# Patient Record
Sex: Male | Born: 1955 | Race: White | Hispanic: No | Smoking: Former smoker
Health system: Southern US, Community
[De-identification: ages and names within clinical notes are randomized; demographics above are authoritative.]

## PROBLEM LIST (undated history)

## (undated) DIAGNOSIS — E785 Hyperlipidemia, unspecified: Secondary | ICD-10-CM

## (undated) DIAGNOSIS — I472 Ventricular tachycardia, unspecified: Secondary | ICD-10-CM

## (undated) DIAGNOSIS — I4729 Other ventricular tachycardia: Secondary | ICD-10-CM

## (undated) DIAGNOSIS — I10 Essential (primary) hypertension: Secondary | ICD-10-CM

## (undated) DIAGNOSIS — Z95810 Presence of automatic (implantable) cardiac defibrillator: Secondary | ICD-10-CM

## (undated) DIAGNOSIS — I255 Ischemic cardiomyopathy: Secondary | ICD-10-CM

## (undated) DIAGNOSIS — I4510 Unspecified right bundle-branch block: Secondary | ICD-10-CM

## (undated) DIAGNOSIS — Z8679 Personal history of other diseases of the circulatory system: Secondary | ICD-10-CM

## (undated) DIAGNOSIS — I493 Ventricular premature depolarization: Secondary | ICD-10-CM

## (undated) DIAGNOSIS — R0600 Dyspnea, unspecified: Secondary | ICD-10-CM

## (undated) DIAGNOSIS — I219 Acute myocardial infarction, unspecified: Secondary | ICD-10-CM

## (undated) DIAGNOSIS — I5022 Chronic systolic (congestive) heart failure: Secondary | ICD-10-CM

## (undated) DIAGNOSIS — F419 Anxiety disorder, unspecified: Secondary | ICD-10-CM

## (undated) DIAGNOSIS — G459 Transient cerebral ischemic attack, unspecified: Secondary | ICD-10-CM

## (undated) DIAGNOSIS — F32A Depression, unspecified: Secondary | ICD-10-CM

## (undated) DIAGNOSIS — F329 Major depressive disorder, single episode, unspecified: Secondary | ICD-10-CM

## (undated) DIAGNOSIS — I2510 Atherosclerotic heart disease of native coronary artery without angina pectoris: Secondary | ICD-10-CM

## (undated) HISTORY — DX: Hyperlipidemia, unspecified: E78.5

## (undated) HISTORY — PX: CARDIAC DEFIBRILLATOR PLACEMENT: SHX171

## (undated) HISTORY — DX: Unspecified right bundle-branch block: I45.10

## (undated) HISTORY — DX: Other ventricular tachycardia: I47.29

## (undated) HISTORY — DX: Essential (primary) hypertension: I10

## (undated) HISTORY — DX: Transient cerebral ischemic attack, unspecified: G45.9

## (undated) HISTORY — DX: Atherosclerotic heart disease of native coronary artery without angina pectoris: I25.10

## (undated) HISTORY — PX: LAPAROSCOPIC CHOLECYSTECTOMY: SUR755

## (undated) HISTORY — DX: Ventricular premature depolarization: I49.3

## (undated) HISTORY — DX: Ventricular tachycardia, unspecified: I47.20

## (undated) HISTORY — DX: Ischemic cardiomyopathy: I25.5

## (undated) HISTORY — PX: HERNIA REPAIR: SHX51

## (undated) HISTORY — DX: Ventricular tachycardia: I47.2

## (undated) HISTORY — DX: Anxiety disorder, unspecified: F41.9

---
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
## 1998-07-11 ENCOUNTER — Encounter: Payer: Self-pay | Admitting: Emergency Medicine

## 1998-07-11 ENCOUNTER — Inpatient Hospital Stay (HOSPITAL_COMMUNITY): Admission: EM | Admit: 1998-07-11 | Discharge: 1998-07-11 | Payer: Self-pay | Admitting: Emergency Medicine

## 2001-02-06 ENCOUNTER — Inpatient Hospital Stay (HOSPITAL_COMMUNITY): Admission: EM | Admit: 2001-02-06 | Discharge: 2001-02-16 | Payer: Self-pay | Admitting: *Deleted

## 2001-02-06 ENCOUNTER — Encounter: Payer: Self-pay | Admitting: *Deleted

## 2001-02-07 ENCOUNTER — Encounter: Payer: Self-pay | Admitting: *Deleted

## 2001-02-08 ENCOUNTER — Encounter: Payer: Self-pay | Admitting: *Deleted

## 2001-02-09 ENCOUNTER — Encounter: Payer: Self-pay | Admitting: *Deleted

## 2001-02-13 ENCOUNTER — Encounter: Payer: Self-pay | Admitting: *Deleted

## 2001-06-17 ENCOUNTER — Observation Stay (HOSPITAL_COMMUNITY): Admission: EM | Admit: 2001-06-17 | Discharge: 2001-06-18 | Payer: Self-pay | Admitting: *Deleted

## 2001-08-16 ENCOUNTER — Ambulatory Visit (HOSPITAL_COMMUNITY): Admission: RE | Admit: 2001-08-16 | Discharge: 2001-08-16 | Payer: Self-pay | Admitting: *Deleted

## 2002-03-04 ENCOUNTER — Inpatient Hospital Stay (HOSPITAL_COMMUNITY): Admission: EM | Admit: 2002-03-04 | Discharge: 2002-03-07 | Payer: Self-pay | Admitting: Emergency Medicine

## 2002-03-04 ENCOUNTER — Encounter: Payer: Self-pay | Admitting: Emergency Medicine

## 2002-03-23 ENCOUNTER — Inpatient Hospital Stay (HOSPITAL_COMMUNITY): Admission: EM | Admit: 2002-03-23 | Discharge: 2002-03-24 | Payer: Self-pay | Admitting: Emergency Medicine

## 2002-04-12 ENCOUNTER — Ambulatory Visit (HOSPITAL_COMMUNITY): Admission: RE | Admit: 2002-04-12 | Discharge: 2002-04-13 | Payer: Self-pay | Admitting: Internal Medicine

## 2002-04-13 ENCOUNTER — Encounter: Payer: Self-pay | Admitting: Internal Medicine

## 2002-04-14 ENCOUNTER — Emergency Department (HOSPITAL_COMMUNITY): Admission: EM | Admit: 2002-04-14 | Discharge: 2002-04-14 | Payer: Self-pay | Admitting: Emergency Medicine

## 2003-11-12 ENCOUNTER — Inpatient Hospital Stay (HOSPITAL_COMMUNITY): Admission: EM | Admit: 2003-11-12 | Discharge: 2003-11-14 | Payer: Self-pay | Admitting: Emergency Medicine

## 2004-01-11 ENCOUNTER — Ambulatory Visit: Payer: Self-pay | Admitting: Cardiology

## 2004-01-31 ENCOUNTER — Ambulatory Visit: Payer: Self-pay | Admitting: Internal Medicine

## 2004-01-31 ENCOUNTER — Ambulatory Visit: Payer: Self-pay

## 2004-03-01 ENCOUNTER — Emergency Department (HOSPITAL_COMMUNITY): Admission: EM | Admit: 2004-03-01 | Discharge: 2004-03-01 | Payer: Self-pay | Admitting: Emergency Medicine

## 2004-04-28 ENCOUNTER — Ambulatory Visit: Payer: Self-pay

## 2004-05-05 ENCOUNTER — Ambulatory Visit: Payer: Self-pay

## 2004-05-13 ENCOUNTER — Ambulatory Visit: Payer: Self-pay | Admitting: Internal Medicine

## 2004-09-16 ENCOUNTER — Inpatient Hospital Stay (HOSPITAL_COMMUNITY): Admission: EM | Admit: 2004-09-16 | Discharge: 2004-09-19 | Payer: Self-pay | Admitting: Emergency Medicine

## 2004-09-16 ENCOUNTER — Ambulatory Visit: Payer: Self-pay | Admitting: Cardiology

## 2004-10-23 ENCOUNTER — Inpatient Hospital Stay (HOSPITAL_COMMUNITY): Admission: AD | Admit: 2004-10-23 | Discharge: 2004-10-24 | Payer: Self-pay | Admitting: Internal Medicine

## 2004-10-23 ENCOUNTER — Ambulatory Visit: Payer: Self-pay | Admitting: Cardiology

## 2004-10-23 ENCOUNTER — Ambulatory Visit: Payer: Self-pay | Admitting: Internal Medicine

## 2004-10-24 ENCOUNTER — Ambulatory Visit: Payer: Self-pay | Admitting: Cardiology

## 2004-11-11 ENCOUNTER — Ambulatory Visit: Payer: Self-pay | Admitting: Cardiology

## 2004-12-31 ENCOUNTER — Ambulatory Visit: Payer: Self-pay | Admitting: Cardiology

## 2005-02-06 ENCOUNTER — Ambulatory Visit: Payer: Self-pay | Admitting: Cardiology

## 2005-04-08 ENCOUNTER — Ambulatory Visit: Payer: Self-pay | Admitting: Internal Medicine

## 2005-07-11 ENCOUNTER — Inpatient Hospital Stay (HOSPITAL_COMMUNITY): Admission: EM | Admit: 2005-07-11 | Discharge: 2005-07-12 | Payer: Self-pay | Admitting: Emergency Medicine

## 2005-07-11 ENCOUNTER — Ambulatory Visit: Payer: Self-pay | Admitting: Cardiology

## 2005-07-17 ENCOUNTER — Ambulatory Visit: Payer: Self-pay | Admitting: Cardiology

## 2005-08-11 ENCOUNTER — Ambulatory Visit: Payer: Self-pay | Admitting: Cardiology

## 2005-08-11 ENCOUNTER — Encounter (HOSPITAL_COMMUNITY): Admission: RE | Admit: 2005-08-11 | Discharge: 2005-11-05 | Payer: Self-pay | Admitting: Cardiology

## 2005-09-29 ENCOUNTER — Ambulatory Visit: Payer: Self-pay | Admitting: Cardiology

## 2005-09-29 ENCOUNTER — Inpatient Hospital Stay (HOSPITAL_COMMUNITY): Admission: EM | Admit: 2005-09-29 | Discharge: 2005-09-29 | Payer: Self-pay | Admitting: Emergency Medicine

## 2005-10-21 ENCOUNTER — Ambulatory Visit: Payer: Self-pay | Admitting: Internal Medicine

## 2005-11-11 ENCOUNTER — Ambulatory Visit: Payer: Self-pay | Admitting: Cardiology

## 2005-12-31 ENCOUNTER — Ambulatory Visit: Payer: Self-pay | Admitting: Cardiology

## 2006-05-07 ENCOUNTER — Ambulatory Visit: Payer: Self-pay | Admitting: Cardiology

## 2006-05-24 ENCOUNTER — Ambulatory Visit: Payer: Self-pay | Admitting: Cardiology

## 2006-05-24 LAB — CONVERTED CEMR LAB
BUN: 12 mg/dL (ref 6–23)
Chloride: 106 meq/L (ref 96–112)
GFR calc non Af Amer: 74 mL/min
Potassium: 3.9 meq/L (ref 3.5–5.1)
Sodium: 139 meq/L (ref 135–145)

## 2006-06-21 ENCOUNTER — Ambulatory Visit: Payer: Self-pay | Admitting: Internal Medicine

## 2006-06-21 LAB — CONVERTED CEMR LAB
ALT: 21 units/L (ref 0–40)
Albumin: 3.8 g/dL (ref 3.5–5.2)
HDL: 34.6 mg/dL — ABNORMAL LOW (ref >39.0)
Triglycerides: 193 mg/dL — ABNORMAL HIGH (ref 0–149)
VLDL: 39 mg/dL (ref 0–40)

## 2006-09-01 ENCOUNTER — Ambulatory Visit: Payer: Self-pay | Admitting: Internal Medicine

## 2006-12-13 ENCOUNTER — Ambulatory Visit: Payer: Self-pay | Admitting: Internal Medicine

## 2007-03-16 ENCOUNTER — Ambulatory Visit: Payer: Self-pay | Admitting: Internal Medicine

## 2007-04-30 ENCOUNTER — Ambulatory Visit: Payer: Self-pay | Admitting: Cardiology

## 2007-04-30 ENCOUNTER — Inpatient Hospital Stay (HOSPITAL_COMMUNITY): Admission: EM | Admit: 2007-04-30 | Discharge: 2007-05-03 | Payer: Self-pay | Admitting: Emergency Medicine

## 2007-05-02 ENCOUNTER — Encounter (INDEPENDENT_AMBULATORY_CARE_PROVIDER_SITE_OTHER): Payer: Self-pay | Admitting: Cardiology

## 2007-05-20 ENCOUNTER — Ambulatory Visit: Payer: Self-pay | Admitting: Cardiology

## 2007-06-08 ENCOUNTER — Ambulatory Visit: Payer: Self-pay | Admitting: Internal Medicine

## 2007-06-08 LAB — CONVERTED CEMR LAB
Calcium: 9.6 mg/dL (ref 8.4–10.5)
Creatinine, Ser: 1.1 mg/dL (ref 0.4–1.5)
GFR calc non Af Amer: 74 mL/min
Glucose, Bld: 92 mg/dL (ref 70–99)
Potassium: 4.3 meq/L (ref 3.5–5.1)
Sodium: 140 meq/L (ref 135–145)

## 2007-06-14 ENCOUNTER — Ambulatory Visit: Payer: Self-pay | Admitting: Cardiology

## 2007-06-14 LAB — CONVERTED CEMR LAB
AST: 21 units/L (ref 0–37)
Albumin: 3.8 g/dL (ref 3.5–5.2)
Bilirubin, Direct: 0.1 mg/dL (ref 0.0–0.3)
Calcium: 9.3 mg/dL (ref 8.4–10.5)
Chloride: 109 meq/L (ref 96–112)
GFR calc non Af Amer: 83 mL/min
Glucose, Bld: 100 mg/dL — ABNORMAL HIGH (ref 70–99)
HDL: 40.8 mg/dL (ref >39.0)
Potassium: 4.2 meq/L (ref 3.5–5.1)
Total Bilirubin: 0.7 mg/dL (ref 0.3–1.2)
Total CHOL/HDL Ratio: 4

## 2008-01-10 ENCOUNTER — Ambulatory Visit: Payer: Self-pay | Admitting: Internal Medicine

## 2008-02-24 ENCOUNTER — Ambulatory Visit: Payer: Self-pay | Admitting: *Deleted

## 2008-02-24 ENCOUNTER — Inpatient Hospital Stay (HOSPITAL_COMMUNITY): Admission: EM | Admit: 2008-02-24 | Discharge: 2008-02-25 | Payer: Self-pay | Admitting: Emergency Medicine

## 2008-02-29 ENCOUNTER — Ambulatory Visit: Payer: Self-pay | Admitting: Cardiology

## 2008-03-27 ENCOUNTER — Encounter: Payer: Self-pay | Admitting: Internal Medicine

## 2008-04-10 ENCOUNTER — Ambulatory Visit: Payer: Self-pay | Admitting: Internal Medicine

## 2008-06-04 DIAGNOSIS — I255 Ischemic cardiomyopathy: Secondary | ICD-10-CM | POA: Insufficient documentation

## 2008-06-04 DIAGNOSIS — F411 Generalized anxiety disorder: Secondary | ICD-10-CM

## 2008-06-04 DIAGNOSIS — I10 Essential (primary) hypertension: Secondary | ICD-10-CM

## 2008-06-04 DIAGNOSIS — E785 Hyperlipidemia, unspecified: Secondary | ICD-10-CM

## 2008-06-04 DIAGNOSIS — R079 Chest pain, unspecified: Secondary | ICD-10-CM | POA: Insufficient documentation

## 2008-07-09 ENCOUNTER — Encounter (INDEPENDENT_AMBULATORY_CARE_PROVIDER_SITE_OTHER): Payer: Self-pay | Admitting: *Deleted

## 2008-07-16 ENCOUNTER — Telehealth: Payer: Self-pay | Admitting: Cardiology

## 2008-08-13 ENCOUNTER — Telehealth: Payer: Self-pay | Admitting: Internal Medicine

## 2008-08-21 ENCOUNTER — Ambulatory Visit: Payer: Self-pay | Admitting: Internal Medicine

## 2008-08-21 DIAGNOSIS — I472 Ventricular tachycardia: Secondary | ICD-10-CM

## 2008-09-03 ENCOUNTER — Telehealth: Payer: Self-pay | Admitting: Cardiology

## 2008-09-07 ENCOUNTER — Encounter: Payer: Self-pay | Admitting: Internal Medicine

## 2008-09-07 ENCOUNTER — Ambulatory Visit (HOSPITAL_COMMUNITY): Admission: RE | Admit: 2008-09-07 | Discharge: 2008-09-07 | Payer: Self-pay | Admitting: Internal Medicine

## 2008-09-10 ENCOUNTER — Encounter: Payer: Self-pay | Admitting: Internal Medicine

## 2008-09-27 ENCOUNTER — Ambulatory Visit: Payer: Self-pay | Admitting: Internal Medicine

## 2008-09-28 LAB — CONVERTED CEMR LAB
BUN: 15 mg/dL (ref 6–23)
CO2: 30 meq/L (ref 19–32)
Creatinine, Ser: 1.1 mg/dL (ref 0.4–1.5)
GFR calc non Af Amer: 73.71 mL/min (ref >60)
HCT: 39.8 % (ref 39.0–52.0)
Potassium: 4.9 meq/L (ref 3.5–5.1)
RDW: 12.9 % (ref 11.5–14.6)
Sodium: 141 meq/L (ref 135–145)
WBC: 5.6 10*3/uL (ref 4.5–10.5)

## 2008-10-03 ENCOUNTER — Ambulatory Visit (HOSPITAL_COMMUNITY): Admission: RE | Admit: 2008-10-03 | Discharge: 2008-10-03 | Payer: Self-pay | Admitting: Internal Medicine

## 2008-10-03 ENCOUNTER — Ambulatory Visit: Payer: Self-pay | Admitting: Internal Medicine

## 2008-10-04 ENCOUNTER — Encounter: Payer: Self-pay | Admitting: Internal Medicine

## 2008-10-22 ENCOUNTER — Ambulatory Visit: Payer: Self-pay

## 2008-10-22 ENCOUNTER — Encounter: Payer: Self-pay | Admitting: Internal Medicine

## 2009-01-14 ENCOUNTER — Telehealth: Payer: Self-pay | Admitting: Cardiology

## 2009-02-04 ENCOUNTER — Encounter: Payer: Self-pay | Admitting: Internal Medicine

## 2009-02-05 ENCOUNTER — Ambulatory Visit: Payer: Self-pay | Admitting: Internal Medicine

## 2009-05-07 ENCOUNTER — Ambulatory Visit: Payer: Self-pay | Admitting: Internal Medicine

## 2009-05-09 ENCOUNTER — Encounter: Payer: Self-pay | Admitting: Internal Medicine

## 2009-05-15 ENCOUNTER — Encounter: Payer: Self-pay | Admitting: Internal Medicine

## 2009-07-01 ENCOUNTER — Telehealth: Payer: Self-pay | Admitting: Cardiology

## 2009-08-09 ENCOUNTER — Encounter: Payer: Self-pay | Admitting: Internal Medicine

## 2009-09-26 ENCOUNTER — Telehealth: Payer: Self-pay | Admitting: Cardiology

## 2009-11-13 ENCOUNTER — Telehealth: Payer: Self-pay | Admitting: Cardiology

## 2009-11-22 ENCOUNTER — Encounter (INDEPENDENT_AMBULATORY_CARE_PROVIDER_SITE_OTHER): Payer: Self-pay | Admitting: *Deleted

## 2010-02-04 ENCOUNTER — Ambulatory Visit: Payer: Self-pay | Admitting: Cardiology

## 2010-02-04 ENCOUNTER — Encounter: Payer: Self-pay | Admitting: Cardiology

## 2010-02-04 DIAGNOSIS — F17200 Nicotine dependence, unspecified, uncomplicated: Secondary | ICD-10-CM

## 2010-02-06 LAB — CONVERTED CEMR LAB
ALT: 23 units/L (ref 0–53)
BUN: 17 mg/dL (ref 6–23)
Bilirubin, Direct: 0 mg/dL (ref 0.0–0.3)
Calcium: 9.3 mg/dL (ref 8.4–10.5)
Chloride: 104 meq/L (ref 96–112)
Cholesterol: 243 mg/dL — ABNORMAL HIGH (ref 0–200)
Creatinine, Ser: 1.2 mg/dL (ref 0.4–1.5)
GFR calc non Af Amer: 66.99 mL/min (ref >60.00)
HDL: 42.3 mg/dL (ref >39.00)
Sodium: 138 meq/L (ref 135–145)
Total Protein: 7.4 g/dL (ref 6.0–8.3)

## 2010-03-16 ENCOUNTER — Encounter: Payer: Self-pay | Admitting: Cardiology

## 2010-03-25 ENCOUNTER — Ambulatory Visit
Admission: RE | Admit: 2010-03-25 | Discharge: 2010-03-25 | Payer: Self-pay | Source: Home / Self Care | Attending: Internal Medicine | Admitting: Internal Medicine

## 2010-03-25 ENCOUNTER — Encounter: Payer: Self-pay | Admitting: Internal Medicine

## 2010-03-25 NOTE — Letter (Signed)
Summary: Device-Delinquent Phone Journalist, newspaper, Main Office  [address]   [place], [place] [address]   Phone: [phone]  Fax: [phone]     August 09, 2009 MRN: [id number]   Ronit Hustead [address] [place], [place]  [address]   Dear Mr. RODRIGUE,  According to our records, you were scheduled for a device phone transmission on 08-06-2009.     We did not receive any results from this check.  If you transmitted on your scheduled day, please call us to help troubleshoot your system.  If you forgot to send your transmission, please send one upon receipt of this letter.  Thank you,   Architectural technologist [facility]

## 2010-03-25 NOTE — Letter (Signed)
Summary: Remote Device Check  [employer], Main Office  [address]   [place], [place] [address]   Phone: [phone]  Fax: [phone]     May 15, 2009 MRN: [id number]   [name] [address] [place], [place]  [address]   Dear Mr. [name],   Your remote transmission was recieved and reviewed by your physician.  All diagnostics were within normal limits for you.  __X___Your next transmission is scheduled for:    August 06, 2009.  Please transmit at any time this day.  If you have a wireless device your transmission will be sent automatically.      Sincerely,  Proofreader

## 2010-03-25 NOTE — Letter (Signed)
Summary: Device-Delinquent Check  [facility], Main Office  [address]   [place], [place] [address]   Phone: [phone]  Fax: [phone]     November 22, 2009 MRN: [id number]   [name] [address] [place], [place]  [address]   Dear Mr. TA,  According to our records, you have not had your implanted device checked in the recommended period of time.  We are unable to determine appropriate device function without checking your device on a regular basis.  Please call our office to schedule an appointment  with Dr Graciela Husbands, as soon as possible.  If you are having your device checked by another physician, please call us so that we may update our records.  Thank you,   Architectural technologist [facility]

## 2010-03-25 NOTE — Progress Notes (Signed)
Summary: Calling regarding Plavis  Phone Note Call from Patient Call back at Home Phone [phone] Call back at [phone]   Caller: Patient Summary of Call: Pt calling regarding medication Plavix Initial call taken by: Judie Grieve,  Jul 01, 2009 4:16 PM  Follow-up for Phone Call        spoke with pt, plavix ordered from [facility] Deliah Goody, RN  Jul 01, 2009 6:22 PM      Appended Document: Calling regarding Plavis plavix received from [facility] pt aware to pick up at the front desk

## 2010-03-25 NOTE — Progress Notes (Signed)
Summary: pt needs refill  Phone Note Refill Request Call back at Home Phone [phone] Message from:  Patient on [facility] in [place] [phone]  Refills Requested: Medication #1:  CARVEDILOL 12.5 MG TABS take two times a day  Medication #2:  LASIX 20 MG TABS Take 1 tablet by mouth once a day  Medication #3:  LISINOPRIL 20 MG TABS 1 tab once daily simvastatin needs to be called into [facility] [address] in [place]  Initial call taken by: Omer Jack,  November 13, 2009 3:43 PM  Follow-up for Phone Call        called meds in to pharmacy gave pt 12 refills on all meds and called pt to let him know these was done for him Follow-up by: Kem Parkinson,  November 13, 2009 4:36 PM

## 2010-03-25 NOTE — Progress Notes (Signed)
Summary: refill meds  Phone Note Refill Request Call back at Home Phone [phone] Message from:  Patient on September 26, 2009 4:59 PM  Refills Requested: Medication #1:  NITROGLYCERIN 0.4 MG SUBL One tablet under tongue every 5 minutes as needed for chest pain---may repeat times three [facility] [place] [phone]   Method Requested: Fax to Local Pharmacy Initial call taken by: Lorne Skeens,  September 26, 2009 5:00 PM    Prescriptions: NITROGLYCERIN 0.4 MG SUBL (NITROGLYCERIN) One tablet under tongue every 5 minutes as needed for chest pain---may repeat times three  #25 x 12   Entered by:   Kem Parkinson   Authorized by:   Ferman Hamming, MD, [facility]   Signed by:   Kem Parkinson on 09/27/2009   Method used:   Electronically to        [facility] [phone]* (retail)       [address]       [place], [place]  [address]       Ph: [phone] or [phone]       Fax: [phone]   RxID:   [id number] NITROGLYCERIN 0.4 MG SUBL (NITROGLYCERIN) One tablet under tongue every 5 minutes as needed for chest pain---may repeat times three  #30 x 12   Entered by:   Kem Parkinson   Authorized by:   Ferman Hamming, MD, [facility]   Signed by:   Kem Parkinson on 09/27/2009   Method used:   Electronically to        [facility] [address] (retail)       [address]       [place], [place]  [address]       Ph: [phone] or [phone]       Fax: [phone]   RxID:   [id number]

## 2010-03-25 NOTE — Cardiovascular Report (Signed)
Summary: Office Visit Remote  Office Visit Remote   Imported By: Roderic Ovens 05/15/2009 16:18:30  _____________________________________________________________________  External Attachment:    Type:   Image     Comment:   External Document

## 2010-03-27 NOTE — Assessment & Plan Note (Signed)
Summary: rov per spouse call/lg   Referring Provider:  brian crenshaw   History of Present Illness: [name] is a pleasant gentleman who has a history of coronary artery disease, ischemic cardiomyopathy, and history of ICD.  His most recent cardiac catheterization was performed in March 2009.  At that time, he was found to have an 80% stenosis in the proximal LAD.  There is a second diagonal with a 70% ostial stenosis.  There was a 50% circumflex and a small first OM branch had an 80-90% stenosis.  There was a 50% distal stenosis in the RCA, but the previous stent was patent.  He had a PCI of his LAD at that time with a bare-metal stent.  Note, the ejection fraction is 30-35%. I last saw him in January of 2010. Since then, the patient denies any dyspnea on exertion, orthopnea, PND, pedal edema, palpitations, syncope or chest pain.    Impression & Recommendations:  Problem # 1:  ICD - MDT MASTER STUDY (ICD-V45.02) Management of ICD per electrophysiology.  Problem # 2:  CARDIOMYOPATHY, ISCHEMIC (ICD-414.8) Continue ACE inhibitor, beta blocker and diuretic. Check potassium and renal function. His updated medication list for this problem includes:    Carvedilol 12.5 Mg Tabs (Carvedilol) .[name]... Take two times a day    Lasix 20 Mg Tabs (Furosemide) .[name]... Take 1 tablet by mouth once a day    Plavix 75 Mg Tabs (Clopidogrel bisulfate) .[name]... Take 1 tablet by mouth once a day    Nitroglycerin 0.4 Mg Subl (Nitroglycerin) ..... One tablet under tongue every 5 minutes as needed for chest pain---may repeat times three    Lisinopril 20 Mg Tabs (Lisinopril) .[name]... 1 tab once daily    Aspirin Ec 325 Mg Tbec (Aspirin) .[name]... Take one tablet by mouth daily  Problem # 3:  HYPERTENSION, UNSPECIFIED (ICD-401.9)  His updated medication list for this problem includes:    Carvedilol 12.5 Mg Tabs (Carvedilol) .[name]... Take two times a day    Lasix 20 Mg Tabs (Furosemide) .[name]... Take 1 tablet by mouth once a day  Lisinopril 20 Mg Tabs (Lisinopril) .[name]... 1 tab once daily    Aspirin Ec 325 Mg Tbec (Aspirin) .[name]... Take one tablet by mouth daily  Orders: TLB-BMP (Basic Metabolic Panel-BMET) (80048-METABOL)  His updated medication list for this problem includes:    Carvedilol 12.5 Mg Tabs (Carvedilol) .[name]... Take two times a day    Lasix 20 Mg Tabs (Furosemide) .[name]... Take 1 tablet by mouth once a day    Lisinopril 20 Mg Tabs (Lisinopril) .[name]... 1 tab once daily    Aspirin Ec 325 Mg Tbec (Aspirin) .[name]... Take one tablet by mouth daily  Problem # 4:  HYPERLIPIDEMIA-MIXED (ICD-272.4) Continue statin. Check CK, lipids and liver. His updated medication list for this problem includes:    Simvastatin 80 Mg Tabs (Simvastatin) .[name]... 1 tab once daily  Orders: TLB-CK Total Only(Creatine Kinase/CPK) (82550-CK) TLB-Lipid Panel (80061-LIPID) TLB-Hepatic/Liver Function Pnl (80076-HEPATIC)  Problem # 5:  CAD (ICD-414.00) Continue aspirin continue Plavix. Continue beta blocker, ACE inhibitor and statin. The following medications were removed from the medication list:    Plavix 75 Mg Tabs (Clopidogrel bisulfate) .[name]... Take 1 tablet by mouth once a day His updated medication list for this problem includes:    Carvedilol 12.5 Mg Tabs (Carvedilol) .[name]... Take two times a day    Nitroglycerin 0.4 Mg Subl (Nitroglycerin) ..... One tablet under tongue every 5 minutes as needed for chest pain---may repeat times three  Lisinopril 20 Mg Tabs (Lisinopril) .[name]... 1 tab once daily    Aspirin Ec 325 Mg Tbec (Aspirin) .[name]... Take one tablet by mouth daily  Problem # 6:  TOBACCO ABUSE (ICD-305.1) Patient counseled on discontinuing.   Current Medications (verified): 1)  Carvedilol 12.5 Mg Tabs (Carvedilol) .... Take Two Times A Day 2)  Lasix 20 Mg Tabs (Furosemide) .... Take 1 Tablet By Mouth Once A Day 3)  Simvastatin 80 Mg Tabs (Simvastatin) .[name].. 1 Tab Once Daily 4)  Plavix 75 Mg Tabs (Clopidogrel Bisulfate) .... Take 1  Tablet By Mouth Once A Day 5)  Nitroglycerin 0.4 Mg Subl (Nitroglycerin) .... One Tablet Under Tongue Every 5 Minutes As Needed For Chest Pain---May Repeat Times Three 6)  Lisinopril 20 Mg Tabs (Lisinopril) .[name].. 1 Tab Once Daily 7)  Aspirin Ec 325 Mg Tbec (Aspirin) .... Take One Tablet By Mouth Daily  Allergies: No Known Drug Allergies  Past History:  Past Medical History: ICD - IN SITU (ICD-V45.02) [employer] HYPERTENSION, UNSPECIFIED (ICD-401.9) HYPERLIPIDEMIA-MIXED (ICD-272.4) CARDIOMYOPATHY, ISCHEMIC (ICD-414.8) CAD, UNSPECIFIED SITE (ICD-414.00) ANXIETY (ICD-300.00)    Past Surgical History: Reviewed history from 02/04/2009 and no changes required. Laparoscopic cholecystectomy. Defibrillator implantation with primary prevention for sudden       cardiac death, (Master-P). -- [name], M.D. - 04/12/2002 Explantation of a previously implanted device.-2010 Implantation of a new device--[employer]  Social History: Reviewed history from 06/04/2008 and no changes required. Full Time Married  Tobacco Use - Yes.  Alcohol Use - yes -- ocassional Drug Use - no  Review of Systems       no fevers or chills, productive cough, hemoptysis, dysphasia, odynophagia, melena, hematochezia, dysuria, hematuria, rash, seizure activity, orthopnea, PND, pedal edema, claudication. Remaining systems are negative.   Vital Signs:  Patient profile:   55 year old male Height:      71 inches Weight:      233 pounds BMI:     32.61 Pulse rate:   94 / minute Resp:     14 per minute BP sitting:   112 / 72  (left arm)  Vitals Entered By: Kem Parkinson (February 04, 2010 4:07 PM)  Physical Exam  General:  Well-developed well-nourished in no acute distress.  Skin is warm and dry.  HEENT is normal.  Neck is supple. No thyromegaly.  Chest is clear to auscultation with normal expansion.  Cardiovascular exam is regular rate and rhythm.  Abdominal exam nontender or  distended. No masses palpated. Extremities show no edema. neuro grossly intact    EKG  Procedure date:  02/04/2010  Findings:      Sinus rhythm, left axis deviation, nonspecific ST changes.   ICD Specifications Following MD:  Sherryl Manges, MD     ICD Vendor:  Medtronic     ICD Model Number:  [id number]     ICD Serial Number:  [id number] ICD DOI:  10/03/2008     ICD Implanting MD:  Sherryl Manges, MD  Lead 1:    Location: RV     DOI: 04/12/2002     Model #: [id number]     Serial #: [id number]     Status: active  Indications::  ICM  Explantation Comments: 10/03/2008  Maximo [id number] S explanted  ICD Follow Up ICD Dependent:  No      Episodes Coumadin:  No  Brady Parameters Mode VVI     Lower Rate Limit:  40      Tachy Zones VF:  200  VT:  167     Impression & Recommendations:  Problem # 1:  ICD - MDT MASTER STUDY (ICD-V45.02) Management of ICD per electrophysiology.  Problem # 2:  CARDIOMYOPATHY, ISCHEMIC (ICD-414.8) Continue ACE inhibitor, beta blocker and diuretic. Check potassium and renal function. His updated medication list for this problem includes:    Carvedilol 12.5 Mg Tabs (Carvedilol) .[name]... Take two times a day    Lasix 20 Mg Tabs (Furosemide) .[name]... Take 1 tablet by mouth once a day    Plavix 75 Mg Tabs (Clopidogrel bisulfate) .[name]... Take 1 tablet by mouth once a day    Nitroglycerin 0.4 Mg Subl (Nitroglycerin) ..... One tablet under tongue every 5 minutes as needed for chest pain---may repeat times three    Lisinopril 20 Mg Tabs (Lisinopril) .[name]... 1 tab once daily    Aspirin Ec 325 Mg Tbec (Aspirin) .[name]... Take one tablet by mouth daily  Problem # 3:  HYPERTENSION, UNSPECIFIED (ICD-401.9)  His updated medication list for this problem includes:    Carvedilol 12.5 Mg Tabs (Carvedilol) .[name]... Take two times a day    Lasix 20 Mg Tabs (Furosemide) .[name]... Take 1 tablet by mouth once a day    Lisinopril 20 Mg Tabs (Lisinopril) .[name]... 1 tab once daily    Aspirin Ec 325  Mg Tbec (Aspirin) .[name]... Take one tablet by mouth daily  Orders: TLB-BMP (Basic Metabolic Panel-BMET) (80048-METABOL)  His updated medication list for this problem includes:    Carvedilol 12.5 Mg Tabs (Carvedilol) .[name]... Take two times a day    Lasix 20 Mg Tabs (Furosemide) .[name]... Take 1 tablet by mouth once a day    Lisinopril 20 Mg Tabs (Lisinopril) .[name]... 1 tab once daily    Aspirin Ec 325 Mg Tbec (Aspirin) .[name]... Take one tablet by mouth daily  Problem # 4:  HYPERLIPIDEMIA-MIXED (ICD-272.4) Continue statin. Check CK, lipids and liver. His updated medication list for this problem includes:    Simvastatin 80 Mg Tabs (Simvastatin) .[name]... 1 tab once daily  Orders: TLB-CK Total Only(Creatine Kinase/CPK) (82550-CK) TLB-Lipid Panel (80061-LIPID) TLB-Hepatic/Liver Function Pnl (80076-HEPATIC)  Problem # 5:  CAD (ICD-414.00) Continue aspirin continue Plavix. Continue beta blocker, ACE inhibitor and statin. The following medications were removed from the medication list:    Plavix 75 Mg Tabs (Clopidogrel bisulfate) .[name]... Take 1 tablet by mouth once a day His updated medication list for this problem includes:    Carvedilol 12.5 Mg Tabs (Carvedilol) .[name]... Take two times a day    Nitroglycerin 0.4 Mg Subl (Nitroglycerin) ..... One tablet under tongue every 5 minutes as needed for chest pain---may repeat times three    Lisinopril 20 Mg Tabs (Lisinopril) .[name]... 1 tab once daily    Aspirin Ec 325 Mg Tbec (Aspirin) .[name]... Take one tablet by mouth daily  Problem # 6:  TOBACCO ABUSE (ICD-305.1) Patient counseled on discontinuing.  Patient Instructions: 1)  Your physician wants you to follow-up in: ONE YEAR  You will receive a reminder letter in the mail two months in advance. If you don't receive a letter, please call our office to schedule the follow-up appointment. 2)  Your physician has recommended you make the following change in your medication: STOP PLAVIX

## 2010-04-02 NOTE — Assessment & Plan Note (Signed)
Summary: pc2   Visit Type:  Pacemaker check Referring Provider:  brian crenshaw  CC:  a liitle sbob.  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    Then a catheterization in March of 09 demonstrated ejection fraction of 35%   he's had no problems and currently with chest pain or shortness of breath. He has had no arrhythmias in which he is aware and no palpitations.  He had some tranisent SOB lst week, but resolved fully,  no assoc cp  still smoking  Current Medications (verified): 1)  Carvedilol 12.5 Mg Tabs (Carvedilol) .... Take Two Times A Day 2)  Lasix 20 Mg Tabs (Furosemide) .... Take 1 Tablet By Mouth Once A Day 3)  Simvastatin 80 Mg Tabs (Simvastatin) .[name].. 1 Tab Once Daily 4)  Nitroglycerin 0.4 Mg Subl (Nitroglycerin) .... One Tablet Under Tongue Every 5 Minutes As Needed For Chest Pain---May Repeat Times Three 5)  Lisinopril 20 Mg Tabs (Lisinopril) .[name].. 1 Tab Once Daily 6)  Aspirin Ec 325 Mg Tbec (Aspirin) .... Take One Tablet By Mouth Daily  Allergies (verified): No Known Drug Allergies  Past History:  Past Medical History: Last updated: 02/04/2010 ICD - IN SITU (ICD-V45.02) [employer] HYPERTENSION, UNSPECIFIED (ICD-401.9) HYPERLIPIDEMIA-MIXED (ICD-272.4) CARDIOMYOPATHY, ISCHEMIC (ICD-414.8) CAD, UNSPECIFIED SITE (ICD-414.00) ANXIETY (ICD-300.00)    Vital Signs:  Patient profile:   55 year old male Height:      71 inches Weight:      232.50 pounds BMI:     32.54 Pulse rate:   76 / minute BP sitting:   118 / 70  (left arm) Cuff size:   large  Vitals Entered By: Micki Riley CNA (March 25, 2010 11:57 AM)  Physical Exam  General:  The patient was alert and oriented in no acute distress. HEENT Normal.    Lungs were clear.  Heart sounds were regular without murmurs or gallops.  Abdomen was soft with active  bowel sounds. There is no clubbing cyanosis or edema. Skin Warm and dry     ICD Specifications Following MD:  Sherryl Manges, MD     ICD Vendor:  Medtronic     ICD Model Number:  [id number]     ICD Serial Number:  [id number] ICD DOI:  10/03/2008     ICD Implanting MD:  Sherryl Manges, MD  Lead 1:    Location: RV     DOI: 04/12/2002     Model #: [id number]     Serial #: [id number]     Status: active  Indications::  ICM  Explantation Comments: 10/03/2008  Maximo [id number] S explanted  ICD Follow Up Remote Check?  No Battery Voltage:  3.18 V     Charge Time:  8.5 seconds     Battery Est. Longevity:  sr ICD Dependent:  No       ICD Device Measurements Right Ventricle:  Amplitude: 7.8 mV, Impedance: 475 ohms, Threshold: 1.0 V at 0.4 msec Shock Impedance: 48/63 ohms   Episodes Coumadin:  No Shock:  0     ATP:  0     Nonsustained:  457     Ventricular Pacing:  <0.1%  Brady Parameters Mode VVI     Lower Rate Limit:  40      Tachy Zones VF:  200     VT:  167     Next Remote Date:  06/26/2010  Next Cardiology Appt Due:  02/24/2011 Tech Comments:  No parameter changes.  Device function normal.  457 NSVT episodes probable ST/SVT.  [facility] transmissions every 3 months. ROV 1 year with Dr.Teriana Danker. Altha Harm, LPN  March 25, 2010 12:05 PM   Impression & Recommendations:  Problem # 1:  TOBACCO ABUSE (ICD-305.1) we spent about 7 minutes discussing stategies;  he is down to 1/2 p/day   using electonic cig.  will consider the patches  Problem # 2:  VENTRICULAR TACHYCARDIA (ICD-427.1) no recurrent  His updated medication list for this problem includes:    Carvedilol 12.5 Mg Tabs (Carvedilol) .[name]... Take two times a day    Nitroglycerin 0.4 Mg Subl (Nitroglycerin) ..... One tablet under tongue every 5 minutes as needed for chest pain---may repeat times three    Lisinopril 20 Mg Tabs (Lisinopril) .[name]... 1 tab once daily    Aspirin Ec 325 Mg Tbec (Aspirin) .[name]... Take one tablet by mouth  daily  Problem # 3:  CARDIOMYOPATHY, ISCHEMIC (ICD-414.8) stable on current eds.  consdier aldactone His updated medication list for this problem includes:    Carvedilol 12.5 Mg Tabs (Carvedilol) .[name]... Take two times a day    Lasix 20 Mg Tabs (Furosemide) .[name]... Take 1 tablet by mouth once a day    Nitroglycerin 0.4 Mg Subl (Nitroglycerin) ..... One tablet under tongue every 5 minutes as needed for chest pain---may repeat times three    Lisinopril 20 Mg Tabs (Lisinopril) .[name]... 1 tab once daily    Aspirin Ec 325 Mg Tbec (Aspirin) .[name]... Take one tablet by mouth daily  Problem # 4:  HYPERLIPIDEMIA-MIXED (ICD-272.4) will decrease his simvastatin to 40 mg day His updated mediwiation list for this problem includes:    Simvastatin 80 Mg Tabs (Simvastatin) .[name]... 1 tab once daily  Problem # 5:  ICD - MDT (ICD-V45.02) Device parameters and data were reviewed and no changes were made   Patient Instructions: 1)  Your physician has recommended you make the following change in your medication: TAKE 1/2 TAB OF SIMVASTATIN 2)  Your physician wants you to follow-up WU:XLKG WITH DR Logan Bores will receive a reminder letter in the mail two months in advance. If you don't receive a letter, please call our office to schedule the follow-up appointment.

## 2010-04-10 NOTE — Cardiovascular Report (Signed)
Summary: Office Visit   Office Visit   Imported By: Roderic Ovens 04/04/2010 11:23:21  _____________________________________________________________________  External Attachment:    Type:   Image     Comment:   External Document

## 2010-06-09 LAB — CBC
HCT: 39.5 % (ref 39.0–52.0)
MCHC: 33.7 g/dL (ref 30.0–36.0)
MCHC: 34.1 g/dL (ref 30.0–36.0)
MCV: 89.8 fL (ref 78.0–100.0)
MCV: 90.1 fL (ref 78.0–100.0)
Platelets: 232 10*3/uL (ref 150–400)
RBC: 4.33 MIL/uL (ref 4.22–5.81)
RBC: 4.38 MIL/uL (ref 4.22–5.81)
RDW: 13.9 % (ref 11.5–15.5)
RDW: 14.1 % (ref 11.5–15.5)
WBC: 6.8 10*3/uL (ref 4.0–10.5)

## 2010-06-09 LAB — COMPREHENSIVE METABOLIC PANEL
Alkaline Phosphatase: 33 U/L — ABNORMAL LOW (ref 39–117)
CO2: 27 mEq/L (ref 19–32)
Calcium: 9.4 mg/dL (ref 8.4–10.5)
Creatinine, Ser: 0.92 mg/dL (ref 0.4–1.5)
GFR calc Af Amer: >60 mL/min (ref >60)
GFR calc non Af Amer: >60 mL/min (ref >60)
Glucose, Bld: 92 mg/dL (ref 70–99)
Potassium: 4.1 mEq/L (ref 3.5–5.1)
Sodium: 136 mEq/L (ref 135–145)

## 2010-06-09 LAB — LIPID PANEL
Cholesterol: 159 mg/dL (ref 0–200)
HDL: 41 mg/dL (ref >39)
LDL Cholesterol: 105 mg/dL — ABNORMAL HIGH (ref 0–99)

## 2010-06-09 LAB — CK TOTAL AND CKMB (NOT AT ARMC)
CK, MB: 2 ng/mL (ref 0.3–4.0)
Relative Index: 1 (ref 0.0–2.5)

## 2010-06-09 LAB — BASIC METABOLIC PANEL
BUN: 13 mg/dL (ref 6–23)
CO2: 27 mEq/L (ref 19–32)
Chloride: 104 mEq/L (ref 96–112)
GFR calc non Af Amer: >60 mL/min (ref >60)
Glucose, Bld: 103 mg/dL — ABNORMAL HIGH (ref 70–99)

## 2010-06-09 LAB — DIFFERENTIAL
Basophils Absolute: 0 10*3/uL (ref 0.0–0.1)
Basophils Relative: 1 % (ref 0–1)
Eosinophils Relative: 4 % (ref 0–5)
Monocytes Absolute: 0.5 10*3/uL (ref 0.1–1.0)
Neutrophils Relative %: 61 % (ref 43–77)

## 2010-06-09 LAB — CARDIAC PANEL(CRET KIN+CKTOT+MB+TROPI)
CK, MB: 1.6 ng/mL (ref 0.3–4.0)
CK, MB: 2 ng/mL (ref 0.3–4.0)
Relative Index: 1.3 (ref 0.0–2.5)
Total CK: 136 U/L (ref 7–232)

## 2010-06-09 LAB — PROTIME-INR: INR: 0.9 (ref 0.00–1.49)

## 2010-06-09 LAB — HEPARIN LEVEL (UNFRACTIONATED): Heparin Unfractionated: 0.53 IU/mL (ref 0.30–0.70)

## 2010-06-26 ENCOUNTER — Encounter: Payer: Self-pay | Admitting: *Deleted

## 2010-06-29 ENCOUNTER — Encounter: Payer: Self-pay | Admitting: *Deleted

## 2010-07-07 ENCOUNTER — Other Ambulatory Visit: Payer: Self-pay

## 2010-07-07 ENCOUNTER — Ambulatory Visit (INDEPENDENT_AMBULATORY_CARE_PROVIDER_SITE_OTHER): Payer: Self-pay | Admitting: *Deleted

## 2010-07-07 DIAGNOSIS — I472 Ventricular tachycardia: Secondary | ICD-10-CM

## 2010-07-07 DIAGNOSIS — I509 Heart failure, unspecified: Secondary | ICD-10-CM

## 2010-07-08 NOTE — Cardiovascular Report (Signed)
NAMELY, BACCHI NO.:  [id number]   MEDICAL RECORD NO.:  [id number]          PATIENT TYPE:  INP   LOCATION:  1823                         FACILITY:  [facility]   PHYSICIAN:  Veverly Fells. Excell Seltzer, MD  DATE OF BIRTH:  12-11-1955   DATE OF PROCEDURE:  04/30/2007  DATE OF DISCHARGE:                            CARDIAC CATHETERIZATION   PROCEDURE:  Left heart catheterization, selective coronary angiography,  left ventricular angiography, IVUS of the LAD, PTCA and stenting of the  LAD, Angio-Seal the right femoral artery.   INDICATIONS:  Mr. Grape is a 55 year old gentleman who presented with  ongoing chest pain and dynamic EKG changes.  His EKG was borderline with  anterior ST elevation.  He has previous bare metal stent in the LAD from  2002 and a drug-eluting stent in the right coronary artery from 2006.  He was brought emergently for cardiac cath.   Risks and indications of procedure were reviewed with the patient.  Emergency consent was obtained.  The right groin was prepped, draped,  anesthetized with 1% lidocaine using modified Seldinger technique.  A 6-  French sheath was placed in the right femoral artery.  Standard 6-French  Judkins catheters were used for coronary angiography.  An angled pigtail  catheter was used for left ventriculography.  There was a filling defect  in the proximal LAD suggestive of thrombus.  This was just proximal to  the stented segment of LAD.  Heparin and Integrilin were used for  anticoagulation.  A 6-French XB LAD 4-cm guide catheter was inserted.  A  cougar guidewire was passed into the distal LAD.  I elected to perform  intravascular ultrasound to better assess the area.  IVUS was performed  using an automated pullback.  This demonstrated what appeared to be  thrombus just proximal to the stented segment of LAD.  I elected to  primarily stent the area with a 3.5 x 16-mm Liberty stent which was  deployed at 14 atmospheres.  The stent  was well expanded.  It was  deployed in overlapping fashion with the other stent.  The area was then  postdilated with a 3.75 x 12-mm Quantum Maverick balloon which was  inflated to 20 atmospheres on two inflations covering the overlapped  area as well as the proximal stent.  At completion of procedure, the  stent was widely expanded with no residual filling defect and TIMI III  flow in the vessel.  The patient tolerated the procedure well and had no  immediate complications.  An Angio-Seal device was used to close the  femoral arteriotomy.   FINDINGS:  Left ventricular pressure 106/19.  Aortic pressure 109/70  with mean of 88.   CORONARY ANGIOGRAPHY:  Left mainstem is angiographically normal and  bifurcates into the LAD and left circumflex.   The LAD is a large-caliber vessel that courses down and wraps around the  LV apex.  The stent in the proximal edge appears to have a filling  defect present.  This produces an 80% proximal stenosis in the vessel.  The LAD then courses down.  There  is a 50% lesion downstream in the  distal vessel.  There is a large second diagonal branch that has a 70%  ostial stenosis.  There is TIMI III flow in the LAD and diagonal branch.   The left circumflex is a very large vessel.  It is codominant with the  RCA.  The proximal circumflex has a very irregular 50% stenosis.  There  is a small first OM branch that has an 80-90% stenosis present.  The  second OM branch is large and has no significant stenosis.  The  remaining AV groove circumflex has no significant disease and supplies a  left PDA and left posterolateral branch.   The right coronary artery is a moderate size.  There is a widely patent  stent in the midportion.  There is 30% proximal stenosis followed by 50%  distal stenosis.  There are no high-grade stenoses present.   Left ventricular function assessed by ventriculography demonstrates  anteroapical akinesis and inferior wall hypokinesis.   The LVEF is  estimated at 30-35%.   ASSESSMENT:  1. Severe LAD stenosis secondary to acute thrombus.  2. Moderate left circumflex stenosis.  3. Patent right coronary artery stent.  4. Moderately severe LV dysfunction.  5. Successful stenting of proximal LAD.   PLAN:  [name] will continue on aspirin, Plavix and Integrilin.  He  will be transferred to the step-down unit.  He is hemodynamically stable  at present.  He will need to undergo aggressive risk factor modification  as well as smoking cessation.      Veverly Fells. Excell Seltzer, MD  Electronically Signed     MDC/MEDQ  D:  04/30/2007  T:  05/02/2007  Job:  [id number]

## 2010-07-08 NOTE — Discharge Summary (Signed)
[name], [name] NO.:  [id number]   MEDICAL RECORD NO.:  [id number]          PATIENT TYPE:  OIB   LOCATION:  2899                         FACILITY:  [facility]   PHYSICIAN:  [name], MD, FACCDATE OF BIRTH:  1956/01/19   DATE OF ADMISSION:  10/03/2008  DATE OF DISCHARGE:  10/03/2008                               DISCHARGE SUMMARY   FINAL DIAGNOSES:  1. Cardioverter-defibrillator at elective replacement indicator.  2. Implant of a Medtronic VIRTUOSO II VR single chamber cardioverter-      defibrillator with defibrillator threshold study 15-25 joules, Dr.      Sherryl Manges.   SECONDARY DIAGNOSES:  1. History of severe long-term coronary artery disease with      involvement over many years with Cardiology.      a.     History of lateral myocardial infarction in 1998 with       percutaneous transluminal coronary angioplasty of the obtuse       marginal.      b.     Anterior wall myocardial infarction in December 2002 with ST-       segment elevation myocardial infarction/shock/intra-aortic balloon       pump/ventricular tachycardia/ventricular fibrillation stent placed       in the 100% proximal left anterior descending.      c.     Implant of a Medtronic Maximo single chamber cardioverter       defibrillator on April 12, 2002.      d.     In September 2005, ejection fraction 25%.  The stent was       patent.      e.     Presentation unstable angina, catheterization in July 2006.       A stent was placed to a mid 90% right coronary artery lesion.      f.     Catheterization in August 2007 ejection fraction 25-30%,       nonobstructive coronary artery disease.      g.     ST-segment elevation myocardial infarction; thrombus       proximal to the stent in the left anterior descending, this area       was stented, ejection fraction 30-35% in March 2009.  2. Ongoing tobacco habituation.  3. Hypertension.  4. Dyslipidemia.  5. Status post  cholecystectomy.   BRIEF HISTORY:  [name] is a 55 year old male.  He had an ICD  implanted in February 2004 as part of the Master trial.  He has a  history of prior myocardial infarction complicated by shock, intra-  aortic balloon pump, ventricular tachycardia, ventricular fibrillation.   His last catheterization was in March 2009.  He presented with thrombus  just proximal to the stent, which was placed in the LAD in 2002, this  was stented.  His ejection fraction 30-35%.   The patient needs to undergo generator replacement.  The risks and  benefits have been discussed and he wishes to proceed.  The device was  interrogated at office visit on August 21, 2008, which showed at  least one  episode of a sustained ventricular tachycardia.  This was slow and was  below the detection rate of 200 beats per minute.  The device was not  reprogrammed at that office visit.   HOSPITAL COURSE:  The patient presents electively on October 03, 2008.  He underwent explantation of his Medtronic Maximo cardioverter-  defibrillator with implant of the Medtronic Virtuoso II cardioverter-  defibrillator, Dr. Sherryl Manges.  The patient had no postprocedural  complications, perhaps a slight swelling at the ICD pocket.  He was  asked to keep his incision dry for the next 7 days and to sponge bathe  until Wednesday, October 10, 2008.  He is to remove the bandage on  Thursday, October 04, 2008, and leave the incision open to air.  He has  followup with the [facility] on Thursday, October 18, 2008, at noon.   He has 2 new medications at discharge.  1. Darvocet N-100 one-two tablets every 4-6 hours as needed for pain.  2. Keflex 500 mg tablets 1 tablet one-half hour before breakfast,      lunch, dinner, bedtime for the next 5 days.   His other medications are to continue:  1. Coreg 6.25 mg b.i.d.  2. Lasix 20 mg daily.  3. Simvastatin 80 mg daily at bedtime.  4. Nitroglycerin 0.4 mg sublingual as needed.  5.  Lisinopril 20 mg daily.  6. Enteric-coated aspirin 325 mg daily.  7. Plavix 75 mg daily.   We did talk about smoking and how hard it would be to quit.  The patient  was also given instructions to restrict activity until Monday, October 08, 2008, at which time he can go back to work mindful of taking a  little easier with the left arm.      [name], [place]      [name], MD, [facility]  Electronically Signed    GM/MEDQ  D:  10/03/2008  T:  10/04/2008  Job:  [id number]   cc:   Madolyn Frieze. Jens Som, MD, [facility]

## 2010-07-08 NOTE — Discharge Summary (Signed)
NAMEKOHEN, [name] NO.:  [id number]   MEDICAL RECORD NO.:  [id number]          PATIENT TYPE:  INP   LOCATION:  2011                         FACILITY:  [facility]   PHYSICIAN:  Madolyn Frieze. Jens Som, MD, FACCDATE OF BIRTH:  October 27, 1955   DATE OF ADMISSION:  04/30/2007  DATE OF DISCHARGE:  05/03/2007                               DISCHARGE SUMMARY   PROCEDURES:  1. Cardiac catheterization.  2. Coronary arteriogram.  3. Left ventriculogram.  4. PTCA and bare-metal stent to 1 vessel.   FINAL DISCHARGE DIAGNOSES:  1. Non-ST segment elevation myocardial infarction with bare-metal      stent to the left anterior descending.  2. Residual coronary artery disease in the obtuse marginal at 90%,      circumflex 50%, diagonal 70%, proximal right coronary artery 30%,      distal right coronary artery 50%, distal left anterior descending      70% treated medically.  3. History of noncompliance with medications secondary to financial      issues.  4. Status post stent to the proximal left anterior descending in 2002.  5. Status post drug-eluting stent to the right coronary artery with a      ZoMaxx study stent in 2006.  6. Ischemic cardiomyopathy with an ejection fraction of 35% by cath      this admission.  7. Status post Medtronic Maximo implantable cardioverter-      defibrillator.  8. Hypertension.  9. Hyperlipidemia.  10.History of rheumatic fever.  11.Chronic back pain.  12.History of laparoscopic cholecystectomy.  13.Ongoing tobacco use.  14.Anxiety.   Time of discharge 41 minutes.   HOSPITAL COURSE:  [name] is a 55 year old male with known coronary  artery disease.  He had 3 hours of chest pain not relieved with  sublingual nitroglycerin.  He also had EKG changes.  He came to the  emergency room and was taken urgently to the lab.   The cardiac catheterization showed 80% LAD proximal to the previously  placed stent and associated with thrombus which was treated  with PTCA  and bare-metal stent reducing the stenosis to zero.  His previously  placed LAD and RCA stents were patent.  Residual disease as described  above.   He tolerated the procedure well and the sheath was removed without  difficulty.  He had a smoking cessation consult and was seen by cardiac  rehab as well.  His peak CK-MB was 380/20.5 with a peak troponin of  1.21.  Total cholesterol was 209, triglycerides 266, HDL 30, LDL 126.  Zocor 80 was added to his medication regimen instead of Lipitor 80 in an  effort to minimize cost.   A 2-D echocardiogram was also performed and it showed an EF of 20-25%  with wall motion abnormalities as well as the left ventricular wall  thickness at the upper limits of normal.  He also had mild diastolic  dysfunction, mild aortic regurgitation and mild mitral valvular  regurgitation.   [name] has trouble affording his medications because he makes only  about $18,000 a year.  The  Plavix assistance form was filled out and he  was given a prescription and a card for a free 2 week supply of the  Plavix.  It was made clear to him that he must take the Plavix every day  for at least 30 days with the final determination of the duration of  Plavix to be made by Dr. Jens Som as an outpatient.   By May 03, 2007, [name] was ambulating without chest pain or  shortness of breath.  He was evaluated by Dr. Jens Som and considered  stable for discharge with close outpatient followup.   DISCHARGE INSTRUCTIONS:  1. His activity level is to be increased gradually with no lifting for      2 weeks and no driving for 3 days.  2. He is to call our office for problems with the cath site.  3. He is to stick to a heart-healthy diet.  4. He has a follow up appointment with Dr. Jens Som on May 20, 2007      at 10:45.  5. He has no primary care physician and is encouraged to follow up      with the [facility] in [place].   DISCHARGE MEDICATIONS:  1. Plavix  75 mg every day for at least 30 days.  2. Aspirin 325 mg daily.  3. Nitroglycerin sublingual p.r.n.  4. Simvastatin 80 mg a day.  5. Lisinopril 10 mg a day.  6. Metoprolol 25 mg b.i.d.  7. Nicotine patch p.r.n.      Theodore Demark, PA-C      Madolyn Frieze. Jens Som, MD, [facility]  Electronically Signed    RB/MEDQ  D:  05/03/2007  T:  05/04/2007  Job:  [id number]   cc:   [facility]

## 2010-07-08 NOTE — H&P (Signed)
NAMEREINHART, [name] NO.:  [id number]   MEDICAL RECORD NO.:  [id number]          PATIENT TYPE:  INP   LOCATION:  1823                         FACILITY:  [facility]   PHYSICIAN:  Madolyn Frieze. Jens Som, MD, FACCDATE OF BIRTH:  1955/06/20   DATE OF ADMISSION:  04/30/2007  DATE OF DISCHARGE:                              HISTORY & PHYSICAL   CHIEF COMPLAINT:  Chest pain.   HISTORY:  [name] is a 55 year old Caucasian male with a history of  ischemic cardiomyopathy and PCIs to several vessels who presents with  substernal chest pain for the last three hours.  The patient states that  he has been noncompliant chronically and discontinued his medications  over 6 months ago.  He does continue to smoke.  Between 5:30 and 6 p.m.  tonight the patient developed 5/10 substernal chest pain radiating to  his left arm.  He also noted shortness of breath and diaphoresis.  He  states that the pain is identical to his myocardial infarction in 1998.  EMS was called and noted ST elevations in V1 and V2 on telemetry.  The  patient received aspirin, morphine and nitroglycerin en route.  He  continues to complain of 5/10 chest pain.   PAST MEDICAL HISTORY:  1. Ischemic cardiomyopathy with an EF of 25-30%.  2. Coronary artery disease.      a.     MI in 1998 with angioplasty done.      b.     December of 2002 anterior myocardial infarction with       cardiogenic shock with VF.  PCI to the LAD was done at that time.      c.     In January of 2004 cath demonstrated a patent stent in the       LAD.      d.     In June of 2005 Adenosine Cardiolite demonstrated an EF of       27% with infarction but no inducible ischemia in the anterolateral       region.      e.     In September of 2005 cath demonstrated a patent stent.      f.     In July of 2006 admitted for chest pain.  EF was noted to be       45% with inferior basilar hypokinesis.  There was a 75-80% mid RCA       lesion and underwent PCI  with a 3.0 x 13 ZoMaxx II study stent       which was drug eluting.      g.     In May of 2007 atypical chest pain and underwent stress test       as an outpatient.  3. ICD placed for VT and ischemic cardiomyopathy.  Device is a      Medtronic Maxim ICD.  He has been in the master trial.  4. Hypertension.  5. Dyslipidemia.  6. Medical noncompliance.  7. Chronic back pain.  8. History of rheumatic fever as a child.  9. Anxiety.  10.Status post laparoscopic cholecystectomy in 2005.   ALLERGIES:  No known drug allergies.   MEDICATIONS:  None.   SOCIAL HISTORY:  The patient smokes between a half a pack and one pack  per day for the last 30 years, calculating between a 20-30 pack year  history.  Denies any excessive alcohol.  He works in [employer].   FAMILY HISTORY:  Notable for a father who died at age 12 from cancer.  One brother and 2 siblings are healthy with no significant coronary  artery disease.   REVIEW OF SYSTEMS:  Notable for chronic back pain.  The rest of the 12  review of systems were reviewed and are negative.   PHYSICAL EXAMINATION:  VITAL SIGNS:  Temperature 98.5, pulse 107,  respiratory rate 18, blood pressure 115/80.  GENERAL:  The patient is awake, alert and oriented x3.  He is in  moderate distress secondary to chest pain.  HEENT:  Normocephalic and atraumatic.  Pupils are equal, round and  reactive to light.  Extraocular movements are intact.  NECK:  No JVD.  No carotid bruits.  CARDIOVASCULAR:  Regular rhythm.  Tachycardic with no murmurs, rubs or  gallops.  LUNGS:  Decreased breath sounds diffusely but no wheezes, rales, or  crackles.  ABDOMEN:  Obese.  Positive bowel sounds.  Soft, nontender, nondistended.  EXTREMITIES:  No clubbing, cyanosis or edema.  There are 2+ femoral  pulses, 1+ distal pulses.  MUSCULOSKELETAL:  No joint effusions or tenderness.  NEURO:  Cranial nerves II-XII are grossly intact.  No focal  musculoskeletal or sensory  deficits.  SKIN:  Demonstrates no rashes or lesions.  LYMPHS:  Demonstrates no lymphadenopathy.  PSYCHIATRIC:  Demonstrates normal affect.   Chest x-ray is pending.  EKG demonstrates a sinus tachycardia with 1 mm  of ST elevation in V1 and V2.  Labs are pending.   ASSESSMENT AND PLAN:  This is a 55 year old Caucasian male with chest  pain and EKGs concerning for ST elevation myocardial infarction  anteriorly.  The patient will be taken to the cath lab emergently.  He  will be loaded with Plavix 600 mg just prior to cardiac cath.  We will  consider a bare metal stent due to his medical noncompliance.  He will  be initiated on his prior medications which he discontinued.      Reginia Forts, MD   Electronically Signed     ______________________________  Madolyn Frieze. Jens Som, MD, [facility]    RA/MEDQ  D:  04/30/2007  T:  04/30/2007  Job:  [id number]

## 2010-07-08 NOTE — Op Note (Signed)
NAMEGOERGE, MOHR NO.:  [id number]   MEDICAL RECORD NO.:  [id number]          PATIENT TYPE:  OIB   LOCATION:  2899                         FACILITY:  [facility]   PHYSICIAN:  [name], MD, FACCDATE OF BIRTH:  09/10/1955   DATE OF PROCEDURE:  10/03/2008  DATE OF DISCHARGE:  10/03/2008                               OPERATIVE REPORT   PREOPERATIVE DIAGNOSES:  Ischemic cardiomyopathy, previously implanted  implantable cardioverter-defibrillator with a history of sustained  ventricular tachycardia.   POSTOPERATIVE DIAGNOSES:  Ischemic cardiomyopathy, previously implanted  implantable cardioverter-defibrillator with a history of sustained  ventricular tachycardia.   PROCEDURE:  1. Explantation of a previously implanted device.  2. Implantation of a new device.  3. Intraoperative defibrillation threshold testing repair of the lead.   Following obtaining informed consent, the patient was brought to the  electrophysiology laboratory and placed on the fluoroscopic table in  supine position.  After routine prep and drape of the left upper chest,  lidocaine was infiltrated along line of the previous incision, a  fluoroscopic image having demonstrated the orientation of the device.  The incision was opened and carried down to layer of the device pocket  using sharp dissection.  The pocket was widened and the device was  explanted and retained, suture was removed.  It was noted that there was  disruption of the outer insulation of the defibrillator lead as well as  discoloration of the lead itself.  The lead was freed up in its entirety  down to the collar and was then a sheath with medical adhesive.  This  was allowed to dry.  The lead was then interrogated with an R-wave of  13.5, a pace impedance of 515, a threshold of 0.9 volts at 0.5  milliseconds.  Currently, threshold was 1.9 mA.  The proximal coil pace  impedances and distal coil pace impedances through the  RV hip were about  400 ohms.   The lead was then attached to a Medtronic VIRTUOSO [id number] H and  VIRTUOSO II ICD, model D274VRC.  Through the device, bipolar R-wave was  9.9 with a pace impedance of 437, threshold 0.2 volts at 0.5  milliseconds.  Proximal coil impedance was 47 and distal coil impedance  was 59.   At this point, defibrillation threshold testing was undertaken.  Ventricular fibrillation was induced via the T-wave shock.  After a  total duration of 5 seconds, a 15-joule shock was delivered through a  measured resistance of 47 ohms failing to terminate ventricular  fibrillation.  After total duration of 11 seconds, a 25-joule shock was  delivered through a measured resistance of 46 ohms terminating  ventricular fibrillation and restoring sinus rhythm.  The device was  implanted.  The pocket was copiously irrigated with antibiotic  containing saline solution.  Hemostasis was assured.  The lead and the  pulse generator were placed in the pocket, secured to the prepectoral  fascia.  The  wound was then closed in 2 layers.  The wound washed, dried, and a  benzoin Steri-Strip dressing was applied.  Needle counts, sponge counts,  and instrument counts were correct at the end of the procedure according  to the staff.  The patient tolerated the procedure without any apparent  complication.      [name], MD, [facility]  Electronically Signed     SCK/MEDQ  D:  10/03/2008  T:  10/04/2008  Job:  [id number]

## 2010-07-08 NOTE — Assessment & Plan Note (Signed)
[facility]                            CARDIOLOGY OFFICE NOTE   AUDEN, TATAR                        MRN:          [id number]  DATE:05/20/2007                            DOB:          1955-04-19    Mr. Neubauer is a pleasant gentleman who I have followed in the past for  coronary disease, ischemic cardiomyopathy, status post ICD.  He was  recently admitted with recurrent chest pain.  Noted he had discontinued  his medications.  He ruled in for myocardial infarction and underwent  cardiac catheterization.  He had a bare metal stent to his LAD which had  an 80% stenosis.  Since discharge, he has done well.  He has mild  dyspnea with more extreme activities.  There is no orthopnea, PND, pedal  edema, palpitations, pre-syncope, syncope or chest pain.  He is trying  to discontinue his tobacco use.   MEDICATIONS:  His medications include:  1. Lopressor 25 mg p.o. b.i.d.  2. Plavix 75 mg daily.  3. Aspirin 325 mg daily.  4. Lisinopril 20 mg daily.  5. Zocor 80 mg p.o. daily.   PHYSICAL EXAM:  VITAL SIGNS:  Today, shows a blood pressure 115/70.  His  pulse of 67.  He weighs 253 pounds.  HEENT:  Normal.  NECK:  Supple with no bruits.  CHEST:  Clear.  CARDIOVASCULAR:  Regular rate.  ABDOMEN:  Exam shows no tenderness.  Right groin shows no hematoma, no  bruit.  EXTREMITIES:  show no edema.   His electrocardiogram shows sinus rhythm at a rate of 67.  There is  lateral T-wave inversion.   DIAGNOSES:  1. Coronary artery disease status post recent subendocardial infarct,      as well as PCI of the LAD - He will continue on his aspirin,      Plavix, ACE inhibitor and Statin.  We will also continue with his      beta blocker.  2. Ischemic cardiomyopathy - We will continue with the above      medications, but after he completes his present prescription of      Lopressor, I will change to Coreg 6.25 mg p.o. b.i.d., and we will      titrate as tolerated  by pulse and blood pressure.  3. Status post ICD - He will follow up with VP concerning this issue.  4. Tobacco abuse - We discussed the importance of discontinuing this.  5. Hyperlipidemia - We recently resumed his Zocor, and we will check      lipids and liver in 4 weeks.  6. History of noncompliance - We discussed importance of being      compliant with his diet, exercise, discontinue his tobacco use and      medication usage.   We will see him back in 6 months.     Madolyn Frieze Jens Som, MD, [facility]  Electronically Signed    BSC/MedQ  DD: 05/20/2007  DT: 05/21/2007  Job #: [id number]

## 2010-07-08 NOTE — H&P (Signed)
NAME:  [name], TSAN NO.:  [id number]   MEDICAL RECORD NO.:  [id number]          PATIENT TYPE:  INP   LOCATION:  1823                         FACILITY:  [facility]   PHYSICIAN:  Gerre Pebbles B. Marisue Humble, MD DATE OF BIRTH:  1955/11/24   DATE OF ADMISSION:  02/24/2008  DATE OF DISCHARGE:                              HISTORY & PHYSICAL   PRIMARY CARDIOLOGIST:  Madolyn Frieze. Jens Som, MD, [facility]   ELECTROPHYSIOLOGIST:  [name], MD, [facility]   CHIEF COMPLAINT:  Chest pain.   HISTORY OF PRESENT ILLNESS:  This is a 55 year old gentleman with a  history of ischemic cardiomyopathy status post ICD placement and status  post multiple PCIs, who began having chest pain around 6:00 p.m.  He was  sitting when the pain came on, it was sharp in nature and substernal.  He had some pain in his right jaw as well.  This is similar to previous  pain that he has had in the past.  However, this episode did not occur  with shortness of breath or left arm pain.  It went away after 30-45  minutes on the way here when he was in the ambulance.  He took  nitroglycerin x3 at home as well as a full-dose aspirin.  When he got in  the ambulance, they gave him one more dose of nitroglycerin which caused  cessation of the pain.   PAST MEDICAL HISTORY:  1. Ischemic cardiomyopathy with an ejection fraction of approximately      25-30%.  He is status post ICD placement.  2. Coronary artery disease with his first MI in 1998 status post      angioplasty.  In December 2002, he had an anterior myocardial      infarction with cardiogenic shock which resulted in V fib, a PCI to      the LAD was done at that time.  In January 2004, he presented with      chest pain and had a patent stent noted in the LAD.  In July 2006,      he had a 75-80% mid RCA lesion and underwent PCI with a drug-      eluting stent.  His last cath was on April 30, 2007, where he had a      bare-metal stent placed to the LAD secondary to acute  thrombus.  He      had presented at that time with a non-ST-elevation MI.  Other      findings include a 90% obtuse marginal, a 50% circumflex, 70%      diagonal, proximal RCA 30%, distal RCA 50%, and distal LAD was 70%.      However, this was medically managed secondary to its distal nature.  3. History of noncompliance secondary to financial concerns.  4. Hypertension.  5. Hyperlipidemia.  6. Rheumatic fever as a child.  7. Anxiety.  8. Laparoscopic cholecystectomy.   ALLERGIES:  No known drug allergies.   MEDICATIONS:  1. Lisinopril 20 mg daily.  2. Plavix 75 mg daily.  3. Aspirin 325 mg daily.  4.  Coreg 6.25 mg b.i.d.  5. Simvastatin 80 mg daily.   SOCIAL HISTORY:  He currently lives in [place] with his wife.  He is a  Corporate investment banker.  He has a 30-pack-year history of smoking, smokes  approximately one-half pack per day currently.  Occasional alcohol.  No  drug use.   FAMILY HISTORY:  Significant for father died at the age of 30 from  cancer.  His siblings are otherwise healthy.  No history of premature  coronary artery disease.   REVIEW OF SYSTEMS:  Negative x10 except for that stated in the HPI.   PHYSICAL EXAMINATION:  VITAL SIGNS:  He is currently afebrile, pulse is  76, respiratory rate is 19, blood pressure is 109/69, and he is sating  98% on room air.  HEENT:  Pupils are equally reactive to light and accommodation.  Extraocular muscles are intact.  Sclerae are clear.  Mucous membranes  are moist.  Oropharynx is without erythema or exudate.  NECK:  Supple without lymphadenopathy or masses.  JVP is normal.  Carotid upstrokes are normal.  CARDIOVASCULAR:  Regular rate and rhythm.  S1 and S2 are normal.  No  murmurs, gallops, or rubs.  PMI is normal.  LUNGS:  Clear to auscultation bilaterally.  ABDOMEN:  Soft, nondistended, and nontender.  Good bowel sounds.  No  hepatosplenomegaly.  EXTREMITIES:  No clubbing, cyanosis, or edema.  Dorsalis pedis and   posterior tibial pulses are normal.  NEUROLOGIC:  Grossly normal.   Chest x-ray; no significant abnormalities.  ECG shows a rate of 76, in  normal sinus rhythm.  There are some nonspecific T-wave inversions  anterolaterally which is unchanged.  There is some poor R-wave  progression.  There is, otherwise, no significant change from his  previous ECG dated May 03, 2007.   LABORATORY DATA:  White count 6.8, H and H are 13.3 and 39.5  respectively, and platelets 243.  Sodium is 136, potassium 4.1, chloride  102, bicarb 27, BUN 14, creatinine 0.92, and glucose 92.  Liver function  tests were within normal limits.  First set of cardiac enzymes is  negative.   ASSESSMENT AND PLAN:  1. Chest pain.  We will cycle cardiac enzymes x3 and start a heparin      drip.  We will continue his home medications.  As it is Friday      night, he will likely not get a stress test tomorrow if he rules      out for myocardial infarction.  It would be completely reasonable      if he rules out to have him follow up with a myocardial perfusion      study as an outpatient on Monday.  However, if he rules in, he may      likely need repeat cardiac catheterization.  I am going to go ahead      and hold him n.p.o. past midnight in case he rules in.  2. Ischemic cardiomyopathy with an ejection fraction of 25-30%.      Currently, he is euvolemic.  No signs of volume overload.  I will      defer on getting a repeat echocardiogram at this time.  3. Hyperlipidemia, currently on statin.  We will check a fasting lipid      panel in the morning.  4. Hypertension, currently controlled on lisinopril and Coreg.  5. Followup.  He will be seen tomorrow by the [facility] and      further decisions  regarding his disposition will be made at that      time.      Jennelle Human Marisue Humble, MD  Electronically Signed    GBS/MEDQ  D:  02/24/2008  T:  02/25/2008  Job:  [id number]

## 2010-07-08 NOTE — Assessment & Plan Note (Signed)
[facility]                            CARDIOLOGY OFFICE NOTE   HADDON, FYFE                        MRN:          [id number]  DATE:02/29/2008                            DOB:          March 29, 1955    Mr. Cayabyab is a pleasant gentleman who has a history of coronary artery  disease, ischemic cardiomyopathy, and history of ICD.  His most recent  cardiac catheterization was performed in March 2009.  At that time, he  was found to have an 80% stenosis in the proximal LAD.  There is a  second diagonal with a 70% ostial stenosis.  There was a 50% circumflex  and a small first OM branch had an 80-90% stenosis.  There was a 50%  distal stenosis in the RCA, but the previous stent was patent.  He had a  PCI of his LAD at that time with a bare-metal stent.  Note, the ejection  fraction is 30-35%.  I last saw the patient on May 20, 2007.  He was  recently admitted to [facility] what was felt to be atypical  chest pain.  He ruled out for myocardial infarction with serial enzymes.  He was asked to schedule an outpatient Myoview for risk stratification,  but he did not do this.  Since discharge on February 25, 2008, he has had  no further chest pain and he denies any dyspnea, pedal edema,  palpitations, or syncope.   MEDICATIONS AT PRESENT:  1. Plavix 75 mg p.o. daily.  2. Aspirin 325 mg p.o. daily.  3. Lisinopril 20 mg p.o. daily.  4. Zocor 80 mg p.o. daily.  5. Lasix 20 mg p.o. daily.  6. Coreg 6.25 mg p.o. b.i.d.   PHYSICAL EXAMINATION:  VITAL SIGNS:  Blood pressure 122/80 and his pulse  is 82.  His weight is 243 pounds.  HEENT:  Normal.  NECK:  Supple with no bruits.  CHEST:  Clear.  CARDIOVASCULAR:  Regular rate and rhythm.  ABDOMEN:  No tenderness.  EXTREMITIES:  No edema.   DIAGNOSES:  1. Recent episode of chest pain - His symptoms are atypical.  I did      recommend a Myoview for risk stratification today.  However, he      declined  stating that I feel all right.  He understands the risks      of undiagnosed coronary artery disease.  2. Coronary artery disease, status post percutaneous coronary      intervention of his left anterior descending in March - He will      continue his aspirin, Plavix, ACE inhibitor, and statin.  He also      continue with his beta-blocker.  3. Ischemic cardiomyopathy - As per above, he will continue on his      present medications.  4. Status post implantable cardioverter-defibrillator - He will      continue to followup EP concerning this issue.  5. Tobacco abuse - We discussed the importance of discontinuing this.  6. Hyperlipidemia - He will continue on his Zocor.  His LDL is not at  goal, but he has significant financial problems with his      medications and therefore, I will not change to Crestor or Lipitor.  7. History of noncompliance - He is presently taking all of his      medications.   I will see him back in 6 months.  We will consider increasing his Coreg  at that time.     Madolyn Frieze Jens Som, MD, [facility]  Electronically Signed    BSC/MedQ  DD: 02/29/2008  DT: 03/01/2008  Job #: [id number]

## 2010-07-08 NOTE — Assessment & Plan Note (Signed)
[facility]                         ELECTROPHYSIOLOGY OFFICE NOTE   ARA, MANO                        MRN:          [id number]  DATE:06/08/2007                            DOB:          1955-09-17    [name] has an ICD implant for primary prevention in the setting of  ischemic heart disease.  He recently occluded his LAD and underwent PCI.  He saw Dr. Jens Som a couple of weeks ago and was doing pretty well.  Three days ago, he fell off of a bulldozer and banged up his legs.  He  has a significant bruise in his left popliteal fossa.   His medications include aspirin and Plavix as well as simvastatin,  lisinopril, Lasix, metoprolol.   On examination, his blood pressure was 133/85 with a pulse of 84.  His lungs were clear.  His heart sounds were regular.  There was mild swelling of his right calf.  There was significant ecchymosis and hematoma of his left popliteal  fossa distal thigh area.  There was no swelling in the left lower leg.   Interrogation of his Medtronic ICD demonstrates an R-wave of 8.2 with  impedance of 440, threshold of 1 volt at 0.4 with battery voltage 2.86.  No intercurrent episodes.   IMPRESSION:  1. Ischemic cardiomyopathy with a recent percutaneous coronary      intervention, depressed left ventricular function, prior myocardial      infarction.  2. Status post ICD for the above.  3. Recent leg trauma with ecchymosis and hematoma in the left upper      leg.   [name] is doing fine from an arrhythmia point of view.  I am most  concerned about the trauma to his left leg and whether he is at risk for  DVT.  I do not see any significant swelling in that leg.  If anything,  it is smaller than the right leg.  I have advised him to call us if any  swelling develops in the extremities.   I will see him again in one year's time.  He will be followed remotely  in the interim.     [name], MD, [facility]  Electronically Signed    SCK/MedQ  DD: 06/08/2007  DT: 06/08/2007  Job #: [id number]

## 2010-07-08 NOTE — Discharge Summary (Signed)
NAMEJONERIC, STREIGHT NO.:  [id number]   MEDICAL RECORD NO.:  [id number]          PATIENT TYPE:  INP   LOCATION:  2004                         FACILITY:  [facility]   PHYSICIAN:  Rollene Rotunda, MD, FACCDATE OF BIRTH:  October 28, 1955   DATE OF ADMISSION:  02/24/2008  DATE OF DISCHARGE:  02/25/2008                               DISCHARGE SUMMARY   PRIMARY CARDIOLOGIST:  He has two, one is Madolyn Frieze. Jens Som, MD and one  is [name], MD for his Electrophysiology.   DISCHARGE DIAGNOSIS:  Noncardiac chest pain.   SECONDARY DIAGNOSES:  1. Coronary artery disease status post first myocardial infarction in      1998, at which time he had an angioplasty and status post      myocardial infarction in 2002 with cardiogenic shock resulted in      ventricular fibrillation and percutaneous coronary intervention to      left anterior descending.  January 2004, presents with chest pain      and had a patent stent to the left anterior descending.  July 2006,      he had a 75-80% mid right coronary artery lesion and underwent      percutaneous coronary intervention with a drug-eluting stent.  His      last cath was April 30, 2007, had a bare-metal stent placed to the      left anterior descending secondary to acute thrombus.  He presented      at that time with a non-ST-elevation myocardial infarction.  Other      findings include a 90% obtuse marginal stenosis, 50% stenosis in      circumflex, 70% stenosis in diagonal, proximal right coronary      artery 30% stenosis, distal right coronary artery 50%, and distal      left anterior descending 70%.  He has been medically managed in      regards to these other lesions.  2. Ischemic cardiomyopathy with an ejection fraction of approximately      25-30%.  He is status post implantable cardioverter-defibrillator      placement.  3. History of noncompliance secondary to financial concern.  4. Hypertension.  5. Hyperlipidemia.  6.  Rheumatic fever as a child.  7. Anxiety.  8. Status post laparoscopic cholecystectomy.   ALLERGIES:  No known drug allergies.   PROCEDURES PERFORMED DURING THIS HOSPITALIZATION:  The patient had a  chest x-ray performed on February 24, 2008, which shows no acute findings.  The patient had an EKG performed on February 24, 2008, which shows sinus  rhythm with a rate of 76, some nonspecific T wave inversions  anterolaterally, which are unchanged from prior EKG.  There is some poor  R wave progression, otherwise no significant change from his previous  EKG used to compare dated May 03, 2007.  The patient had an EKG  performed on February 25, 2008, which shows no significant change from  either the January 1, or the March 10 from the previous year EKG except  for low voltage criteria in V2, most likely due to  lead placement.   BRIEF HISTORY OF PRESENT ILLNESS:  A 55 year old male with ICM, who  started having chest pain around 6 p.m. on January 1, when he was  sitting.  It was sharp and substernal, some radiation in the jaw.  Similar to previous pain with myocardial event, however, no shortness of  breath or pain in left arm.  It went away after 30-45 minutes and his  4th nitroglycerin, which he got in the ambulance.   HOSPITAL COURSE:  The patient was admitted on January 1, and had his  cardiac enzymes cycled.  He ruled out for cardiac source of his chest  pain with his 4th set of negative cardiac enzymes.  The patient was  asymptomatic at the time of discharge on January 2, after ambulating  around the floor, which he was admitted to without any further symptoms  as well.  Vital signs were stable during his hospital course.  At the  time of discharge, his most recent vital signs were temperature 98.1  degrees Fahrenheit, BP 90/58, pulse 69, respiration rate 18, O2  saturation 96% on room air.  The patient was instructed to follow up  with his primary cardiologist to evaluate for either  stress Myoview or  diagnostic cath in the near future.  The patient had no questions or  concerns at the time of discharge and was given these instructions, both  orally and in written form.  The patient was instructed that should his  symptoms recur, he should have a low threshold for returning to the  Emergency Department in case if any new symptoms are being cardiac  related.   DISCHARGE LABORATORY DATA:  WBC 5.6, HGB 13.2, HCT 38.9, PLT count 232.  Sodium 138, potassium 4.6, chloride 104, CO2 of 27, BUN 13, creatinine  0.89, glucose 103, calcium 8.8.  Last set of cardiac markers; CK 136, CK-  MB 1.6, troponin I less than 0.01.  Total cholesterol 159, triglycerides  67, HDL 41, LDL 105, VLDL 13.   FOLLOWUP PLANS AND APPOINTMENTS:  The patient instructed to follow up  with his primary cardiologist, Dr. Jens Som, as soon as possible.  As it  is Saturday, a voicemail was left for [facility] in order to  schedule the patient at the earliest possible convenience of the patient  and Dr. Jens Som.  The patient instructed that if he, for some reason,  did not hear from [facility] as of Monday, February 27, 2008, that  he should contact the office to make sure there was not a communication  problem.  The patient had no questions or concerns regarding the  scheduling of his followup appointment.   DISCHARGE MEDICATIONS:  1. Lisinopril 20 mg p.o. daily.  2. Plavix 75 mg p.o. daily.  3. Aspirin 325 mg p.o. daily.  4. Coreg 6.25 mg p.o. b.i.d.  5. Simvastatin 80 mg p.o. daily.   DURATION OF DISCHARGE ENCOUNTER INCLUDING PHYSICIAN TIME:  45 minutes.      Jarrett Ables, [facility]      Rollene Rotunda, MD, [facility]  Electronically Signed    MS/MEDQ  D:  02/25/2008  T:  02/26/2008  Job:  [id number]

## 2010-07-11 NOTE — H&P (Signed)
[name], [name] NO.:  [id number]   MEDICAL RECORD NO.:  [id number]          PATIENT TYPE:  INP   LOCATION:  3735                         FACILITY:  [facility]   PHYSICIAN:  Trimble Bing, M.D.  DATE OF BIRTH:  [birth year]   DATE OF ADMISSION:  09/16/2004  DATE OF DISCHARGE:                                HISTORY & PHYSICAL   REFERRING:  Dr. Lynelle Doctor.   PRIMARY CARE PHYSICIAN:  [facility].   PRIMARY CARDIOLOGIST:  Dr. Andee Lineman.   HISTORY OF PRESENT ILLNESS:  A 55 year old gentleman with ischemic  cardiomyopathy presents with recurrent chest pain. [name]'s cardiac  history dates to 2002 when he suffered a myocardial infarction that was  massive by his report and was treated with primary intervention. He has had  some congestive heart failure that has been controlled adequately with  medical therapy and has not required diuretics. He received an AICD due to  his age and significant risk with severe left ventricular dysfunction and  coronary artery disease. He was most recently admitted in August of last  year with recurrent chest discomfort. Catheterization demonstrated a patent  stent site in the proximal LAD with no significant stenosis in the main  trunk of any of his coronaries. He did have mid-80% stenosis of a small  first marginal vessel. Ejection fraction was 0.25.   [name] developed sudden onset of severe fairly localized sharp chest pain  in the lower substernal region. There was no radiation. There was no  associated dyspnea, diaphoresis or nausea. He used nitroglycerin without  benefit and summoned EMS who transported him to the emergency department.  His discomfort resolved spontaneously within approximately 30 minutes of  onset. Otherwise, [name] does not recall the last time he had chest  discomfort. He did have some left shoulder discomfort this weekend.   PAST MEDICAL HISTORY:  Generally unremarkable. He underwent a  cholecystectomy  last year. He has been told of borderline hypertension. He  has hyperlipidemia that has been apparently well controlled medically.   MEDICATIONS:  1.  Aspirin 81 milligrams q.d.  2.  Toprol 100 milligrams q.d.  3.  Atorvastatin 20 milligrams q.d.  4.  Ramipril 10 milligrams q.d.   ALLERGIES:  He reports no medical allergies.   SOCIAL HISTORY:  He has a history of tobacco use and medical noncompliance.  He is married and lives with his wife.   FAMILY HISTORY:  Generally negative for atherosclerotic disease.   REVIEW OF SYSTEMS:  Rare headaches; other systems reviewed and are negative.   PHYSICAL EXAMINATION:  GENERAL:  Pleasant gentleman in no acute distress.  VITAL SIGNS:  The temperature is 97.8, heart rate 70 and regular,  respirations 20, blood pressure 110/65. EENT: Anicteric sclerae; normal lids  and conjunctiva.  NECK: No jugular venous distension; normal carotid upstrokes without bruits.  SKIN: No significant lesions.  ENDOCRINE: No thyromegaly.  LUNGS: Clear.  CARDIAC: Normal first and second heart sounds; fourth heart sound present;  PMI not displaced.  ABDOMEN: Soft and nontender; no hepatosplenomegaly; normal bowel sounds.  EXTREMITIES: No edema;  normal distal pulses.   EKG: Normal sinus rhythm; inferolateral and anterolateral T-wave inversion,  somewhat more prominent than on a prior tracing obtained in January of 2006.   Other laboratory notable for a normal chemistry profile, normal CBC and  normal initial cardiac markers. Oxygen saturation 97% on room air. Chest x-  ray: Defibrillator over the left chest; otherwise unremarkable.   IMPRESSION:  [name] has a severe ischemic cardiomyopathy but has been  well compensated of late with medical therapy. He developed resting chest  discomfort that certainly could represent myocardial ischemia. Initial  testing is nondiagnostic. Accordingly, observation status will be  established. Serial cardiac markers and EKGs  will be obtained. Initial  therapy will be with low-molecular-weight heparin and his usual medications.  Nitrates will be withheld due to his developing a headache with transdermal  nitroglycerin. If he experiences recurrent chest discomfort, intravenous  nitroglycerin will be added and coronary angiography undertaken; otherwise,  cardiac catheterization can probably be avoided this admission. Control of  lipids will be assessed.       RR/MEDQ  D:  09/16/2004  T:  09/16/2004  Job:  [id number]

## 2010-07-11 NOTE — Assessment & Plan Note (Signed)
[facility]                            CARDIOLOGY OFFICE NOTE   KIWAN, GADSDEN                        MRN:          [id number]  DATE:05/07/2006                            DOB:          06-19-1955    [name] is a gentleman who has a history of coronary disease, ishemic  cardiomyopathy, and status post ICD. Since I last saw him he denies any  dyspnea, orthopnea, PND, pedal edema, or syncope. He did state that he  has had 2 episodes of chest pain. These are substernal in location and  similar to his previous cardiac pain. They lasted for 10 minutes and  resolved with nitroglycerine. He has not had any in the past 6 weeks.  His medications include;  1. Imdur 30 mg p.o. q.day.  2. Lisinopril 10 mg p.o. q.day.  3. Toprol 25 mg p.o. b.i.d.  4. Lasix 40 mg p.o. q.day.  5. Plavix 75 mg p.o. q.day.  6. Aspirin 325 mg p.o. q.day.  7. Zocor 40 mg p.o. q.h.s.   His physical exam today shows a blood pressure of 132/80 and his pulse  is 86. He weighs 238 pounds.  NECK: Supple.  CHEST: Clear.  CARDIOVASCULAR EXAM: Reveals regular rate and rhythm.  EXTREMITIES: Show no edema.   His electrocardiogram shows a sinus rhythm at a rate of 87. There are  nonspecific ST changes predominantly in the lateral leads.   DIAGNOSES:  1. Coronary artery disease, status post multiple PCIs.  2. Ischemic cardiomyopathy.  3. Status post ICD.  4. Hyperlipidemia.  5. Tobacco abuse.  6. History of noncompliance intermittently.   PLAN:  [name] has had 2 separate episodes of chest pain, the most  recent approximately 6 weeks ago. We did discuss a  Myoview today for  risk stratification but he declined. If he has worsening symptoms, then  he will contact us. Note his Imdur has caused headache and he has now  discontinued that medication and we will continue off of it. I will  increase his lisinopril to 20 mg p.o. q.day for his ischemic  cardiomyopathy and we will check  a BMET in 1 week to follow his tests  and renal function.  We will continue to titrate this and his Toprol in  the future. We will also increase his Zocor to 80 mg p.o. q.h.s. with a  goal LDL less than 70. He will have lipids and liver  in 6 weeks and we will adjust as indicated. Finally we again discussed  the importance of discontinuing his tobacco use as well as diet and  exercise. He will see me back in 6 months.     Madolyn Frieze Jens Som, MD, [facility]  Electronically Signed    BSC/MedQ  DD: 05/07/2006  DT: 05/09/2006  Job #: [id number]

## 2010-07-11 NOTE — Discharge Summary (Signed)
NAME:  [name], COMP NO.:  [id number]   MEDICAL RECORD NO.:  [id number]          PATIENT TYPE:  INP   LOCATION:  4703                         FACILITY:  [facility]   PHYSICIAN:  Arturo Morton. Riley Kill, M.D. [facility] OF BIRTH:  June 01, 1955   DATE OF ADMISSION:  11/12/2003  DATE OF DISCHARGE:  11/14/2003                           DISCHARGE SUMMARY - REFERRING   DISCHARGE DIAGNOSES:  1.  Chest pain.  2.  Nonobstructive coronary artery disease.  3.  Anxiety.  4.  History of tobacco abuse.  5.  History of noncompliance.  6.  History of an anterior myocardial infarction, with subsequent      ventricular tachycardia, ventricular fibrillation and cardiogenic shock.   HISTORY OF PRESENT ILLNESS:  The patient is a 55 year old male patient who  presented to the emergency room complaining of chest pain on November 12, 2003.  He suddenly developed the pain at 4:30 p.m., and it radiated into his  right chest.  He was not having any nausea or diaphoresis but did have some  shortness of breath.  He rated the pain at a 3/10.  He did take sublingual  nitroglycerin x3 over a time period of 20 minutes.  He called EMS and  received baby aspirin, oxygen and nitroglycerin.  By the time he arrived at  the emergency room he was pain-free.   LABORATORY DATA:  Part of his cardiac workup while in the hospital included  a chest x-ray that revealed no acute disease.  His electrocardiogram revealed T-wave inversion in the inferior lateral  leads.  White count 5.2, hemoglobin 13.0, hematocrit 37.6, platelets 216.  Sodium  138, potassium 3.8, BUN 9, creatinine 1.0.  Cardiac isoenzymes were  negative.   HOSPITAL COURSE:  The patient underwent a cardiac catheterization on the  following day and was found to have non-obstructive coronary artery disease  with severe cardiomyopathy with an ejection fraction of 25% with global  hypokinesis.   DISPOSITION:  He was felt to be ready for discharge to home  on November 14, 2003.   CONDITION ON DISCHARGE:  He was discharged home in stable condition.   DISCHARGE MEDICATIONS:  1.  Enteric-coated aspirin 81 mg daily.  2.  Toprol XL 100 mg daily.  3.  Niacin 500 mg b.i.d.  4.  Lipitor 20 mg daily.  5.  Altace 10 mg daily.  6.  Aldactone 25 mg daily.  7.  _________ 10 mg daily.  8.  He may use Tylenol p.r.n.   DISCHARGE INSTRUCTIONS:  1.  He is to weigh daily and to record.  He is to bring these numbers to his      next visit.  If his weight increases more than 2 to 4 pounds over one or      two days, he is to call the office.  2.  No straining or lifting over 10 pounds for one week.  3.  No driving for two days.  4.  A renal, low-fat diet.  5.  Clean his catheterization site with soap and water.  No scrubbing.  6.  Call for questions or concerns at [phone].   FOLLOWUP:  Follow up at [facility] on November 23, 2003, at 12 p.m.       LB/MEDQ  D:  01/01/2004  T:  01/01/2004  Job:  [id number]   cc:   Learta Codding, M.D. [facility]  [place], [place]

## 2010-07-11 NOTE — Cardiovascular Report (Signed)
NAME:  [name], [name] NO.:  [id number]   MEDICAL RECORD NO.:  [id number]          PATIENT TYPE:  INP   LOCATION:  4703                         FACILITY:  [facility]   PHYSICIAN:  Rollene Rotunda, M.D.   DATE OF BIRTH:  October 02, 1955   DATE OF PROCEDURE:  11/13/2003  DATE OF DISCHARGE:  11/14/2003                              CARDIAC CATHETERIZATION   PRIMARY CARE PHYSICIAN:  [facility].   PROCEDURE:  Left heart catheterization, coronary arteriography.   INDICATION:  Evaluate patient with chest pain, previous coronary disease and  severe cardiomyopathy.   PROCEDURE NOTE:  Left heart catheterization was performed via the right  femoral artery.  The artery was cannulated using an anterior wall puncture.  A 6 French arterial sheath was inserted via the modified Seldinger  technique.  Preformed Judkins and a pigtail catheter were utilized.  The  patient tolerated the procedure well and left the lab in stable condition.   RESULTS:   HEMODYNAMICS:  1.  LV 109/22.  2.  Aortic 114/82.   CORONARIES:  The left main was normal.  The LAD had a proximal stent with  diffuse luminal irregularities.  There was mid diagonal which was small and  normal.  The circumflex had proximal 25% stenosis after a second obtuse  marginal.  The first obtuse marginal was small with mid 80% stenosis.  A  second obtuse marginal was large and normal.  Third obtuse marginal was  large and normal.  Posterior lateral was small and normal.  The right  coronary artery was a large dominant vessel with proximal 25% stenosis and  diffuse luminal irregularities.   LEFT VENTRICULOGRAM:  Left ventriculogram was obtained in the RAO  projection. The EF was 25% with global hypokinesis.   CONCLUSION:  1.  Nonobstructive residual coronary disease.  2.  Severe cardiomyopathy.   PLAN:  The patient will continue to have aggressive medical management for  his cardiomyopathy.  At this time, no coronary  intervention is indicated.     JH/MEDQ  D:  11/13/2003  T:  11/14/2003  Job:  [id number]

## 2010-07-11 NOTE — H&P (Signed)
NAMEETHRIDGE, SOLLENBERGER NO.:  [id number]   MEDICAL RECORD NO.:  [id number]          PATIENT TYPE:  INP   LOCATION:  1826                         FACILITY:  [facility]   PHYSICIAN:  Lorain Childes, M.D. [facility]ATE OF BIRTH:  Sep 01, 1955   DATE OF ADMISSION:  09/29/2005  DATE OF DISCHARGE:                                HISTORY & PHYSICAL   PRIMARY CARDIOLOGIST:  Learta Codding, M.D.   PRIMARY CARE PHYSICIAN:  In [place].   CHIEF COMPLAINT:  Chest pain.   HISTORY OF PRESENT ILLNESS:  The patient is a 55 year old gentleman with  known coronary disease status post PCI with LAD in December of 2002, and PCA  with RCA with a drug-eluting stent in July of 2006, ischemic cardiomyopathy,  status post ICD, hypertension, dyslipidemia, who presents here with chest  pain.  The patient reports he had chest pain this evening around 8:30 or 9  o'clock.  He rated it as 6/10.  He took one sublingual nitroglycerin at home  which improved his pain for approximately 5 minutes, but then it recurred.  He took 2 additional sublingual nitroglycerin which relieved his pain  completely, but as he had to take 3 tablets, he called 911.  He has been  pain free since arrival.  He denies any shortness of breath, nausea,  vomiting, palpitations.  No syncope, no diaphoresis.  He reports that this  pain is similar to his prior angina pain; however, it is different from the  pain when he was admitted in May of this year with palpitations.  This  reminds him of his pain on his cath in July of 2006, for which he got a  stent in his RCA.  He describes this discomfort as a burning stabbing  sensation in the subxiphoid region.  He denies any heartburn symptoms.  No  change in his diet.  He did eat some tomatoes recently, but this does not  typically promote heartburn for him.  His last episode of chest pain that  was similar to this type of chest pain was in July of 2006.   PAST MEDICAL HISTORY:  1.  CAD.      1.  He had an MI in 1998 with angioplasty done.      2.  In December of 2002, he had an anterior myocardial infarction with          cardiogenic shock, and also VP and VF.  He had a cath with PCI of          his LAD at that time.      3.  In January of 2004, he was cathed.  He had a patent stent of his          LAD.  His EF was noted to be 30%.      4.  In June of 2005, he had an adenosine Cardiolite with an EF of 27%,          infarction but no ischemia in the anterolateral distribution.      5.  In September of 2005,  he had a cath done which revealed a patent          stent for an evaluation of chest pain.      6.  In July of 2006, he had a cath when he was admitted with chest pain.          His EF was noted to be 45% with inferior basilar hypokinesis.  His          left main was okay.  His LAD had a patent stent.  He had 20%          stenosis distal to the stent and a 30% ostial D2 stenosis.  His OM-1          was small with an 80-90% stenosis.  His mid circumflex had 30%          stenosis.  His RCA had 30% proximal followed by 75-80% mid lesion,          and then he has a distal 20% lesion.  He underwent PCI of this RCA          lesion with a 13 x 3.0 Zomax II study stent, which is drug-eluting.          The 80-90% stenosis was reduced to 0% stenosis.      7.  He had a subsequent admission in May of 2007 for atypical chest pain          and palpitations.  His cardiac enzymes were negative.  He had a          stress test as an outpatient, which is not available currently.  2.  ICD in situ secondary to ventricular tachycardia and ischemic      cardiomyopathy.  He has a Medtronic Maxim ICD, and he is in the Master      trial.  3.  Hypertension.  4.  Dyslipidemia.  5.  Medical non-adherence, partially secondary to monitor restrictions;      however, his wife also says he just forgets to take his medication even      though the pills are available.  6.  Chronic back pain.  7.   History of rheumatic fever as a child.  8.  Anxiety.  9.  Status post laparoscopic cholecystectomy in 2005.   SOCIAL HISTORY:  He lives with his wife.  He works in [employer].  He  continues to be active.  He continues to smoke a half a pack per day with a  20-pack year history.  He denies excessive alcohol use.  No drugs.  No  __________ medication.  He follows a regular diet.  He does not do any  regular exercise.   MEDICATIONS:  He is not taking any.  He has not taken any for the past  several months.   ALLERGIES:  He has no known drug allergies.   FAMILY HISTORY:  His mother is alive and okay.  Father died at the age of 80  from cancer.  He has 1 brother and 2 sisters who are healthy with  significant cardiac history.   REVIEW OF SYSTEMS:  He denies any fevers, chills.  No headache or visual  changes.  No skin rash or lesions.  His chest pain is as in the HPI.  No  shortness of breath, dyspnea on exertion, orthopnea.  No PND.  No lower  extremity edema.  No palpitations.  No syncope.  No coughing or wheezing.  No urinary symptoms.  No  weakness of an arm or a leg.  No difficulty with  his speech.  No bright red blood per rectum.  No melena or hematochezia.  No  nausea or vomiting.  No diarrhea.  He denies GERD symptoms or abdominal  pain.  All other systems are negative.   PHYSICAL EXAMINATION:  VITAL SIGNS:  Temperature 97.4, pulse 74,  respirations 18, blood pressure 108/60, satting 95% on room air.  GENERAL:  He is a pleasant male lying in bed, in no acute distress.  HEENT:  Normocephalic and atraumatic.  NECK:  JVP is __________.  There are 2+ carotid upstrokes.  There are no  bruits.  LUNGS:  Clear to auscultation bilaterally.  CARDIOVASCULAR:  He has normal S1 and S2, regular rate and rhythm.  The PMI  is nondisplaced.  He has no murmurs appreciated.  Pulses are 2+ throughout  and equal.  There are no femoral bruits present. SKIN:  He has an ICD in the left  infraclavicular region.  There is no  thinning, __________ or erosion.  He has no rashes noted.  ABDOMEN:  Soft, positive bowel sounds.  Nontender.  No organomegaly.  EXTREMITIES:  No edema.  He has strong distal pulses.  NEUROLOGIC:  Appeared nonfocal.   CHEST X-RAY:  No acute disease.  EKG:  Rate 74.  Sinus rhythm.  Normal axis.  P-R interval of 131 seconds.  QRS is 96 msec.  QTC is 402 msec.  He has Q  waves in the anterior septal leads.  He has Q's with minimal ST elevation  and T wave inversions in V2 through V3.  This is similar to a prior EKG  dated Jul 11, 2005.  He has T wave inversions in V2 through 5, similar to  prior EKG.   LABORATORY DATA:  White count is 5.7, hematocrit 37.7, platelets 216,  potassium 3.9, creatinine 1.1, glucose 115.  Point of care CK-MB is less  than 1, troponin less than 0.05, myoglobin of 48.3.   ASSESSMENT AND PLAN:  The patient is a 56 year old gentleman with known  coronary disease status post PCI of both his LAD and RCA, who now presents  with his chest pain.  1.  Coronary artery disease.  The patient with angina this evening.  He is      currently pain free.  He has been off all of his medications for the      past 1 month.  I am starting him back on aspirin, Plavix, metoprolol, an      ACE inhibitor and statins.  I will also anticoagulate him with Lovenox      and apply nitroglycerin paste.  Will monitor the EKGs very closely and      follow cardiac enzymes.  He currently wants to leave the hospital;      however, I explained to him in detail his need to stay.  He agrees for      now, but is not very happy about this.  I have counseled him on smoking      cessation.  Will check his lipid panel.  We again reviewed the need for      him to take his medications as prescribed.  If it is financial      assistance that he needs, I told him we could assist him with this      through various programs; however, he needed to talk to [place] further about       this.  2.  Hypertension.  His blood pressure is stable now.  Will continue his      medications and monitor it closely.  I have holding parameters for all      of his hypertensive agents.  3.  Ischemic cardiomyopathy.  He currently is euvolemic on exam.  I will      refill his medications.  I will hold any Lasix for now, as he is doing      quite well.  4.  Prophylaxis.  I will start him on Protonix 40 mg p.o. daily.  He is on      Lovenox.           ______________________________  Lorain Childes, M.D. [facility]     CGF/MEDQ  D:  09/29/2005  T:  09/29/2005  Job:  [id number]

## 2010-07-11 NOTE — Discharge Summary (Signed)
[facility]  Patient:    FORBES, LOLL Visit Number: [id number] MRN: [id number]          Service Type: MED Location: [id number] Attending Physician:  Daisey Must Dictated by:   Joellyn Rued, P.A.-C. Admit Date:  02/06/2001 Discharge Date: 02/16/2001   CC:         Clydene Fake, M.D., [place], [place]   Discharge Summary  DATE OF BIRTH: April 02, 1955  HISTORY OF PRESENT ILLNESS: Mr. Ellithorpe is a 55 year old white male, who presented with intermittent chest discomfort over the preceding several days. On the day of admission it was severe.  He presented to [facility] and EKG showed ST segment elevation in the anterior leads, complicated by ventricular tachycardia and ventricular fibrillation requiring defibrillation, lidocaine, and intubation, as well as IV amiodarone.  He was transferred to [facility] on an emergent basis for evaluation.  His history was notable for an angioplasty to the OM in May 2000.  He also continues to smoke, and he has a history of hyperlipidemia.  LABORATORY DATA: At [facility] H&H was 15.0 and 44.9, normal indices; platelets 285,000; WBC 12.9.  PT 10.9, PTT 20.  At [facility] on February 07, 2001 cholesterol was 205, triglyceride 76, HDL 40, LDL 150.  Initial CK at [facility]. [facility] was [phone] with an MB of 969 and relative index of 4.9.  Subsequent enzymes were declining. Admission sodium to . [facility] was 138, potassium 3.8, BUN 9, creatinine 1.1.  Magnesium 1.7.  Subsequent chemistries were unremarkable.  On February 09, 2001 PT was 15.2, INR 1.3.  On February 12, 2001 PT 16.1, INR 1.4.  On February 13, 2001 PT 18.1, INR 1.7.  On February 14, 2001 PT 16.5, INR 1.5.  On February 15, 2001 PT 18.6, INR 1.8.  On February 16, 2001 PT 19.9, INR 2.0.  H&H on admission to [facility] were 13.4 and 38.5,  normal indices; platelets 252,000; WBC 13.4.  H&H dropped down to a low of 9.9 and 28.1 on February 12, 2001; however, on February 15, 2001 prior to discharge was 10.1 and 28.8. Chest x-ray on admission showed mild pulmonary vascular prominence. Subsequent chest x-ray showed improved aeration.  On February 09, 2001 he did develop some right basilar atelectasis.  EKGs from [facility] showed findings consistent anterior myocardial infarction.  He also had some EKGs with ventricular tachycardia and ventricular fibrillation.  Subsequent EKGs at [facility] showed sinus tachycardia, persistent anterior ST segment elevation, with Q waves anteriorly.  HOSPITAL COURSE: Mr. Wrage was taken emergently to the catheterization laboratory by Dr. Gerri Spore.  According to his progress note, his EF was 35%, with anteroapical and distal inferoapical akinesis, 1+ MR.  He had a 30% left renal artery stenosis.  Coronaries were codominant.  He had a proximal 50% RCA lesion, mid 50% RCA lesion, the circumflex had a 50% proximal lesion, small branch off the circumflex had a 70% lesion, he had a proximal 30% LAD lesion, followed by a 100% proximal LAD lesion.  Angioplasty and stenting was performed, reducing the lesion from 100% to less than 10%.  Balloon pump was also inserted.  He was kept on Reopro post procedure.  Pulmonary consult was obtained to manage the vent.  Supportive care was continued over the next several days and medications were adjusted.  He continued to have some  nonsustained ventricular tachycardia.  Intra-aortic balloon pump was discontinued on February 08, 2001.  He remained intubated until February 11, 2001.  During that time it was discovered he had a left groin pseudoaneurysm post catheterization.  This was compressed without difficulty.  After intubation he was started on Coumadin for his large anteroapical wall myocardial infarction.  Echocardiogram was  obtained on February 11, 2001 showing an EF of 45-50%, akinesis of the apex to the mid distal anterior wall, no pericardial effusion; mild aortic root dilatation.  Cardiac rehab and case management began participating in his care and over the next several days his activity gradually increased.  Lipitor 20 mg was added to his medications for his hyperlipidemia.  By February 16, 2001 Dr. Gerri Spore felt that he could be discharged home.  DISCHARGE DIAGNOSES:  1. Acute anterior myocardial infarction, complicated by cardiogenic shock,     ventricular tachycardia and ventricular fibrillation requiring emergent     catheterization, intubation, defibrillation, intra-aortic balloon pump,     and intravenous antiarrhythmics.  2. Status post angioplasty and stenting of the left anterior descending     lesion.  3. Hypotension.  4. Hyperlipidemia.  5. Tobacco use.  6. History as previously.  DISCHARGE MEDICATIONS:  1. Enteric-coated aspirin 325 mg q.d.  2. Plavix 75 mg q.d. for at least 6-12 months, to be determined by Dr.     Phoebe Perch.  3. Lopressor 50 mg 1/2 tablet b.i.d.  4. Altace 1.25 mg b.i.d.  5. Coumadin 5 mg q.d. at least for three months in regard to his large     anteroapical myocardial infarction.  6. Sublingual nitroglycerin as needed.  7. Lipitor 20 mg q.h.s.  8. Niferex 150 mg b.i.d. for one month.  DISCHARGE ACTIVITY: He was instructed on no lifting, working, driving, sexual activity, or heavy exertion until seen by his physician.  DISCHARGE DIET: Maintain low-salt/low-fat/low-cholesterol diet.  DISCHARGE INSTRUCTIONS: He is advised no smoking or tobacco products.  FOLLOW-UP: He will need to have a PT/INR drawn on Friday with Dr. Phoebe Perch.  He will also need fasting lipids and liver function tests in six weeks to reassess his hyperlipidemia and Lipitor prescription.  He was asked to call Dr. Phoebe Perch in the morning to arrange a 10-14 day appointment. Dictated by:   Joellyn Rued, P.A.-C. Attending Physician:  Daisey Must DD:  02/16/01  TD:  02/17/01 Job: [id number]

## 2010-07-11 NOTE — H&P (Signed)
NAME:  [name], [name]                           ACCOUNT NO.:  [id number]   MEDICAL RECORD NO.:  [id number]                   PATIENT TYPE:  INP   LOCATION:  1823                                 FACILITY:  [facility]   PHYSICIAN:  [name], [name]            DATE OF BIRTH:  [birth year]   DATE OF ADMISSION:  03/23/2002  DATE OF DISCHARGE:                                HISTORY & PHYSICAL   CHIEF COMPLAINT:  Palpations and felt as if I was going to faint.   HISTORY OF PRESENT ILLNESS:  The patient is a 55 year old gentleman who has  a past history of coronary artery disease status post anterior infarct in  1998 and then another infarct in 2002.  He has had a stent in the LAD in  December 2002.  His last catheterization was this past month which showed a  patent LAD stent, 30% circumflex proximally, 30% marginal subtotaled to an  obtuse marginal #1 with bridging collaterals, 30% right, 50% acute marginal,  30% EF.  He was evaluated at that time for possible ICD.  It was felt that  he could enter the MASTER trial to look at T wave alternans in QRS complex  patients.   He was set up for this earlier this week but cancelled this visit.   Today he developed palpations and increased syncope. He did not lose  consciousness.  He denies other symptoms.   ALLERGIES:  No known drug allergies   CURRENT MEDICATIONS:  1. Lopressor 25 mg b.i.d.  2. Aspirin 81 mg a day.  3. Plavix 75 mg a day.  4. Niacin 500 mg q.h.s.  5. Zoloft 50 mg q.h.s.  6. Lipitor 20 mg q.h.s.  7. Ramipril 2.5 mg daily.   SOCIAL HISTORY:  He lives in [place] with his wife.  He is married and has  six children.  He smoked one to two cigarettes a day, no alcohol.  He is a  heavy Arboriculturist.  He does not use drugs.   FAMILY HISTORY:  Really noncontributory.   REVIEW OF SYSTEMS:  Were outlined by [name], nurse practitioner for  electrophysiology, and the H&P form.   PHYSICAL EXAMINATION:  VITAL SIGNS:   Blood pressure 115/68, pulse 86.  He  has frequent PVCs but no evidence of nonsustained VT at this time.  Temperature 98.6, respiratory rate 16, O2 saturation 99% on room air.  GENERAL:  Well developed, well nourished white male.  HEENT:  Exam unremarkable.  NECK:  Remarkable for no JVD.  Carotid upstrokes equal bilaterally without  bruits.  No thyromegaly.  Trachea midline.  LUNGS:  Clear to auscultation and percussion.  HEART:  No obvious gallop.  He has a soft systolic murmur at the apex.  ABDOMEN:  Soft with good bowel sounds.  No midline bruit.  EXTREMITIES:  No cyanosis, clubbing, or edema.  Pulses are brisk.  NEUROLOGIC:  Grossly intact.   LABORATORY DATA:  EKG shows normal sinus rhythm with frequent PVCs,  nonsustained ST segment change particularly in I, aVL, V4 through V6.  He  has a PR interval of 0.158, QRS of 0.92 msec.   Enzymes are pending.   Potassium 4.2, magnesium 1.8.   PROBLEM LIST:  1. Nonsustained ventricular tachycardia seen on last admission, now with     palpations and presyncope.  2. Coronary artery disease with previous myocardial infarction with ischemic     cardiomyopathy.  He has a patent stent to the left anterior descending     artery and nonobstructive disease otherwise except for totalled marginal     with good collaterals.  He had recent catheterization this month 2004.  3. Hypertension.  4. Hyperlipidemia.  5. A 30% left renal artery stenosis.   PLAN:  [employer] to see if he could still be in the MASTERS  trial.  Dr. Ladona Ridgel and Dr. Graciela Husbands will discuss whether or not they will  proceed with EP study and ICD plus/minus antiarrhythmic therapy.  In the  meantime, will increase his beta blocker.                                               [name] C. Daleen Squibb, M.D. [facility]    TCW/MEDQ  D:  03/23/2002  T:  03/23/2002  Job:  [id number]   cc:   [name], M.D. [name]   Hayden Rasmussen, M.D.   Heide Guile, M.D.  [place]

## 2010-07-11 NOTE — Assessment & Plan Note (Signed)
[facility] HEALTHCARE                                   ON-CALL NOTE   DESTIN, VINSANT                        MRN:          [id number]  DATE:09/30/2005                            DOB:          08-12-1955    [name] is a 55 year old male with known coronary artery disease.  He was  in the hospital from August 7 until September 30, 2005 because he had chest pain  and he had not been taking any of his medications.  He had a cardiac  catheterization for which medical management was recommended for noncritical  coronary artery disease.  Today, he took off the dressing over his cardiac  cath site in his groin and noticed, what he described as wetness.  He called  to make sure this was okay.   I discussed the situation with [name].  He stated his wound was not  oozing or bleeding, and he said he was not having any significant pain and  there was no swelling.  He just said that there was a little reddish-colored  wetness on his groin and wanted to make sure that he was okay.   I told [name] that if he had problems with his groin or felt like his  groin was actively bleeding, he should call 911 and come to the emergency  room immediately.  I told him that if he felt like he was just having some  slight oozing and there was no significant problems, then it was okay to  monitor this closely and to call 911 and come to the hospital if it got any  worse.  He stated that he did not feel that he was having any significant  oozing and that he felt like this was just a drop or two of blood on the  bandage itself that had come off.  He did not feel that he needed to come to  the emergency room, and he felt he would be fine to stay home.  I told him  if his condition changed, then he should call 911 and come to the emergency  room, and he stated that he would do so.                                   Theodore Demark, PA-C                                Learta Codding,  MD, [facility]   RB/MedQ  DD:  09/30/2005  DT:  09/30/2005  Job #:  [id number]

## 2010-07-11 NOTE — Cardiovascular Report (Signed)
[place]. [facility]  Patient:    [name], [name] Visit Number: [id number] MRN: [id number]          Service Type: MED Location: CCUA [id number] Attending Physician:  Daisey Must Dictated by:   Daisey Must, M.D. [facility] Proc. Date: 02/06/01 Admit Date:  02/06/2001   CC:         Clydene Fake, M.D.  Cardiac Cath Lab   Cardiac Catheterization  PROCEDURE PERFORMED: 1. Right and left heart catheterization with coronary angiography, left    ventriculography, and abdominal aortography. 2. Intra-aortic balloon pump placement via the left femoral artery. 3. Percutaneous transluminal coronary angioplasty with Angiojet thrombectomy    and stent placement in the mid left anterior descending artery.  INDICATIONS FOR PROCEDURE:  [name] is a 55 year old male with a history of previous lateral wall myocardial infarction.  He underwent an angioplasty of the circumflex marginal by Dr. Juanda Chance in 1988.  He has been having intermittent chest pain over the past three days.  Unfortunately, he has been noncompliant with his medications, not taking any of them, including aspirin. Ultimately, today he had severe chest pain and presented to the [facility] Emergency Room.  EKG was consistent with an acute extensive anterior wall myocardial infarction.  He subsequently had ventricular tachycardia then ventricular fibrillation requiring several defibrillations.  He was intubated and treated with CPR briefly.  He was then transferred emergently to [facility] for cardiac catheterization.  Prior to transfer, he was started on Amiodarone drip and his rhythm was stabilized.  CATHETERIZATION PROCEDURE NOTE:  We initially placed a 7 French sheath in the right femoral artery.  We then placed a 6 French sheath in the right femoral vein which was ultimately upsized to an 8 [place] sheath.  We initially performed a diagnostic left heart catheterization with standard Judkins  6 French catheters.  Contrast was Omnipaque.  Following completion of the percutaneous intervention to be described below, a right heart catheterization was performed, and a Swan-Ganz catheter was left in place for hemodynamic monitoring.  We also placed an 8 French 40 cc intra-aortic balloon pump via the left femoral artery prior to proceeding with percutaneous intervention. There were no complications.  CATHETERIZATION RESULTS: 1. Hemodynamics (right heart pressures were measured after completion of the    percutaneous intervention):  Right atrial pressure mean 20, right    ventricular pressure 47/21, pulmonary artery pressure 44/32, pulmonary    capillary wedge mean pressure 32, left ventricular pressure 116/32, aortic    pressure 100/72.  There is no gradient across the aortic valve. 2. Left ventriculogram:  There is severe akinesis of the anterior apical and    distal inferior apical walls.  Ejection fraction is calculated at 35%.    There is 1+ mild mitral regurgitation. 3. Abdominal aortogram reveals a 30% stenosis in the left renal artery.    Otherwise, the abdominal aorta and ______ are normal. 4. Coronary angiography codominant):  Left vein is normal.  Left anterior    descending artery has a tubular 30% stenosis in the proximal vessel.  The    mid vessel is 100% occluded just after a small first diagonal branch.    There is TIMI 0 flow beyond this occlusion.  After reperfusion was    achieved, the distal left anterior descending artery was demonstrated to be    a very large vessel, curling the apex and supplying the distal portion of    the inferior wall.  There is  a normal sized second diagonal rising from the    mid left anterior descending artery.  Left circumflex is a codominant    vessel.  There is a 50% stenosis in the mid vessel.  There is a small first    obtuse marginal branch which has a 70% stenosis.  There is a large second    obtuse marginal branch which has minor  luminal irregularities.  The distal    circumflex gives rise to a large first posterolateral branch and a small    second posterolateral branch.  The right coronary artery is a codominant    vessels and has an anterior takeoff.  There is a 50% stenosis in the    proximal vessel, and a 50% stenosis in the distal vessel.  IMPRESSIONS: 1. Elevated right and left heart filling pressures with moderate pulmonary    hypertension. 2. Severely decreased left ventricular systolic function secondary to    extensive acute anterior apical wall myocardial infarction. 3. One vessel coronary artery disease as described.  The culprit is 100%    occlusion in the mid left anterior descending artery.  There is moderate    disease in the left circumflex and the right coronary artery which does not    appear to be hemodynamically significant.  PLAN:  Percutaneous intervention to the left anterior descending artery, see below.  PERCUTANEOUS TRANSLUMINAL CORONARY ANGIOPLASTY PROCEDURAL NOTE:  Following completion of the diagnostic catheterization we proceeded with percutaneous coronary intervention.  Heparin and Reapro were administered per protocol.  We used a 7 [place] guiding catheter.  The lesion in the mid left anterior descending artery was successfully crossed with a Choice PT wire which was advanced to the distal left anterior descending artery.  This demonstrated reperfusion into the distal vessel.  There was significant residual thrombus in the proximal to mid vessel.  We then performed Angiojet thrombectomy with a 4 [place] Angiojet catheter for a total of 5 passes.  Following this, there was still residual significant stenosis at the primary occlusion site, as well as mild to moderate residual thrombus.  We then performed percutaneous transluminal coronary angioplasty with a 3.0 x 20 mm Quantum balloon inflated to 12 atmospheres.  Following this, we deployed a 3.5 x 28 mm VX Velocity  heparin  coated stent, extending from the proximal to mid left anterior descending artery.  This stent was deployed at 9 atmospheres.  We post dilated this stent with a 3.5 x 20 mm Quantum balloon inflated to 18 atmospheres at the distal edge of the stent, 12 atmospheres in the proximal edge of the stent.  We then post dilated the very center of the stent with a 4.0 x 15 mm Quantum balloon inflated to 20 atmospheres.  There was an area of significant ectasia in the mid left anterior descending artery within the stented area. We attempted our best to fully oppose the stent to this ectatic area vessel with the 4-0 Quantum balloon, however, there may have still been residual area of ectasia where the stent was not completely opposed to the vessel wall. Intracoronary nitroglycerin and verapamil were administered intermittently to improve the coronary flow.  Final angiographic images were obtained to see the left anterior descending artery with less than 10% residual stenosis and TIMI III flow into the distal vessel.  Following this, the intra-aortic balloon pump was secured in place.  Swan-Ganz catheter was left in place for hemodynamic monitoring.  It should be noted, we also placed a temporary  pacemaker in the right ventricular apex prior to proceeding with Angiojet thrombectomy.  This was removed after we completed the Angiojet thrombectomy portion of the procedure.  COMPLICATIONS:  None.  RESULTS:  Successful Angiojet thrombectomy followed by percutaneous transluminal coronary angioplasty and stent placement in the proximal to mid left anterior descending artery, 100% occlusion with TIMI 0 flow was reduced to less than 10% residual TIMI III flow.  PLAN:  ______ continue for 12 hours.  The patient will be restarted on heparin after sheath removal as long as the intra-aortic balloon pump is left in place.  He will be continued on intra-aortic balloon pump support for the next 24 to 48 hours.   Swan-Ganz catheter will be left in place for hemodynamic monitoring.  He will be continued on ventilatory support as well.  After recovery from above, we will repeat a 2-D echocardiogram to determine whether Coumadin may be indicated for akinesis of the left ventricular anterior apical wall.  In addition, the patient will be treated with aspirin and Plavix for 6 to 12 months. Dictated by:   Daisey Must, M.D. [facility] Attending Physician:  Daisey Must DD:  02/06/01 TD:  02/06/01 Job: [id number]
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
## 2010-07-11 NOTE — Assessment & Plan Note (Signed)
[facility] HEALTHCARE                              CARDIOLOGY OFFICE NOTE   RICHERD, GRIME                        MRN:          [id number]  DATE:11/11/2005                            DOB:          09-07-1955    Mr. Capp is a gentleman who has a history of coronary disease, ischemic  cardiomyopathy, and is status post ICD.  He was recently readmitted to [facility] with chest pain.  Noted at that time, he had quit taking his  medications.  This was due to financial issues.  He did undergo cardiac  catheterization on September 29, 2005.  His first obtuse marginal was small with  a proximal 99% stenosis but unchanged from previous catheterizations.  Otherwise, his stents were patent, and there was nonobstructive disease.  He  has been treated medically.  Note, his ejection fraction was 25-30%.  Since  that time, he denies any dyspnea, chest pain, palpitations, or syncope.  There is no pedal edema.  Note, he does continue to smoke.   MEDICATIONS AT PRESENT:  1. Imdur 30 mg p.o. daily.  2. Lisinopril 10 mg p.o. daily.  3. Metoprolol 25 mg p.o. b.i.d.  4. Lasix 40 mg p.o. daily.  5. Plavix 75 mg p.o. daily.   PHYSICAL EXAMINATION:  VITAL SIGNS:  Blood pressure 132/80.  His pulse is  86.  He weighs 238 pounds.  NECK:  Supple.  There are no bruits.  CHEST:  Clear.  CARDIOVASCULAR:  Regular rate and rhythm.  His right groin shows no hematoma  and no bruit.  EXTREMITIES:  No edema.   His electrocardiogram shows a sinus rhythm at a rate of 86.  There are  nonspecific ST changes.  Note, prior septal infarct cannot be excluded.   DIAGNOSES:  1. Coronary artery disease status post percutaneous coronary intervention.  2. Ischemic cardiomyopathy.  3. Status post ICD.  4. __________ .  5. Tobacco abuse.  6. History of clinical noncompliance secondary to finances.   PLAN:  Mr. Cleavenger is doing well from a symptomatic standpoint.  Note, he did  not start  his Simvastatin previously, and I have asked him to begin that at  40 mg p.o. q.h.s.  We will change his Metoprolol to Metoprolol ER given his  ischemic cardiomyopathy.  In 6 weeks, we will checks lipids and liver as  well as his renal function.  I have elected not to increase his medications  at this point, as there have been problems with compliance due to financial  issues.  We will try to  increase his Lisinopril and Toprol as tolerated by pulse and blood pressure  in the future.  We discussed the importance of discontinuation of tobacco  use.  I will see him back in 6 months.                              Madolyn Frieze Jens Som, MD, [facility]    BSC/MedQ  DD:  11/11/2005  DT:  11/12/2005  Job #:  [id number] 

## 2010-07-11 NOTE — Consult Note (Signed)
   NAME:  [name], [name]                           ACCOUNT NO.:  [id number]   MEDICAL RECORD NO.:  [id number]                   PATIENT TYPE:  EMS   LOCATION:  MINO                                 FACILITY:  [facility]   PHYSICIAN:  Willa Rough, M.D. [facility]              DATE OF BIRTH:  1955/12/20   DATE OF CONSULTATION:  04/14/2002  DATE OF DISCHARGE:                                   CONSULTATION   EMERGENCY ROOM CARDIOLOGY CONSULTATION:   REASON FOR CONSULTATION:  The patient recently had an ICD placed at [facility].  He went home on 04/13/02.  He has been feeling well until this  afternoon when he felt swelling in his ICD site.  He feels that it occurred  rapidly.  He is now here for further evaluation.   I have seen and evaluated the patient in the triage area.  I spoke with the  patient and his wife.   PHYSICAL EXAMINATION:  The ICD site overall looks good.  There is no  redness.  There is no increased temperature.  There is mild pain to  palpation.  There is mild swelling.   ASSESSMENT AND PLAN:  I feel that the patient has mild swelling of the ICD  site.  I cannot completely explain why the patient feels that this became  acutely worse today.  However, he appears to be quite stable.  There is no  indication that he needs further evaluation at this point.  This was  discussed fully with him but I made it clear to him that he could call me  back this evening and I made it clear that I will call him tomorrow on  Saturday to see how he is doing and see if he needs to be seen in followup  in the emergency room.                                               Willa Rough, M.D. [facility]    JK/MEDQ  D:  04/14/2002  T:  04/15/2002  Job:  [id number]   cc:   [name], M.D. [facility]   Doylene Canning. Ladona Ridgel, M.D. [facility]

## 2010-07-11 NOTE — H&P (Signed)
NAME:  [name], [name] NO.:  [id number]   MEDICAL RECORD NO.:  [id number]          PATIENT TYPE:  INP   LOCATION:                               FACILITY:  [facility]   PHYSICIAN:  Pricilla Riffle, M.D.    DATE OF BIRTH:  07-Oct-1955   DATE OF ADMISSION:  10/23/2004  DATE OF DISCHARGE:                                HISTORY & PHYSICAL   IDENTIFICATION:  [name] is a 55 year old gentleman with CAD who presents  for routine blood check and complains of chest pain.  Is being admitted for  further evaluation.   HISTORY OF PRESENT ILLNESS:  The patient has a known history of coronary  artery disease status post myocardial infarction in 2002.  Received stent to  the LAD.  Patient had unstable angina this summer.  Underwent cardiac  catheterization that showed an 80% OM lesion (small vessel).  There was a  75% proximal RCA lesion that was a complex lesion.  Vessel supplied a large  PDA.  This underwent PTCA stent placement (patient enrolled in ZOMAX trial).  The patient has not been seen since discharge.  He said last night he was  under some increased stress because of family matters, began developing  chest pressure.  He described it as a heaviness like a balloon expanding in  his chest, different from his angina, however.  He went to bed and when he  woke up this morning the pressure came back and it has gradually progressed.  He presented for routine blood draw and was then brought back for further  evaluation.   Patient denies any episodes of chest pain like he had prior to the  intervention since discharge.   ALLERGIES:  None.   CURRENT MEDICATIONS:  1.  Toprol XL 100 daily.  2.  Plavix 75 daily.  3.  Altace 10 daily.  4.  Lipitor 20 q.h.s.  5.  TriCor 145 daily.  6.  Aspirin 325 daily.  7.  Nicotine patch.   Note, patient did not take medicines today, may have missed a couple doses  here and there, especially of Plavix.   PAST MEDICAL HISTORY:  1.  Coronary  artery disease.  2.  Dyslipidemia.  3.  Hypertension.  4.  History of noncompliance.  5.  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  Interrogation today shows no      shocks.  Patient has sinus tachycardia, some PVCs.  Not clearly      nonsustained VT).   SOCIAL HISTORY:  The patient has history of tobacco use, medical  noncompliance.  Is married and lives with his wife.   FAMILY HISTORY:  Negative for atherosclerotic disease.   REVIEW OF SYSTEMS:  Denies fevers, chills.  No cough.  No recent chest  trauma.  No history of reflux.  No problems swallowing.   PHYSICAL EXAMINATION:  GENERAL:  The patient comes in with chest pain 5/10  in intensity.  Nitroglycerin touched minimally.  Patient given two  Tums with  some improvement, but then reoccurrence to 3-4/10.  VITAL SIGNS:  Blood pressure initially 172/88, after nitroglycerin 130/70,  pulse 88 and regular, weight 246.  NECK:  JVP is normal.  LUNGS:  Clear.  CARDIAC:  Regular rate and rhythm.  S1, S2.  No S3.  No definite S4.  EXTREMITIES:  No edema.  2+ pulses.   12-lead EKG shows normal sinus rhythm with frequent PVCs, septal infarct.   IMPRESSION:  [name] is a 55 year old gentleman with history of coronary  disease status post recent stent to the right coronary artery.  Now presents  with chest pain that is different from his previous.  I have discussed the  case with Dr. Riley Kill and Dr. Gala Romney.  Patient had mild improvement with  the Tums, but not resolution, still a 4/10.  It is different from his  angina, but still concerning especially given he may have missed Plavix at  times.  He admits to only a couple times but there is a question.  Would  recommend, therefore, that the patient be admitted to telemetry for  observation, rule out.  Try to contain his pain.  If he rules out for  myocardial infarction schedule adenosine Myoview to further evaluate.  If   positive he will have a repeat cardiac catheterization.           ______________________________  Pricilla Riffle, M.D.     PVR/MEDQ  D:  10/23/2004  T:  10/23/2004  Job:  [id number]

## 2010-07-11 NOTE — Cardiovascular Report (Signed)
NAME:  [name], [name]                 ACCOUNT NO.:  [id number]   MEDICAL RECORD NO.:  [id number]           PATIENT TYPE:   LOCATION:                                 FACILITY:   PHYSICIAN:  Rollene Rotunda, M.D.   DATE OF BIRTH:  07/20/1955   DATE OF PROCEDURE:  09/29/2005  DATE OF DISCHARGE:                              CARDIAC CATHETERIZATION   PROCEDURE:  Left heart catheterization/coronary arteriography.   INDICATIONS:  Evaluate patient with chest pain and ST segment changes in the  anterior leads.  He did have previous stenting of his LAD, and his right  coronary artery.   PROCEDURAL NOTE:  Left heart catheterization performed via the right femoral  artery.  The artery was cannulated using an anterior puncture.  A #6 French  arterial sheath was inserted via the modified Seldinger technique.  A  performed Judkins and a pigtail catheter were utilized.  The patient  tolerated the procedure well and left the lab in stable condition.   RESULTS/HEMODYNAMICS:  LV 117/12, AO 117/89.   CORONARIES:  1. The left main was normal.  2. The LAD had ostial 25% stenosis.  There was a proximal stent with      InStent luminal irregularities.  There was a diagonal which was      moderate sized.  It was a mid vessel with ostial of 30% stenosis.  3. The circumflex was enlarged.  In the AV groove there was a proximal 30%      stenosis.  There was a first obtuse marginal which was small with      proximal 99% stenosis unchanged from previous catheterization.  There      was a large midobtuse marginal which had ostial luminal irregularities.      There was a large posterolateral which was normal.  4. The right coronary artery was dominant.  There was a proximal stent      with luminal irregularities.  There was a distal 30% stenosis before      the PDA.  The PDA was large and normal.   LEFT VENTRICULOGRAM:  The left ventriculogram was obtained in the RAO  projection. The EF was approximately 25-30%  with global hypokinesis; and  inferior akinesis.   CONCLUSION:  Nonobstructive residual coronary artery disease in large  vessels.  He does have high-grade obstructive disease in a marginal branch.  However, this needs to be managed medically.   PLAN:  The patient had come off of all of his medications.  This, despite  the fact that he could afford the meds, they are generic and could be very  inexpensive at one of the large retails. We have discussed this.  He needs  to be on a mononitrate.  He needs to be on an aspirin.  We have discussed  the need to stop smoking.  He is going to be restarted on his ACE inhibitor  and his beta blocker.  He will followup with Dr. Jens Som.           ______________________________  Rollene Rotunda, M.D.     JH/MEDQ  D:  09/29/2005  T:  09/29/2005  Job:  [id number]   cc:   Lonie Peak, M.D.  Olga Millers, M.D. [facility]

## 2010-07-11 NOTE — Discharge Summary (Signed)
NAMEGORDAN, [name] NO.:  [id number]   MEDICAL RECORD NO.:  [id number]          PATIENT TYPE:  INP   LOCATION:  2033                         FACILITY:  [facility]   PHYSICIAN:  [place] Bing, M.D. [facility] OF BIRTH:  1955/10/03   DATE OF ADMISSION:  07/11/2005  DATE OF DISCHARGE:  07/12/2005                                 DISCHARGE SUMMARY   DISCHARGING CARDIOLOGIST:  [place] Bing, M.D.   PATIENT'S CARDIOLOGIST:  Learta Codding, M.D.   PRIMARY CARE PHYSICIAN:  [facility].   ALLERGIES:  No known drug allergies.   PROCEDURE:  None.   HISTORY OF PRESENT ILLNESS:  This 55 year old male with a history of  coronary artery disease that was admitted on Jul 11, 2005 after he felt  irregular heart rate and palpitations and fleeting chest pain. The patient  was admitted to [facility] and Cardiac enzymes were cycled which were  negative. The patient also has a history of noncompliance and had not been  taking Plavix for 1-2 months prior to admission. Dr. Dietrich Pates saw the  patient on Jul 12, 2005 and stated the patient was stable to be discharged  home. We will increase his metoprolol to 10 mg b.i.d. All other medications  remain the same. The patient will also have an outpatient Myoview at [facility] and will followup with Dr. Andee Lineman in 1-2 weeks. EKG showed a  normal sinus rhythm with T wave  abnormality in the lateral leads, ventricular rate of 74. The patient's  hemoglobin was 15.3, hematocrit was 45.0. Sodium 136, potassium 4.0,  chloride 106. BUN of 21, creatinine of 1.2 and glucose of 98.   DISCHARGE DIAGNOSIS:  Chest pain with palpitations.     ______________________________  April Humphrey, NP      [place] Bing, M.D. [facility]  Electronically Signed    AH/MEDQ  D:  07/12/2005  T:  07/13/2005  Job:  [id number]   cc:   Learta Codding, M.D. [facility]  [address]  [address]  [place]  [place] [address]   [facility]

## 2010-07-11 NOTE — Consult Note (Signed)
NAME:  [name], [name]                           ACCOUNT NO.:  [id number]   MEDICAL RECORD NO.:  [id number]                   PATIENT TYPE:  INP   LOCATION:  1829                                 FACILITY:  [facility]   PHYSICIAN:  [name], M.D. [facility]           DATE OF BIRTH:  04-Apr-1955   DATE OF CONSULTATION:  03/06/2002  DATE OF DISCHARGE:                                   CONSULTATION   HISTORY:  [name] in consultation for severe depression and LV  systolic function following anterior wall myocardial infarction.   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  He had LAD reperfusion  percutaneously by Dr. Gerri [name] and has actually done beautifully since then  from a functional point of view.  He is able to walk two to three miles in  30 minutes or so, has no nocturnal dyspnea, peripheral edema, has had  occasional times when he has to catch his breath in a sentence but relative  to the other activities it seems unlikely to be cardiac.   He has had no syncope.  He does have a history of recurrent palpitations and  these have been diagnosed as PVCs in the past.  Because of recurrent  palpitations of increasing frequency he presented to the hospital the other  day.   Ejection fraction following his MI was initially 35%.  Cardiolite scanning  demonstrated 45%.  Over the weekend Cardiolite scanning this weekend  demonstrated a significant worsening of his LV function to about 25% without  significant ischemic but because of the change in EF he was submitted for  catheterization today which demonstrated subpleural small OM1, 50% mid RCA  and the LAD lesion was widely patent.  The ejection fraction was  approximately 30%, consistent with 25% seen on Cardiolite scanning.   PAST MEDICAL HISTORY:  The patient's past medical history is  notable for  hypertension and cigarette use.  He has dyslipidemia.   REVIEW OF SYSTEMS:  Noted on the intake sheet both by [name] as well  as by [name] today and is noncontributory.   MEDICATIONS:  Medications at this point include:  1. Altace 2.5 mg.  2. Lopressor 25 mg b.i.d.  3. Aspirin.  4. Plavix 75 mg.  5. Zocor 40 mg.  6. Niacin 500 mg.  7. Zoloft 50 mg h.s.   ALLERGIES:  He has no known drug allergies.   PHYSICAL EXAMINATION:  GENERAL:  On physical examination he is a middle aged  Caucasian male in no acute distress.  VITALS:  His blood pressure is 110/74, pulse 82.  HEENT:  Examination demonstrated no scleral icterus and no xanthomas.  NECK:  Neck veins were flat.  Carotids were brisk and full bilaterally  without bruits.  BACK:  The back was without kyphosis, scoliosis.  LUNGS:  The lungs were clear.  HEART:  Heart sounds were regular, without murmurs or gallops.  ABDOMEN:  The abdomen was soft with active bowel sounds, but there was no  midline pulsations nor hepatomegaly.  EXTREMITIES:  Femoral pulses were 3+, distal pulses were intact.  There was  no clubbing, cyanosis or edema.  NEUROLOGICAL:  The neurological examination was grossly normal.  SKIN:  The skin was warm and dry.   Electrocardiogram dated 03/04/02 demonstrated sinus rhythm at 69 with a  minimal of 0.18/0.10/0.38 with modest ST/T changes in the anterior  precordium.   IMPRESSION:  1. Ischemic cardiomyopathy.     a. Status post anterior MI.     b. EF of 25%.     c. Class I to II symptoms.  2. Narrow QRS.  3. Frequent ventricular ectopy largely asymptomatic.  4. Nonsustained atrial tachycardia.  5. Anxiety.   [name] has severe depression of LV systolic function.  Notwithstanding  his good functional class, he is at significant risk for sudden cardiac  death and indeed disproportionate risk of sudden cardiac death given his  functional capacity.  Based on the MADIT II results,  defibrillator  implantation would be recommended.  We had discussed also possible  enrollment in a master trial for Q wave alternans pre procedural risk  stratification.   His rhythm that was identified as ventricular tachycardia appears to be  atrial tachycardia and no further evaluation for this is necessary.   RECOMMENDATIONS:  1. Agree with catheterization.  2. Careful clarification of his ejection fraction.  3.     Up titration of his ACE inhibitors and his beta blockers as tolerated.  4. ICD has been recommended and discussed with the patient and his family     and we will review that again in the morning.                                               [name], M.D. [facility]    SCK/MEDQ  D:  03/06/2002  T:  03/06/2002  Job:  [id number]   cc:   [name]  [address]  [place]  [place] [address]  Fax: [phone]   [facility]   Electrophysiologic Laboratory   [name], M.D.

## 2010-07-11 NOTE — Discharge Summary (Signed)
[name], [name] NO.:  [id number]   MEDICAL RECORD NO.:  [id number]          PATIENT TYPE:  INP   LOCATION:  6527                         FACILITY:  [facility]   PHYSICIAN:  Learta Codding, M.D. LHCDATE OF BIRTH:  May 15, 1955   DATE OF ADMISSION:  09/16/2004  DATE OF DISCHARGE:  09/19/2004                                 DISCHARGE SUMMARY   PROCEDURES:  1.  Cardiac catheterization.  2.  Coronary arteriogram.  3.  Left ventriculogram.  4.  ZoMax study stent to the RCA.   DISCHARGE DIAGNOSES:  1.  Unstable anginal pain, status post PTCA ZoMax study stent to the right      coronary artery.  2.  History of acute myocardial infarction in 2002 with stent to the left      anterior descending.  3.  Left ventricular dysfunction with an EF of 45% and inferior hypokinesis      at cath this admission  4.  Status post AICD due to left ventricular dysfunction and coronary artery      disease.  5.  Status post cholecystectomy.  6.  Hyperlipidemia.  7.  Hypertension.  8.  History of noncompliance.  9.  History of tobacco use.  10. Chronic back problems.  11. History of VT and V-fib secondary to myocardial infarction.  12. History of rheumatic fever as a child.  13. History of anxiety.   HOSPITAL COURSE:  [name] is a 55 year old male with coronary artery  disease. He had recurrent chest pain that started at rest and was felt  secondary to myocardial ischemia. He was admitted for further evaluation and  treatment.   He had some anterolateral EKG changes and cardiac catheterization was  planned to evaluate his anatomy.   The cardiac catheterization showed no significant in-stent restenosis in the  LAD and nonobstructive disease of 20-30%. There was an 80% stenosis in an OM  that is a small vessel. There was a 75% stenosis in the RCA that was  unchanged with intracoronary nitroglycerin and damped with the catheter. The  films were evaluated by Dr. Riley Kill and Dr.  Diona Browner and it was felt that  percutaneous intervention was indicated.   [name] had a drug-eluting stent placed to the RCA and ZoMax trial. He  tolerated the procedure well and had no problems except for back pain.   The next day he was evaluated by Dr. Andee Lineman. He had a lipid profile  performed and it showed triglycerides of 365 with a LDL of 92. Dr. Andee Lineman  discontinued Niacin and added Tricor to his medication regimen. He is to  continue his other medications with the addition of Plavix. [name] was  evaluated by Dr. Andee Lineman and considered stable for discharge on September 19, 2004  with outpatient follow-up arranged.   DISCHARGE INSTRUCTIONS:  His activity level is to be per the discharge  instruction sheet. He is to stick to a low-fat diet. He is not to drive for  two days and not to lift anything for two weeks. He is to follow up with Dr.  DeGent on September 5 at 1:45 p.m. and get cholesterol and liver testing  just before this visit.   DISCHARGE MEDICATIONS:  1.  Plavix 75 mg daily for a year.  2.  Nitroglycerin sublingual p.r.n.  3.  Lipitor 20 mg q.h.s.  4.  Tricor 145 mg daily.  5.  Niacin is discontinued.  6.  Coated aspirin 325 mg daily.  7.  Toprol XL 100 mg daily.  8.  Aldactone 25 mg daily.  9.  Lexapro 10 mg daily.  10. Altace 10 mg daily.       RB/MEDQ  D:  09/19/2004  T:  09/19/2004  Job:  [id number]   cc:   [facility] [place], [place]

## 2010-07-11 NOTE — H&P (Signed)
NAMEGIANNIS, CORPUZ NO.:  [id number]   MEDICAL RECORD NO.:  [id number]          PATIENT TYPE:  INP   LOCATION:  2033                         FACILITY:  [facility]   PHYSICIAN:  [place] Bing, M.D. [facility] OF BIRTH:  [birth year]   DATE OF ADMISSION:  07/11/2005  DATE OF DISCHARGE:  07/12/2005                                HISTORY & PHYSICAL   REFERRING PHYSICIAN:  Bethann Berkshire, M.D.   PRIMARY CARE PHYSICIAN:  Local physician in [place].   PRIMARY CARDIOLOGIST:  Learta Codding, M.D.   HISTORY OF PRESENT ILLNESS:  A 55 year old gentleman with known coronary  disease presenting with palpitations and chest discomfort.  Mr. Monarrez's  cardiac history dates back to a myocardial infarction in February of 2004,  complicated by ventricular tachycardia and fibrillation.  An ICD was placed.  He did well until mid-2006 when he returned with chest pain and underwent  repeat catheterization.  The stent in his LAD was patent, but he had new  stenosis in the right coronary and in a small first obtuse marginal branch.  A drug eluting stent was placed in the right coronary.  The left ventricular  systolic function was only mildly impaired.  Subsequent stress testing  revealed anteroapical infarction without significant ischemia.  He has  occasional chest discomfort for which he takes nitroglycerin, but none in  recent months.  Yesterday, while sedentary at work, he developed a vague  discomfort in his chest associated with palpitations.  This persisted for a  while and then resolved spontaneously.  Symptoms recurred today, prompting  transport; brought by private car to the emergency department.  At the  present time, he feels a vague chest discomfort, but is not having any  significant symptoms.  There was no radiation.  There was mild associated  dyspnea, but no diaphoresis nor nausea.  He did take two sublingual  nitroglycerin this morning without appreciating any improvement.   Of note,  clopidogrel was discontinued approximately 2 months ago.  The patient has  difficulty in affording expensive medications.   Of note for vascular disease, he has a history of dyslipidemia and  hypertension.  The patient had rheumatic fever as a child.  There is a  history of noncompliance.   PAST MEDICAL HISTORY:  Notable for a laparoscopic cholecystectomy in 2005.   ALLERGIES:  The patient has no known allergies.   MEDICATIONS:  1.  Bisoprolol 10 mg daily.  2.  Furosemide 40 mg b.i.d.  3.  Lisinopril 10 mg daily.  4.  Pravastatin 40 mg daily.  5.  Aspirin 325 mg daily.   SOCIAL HISTORY:  Resides in [place] with his spouse; 6 children.  Works in  Radio producer.  Smokes one-half pack of cigarettes  per day.  No excessive use of alcohol.   FAMILY HISTORY:  Notable for neoplastic disease.  No prominent vascular  disease.   REVIEW OF SYSTEMS:  Entirely negative.   PHYSICAL EXAMINATION:  GENERAL:  A pleasant, trim gentleman in no acute  distress.  VITAL SIGNS:  The temperature is 98.0, heart  rate 65 and regular,  respirations 16, blood pressure 110/65, O2 saturation 97% on room air.  HEENT:  Anicteric sclerae.  Normal lids and conjunctiva.  Normal oral  mucosa.  NECK:  No jugular venous distension.  Normal carotid upstrokes without  bruits.  LUNGS:  Clear.  CARDIAC:  Normal first and second heart sounds; fourth heart sound and  minimal basilar systolic ejection murmur.  ENDOCRINE:  No thyromegaly.  HEMATOPOIETIC:  No adenopathy.  ABDOMEN:  Soft and nontender.  Infraumbilical surgical scar.  No masses, no  organomegaly, no bruits.  EXTREMITIES:  Distal pulses intact; no edema.  NEUROMUSCULAR:  Symmetric strength and tone.  Normal cranial nerves.  MUSCULOSKELETAL:  No joint deformities.   CHEST X-RAY:  No significant abnormalities.   ELECTROCARDIOGRAM:  Sinus rhythm with frequent PVCs, left atrial  abnormality, probable prior septal  myocardial infarction, ST-T wave  abnormalities consistent with apical ischemia.   OTHER LABORATORY:  Normal CBC, normal chemistry profile and normal point of  care markers.   IMPRESSION:  Mr. Ritzel returns with predominantly palpitations and some  chest discomfort.  He will require an observation stay for serial cardiac  markers and EKGs.  We will attempt to increase his dose of beta blocker to  control PVCs.  His dose of ACE inhibitor will be increased.  Clopidogrel  will be given for now, but may not be a long term option.  A lipid profile  will be assessed.  We will encourage discontinuation of tobacco use.  Coronary angiography is not anticipated at the present time.  He may or may  not require stress testing.      [place] Bing, M.D. [facility]  Electronically Signed     RR/MEDQ  D:  07/11/2005  T:  07/13/2005  Job:  [id number]

## 2010-07-11 NOTE — Op Note (Signed)
NAME:  QUENTON, RECENDEZ                           ACCOUNT NO.:  [id number]   MEDICAL RECORD NO.:  [id number]                   PATIENT TYPE:  OIB   LOCATION:  2890                                 FACILITY:  [facility]   PHYSICIAN:  [name], M.D. [facility]           DATE OF BIRTH:  1955-03-20   DATE OF PROCEDURE:  04/12/2002  DATE OF DISCHARGE:                                 OPERATIVE REPORT   PREOPERATIVE DIAGNOSES:  Ischemic heart disease, depressed left ventricular  function, prior myocardial infarction.   POSTOPERATIVE DIAGNOSES:  Ischemic heart disease, depressed left ventricular  function, prior myocardial infarction defibrillator implantation with  intraoperative defibrillation threshold testing.   OPERATION/PROCEDURE:  Defibrillator implantation with primary prevention for  sudden cardiac death, (Master-P).   DESCRIPTION OF PROCEDURE:  After obtaining informed consent, the patient was  brought to the electrophysiology laboratory and placed on the fluoroscopic  table in the supine position.  After routine prep and drape and venogram via  the left antecubital vein identifying the course of the PCS past the left  subclavian vein, lidocaine was infiltrated in the prepectoral, subclavicular  region and the incision was immediately carried down to the layer of the  prepectoral fascia using electrocautery and sharp dissection.  A pocket was  performed similarly.  Hemostasis was obtained.   Thereafter, attention was turned to gaining access to the thoracic left  subclavian which was accomplished without difficulty without the puncture of  the artery or aspiration of air.  A single guide wire was placed in the  cannula.  Silk suture 0 was placed in a figure-of-eight fashion and then a 9  French tear-away reducer sheath was placed which was then passed a  [employer]tron[employer] sprint Quadtro secure at fixation dual coil defibrillator lead,  model No. 6947, serial No. [id number].  Under  fluoroscopic guidance, it was  manipulated into the right ventricular apex.  It should be noted that the  heart was rotated anteriorly significantly and only the RAO could the apical  placement of the lead be identified.  Through the PSA, the Doppler flow R  wave was 12.2 millivolts with a pacing impedence of 835 ohm with a pacing  threshold of 1 volt at 0.5 milliseconds.  Chronic threshold was 1.6 mean.  There was no diaphragmatic pacing at 10 volts.   The lead was then secured to the prepectoral fascia and then secured to a  Medtronic Maximovr 7232 ICD, serial No. [id number].  Through the device of  bipolar, R wave was 6 millivolts and a pacing Impedence of 712 ohms with a  pacing threshold of 0.5 volts at 0.4 milliseconds.  High voltage Impedence  was 48 and the proximal high voltage Impedence was 65 ohms.   With these acceptable parameters were recorded, a defibrillation threshold  testing was undertaken.   The pocket was copiously irrigated with antibiotic-containing saline  solution.  Hemostasis was assured and  the lead and pulse generator was then  placed in the pocket and secured to the prepectoral fascia.  Ventricular  fibrillation was induced via the T wave shock.  After a total duration of  seven seconds, a 16 joule shock was delivered through a measured resistance  of 43 ohm terminating ventricular fibrillation restoring sinus rhythm.   After a wait of five to six minutes, ventricular fibrillation was induced  again via the T wave shock.  After a total duration of six seconds, a 15  joule shock was delivered to a measured resistance of 42 ohm terminating  ventricular fibrillation restoring sinus rhythm.  With these acceptable  findings recorded, the system was implanted.  Hemostasis was again assured  and the wound was then closed in two layers in a normal fashion.  The wound  was washed dry and a Benzoin and Steri-Strips and dressing was then applied.  Needle counts,  sponge counts and instrument counts were correct at the end  of the procedure according to the staff.   The patient tolerated the procedure well without apparent complications.                                                [name], M.D. [facility]    SCK/MEDQ  D:  04/12/2002  T:  04/12/2002  Job:  [id number]   cc:   Electrophysiology Laboratory   [facility]

## 2010-07-11 NOTE — Discharge Summary (Signed)
NAME:  [name], [name]                           ACCOUNT NO.:  [id number]   MEDICAL RECORD NO.:  [id number]                   PATIENT TYPE:  INP   LOCATION:  1829                                 FACILITY:  [facility]   PHYSICIAN:  Jonelle Sidle, M.D. [facility]        DATE OF BIRTH:  [birth year]   DATE OF ADMISSION:  03/04/2002  DATE OF DISCHARGE:  03/07/2002                           DISCHARGE SUMMARY - REFERRING   PROCEDURE:  1. Adenosine Cardiolite on March 05, 2002.  2. Cardiac catheterization on March 06, 2002.   REASON FOR ADMISSION:  The patient is a 55 year old male with a complex  cardiac history, notable for prior acute myocardial infarction in December  2002, complicated by cardiogenic shock and ventricular  tachycardia/fibrillation, who presented to the emergency room with the  complaint of  chest pain in the setting of palpitations.  Please refer to  the dictated admission note for the full details.   LABORATORY DATA:  Normal CBC.  INR 1.7 on admission.  Follow-up INR 1.2.  Potassium 5.6 on admission with a peak of 7.7, and a low of 3.0, and a pre-  discharge level of 4.0.  Normal renal function.  Normal sodium.  Cardiac  enzymes:  Normal MB and troponin I markers x3.  A lipid profile showed a  cholesterol of 147, triglycerides 205, HDL 40, LDL 66, ratio 3.7.  Admission chest x-ray:  No active disease.   HOSPITAL COURSE:  The patient ruled out for a myocardial infarction with  negative serial cardiac enzymes.  He was then referred for a pharmacologic  stress testing.  This revealed no evidence of stress-induced ischemia, with  multiple fixed defects, global hypokinesis with an ejection fraction of  approximately 25%; however, Dr. Veneda Melter recommended proceeding with a  coronary angiography, given the frequent ventricular ectopy and considering  a subsequent electrophysiology study.  A cardiac catheterization was performed on March 06, 2002, by Dr. Charlton Haws.   See the report for full details.  This revealed no critical lesions  and a widely patent stent of the LAD.  An ejection fraction was  approximately 30%.  Dr. Eden Emms concluded that this represented non-ischemic cardiomyopathy and  noting the presenting symptoms of palpitations, in the setting of severe  LVD, recommended proceeding with electrophysiological study and a  consideration of an ICD implantation.  The patient was subsequently evaluated by Dr. [name] and  recommended enrollment in the MASTER trial, with arrangements to be made at  the time of discharge by the [facility].  He also recommended a  TW alternate test in the next one to two weeks.  The patient will also need  a BMET q.72h, for monitoring of hypokalemia.   DISCHARGE MEDICATIONS:  1. Metoprolol 25 mg b.i.d.  2. Aspirin 81 mg q.d.  3. Plavix 75 mg q.d.  4. Niacin 500 mg q.h.s.  5. Zoloft 50 mg  q.h.s.  6. Lipitor 20 mg q.h.s.  7. Ramipril 2.5 mg q.d.   INSTRUCTIONS:  The patient is to refrain from returning to work until  cleared by Dr. Graciela Husbands.  Refrain from lifting anything heavier than 10 pounds  for approximately two weeks.   DIET:  Maintain a low-fat, low-cholesterol diet.   FOLLOW UP:  Arrangements will be made for the patient to have a BMET q.72h.  for monitoring of hypokalemia.  We will also arrange for him to follow up  with the [facility] for enrollment in the MASTER trial.   DISCHARGE DIAGNOSES:  1. Severe ischemic cardiomyopathy.     a. Non-critical coronary artery disease by coronary angiography on        March 06, 2002.     b. Non-ischemic adenosine Cardiolite on March 05, 2002.     c. Severe left ventricular dysfunction with an ejection fraction of        approximately  30%.  d.  Status post anterior myocardial infarction in December 2002, complicated  by cardiogenic shock and ventricular tachycardia/fibrillation, placed on  Coumadin anticoagulation.  1.  Nonsustained ventricular tachycardia.     a. Consideration for MASTER trial enrollment.  2. Hypokalemia.  3. Dyslipidemia.  4. Hypertension.  5. Tobacco.       Gene Serpe, P.A. [facility]                      Jonelle Sidle, M.D. [facility]    GS/MEDQ  D:  03/07/2002  T:  03/07/2002  Job:  [id number]   cc:   Hayden Rasmussen  [address].  [place]  [place] [address]  Fax: [phone]   Heide Guile, M.D.  [place], [place]

## 2010-07-11 NOTE — Op Note (Signed)
[place]  Patient:    [name], [name] Visit Number: [id number] MRN: [id number]          Service Type: DSU Location: [facility] [id number] Attending Physician:  Effie Berkshire Dictated by:   Shela Commons. Kristen Cardinal, D.D.S. Proc. Date: 08/16/01 Admit Date:  08/16/2001 Discharge Date: 08/16/2001                             Operative Report  PREOPERATIVE DIAGNOSIS:  Nonrestorable teeth due to periodontal disease and decay #2, 3, 4, 5, 6, 7, 8, 11, 12, 13, 14, 15, 20, and 21.  POSTOPERATIVE DIAGNOSIS:  Nonrestorable teeth due to periodontal disease and decay #2, 3, 4, 5, 6, 7, 8, 11, 12, 13, 14, 15, 20, and 21.  SURGEON:  Saddie Benders, D.D.S.  PROCEDURE:  Surgical extraction and alveoloplasty of teeth #2, 3, 4, 5, 6, 7, 8, 11, 12, 13, 14, 15, 20, and 21.  DESCRIPTION OF PROCEDURE:  On August 16, 2001, this patient presented himself to the main operating room of [facility] as an outpatient. After obtaining the proper level of anesthesia with the use of nasotracheal intubation, a two inch wet vaginal gauze packing was placed in the oropharynx and local anesthesia was then administered to the right and left sides of the maxilla in the mandibular left quadrant.  Using a #15 blade and a periosteal elevator, a mucoperiosteal flap was raised overlying all remaining maxillary teeth, appropriate elevators and forceps were used to extract these teeth and then the rongeurs and the alveoloplasty bur were used to perform the necessary alveoloplasty in the two maxillary quadrants.  All socket sites were thoroughly curetted, irrigated, tissue trimmed and reapproximated with #4-0 chromic suture in an interrupted fashion.  Attention was then drawn to teeth #20 and 21 where after using a #15 blade and periosteal elevator to raise the mucoperiosteal flap, the bone overlying the facial aspect of these two teeth was removed with a [employer] drill  and rongeurs and they were elevated from their respective socket sites.  The bone was smoothed with rongeurs and a bone file.  The socket site curetted, tissue trimmed, irrigated, and then reapproximated with #4-0 chromic suture in interrupted fashion.  At this point, the entire oral cavity was thoroughly irrigated and suctioned and two inch wet vaginal gauze packing was removed and rolled 4x4 gauze squares were placed in the patients mouth for oral hemostasis.  Time of the procedure was one hour 15 minutes, estimated blood loss was 150 cc.  Anesthesia was general.  Complications were without.  Condition of the patient when last examined was deemed stable. Dictated by:   Shela Commons. Kristen Cardinal, D.D.S. Attending Physician:  Effie Berkshire DD:  08/16/01 TD:  08/16/01 Job: [id number]

## 2010-07-11 NOTE — H&P (Signed)
NAME:  [name], [name]                           ACCOUNT NO.:  [id number]   MEDICAL RECORD NO.:  [id number]                   PATIENT TYPE:  EMS   LOCATION:  MAJO                                 FACILITY:  [facility]   PHYSICIAN:  Charlton Haws, M.D.                  DATE OF BIRTH:  05/08/1955   DATE OF ADMISSION:  11/12/2003  DATE OF DISCHARGE:                                HISTORY & PHYSICAL   PRIMARY CARE PHYSICIAN:  In the near future will be [facility].   PRIMARY CARDIOLOGIST:  Dr. Andee Lineman.   EP PHYSICIAN:  Dr. Graciela Husbands.   SUMMARY OF HISTORY:  [name] is a 55 year old white male who presents to  [facility] emergency room complaining of chest discomfort.  Today at 4:30  p.m. while sitting on the couch he suddenly developed anterior chest sharp  pains radiating into the right chest with mild shortness of breath.  He did  not have any associated nausea, vomiting or diaphoresis.  He took 3 sublingual nitroglycerin over a period of 20 minutes that reduced  his discomfort from a 6 to an approximate 2 or 3 on a scale of 0 to 10.  He  called EMS due to the persistence.  According to the patient he received  baby aspirin, O2, and more sublingual nitroglycerin and his discomfort was  relieved by the time he arrived to the emergency room.  It is noted EMS  report is not available.  He thinks it resembles his MI in 2002 although he  states that he cannot remember very much from that time period and he does  not think this episode is nearly as bad.  The patient does note in the past  several weeks and months he has had occasional episodes of general fatigue  that he does not think is unusual but he has had some various aches and  pains in his shoulders and neck.   PAST MEDICAL HISTORY:   ALLERGIES:  No known drug allergies.   CURRENT MEDICATIONS:  1.  Enteric coated aspirin 81 mg daily.  2.  Toprol XL 100 mg daily.  3.  Lipitor 20 mg q.h.s.  4.  Altace 10 mg daily.  5.  Lexapro 10  daily.  6.  Xanax of unknown dosage or frequency.  7.  Niacin 500 mg b.i.d.  8.  Aldactone 25 mg daily.  The patient states that he recently has not been taking his Niacin and  Aldactone secondary to finances.   PAST MEDICAL HISTORY:  1.  Notable for borderline hypertension.  2.  Hyperlipidemia.  3.  Myocardial infarction in 1998 with angioplasty at [facility],      however specifics are unknown.  4.  He was hospitalized in December of 2002 with a large QT anterior      myocardial infarction associated with cardiogenic shock, ventricular  fibrillation and ventricular tachycardia.  He underwent cardiac      catheterization and an ultimate stent to the LAD.  Specifics in regards      to this admission are pending at the time of this dictation.      Echocardiogram was also performed.  Coumadin was initiated during that      hospitalization.  5.  He had a tooth extraction in 2002.  6.  Adenosine Cardiolite in January, 2004 showed an ejection fraction of      25%, large anterior scar.  7.  Catheterization in January, 2004 showed a 20% left main, 30% proximal      and mid circumflex.  Subtotal small OM1, 30% proximal, 50% mid RCA, EF      30% with anterior apical hypokinesis.  8.  An adenosine Cardiolite was performed in the office on August 22, 2003      showing an EF of 27%, anterior apical and septal akinesis, no ischemia.  9.  He also has a history of a Med[employer] Maximow ICD placement in March,      2004 after entering into the Master Trial.   PHYSICAL EXAMINATION:  GENERAL:  Well-developed, well-nourished, pleasant  white male in no apparent distress.  He is accompanied by his wife.  VITAL SIGNS:  Blood pressure 116/66, pulse 71 and regular, respirations 18.  HEENT:  Normocephalic, atraumatic.  Pupils are equal, round and reactive to  light and accommodation.  EOMs intact, sclerae was clear. He is edentulous  on the top.  NECK:  Supple without thyromegaly, adenopathy,  JVD or carotid bruits.  HEART:  Regular rate and rhythm, normal S1 and S2, no murmurs, rubs, click  or gallops.  All pulses are symmetrical and without bruits.  LUNGS:  Diminished breath sounds throughout, clear to auscultation.  SKIN:  Integument was intact without rashes.  ABDOMEN:  Obese, bowel sounds present without organomegaly, masses or  tenderness.  EXTREMITIES:  Negative cyanosis, clubbing or edema.  MUSCULOSKELETAL:  Unremarkable.  NEURO:  Alert and oriented x 3.  Cranial nerves II-XII are grossly intact.   Chest x-ray showed no active disease; status post AICD.  EKG shows normal  sinus rhythm, normal axis, ventricular rate 76, normal intervals.  Nonspecific ST, T wave changes, essentially no change from January, 2004.   H&H was 14.4 and 41.7, normal indices, platelets 254,000, WBC 5.6 thousand.  Sodium 137, potassium 3.8, BUN 13, creatinine 1.0, glucose 107, normal liver  function tests.  PTT 30, PT 12.0, ER marker negative x1.   IMPRESSION:  1.  Stable angina with initial EKG and ER marker negative for myocardial      infarction.  2.  Anxiety.  3.  Continued tobacco use.  4.  Medication noncompliance secondary to finances.   History is per past medical history.   PLAN:  Dr. Eden Emms reviewed the patient's history, spoke with and examined  the patient.  He feels that we should admit the patient and continue his IV  heparin and nitroglycerin that was started on the emergency room.  We will  rule out myocardial infarction and plan for cardiac catheterization in the  morning.  Since a recent Cardiolite was negative and he continues to have  problems with some chest discomfort, we will also have a tobacco cessation  consult and case manager to look for possible assistance with medications.       EW/MEDQ  D:  11/12/2003  T:  11/13/2003  Job:  [id number]  cc:   [name], M.D.

## 2010-07-11 NOTE — Cardiovascular Report (Signed)
NAME:  [name], [name] NO.:  [id number]   MEDICAL RECORD NO.:  [id number]                   PATIENT TYPE:   LOCATION:                                       FACILITY:   PHYSICIAN:  Charlton Haws, M.D. [facility]              DATE OF BIRTH:  [birth year]   DATE OF PROCEDURE:  03/06/2002  DATE OF DISCHARGE:                              CARDIAC CATHETERIZATION   PROCEDURE:  Cardiac catheterization.   CARDIOLOGIST:  Charlton Haws, M.D.   INDICATIONS FOR PROCEDURE:  Cardiomyopathy.  Catheterization done to rule  out ischemic sub strait prior to possible AICD implantation.   DESCRIPTION OF PROCEDURE:  The cardiac catheterization was done from the  right femoral artery.   RESULTS:  1. Left main coronary artery:  The left main coronary artery had a 20%     discrete lesion.  2. Left anterior descending coronary artery:  There was a stent in the mid-     LAD which was widely patent.  There were scattered 30%-40% multiple     discrete lesions in the distal LAD.  3. Circumflex coronary artery:  The circumflex coronary artery was     codominant.  There were 30% multiple discrete lesions in the proximal and     mid-portion.  The patient had a codominant circumflex, without disease in     the PDA.  There was a small subtotally-occluded first obtuse marginal     branch which was less than 2.0 mm, and appeared to have maybe bridging     collaterals, versus a subtotal occlusion.  4. Right coronary artery:  The right coronary artery was a large and     tortuous vessel.  There was a 30% tubular lesion proximally and there is     a 50% discrete mid-vessel lesion.  The distal vessel was of normal size.   RIGHT VENTRICULOGRAPHY:  A right ventriculography showed an ejection  fraction in the 30% range.  There was severe akinesis of the mid-anterior,  apical, inferior apical, and mid-inferior walls.  There was no mural  thrombus.  There is mild mitral regurgitation.  The blood  pressure is in the  110/60 range.  The LV pressure is in the 110/8 range.   IMPRESSION:  The patient has had a non-ischemic Cardiolite and is not having  chest pain.  His primary symptoms are palpitations, and he has a sub strait  for ventricular tachycardia, along with premature ventricular contractions  on monitoring.    RECOMMENDATIONS:  Given his decreased ejection fraction, I think the patient  warrants an AICD placement.  I do not think intervention or  revascularization is needed before this.  I discussed the case with Dr. [name] and with Dr. Gilford Raid Harsh.  The  patient will most likely proceed with an AICD implantation.  Charlton Haws, M.D. [facility]    PN/MEDQ  D:  03/06/2002  T:  03/06/2002  Job:  [id number]   cc:   Hayden Rasmussen  [address]  [place]  [place] [address]  Fax: [phone]   Heide Guile, M.D.  [place], [place]

## 2010-07-11 NOTE — H&P (Signed)
NAME:  [name], [name] NO.:  [id number]   MEDICAL RECORD NO.:  [id number]                   PATIENT TYPE:   LOCATION:                                       FACILITY:   PHYSICIAN:  Jonelle Sidle, M.D. [facility]        DATE OF BIRTH:  01/20/1956   DATE OF ADMISSION:  03/04/2002  DATE OF DISCHARGE:                                HISTORY & PHYSICAL   PHYSICIANS:  Primary care physician:  Dr. Phineas Real.  Primary cardiologist:  Dr. Salena Saner   CHIEF COMPLAINT:  My heart is skipping.   HISTORY OF PRESENT ILLNESS:  The patient is a 55 year old male with a  history of anterior wall myocardial infarction in 01/2001 complicated by  cardiogenic shock and ventricular tachycardia/ventricular fibrillation.  The  patient underwent stent placement to the left anterior descending at that  time with nonobstructive atherosclerosis in the circumflex and RCA systems  and was ultimately found to have an ejection fraction of 45 to 50% by  followup echocardiogram.  (Initially, the ejection fraction was 35%.)  He  has a history of, by chart review, of palpitations and premature ventricular  contractions.  He presents now with complaints of palpitations described as  forceful heart beats followed by a pause, suggestive of premature  ventricular contractions.  No telemetry strips are available, but the  patient was apparently in ventricular bigeminy when he presented.  He is  currently on a Lidocaine drip.  He also describes some vague intermittent  chest discomfort but not specifically related to exertions and in no  specific progressive pattern.  He has had no syncope.  He continues to work  without any specific limitations at this time.   ALLERGIES:  No known drug allergies.   CURRENT MEDICATIONS:  1. Altace 2.5 mg p.o. daily.  2. Lopressor 25 mg p.o. b.i.d.  3. Coumadin ad directed by Dr. Sylvie Farrier.  4. Lipitor 20 mg p.o. q.h.s.  5. Aspirin 81 mg p.o. daily.  6. Plavix  75 mg p.o. daily.  7. Niaspan 5 mg p.o. daily.  8. Sublingual nitroglycerin as needed.   PAST MEDICAL HISTORY:  1. Coronary artery disease status post anterior wall myocardial infarction     in December 2002 complicated by cardiogenic shock and ventricular     tachycardia/ventricular fibrillation, initial ejection fraction 35%.     Following stent placement to the left anterior descending, ultimately     ejection fraction was noted to be 45 to 50% by 2-D echocardiography.  The     patient has been on Coumadin since his infarction.  He had residual 50%     stenoses in the circumflex and RCA systems.  2. History of palpitations and premature ventricular contractions by     previous Holter monitoring.  3. Dyslipidemia.  4. History of tobacco abuse.  5. Borderline hypertension.   SOCIAL HISTORY:  The patient is married and lives in  [place].  He has six  children.  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  He smokes one to two cigarettes a day by  his account and denies significant alcohol use.   FAMILY HISTORY:  Noncontributory at present.   REVIEW OF SYSTEMS:  As described in History of Present Illness.  He has  gained some weight lately.  He did have upper respiratory tract infection  around Christmas time.   PHYSICAL EXAMINATION:  VITAL SIGNS:  Blood pressure 114/72, heart rate 68  with occasional premature ventricular contractions.  GENERAL:  Well-nourished male lying supine in no acute distress.  HEENT:  Conjunctivae and lids normal.  Oropharynx clear.  NECK:  Supple without elevated jugular venous pressure and no bruits.  No  thyromegaly is noted.  LUNGS:  Clear to auscultation bilaterally without wheezes or rhonchi.  CARDIAC:  Regular rate and rhythm with occasional ectopic beats but no S3  gallop.  ABDOMEN:  Soft and nontender without hepatosplenomegaly or bruits.  EXTREMITIES:  No clubbing, cyanosis, or edema.  Peripheral pulses are 2+.   LABORATORY DATA:  Chest x-ray currently  pending.   A 12-lead electrocardiogram shows normal sinus rhythm at 69 beats per minute  with mildly decreased R wave progression in the septal leads and some ST-T  wave changes anterolaterally which could be reflective of prior infarction.  Cannot entirely exclude ischemia.   Complete blood count is within normal limits.  Troponin I 0.01.  Potassium  5.6, glucose 101, BUN 11, creatinine 0.9.  Total CK 332 with an MB of 2.8  and relative index of 0.8.  INR 1.7.   IMPRESSION:  1. Palpitations with apparent ventricular bigeminy noted on presentation,     now occasional premature ventricular contractions.  The patient has a     known history of this.  2. Vague chest discomfort associated with palpitations with a known history     of coronary artery disease status post previous anterior wall myocardial     infarction in December 2002 status post stent placement in left anterior     descending.  Residual ejection fraction was 45 to 50% by followup     echocardiogram.  The patient has been on Coumadin since his infarction.  3. Dyslipidemia, on statin therapy.  4. Hypertension.  5. Tobacco abuse.   PLAN:  1. Will admit the patient for observation and continue to cycle serial     cardiac enzymes as well as follow telemetry.  2. Discontinue Lidocaine drip.  3. Continue beta blocker therapy, advancing this for optimal heart rate and     blood pressure control.  4. Would most likely discontinue Coumadin as the patient is over a year out     from his anterior wall infarction and showed improvement in overall     ejection fraction by followup echocardiogram.  5. Given history of vague chest discomfort, will most likely proceed with a     Cardiolite assuming cardiac markers     are negative.  His last ischemic assessment was approximately one year     ago.  If cardiac markers are abnormal significantly, then will proceed to    coronary angiography.  The patient probably also needs a  reassessment of     his overall left ventricular performance.  Jonelle Sidle, M.D. [facility]    SGM/MEDQ  D:  03/04/2002  T:  03/04/2002  Job:  [id number]

## 2010-07-11 NOTE — Discharge Summary (Signed)
[name], [name] NO.:  [id number]   MEDICAL RECORD NO.:  [id number]          PATIENT TYPE:  INP   LOCATION:  4703                         FACILITY:  [facility]   PHYSICIAN:  Rollene Rotunda, M.D.   DATE OF BIRTH:  [birth year]   DATE OF ADMISSION:  09/28/2005  DATE OF DISCHARGE:  09/29/2005                                 DISCHARGE SUMMARY   PROCEDURES:  1.  Cardiac catheterization.  2.  Coronary arteriogram.  3.  Left ventriculogram.   PRIMARY DIAGNOSIS:  Chest pain.   SECONDARY DIAGNOSES:  1.  Status post percutaneous transluminal coronary angioplasty to the left      anterior descending in December of 2002.  2.  Percutaneous transluminal coronary angioplasty with a drug-eluting stent      to the right coronary artery in July of 2006.  3.  Ischemic cardiomyopathy with an ejection fraction of 25% at      catheterization this admission.  4.  Status post implantable cardioverter-defibrillator, Medtronic Maxim ICD      in the Master trial.  5.  Hypertension.  6.  Hyperlipidemia.  7.  Medical non adherence including financial considerations and, per his      wife, forgetfulness.  8.  Chronic back pain.  9.  Childhood history of rheumatic fever.  10. Anxiety.  11. Status post laparoscopic cholecystectomy.  12. Ongoing tobacco use.   TIME OF DISCHARGE:  32 minutes.   HOSPITAL COURSE:  [name] is a 55 year old male with a history of coronary  artery disease.  He had chest pain and came to the hospital where he was  admitted for further evaluation and treatment.   His cardiac enzymes were negative for MI and it was felt that he needed a  left heart catheterization which was performed on September 30, 2005.   The cardiac catheterization showed proximal stent with in-stent luminal  irregularities but no significant disease.  There was an ostial 25% stenosis  and a mid 30% stenosis.  The circumflex had a proximal 30% stenosis and the  OM-I was a small vessel  with approximate 99% stenosis that had not changed.  The middle obtuse marginal was a large vessel with ostial luminal  irregularities, and the PL was a large vessel that was normal.  The RCA was  dominant and the proximal stent had luminal irregularity.  There was a 30%  distal stenosis before the PDA which was large and normal.  His EF is 25%.  Dr. Antoine Poche evaluated the films and felt that he had nonobstructive  residual coronary artery disease with no significant progression and severe  left ventricular dysfunction.  Medical management was recommended.  He is to  resume outpatient medications which will be switched to those that are  available from [employer] for $4.00.  They will include an ACE, a beta  blocker, aspirin, and isosorbide.  [name] is encouraged to quit smoking  and states he may try this later.   Post cath, he was without chest pain or shortness of breath.  His groin was  without ecchymosis or  hematoma and he was considered stable for discharge,  with outpatient followup arranged.   DISCHARGE INSTRUCTIONS:  1.  His activity level is to be restricted for the next two days.  2.  He is to call our office for problems with the cath site.  3.  He is to follow up with Dr. Jens Som on August the 22nd at 2:15 and with      Dr. Anna Genre as needed.   DISCHARGE MEDICATIONS:  1.  Coated aspirin 325 mg daily.  2.  Plavix 75 mg daily.  3.  Metoprolol 25 mg p.o. b.i.d.  4.  Pravastatin 40 mg a day.  5.  Lisinopril 10 mg a day.  6.  Lasix 40 mg a day.  7.  Isosorbide 30 mg a day.      Theodore Demark, P.A. [facility]    ______________________________  Rollene Rotunda, M.D.    RB/MEDQ  D:  09/29/2005  T:  09/30/2005  Job:  [id number]   cc:   [facility]

## 2010-07-11 NOTE — H&P (Signed)
[facility]  Patient:    [name], [name] Visit Number: [id number] MRN: [id number]          Service Type: MED Location: 2000 2020 01 Attending Physician:  Nelta Numbers Dictated by:   Gerrit Friends. Dietrich Pates, M.D. [facility] Admit Date:  06/17/2001 Discharge Date: 06/18/2001   CC:         Clydene Fake, M.D.  Dr. Phineas Real   History and Physical  REFERRING PHYSICIAN:  Clydene Fake, M.D.  PRIMARY PHYSICIAN:  Dr. Phineas Real.  PRIMARY [place] CARDIOLOGIST:  Daisey Must, M.D.  HISTORY OF PRESENT ILLNESS:  A 55 year old gentleman who suffered acute anterior myocardial infarction complicated by cardiogenic shock and ventricular arrhythmias in December 2002, treated with stenting of the LAD. HE has done well since hospital discharge, returning to work, and denying all cardiopulmonary symptoms.  He underwent a stress Cardiolite with Dr. Phoebe Perch earlier this year, and that was reportedly fine.  Over the course of the last 24 hours, he was noted with very brief episodes of intermittent right lower chest discomfort with no associated symptoms, no radiation, and no relationship to effort or body positioned.  The pain is relatively mild with episodes lasting a matter of seconds.  He was seen by Dr. Phoebe Perch this morning who recommended a Holter study due to concomitant palpitations.  Symptoms continued, prompting him to present to the emergency department.  The patient has been free of chest discomfort since his myocardial infarction. At the present time, his exercise tolerance is normal.  He did have moderate left ventricular dysfunction during his acute event, but there apparently was some post-MI recovery with his last ejection fraction, thought to be approximately 0.45-0.50.  His attributing conditions to coronary disease include hyperlipidemia, prior tobacco use which was discontinued in December, and borderline hypertension.  CURRENT MEDICATIONS:   Ramipril b.i.d. - dose unknown, _______ 20 mg q.d., metoprolol b.i.d. - dose unknown, Plavix 75 mg q.d., aspirin 81 mg q.d., Warfarin and Niaspan.  PAST MEDICAL HISTORY:  Negative.  SOCIAL HISTORY:  Married and lives in [place] with six children; no excessive use of alcohol.  Works as a Psychologist, forensic.  FAMILY HISTORY:  No significant vascular disease - brother has hypertension.  REVIEW OF SYSTEMS:  The patient reports mild anxiety and depression since cardiac event.  He has had some sensation of swelling to his hands.  He does note mild palpitation.  Otherwise, all other systems negative.  PHYSICAL EXAMINATION:  GENERAL:  A very pleasant, well-appearing gentleman in no acute distress.  VITAL SIGNS:  Heart rate is 66 and regular, blood pressure 115/70, respirations 16.  HEENT:  Anicteric sclerae.  NECK:  No jugular venous distention.  No carotid bruits.  LUNGS:  Clear.  CARDIAC:  Prominent fourth heart sound.  ABDOMEN:  Benign without organomegaly.  EXTREMITIES:  No edema.  Normal distal pulses.  NEUROMUSCULAR:  Symmetric strength and tone.  SKIN:  No significant lesions.  ENDOCRINE:  No thyromegaly.  CHEST X-RAY:  NAD.  EKG:  Normal sinus rhythm.  Prior septal myocardial infarction.  Nonspecific inferolateral ST segment abnormalities.  IMPRESSION:  Atypical symptoms that are worrisome only because they are similar to what he experienced prior to a life-threatening myocardial infarction.  PLAN: The safest course will be an observation stay of 24 hours with empiric treatment to suppress symptoms.  Naprosyn and Prilosec will be started. Serial cardiac markers and EKGs to be obtained.  If he is doing well tomorrow, we  will plan to discharge him back to the care of Dr. Phoebe Perch.  His palpitations appear to be related to signal PVCs - these will be documented. Consideration could be given to discontinuing Warfarin now that the patient is almost six months  following anterior myocardial infarction, especially if both Plavix and aspirin are to be continued indefinitely. Dictated by:   Gerrit Friends. Dietrich Pates, M.D. [facility] Attending Physician:  Nelta Numbers DD:  06/17/01 TD:  06/19/01 Job: [id number]

## 2010-07-11 NOTE — Cardiovascular Report (Signed)
[name], HOCHSTATTER NO.:  [id number]   MEDICAL RECORD NO.:  [id number]          PATIENT TYPE:  INP   LOCATION:  3735                         FACILITY:  [facility]   PHYSICIAN:  Jonelle Sidle, M.D. LHCDATE OF BIRTH:  10-20-1955   DATE OF PROCEDURE:  09/18/2004  DATE OF DISCHARGE:                              CARDIAC CATHETERIZATION   REFERRING PHYSICIAN:  Charlton Haws, M.D.  Primary cardiologist Dr. Andee Lineman.   PRIMARY CARE PHYSICIAN:  [facility].   INDICATIONS:  Mr. Ines is a 55 year old male with known coronary artery  disease, status post previous myocardial infarction and stent placement to  the proximal left anterior descending as well as subsequent defibrillator  placement due to severe cardiomyopathy.  He is admitted now to the hospital  with sudden onset chest discomfort concerning for unstable angina and at  this point has ruled out for myocardial infarction.  He is referred for  diagnostic coronary angiography to clearly define the coronary anatomy.   PROCEDURES PERFORMED:  1.  Left heart catheterization  2.  Selective coronary angiography.  3.  Ventriculography.   ACCESS AND EQUIPMENT:  The area about the right femoral artery was  anesthetized with 1% lidocaine and a 6-French sheath was placed in the right  femoral artery via modified Seldinger technique.  Standard 6-French, JL-4  and JR-4 guide catheters were used for selective coronary angiography.  A  standard JR-4 catheter caused significant damping in the right coronary  artery requiring use of a JR-4 guide with side holes.  An angled pigtail  catheter was used for left heart catheterization and left ventriculography.  All exchanges were made over wire and the patient tolerated procedure well  without any complications.   HEMODYNAMIC RESULTS:  Left ventricle 93/15 mmHg.  Aorta 88/56 mmHg.   ANGIOGRAPHIC FINDINGS:  1.  Left main coronary artery is free of significant flow-limiting  coronary      sclerosis.  2.  The left anterior descending is a medium caliber vessel with three      diagonal branches.  There is a stent in the proximal segment of the      vessel that is patent.  Distal to this is a 20% stenosis as well as a      30% stenosis involving the ostium of the second diagonal branch.  3.  The circumflex coronary artery is a large vessel with three obtuse      marginal branches.  The first obtuse marginal branch is small and has a      fairly focal 80-90% stenosis.  The mid circumflex coronary artery has a      30% stenosis.  Other minor luminal irregularities are noted.  4.  The right coronary artery is a medium caliber vessel that is dominant.      There is approximately 30% stenosis at the proximal portion of the      vessel followed by a focal 75% somewhat hazy stenosis in the mid vessel      segment.  This stenosis did not change in severity with 150 mcg of  intracoronary nitroglycerin.  There is also a distal 20% stenosis noted      in the vessel.   Left ventriculography was performed and the RAO projection revealed an  ejection fraction of approximately 45% with inferobasal hypokinesis and no  significant mitral regurgitation.   DIAGNOSES:  1.  Coronary atherosclerosis as outlined above including a 80% stenosis in a      small first obtuse marginal branch as well as a 75% somewhat hazy      stenosis in the mid right coronary artery.  The stent site in the      proximal left anterior descending is widely patent.  2.  Left ventricular ejection fraction approximately 45% with inferobasal      hypokinesis and left ventricular end-diastolic pressure of 15 mmHg.  No      significant mitral regurgitation.   DISCUSSION:  I reviewed this of the findings with the patient and his wife.  I discussed the results Dr. Riley Kill.  Plan at this point will be to proceed  with attempted at percutaneous coronary intervention to address the 75%  right coronary  stenosis.        SGM/MEDQ  D:  09/18/2004  T:  09/18/2004  Job:  [id number]   cc:   Charlton Haws, M.D.   Learta Codding, M.D. [facility]  [address]  [address]  [place]  [place] [address]   [facility]

## 2010-07-11 NOTE — Assessment & Plan Note (Signed)
[facility]                         ELECTROPHYSIOLOGY OFFICE NOTE   SEGER, JANI                        MRN:          [id number]  DATE:06/21/2006                            DOB:          08-17-1955    Mr. Kensinger is seen.  He is status post ICD implantation for primary  prevention.  He has done well.  He has had no intercurrent therapies.   He denies intercurrent chest pain since having seen Dr. Jens Som on  March 14.   His medications are unchanged except for the up-titration of the  lisinopril from 10 mg to 20 mg.   PHYSICAL EXAMINATION:  VITAL SIGNS:  On examination today, his weight  was 248 pounds.  His blood pressure was 138/80, his pulse is 74.  LUNGS:  Clear.  CARDIAC:  Heart sounds were regular.  EXTREMITIES:  Without edema.   Interrogation of his Medtronic Maximo ICD demonstrates an R wave of 7.6  with an impedance of 432, a threshold of 1 V at 0.3, battery voltage was  3.0.  There was one episode of SVT.   IMPRESSION:  1. Ischemic cardiomyopathy.  2. Status post implantable cardioverter-defibrillator for primary      prevention.   Mr. Cates is stable.  We will plan to see him again in 12 months' time,  and he will be followed remotely in the interim.     [name], MD, [facility]  Electronically Signed    SCK/MedQ  DD: 06/21/2006  DT: 06/22/2006  Job #: [id number]

## 2010-07-11 NOTE — Cardiovascular Report (Signed)
[name], BERGDOLL NO.:  [id number]   MEDICAL RECORD NO.:  [id number]          PATIENT TYPE:  INP   LOCATION:  6527                         FACILITY:  [facility]   PHYSICIAN:  Arturo Morton. Riley Kill, M.D. [facility] OF BIRTH:  1955-07-12   DATE OF PROCEDURE:  09/18/2004  DATE OF DISCHARGE:                              CARDIAC CATHETERIZATION   INDICATIONS:  Mr. Leonhard is a 55 year old gentleman who has previously had an  anterior wall infarction. He was treated with primary intervention. He has  also had placement of a defibrillator for LV dysfunction. He has been  stable, but resumed smoking recently. He now presents with unstable angina  and underwent catheterization by Dr. Diona Browner demonstrating a high-grade  stenosis in the proximal right coronary artery. There was also an  approximately 30-40% narrowing in the distal vessel. He was in the holding  area and risks, benefits, alternatives were discussed. The patient was also  agreeable to randomization in the Zomax II trial. He was therefore brought  to the laboratory for further evaluation.   PROCEDURE:  Percutaneous stenting of the right coronary artery.   DESCRIPTION OF PROCEDURE:  The patient was prepped and draped in the usual  fashion. Using double glove technique, the existing sheath was exchanged for  a 7-French sheath. Bivalirudin was given according to protocol and ACT was  checked and found to be 410 seconds. A Cordis JR-4 guiding catheter with  side holes was utilized to intubate the right coronary. A traverse wire was  used for the markedly tortuous vessel. This was taken out in the distal  artery beyond the area of segmental 30-40% narrowing. The proximal  lesion/mid lesion was predilated using a 2.5 x 12 Maverick balloon. We then  laid a 13 x 3.0 Zomax II study stent across the site and this was taken to  11-12 atmospheres. Post dilatation was then done with 3.25 mm PowerSail with  particularly high  pressure dilatations in the middle of the stent. There was  marked improvement the appearance of the artery. We were careful to dilate  the edges of the stent as well. There were no complications other than the  fact that the patient had a fair amount of back pain throughout the course  of procedure. There was discomfort in the low back which was treated with  Versed, fentanyl, and Dilaudid. He was then taken to the holding area in  satisfactory clinical condition.   ANGIOGRAPHIC DATA:  The right coronary is large-caliber vessel. There is  mild luminal irregularity proximally with about 30% narrowing. Near the  junction of proximal and mid vessel, there was an area of about 90%  narrowing. There is marked tortuosity beyond this point. There is some  luminal irregularity. Just into the distal vessel, there is a segmental area  of plaquing that measures about 30-50% luminal reduction but with a lumen  that  appears to be in excess of 2.3 mm. Following balloon dilatation, the vessel  was stented with reduction in stenosis from 90 to 0%. No complications or  edge tears were noted. Final  angiographic result was excellent.  Intracoronary nitroglycerin was used before initial guiding catheter shots  and late guiding catheter shots.       TDS/MEDQ  D:  09/18/2004  T:  09/18/2004  Job:  [id number]   cc:   Learta Codding, M.D. [facility]  [address]  [address]  [place]  [place] [address]   [facility]   Patient's medical record

## 2010-07-11 NOTE — Discharge Summary (Signed)
NAME:  [name], [name] NO.:  [id number]   MEDICAL RECORD NO.:  [id number]          PATIENT TYPE:  INP   LOCATION:  2015                         FACILITY:  [facility]   PHYSICIAN:  Learta Codding, M.D. LHCDATE OF BIRTH:  12/03/1955   DATE OF ADMISSION:  10/23/2004  DATE OF DISCHARGE:  10/24/2004                                 DISCHARGE SUMMARY   SUMMARY OF HISTORY:  [name] is a 55 year old white male who in the  evening prior to admission developed chest pressure.  He described it as a  heaviness like a balloon expanding in his chest, different from his typical  angina.  After going to bed, he woke up on the morning of admission and his  discomfort had come back and gradually progressed.  He does state that  during the onset, he was under increased stress because of some family  concerns.  He presented for routine blood draw; however, given his symptoms,  he was brought back for further evaluation.   PAST MEDICAL HISTORY:  Notable for coronary. Artery disease with myocardial  infarction in 2002, receiving a stent to the LAD.  Last cardiac  catheterization last summer showed 80% OM in a very small vessel, 75%  proximal RCA complex lesion.  He underwent stent placement to the proximal  RCA and enrolled in the [facility] trial.  Since that discharge, the patient has  not been seen in the office.  There was also a history of some noncompliance  with follow-up and medications.  He has a history of ventricular  fibrillation and ventricular tachycardia with Medtronic ICD placed in 2004.   LABORATORY DATA:  EKGs showed normal sinus rhythm, normal axis, frequent  PVCs, no acute changes.  Admission H&H was 13.4 and 38.5, normal indices,  platelets 256, WBC 5.1.  PTT 31, PT 13.5.  Sodium 139, potassium 4.0, BUN  11, creatinine 1.2, glucose 118.  Alkaline phosphatase was slightly low at  27, and the remainder of his LFTs were within normal limits.  CK total, MB  and troponins were  negative for myocardial infarction x2.  Fasting lipids  showed a total cholesterol of 172, triglycerides 86, HDL 40.1, and LDL 115.  TSH was 1.295.   HOSPITAL COURSE:  [name] was admitted to the unit 2000.  Overnight he did  not have any further chest discomfort.  Enzymes and EKGs were negative for  myocardial infarction.  Telemetry did show nonsustained ventricular  tachycardia.  Tobacco cessation was also performed, given that the patient  was continuing to smoke.  Adenosine Myoview was performed and showed EF of  28%.  Imaging showed extensive scarring in the apex, distal anteroseptal and  distal anterior wall.  There also was noted to be a tiny focus of decreased  perfusion on stress imaging seen in the distal anterolateral wall and apex.  A tiny focus of inducible ischemia could not be excluded.  This was reviewed  with Dr. Andee Lineman, and he felt that after pacer interrogation that the patient  could be discharged home if this did not show any  abnormalities.  Interrogation on August 31 revealed nine ventricular tachycardia episodes;  however, VT is set to monitor, thus no therapies delivered.  It was noted  that on August 23 was his most recent episode of ventricular tachycardia  that lasted longer than 30 seconds.  It was noted that he had had eight of  these episodes.  There were no episodes of ventricular fibrillation.   DISCHARGE DIAGNOSES:  1.  Atypical chest discomfort with adenosine Myoview negative for high-risk      ischemia.  2.  Hyperlipidemia.  3.  Continued tobacco use.  4.  History as previously stated above.   DISPOSITION:  Ms. Pennella is asked to maintain a low salt, fat, cholesterol  diet.  His activities were not restricted.  He was asked to record his daily  weights, bring these weights and all medications to all appointments.  His  Toprol was increased to 150 mg daily and his Altace was decreased to 5 mg  daily.  His other medications include:   1.  Lipitor 20  mg q.h.s.  2.  Tricor 145 mg daily.  3.  Plavix 75 mg daily.  4.  Aspirin 325 mg daily.  5.  Xanax 0.5 mg as needed.  6.  Nitroglycerin 0.4 mg p.r.n.   His October 28, 2004, appointment has been cancelled and he will see Dr.  Andee Lineman in the office on November 11, 2004, at 3:30 p.m.  Consideration  should be given to increasing his Lipitor given his elevated LDL and further  counseling on tobacco cessation.      Joellyn Rued, P.A. [facility]      Learta Codding, M.D. [facility]  Electronically Signed    EW/MEDQ  D:  10/24/2004  T:  10/24/2004  Job:  [id number]   cc:   Dr. Elyn Aquas, [place]

## 2010-07-11 NOTE — Discharge Summary (Signed)
NAME:  YARDEN, HILLIS                           ACCOUNT NO.:  [id number]   MEDICAL RECORD NO.:  [id number]                   PATIENT TYPE:  OIB   LOCATION:  3705                                 FACILITY:  [facility]   PHYSICIAN:  [name], M.D. [facility]           DATE OF BIRTH:  Mar 12, 1955   DATE OF ADMISSION:  04/12/2002  DATE OF DISCHARGE:  04/13/2002                                 DISCHARGE SUMMARY   PRINCIPAL DIAGNOSIS:  Nonsustained ventricular tachycardia.   SECONDARY DIAGNOSES:  1. Status post anterior wall myocardial infarction.  2. Ischemic cardiomyopathy.   HISTORY OF PRESENT ILLNESS:  This is a 55 year old gentleman who had an  anterior wall MI in the fall of 2002.  It was complicated by VT, VF and  cardiogenic shock.  LAD with perfusion percutaneously by Dr. Gerri Spore had  actually done well.  Since then his function improved, he is also able to  walk two to three miles in 30 minutes.  He has no history of syncope.  He  does have a history of recurrent palpitations, diagnosed with PVC's.  EF  following MI was 35%.  Cardiolite demonstrates 45%.  The patient had a  repeat EF in January, 2004 with an EF of 25%.   PAST MEDICAL HISTORY:  Also notable for hypertension and dyslipidemia.   The patient underwent a T wave alternate testing as part of the Master trial  which was positive.  He then was admitted for placement of an internal  cardiac defibrillator.  The patient was admitted on February 18.  He  underwent placement of a Medtronic type single lead ICD.  He tolerated the  procedure well, had no immediate postoperative complications.  ICD values  were within normal limits.  Chest x-ray showed no pneumothorax with the ICD  leads in the RV.   DISPOSITION:  He was discharged to home in stable condition on the following  medications.   1. Coated aspirin 81 mg daily.  2. Toprol XL 100 mg daily.  3. Plavix 75 mg daily.  4. Niacin 500 mg nightly.  5. Zoloft 50 mg  nightly.  6. Lipitor 20 mg nightly.  7. Altace 5 mg daily.  8. Aldactone 25 mg daily.  9. He is to take Tylenol one to two tablets every four to six hours as     needed for pain.   ACTIVITY:  Activity and wound care were per pacemaker discharge sheet.   DIET:  Low fat, low salt, low cholesterol diet.   FOLLOW UP:  He is to follow up in pacemaker clinic at [facility] on March 4 at  9:30 a.m. and he is to see Dr. Graciela Husbands within three months.     [name], C.R.N.P. [facility]                 [name], M.D. [facility]    DS/MEDQ  D:  04/13/2002  T:  04/13/2002  Job:  [id number]   cc:   Gilford Raid Harsh  [address]  [place]  [place] [address]  Fax: [phone]   Arletta Bale, M.D.  [place]  [facility] [employer] [facility]   Pacemaker [employer]

## 2010-07-16 NOTE — Progress Notes (Signed)
icd remote check w/icm  

## 2010-07-31 ENCOUNTER — Encounter: Payer: Self-pay | Admitting: *Deleted

## 2010-09-11 ENCOUNTER — Encounter: Payer: Self-pay | Admitting: Cardiology

## 2010-09-15 ENCOUNTER — Encounter: Payer: Self-pay | Admitting: Cardiology

## 2010-09-15 NOTE — Progress Notes (Signed)
HPI:[name] is a pleasant gentleman who has a history of coronary artery disease, ischemic cardiomyopathy, and history of ICD. His most recent cardiac catheterization was performed in March 2009. At that time, he was found to have an 80% stenosis in the proximal LAD. There is a second diagonal with a 70% ostial stenosis. There was a 50% circumflex and a small first OM branch had an 80-90% stenosis. There was a 50% distal stenosis in the RCA, but the previous stent was patent. He had a PCI of his LAD at that time with a bare-metal stent. Note, the ejection fraction is 30-35%. I last saw him in Dec 2011. Since then, the patient denies any dyspnea on exertion, orthopnea, PND, pedal edema, palpitations, syncope or chest pain.   Current Outpatient Prescriptions  Medication Sig Dispense Refill  . aspirin 325 MG EC tablet Take 325 mg by mouth daily.        . carvedilol (COREG) 12.5 MG tablet Take 12.5 mg by mouth 2 (two) times daily.        . furosemide (LASIX) 20 MG tablet Take 20 mg by mouth daily.        . nitroGLYCERIN (NITROSTAT) 0.4 MG SL tablet Place 0.4 mg under the tongue every 5 (five) minutes as needed.        . simvastatin (ZOCOR) 80 MG tablet Take 80 mg by mouth daily.           Past Medical History  Diagnosis Date  . Automatic implantable cardiac defibrillator in situ     medtronic; [facility]  . HTN (hypertension)   . HLD (hyperlipidemia) mixed  . Cardiomyopathy     ischemic  . CAD (coronary artery disease)   . Anxiety     Past Surgical History  Procedure Date  . Laproscopic cholecystectomy   . Defibrillator implantation 04/12/02    primary prevention for sudden death; Dr. Graciela Husbands     History   Social History  . Marital Status: Married    Spouse Name: N/A    Number of Children: N/A  . Years of Education: N/A   Occupational History  . Not on file.   Social History Main Topics  . Smoking status: Smoker, Current Status Unknown  . Smokeless tobacco: Not on file  .  Alcohol Use: Yes     occas  . Drug Use: No  . Sexually Active: Not on file   Other Topics Concern  . Not on file   Social History Narrative   Married; full time.     ROS: no fevers or chills, productive cough, hemoptysis, dysphasia, odynophagia, melena, hematochezia, dysuria, hematuria, rash, seizure activity, orthopnea, PND, pedal edema, claudication. Remaining systems are negative.  Physical Exam: Well-developed well-nourished in no acute distress.  Skin is warm and dry.  HEENT is normal.  Neck is supple. No thyromegaly.  Chest is clear to auscultation with normal expansion.  Cardiovascular exam is regular rate and rhythm.  Abdominal exam nontender or distended. No masses palpated. Extremities show no edema. neuro grossly intact  ECG     This encounter was created in error - please disregard.

## 2010-09-18 ENCOUNTER — Encounter: Payer: Self-pay | Admitting: Cardiology

## 2010-10-09 ENCOUNTER — Encounter: Payer: Self-pay | Admitting: *Deleted

## 2010-10-13 ENCOUNTER — Encounter: Payer: Self-pay | Admitting: *Deleted

## 2010-10-20 ENCOUNTER — Encounter: Payer: Self-pay | Admitting: Internal Medicine

## 2010-10-20 ENCOUNTER — Ambulatory Visit (INDEPENDENT_AMBULATORY_CARE_PROVIDER_SITE_OTHER): Payer: Self-pay | Admitting: *Deleted

## 2010-10-20 ENCOUNTER — Other Ambulatory Visit: Payer: Self-pay | Admitting: Internal Medicine

## 2010-10-20 DIAGNOSIS — I509 Heart failure, unspecified: Secondary | ICD-10-CM

## 2010-10-20 DIAGNOSIS — I428 Other cardiomyopathies: Secondary | ICD-10-CM

## 2010-10-21 LAB — REMOTE ICD DEVICE
BRDY-0002RV: 40 {beats}/min
CHARGE TIME: 9 s
DEV-0020ICD: NEGATIVE
DEV-0020ICD: NEGATIVE
RV LEAD IMPEDENCE ICD: 475 Ohm
TZAT-0001SLOWVT: 1
TZAT-0001SLOWVT: 1
TZAT-0002FASTVT: NEGATIVE
TZAT-0002SLOWVT: NEGATIVE
TZAT-0012FASTVT: 200 ms
TZAT-0012FASTVT: 200 ms
TZAT-0012SLOWVT: 200 ms
TZAT-0018FASTVT: NEGATIVE
TZAT-0018FASTVT: NEGATIVE
TZAT-0018SLOWVT: NEGATIVE
TZAT-0018SLOWVT: NEGATIVE
TZAT-0019FASTVT: 8 V
TZAT-0019FASTVT: 8 V
TZAT-0020FASTVT: 1.5 ms
TZAT-0020SLOWVT: 1.5 ms
TZON-0003SLOWVT: 360 ms
TZON-0003VSLOWVT: 450 ms
TZON-0004SLOWVT: 16
TZON-0004VSLOWVT: 32
TZON-0005SLOWVT: 12
TZST-0001FASTVT: 2
TZST-0001FASTVT: 3
TZST-0001FASTVT: 5
TZST-0001FASTVT: 6
TZST-0001SLOWVT: 2
TZST-0001SLOWVT: 3
TZST-0001SLOWVT: 4
TZST-0001SLOWVT: 4
TZST-0001SLOWVT: 5
TZST-0001SLOWVT: 5
TZST-0001SLOWVT: 6
TZST-0001SLOWVT: 6
TZST-0002FASTVT: NEGATIVE
TZST-0002FASTVT: NEGATIVE
TZST-0002FASTVT: NEGATIVE
TZST-0002FASTVT: NEGATIVE
TZST-0002FASTVT: NEGATIVE
TZST-0002FASTVT: NEGATIVE
TZST-0002SLOWVT: NEGATIVE
TZST-0002SLOWVT: NEGATIVE
TZST-0002SLOWVT: NEGATIVE
TZST-0002SLOWVT: NEGATIVE

## 2010-10-22 ENCOUNTER — Encounter: Payer: Self-pay | Admitting: *Deleted

## 2010-11-04 NOTE — Progress Notes (Signed)
ICD/ICM was checked by remote

## 2010-11-17 LAB — BASIC METABOLIC PANEL
BUN: 14
CO2: 25
Calcium: 8.6
Calcium: 9
Creatinine, Ser: 0.95
Creatinine, Ser: 1.06
GFR calc Af Amer: >60
GFR calc Af Amer: >60
Glucose, Bld: 114 — ABNORMAL HIGH

## 2010-11-17 LAB — I-STAT 8, (EC8 V) (CONVERTED LAB)
BUN: 19
Glucose, Bld: 91
Hemoglobin: 13.9
Potassium: 4.2
Sodium: 137
TCO2: 21

## 2010-11-17 LAB — LIPID PANEL
Cholesterol: 209 — ABNORMAL HIGH
LDL Cholesterol: 126 — ABNORMAL HIGH

## 2010-11-17 LAB — DIFFERENTIAL
Basophils Relative: 1
Eosinophils Absolute: 0.3
Monocytes Absolute: 0.7
Monocytes Relative: 7

## 2010-11-17 LAB — CBC
Hemoglobin: 13.5
MCHC: 34.3
MCHC: 35.4
MCV: 87.7
Platelets: 218
Platelets: 220
RBC: 3.9 — ABNORMAL LOW
RBC: 4.45
RDW: 13.4

## 2010-11-17 LAB — APTT: aPTT: 29

## 2010-11-17 LAB — COMPREHENSIVE METABOLIC PANEL
ALT: 25
AST: 26
Albumin: 3.9
Alkaline Phosphatase: 36 — ABNORMAL LOW
Potassium: 4.1
Sodium: 138
Total Protein: 6.6

## 2010-11-17 LAB — CARDIAC PANEL(CRET KIN+CKTOT+MB+TROPI)
CK, MB: 11.5 — ABNORMAL HIGH
Relative Index: 3.5 — ABNORMAL HIGH
Total CK: 380 — ABNORMAL HIGH
Troponin I: 1.21

## 2010-11-17 LAB — CK TOTAL AND CKMB (NOT AT ARMC)
CK, MB: 3.6
Relative Index: 1.2

## 2010-11-17 LAB — TSH: TSH: 2.681

## 2011-01-22 ENCOUNTER — Encounter: Payer: Self-pay | Admitting: *Deleted

## 2011-01-27 ENCOUNTER — Encounter: Payer: Self-pay | Admitting: *Deleted

## 2011-02-01 ENCOUNTER — Encounter: Payer: Self-pay | Admitting: Internal Medicine

## 2011-02-01 ENCOUNTER — Other Ambulatory Visit: Payer: Self-pay | Admitting: Internal Medicine

## 2011-02-02 ENCOUNTER — Ambulatory Visit (INDEPENDENT_AMBULATORY_CARE_PROVIDER_SITE_OTHER): Payer: Self-pay | Admitting: *Deleted

## 2011-02-02 DIAGNOSIS — I472 Ventricular tachycardia: Secondary | ICD-10-CM

## 2011-02-02 DIAGNOSIS — I509 Heart failure, unspecified: Secondary | ICD-10-CM

## 2011-02-05 LAB — REMOTE ICD DEVICE
DEV-0020ICD: NEGATIVE
FVT: 0
TOT-0001: 2
TZAT-0001SLOWVT: 1
TZAT-0002FASTVT: NEGATIVE
TZAT-0002SLOWVT: NEGATIVE
TZAT-0012SLOWVT: 200 ms
TZAT-0018FASTVT: NEGATIVE
TZAT-0018SLOWVT: NEGATIVE
TZAT-0019FASTVT: 8 V
TZON-0004SLOWVT: 16
TZST-0001FASTVT: 6
TZST-0001SLOWVT: 2
TZST-0001SLOWVT: 4
TZST-0001SLOWVT: 5
TZST-0001SLOWVT: 6
TZST-0002FASTVT: NEGATIVE
TZST-0002FASTVT: NEGATIVE
TZST-0002FASTVT: NEGATIVE
TZST-0002SLOWVT: NEGATIVE
TZST-0002SLOWVT: NEGATIVE
VENTRICULAR PACING ICD: 0 pct
VF: 0

## 2011-02-11 ENCOUNTER — Telehealth: Payer: Self-pay | Admitting: Cardiology

## 2011-02-11 MED ORDER — LISINOPRIL 20 MG PO TABS: 20.0000 mg | ORAL_TABLET | Freq: Every day | ORAL | Status: DC

## 2011-02-11 MED ORDER — CARVEDILOL 12.5 MG PO TABS: 12.5000 mg | ORAL_TABLET | Freq: Two times a day (BID) | ORAL | Status: DC

## 2011-02-11 MED ORDER — FUROSEMIDE 20 MG PO TABS: 20.0000 mg | ORAL_TABLET | Freq: Every day | ORAL | Status: DC

## 2011-02-11 NOTE — Telephone Encounter (Signed)
New refill Pt wants refill Carvedilol lisinipril Furosemide [facility] in [place] [phone]

## 2011-02-12 NOTE — Progress Notes (Signed)
Remote icd check w/icm  

## 2011-02-27 ENCOUNTER — Encounter: Payer: Self-pay | Admitting: *Deleted

## 2011-05-22 ENCOUNTER — Telehealth: Payer: Self-pay | Admitting: Nurse Practitioner

## 2011-05-22 NOTE — Telephone Encounter (Signed)
Pt called this AM stating that @ approx 2 AM, while sleeping, he was awakened suddenly when his ICD went off.  Upon awakening, he felt a little lightheaded but this resolved quickly.  He had no chest pain or sob and has had no recurrence.  He says that he sent a remote transmission.  I advised that if he has any recurrent shocks that he is to present to the ED.  Otherwise, I will forward this message to our schedulers so that we may schedule him for an appt early next week.  He verbalized understanding.

## 2011-05-29 ENCOUNTER — Other Ambulatory Visit: Payer: Self-pay | Admitting: Internal Medicine

## 2011-05-29 ENCOUNTER — Encounter: Payer: Self-pay | Admitting: Internal Medicine

## 2011-05-29 ENCOUNTER — Encounter: Payer: Self-pay | Admitting: *Deleted

## 2011-05-29 ENCOUNTER — Ambulatory Visit (INDEPENDENT_AMBULATORY_CARE_PROVIDER_SITE_OTHER): Payer: Self-pay | Admitting: Internal Medicine

## 2011-05-29 VITALS — BP 140/78 | HR 82 | Ht 71.0 in | Wt 242.4 lb

## 2011-05-29 DIAGNOSIS — I10 Essential (primary) hypertension: Secondary | ICD-10-CM

## 2011-05-29 DIAGNOSIS — I472 Ventricular tachycardia, unspecified: Secondary | ICD-10-CM

## 2011-05-29 DIAGNOSIS — I4729 Other ventricular tachycardia: Secondary | ICD-10-CM

## 2011-05-29 DIAGNOSIS — I2589 Other forms of chronic ischemic heart disease: Secondary | ICD-10-CM

## 2011-05-29 DIAGNOSIS — Z95810 Presence of automatic (implantable) cardiac defibrillator: Secondary | ICD-10-CM

## 2011-05-29 DIAGNOSIS — I255 Ischemic cardiomyopathy: Secondary | ICD-10-CM

## 2011-05-29 MED ORDER — NITROGLYCERIN 0.4 MG SL SUBL: 0.4000 mg | SUBLINGUAL_TABLET | SUBLINGUAL | Status: DC | PRN

## 2011-05-29 MED ORDER — SIMVASTATIN 80 MG PO TABS: 80.0000 mg | ORAL_TABLET | Freq: Every day | ORAL | Status: DC

## 2011-05-29 MED ORDER — CARVEDILOL 12.5 MG PO TABS: 25.0000 mg | ORAL_TABLET | Freq: Two times a day (BID) | ORAL | Status: DC

## 2011-05-29 NOTE — Patient Instructions (Signed)
Your physician has requested that you have a cardiac catheterization. Cardiac catheterization is used to diagnose and/or treat various heart conditions. Doctors may recommend this procedure for a number of different reasons. The most common reason is to evaluate chest pain. Chest pain can be a symptom of coronary artery disease (CAD), and cardiac catheterization can show whether plaque is narrowing or blocking your heart's arteries. This procedure is also used to evaluate the valves, as well as measure the blood flow and oxygen levels in different parts of your heart. For further information please visit [email or web address] Please follow instruction sheet, as given.   INCREASE CARVEDILOL TO 25 MG TWICE DAILY  Your physician recommends that you HAVE LAB WORK TODAY

## 2011-05-29 NOTE — Assessment & Plan Note (Signed)
The patient has had ventricular flutter. There are number of issues. The first is is there underlying change in substrate for electrical milieu and today we will check his metabolic profile magnesium thyroid status as well as undertake catheterization as noted above

## 2011-05-29 NOTE — Assessment & Plan Note (Signed)
The patient has had a marked increase in ventricular ectopy as well as an episode of sustained ventricular flutter last week which likely could have a life ending. He has had some problems with chest discomfort as well as shoulder and neck discomfort reminiscent of his angina and presenting symptom with his MI. We'll undertake catheterization with Dr. Excell Seltzer who did his last procedure in 2010 at which time his LAD was stented

## 2011-05-29 NOTE — Assessment & Plan Note (Signed)
Blood pressure somewhat elevated. For this as well as his ventricular tachycardia and ischemic cardiomyopathy and recent data from the MADIT-CRT, we'll increase his carvedilol drom 12.5-25 mg a day

## 2011-05-29 NOTE — Assessment & Plan Note (Signed)
The patient's device was interrogated.  The information was reviewed. No changes were made in the programming.    Given his ICD discharges recommended not to drive for 6 months

## 2011-05-29 NOTE — Progress Notes (Signed)
  HPI  [name] is a 56 y.o. male is seen in followup for ICD implanted as per the MASTER trial with prior myocardial infarction complicated by shock and prior stenting.  Underwent catheterization in 2010demonstrated ejection fraction of 35% and. Severe LAD stenosis secondary to acute thrombus.  wqith patent RCA stent  Has complaints  of midsternal chest discomfort occasionally associated with shortness of breath as well as complaints of shoulder and neck discomfort which his wife reminds us was his presenting pain with his MI. There has been impaired exertion but the chest discomfort has not necessarily been associated with it.     Past Medical History  Diagnosis Date  . Automatic implantable cardiac defibrillator in situ     medtronic; [facility]  . HTN (hypertension)   . HLD (hyperlipidemia) mixed  . Cardiomyopathy     ischemic  . CAD (coronary artery disease)   . Anxiety     Past Surgical History  Procedure Date  . Laproscopic cholecystectomy   . Defibrillator implantation 04/12/02    primary prevention for sudden death; Dr. Adrijana Haros     Current Outpatient Prescriptions  Medication Sig Dispense Refill  . aspirin 325 MG EC tablet Take 325 mg by mouth daily.        . carvedilol (COREG) 12.5 MG tablet Take 1 tablet (12.5 mg total) by mouth 2 (two) times daily.  60 tablet  12  . furosemide (LASIX) 20 MG tablet Take 1 tablet (20 mg total) by mouth daily.  30 tablet  12  . lisinopril (PRINIVIL,ZESTRIL) 20 MG tablet Take 1 tablet (20 mg total) by mouth daily.  30 tablet  12  . nitroGLYCERIN (NITROSTAT) 0.4 MG SL tablet Place 0.4 mg under the tongue every 5 (five) minutes as needed.        . simvastatin (ZOCOR) 80 MG tablet Take 80 mg by mouth daily.          No Known Allergies  Review of Systems negative except from HPI and PMH  Physical Exam BP 140/78  Pulse 82  Ht 5' 11" (1.803 m)  Wt 242 lb 6.4 oz (109.952 kg)  BMI 33.81 kg/m2 Well developed and well nourished in no  acute distress HENT normal E scleral and icterus clear Neck Supple JVP7-8 cm carotids brisk and full Clear to ausculation Regular rate and rhythm, no murmurs gallops or rub Soft with active bowel sounds No clubbing cyanosis none Edema Alert and oriented, grossly normal motor and sensory function Skin Warm and Dry   ECG demonstrates sinus rhythm with Q waves in lead V1 and V2 which are old. Assessment and  Plan  

## 2011-05-30 LAB — BASIC METABOLIC PANEL WITH GFR
BUN: 25 mg/dL — ABNORMAL HIGH (ref 6–23)
CO2: 22 mEq/L (ref 19–32)
Chloride: 109 mEq/L (ref 96–112)
Glucose, Bld: 82 mg/dL (ref 70–99)
Potassium: 4.8 mEq/L (ref 3.5–5.3)

## 2011-05-30 LAB — HEPATIC FUNCTION PANEL
AST: 17 U/L (ref 0–37)
Albumin: 4.1 g/dL (ref 3.5–5.2)
Bilirubin, Direct: 0.1 mg/dL (ref 0.0–0.3)
Total Bilirubin: 0.3 mg/dL (ref 0.3–1.2)

## 2011-05-30 LAB — CBC
HCT: 42.2 % (ref 39.0–52.0)
Hemoglobin: 14.2 g/dL (ref 13.0–17.0)
MCH: 30.8 pg (ref 26.0–34.0)
MCHC: 33.6 g/dL (ref 30.0–36.0)
RBC: 4.61 MIL/uL (ref 4.22–5.81)

## 2011-05-30 LAB — MAGNESIUM: Magnesium: 2 mg/dL (ref 1.5–2.5)

## 2011-06-01 ENCOUNTER — Encounter (HOSPITAL_COMMUNITY)
Admission: RE | Disposition: A | Discharge status: Home / Self Care | Payer: Self-pay | Source: Ambulatory Visit | Attending: Cardiology

## 2011-06-01 ENCOUNTER — Encounter (HOSPITAL_BASED_OUTPATIENT_CLINIC_OR_DEPARTMENT_OTHER)
Admission: RE | Disposition: A | Discharge status: Hospice | Payer: Self-pay | Source: Ambulatory Visit | Attending: Cardiology

## 2011-06-01 ENCOUNTER — Other Ambulatory Visit: Payer: Self-pay

## 2011-06-01 ENCOUNTER — Ambulatory Visit (HOSPITAL_COMMUNITY)
Admission: RE | Admit: 2011-06-01 | Discharge: 2011-06-02 | Disposition: A | Discharge status: Home / Self Care | Payer: Self-pay | Source: Ambulatory Visit | Attending: Cardiology | Admitting: Cardiology

## 2011-06-01 ENCOUNTER — Inpatient Hospital Stay (HOSPITAL_BASED_OUTPATIENT_CLINIC_OR_DEPARTMENT_OTHER)
Admission: RE | Admit: 2011-06-01 | Discharge: 2011-06-01 | Disposition: A | Discharge status: Hospice | Payer: Self-pay | Source: Ambulatory Visit | Attending: Cardiology | Admitting: Cardiology

## 2011-06-01 DIAGNOSIS — F411 Generalized anxiety disorder: Secondary | ICD-10-CM | POA: Insufficient documentation

## 2011-06-01 DIAGNOSIS — I255 Ischemic cardiomyopathy: Secondary | ICD-10-CM | POA: Insufficient documentation

## 2011-06-01 DIAGNOSIS — I2589 Other forms of chronic ischemic heart disease: Secondary | ICD-10-CM | POA: Insufficient documentation

## 2011-06-01 DIAGNOSIS — Z9861 Coronary angioplasty status: Secondary | ICD-10-CM | POA: Insufficient documentation

## 2011-06-01 DIAGNOSIS — Z95810 Presence of automatic (implantable) cardiac defibrillator: Secondary | ICD-10-CM | POA: Insufficient documentation

## 2011-06-01 DIAGNOSIS — I10 Essential (primary) hypertension: Secondary | ICD-10-CM | POA: Insufficient documentation

## 2011-06-01 DIAGNOSIS — I2510 Atherosclerotic heart disease of native coronary artery without angina pectoris: Secondary | ICD-10-CM

## 2011-06-01 DIAGNOSIS — F17200 Nicotine dependence, unspecified, uncomplicated: Secondary | ICD-10-CM | POA: Insufficient documentation

## 2011-06-01 DIAGNOSIS — I209 Angina pectoris, unspecified: Secondary | ICD-10-CM | POA: Insufficient documentation

## 2011-06-01 DIAGNOSIS — I252 Old myocardial infarction: Secondary | ICD-10-CM | POA: Insufficient documentation

## 2011-06-01 DIAGNOSIS — E785 Hyperlipidemia, unspecified: Secondary | ICD-10-CM | POA: Insufficient documentation

## 2011-06-01 DIAGNOSIS — I472 Ventricular tachycardia: Secondary | ICD-10-CM

## 2011-06-01 DIAGNOSIS — I200 Unstable angina: Secondary | ICD-10-CM

## 2011-06-01 HISTORY — PX: PERCUTANEOUS CORONARY STENT INTERVENTION (PCI-S): SHX5485

## 2011-06-01 HISTORY — DX: Acute myocardial infarction, unspecified: I21.9

## 2011-06-01 LAB — CBC
HCT: 36.6 % — ABNORMAL LOW (ref 39.0–52.0)
Hemoglobin: 12.4 g/dL — ABNORMAL LOW (ref 13.0–17.0)
MCH: 30.4 pg (ref 26.0–34.0)
MCHC: 33.9 g/dL (ref 30.0–36.0)
MCV: 89.7 fL (ref 78.0–100.0)
RBC: 4.08 MIL/uL — ABNORMAL LOW (ref 4.22–5.81)

## 2011-06-01 SURGERY — PERCUTANEOUS CORONARY STENT INTERVENTION (PCI-S)
Anesthesia: LOCAL

## 2011-06-01 SURGERY — JV LEFT HEART CATHETERIZATION WITH CORONARY ANGIOGRAM
Anesthesia: Moderate Sedation

## 2011-06-01 MED ORDER — LIDOCAINE HCL (PF) 1 % IJ SOLN
INTRAMUSCULAR | Status: AC
  Filled 2011-06-01: qty 30

## 2011-06-01 MED ORDER — NITROGLYCERIN 0.4 MG SL SUBL: 0.4000 mg | SUBLINGUAL_TABLET | SUBLINGUAL | Status: DC | PRN

## 2011-06-01 MED ORDER — CLOPIDOGREL BISULFATE 75 MG PO TABS
75.0000 mg | ORAL_TABLET | Freq: Every day | ORAL | Status: DC
  Administered 2011-06-02: 75 mg via ORAL
  Filled 2011-06-01: qty 1

## 2011-06-01 MED ORDER — ONDANSETRON HCL 4 MG/2ML IJ SOLN: 4.0000 mg | Freq: Four times a day (QID) | INTRAMUSCULAR | Status: DC | PRN

## 2011-06-01 MED ORDER — MIDAZOLAM HCL 2 MG/2ML IJ SOLN
INTRAMUSCULAR | Status: AC
  Filled 2011-06-01: qty 2

## 2011-06-01 MED ORDER — SODIUM CHLORIDE 0.9 % IJ SOLN: 3.0000 mL | Freq: Two times a day (BID) | INTRAMUSCULAR | Status: DC

## 2011-06-01 MED ORDER — CLOPIDOGREL BISULFATE 300 MG PO TABS
ORAL_TABLET | ORAL | Status: AC
  Filled 2011-06-01: qty 2

## 2011-06-01 MED ORDER — ALPRAZOLAM 0.25 MG PO TABS: 0.2500 mg | ORAL_TABLET | Freq: Two times a day (BID) | ORAL | Status: DC | PRN

## 2011-06-01 MED ORDER — ACETAMINOPHEN 325 MG PO TABS
650.0000 mg | ORAL_TABLET | ORAL | Status: DC | PRN
  Filled 2011-06-01: qty 2

## 2011-06-01 MED ORDER — HYDROCODONE-ACETAMINOPHEN 5-325 MG PO TABS
1.0000 | ORAL_TABLET | ORAL | Status: DC | PRN
  Administered 2011-06-01: 2 via ORAL
  Administered 2011-06-02: 1 via ORAL
  Filled 2011-06-01: qty 2
  Filled 2011-06-01: qty 1

## 2011-06-01 MED ORDER — SODIUM CHLORIDE 0.9 % IV SOLN: 250.0000 mL | INTRAVENOUS | Status: DC | PRN

## 2011-06-01 MED ORDER — LISINOPRIL 20 MG PO TABS
20.0000 mg | ORAL_TABLET | Freq: Every day | ORAL | Status: DC
  Administered 2011-06-01 – 2011-06-02 (×2): 20 mg via ORAL
  Filled 2011-06-01 (×2): qty 1

## 2011-06-01 MED ORDER — SODIUM CHLORIDE 0.9 % IV SOLN
INTRAVENOUS | Status: DC
  Administered 2011-06-01: 09:00:00 via INTRAVENOUS

## 2011-06-01 MED ORDER — ASPIRIN 81 MG PO CHEW
81.0000 mg | CHEWABLE_TABLET | Freq: Every day | ORAL | Status: DC
  Administered 2011-06-01 – 2011-06-02 (×2): 81 mg via ORAL
  Filled 2011-06-01 (×2): qty 1

## 2011-06-01 MED ORDER — ASPIRIN 81 MG PO CHEW
324.0000 mg | CHEWABLE_TABLET | ORAL | Status: AC
  Administered 2011-06-01: 324 mg via ORAL

## 2011-06-01 MED ORDER — HYDROMORPHONE HCL PF 2 MG/ML IJ SOLN
INTRAMUSCULAR | Status: AC
  Filled 2011-06-01: qty 1

## 2011-06-01 MED ORDER — BIVALIRUDIN 250 MG IV SOLR
INTRAVENOUS | Status: AC
  Filled 2011-06-01: qty 250

## 2011-06-01 MED ORDER — SODIUM CHLORIDE 0.9 % IJ SOLN: 3.0000 mL | INTRAMUSCULAR | Status: DC | PRN

## 2011-06-01 MED ORDER — HYDROMORPHONE HCL 1 MG/ML PO LIQD: 1.0000 mg | ORAL | Status: DC | PRN

## 2011-06-01 MED ORDER — FUROSEMIDE 20 MG PO TABS
20.0000 mg | ORAL_TABLET | Freq: Every day | ORAL | Status: DC
  Administered 2011-06-01 – 2011-06-02 (×2): 20 mg via ORAL
  Filled 2011-06-01 (×2): qty 1

## 2011-06-01 MED ORDER — HEPARIN (PORCINE) IN NACL 2-0.9 UNIT/ML-% IJ SOLN
INTRAMUSCULAR | Status: AC
  Filled 2011-06-01: qty 2000

## 2011-06-01 MED ORDER — NITROGLYCERIN 0.2 MG/ML ON CALL CATH LAB
INTRAVENOUS | Status: AC
  Filled 2011-06-01: qty 1

## 2011-06-01 MED ORDER — SODIUM CHLORIDE 0.9 % IV SOLN
INTRAVENOUS | Status: AC
  Administered 2011-06-01: 12:00:00 via INTRAVENOUS

## 2011-06-01 MED ORDER — FENTANYL CITRATE 0.05 MG/ML IJ SOLN
INTRAMUSCULAR | Status: AC
  Filled 2011-06-01: qty 2

## 2011-06-01 MED ORDER — ATORVASTATIN CALCIUM 40 MG PO TABS
40.0000 mg | ORAL_TABLET | Freq: Every day | ORAL | Status: DC
  Administered 2011-06-01: 40 mg via ORAL
  Filled 2011-06-01 (×2): qty 1

## 2011-06-01 MED ORDER — FAMOTIDINE IN NACL 20-0.9 MG/50ML-% IV SOLN
INTRAVENOUS | Status: AC
  Filled 2011-06-01: qty 50

## 2011-06-01 MED ORDER — CARVEDILOL 25 MG PO TABS
25.0000 mg | ORAL_TABLET | Freq: Two times a day (BID) | ORAL | Status: DC
  Administered 2011-06-01 – 2011-06-02 (×2): 25 mg via ORAL
  Filled 2011-06-01 (×4): qty 1

## 2011-06-01 NOTE — H&P (View-Only) (Signed)
  HPI  [name] is a 56 y.o. male is seen in followup for ICD implanted as per the MASTER trial with prior myocardial infarction complicated by shock and prior stenting.  Underwent catheterization in 2010demonstrated ejection fraction of 35% and. Severe LAD stenosis secondary to acute thrombus.  wqith patent RCA stent  Has complaints  of midsternal chest discomfort occasionally associated with shortness of breath as well as complaints of shoulder and neck discomfort which his wife reminds us was his presenting pain with his MI. There has been impaired exertion but the chest discomfort has not necessarily been associated with it.     Past Medical History  Diagnosis Date  . Automatic implantable cardiac defibrillator in situ     medtronic; [facility]  . HTN (hypertension)   . HLD (hyperlipidemia) mixed  . Cardiomyopathy     ischemic  . CAD (coronary artery disease)   . Anxiety     Past Surgical History  Procedure Date  . Laproscopic cholecystectomy   . Defibrillator implantation 04/12/02    primary prevention for sudden death; Dr. Osiris Charles     Current Outpatient Prescriptions  Medication Sig Dispense Refill  . aspirin 325 MG EC tablet Take 325 mg by mouth daily.        . carvedilol (COREG) 12.5 MG tablet Take 1 tablet (12.5 mg total) by mouth 2 (two) times daily.  60 tablet  12  . furosemide (LASIX) 20 MG tablet Take 1 tablet (20 mg total) by mouth daily.  30 tablet  12  . lisinopril (PRINIVIL,ZESTRIL) 20 MG tablet Take 1 tablet (20 mg total) by mouth daily.  30 tablet  12  . nitroGLYCERIN (NITROSTAT) 0.4 MG SL tablet Place 0.4 mg under the tongue every 5 (five) minutes as needed.        . simvastatin (ZOCOR) 80 MG tablet Take 80 mg by mouth daily.          No Known Allergies  Review of Systems negative except from HPI and PMH  Physical Exam BP 140/78  Pulse 82  Ht 5' 11" (1.803 m)  Wt 242 lb 6.4 oz (109.952 kg)  BMI 33.81 kg/m2 Well developed and well nourished in no  acute distress HENT normal E scleral and icterus clear Neck Supple JVP7-8 cm carotids brisk and full Clear to ausculation Regular rate and rhythm, no murmurs gallops or rub Soft with active bowel sounds No clubbing cyanosis none Edema Alert and oriented, grossly normal motor and sensory function Skin Warm and Dry   ECG demonstrates sinus rhythm with Q waves in lead V1 and V2 which are old. Assessment and  Plan  

## 2011-06-01 NOTE — CV Procedure (Addendum)
   Cardiac Catheterization Procedure Note  Name: [name] MRN: [id number] DOB: [birth year]  Procedure: Left Heart Cath, Selective Coronary Angiography, LV angiography  Indication: 56 year old white male with history of coronary disease and multiple prior coronary interventions presents with new onset of angina. He is also had recent defibrillator discharges. He has a known ischemic cardiomyopathy.   Procedural details: The right groin was prepped, draped, and anesthetized with 1% lidocaine. Using modified Seldinger technique, a 4 French sheath was introduced into the right femoral artery. Standard Judkins catheters were used for coronary angiography and left ventriculography. Catheter exchanges were performed over a guidewire. There were no immediate procedural complications. The patient was transferred to the post catheterization recovery area for further monitoring.  Procedural Findings: Hemodynamics:  AO 91/49 with a mean of 66 mmHg LV 91/34 mmHg   Coronary angiography: Coronary dominance: right  Left mainstem: Normal.  Left anterior descending (LAD): There is a long stented segment in the proximal to mid LAD is patent. There is diffuse 20% narrowing within the stent. Distal to the stent in the mid LAD there is a 95% focal stenosis at the takeoff of the second diagonal branch.  Left circumflex (LCx): A left circumflex is a large vessel. There is a 50% stenosis in the proximal vessel. He first obtuse marginal vessel is very small in caliber and is occluded proximally. The second and third marginal branches are large vessels without significant disease.  Right coronary artery (RCA): The right coronary has an inferior take off. It is a codominant vessel. There is 40% narrowing at the ostium. A stent is noted in the mid right coronary and is widely patent with less than 10-20% irregularities. There is a 30% lesion in the distal right coronary.  Left ventriculography: Left ventricular  systolic function is abnormal. The left ventricle is large. There is mid inferior wall akinesis and apical dyskinesis. The mid to distal anterior wall is akinetic. Overall ejection fraction is estimated at 20-25%.  Final Conclusions:   1. Two-vessel obstructive atherosclerotic coronary disease. There is a de novo lesion in the mid LAD that is critical. The first obtuse marginal vessel is very small in caliber and it is occluded. Stents in the proximal to mid LAD and in the mid right coronary are still patent and 2. Severe left ventricular dysfunction.  Recommendations: Recommend proceeding with percutaneous intervention of the mid LAD stenosis. The patient will be admitted today for stenting of the mid LAD. Please note post PCI orders for admission orders.  Ovila Lepage [place] 06/01/2011, 10:24 AM

## 2011-06-01 NOTE — Consult Note (Signed)
Pt smokes 1 ppd plus and is eager to quit. He has quit with patches in the past but says smoked 1 cigarette a day with the patch. Recommended 21 mg patch to start with and use the 4 mg nicotine gum or lozenges in combination PRN. Discussed patch and gum/lozenge use instructions. Pt is in action stage. Referred to 1-800 quit now for f/u and support. Discussed oral fixation substitutes, second hand smoke and in home smoking policy. Reviewed and gave pt Written education/contact information.

## 2011-06-01 NOTE — H&P (View-Only) (Signed)
  HPI  [name] is a 56 y.o. male is seen in followup for ICD implanted as per the MASTER trial with prior myocardial infarction complicated by shock and prior stenting.  Underwent catheterization in 2010demonstrated ejection fraction of 35% and. Severe LAD stenosis secondary to acute thrombus.  wqith patent RCA stent  Has complaints  of midsternal chest discomfort occasionally associated with shortness of breath as well as complaints of shoulder and neck discomfort which his wife reminds [place] was his presenting pain with his MI. There has been impaired exertion but the chest discomfort has not necessarily been associated with it.     Past Medical History  Diagnosis Date  . Automatic implantable cardiac defibrillator in situ     medtronic; [facility]  . HTN (hypertension)   . HLD (hyperlipidemia) mixed  . Cardiomyopathy     ischemic  . CAD (coronary artery disease)   . Anxiety     Past Surgical History  Procedure Date  . Laproscopic cholecystectomy   . Defibrillator implantation 04/12/02    primary prevention for sudden death; Dr. Graciela Husbands     Current Outpatient Prescriptions  Medication Sig Dispense Refill  . aspirin 325 MG EC tablet Take 325 mg by mouth daily.        . carvedilol (COREG) 12.5 MG tablet Take 1 tablet (12.5 mg total) by mouth 2 (two) times daily.  60 tablet  12  . furosemide (LASIX) 20 MG tablet Take 1 tablet (20 mg total) by mouth daily.  30 tablet  12  . lisinopril (PRINIVIL,ZESTRIL) 20 MG tablet Take 1 tablet (20 mg total) by mouth daily.  30 tablet  12  . nitroGLYCERIN (NITROSTAT) 0.4 MG SL tablet Place 0.4 mg under the tongue every 5 (five) minutes as needed.        . simvastatin (ZOCOR) 80 MG tablet Take 80 mg by mouth daily.          No Known Allergies  Review of Systems negative except from HPI and PMH  Physical Exam BP 140/78  Pulse 82  Ht 5\' 11"  (1.803 m)  Wt 242 lb 6.4 oz (109.952 kg)  BMI 33.81 kg/m2 Well developed and well nourished in no  acute distress HENT normal E scleral and icterus clear Neck Supple JVP7-8 cm carotids brisk and full Clear to ausculation Regular rate and rhythm, no murmurs gallops or rub Soft with active bowel sounds No clubbing cyanosis none Edema Alert and oriented, grossly normal motor and sensory function Skin Warm and Dry   ECG demonstrates sinus rhythm with Q waves in lead V1 and V2 which are old. Assessment and  Plan

## 2011-06-01 NOTE — Interval H&P Note (Signed)
History and Physical Interval Note:  06/01/2011 10:44 AM  [name]  has presented today for surgery, with the diagnosis of blockage  The various methods of treatment have been discussed with the patient and family. After consideration of risks, benefits and other options for treatment, the patient has consented to  Procedure(s) (LRB): PERCUTANEOUS CORONARY STENT INTERVENTION (PCI-S) (N/A) as a surgical intervention .  The patients' history has been reviewed, patient examined, no change in status, stable for surgery.  I have reviewed the patients' chart and labs.  Questions were answered to the patient's satisfaction.     [name] [facility] 06/01/2011 10:44 AM

## 2011-06-01 NOTE — Progress Notes (Signed)
MD Turner notified at this time about patients BP in the 80's and maps 50-55. Patient upon assessment is asymptomatic, no signs or symptoms of distress. Vital signs stable, patient in NSR 60's 95 02 sat RA. New orders obtained at this time. Will continue to monitor the patient.

## 2011-06-01 NOTE — Interval H&P Note (Signed)
History and Physical Interval Note:  06/01/2011 9:45 AM  [name]  has presented today for surgery, with the diagnosis of VT  The various methods of treatment have been discussed with the patient and family. After consideration of risks, benefits and other options for treatment, the patient has consented to  Procedure(s) (LRB): JV LEFT HEART CATHETERIZATION WITH CORONARY ANGIOGRAM (N/A) as a surgical intervention .  The patients' history has been reviewed, patient examined, no change in status, stable for surgery.  I have reviewed the patients' chart and labs.  Questions were answered to the patient's satisfaction.     [name] [facility] 06/01/2011 9:45 AM

## 2011-06-01 NOTE — Progress Notes (Signed)
Transported to main cath lab for PCI with right femoral sheath intact via stretcher and monitor.  Belongings with patient.

## 2011-06-01 NOTE — CV Procedure (Signed)
   CARDIAC CATH NOTE  Name: [name] MRN: [id number] DOB: 1955-03-10  Procedure: PTCA and stenting of the mid LAD  Indication: 56 year old white male with history of coronary disease and multiple prior coronary interventions presents with new onset of angina and recent defibrillator discharge. Diagnostic study demonstrated a critical mid LAD stenosis at the diagonal bifurcation. This was distal to his prior stents.  Procedural Details: The right groin was prepped, draped, and anesthetized with 1% lidocaine.  A 6 Fr sheath was exchanged for the diagnostic sheath into the right femoral artery. Plavix 600 mg by mouth was given. Weight-based bivalirudin was given for anticoagulation. Once a therapeutic ACT was achieved, a 6 [place] XB LAD 4 guide catheter was inserted.  A pro-water coronary guidewire was used to cross the lesion.  We used a second pro-water wire to cross into the diagonal. The lesion at the origin of the diagonal was predilated with a 2.0 mm balloon.  The mid LAD lesion was then stented with a 3.0 x 16 mm Veri-flex stent.  The stent was postdilated with a 3.25 mm noncompliant balloon to 16 atmospheres.  Following PCI, there was 0% residual stenosis and TIMI-3 flow. The ostial diagonal was still severe up to 90% but this is unchanged from pre-intervention. There was still TIMI grade 3 flow. This is a relatively small diagonal branch and we elected not to rewire it and expanded it again. Final angiography confirmed an excellent result. The patient tolerated the procedure well. There were no immediate procedural complications. Femoral hemostasis was achieved with Angio-Seal device. The patient was transferred to the post catheterization recovery area for further monitoring.  Lesion Data: Vessel: Mid LAD Percent stenosis (pre): 95% TIMI-flow (pre):  3 Stent:  3.0 x 16 mm Veriflex Percent stenosis (post): 0% TIMI-flow (post): 3  Conclusions: Successful intracoronary stenting of the mid  LAD with a bare-metal stent.  Recommendations: Aspirin and Plavix preferably for one year.  [name] 06/01/2011, 11:41 AM

## 2011-06-01 NOTE — Interval H&P Note (Signed)
History and Physical Interval Note:  06/01/2011 10:41 AM  [name]  has presented today for surgery, with the diagnosis of VT. Diagnostic cardiac cath showed a critical stenosis in the mid LAD.   The various methods of treatment have been discussed with the patient and family. After consideration of risks, benefits and other options for treatment, the patient has consented to  Procedure(s) PCI of the LAD. The patients' history has been reviewed, patient examined, no change in status, stable for surgery.  I have reviewed the patients' chart and labs.  Questions were answered to the patient's satisfaction.     [name] [facility] 06/01/2011 10:42 AM

## 2011-06-02 ENCOUNTER — Telehealth: Payer: Self-pay | Admitting: Internal Medicine

## 2011-06-02 ENCOUNTER — Encounter (HOSPITAL_COMMUNITY): Payer: Self-pay | Admitting: Nurse Practitioner

## 2011-06-02 DIAGNOSIS — I2510 Atherosclerotic heart disease of native coronary artery without angina pectoris: Secondary | ICD-10-CM | POA: Insufficient documentation

## 2011-06-02 DIAGNOSIS — I200 Unstable angina: Secondary | ICD-10-CM

## 2011-06-02 LAB — BASIC METABOLIC PANEL
BUN: 17 mg/dL (ref 6–23)
Chloride: 105 mEq/L (ref 96–112)
Creatinine, Ser: 1.11 mg/dL (ref 0.50–1.35)
GFR calc Af Amer: 83 mL/min — ABNORMAL LOW (ref >90)
Glucose, Bld: 99 mg/dL (ref 70–99)
Potassium: 4.5 mEq/L (ref 3.5–5.1)

## 2011-06-02 LAB — CBC
HCT: 38 % — ABNORMAL LOW (ref 39.0–52.0)
Hemoglobin: 12.9 g/dL — ABNORMAL LOW (ref 13.0–17.0)
MCHC: 33.9 g/dL (ref 30.0–36.0)
MCV: 90.7 fL (ref 78.0–100.0)
RDW: 13.6 % (ref 11.5–15.5)
WBC: 5.2 10*3/uL (ref 4.0–10.5)

## 2011-06-02 MED ORDER — CLOPIDOGREL BISULFATE 75 MG PO TABS: 75.0000 mg | ORAL_TABLET | Freq: Every day | ORAL | Status: DC

## 2011-06-02 MED ORDER — ASPIRIN 81 MG PO TBEC: 81.0000 mg | DELAYED_RELEASE_TABLET | Freq: Every day | ORAL | Status: DC

## 2011-06-02 MED ORDER — ASPIRIN 81 MG PO TBEC: 325.0000 mg | DELAYED_RELEASE_TABLET | Freq: Every day | ORAL | Status: DC

## 2011-06-02 MED ORDER — CARVEDILOL 12.5 MG PO TABS: 18.7500 mg | ORAL_TABLET | Freq: Two times a day (BID) | ORAL | Status: DC

## 2011-06-02 MED FILL — Dextrose Inj 5%: INTRAVENOUS | Qty: 50 | Status: AC

## 2011-06-02 NOTE — Progress Notes (Signed)
  Patient Name: [name]      SUBJECTIVE: s/p cath for chest pain and new VT  LAD stented 95% mid lesion >>BMS EF20-25%  w/o chest pain or shortness of breath  Past Medical History  Diagnosis Date  . Automatic implantable cardiac defibrillator in situ     medtronic; [facility]  . HTN (hypertension)   . HLD (hyperlipidemia) mixed  . Ischemic cardiomyopathy     EF 30-35% 2010  . CAD (coronary artery disease)     . Severe LAD stenosis secondary to acute thrombus.   . Anxiety   . Paroxysmal ventricular tachycardia     VFlutter  CL 210 msec  Rx shock 2013    PHYSICAL EXAM Filed Vitals:   06/01/11 2200 06/01/11 2300 06/02/11 0024 06/02/11 0413  BP: 88/48 80/41 85/53  102/52  Pulse:   74 76  Temp:   97.7 F (36.5 C) 97.5 F (36.4 C)  TempSrc:   Oral Oral  Resp:   20 17  Weight:    245 lb 2.4 oz (111.2 kg)  SpO2:   97% 95%   Well developed and nourished in no acute distress HENT normal Neck supple with JVP-flat Clear Regular rate and rhythm, no murmurs or gallops Abd-soft with active BS No Clubbing cyanosis edema Skin-warm and dry A & Oriented  Grossly normal sensory and motor function   TELEMETRY: Reviewed telemetry pt in NSR    Intake/Output Summary (Last 24 hours) at 06/02/11 0842 Last data filed at 06/01/11 1700  Gross per 24 hour  Intake   1080 ml  Output    550 ml  Net    530 ml    LABS: Basic Metabolic Panel:  Lab 06/02/11 1610 05/29/11 1644  NA 137 138  K 4.5 4.8  CL 105 109  CO2 27 22  GLUCOSE 99 82  BUN 17 25*  CREATININE 1.11 1.22  CALCIUM 9.0 9.5  MG -- 2.0  PHOS -- --   Cardiac Enzymes: No results found for this basename: CKTOTAL:3,CKMB:3,CKMBINDEX:3,TROPONINI:3 in the last 72 hours CBC:   ASSESSMENT AND PLAN: S/p LAD PCI  VT ICM  home today on coreg 18.725 bid F/u SK 2-3 weeks No driving [id number]   Signed, Sherryl Manges MD  06/02/2011

## 2011-06-02 NOTE — Discharge Summary (Addendum)
Patient ID: [name],  MRN: [id number], DOB/AGE: [birth year] 56 y.o.  Admit date: 06/01/2011 Discharge date: 06/02/2011  Primary Cardiologist: Odessa Fleming, MD  Discharge Diagnoses Principal Problem:  *Unstable angina Active Problems:  HYPERLIPIDEMIA-MIXED  HYPERTENSION, UNSPECIFIED  CARDIOMYOPATHY, ISCHEMIC  VENTRICULAR TACHYCARDIA  TOBACCO ABUSE  Ischemic cardiomyopathy  CAD (coronary artery disease)  Allergies No Known Allergies  Procedures  Cardiac Catheterization and Percutaneous Coronary Intervention 06/01/2011  Procedural Findings: Hemodynamics:  AO 91/49 with a mean of 66 mmHg LV 91/34 mmHg              Coronary angiography: Coronary dominance: right  Left mainstem: Normal.  Left anterior descending (LAD): There is a long stented segment in the proximal to mid LAD is patent. There is diffuse 20% narrowing within the stent. Distal to the stent in the mid LAD there is a 95% focal stenosis at the takeoff of the second diagonal branch.   **The Diagonal was ballooned with a 2.24mm ballon.  The mid LAD was stented with a 3.0 x 16mm veri-flex BMS**  Left circumflex (LCx): A left circumflex is a large vessel. There is a 50% stenosis in the proximal vessel. He first obtuse marginal vessel is very small in caliber and is occluded proximally. The second and third marginal branches are large vessels without significant disease.  Right coronary artery (RCA): The right coronary has an inferior take off. It is a codominant vessel. There is 40% narrowing at the ostium. A stent is noted in the mid right coronary and is widely patent with less than 10-20% irregularities. There is a 30% lesion in the distal right coronary.  Left ventriculography: Left ventricular systolic function is abnormal. The left ventricle is large. There is mid inferior wall akinesis and apical dyskinesis. The mid to distal anterior wall is akinetic. Overall ejection fraction is estimated at  20-25%. __________  History of Present Illness  56 y/o male with the above complex problem list.  He was in his USOH until 3/29 when he was awakened suddenly by an ICD shock.  He called into the answering service and was having no chest pain or shortness of breath.  F/U was arranged with Dr. Graciela Husbands.  Pt saw Dr. Graciela Husbands on 4/5 and review of ICD interrogation revealed ventricular flutter.  Further, pt reported exertional c/p and dyspnea.  Decision was made to pursue diagnostic catheterization.  Pt was advised not to drive.  Hospital Course  Pt presented to the [facility] cath lab on 4/8 and underwent diagnostic catheterization revealing a 95% stenosis in the mid LAD.  This was subsequently stented with a 3.0 x 16mm Veri-flex BMS.  The second diagonal branch was also balloon angioplastied.  Pt tolerated procedure well and post-procedure has had no further chest pain.  He has been ambulating w/o difficulty and has been counseled on the importance of smoking cessation.  He will be discharged home today in good condition.    Discharge Vitals Blood pressure 102/52, pulse 76, temperature 97.5 F (36.4 C), temperature source Oral, resp. rate 17, weight 245 lb 2.4 oz (111.2 kg), SpO2 95.00%.  Filed Weights   06/02/11 0413  Weight: 245 lb 2.4 oz (111.2 kg)   Labs  CBC  Basename 06/02/11 0400 06/01/11 2305  WBC 5.2 5.2  NEUTROABS -- --  HGB 12.9* 12.4*  HCT 38.0* 36.6*  MCV 90.7 89.7  PLT 184 185   Basic Metabolic Panel  Basename 06/02/11 0400  NA 137  K 4.5  CL 105  CO2 27  GLUCOSE 99  BUN 17  CREATININE 1.11  CALCIUM 9.0  MG --  PHOS --   Disposition  Pt is being discharged home today in good condition.  Follow-up Plans & Appointments  Follow-up Information    Follow up with Sherryl Manges, MD on 06/17/2011. (2:30 PM)    Contact information:   [address] [place] [address] [phone]          Discharge Medications  Medication List  As  of 06/02/2011 12:36 PM   TAKE these medications         aspirin 81 MG EC tablet   Take 1 tablet (81 mg total) by mouth daily.      carvedilol 12.5 MG tablet   Commonly known as: COREG   Take 1.5 tablets (18.75 mg total) by mouth 2 (two) times daily with a meal.      clopidogrel 75 MG tablet   Commonly known as: PLAVIX   Take 1 tablet (75 mg total) by mouth daily with breakfast.      furosemide 20 MG tablet   Commonly known as: LASIX   Take 20 mg by mouth daily.      lisinopril 20 MG tablet   Commonly known as: PRINIVIL,ZESTRIL   Take 20 mg by mouth daily.      nitroGLYCERIN 0.4 MG SL tablet   Commonly known as: NITROSTAT   Place 0.4 mg under the tongue every 5 (five) minutes as needed. For chest pain      simvastatin 80 MG tablet   Commonly known as: ZOCOR   Take 80 mg by mouth at bedtime.           Outstanding Labs/Studies  None  Duration of Discharge Encounter   Greater than 30 minutes including physician time.  Signed, Nicolasa Ducking NP 06/02/2011, 12:36 PM

## 2011-06-02 NOTE — Telephone Encounter (Signed)
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 Per Dr. Graciela Husbands, he would prefer the patient stay out of work until his follow up appointment on 06/17/11. The patient is aware and agreeable.

## 2011-06-02 NOTE — Discharge Instructions (Signed)
***  PLEASE REMEMBER TO BRING ALL OF YOUR MEDICATIONS TO EACH OF YOUR FOLLOW-UP OFFICE VISITS.  Groin Site Care Refer to this sheet in the next few weeks. These instructions provide you with information on caring for yourself after your procedure. Your caregiver may also give you more specific instructions. Your treatment has been planned according to current medical practices, but problems sometimes occur. Call your caregiver if you have any problems or questions after your procedure. HOME CARE INSTRUCTIONS  You may shower 24 hours after the procedure. Remove the bandage (dressing) and gently wash the site with plain soap and water. Gently pat the site dry.   Do not apply powder or lotion to the site.   Do not sit in a bathtub, swimming pool, or whirlpool for 5 to 7 days.   No bending, squatting, or lifting anything over 10 pounds (4.5 kg) as directed by your caregiver.   Inspect the site at least twice daily.   No DRIVING FOR 3-6 MONTHS (related to ventricular arrhythmias and recent ICD shock). What to expect:  Any bruising will usually fade within 1 to 2 weeks.   Blood that collects in the tissue (hematoma) may be painful to the touch. It should usually decrease in size and tenderness within 1 to 2 weeks.  SEEK IMMEDIATE MEDICAL CARE IF:  You have unusual pain at the groin site or down the affected leg.   You have redness, warmth, swelling, or pain at the groin site.   You have drainage (other than a small amount of blood on the dressing).   You have chills.   You have a fever or persistent symptoms for more than 72 hours.   You have a fever and your symptoms suddenly get worse.   Your leg becomes pale, cool, tingly, or numb.  You have heavy bleeding from the site. Hold pressure on the site. [name]

## 2011-06-02 NOTE — Progress Notes (Signed)
CARDIAC REHAB PHASE I   PRE:  Rate/Rhythm: 73 SR  BP:  Supine: 115/71  Sitting:   Standing:    SaO2:   MODE:  Ambulation: 680 ft   POST:  Rate/Rhythem: 84 SR  BP:  Supine:   Sitting: 125/61  Standing:    SaO2:  0800-0900 Tolerated ambulation well without c/o of cp or SOB. VS stable. To recliner after walk with call light in reach. Completed discharge education with pt and wife. He declines Outpt. CRP due to work hours.  Beatrix Fetters

## 2011-06-02 NOTE — Telephone Encounter (Signed)
Will forward to Dr. Klein for review. 

## 2011-06-02 NOTE — Telephone Encounter (Signed)
New msg Pt called and said he just got released from hospital and wanted to know when he can go back to work.

## 2011-06-04 NOTE — Progress Notes (Signed)
Addended by: Reine Just on: 06/04/2011 10:29 AM   Modules accepted: Orders

## 2011-06-05 ENCOUNTER — Telehealth: Payer: Self-pay | Admitting: Internal Medicine

## 2011-06-05 DIAGNOSIS — I2510 Atherosclerotic heart disease of native coronary artery without angina pectoris: Secondary | ICD-10-CM

## 2011-06-05 NOTE — Telephone Encounter (Signed)
I spoke with the patient. He is on carvedilol 12.5 mg 1 & 1/2 twice daily. He reports a BP of 88/50 this morning ( he did not record a heart rate) and this afternoon he was 100/50. His HR now is 69 bpm. Per the patient, today is the first day he has been extremely lightheaded. This has resolved as the day has gone on. I explained to the patient we may need to decrease his carvedilol a little. I have instructed him to check his BP tonight when his next dose of coreg is due. If his SBP is < 100, he will hold his coreg tonight. In the morning, I have instructed him to start 12.5 mg one tablet BID and to record his BP & HR readings. He will call us back on Monday if his readings are still low and he does not feel well. He voices understanding and is agreeable.

## 2011-06-05 NOTE — Telephone Encounter (Signed)
New msg Pt's wife wanted to talk to you about coreg. BP 88/50 Please call

## 2011-06-07 ENCOUNTER — Telehealth: Payer: Self-pay | Admitting: Physician Assistant

## 2011-06-07 ENCOUNTER — Inpatient Hospital Stay (HOSPITAL_COMMUNITY)
Admission: EM | Admit: 2011-06-07 | Discharge: 2011-06-08 | DRG: 287 | Disposition: A | Discharge status: Home / Self Care | Payer: Self-pay | Source: Ambulatory Visit | Attending: Internal Medicine | Admitting: Internal Medicine

## 2011-06-07 ENCOUNTER — Emergency Department (HOSPITAL_COMMUNITY): Payer: Self-pay

## 2011-06-07 ENCOUNTER — Encounter (HOSPITAL_COMMUNITY): Payer: Self-pay | Admitting: *Deleted

## 2011-06-07 DIAGNOSIS — I472 Ventricular tachycardia, unspecified: Secondary | ICD-10-CM | POA: Diagnosis present

## 2011-06-07 DIAGNOSIS — I5022 Chronic systolic (congestive) heart failure: Secondary | ICD-10-CM | POA: Diagnosis present

## 2011-06-07 DIAGNOSIS — I509 Heart failure, unspecified: Secondary | ICD-10-CM | POA: Diagnosis present

## 2011-06-07 DIAGNOSIS — I2510 Atherosclerotic heart disease of native coronary artery without angina pectoris: Secondary | ICD-10-CM | POA: Diagnosis present

## 2011-06-07 DIAGNOSIS — I10 Essential (primary) hypertension: Secondary | ICD-10-CM | POA: Diagnosis present

## 2011-06-07 DIAGNOSIS — I255 Ischemic cardiomyopathy: Secondary | ICD-10-CM | POA: Insufficient documentation

## 2011-06-07 DIAGNOSIS — I4729 Other ventricular tachycardia: Secondary | ICD-10-CM | POA: Diagnosis present

## 2011-06-07 DIAGNOSIS — Z95810 Presence of automatic (implantable) cardiac defibrillator: Secondary | ICD-10-CM

## 2011-06-07 DIAGNOSIS — I2589 Other forms of chronic ischemic heart disease: Secondary | ICD-10-CM | POA: Diagnosis present

## 2011-06-07 DIAGNOSIS — R079 Chest pain, unspecified: Principal | ICD-10-CM | POA: Diagnosis present

## 2011-06-07 DIAGNOSIS — F17200 Nicotine dependence, unspecified, uncomplicated: Secondary | ICD-10-CM | POA: Diagnosis present

## 2011-06-07 DIAGNOSIS — I252 Old myocardial infarction: Secondary | ICD-10-CM

## 2011-06-07 DIAGNOSIS — I200 Unstable angina: Secondary | ICD-10-CM

## 2011-06-07 DIAGNOSIS — R1310 Dysphagia, unspecified: Secondary | ICD-10-CM | POA: Diagnosis present

## 2011-06-07 DIAGNOSIS — E785 Hyperlipidemia, unspecified: Secondary | ICD-10-CM | POA: Diagnosis present

## 2011-06-07 LAB — DIFFERENTIAL
Eosinophils Absolute: 0.3 10*3/uL (ref 0.0–0.7)
Eosinophils Relative: 5 % (ref 0–5)
Lymphocytes Relative: 29 % (ref 12–46)
Lymphs Abs: 1.6 10*3/uL (ref 0.7–4.0)
Monocytes Absolute: 0.5 10*3/uL (ref 0.1–1.0)
Monocytes Relative: 9 % (ref 3–12)

## 2011-06-07 LAB — CBC
HCT: 39.1 % (ref 39.0–52.0)
Hemoglobin: 13.6 g/dL (ref 13.0–17.0)
MCH: 30.9 pg (ref 26.0–34.0)
MCV: 88.9 fL (ref 78.0–100.0)
RBC: 4.4 MIL/uL (ref 4.22–5.81)

## 2011-06-07 LAB — BASIC METABOLIC PANEL
BUN: 16 mg/dL (ref 6–23)
CO2: 24 mEq/L (ref 19–32)
Calcium: 9.2 mg/dL (ref 8.4–10.5)
GFR calc non Af Amer: >90 mL/min (ref >90)
Glucose, Bld: 93 mg/dL (ref 70–99)

## 2011-06-07 LAB — PROTIME-INR: Prothrombin Time: 13.3 seconds (ref 11.6–15.2)

## 2011-06-07 LAB — CARDIAC PANEL(CRET KIN+CKTOT+MB+TROPI)
CK, MB: 2 ng/mL (ref 0.3–4.0)
Total CK: 94 U/L (ref 7–232)

## 2011-06-07 MED ORDER — CLOPIDOGREL BISULFATE 75 MG PO TABS
75.0000 mg | ORAL_TABLET | Freq: Every day | ORAL | Status: DC
  Administered 2011-06-07 – 2011-06-08 (×2): 75 mg via ORAL
  Filled 2011-06-07 (×2): qty 1

## 2011-06-07 MED ORDER — HEPARIN BOLUS VIA INFUSION
4000.0000 [IU] | Freq: Once | INTRAVENOUS | Status: AC
  Administered 2011-06-07: 4000 [IU] via INTRAVENOUS
  Filled 2011-06-07: qty 4000

## 2011-06-07 MED ORDER — CLOPIDOGREL BISULFATE 75 MG PO TABS: 75.0000 mg | ORAL_TABLET | Freq: Every day | ORAL | Status: DC

## 2011-06-07 MED ORDER — SODIUM CHLORIDE 0.9 % IJ SOLN: 3.0000 mL | Freq: Two times a day (BID) | INTRAMUSCULAR | Status: DC

## 2011-06-07 MED ORDER — SODIUM CHLORIDE 0.9 % IV SOLN
1000.0000 mL | INTRAVENOUS | Status: DC
  Administered 2011-06-07: 1000 mL via INTRAVENOUS

## 2011-06-07 MED ORDER — NITROGLYCERIN 0.4 MG SL SUBL
SUBLINGUAL_TABLET | SUBLINGUAL | Status: AC
  Filled 2011-06-07: qty 25

## 2011-06-07 MED ORDER — NITROGLYCERIN 0.4 MG SL SUBL: 0.4000 mg | SUBLINGUAL_TABLET | SUBLINGUAL | Status: DC | PRN

## 2011-06-07 MED ORDER — SODIUM CHLORIDE 0.9 % IV SOLN
1.0000 mL/kg/h | INTRAVENOUS | Status: DC
  Administered 2011-06-08 (×2): 1 mL/kg/h via INTRAVENOUS

## 2011-06-07 MED ORDER — ONDANSETRON HCL 4 MG/2ML IJ SOLN: 4.0000 mg | Freq: Four times a day (QID) | INTRAMUSCULAR | Status: DC | PRN

## 2011-06-07 MED ORDER — ALPRAZOLAM 0.5 MG PO TABS
0.5000 mg | ORAL_TABLET | Freq: Every evening | ORAL | Status: AC | PRN
  Administered 2011-06-07: 0.5 mg via ORAL
  Filled 2011-06-07: qty 1

## 2011-06-07 MED ORDER — ACETAMINOPHEN 325 MG PO TABS
650.0000 mg | ORAL_TABLET | ORAL | Status: DC | PRN
  Administered 2011-06-08 (×2): 650 mg via ORAL
  Filled 2011-06-07 (×2): qty 2

## 2011-06-07 MED ORDER — SODIUM CHLORIDE 0.9 % IV SOLN: 250.0000 mL | INTRAVENOUS | Status: DC | PRN

## 2011-06-07 MED ORDER — LISINOPRIL 20 MG PO TABS
20.0000 mg | ORAL_TABLET | Freq: Every day | ORAL | Status: DC
  Administered 2011-06-07 – 2011-06-08 (×2): 20 mg via ORAL
  Filled 2011-06-07 (×2): qty 1

## 2011-06-07 MED ORDER — ATORVASTATIN CALCIUM 80 MG PO TABS
80.0000 mg | ORAL_TABLET | Freq: Every day | ORAL | Status: DC
  Administered 2011-06-07: 80 mg via ORAL
  Filled 2011-06-07 (×2): qty 1

## 2011-06-07 MED ORDER — SODIUM CHLORIDE 0.9 % IJ SOLN: 3.0000 mL | INTRAMUSCULAR | Status: DC | PRN

## 2011-06-07 MED ORDER — CARVEDILOL 25 MG PO TABS
25.0000 mg | ORAL_TABLET | Freq: Two times a day (BID) | ORAL | Status: DC
  Administered 2011-06-08: 25 mg via ORAL
  Filled 2011-06-07 (×3): qty 1

## 2011-06-07 MED ORDER — HEPARIN (PORCINE) IN NACL 100-0.45 UNIT/ML-% IJ SOLN
1400.0000 [IU]/h | INTRAMUSCULAR | Status: DC
  Administered 2011-06-07 – 2011-06-08 (×2): 1400 [IU]/h via INTRAVENOUS
  Filled 2011-06-07 (×3): qty 250

## 2011-06-07 MED ORDER — ASPIRIN EC 81 MG PO TBEC
81.0000 mg | DELAYED_RELEASE_TABLET | Freq: Every day | ORAL | Status: DC
  Administered 2011-06-08: 81 mg via ORAL
  Filled 2011-06-07: qty 1

## 2011-06-07 MED ORDER — ASPIRIN EC 81 MG PO TBEC: 81.0000 mg | DELAYED_RELEASE_TABLET | Freq: Every day | ORAL | Status: DC

## 2011-06-07 MED ORDER — HYDROMORPHONE HCL PF 1 MG/ML IJ SOLN
1.0000 mg | Freq: Once | INTRAMUSCULAR | Status: AC
  Administered 2011-06-07: 1 mg via INTRAVENOUS
  Filled 2011-06-07: qty 1

## 2011-06-07 MED ORDER — ASPIRIN 81 MG PO CHEW
324.0000 mg | CHEWABLE_TABLET | Freq: Once | ORAL | Status: AC
  Administered 2011-06-07: 324 mg via ORAL
  Filled 2011-06-07: qty 4

## 2011-06-07 NOTE — ED Notes (Signed)
Had 1 stent placed placed Monday. Started having midsternal CP Thursday, reports has been constant, never has gone away completely. Pain non radiating, denies SOB, n/v, diaphoresis. States nothing makes pain worse

## 2011-06-07 NOTE — ED Provider Notes (Signed)
History     CSN: [id number]  Arrival date & time 06/07/11  1215   First MD Initiated Contact with Patient 06/07/11 1232      Chief Complaint  Patient presents with  . Chest Pain    (Consider location/radiation/quality/duration/timing/severity/associated sxs/prior treatment) Patient is a 56 y.o. male presenting with chest pain. The history is provided by the patient and medical records.  Chest Pain The chest pain began 3 - 5 days ago. Duration of episode(s) is 5 days. Chest pain occurs constantly. The chest pain is improving. Associated with: nothing in particular. At its most intense, the pain is at 4/10. The pain is currently at 2/10. The severity of the pain is mild. The quality of the pain is described as dull, pressure-like and similar to previous episodes ("like my heart attack"). The pain does not radiate (Retrosternal). Exacerbated by: Nothing. Pertinent negatives for primary symptoms include no fever, no fatigue, no syncope, no shortness of breath, no cough, no wheezing, no palpitations, no abdominal pain, no nausea, no vomiting, no dizziness and no altered mental status.  Pertinent negatives for associated symptoms include no claudication, no diaphoresis, no lower extremity edema, no near-syncope, no numbness, no orthopnea, no paroxysmal nocturnal dyspnea and no weakness. He tried nitroglycerin and aspirin (Improvement with nitroglycerin) for the symptoms. Risk factors: Hypertension, hyperlipidemia, ischemic cardiomyopathy, known coronary artery disease, stenting of coronary artery 7 days ago, paroxysmal V.tach, myocardial infarction. Past medical history comments: See above  Procedure history is positive for cardiac catheterization and echocardiogram.     Past Medical History  Diagnosis Date  . Automatic implantable cardiac defibrillator in situ     a. s/p MDT [facility]  . HTN (hypertension)   . HLD (hyperlipidemia) mixed  . Ischemic cardiomyopathy     a.  EF 30-35% 2010;  b.   EF 20-25% by LV gram 05/2011  . CAD (coronary artery disease)     a.  Severe LAD stenosis secondary to acute thrombus - BMS 2009;  b. 06/01/11 Cath - LM nl, LAD 20 isr, 28m, LCX 50p, OM1 100p, RCA 40ost, patent mid-stent, 30d, EF 20-25% - The LAD was treated with a 3.0x14mm Veri-flex BMS  . Anxiety   . Paroxysmal ventricular tachycardia     a. VFlutter  CL 210 msec  Rx shock 04/2011  . Systolic CHF, chronic   . Myocardial infarction   . Angina   . ICD (implantable cardiac defibrillator) in place   . CHF (congestive heart failure)   . Shortness of breath     Past Surgical History  Procedure Date  . Laproscopic cholecystectomy   . Defibrillator implantation 04/12/02    primary prevention for sudden death; Dr. Graciela Husbands   . Cardiac stent  lad 06/01/2011  . Cardiac catheterization   . Cardiac defibrillator placement     Family History  Problem Relation Age of Onset  . Cancer      family hx    History  Substance Use Topics  . Smoking status: Current Everyday Smoker -- 1.0 packs/day for 40 years    Types: Cigarettes  . Smokeless tobacco: Former Neurosurgeon  . Alcohol Use: Yes     occas      Review of Systems  Constitutional: Negative for fever, chills, diaphoresis, activity change, appetite change and fatigue.  HENT: Negative for ear pain, congestion, sore throat, rhinorrhea, neck pain, postnasal drip and sinus pressure.   Eyes: Negative for visual disturbance.  Respiratory: Negative for cough, chest tightness, shortness of breath  and wheezing.   Cardiovascular: Positive for chest pain. Negative for palpitations, orthopnea, claudication, leg swelling, syncope and near-syncope.  Gastrointestinal: Negative for nausea, vomiting, abdominal pain and diarrhea.  Genitourinary: Negative.   Musculoskeletal: Negative for myalgias and back pain.  Skin: Negative for color change, pallor and rash.  Neurological: Negative for dizziness, tremors, syncope, weakness, light-headedness, numbness and  headaches.  Hematological: Does not bruise/bleed easily.  Psychiatric/Behavioral: Negative for confusion and altered mental status.    Allergies  Review of patient's allergies indicates no known allergies.  Home Medications   Current Outpatient Rx  Name Route Sig Dispense Refill  . ASPIRIN 325 MG PO TBEC Oral Take 325 mg by mouth once.    . ASPIRIN EC 81 MG PO TBEC Oral Take 81 mg by mouth daily.    [name] CARVEDILOL 12.5 MG PO TABS Oral Take 12.5 mg by mouth 2 (two) times daily with a meal. Take one tablet by mouth twice daily    . CLOPIDOGREL BISULFATE 75 MG PO TABS Oral Take 75 mg by mouth daily with breakfast.    . FUROSEMIDE 20 MG PO TABS Oral Take 20 mg by mouth daily.    [name] LISINOPRIL 20 MG PO TABS Oral Take 20 mg by mouth daily.    [name] NICOTINE 21 MG/24HR TD PT24 Transdermal Place 1 patch onto the skin daily.    [name] NITROGLYCERIN 0.4 MG SL SUBL Sublingual Place 0.4 mg under the tongue every 5 (five) minutes as needed. For chest pain    . SIMVASTATIN 80 MG PO TABS Oral Take 80 mg by mouth at bedtime.      BP 109/55  Pulse 62  Temp(Src) 98.4 F (36.9 C) (Oral)  Resp 10  Ht 5\' 11"  (1.803 m)  Wt 244 lb (110.678 kg)  BMI 34.03 kg/m2  SpO2 100%  Physical Exam  Nursing note and vitals reviewed. Constitutional: He is oriented to person, place, and time. He appears well-developed and well-nourished. No distress.  HENT:  Head: Normocephalic and atraumatic.  Mouth/Throat: Oropharynx is clear and moist.  Eyes: EOM are normal. Pupils are equal, round, and reactive to light.  Neck: Normal range of motion. Neck supple. No JVD present. No tracheal deviation present.  Cardiovascular: Normal rate, regular rhythm, S1 normal, S2 normal, normal heart sounds and intact distal pulses.   No extrasystoles are present. PMI is not displaced.  Exam reveals no gallop and no friction rub.   No murmur heard. Pulmonary/Chest: Effort normal and breath sounds normal. No accessory muscle usage or stridor. Not  tachypneic. No respiratory distress. He has no decreased breath sounds. He has no wheezes. He has no rhonchi. He has no rales. He exhibits no tenderness, no bony tenderness, no crepitus and no retraction.  Abdominal: Soft. Bowel sounds are normal. He exhibits no distension and no mass. There is no tenderness. There is no rebound and no guarding.  Musculoskeletal: Normal range of motion. He exhibits no edema and no tenderness.  Neurological: He is alert and oriented to person, place, and time. No cranial nerve deficit. He exhibits normal muscle tone.  Skin: Skin is warm and dry. No rash noted. He is not diaphoretic. No erythema. No pallor.  Psychiatric: He has a normal mood and affect. His behavior is normal. Judgment and thought content normal.    ED Course  Procedures (including critical care time)   Date: 06/07/2011  Rate: 72  Rhythm: normal sinus rhythm  QRS Axis: left  Intervals: QRS borderline prolonged  ST/T  Wave abnormalities: nonspecific ST/T changes, ST depressions inferiorly, ST depressions anteriorly and ST depressions laterally  Conduction Disutrbances:Incomplete left bundle branch block  Narrative Interpretation:   Old EKG Reviewed: unchanged    Labs Reviewed  BASIC METABOLIC PANEL - Abnormal; Notable for the following:    Sodium 134 (*)    All other components within normal limits  PROTIME-INR  APTT  CBC  DIFFERENTIAL  CARDIAC PANEL(CRET KIN+CKTOT+MB+TROPI)   Dg Chest 2 View  06/07/2011  *RADIOLOGY REPORT*  Clinical Data: Chest pain  CHEST - 2 VIEW  Comparison: Chest radiograph 02/24/2008  Findings: Left-sided pacemaker overlies normal cardiac silhouette. No effusion, infiltrate, or pneumothorax.  Prior cholecystectomy. Degenerative osteophytosis of the thoracic spine.  Nodular apical thickening is likely benign apical thickening.  IMPRESSION: No acute cardiopulmonary process.  Original Report Authenticated By: Genevive Bi, M.D.     1. Coronary atherosclerosis  of native coronary artery   2. CAD (coronary artery disease)   3. Ischemic cardiomyopathy   4. Tobacco use disorder   5. Unstable angina       MDM  The patient's symptoms are concerning for myocardial infarction, unstable angina from coronary artery disease, acute coronary syndrome specifically reocclusion of the recently stented coronary artery. However, the patient appears to have no significant cardiac enzyme abnormality after 5 days of symptoms, constant.  At this time it's apparent that the patient has not had a myocardial infarction but unstable angina and acute coronary syndrome are still considerations. The patient will receive consultation from the [place] cardiology group in the emergency department to determine his disposition and further management of this symptom.        Felisa Bonier, MD 06/07/11 [phone]

## 2011-06-07 NOTE — ED Notes (Addendum)
C/o midsternal CP x 4 days. Had stent placed last Monday. Denies SOB, n/v, diaphoresis. Pt took ASA 325mg , NTG X1 at home. EMS gave NTG x 2 with relief.

## 2011-06-07 NOTE — Telephone Encounter (Signed)
[name] is a 56 y.o. male who has an ischemic cardiomyopathy, s/p ICD who underwent BMS to the LAD last week for unstable angina after suffering a shock from his ICD for VFlutter.  He was doing well after d/c until 4 days ago.  He began to have chest tightness.  It has been constant.  It is the same symptom he had previously with his angina.  He denies syncope, ICD Rx, dyspnea.  He has not taken a NTG.  I have advised him to take a NTG now.  He lives in [place] and will call 911 to be transported to the [facility] ED now. Tereso Newcomer, PA-C  11:14 AM 06/07/2011

## 2011-06-07 NOTE — Progress Notes (Signed)
ANTICOAGULATION CONSULT NOTE - Initial Consult  Pharmacy Consult for heparin Indication: chest pain/ACS  No Known Allergies  Patient Measurements: Height: 5\' 11"  (180.3 cm) Weight: 244 lb (110.678 kg) IBW/kg (Calculated) : 75.3  Heparin Dosing Weight: 98kg  Vital Signs: Temp: 98.1 F (36.7 C) (04/14 1853) Temp src: Oral (04/14 1226) BP: 124/67 mmHg (04/14 1853) Pulse Rate: 72  (04/14 1853)  Labs:  Basename 06/07/11 1323 06/07/11 1322  HGB -- 13.6  HCT -- 39.1  PLT -- 210  APTT -- 31  LABPROT -- 13.3  INR -- 0.99  HEPARINUNFRC -- --  CREATININE -- 0.95  CKTOTAL 107 --  CKMB 2.1 --  TROPONINI <0.30 --   Estimated Creatinine Clearance: 107.3 ml/min (by C-G formula based on Cr of 0.95).  Medical History: Past Medical History  Diagnosis Date  . Automatic implantable cardiac defibrillator in situ     a. s/p MDT [facility]  . HTN (hypertension)   . HLD (hyperlipidemia) mixed  . Ischemic cardiomyopathy     a.  EF 30-35% 2010;  b.  EF 20-25% by LV gram 05/2011  . CAD (coronary artery disease)     a.  Severe LAD stenosis secondary to acute thrombus - BMS 2009;  b. 06/01/11 Cath - LM nl, LAD 20 isr, 55m, LCX 50p, OM1 100p, RCA 40ost, patent mid-stent, 30d, EF 20-25% - The LAD was treated with a 3.0x17mm Veri-flex BMS  . Anxiety   . Paroxysmal ventricular tachycardia     a. VFlutter  CL 210 msec  Rx shock 04/2011  . Systolic CHF, chronic   . Myocardial infarction   . Angina   . ICD (implantable cardiac defibrillator) in place   . CHF (congestive heart failure)   . Shortness of breath     Medications:  Prescriptions prior to admission  Medication Sig Dispense Refill  . aspirin 325 MG EC tablet Take 325 mg by mouth once.      [name] aspirin EC 81 MG tablet Take 81 mg by mouth daily.      . carvedilol (COREG) 12.5 MG tablet Take 12.5 mg by mouth 2 (two) times daily with a meal. Take one tablet by mouth twice daily      . clopidogrel (PLAVIX) 75 MG tablet Take 75 mg by mouth  daily with breakfast.      . furosemide (LASIX) 20 MG tablet Take 20 mg by mouth daily.      [name] lisinopril (PRINIVIL,ZESTRIL) 20 MG tablet Take 20 mg by mouth daily.      . nicotine (NICODERM CQ - DOSED IN MG/24 HOURS) 21 mg/24hr patch Place 1 patch onto the skin daily.      . nitroGLYCERIN (NITROSTAT) 0.4 MG SL tablet Place 0.4 mg under the tongue every 5 (five) minutes as needed. For chest pain      . simvastatin (ZOCOR) 80 MG tablet Take 80 mg by mouth at bedtime.        Assessment: 56 yo male with CP to start heparin. Noted patient likely for cath in am.  Goal of Therapy:  Heparin level 0.3-0.7 units/ml   Plan:  -Heparin 400 unit bolus followed by 1400 units/hr (~14 units/kg/hr). -Heparin level in 6 hrs and daily with CBC daily  Benny Lennert 06/07/2011,7:45 PM

## 2011-06-07 NOTE — H&P (Signed)
CARDIOLOGY CONSULT NOTE  Primary Cardiologist:  Dr Jens Som Primary EP:  Dr Graciela Husbands  Admit Date: 06/07/2011  Reason for consultation:  Chest pain  [name] is a 56 y.o. male with a h/o CAD s/p recent PCI of the LAD 06/01/11 with BMS who now presents with recurrent chest pain.  He was admitted 1 week ago with ventricular flutter requiring ICD shock.  He reports mild substernal chest pain afterwards.  He had cath by Dr [place] 06/01/11 which revealed severe LAD stenosis for which he had a BMS placed.  He did well and was discharged.  He reports compliance with his antiplatelet regimen post discharge.  Around Wednesday, he began having recurrent chest pressure.  He describes this discomfort as "steady" and rates it 4/10.  He reports that this occurred with rest and was not worse with exertion.  He denies associated SOB, arm/jaw pain, N/V, palpitations, ICD shocks, or other.  He did not take slNTG until this am.  Today, he has taken 3 sl NTG and his pain improved to 2/10.  Upon arrival to [facility], he received dilaudid with resolution of his pain.  Presently, he is chest pain free and comfortable.   He reports feeling at times as if "food gets stuck" when swallowing.  This has been a chronic problem.  He does not think that his present chest pain is gerd and states that it is similar to his prior MI. The patient is tolerating medications without difficulties and is otherwise without complaint today.   Past Medical History  Diagnosis Date  . Automatic implantable cardiac defibrillator in situ     a. s/p MDT [facility]  . HTN (hypertension)   . HLD (hyperlipidemia) mixed  . Ischemic cardiomyopathy     a.  EF 30-35% 2010;  b.  EF 20-25% by LV gram 05/2011  . CAD (coronary artery disease)     a.  Severe LAD stenosis secondary to acute thrombus - BMS 2009;  b. 06/01/11 Cath - LM nl, LAD 20 isr, 43m, LCX 50p, OM1 100p, RCA 40ost, patent mid-stent, 30d, EF 20-25% - The LAD was treated with a 3.0x22mm Veri-flex  BMS  . Anxiety   . Paroxysmal ventricular tachycardia     a. VFlutter  CL 210 msec  Rx shock 04/2011  . Systolic CHF, chronic   . Myocardial infarction   . Angina   . ICD (implantable cardiac defibrillator) in place   . CHF (congestive heart failure)   . Shortness of breath    Past Surgical History  Procedure Date  . Laproscopic cholecystectomy   . Defibrillator implantation 04/12/02    primary prevention for sudden death; Dr. Graciela Husbands   . Cardiac stent  lad 06/01/2011  . Cardiac catheterization   . Cardiac defibrillator placement        . aspirin  324 mg Oral Once  .  HYDROmorphone (DILAUDID) injection  1 mg Intravenous Once      . sodium chloride 1,000 mL (06/07/11 1334)    No Known Allergies  History   Social History  . Marital Status: Married    Spouse Name: N/A    Number of Children: N/A  . Years of Education: N/A   Occupational History  . Not on file.   Social History Main Topics  . Smoking status: Current Everyday Smoker -- 1.0 packs/day for 40 years    Types: Cigarettes  . Smokeless tobacco: Former Neurosurgeon  . Alcohol Use: Yes     occas  .  Drug Use: No  . Sexually Active: Yes   Other Topics Concern  . Not on file   Social History Narrative   Married; full time.     Family History  Problem Relation Age of Onset  . Cancer      family hx    ROS- All systems are reviewed and negative except as per the HPI above  Physical Exam: Telemetry: Filed Vitals:   06/07/11 1226 06/07/11 1323 06/07/11 1424 06/07/11 1458  BP: 123/61 110/64 118/69 109/55  Pulse: 70 67 63 62  Temp: 98.4 F (36.9 C)     TempSrc: Oral     Resp: 16 15 16 10   Height:      Weight:      SpO2: 100% 100% 100% 100%    GEN- The patient is well appearing, alert and oriented x 3 today.   Head- normocephalic, atraumatic Eyes-  Sclera clear, conjunctiva pink Ears- hearing intact Oropharynx- clear Neck- supple, no JVP Lymph- no cervical lymphadenopathy Lungs- Clear to ausculation  bilaterally, normal work of breathing Heart- Regular rate and rhythm, no murmurs, rubs or gallops, PMI not laterally displaced GI- soft, NT, ND, + BS Extremities- no clubbing, cyanosis, or edema MS- no significant deformity or atrophy Skin- no rash or lesion Psych- euthymic mood, full affect Neuro- strength and sensation are intact  EKG today reveals sinus rhythm, PR 180, septal infarction with precordial TW inversions similar to 06/02/11.  No significant ekg changes  Labs:   Lab Results  Component Value Date   WBC 5.4 06/07/2011   HGB 13.6 06/07/2011   HCT 39.1 06/07/2011   MCV 88.9 06/07/2011   PLT 210 06/07/2011    Lab 06/07/11 1322  NA 134*  K 4.2  CL 103  CO2 24  BUN 16  CREATININE 0.95  CALCIUM 9.2  PROT --  BILITOT --  ALKPHOS --  ALT --  AST --  GLUCOSE 93   Lab Results  Component Value Date   CKTOTAL 107 06/07/2011   CKMB 2.1 06/07/2011   TROPONINI <0.30 06/07/2011    Lab Results  Component Value Date   CHOL 243* 02/04/2010   CHOL  Value: 159        ATP III CLASSIFICATION:  <200     mg/dL   Desirable  782-956  mg/dL   Borderline High  >=213    mg/dL   High        0/09/6576   CHOL 164 06/14/2007   Lab Results  Component Value Date   HDL 42.30 02/04/2010   HDL 41 02/25/2008   HDL 40.8 06/14/2007   Lab Results  Component Value Date   LDLCALC  Value: 105        Total Cholesterol/HDL:CHD Risk Coronary Heart Disease Risk Table                     Men   Women  1/2 Average Risk   3.4   3.3  Average Risk       5.0   4.4  2 X Average Risk   9.6   7.1  3 X Average Risk  23.4   11.0        Use the calculated Patient Ratio above and the CHD Risk Table to determine the patient's CHD Risk.        ATP III CLASSIFICATION (LDL):  <100     mg/dL   Optimal  469-629  mg/dL   Near or Above  Optimal  130-159  mg/dL   Borderline  161-096  mg/dL   High  >045     mg/dL   Very High* 4/0/9811   LDLCALC 92 06/14/2007   LDLCALC  Value: 126        Total Cholesterol/HDL:CHD Risk  Coronary Heart Disease Risk Table                     Men   Women  1/2 Average Risk   3.4   3.3* 05/01/2007   Lab Results  Component Value Date   TRIG 212.0* 02/04/2010   TRIG 67 02/25/2008   TRIG 158* 06/14/2007   Lab Results  Component Value Date   CHOLHDL 6 02/04/2010   CHOLHDL 3.9 02/25/2008   CHOLHDL 4.0 CALC 06/14/2007   Lab Results  Component Value Date   LDLDIRECT 159.2 02/04/2010   LDLDIRECT 137.1 06/21/2006      ASSESSMENT AND PLAN:   1.  CAD- pt with recent PCI 06/01/11 to LAD now presents with recurrent chest pain similar to prior angina.  This is worrisome for unstable angina/ ongoing ischemia.  Given negative CMs and no profound EKG changes, acute stent thrombosis seems unlikely, however I remain concerned.  Presently, he is chest pain free.  We will admit to [facility] for close observation on telemetry.  I think that given recent PCI, relook cath in am is prudent.  If not significantly different from post PCI, then we will plan medical therapy. Risks, benefits, and alternatives to heart cath with possible PCI were discussed at length with the patient who wishes to proceed. We will continue ASA, plavix, and beta blocker and place on a heparin gtt while cycling CMs.  2. Ischemic CM/ chronic systolic dysfunction- stable without CHF on exam  3. Dysphagia- occasionally feels that food "gets stuck".  Consider outpatient GI eval  4. Tobacco- cessation again encouraged  5. VT- no symptoms of arrhythmia,  Continue current medicine regimen.  6. HTN- stable  Hillis Range, MD 06/07/2011  4:11 PM

## 2011-06-08 ENCOUNTER — Encounter (HOSPITAL_COMMUNITY): Payer: Self-pay | Admitting: Physician Assistant

## 2011-06-08 ENCOUNTER — Encounter (HOSPITAL_COMMUNITY)
Admission: EM | Disposition: A | Discharge status: Home / Self Care | Payer: Self-pay | Source: Ambulatory Visit | Attending: Internal Medicine

## 2011-06-08 DIAGNOSIS — I2510 Atherosclerotic heart disease of native coronary artery without angina pectoris: Secondary | ICD-10-CM

## 2011-06-08 DIAGNOSIS — R079 Chest pain, unspecified: Principal | ICD-10-CM

## 2011-06-08 HISTORY — PX: LEFT HEART CATHETERIZATION WITH CORONARY ANGIOGRAM: SHX5451

## 2011-06-08 LAB — CARDIAC PANEL(CRET KIN+CKTOT+MB+TROPI)
CK, MB: 2.2 ng/mL (ref 0.3–4.0)
Relative Index: INVALID (ref 0.0–2.5)
Relative Index: INVALID (ref 0.0–2.5)
Total CK: 83 U/L (ref 7–232)
Troponin I: <0.3 ng/mL (ref <0.30)

## 2011-06-08 LAB — BASIC METABOLIC PANEL
BUN: 15 mg/dL (ref 6–23)
Creatinine, Ser: 0.98 mg/dL (ref 0.50–1.35)
GFR calc Af Amer: >90 mL/min (ref >90)
GFR calc non Af Amer: 89 mL/min — ABNORMAL LOW (ref >90)
Potassium: 4.2 mEq/L (ref 3.5–5.1)

## 2011-06-08 LAB — CBC
HCT: 36.3 % — ABNORMAL LOW (ref 39.0–52.0)
Platelets: 178 10*3/uL (ref 150–400)
RBC: 4.04 MIL/uL — ABNORMAL LOW (ref 4.22–5.81)
RDW: 13.5 % (ref 11.5–15.5)
WBC: 4.5 10*3/uL (ref 4.0–10.5)

## 2011-06-08 LAB — HEPARIN LEVEL (UNFRACTIONATED): Heparin Unfractionated: 0.55 IU/mL (ref 0.30–0.70)

## 2011-06-08 SURGERY — LEFT HEART CATHETERIZATION WITH CORONARY ANGIOGRAM
Anesthesia: LOCAL

## 2011-06-08 MED ORDER — MIDAZOLAM HCL 2 MG/2ML IJ SOLN
INTRAMUSCULAR | Status: AC
  Filled 2011-06-08: qty 2

## 2011-06-08 MED ORDER — GI COCKTAIL ~~LOC~~
30.0000 mL | Freq: Two times a day (BID) | ORAL | Status: DC | PRN
  Filled 2011-06-08: qty 30

## 2011-06-08 MED ORDER — HEPARIN (PORCINE) IN NACL 2-0.9 UNIT/ML-% IJ SOLN
INTRAMUSCULAR | Status: AC
  Filled 2011-06-08: qty 2000

## 2011-06-08 MED ORDER — NITROGLYCERIN IN D5W 200-5 MCG/ML-% IV SOLN
10.0000 ug/min | INTRAVENOUS | Status: DC
  Administered 2011-06-08: 10 ug/min via INTRAVENOUS

## 2011-06-08 MED ORDER — POTASSIUM CHLORIDE CRYS ER 10 MEQ PO TBCR: 10.0000 meq | EXTENDED_RELEASE_TABLET | Freq: Every day | ORAL | Status: DC

## 2011-06-08 MED ORDER — NITROGLYCERIN 0.2 MG/ML ON CALL CATH LAB
INTRAVENOUS | Status: AC
  Filled 2011-06-08: qty 1

## 2011-06-08 MED ORDER — ONDANSETRON HCL 4 MG/2ML IJ SOLN: 4.0000 mg | Freq: Four times a day (QID) | INTRAMUSCULAR | Status: DC | PRN

## 2011-06-08 MED ORDER — MORPHINE SULFATE 2 MG/ML IJ SOLN
2.0000 mg | INTRAMUSCULAR | Status: DC | PRN
  Administered 2011-06-08: 2 mg via INTRAVENOUS
  Filled 2011-06-08 (×2): qty 1

## 2011-06-08 MED ORDER — NITROGLYCERIN IN D5W 200-5 MCG/ML-% IV SOLN
INTRAVENOUS | Status: AC
  Filled 2011-06-08: qty 250

## 2011-06-08 MED ORDER — NITROGLYCERIN 0.4 MG SL SUBL
0.4000 mg | SUBLINGUAL_TABLET | SUBLINGUAL | Status: DC | PRN
  Administered 2011-06-08: 0.4 mg via SUBLINGUAL
  Filled 2011-06-08: qty 25

## 2011-06-08 MED ORDER — ACETAMINOPHEN 325 MG PO TABS: 650.0000 mg | ORAL_TABLET | ORAL | Status: DC | PRN

## 2011-06-08 MED ORDER — OXYCODONE-ACETAMINOPHEN 5-325 MG PO TABS: 1.0000 | ORAL_TABLET | ORAL | Status: DC | PRN

## 2011-06-08 MED ORDER — POTASSIUM CHLORIDE CRYS ER 10 MEQ PO TBCR
10.0000 meq | EXTENDED_RELEASE_TABLET | Freq: Every day | ORAL | Status: DC
  Administered 2011-06-08: 10 meq via ORAL
  Filled 2011-06-08: qty 1

## 2011-06-08 MED ORDER — OXYCODONE-ACETAMINOPHEN 5-325 MG PO TABS: 1.0000 | ORAL_TABLET | Freq: Once | ORAL | Status: DC | PRN

## 2011-06-08 MED ORDER — FENTANYL CITRATE 0.05 MG/ML IJ SOLN
INTRAMUSCULAR | Status: AC
  Filled 2011-06-08: qty 2

## 2011-06-08 MED ORDER — SODIUM CHLORIDE 0.9 % IV SOLN: INTRAVENOUS | Status: AC

## 2011-06-08 MED ORDER — LIDOCAINE HCL (PF) 1 % IJ SOLN
INTRAMUSCULAR | Status: AC
  Filled 2011-06-08: qty 30

## 2011-06-08 NOTE — H&P (View-Only) (Signed)
Patient: [name] Date of Encounter: 06/08/2011, 10:36 AM Admit date: 06/07/2011     Subjective  Feels much better than on admission, but still having 2/10 low grade chest pressure. No SOB. Dilaudid helped yesterday and he was able to sleep the night without pain. Got 2mg  of morphine this AM at 10:22 and is on NTG gtt @ 71mcg/min with no significant difference in symptoms.   Objective   Telemetry: NSR except 2 episodes of 6-beats NSVT, last at 9:40am EKG this AM shows NSR 71bpm septal infarct age undetermined, STdep/TWI I, II, avL, V4-V6 with biphasic appearing V3. Similar appearing to 06/01/11 but more prominent than post-PCI EKG 4/9 Physical Exam: Filed Vitals:   06/08/11 1016  BP: 104/62  Pulse: 63  Temp: 97.6  Resp: 16  Pox 98% RA General: Well developed, well nourished, in no acute distress. Head: Normocephalic, atraumatic, sclera non-icteric, no xanthomas, nares are without discharge.  Neck: Negative for carotid bruits. JVD not elevated. Lungs: Clear bilaterally to auscultation without wheezes, rales, or rhonchi. Breathing is unlabored. Heart: RRR with distant heart sounds without murmurs, rubs, or gallops.  Abdomen: Soft, non-tender, non-distended with normoactive bowel sounds. No hepatomegaly. No rebound/guarding. No obvious abdominal masses. Msk:  Strength and tone appear normal for age. Extremities: No clubbing or cyanosis. Trace sockline edema.  Distal pedal pulses are 2+ and equal bilaterally. Neuro: Alert and oriented X 3. Moves all extremities spontaneously. Psych:  Responds to questions appropriately with a normal affect.    Intake/Output Summary (Last 24 hours) at 06/08/11 1036 Last data filed at 06/08/11 0900  Gross per 24 hour  Intake      0 ml  Output      0 ml  Net      0 ml    Inpatient Medications:    . aspirin  324 mg Oral Once  . aspirin EC  81 mg Oral Daily  . atorvastatin  80 mg Oral QHS  . carvedilol  25 mg Oral BID WC  . clopidogrel  75 mg  Oral Q breakfast  . heparin  4,000 Units Intravenous Once  .  HYDROmorphone (DILAUDID) injection  1 mg Intravenous Once  . lisinopril  20 mg Oral Daily  . nitroGLYCERIN      . nitroGLYCERIN      . sodium chloride  3 mL Intravenous Q12H  . DISCONTD: aspirin EC  81 mg Oral Daily  . DISCONTD: clopidogrel  75 mg Oral Q breakfast    Labs:  [facility] 06/08/11 0332 06/07/11 1322  NA 135 134*  K 4.2 4.2  CL 105 103  CO2 24 24  GLUCOSE 94 93  BUN 15 16  CREATININE 0.98 0.95  CALCIUM 8.6 9.2  MG -- --  PHOS -- --    Basename 06/08/11 0332 06/07/11 1322  WBC 4.5 5.4  NEUTROABS -- 3.1  HGB 12.5* 13.6  HCT 36.3* 39.1  MCV 89.9 88.9  PLT 178 210    Basename 06/08/11 0916 06/08/11 0332 06/07/11 1954 06/07/11 1323  CKTOTAL PENDING 83 94 107  CKMB 2.2 2.0 2.0 2.1  TROPONINI <0.30 <0.30 <0.30 <0.30   Radiology/Studies:  1. Chest 2 View 06/07/2011  *RADIOLOGY REPORT*  Clinical Data: Chest pain  CHEST - 2 VIEW  Comparison: Chest radiograph 02/24/2008  Findings: Left-sided pacemaker overlies normal cardiac silhouette. No effusion, infiltrate, or pneumothorax.  Prior cholecystectomy. Degenerative osteophytosis of the thoracic spine.  Nodular apical thickening is likely benign apical thickening.  IMPRESSION: No acute  cardiopulmonary process.  Original Report Authenticated By: Genevive Bi, M.D.    Assessment and Plan   1. Chest pain felt worrisome for [place] with negative cardiac enzymes thus far 2. CAD s/p recent BMS to LAD 06/01/11 3. ICM EF 20-25% with h/o ventricular flutter treated with rx shock 04/2011, occasional NSVT  Keep NPO for cath. Will give SL NTG now and uptitrate gtt as blood pressure allows. Cath sooner if unable to get pain free. Continue ASA, BB, statin, Plavix. If stent shows some ISR or progressive disease consider checking PRU for responsiveness.   EDITED TO ADD: Did not see other extender's note filed until I signed this one, hence the 2 rounding notes today.  Nurse  made aware of plan.  Signed, Ronie Spies PA-C As above, patient seen and examined; recurrent chest pain this am now resolved; enzymes neg; plan cath today (risks and benefits discussed and patient agrees to proceed). Continue present meds. Outpatient GI eval for dysphagia. Olga Millers

## 2011-06-08 NOTE — Discharge Summary (Signed)
Discharge Summary   Patient ID: [name],  MRN: [id number], DOB/AGE: 09-10-1955 56 y.o.  Admit date: 06/07/2011 Discharge date: 06/08/2011  Discharge Diagnoses Principal Problem:  *CHEST PAIN-UNSPECIFIED Active Problems:  HYPERTENSION, UNSPECIFIED  CARDIOMYOPATHY, ISCHEMIC  VENTRICULAR TACHYCARDIA  TOBACCO ABUSE  Ischemic cardiomyopathy  Automatic implantable cardiac defibrillator in situ  CAD (coronary artery disease)  Unstable angina   Allergies No Known Allergies  Diagnostic Studies/Procedures  CARDIAC CATHETERIZATION- 06/08/11  Indication: Recurrent CP s/p recent PCI of LAD  Procedures performed:  1) Selective coronary angiography  Description of procedure:   The risks and indication of the procedure were explained. Consent was signed and placed on the chart. An appropriate timeout was taken prior to the procedure. After a normal Allen's test was confirmed, the right wrist was prepped and draped in the routine sterile fashion and anesthetized with 1% local lidocaine.  A 5 FR arterial sheath was then placed in the right radial artery using a modified Seldinger technique. Systemic heparin was administered. 1.25 mg IV nicardipine was given through the sheath. Standard catheters including a JL 3.5, JR4 and straight pigtail were used. All catheter exchanges were made over a wire.  Complications: None apparent  Findings:  Ao Pressure: 87/51 (67)  Left mainstem: Normal.  Left anterior descending (LAD): There is a long stented segment in the proximal to mid LAD is patent. There is diffuse 10-20% narrowing within the stent. The recently placed stent in the mid LAD is widely patent. It jails the ostium of the second diagonal branch with a 90% lesion. Theres is TIMI-3 in the ostium of the D2.  Left circumflex (LCx): A left circumflex is a large vessel. There is a 50-60% stenosis in the proximal vessel. He first obtuse marginal vessel is very small in caliber and is sub-totally  occluded proximally. The second and third marginal branches are large vessels. There is 30% lesion in ostium of OM-2  Right coronary artery (RCA): The right coronary has an inferior take off. It is a codominant vessel. There is 40% narrowing at the ostium. A stent is noted in the mid right coronary and is widely patent with less than 10-20% irregularities. There is a 30-40% lesion in the distal right coronary.  Assessment:  1. Stable CAD as described above. The D2 is jailed by the recently placed LAD stent but there is good flow in the vessel and doubt this is responsible for his CP  Plan/Discussion:  Can d/c home later tonight if access site ok.  History of Present Illness  [name] is a 56yo male with PMHx significant for (s/p BMS-LAD 06/01/11), ischemic cardiomyopathy (EF 20-25%), history of ventricular flutter (s/p rx shock 03/13), HTN, HL and anxiety who was admitted to [facility] on 05/07/11 with chest pain worrisome for unstable angina.   He had been admitted a little over 1 week ago with ventricular flutter requiring ICD shock. On 06/01/11, he underwent cardiac catheterization revealing severe LAD stenosis status post successful BMS placement. Symptoms improved and was discharged. He denied medication noncompliance. Last week, he began having recurrent chest pressure described as constant and rated at a 4/10. He stated this occurred mostly with rest, with no relation to exertion. He denied ICD shocks. On the day of admission, he took nitroglycerin sublingually x3 with improvement in his chest pain. He related this chest pain this previous episodes of angina.  Upon ED arrival, received Dilaudid with complete resolution of his chest pain. EKG revealed sinus rhythm with  prior evidence of infarction and T wave inversions similar to a recent prior tracing. Initial cardiac enzymes were found to be negative. Chest x-ray revealed no acute cardiopulmonary process. CBC and basic metabolic panel  were unremarkable. Given the similarity to his prior anginal episodes and recent cardiac catheterization, there is some concern for in stent restenosis even given the lack of ischemic evidence. The plan was to pursue repeat cardiac catheterization. He was subsequently admitted by the cardiology service.  Hospital Course   Recent medications that the patient had been discharged with a continued on admission. Cardiac enzymes were cycled and found to be negative x4. The following day, his chest pain improved greatly, but persisted. There are no acute events overnight. He was kept n.p.o. for anticipated catheterization later that day.   He was informed, consented and underwent the procedure as described above. The coronary arteries were accessed via the right radial artery. His son has stable coronary artery disease with good flow noted in the previous LAD stent. There is no culprit identified as an etiology for his chest pain. Recommendation was to continue medical therapy and risk factor reduction. The patient tolerated the procedure well without immediate complications. He was subsequently chest or to the cardiac rehabilitation unit. The patient did complain of some back pain, however this is chronic and relieved with narcotics.  He is stable, at baseline and will be discharged home. He will resume all outpatient medications. He will followup as previously scheduled. Potassium chloride was added to his discharge medications given his daily Lasix dosing. This information, including activity restrictions, as clearly outlined in the discharge AVS.  Discharge Vitals:  Blood pressure 96/53, pulse 67, temperature 98.4 F (36.9 C), temperature source Oral, resp. rate 18, height 5\' 11"  (1.803 m), weight 110.678 kg (244 lb), SpO2 99.00%.   Labs: Recent Labs  Basename 06/08/11 0332 06/07/11 1322   WBC 4.5 5.4   HGB 12.5* 13.6   HCT 36.3* 39.1   MCV 89.9 88.9   PLT 178 210   Lab 06/08/11 0332 06/07/11  1322 06/02/11 0400  NA 135 134* 137  K 4.2 4.2 4.5  CL 105 103 105  CO2 24 24 27   BUN 15 16 17   CREATININE 0.98 0.95 1.11  CALCIUM 8.6 9.2 9.0  PROT -- -- --  BILITOT -- -- --  ALKPHOS -- -- --  ALT -- -- --  AST -- -- --  AMYLASE -- -- --  LIPASE -- -- --  GLUCOSE 94 93 99   Recent Labs  Basename 06/08/11 0916 06/08/11 0332 06/07/11 1954   CKTOTAL 91 83 94   CKMB 2.2 2.0 2.0   CKMBINDEX -- -- --   TROPONINI <0.30 <0.30 <0.30   Disposition:  Discharge Orders    Future Appointments: Provider: Department: Dept Phone: Center:   06/17/2011 2:30 PM [name], MD [facility] [phone] [facility]     Discharge Medications:  Medication List  As of 06/08/2011  6:32 PM   CONTINUE taking these medications         aspirin EC 81 MG tablet      carvedilol 12.5 MG tablet   Commonly known as: COREG      clopidogrel 75 MG tablet   Commonly known as: PLAVIX      furosemide 20 MG tablet   Commonly known as: LASIX      lisinopril 20 MG tablet   Commonly known as: PRINIVIL,ZESTRIL      nicotine 21 mg/24hr  patch   Commonly known as: NICODERM CQ - dosed in mg/24 hours      nitroGLYCERIN 0.4 MG SL tablet   Commonly known as: NITROSTAT      simvastatin 80 MG tablet   Commonly known as: ZOCOR           Outstanding Labs/Studies: None  Duration of Discharge Encounter: Greater than 30 minutes including physician time.  Signed, R. Hurman Horn, PA-C 06/08/2011, 6:32 PM

## 2011-06-08 NOTE — Progress Notes (Signed)
ANTICOAGULATION CONSULT NOTE - Follow Up Consult  Pharmacy Consult for heparin Indication: chest pain/ACS  Labs:  Basename 06/08/11 0332 06/07/11 1954 06/07/11 1323 06/07/11 1322  HGB 12.5* -- -- 13.6  HCT 36.3* -- -- 39.1  PLT 178 -- -- 210  APTT -- -- -- 31  LABPROT 13.5 -- -- 13.3  INR 1.01 -- -- 0.99  HEPARINUNFRC 0.55 -- -- --  CREATININE 0.98 -- -- 0.95  CKTOTAL 83 94 107 --  CKMB 2.0 2.0 2.1 --  TROPONINI <0.30 <0.30 <0.30 --    Assessment/Plan: 56yo male therapeutic on heparin with initial dosing for CP.  Will continue gtt at current rate and confirm stable with next CE.   Colleen Can PharmD BCPS 06/08/2011,4:46 AM

## 2011-06-08 NOTE — Progress Notes (Signed)
ANTICOAGULATION CONSULT NOTE - Follow Up Consult  Pharmacy Consult for Heparin Indication: chest pain/ACS  No Known Allergies  Patient Measurements: Height: 5\' 11"  (180.3 cm) Weight: 244 lb (110.678 kg) IBW/kg (Calculated) : 75.3  Heparin Dosing Weight: 98.9 kg  Vital Signs: Temp: 97.6 F (36.4 C) (04/15 0600) BP: 87/52 mmHg (04/15 1230) Pulse Rate: 58  (04/15 1230)  Labs:  Basename 06/08/11 0916 06/08/11 0332 06/07/11 1954 06/07/11 1322  HGB -- 12.5* -- 13.6  HCT -- 36.3* -- 39.1  PLT -- 178 -- 210  APTT -- -- -- 31  LABPROT -- 13.5 -- 13.3  INR -- 1.01 -- 0.99  HEPARINUNFRC 0.49 0.55 -- --  CREATININE -- 0.98 -- 0.95  CKTOTAL 91 83 94 --  CKMB 2.2 2.0 2.0 --  TROPONINI <0.30 <0.30 <0.30 --   Estimated Creatinine Clearance: 104 ml/min (by C-G formula based on Cr of 0.98).   Assessment: 56 y.o. M on heparin for ACS sx while awaiting cardiac cath planned for today, 4/15. Heparin level again this morning was therapeutic (HL 0.49, goal of 0.3-0.7). No s/sx of bleeding noted. Heparin infusing at appropriate rate.   Goal of Therapy:  Heparin level 0.3-0.7 units/ml   Plan:  1. Continue heparin at current rate of 1400 units/hr (14 ml/hr) 2. Will continue to monitor for any signs/symptoms of bleeding and will follow up with heparin level post cath today or in the a.m if cath postponed  Georgina Pillion, PharmD, BCPS Clinical Pharmacist Pager: [phone] 06/08/2011 12:45 PM

## 2011-06-08 NOTE — Progress Notes (Signed)
Patient provided with discharge instructions as well as with instructions for radial site care. Patient and spouse verbalized understanding of all instructions provided. Plans are for patient to follow up with Dr Graciela Husbands at his previously scheduled appt on April 24th. Patient discharged home with his wife.

## 2011-06-08 NOTE — Progress Notes (Signed)
At 1945 patient complained of 4/10 chest pain to center of chest. States pain has been a 2/10 since his procedure on Monday off and on but now increased. BP 117/57 with HR of 69. At 1948 patient given 1 SL NTG and O2 applied at 3 L [place]. A 12 lead EKG done without acute changes noted. At 1955 patients pain down to 2/10 with BP 99/52 and HR 69. J. Hope, PA on call was notified of EKG and pain with steps taken and current pain. No changes per PA. At 2010 Patients pain 1/10 with BP 108/53 HR 66. Patient feeling better and at 2015 patient pain free. Will cont to monitor patient closely.

## 2011-06-08 NOTE — Progress Notes (Signed)
 Patient: [name] Date of Encounter: 06/08/2011, 10:36 AM Admit date: 06/07/2011     Subjective  Feels much better than on admission, but still having 2/10 low grade chest pressure. No SOB. Dilaudid helped yesterday and he was able to sleep the night without pain. Got 2mg of morphine this AM at 10:22 and is on NTG gtt @ 10mcg/min with no significant difference in symptoms.   Objective   Telemetry: NSR except 2 episodes of 6-beats NSVT, last at 9:40am EKG this AM shows NSR 71bpm septal infarct age undetermined, STdep/TWI I, II, avL, V4-V6 with biphasic appearing V3. Similar appearing to 06/01/11 but more prominent than post-PCI EKG 4/9 Physical Exam: Filed Vitals:   06/08/11 1016  BP: 104/62  Pulse: 63  Temp: 97.6  Resp: 16  Pox 98% RA General: Well developed, well nourished, in no acute distress. Head: Normocephalic, atraumatic, sclera non-icteric, no xanthomas, nares are without discharge.  Neck: Negative for carotid bruits. JVD not elevated. Lungs: Clear bilaterally to auscultation without wheezes, rales, or rhonchi. Breathing is unlabored. Heart: RRR with distant heart sounds without murmurs, rubs, or gallops.  Abdomen: Soft, non-tender, non-distended with normoactive bowel sounds. No hepatomegaly. No rebound/guarding. No obvious abdominal masses. Msk:  Strength and tone appear normal for age. Extremities: No clubbing or cyanosis. Trace sockline edema.  Distal pedal pulses are 2+ and equal bilaterally. Neuro: Alert and oriented X 3. Moves all extremities spontaneously. Psych:  Responds to questions appropriately with a normal affect.    Intake/Output Summary (Last 24 hours) at 06/08/11 1036 Last data filed at 06/08/11 0900  Gross per 24 hour  Intake      0 ml  Output      0 ml  Net      0 ml    Inpatient Medications:    . aspirin  324 mg Oral Once  . aspirin EC  81 mg Oral Daily  . atorvastatin  80 mg Oral QHS  . carvedilol  25 mg Oral BID WC  . clopidogrel  75 mg  Oral Q breakfast  . heparin  4,000 Units Intravenous Once  .  HYDROmorphone (DILAUDID) injection  1 mg Intravenous Once  . lisinopril  20 mg Oral Daily  . nitroGLYCERIN      . nitroGLYCERIN      . sodium chloride  3 mL Intravenous Q12H  . DISCONTD: aspirin EC  81 mg Oral Daily  . DISCONTD: clopidogrel  75 mg Oral Q breakfast    Labs:  Basename 06/08/11 0332 06/07/11 1322  NA 135 134*  K 4.2 4.2  CL 105 103  CO2 24 24  GLUCOSE 94 93  BUN 15 16  CREATININE 0.98 0.95  CALCIUM 8.6 9.2  MG -- --  PHOS -- --    Basename 06/08/11 0332 06/07/11 1322  WBC 4.5 5.4  NEUTROABS -- 3.1  HGB 12.5* 13.6  HCT 36.3* 39.1  MCV 89.9 88.9  PLT 178 210    Basename 06/08/11 0916 06/08/11 0332 06/07/11 1954 06/07/11 1323  CKTOTAL PENDING 83 94 107  CKMB 2.2 2.0 2.0 2.1  TROPONINI <0.30 <0.30 <0.30 <0.30   Radiology/Studies:  1. Chest 2 View 06/07/2011  *RADIOLOGY REPORT*  Clinical Data: Chest pain  CHEST - 2 VIEW  Comparison: Chest radiograph 02/24/2008  Findings: Left-sided pacemaker overlies normal cardiac silhouette. No effusion, infiltrate, or pneumothorax.  Prior cholecystectomy. Degenerative osteophytosis of the thoracic spine.  Nodular apical thickening is likely benign apical thickening.  IMPRESSION: No acute   cardiopulmonary process.  Original Report Authenticated By: STEWART EDMUNDS, M.D.    Assessment and Plan   1. Chest pain felt worrisome for [place] with negative cardiac enzymes thus far 2. CAD s/p recent BMS to LAD 06/01/11 3. ICM EF 20-25% with h/o ventricular flutter treated with rx shock 04/2011, occasional NSVT  Keep NPO for cath. Will give SL NTG now and uptitrate gtt as blood pressure allows. Cath sooner if unable to get pain free. Continue ASA, BB, statin, Plavix. If stent shows some ISR or progressive disease consider checking PRU for responsiveness.   EDITED TO ADD: Did not see other extender's note filed until I signed this one, hence the 2 rounding notes today.  Nurse  made aware of plan.  Signed, Dayna Dunn PA-C As above, patient seen and examined; recurrent chest pain this am now resolved; enzymes neg; plan cath today (risks and benefits discussed and patient agrees to proceed). Continue present meds. Outpatient GI eval for dysphagia. [name]  

## 2011-06-08 NOTE — Significant Event (Signed)
Patient had a 9 beat run of non sustained vtach while lying in bed; BP 109/57 P 63; patient without any complaints of any kind; no obvious signs of acute distress noted. Odella Aquas, PA-C notified of the event; no new orders obtained. Plans are for patient discharge later this evening.

## 2011-06-08 NOTE — CV Procedure (Signed)
Cardiac Cath Procedure Note:  Indication: Recurrent CP s/p recent PCI of LAD  Procedures performed:  1) Selective coronary angiography   Description of procedure:   The risks and indication of the procedure were explained. Consent was signed and placed on the chart. An appropriate timeout was taken prior to the procedure. After a normal Allen's test was confirmed, the right wrist was prepped and draped in the routine sterile fashion and anesthetized with 1% local lidocaine.   A 5 FR arterial sheath was then placed in the right radial artery using a modified Seldinger technique. Systemic heparin was administered. 1.25 mg IV nicardipine  was given through the sheath. Standard catheters including a JL 3.5, JR4 and straight pigtail were used. All catheter exchanges were made over a wire.  Complications:  None apparent  Findings:  Ao Pressure: 87/51 (67)  Left mainstem: Normal.   Left anterior descending (LAD): There is a long stented segment in the proximal to mid LAD is patent. There is diffuse 10-20% narrowing within the stent. The recently placed stent in the mid LAD is widely patent. It jails the ostium of the second diagonal branch with a 90% lesion. Theres is TIMI-3 in the ostium of the D2.   Left circumflex (LCx): A left circumflex is a large vessel. There is a 50-60% stenosis in the proximal vessel. He first obtuse marginal vessel is very small in caliber and is sub-totally occluded proximally. The second and third marginal branches are large vessels. There is 30% lesion in ostium of OM-2   Right coronary artery (RCA): The right coronary has an inferior take off. It is a codominant vessel. There is 40% narrowing at the ostium. A stent is noted in the mid right coronary and is widely patent with less than 10-20% irregularities. There is a 30-40% lesion in the distal right coronary.  Assessment: 1. Stable CAD as described above. The D2 is jailed by the recently placed LAD stent but  there is good flow in the vessel and doubt this is responsible for his CP  Plan/Discussion:  Can d/c home later tonight if access site ok.  Arelene Moroni 6:00 PM

## 2011-06-08 NOTE — Interval H&P Note (Signed)
History and Physical Interval Note:  06/08/2011 5:27 PM  [name]  has presented today for surgery, with the diagnosis of cp  The various methods of treatment have been discussed with the patient and family. After consideration of risks, benefits and other options for treatment, the patient has consented to  Procedure(s) (LRB): LEFT HEART CATHETERIZATION WITH CORONARY ANGIOGRAM (N/A) as a surgical intervention .  The patients' history has been reviewed, patient examined, no change in status, stable for surgery.  I have reviewed the patients' chart and labs.  Questions were answered to the patient's satisfaction.     [name]

## 2011-06-08 NOTE — Progress Notes (Signed)
TR BAND REMOVAL  LOCATION:    right radial  DEFLATED PER PROTOCOL:    yes  TIME BAND OFF / DRESSING APPLIED:    2115   SITE UPON ARRIVAL:    Level 0  SITE AFTER BAND REMOVAL:    Level 0  REVERSE ALLEN'S TEST:     positive  CIRCULATION SENSATION AND MOVEMENT:    Within Normal Limits   yes  COMMENTS:   Dry gauze applied to radial site; site covered with transparent dressing. Patient and spouse provided with site care instructions.

## 2011-06-08 NOTE — Progress Notes (Signed)
Patient Name: [name] Date of Encounter: 06/08/2011  Active Problems:  HYPERTENSION, UNSPECIFIED  CARDIOMYOPATHY, ISCHEMIC  VENTRICULAR TACHYCARDIA  CHEST PAIN-UNSPECIFIED  TOBACCO ABUSE  Ischemic cardiomyopathy  Automatic implantable cardiac defibrillator in situ  CAD (coronary artery disease)  Unstable angina    SUBJECTIVE: Pt with 3/10 chest pain. His usual angina, it is responding to IV ntg and morphine. He is not significantly SOB, no N&V or diaphoresis.   OBJECTIVE  Filed Vitals:   06/07/11 2132 06/08/11 0600 06/08/11 0856 06/08/11 1016  BP: 108/56 111/61 128/74 104/62  Pulse:  70 66 63  Temp:  97.6 F (36.4 C)    TempSrc:      Resp:  16    Height:      Weight:      SpO2:  98%      Intake/Output Summary (Last 24 hours) at 06/08/11 1029 Last data filed at 06/08/11 0900  Gross per 24 hour  Intake      0 ml  Output      0 ml  Net      0 ml   Weight change:  Wt Readings from Last 3 Encounters:  06/07/11 244 lb (110.678 kg)  06/07/11 244 lb (110.678 kg)  06/02/11 245 lb 2.4 oz (111.2 kg)     PHYSICAL EXAM  General: Well developed, well nourished, male in mild acute distress. Head: Normocephalic, atraumatic.  Neck: Supple without bruits, JVD not elevated. Lungs:  Resp regular and unlabored, CTA bilaterally. Heart: RRR, S1, S2, no S3, S4, soft systolic murmurs. Abdomen: Soft, non-tender, non-distended, BS + x 4.  Extremities: No clubbing, cyanosis, no edema.  Neuro: Alert and oriented X 3. Moves all extremities spontaneously. Psych: Normal affect.  LABS:  CBC: Basename 06/08/11 0332 06/07/11 1322  WBC 4.5 5.4  NEUTROABS -- 3.1  HGB 12.5* 13.6  HCT 36.3* 39.1  MCV 89.9 88.9  PLT 178 210   INR: Basename 06/08/11 0332  INR 1.01   Basic Metabolic Panel: Basename 06/08/11 0332 06/07/11 1322  NA 135 134*  K 4.2 4.2  CL 105 103  CO2 24 24  GLUCOSE 94 93  BUN 15 16  CREATININE 0.98 0.95  CALCIUM 8.6 9.2  MG -- --  PHOS -- --    Cardiac Enzymes: Basename 06/08/11 0332 06/07/11 1954 06/07/11 1323  CKTOTAL 83 94 107  CKMB 2.0 2.0 2.1  CKMBINDEX -- -- --  TROPONINI <0.30 <0.30 <0.30    TELE:  SR   Radiology/Studies: Dg Chest 2 View 06/07/2011  *RADIOLOGY REPORT*  Clinical Data: Chest pain  CHEST - 2 VIEW  Comparison: Chest radiograph 02/24/2008  Findings: Left-sided pacemaker overlies normal cardiac silhouette. No effusion, infiltrate, or pneumothorax.  Prior cholecystectomy. Degenerative osteophytosis of the thoracic spine.  Nodular apical thickening is likely benign apical thickening.  IMPRESSION: No acute cardiopulmonary process.  Original Report Authenticated By: Genevive Bi, M.D.    Current Medications:     . aspirin  324 mg Oral Once  . aspirin EC  81 mg Oral Daily  . atorvastatin  80 mg Oral QHS  . carvedilol  25 mg Oral BID WC  . clopidogrel  75 mg Oral Q breakfast  . heparin  4,000 Units Intravenous Once  .  HYDROmorphone (DILAUDID) injection  1 mg Intravenous Once  . lisinopril  20 mg Oral Daily  . nitroGLYCERIN      . nitroGLYCERIN      . sodium chloride  3 mL Intravenous Q12H  .  DISCONTD: aspirin EC  81 mg Oral Daily  . DISCONTD: clopidogrel  75 mg Oral Q breakfast    ASSESSMENT AND PLAN: 1. Chest pain - enzymes negative for MI but it is his usual angina. He is for cath today, add IV NTG and morphine for pain control, urgent cath if this does not work. Labs are OK, no other issues.  SignedTheodore Demark , PA-C 10:29 AM 06/08/2011 See next note Olga Millers

## 2011-06-08 NOTE — Discharge Instructions (Signed)
PLEASE REMEMBER TO BRING ALL OF YOUR MEDICATIONS TO EACH OF YOUR FOLLOW-UP OFFICE VISITS.  Activity: Increase activity slowly as tolerated. You may shower, but no soaking baths for 1 week. No driving for 2 days. No lifting over 5 lbs for 1 week. No sexual activity for 1 week.   You May Return to Work: in 1 week (if applicable)  Wound Care: You may wash cath site gently with soap and water. Keep cath site clean and dry. If you notice pain, swelling, bleeding or pus at your cath site, please call/return!

## 2011-06-08 NOTE — Progress Notes (Addendum)
Pt continues to complain of 2/10 dull chest pressure/sharpness and lightheadedness. 1 SL nitro administered per order. Recheck BP 84/48, HR 62, and no change in pain. Order to give 200 cc NS bolus. Recheck BP 83/46. Also made aware that pt has 6 beats of Vtach this am, around time when CP started. Pt is now pain free. Orders to hold nitro drip for now received. Will continue to monitor BP closely. Duwaine Maxin, RN

## 2011-06-08 NOTE — Progress Notes (Signed)
Pt complaining of 3/10 sharp chest pain and light-headedness. BP 119/73, HR 60. EKG obtained. Placed on 4L O2. [name] at bedside. New orders received for morphine and IV nitro.  Pain down to 2/10 after administered. Will continue to monitor closely. Duwaine Maxin, RN

## 2011-06-09 NOTE — Discharge Summary (Signed)
Patient seen and examined with Hurman Horn PA-C. We discussed all aspects of the encounter. I agree with the assessment and plan as stated above. See my cath report from discharge day for full details. CAD is stable. No evidence MI. Continue risk reduction strategies.   Nyzir Dubois,MD 11:24 PM

## 2011-06-12 MED FILL — Nicardipine HCl IV Soln 2.5 MG/ML: INTRAVENOUS | Qty: 1 | Status: AC

## 2011-06-17 ENCOUNTER — Encounter: Payer: Self-pay | Admitting: Internal Medicine

## 2011-06-17 ENCOUNTER — Ambulatory Visit (INDEPENDENT_AMBULATORY_CARE_PROVIDER_SITE_OTHER): Payer: Self-pay | Admitting: Internal Medicine

## 2011-06-17 VITALS — BP 119/79 | HR 70 | Ht 71.0 in | Wt 240.1 lb

## 2011-06-17 DIAGNOSIS — I255 Ischemic cardiomyopathy: Secondary | ICD-10-CM

## 2011-06-17 DIAGNOSIS — F17200 Nicotine dependence, unspecified, uncomplicated: Secondary | ICD-10-CM

## 2011-06-17 DIAGNOSIS — I472 Ventricular tachycardia, unspecified: Secondary | ICD-10-CM

## 2011-06-17 DIAGNOSIS — I2589 Other forms of chronic ischemic heart disease: Secondary | ICD-10-CM

## 2011-06-17 DIAGNOSIS — Z95810 Presence of automatic (implantable) cardiac defibrillator: Secondary | ICD-10-CM

## 2011-06-17 LAB — ICD DEVICE OBSERVATION
BATTERY VOLTAGE: 3.1 V
DEV-0020ICD: NEGATIVE
TZAT-0001SLOWVT: 1
TZAT-0012SLOWVT: 200 ms
TZAT-0018FASTVT: NEGATIVE
TZAT-0018SLOWVT: NEGATIVE
TZAT-0019FASTVT: 8 V
TZAT-0019SLOWVT: 8 V
TZAT-0020FASTVT: 1.5 ms
TZAT-0020SLOWVT: 1.5 ms
TZON-0003SLOWVT: 360 ms
TZON-0003VSLOWVT: 450 ms
TZON-0004SLOWVT: 16
TZST-0001FASTVT: 2
TZST-0001FASTVT: 6
TZST-0001SLOWVT: 2
TZST-0001SLOWVT: 4
TZST-0001SLOWVT: 6
TZST-0002FASTVT: NEGATIVE
TZST-0002FASTVT: NEGATIVE
TZST-0002SLOWVT: NEGATIVE
VENTRICULAR PACING ICD: 0 pct

## 2011-06-17 NOTE — Assessment & Plan Note (Signed)
He has had no recurrent ventricular tachycardia. Given its association with coronary disease, we will release him to drive 3 months following the event i.e. June 29.

## 2011-06-17 NOTE — Assessment & Plan Note (Signed)
Hallelujah. He has stopped smoking. He is on the patch. I suggested concurrent use of lozenges.

## 2011-06-17 NOTE — Assessment & Plan Note (Signed)
Continue him on his current medications.

## 2011-06-17 NOTE — Assessment & Plan Note (Signed)
The patient's device was interrogated.  The information was reviewed. No changes were made in the programming.    

## 2011-06-17 NOTE — Progress Notes (Signed)
HPI  [name] is a 56 y.o. male is seen in followup for ICD implanted as per the MASTER trial with prior myocardial infarction complicated by shock and prior stenting.  Underwent catheterization in 2010demonstrated ejection fraction of 35% and. Severe LAD stenosis secondary to acute thrombus.    Because of ventricular tachycardia and exertional chest pain shortness of breath he underwent catheterization April 2012 demonstrating a new lesion in the mid LAD that was felt to be critical proximal mid LAD stents and mid RCA stents were patent. LVEF was 20-25% 3: He was readmitted a week or so later with recurrent chest pain and underwent repeat catheterization demonstrating no significant changes  He is currently doing pretty well without chest pain or shortness of breath. Social status is stable.       Past Medical History  Diagnosis Date  . Automatic implantable cardiac defibrillator in situ     a. s/p MDT [facility]  . HTN (hypertension)   . HLD (hyperlipidemia) mixed  . Ischemic cardiomyopathy     a.  EF 30-35% 2010;  b.  EF 20-25% by LV gram 05/2011  . CAD (coronary artery disease)     a.  Severe LAD stenosis secondary to acute thrombus - BMS 2009;  b. 06/01/11 Cath - LM nl, LAD 20 isr, 30m, LCX 50p, OM1 100p, RCA 40ost, patent mid-stent, 30d, EF 20-25% - The LAD was treated with a 3.0x20mm Veri-flex BMS  . Anxiety   . Paroxysmal ventricular tachycardia     a. VFlutter  CL 210 msec  Rx shock 04/2011  . Systolic CHF, chronic   . Myocardial infarction     Past Surgical History  Procedure Date  . Laproscopic cholecystectomy   . Defibrillator implantation 04/12/02    primary prevention for sudden death; Dr. Graciela Husbands   . Cardiac stent  lad 06/01/2011  . Cardiac catheterization   . Cardiac defibrillator placement     Current Outpatient Prescriptions  Medication Sig Dispense Refill  . aspirin EC 81 MG tablet Take 81 mg by mouth daily.      . carvedilol (COREG) 12.5 MG tablet Take  12.5 mg by mouth 2 (two) times daily with a meal. Take one tablet by mouth twice daily      . clopidogrel (PLAVIX) 75 MG tablet Take 75 mg by mouth daily with breakfast.      . furosemide (LASIX) 20 MG tablet Take 20 mg by mouth daily.      [name] lisinopril (PRINIVIL,ZESTRIL) 20 MG tablet Take 20 mg by mouth daily.      . nicotine (NICODERM CQ - DOSED IN MG/24 HOURS) 21 mg/24hr patch Place 1 patch onto the skin daily.      . nitroGLYCERIN (NITROSTAT) 0.4 MG SL tablet Place 0.4 mg under the tongue every 5 (five) minutes as needed. For chest pain      . simvastatin (ZOCOR) 80 MG tablet Take 80 mg by mouth at bedtime.        No Known Allergies  Review of Systems negative except from HPI and PMH  Physical Exam BP 119/79  Pulse 70  Ht 5\' 11"  (1.803 m)  Wt 240 lb 1.9 oz (108.918 kg)  BMI 33.49 kg/m2 Well developed and well nourished in no acute distress HENT normal E scleral and icterus clear Neck Supple JVP flat carotids brisk and full Clear to ausculation Regular rate and rhythm, no murmurs gallops or rub Soft with active bowel sounds No clubbing cyanosis none Edema Alert  and oriented, grossly normal motor and sensory function Skin Warm and Dry     Assessment and  Plan

## 2011-06-17 NOTE — Patient Instructions (Addendum)
Remote monitoring is used to monitor your Pacemaker of ICD from home. This monitoring reduces the number of office visits required to check your device to one time per year. It allows [place] to keep an eye on the functioning of your device to ensure it is working properly. You are scheduled for a device check from home on September 24, 2011. You may send your transmission at any time that day. If you have a wireless device, the transmission will be sent automatically. After your physician reviews your transmission, you will receive a postcard with your next transmission date.  Your physician wants you to follow-up in: 6 months with Dr. Graciela Husbands. You will receive a reminder letter in the mail two months in advance. If you don't receive a letter, please call our office to schedule the follow-up appointment.  Your physician recommends that you continue on your current medications as directed. Please refer to the Current Medication list given to you today.

## 2011-09-08 ENCOUNTER — Emergency Department (HOSPITAL_COMMUNITY)
Admission: EM | Admit: 2011-09-08 | Discharge: 2011-09-08 | Disposition: A | Discharge status: Home / Self Care | Payer: Self-pay | Attending: Emergency Medicine | Admitting: Emergency Medicine

## 2011-09-08 ENCOUNTER — Encounter (HOSPITAL_COMMUNITY): Payer: Self-pay | Admitting: Emergency Medicine

## 2011-09-08 DIAGNOSIS — Z79899 Other long term (current) drug therapy: Secondary | ICD-10-CM | POA: Insufficient documentation

## 2011-09-08 DIAGNOSIS — I252 Old myocardial infarction: Secondary | ICD-10-CM | POA: Insufficient documentation

## 2011-09-08 DIAGNOSIS — R1013 Epigastric pain: Secondary | ICD-10-CM | POA: Insufficient documentation

## 2011-09-08 DIAGNOSIS — R109 Unspecified abdominal pain: Secondary | ICD-10-CM

## 2011-09-08 DIAGNOSIS — I10 Essential (primary) hypertension: Secondary | ICD-10-CM | POA: Insufficient documentation

## 2011-09-08 DIAGNOSIS — R1011 Right upper quadrant pain: Secondary | ICD-10-CM | POA: Insufficient documentation

## 2011-09-08 DIAGNOSIS — I2589 Other forms of chronic ischemic heart disease: Secondary | ICD-10-CM | POA: Insufficient documentation

## 2011-09-08 DIAGNOSIS — E860 Dehydration: Secondary | ICD-10-CM

## 2011-09-08 DIAGNOSIS — I509 Heart failure, unspecified: Secondary | ICD-10-CM | POA: Insufficient documentation

## 2011-09-08 DIAGNOSIS — I5022 Chronic systolic (congestive) heart failure: Secondary | ICD-10-CM | POA: Insufficient documentation

## 2011-09-08 DIAGNOSIS — I2510 Atherosclerotic heart disease of native coronary artery without angina pectoris: Secondary | ICD-10-CM | POA: Insufficient documentation

## 2011-09-08 DIAGNOSIS — E785 Hyperlipidemia, unspecified: Secondary | ICD-10-CM | POA: Insufficient documentation

## 2011-09-08 DIAGNOSIS — Z95810 Presence of automatic (implantable) cardiac defibrillator: Secondary | ICD-10-CM | POA: Insufficient documentation

## 2011-09-08 LAB — COMPREHENSIVE METABOLIC PANEL
CO2: 24 mEq/L (ref 19–32)
Calcium: 9.7 mg/dL (ref 8.4–10.5)
Creatinine, Ser: 1.64 mg/dL — ABNORMAL HIGH (ref 0.50–1.35)
GFR calc Af Amer: 52 mL/min — ABNORMAL LOW (ref >90)
GFR calc non Af Amer: 45 mL/min — ABNORMAL LOW (ref >90)
Glucose, Bld: 94 mg/dL (ref 70–99)

## 2011-09-08 LAB — URINALYSIS, ROUTINE W REFLEX MICROSCOPIC
Protein, ur: 100 mg/dL — AB
Urobilinogen, UA: 0.2 mg/dL (ref 0.0–1.0)

## 2011-09-08 LAB — CBC
MCH: 31.1 pg (ref 26.0–34.0)
MCV: 91.6 fL (ref 78.0–100.0)
Platelets: 187 10*3/uL (ref 150–400)
RBC: 4.28 MIL/uL (ref 4.22–5.81)

## 2011-09-08 LAB — LIPASE, BLOOD: Lipase: 39 U/L (ref 11–59)

## 2011-09-08 LAB — URINE MICROSCOPIC-ADD ON

## 2011-09-08 MED ORDER — ONDANSETRON HCL 4 MG/2ML IJ SOLN
4.0000 mg | Freq: Once | INTRAMUSCULAR | Status: AC
  Administered 2011-09-08: 4 mg via INTRAVENOUS
  Filled 2011-09-08: qty 2

## 2011-09-08 MED ORDER — SODIUM CHLORIDE 0.9 % IV BOLUS (SEPSIS)
500.0000 mL | Freq: Once | INTRAVENOUS | Status: AC
  Administered 2011-09-08: 500 mL via INTRAVENOUS

## 2011-09-08 MED ORDER — CEPHALEXIN 250 MG PO CAPS: 1000.0000 mg | ORAL_CAPSULE | Freq: Once | ORAL | Status: DC

## 2011-09-08 MED ORDER — SODIUM CHLORIDE 0.9 % IV BOLUS (SEPSIS)
1000.0000 mL | Freq: Once | INTRAVENOUS | Status: AC
  Administered 2011-09-08: 1000 mL via INTRAVENOUS

## 2011-09-08 MED ORDER — HYDROMORPHONE HCL PF 1 MG/ML IJ SOLN
1.0000 mg | Freq: Once | INTRAMUSCULAR | Status: AC
  Administered 2011-09-08: 1 mg via INTRAVENOUS
  Filled 2011-09-08: qty 1

## 2011-09-08 MED ORDER — CEPHALEXIN 500 MG PO CAPS: 500.0000 mg | ORAL_CAPSULE | Freq: Three times a day (TID) | ORAL | Status: DC

## 2011-09-08 MED ORDER — PANTOPRAZOLE SODIUM 40 MG IV SOLR
40.0000 mg | Freq: Once | INTRAVENOUS | Status: AC
  Administered 2011-09-08: 40 mg via INTRAVENOUS
  Filled 2011-09-08: qty 40

## 2011-09-08 MED ORDER — HYDROMORPHONE HCL PF 1 MG/ML IJ SOLN
0.5000 mg | Freq: Once | INTRAMUSCULAR | Status: AC
  Administered 2011-09-08: 0.5 mg via INTRAVENOUS
  Filled 2011-09-08: qty 1

## 2011-09-08 NOTE — ED Provider Notes (Addendum)
History     CSN: [id number]  Arrival date & time 09/08/11  1508   First MD Initiated Contact with Patient 09/08/11 1513      Chief Complaint  Patient presents with  . Abdominal Pain    (Consider location/radiation/quality/duration/timing/severity/associated sxs/prior treatment) Patient is a 56 y.o. male presenting with abdominal pain. The history is provided by the patient.  Abdominal Pain The primary symptoms of the illness include abdominal pain. The primary symptoms of the illness do not include fever, shortness of breath or dysuria.  Symptoms associated with the illness do not include hematuria or back pain.  pt c/o ruq and epigastric pain. States same pain present 'for years'. Denies specific dx, states was told possibly 'phantom pain' after prior evals from same, recurrent pain post cholecystectomy many years ago. Denies hx pud or pancreatitis. No other abd surgery. Normal appetite. Occasional nausea. No vomiting. No abd distension. Having normal bms including today. No gu c/o. No hematuria. No back or flank pain. No cough or uri c/o. No cp or sob. No pleuritic pain. No leg pain or swelling. No fever or chills. States was at [facility] last week w lower abd pain, was told diverticulitis.     Past Medical History  Diagnosis Date  . Automatic implantable cardiac defibrillator in situ     a. s/p MDT [facility]  . HTN (hypertension)   . HLD (hyperlipidemia) mixed  . Ischemic cardiomyopathy     a.  EF 30-35% 2010;  b.  EF 20-25% by LV gram 05/2011  . CAD (coronary artery disease)     a.  Severe LAD stenosis secondary to acute thrombus - BMS 2009;  b. 06/01/11 Cath - LM nl, LAD 20 isr, 64m, LCX 50p, OM1 100p, RCA 40ost, patent mid-stent, 30d, EF 20-25% - The LAD was treated with a 3.0x68mm Veri-flex BMS  . Anxiety   . Paroxysmal ventricular tachycardia     a. VFlutter  CL 210 msec  Rx shock 04/2011  . Systolic CHF, chronic   . Myocardial infarction     Past Surgical History    Procedure Date  . Laproscopic cholecystectomy   . Defibrillator implantation 04/12/02    primary prevention for sudden death; Dr. Graciela Husbands   . Cardiac stent  lad 06/01/2011  . Cardiac catheterization   . Cardiac defibrillator placement     Family History  Problem Relation Age of Onset  . Cancer      family hx    History  Substance Use Topics  . Smoking status: Former Smoker -- 1.0 packs/day for 40 years    Types: Cigarettes    Quit date: 05/27/2011  . Smokeless tobacco: Former Neurosurgeon  . Alcohol Use: Yes     occas      Review of Systems  Constitutional: Negative for fever.  HENT: Negative for neck pain.   Eyes: Negative for pain.  Respiratory: Negative for cough and shortness of breath.   Cardiovascular: Negative for chest pain and leg swelling.  Gastrointestinal: Positive for abdominal pain.  Genitourinary: Negative for dysuria, hematuria and flank pain.  Musculoskeletal: Negative for back pain.  Skin: Negative for rash.  Neurological: Negative for headaches.  Hematological: Does not bruise/bleed easily.  Psychiatric/Behavioral: Negative for confusion.    Allergies  Review of patient's allergies indicates no known allergies.  Home Medications   Current Outpatient Rx  Name Route Sig Dispense Refill  . ASPIRIN EC 81 MG PO TBEC Oral Take 81 mg by mouth daily.    [name]  CARVEDILOL 12.5 MG PO TABS Oral Take 12.5 mg by mouth 2 (two) times daily with a meal. Take one tablet by mouth twice daily    . CLOPIDOGREL BISULFATE 75 MG PO TABS Oral Take 75 mg by mouth daily with breakfast.    . FUROSEMIDE 20 MG PO TABS Oral Take 20 mg by mouth daily.    [name] LISINOPRIL 20 MG PO TABS Oral Take 20 mg by mouth daily.    [name] NICOTINE 21 MG/24HR TD PT24 Transdermal Place 1 patch onto the skin daily.    [name] NITROGLYCERIN 0.4 MG SL SUBL Sublingual Place 0.4 mg under the tongue every 5 (five) minutes as needed. For chest pain    . SIMVASTATIN 80 MG PO TABS Oral Take 80 mg by mouth at bedtime.       BP 121/68  Pulse 74  Temp 97.9 F (36.6 C) (Oral)  Resp 16  SpO2 99%  Physical Exam  Nursing note and vitals reviewed. Constitutional: He is oriented to person, place, and time. He appears well-developed and well-nourished. No distress.  HENT:  Head: Atraumatic.  Nose: Nose normal.  Mouth/Throat: Oropharynx is clear and moist.  Eyes: Pupils are equal, round, and reactive to light.  Neck: Neck supple. No tracheal deviation present.  Cardiovascular: Normal rate, regular rhythm, normal heart sounds and intact distal pulses.   Pulmonary/Chest: Effort normal and breath sounds normal. No accessory muscle usage. No respiratory distress. He exhibits no tenderness.  Abdominal: Soft. Bowel sounds are normal. He exhibits no distension and no mass. There is no tenderness. There is no rebound and no guarding.       No hernia  Genitourinary:       No cva tenderness  Musculoskeletal: Normal range of motion. He exhibits no edema and no tenderness.  Neurological: He is alert and oriented to person, place, and time.  Skin: Skin is warm and dry.  Psychiatric: He has a normal mood and affect.    ED Course  Procedures (including critical care time)   Results for orders placed during the hospital encounter of 09/08/11  CBC      Component Value Range   WBC 6.7  4.0 - 10.5 K/uL   RBC 4.28  4.22 - 5.81 MIL/uL   Hemoglobin 13.3  13.0 - 17.0 g/dL   HCT 56.2  13.0 - 86.5 %   MCV 91.6  78.0 - 100.0 fL   MCH 31.1  26.0 - 34.0 pg   MCHC 33.9  30.0 - 36.0 g/dL   RDW 78.4  69.6 - 29.5 %   Platelets 187  150 - 400 K/uL  COMPREHENSIVE METABOLIC PANEL      Component Value Range   Sodium 136  135 - 145 mEq/L   Potassium 4.4  3.5 - 5.1 mEq/L   Chloride 101  96 - 112 mEq/L   CO2 24  19 - 32 mEq/L   Glucose, Bld 94  70 - 99 mg/dL   BUN 24 (*) 6 - 23 mg/dL   Creatinine, Ser 2.84 (*) 0.50 - 1.35 mg/dL   Calcium 9.7  8.4 - 13.2 mg/dL   Total Protein 7.0  6.0 - 8.3 g/dL   Albumin 4.0  3.5 - 5.2 g/dL    AST 36  0 - 37 U/L   ALT 38  0 - 53 U/L   Alkaline Phosphatase 32 (*) 39 - 117 U/L   Total Bilirubin 0.4  0.3 - 1.2 mg/dL   GFR calc non Af  Amer 45 (*) >90 mL/min   GFR calc Af Amer 52 (*) >90 mL/min  LIPASE, BLOOD      Component Value Range   Lipase 39  11 - 59 U/L  URINALYSIS, ROUTINE W REFLEX MICROSCOPIC      Component Value Range   Color, Urine AMBER (*) YELLOW   APPearance CLOUDY (*) CLEAR   Specific Gravity, Urine 1.021  1.005 - 1.030   pH 6.0  5.0 - 8.0   Glucose, UA NEGATIVE  NEGATIVE mg/dL   Hgb urine dipstick NEGATIVE  NEGATIVE   Bilirubin Urine SMALL (*) NEGATIVE   Ketones, ur 15 (*) NEGATIVE mg/dL   Protein, ur 865 (*) NEGATIVE mg/dL   Urobilinogen, UA 0.2  0.0 - 1.0 mg/dL   Nitrite POSITIVE (*) NEGATIVE   Leukocytes, UA SMALL (*) NEGATIVE  URINE MICROSCOPIC-ADD ON      Component Value Range   Squamous Epithelial / LPF RARE  RARE   WBC, UA 3-6  <3 WBC/hpf   Bacteria, UA RARE  RARE   Casts HYALINE CASTS (*) NEGATIVE   Urine-Other MUCOUS PRESENT         MDM  Iv ns bolus. zofran iv. Dilaudid 1 mg iv. protonix iv.   Reviewed nursing notes and prior charts for additional history.   Reviewed scan report from [place], ct 7/12 - ?possible mild diverticulitis, pt on cipro and flagyl.   Given small ketones in urine, mild inc cr (from [place] labs cr 1.1) will give iv fluids.   Ns bolus.   Recheck abd soft nt.   Pt tolerating fluids well.  abd soft nt.   Close pcp f/u 1-2 days. Discussed renal fxn/cr w pt, pt noted on ace inh, lasix and asa. Will have temp hold ace inh, close pcp follow up.  Possible uti, urine culture sent, pt recently started on cipro, will f/u pcp in next couple days for recheck including f/u urine culture, recheck renal fxn, reassessment.   Also made pt aware of other incidental findings on recent ct scan and need for close pcp f/u.       Suzi Roots, MD 09/08/11 [id number]  Suzi Roots, MD 09/08/11 2108

## 2011-09-08 NOTE — ED Notes (Signed)
Consent for medical record release signed

## 2011-09-08 NOTE — ED Notes (Addendum)
Pt from home. Transported by [facility]. Pt was seen Friday at [facility] - dx diverticulosis.  Per EMS pt's BP was low when they arrived. BP returned to WNL after NS administration. Pt states he has had RUQ pain for years; gall bladder removed 6-8 years ago without pain relief. Pain became severe today with lightheadedness, dizziness, nausea. Pain=9/10.

## 2011-09-10 LAB — URINE CULTURE: Colony Count: NO GROWTH

## 2011-09-24 ENCOUNTER — Ambulatory Visit (INDEPENDENT_AMBULATORY_CARE_PROVIDER_SITE_OTHER): Payer: Self-pay | Admitting: *Deleted

## 2011-09-24 ENCOUNTER — Encounter: Payer: Self-pay | Admitting: Internal Medicine

## 2011-09-24 DIAGNOSIS — I2589 Other forms of chronic ischemic heart disease: Secondary | ICD-10-CM

## 2011-09-24 DIAGNOSIS — I255 Ischemic cardiomyopathy: Secondary | ICD-10-CM

## 2011-09-29 ENCOUNTER — Encounter: Payer: Self-pay | Admitting: *Deleted

## 2011-09-29 LAB — REMOTE ICD DEVICE
BRDY-0002RV: 40 {beats}/min
CHARGE TIME: 8.688 s
FVT: 0
RV LEAD AMPLITUDE: 8.5 mv
RV LEAD IMPEDENCE ICD: 475 Ohm
TOT-0002: 0
TZAT-0001FASTVT: 1
TZAT-0001SLOWVT: 1
TZAT-0002FASTVT: NEGATIVE
TZAT-0002SLOWVT: NEGATIVE
TZAT-0019FASTVT: 8 V
TZON-0004SLOWVT: 16
TZON-0005SLOWVT: 12
TZST-0001FASTVT: 3
TZST-0001FASTVT: 4
TZST-0001FASTVT: 5
TZST-0001SLOWVT: 5
TZST-0002FASTVT: NEGATIVE
TZST-0002FASTVT: NEGATIVE
TZST-0002FASTVT: NEGATIVE
TZST-0002FASTVT: NEGATIVE
TZST-0002SLOWVT: NEGATIVE
TZST-0002SLOWVT: NEGATIVE
TZST-0002SLOWVT: NEGATIVE
VF: 0

## 2011-10-21 ENCOUNTER — Encounter: Payer: Self-pay | Admitting: *Deleted

## 2011-11-24 ENCOUNTER — Encounter: Payer: Self-pay | Admitting: Internal Medicine

## 2011-12-28 ENCOUNTER — Encounter: Payer: Self-pay | Admitting: *Deleted

## 2012-01-05 ENCOUNTER — Encounter: Payer: Self-pay | Admitting: *Deleted

## 2012-01-12 ENCOUNTER — Ambulatory Visit (INDEPENDENT_AMBULATORY_CARE_PROVIDER_SITE_OTHER): Payer: Self-pay | Admitting: *Deleted

## 2012-01-12 ENCOUNTER — Encounter: Payer: Self-pay | Admitting: Internal Medicine

## 2012-01-12 DIAGNOSIS — I255 Ischemic cardiomyopathy: Secondary | ICD-10-CM

## 2012-01-12 DIAGNOSIS — Z95810 Presence of automatic (implantable) cardiac defibrillator: Secondary | ICD-10-CM

## 2012-01-12 DIAGNOSIS — I2589 Other forms of chronic ischemic heart disease: Secondary | ICD-10-CM

## 2012-01-19 LAB — REMOTE ICD DEVICE
DEV-0020ICD: NEGATIVE
FVT: 0
PACEART VT: 0
RV LEAD IMPEDENCE ICD: 437 Ohm
TOT-0001: 3
TOT-0002: 0
TZAT-0001SLOWVT: 1
TZAT-0002FASTVT: NEGATIVE
TZAT-0002SLOWVT: NEGATIVE
TZAT-0012SLOWVT: 200 ms
TZAT-0018FASTVT: NEGATIVE
TZAT-0018SLOWVT: NEGATIVE
TZAT-0019FASTVT: 8 V
TZAT-0020FASTVT: 1.5 ms
TZST-0001FASTVT: 5
TZST-0001FASTVT: 6
TZST-0001SLOWVT: 4
TZST-0001SLOWVT: 5
TZST-0001SLOWVT: 6
TZST-0002FASTVT: NEGATIVE
TZST-0002FASTVT: NEGATIVE
TZST-0002FASTVT: NEGATIVE
TZST-0002SLOWVT: NEGATIVE
TZST-0002SLOWVT: NEGATIVE
VF: 0

## 2012-02-01 ENCOUNTER — Encounter: Payer: Self-pay | Admitting: *Deleted

## 2012-03-08 ENCOUNTER — Other Ambulatory Visit: Payer: Self-pay

## 2012-03-15 ENCOUNTER — Other Ambulatory Visit: Payer: Self-pay | Admitting: Cardiology

## 2012-04-18 ENCOUNTER — Encounter: Payer: Self-pay | Admitting: *Deleted

## 2012-05-02 ENCOUNTER — Encounter: Payer: Self-pay | Admitting: *Deleted

## 2012-05-04 ENCOUNTER — Other Ambulatory Visit: Payer: Self-pay | Admitting: *Deleted

## 2012-05-04 MED ORDER — FUROSEMIDE 20 MG PO TABS: 20.0000 mg | ORAL_TABLET | Freq: Every morning | ORAL | Status: DC

## 2012-05-06 ENCOUNTER — Ambulatory Visit (INDEPENDENT_AMBULATORY_CARE_PROVIDER_SITE_OTHER): Payer: Self-pay | Admitting: *Deleted

## 2012-05-06 DIAGNOSIS — I255 Ischemic cardiomyopathy: Secondary | ICD-10-CM

## 2012-05-06 DIAGNOSIS — Z95810 Presence of automatic (implantable) cardiac defibrillator: Secondary | ICD-10-CM

## 2012-05-07 ENCOUNTER — Encounter: Payer: Self-pay | Admitting: Internal Medicine

## 2012-05-07 ENCOUNTER — Other Ambulatory Visit: Payer: Self-pay

## 2012-05-08 LAB — CUP PACEART REMOTE DEVICE CHECK
Brady Statistic RV Percent Paced: 0.1 %
Date Time Interrogation Session: 20170124153446
Implantable Lead Implant Date: 20040218
Lead Channel Sensing Intrinsic Amplitude: 7.1 mV
Lead Channel Setting Pacing Pulse Width: 0.4 ms

## 2012-05-15 LAB — REMOTE ICD DEVICE
BATTERY VOLTAGE: 3.0707 V
DEV-0020ICD: NEGATIVE
PACEART VT: 0
TOT-0001: 3
TOT-0006: 20100811000000
TZAT-0012FASTVT: 200 ms
TZAT-0012SLOWVT: 200 ms
TZAT-0018FASTVT: NEGATIVE
TZAT-0018SLOWVT: NEGATIVE
TZAT-0019SLOWVT: 8 V
TZAT-0020FASTVT: 1.5 ms
TZAT-0020SLOWVT: 1.5 ms
TZON-0003SLOWVT: 360 ms
TZST-0001FASTVT: 2
TZST-0001FASTVT: 6
TZST-0001SLOWVT: 5
TZST-0002FASTVT: NEGATIVE
TZST-0002FASTVT: NEGATIVE
TZST-0002SLOWVT: NEGATIVE
TZST-0002SLOWVT: NEGATIVE
VENTRICULAR PACING ICD: 0.1 pct

## 2012-05-26 ENCOUNTER — Encounter: Payer: Self-pay | Admitting: *Deleted

## 2012-06-28 ENCOUNTER — Encounter: Payer: Self-pay | Admitting: Internal Medicine

## 2012-07-22 ENCOUNTER — Ambulatory Visit (INDEPENDENT_AMBULATORY_CARE_PROVIDER_SITE_OTHER): Payer: Self-pay | Admitting: Internal Medicine

## 2012-07-22 ENCOUNTER — Other Ambulatory Visit (HOSPITAL_COMMUNITY): Payer: Self-pay | Admitting: Nurse Practitioner

## 2012-07-22 ENCOUNTER — Encounter: Payer: Self-pay | Admitting: Internal Medicine

## 2012-07-22 VITALS — BP 118/68 | HR 80 | Ht 71.0 in | Wt 246.0 lb

## 2012-07-22 DIAGNOSIS — I200 Unstable angina: Secondary | ICD-10-CM

## 2012-07-22 DIAGNOSIS — Z95810 Presence of automatic (implantable) cardiac defibrillator: Secondary | ICD-10-CM

## 2012-07-22 DIAGNOSIS — I255 Ischemic cardiomyopathy: Secondary | ICD-10-CM

## 2012-07-22 DIAGNOSIS — I472 Ventricular tachycardia: Secondary | ICD-10-CM

## 2012-07-22 DIAGNOSIS — I2589 Other forms of chronic ischemic heart disease: Secondary | ICD-10-CM

## 2012-07-22 DIAGNOSIS — F17200 Nicotine dependence, unspecified, uncomplicated: Secondary | ICD-10-CM

## 2012-07-22 LAB — ICD DEVICE OBSERVATION
DEV-0020ICD: NEGATIVE
PACEART VT: 0
RV LEAD IMPEDENCE ICD: 437 Ohm
RV LEAD THRESHOLD: 1.5 V
RV LEAD THRESHOLD: 1.5 V
TOT-0001: 3
TOT-0002: 0
TOT-0006: 20100811000000
TZAT-0001FASTVT: 1
TZAT-0001SLOWVT: 1
TZAT-0002FASTVT: NEGATIVE
TZAT-0002SLOWVT: NEGATIVE
TZAT-0019FASTVT: 8 V
TZON-0003VSLOWVT: 450 ms
TZON-0004SLOWVT: 16
TZON-0004VSLOWVT: 32
TZON-0005SLOWVT: 12
TZST-0001FASTVT: 3
TZST-0001FASTVT: 4
TZST-0001FASTVT: 5
TZST-0001SLOWVT: 2
TZST-0001SLOWVT: 3
TZST-0001SLOWVT: 4
TZST-0002FASTVT: NEGATIVE
TZST-0002FASTVT: NEGATIVE
TZST-0002FASTVT: NEGATIVE
TZST-0002SLOWVT: NEGATIVE
TZST-0002SLOWVT: NEGATIVE
TZST-0002SLOWVT: NEGATIVE

## 2012-07-22 NOTE — Assessment & Plan Note (Signed)
He is having crescendo chest discomfort. It is distinct from his prior angina we will undertake a Myoview scan initially. I would have a very low threshold for proceeding with catheterization. We'll also empirically begin him on a PPI.

## 2012-07-22 NOTE — Assessment & Plan Note (Signed)
The patient's device was interrogated.  The information was reviewed. No changes were made in the programming.    a meal at

## 2012-07-22 NOTE — Assessment & Plan Note (Signed)
HE stopped smoking

## 2012-07-22 NOTE — Patient Instructions (Addendum)
Your physician wants you to follow-up in: 12 months with Dr Logan Bores will receive a reminder letter in the mail two months in advance. If you don't receive a letter, please call our office to schedule the follow-up appointment.  Remote monitoring is used to monitor your Pacemaker of ICD from home. This monitoring reduces the number of office visits required to check your device to one time per year. It allows [place] to keep an eye on the functioning of your device to ensure it is working properly. You are scheduled for a device check from home on 10/25/12. You may send your transmission at any time that day. If you have a wireless device, the transmission will be sent automatically. After your physician reviews your transmission, you will receive a postcard with your next transmission date.    Your physician has requested that you have a lexiscan myoview. For further information please visit [email or web address] Please follow instruction sheet, as given.    Go get an over the counter Prilosec

## 2012-07-22 NOTE — Progress Notes (Signed)
Patient Care Team: [name], MD as PCP - Cardiology (Cardiology)   HPI  [name] is a 57 y.o. male is seen in followup for ICD implanted as per the MASTER trial with prior myocardial infarction complicated by shock and prior stenting.  Underwent catheterization in 2010 demonstrated ejection fraction of 35% and severe LAD stenosis secondary to acute thrombus.   Because of ventricular tachycardia and exertional chest pain shortness of breath he underwent catheterization April 2012 demonstrating a new lesion in the mid LAD that was felt to be critical;  proximal mid LAD stents and mid RCA stents were patent. LVEF was 20-25%    Left heart cath 4/13 >>There is a long stented segment in the proximal to mid LAD is patent. There is diffuse 10-20% narrowing within the stent. The recently placed stent in the mid LAD is widely patent. It jails the ostium of the second diagonal branch with a 90% lesion. Theres is TIMI-3 in the ostium of the D2.  Left circumflex (LCx): A left circumflex is a large vessel. There is a 50-60% stenosis in the proximal vessel. He first obtuse marginal vessel is very small in caliber and is sub-totally occluded proximally. The second and third marginal branches are large vessels. There is 30% lesion in ostium of OM-2  Right coronary artery (RCA): The right coronary has an inferior take off. It is a codominant vessel. There is 40% narrowing at the ostium. A stent is noted in the mid right coronary and is widely patent with less than 10-20% irregularities. There is a 30-40% lesion in the distal right coronary. Stable CAD as described above. The D2 is jailed by the recently placed LAD stent but there is good flow  And he is having discomfort with stress as well as intermittently with exertion he describes it as a burning lead was epigastrium into his chest shoulder neck area. It is distinct from his prior coronary pain.  Past Medical History  Diagnosis Date  . Automatic  implantable cardiac defibrillator in situ     a. s/p MDT [facility]  . HTN (hypertension)   . HLD (hyperlipidemia) mixed  . Ischemic cardiomyopathy     a.  EF 30-35% 2010;  b.  EF 20-25% by LV gram 05/2011  . CAD (coronary artery disease)     a.  Severe LAD stenosis secondary to acute thrombus - BMS 2009;  b. 06/01/11 Cath - LM nl, LAD 20 isr, 2m, LCX 50p, OM1 100p, RCA 40ost, patent mid-stent, 30d, EF 20-25% - The LAD was treated with a 3.0x17mm Veri-flex BMS  . Anxiety   . Paroxysmal ventricular tachycardia     a. VFlutter  CL 210 msec  Rx shock 04/2011  . Systolic CHF, chronic   . Myocardial infarction     Past Surgical History  Procedure Laterality Date  . Laproscopic cholecystectomy    . Defibrillator implantation  04/12/02    primary prevention for sudden death; Dr. Graciela Husbands   . Cardiac stent  lad  06/01/2011  . Cardiac catheterization    . Cardiac defibrillator placement      Current Outpatient Prescriptions  Medication Sig Dispense Refill  . aspirin EC 81 MG tablet Take 81 mg by mouth daily.      . carvedilol (COREG) 12.5 MG tablet Take 12.5 mg by mouth 2 (two) times daily with a meal. Take one tablet by mouth twice daily      . clopidogrel (PLAVIX) 75 MG tablet Take 75 mg  by mouth at bedtime.       . furosemide (LASIX) 20 MG tablet Take 1 tablet (20 mg total) by mouth every morning.  30 tablet  3  . lisinopril (PRINIVIL,ZESTRIL) 20 MG tablet TAKE ONE TABLET BY MOUTH EVERY DAY  30 tablet  11  . nitroGLYCERIN (NITROSTAT) 0.4 MG SL tablet Place 0.4 mg under the tongue every 5 (five) minutes as needed. For chest pain      . simvastatin (ZOCOR) 80 MG tablet Take 80 mg by mouth at bedtime.       No current facility-administered medications for this visit.    No Known Allergies  Review of Systems negative except from HPI and PMH  Physical Exam BP 118/68  Pulse 80  Ht 5\' 11"  (1.803 m)  Wt 246 lb (111.585 kg)  BMI 34.33 kg/m2 Well developed and nourished in no acute  distress HENT normal Neck supple with JVP-flat Clear Regular rate and rhythm, no murmurs or gallops Abd-soft with active BS No Clubbing cyanosis edema Skin-warm and dry A & Oriented  Grossly normal sensory and motor function     Assessment and  Plan

## 2012-07-22 NOTE — Assessment & Plan Note (Signed)
No intercurrent Ventricular tachycardia  

## 2012-07-22 NOTE — Assessment & Plan Note (Signed)
Continue current medications for now.

## 2012-08-11 ENCOUNTER — Ambulatory Visit (HOSPITAL_COMMUNITY): Payer: Self-pay | Attending: Cardiology | Admitting: Radiology

## 2012-08-11 VITALS — BP 110/66 | Ht 71.0 in | Wt 242.0 lb

## 2012-08-11 DIAGNOSIS — I2589 Other forms of chronic ischemic heart disease: Secondary | ICD-10-CM

## 2012-08-11 DIAGNOSIS — R079 Chest pain, unspecified: Secondary | ICD-10-CM | POA: Insufficient documentation

## 2012-08-11 DIAGNOSIS — R0602 Shortness of breath: Secondary | ICD-10-CM

## 2012-08-11 DIAGNOSIS — R5381 Other malaise: Secondary | ICD-10-CM | POA: Insufficient documentation

## 2012-08-11 DIAGNOSIS — R002 Palpitations: Secondary | ICD-10-CM | POA: Insufficient documentation

## 2012-08-11 DIAGNOSIS — R0989 Other specified symptoms and signs involving the circulatory and respiratory systems: Secondary | ICD-10-CM | POA: Insufficient documentation

## 2012-08-11 DIAGNOSIS — R0609 Other forms of dyspnea: Secondary | ICD-10-CM | POA: Insufficient documentation

## 2012-08-11 DIAGNOSIS — I2510 Atherosclerotic heart disease of native coronary artery without angina pectoris: Secondary | ICD-10-CM

## 2012-08-11 MED ORDER — TECHNETIUM TC 99M SESTAMIBI GENERIC - CARDIOLITE
11.0000 | Freq: Once | INTRAVENOUS | Status: AC | PRN
  Administered 2012-08-11: 11 via INTRAVENOUS

## 2012-08-11 MED ORDER — ADENOSINE (DIAGNOSTIC) 3 MG/ML IV SOLN
0.5600 mg/kg | Freq: Once | INTRAVENOUS | Status: AC
  Administered 2012-08-11: 61.5 mg via INTRAVENOUS

## 2012-08-11 MED ORDER — TECHNETIUM TC 99M SESTAMIBI GENERIC - CARDIOLITE
33.0000 | Freq: Once | INTRAVENOUS | Status: AC | PRN
  Administered 2012-08-11: 33 via INTRAVENOUS

## 2012-08-11 NOTE — Progress Notes (Signed)
[facility] SITE 3 NUCLEAR MED [address] [place], [place] [address] [phone]    Cardiology Nuclear Med Study  [name] is a 57 y.o. male     MRN : [id number]     DOB: March 14, 1955  Procedure Date: 08/11/2012  Nuclear Med Background Indication for Stress Test:  Evaluation for Ischemia History:  2013 Stent- LAD, EF 20-25%, 2009 Echo EF 20-25%, 2007 MPS No ischemia, 2004 ICD Cardiac Risk Factors: History of Smoking, Hypertension and Lipids  Symptoms:  Chest Pain, DOE, Fatigue and Palpitations   Nuclear Pre-Procedure Caffeine/Decaff Intake:  None NPO After: 6 pm   Lungs:  clear O2 Sat: 97% on room air. IV 0.9% NS with Angio Cath:  22g  IV Site: R Hand  IV Started by:  Bonnita Levan, RN  Chest Size (in):  46 Cup Size: n/a  Height: 5\' 11"  (1.803 m)  Weight:  242 lb (109.77 kg)  BMI:  Body mass index is 33.77 kg/(m^2). Tech Comments:  N/A    Nuclear Med Study 1 or 2 day study: 1 day  Stress Test Type:  Lexiscan  Reading MD: Cassell Clement, MD  Order Authorizing Provider:  Berton Mount, MD  Resting Radionuclide: Technetium 40m Sestamibi  Resting Radionuclide Dose: 11.0 mCi   Stress Radionuclide:  Technetium 77m Sestamibi  Stress Radionuclide Dose: 33.0 mCi           Stress Protocol Rest HR: 69 Stress HR: 87  Rest BP: 110/66 Stress BP: 109/61  Exercise Time (min): n/a METS: n/a   Predicted Max HR: 161 bpm % Max HR: 54.04 bpm Rate Pressure Product: 9483   Dose of Adenosine (mg):  60 mg Dose of Lexiscan: n/a mg  Dose of Atropine (mg): n/a Dose of Dobutamine: n/a mcg/kg/min (at max HR)  Stress Test Technologist: Bonnita Levan, RN  Nuclear Technologist:  Doyne Keel, CNMT     Rest Procedure:  Myocardial perfusion imaging was performed at rest 45 minutes following the intravenous administration of Technetium 81m Sestamibi. Rest ECG: NSR with LBBB  Stress Procedure:  The patient received IV adenosine at 140 mcg/kg/min for 4-minutes with concurrent  submaximal exercise (1.31mph, 0% grade).  Technetium 85m Sestamibi was injected at the 2-minute mark and quantitative spect images were obtained. Stress ECG: No significant change from baseline ECG  QPS Raw Data Images:  Normal; no motion artifact; normal heart/lung ratio. Stress Images:  There is decreased uptake in the apex and mid and distal anterior wall. Rest Images:  There is decreased uptake in the apex and mid and distal anterior wall. Subtraction (SDS):  No evidence of ischemia. Transient Ischemic Dilatation (Normal <1.22):  1.04 Lung/Heart Ratio (Normal <0.45):  0.43  Quantitative Gated Spect Images QGS EDV:  350 ml QGS ESV:  258 ml  Impression Exercise Capacity:  Adenosine study with no exercise. BP Response:  Normal blood pressure response. Clinical Symptoms:  Mild chest pain/dyspnea. ECG Impression:  Baseline:  LBBB.  EKG uninterpretable due to LBBB at rest and stress. Comparison with Prior Nuclear Study: No significant change from previous study  Overall Impression:  High risk stress nuclear study. There is a large fixed defect involving apex, apical anterior and mid-anterior segments and apical septal segments. There is a small fixed defect involving the basalinferolateral and midinferolateral segments.  No significant reversibility. There is severe LV dysfunction with marked LV enlargement and apical akinesis. EF is 26%  LV Ejection Fraction: 26%.  LV Wall Motion:  Severe LV systolic dysfunction  with global hypokinesis and apical akinesis.   Cassell Clement

## 2012-08-19 ENCOUNTER — Other Ambulatory Visit: Payer: Self-pay | Admitting: Internal Medicine

## 2012-08-19 DIAGNOSIS — R9439 Abnormal result of other cardiovascular function study: Secondary | ICD-10-CM

## 2012-08-22 ENCOUNTER — Encounter: Payer: Self-pay | Admitting: *Deleted

## 2012-08-22 ENCOUNTER — Telehealth: Payer: Self-pay | Admitting: Internal Medicine

## 2012-08-22 ENCOUNTER — Other Ambulatory Visit: Payer: Self-pay | Admitting: Internal Medicine

## 2012-08-22 ENCOUNTER — Other Ambulatory Visit (INDEPENDENT_AMBULATORY_CARE_PROVIDER_SITE_OTHER): Payer: Self-pay

## 2012-08-22 DIAGNOSIS — R9430 Abnormal result of cardiovascular function study, unspecified: Secondary | ICD-10-CM

## 2012-08-22 NOTE — Addendum Note (Signed)
Addended by: Freddi Starr on: 08/22/2012 04:20 PM   Modules accepted: Orders

## 2012-08-22 NOTE — Telephone Encounter (Signed)
Spoke with pt, he was told by dr Graciela Husbands he would be called today to get a cath tomorrow. Will try to schedule and call the pt back.

## 2012-08-22 NOTE — Telephone Encounter (Signed)
Spoke with pt, he is on his way to have the lab work done.

## 2012-08-22 NOTE — Telephone Encounter (Signed)
New Prob      Pt has some questions regarding a cath he supposed to have done. Please call.

## 2012-08-22 NOTE — Telephone Encounter (Signed)
Left message for pt to call.

## 2012-08-23 ENCOUNTER — Encounter (HOSPITAL_BASED_OUTPATIENT_CLINIC_OR_DEPARTMENT_OTHER)
Admission: RE | Disposition: A | Discharge status: Home / Self Care | Payer: Self-pay | Source: Ambulatory Visit | Attending: Cardiology

## 2012-08-23 ENCOUNTER — Encounter (HOSPITAL_BASED_OUTPATIENT_CLINIC_OR_DEPARTMENT_OTHER): Payer: Self-pay

## 2012-08-23 ENCOUNTER — Ambulatory Visit: Payer: Self-pay

## 2012-08-23 ENCOUNTER — Inpatient Hospital Stay (HOSPITAL_BASED_OUTPATIENT_CLINIC_OR_DEPARTMENT_OTHER)
Admission: RE | Admit: 2012-08-23 | Discharge: 2012-08-23 | Disposition: A | Discharge status: Home / Self Care | Payer: Self-pay | Source: Ambulatory Visit | Attending: Cardiology | Admitting: Cardiology

## 2012-08-23 ENCOUNTER — Other Ambulatory Visit: Payer: Self-pay | Admitting: Cardiology

## 2012-08-23 DIAGNOSIS — I447 Left bundle-branch block, unspecified: Secondary | ICD-10-CM | POA: Insufficient documentation

## 2012-08-23 DIAGNOSIS — Z95810 Presence of automatic (implantable) cardiac defibrillator: Secondary | ICD-10-CM | POA: Insufficient documentation

## 2012-08-23 DIAGNOSIS — R079 Chest pain, unspecified: Secondary | ICD-10-CM | POA: Insufficient documentation

## 2012-08-23 DIAGNOSIS — I2510 Atherosclerotic heart disease of native coronary artery without angina pectoris: Secondary | ICD-10-CM

## 2012-08-23 DIAGNOSIS — R9430 Abnormal result of cardiovascular function study, unspecified: Secondary | ICD-10-CM

## 2012-08-23 DIAGNOSIS — I2589 Other forms of chronic ischemic heart disease: Secondary | ICD-10-CM | POA: Insufficient documentation

## 2012-08-23 DIAGNOSIS — I209 Angina pectoris, unspecified: Secondary | ICD-10-CM

## 2012-08-23 DIAGNOSIS — R0609 Other forms of dyspnea: Secondary | ICD-10-CM | POA: Insufficient documentation

## 2012-08-23 DIAGNOSIS — R9439 Abnormal result of other cardiovascular function study: Secondary | ICD-10-CM | POA: Insufficient documentation

## 2012-08-23 DIAGNOSIS — R0989 Other specified symptoms and signs involving the circulatory and respiratory systems: Secondary | ICD-10-CM | POA: Insufficient documentation

## 2012-08-23 DIAGNOSIS — I10 Essential (primary) hypertension: Secondary | ICD-10-CM | POA: Insufficient documentation

## 2012-08-23 DIAGNOSIS — Z9861 Coronary angioplasty status: Secondary | ICD-10-CM | POA: Insufficient documentation

## 2012-08-23 DIAGNOSIS — I252 Old myocardial infarction: Secondary | ICD-10-CM | POA: Insufficient documentation

## 2012-08-23 LAB — BASIC METABOLIC PANEL
CO2: 24 mEq/L (ref 19–32)
Calcium: 9.3 mg/dL (ref 8.4–10.5)
Creatinine, Ser: 1.2 mg/dL (ref 0.4–1.5)
Sodium: 140 mEq/L (ref 135–145)

## 2012-08-23 LAB — CBC WITH DIFFERENTIAL/PLATELET
Basophils Absolute: 0 10*3/uL (ref 0.0–0.1)
Basophils Relative: 1 % (ref 0–1)
HCT: 40.8 % (ref 39.0–52.0)
Lymphocytes Relative: 35 % (ref 12–46)
MCHC: 35.5 g/dL (ref 30.0–36.0)
Monocytes Absolute: 0.6 10*3/uL (ref 0.1–1.0)
Neutro Abs: 2.8 10*3/uL (ref 1.7–7.7)
Neutrophils Relative %: 48 % (ref 43–77)
Platelets: 217 10*3/uL (ref 150–400)
RDW: 13.9 % (ref 11.5–15.5)
WBC: 5.7 10*3/uL (ref 4.0–10.5)

## 2012-08-23 LAB — PROTIME-INR
INR: 1.1 ratio — ABNORMAL HIGH (ref 0.8–1.0)
Prothrombin Time: 11.6 s (ref 10.2–12.4)

## 2012-08-23 LAB — POCT I-STAT, CHEM 8
Calcium, Ion: 1.28 mmol/L — ABNORMAL HIGH (ref 1.12–1.23)
Glucose, Bld: 85 mg/dL (ref 70–99)
HCT: 38 % — ABNORMAL LOW (ref 39.0–52.0)
Hemoglobin: 12.9 g/dL — ABNORMAL LOW (ref 13.0–17.0)
TCO2: 22 mmol/L (ref 0–100)

## 2012-08-23 SURGERY — JV LEFT HEART CATHETERIZATION WITH CORONARY ANGIOGRAM
Anesthesia: Moderate Sedation

## 2012-08-23 MED ORDER — DIAZEPAM 5 MG PO TABS
5.0000 mg | ORAL_TABLET | ORAL | Status: AC
  Administered 2012-08-23: 5 mg via ORAL

## 2012-08-23 MED ORDER — SODIUM CHLORIDE 0.9 % IV SOLN
INTRAVENOUS | Status: DC
  Administered 2012-08-23: 12:00:00 via INTRAVENOUS

## 2012-08-23 MED ORDER — SODIUM CHLORIDE 0.9 % IV SOLN: 1.0000 mL/kg/h | INTRAVENOUS | Status: DC

## 2012-08-23 MED ORDER — ONDANSETRON HCL 4 MG/2ML IJ SOLN: 4.0000 mg | Freq: Four times a day (QID) | INTRAMUSCULAR | Status: DC | PRN

## 2012-08-23 MED ORDER — ACETAMINOPHEN 325 MG PO TABS: 650.0000 mg | ORAL_TABLET | ORAL | Status: DC | PRN

## 2012-08-23 NOTE — Progress Notes (Signed)
Allen's test performed on right hand with positive results. 

## 2012-08-23 NOTE — H&P (Signed)
HPI   Laken R Sowles is a 57 y.o. male is seen in followup for ICD implanted as per the MASTER trial with prior myocardial infarction complicated by shock and prior stenting.   Underwent catheterization in 2010 demonstrated ejection fraction of 35% and severe LAD stenosis secondary to acute thrombus.    Because of ventricular tachycardia and exertional chest pain shortness of breath he underwent catheterization April 2012 demonstrating a new lesion in the mid LAD that was felt to be critical;  proximal mid LAD stents and mid RCA stents were patent. LVEF was 20-25%     Left heart cath 4/13 >>There is a long stented segment in the proximal to mid LAD is patent. There is diffuse 10-20% narrowing within the stent. The recently placed stent in the mid LAD is widely patent. It jails the ostium of the second diagonal branch with a 90% lesion. Theres is TIMI-3 in the ostium of the D2.   Left circumflex (LCx): A left circumflex is a large vessel. There is a 50-60% stenosis in the proximal vessel. He first obtuse marginal vessel is very small in caliber and is sub-totally occluded proximally. The second and third marginal branches are large vessels. There is 30% lesion in ostium of OM-2   Right coronary artery (RCA): The right coronary has an inferior take off. It is a codominant vessel. There is 40% narrowing at the ostium. A stent is noted in the mid right coronary and is widely patent with less than 10-20% irregularities. There is a 30-40% lesion in the distal right coronary. Stable CAD as described above. The D2 is jailed by the recently placed LAD stent but there is good flow   And he is having discomfort with stress as well as intermittently with exertion he describes it as a burning lead was epigastrium into his chest shoulder neck area. It is distinct from his prior coronary pain.    Past Medical History   Diagnosis  Date   .  Automatic implantable cardiac defibrillator in situ         a. s/p MDT  [facility]   .  HTN (hypertension)     .  HLD (hyperlipidemia)  mixed   .  Ischemic cardiomyopathy         a.  EF 30-35% 2010;  b.  EF 20-25% by LV gram 05/2011   .  CAD (coronary artery disease)         a.  Severe LAD stenosis secondary to acute thrombus - BMS 2009;  b. 06/01/11 Cath - LM nl, LAD 20 isr, 95m, LCX 50p, OM1 100p, RCA 40ost, patent mid-stent, 30d, EF 20-25% - The LAD was treated with a 3.0x16mm Veri-flex BMS   .  Anxiety     .  Paroxysmal ventricular tachycardia         a. VFlutter  CL 210 msec  Rx shock 04/2011   .  Systolic CHF, chronic     .  Myocardial infarction           Past Surgical History   Procedure  Laterality  Date   .  Laproscopic cholecystectomy       .  Defibrillator implantation    04/12/02       primary prevention for sudden death; Dr. Klein    .  Cardiac stent  lad    06/01/2011   .  Cardiac catheterization       .  Cardiac defibrillator placement               Current Outpatient Prescriptions   Medication  Sig  Dispense  Refill   .  aspirin EC 81 MG tablet  Take 81 mg by mouth daily.         .  carvedilol (COREG) 12.5 MG tablet  Take 12.5 mg by mouth 2 (two) times daily with a meal. Take one tablet by mouth twice daily         .  clopidogrel (PLAVIX) 75 MG tablet  Take 75 mg by mouth at bedtime.          .  furosemide (LASIX) 20 MG tablet  Take 1 tablet (20 mg total) by mouth every morning.   30 tablet   3   .  lisinopril (PRINIVIL,ZESTRIL) 20 MG tablet  TAKE ONE TABLET BY MOUTH EVERY DAY   30 tablet   11   .  nitroGLYCERIN (NITROSTAT) 0.4 MG SL tablet  Place 0.4 mg under the tongue every 5 (five) minutes as needed. For chest pain         .  simvastatin (ZOCOR) 80 MG tablet  Take 80 mg by mouth at bedtime.             No current facility-administered medications for this visit.        No Known Allergies   Review of Systems negative except from HPI and PMH   Physical Exam BP 118/68  Pulse 80  Ht 5' 11" (1.803 m)  Wt 246 lb (111.585 kg)   BMI 34.33 kg/m2 Well developed and nourished in no acute distress HENT normal Neck supple with JVP-flat Clear Regular rate and rhythm, no murmurs or gallops Abd-soft with active BS No Clubbing cyanosis edema Skin-warm and dry A & Oriented  Grossly normal sensory and motor function         Assessment and  Plan            Unstable angina - Steven C Klein, MD at 07/22/2012  3:06 PM    Status: Written Related Problem: Unstable angina           He is having crescendo chest discomfort. It is distinct from his prior angina we will undertake a Myoview scan initially. I would have a very low threshold for proceeding with catheterization. We'll also empirically begin him on a PPI.         Ischemic cardiomyopathy - Steven C Klein, MD at 07/22/2012  3:06 PM    Status: Written Related Problem: Ischemic cardiomyopathy           Continue current medications for now.         VENTRICULAR TACHYCARDIA - Steven C Klein, MD at 07/22/2012  3:07 PM    Status: Written Related Problem: VENTRICULAR TACHYCARDIA           No intercurrent Ventricular tachycardia            ICD-Medtronic - Steven C Klein, MD at 07/22/2012  3:07 PM    Status: Written Related Problem: ICD-Medtronic           The patient's device was interrogated.  The information was reviewed. No changes were made in the programming.     a meal at         TOBACCO ABUSE - Steven C Klein, MD at 07/22/2012  3:07 PM    Status: Written Related Problem: TOBACCO ABUSE           HE stopped smoking     [facility] SITE 3 NUCLEAR   MED  [address]  [address], [place] [address]  [phone]    Cardiology Nuclear Med Study  Sol R Finkbiner is a 57 y.o. male MRN : [id number] DOB: 12/15/1955  Procedure Date: 08/11/2012  Nuclear Med Background  Indication for Stress Test: Evaluation for Ischemia  History: 2013 Stent- LAD, EF 20-25%, 2009 Echo EF 20-25%, 2007 MPS No ischemia, 2004 ICD  Cardiac Risk Factors: History of  Smoking, Hypertension and Lipids  Symptoms: Chest Pain, DOE, Fatigue and Palpitations  Nuclear Pre-Procedure  Caffeine/Decaff Intake: None  NPO After: 6 pm   Lungs: clear  O2 Sat: 97% on room air.  IV 0.9% NS with Angio Cath: 22g   IV Site: R Hand  IV Started by: Jackie Smith, RN   Chest Size (in): 46  Cup Size: n/a   Height: 5' 11" (1.803 m)  Weight: 242 lb (109.77 kg)   BMI: Body mass index is 33.77 kg/(m^2).  Tech Comments: N/A   Nuclear Med Study  1 or 2 day study: 1 day  Stress Test Type: Lexiscan   Reading MD: Thomas Brackbill, MD  Order Authorizing Provider: Steve Klein, MD   Resting Radionuclide: Technetium 99m Sestamibi  Resting Radionuclide Dose: 11.0 mCi   Stress Radionuclide: Technetium 99m Sestamibi  Stress Radionuclide Dose: 33.0 mCi   Stress Protocol  Rest HR: 69  Stress HR: 87   Rest BP: 110/66  Stress BP: 109/61   Exercise Time (min): n/a  METS: n/a   Predicted Max HR: 161 bpm  % Max HR: 54.04 bpm  Rate Pressure Product: 9483  Dose of Adenosine (mg): 60 mg  Dose of Lexiscan: n/a mg   Dose of Atropine (mg): n/a  Dose of Dobutamine: n/a mcg/kg/min (at max HR)   Stress Test Technologist: Jackie Smith, RN  Nuclear Technologist: Tonya Yount, CNMT   Rest Procedure: Myocardial perfusion imaging was performed at rest 45 minutes following the intravenous administration of Technetium 99m Sestamibi.  Rest ECG: NSR with LBBB  Stress Procedure: The patient received IV adenosine at 140 mcg/kg/min for 4-minutes with concurrent submaximal exercise (1.0mph, 0% grade). Technetium 99m Sestamibi was injected at the 2-minute mark and quantitative spect images were obtained.  Stress ECG: No significant change from baseline ECG  QPS  Raw Data Images: Normal; no motion artifact; normal heart/lung ratio.  Stress Images: There is decreased uptake in the apex and mid and distal anterior wall.  Rest Images: There is decreased uptake in the apex and mid and distal anterior wall.  Subtraction  (SDS): No evidence of ischemia.  Transient Ischemic Dilatation (Normal <1.22): 1.04  Lung/Heart Ratio (Normal <0.45): 0.43  Quantitative Gated Spect Images  QGS EDV: 350 ml  QGS ESV: 258 ml  Impression  Exercise Capacity: Adenosine study with no exercise.  BP Response: Normal blood pressure response.  Clinical Symptoms: Mild chest pain/dyspnea.  ECG Impression: Baseline: LBBB. EKG uninterpretable due to LBBB at rest and stress.  Comparison with Prior Nuclear Study: No significant change from previous study  Overall Impression: High risk stress nuclear study. There is a large fixed defect involving apex, apical anterior and mid-anterior segments and apical septal segments. There is a small fixed defect involving the basalinferolateral and midinferolateral segments. No significant reversibility. There is severe LV dysfunction with marked LV enlargement and apical akinesis. EF is 26%  LV Ejection Fraction: 26%. LV Wall Motion: Severe LV systolic dysfunction with global hypokinesis and apical akinesis.  Thomas Brackbill    Patient with recent chest pain. Abnormal   myoview study. Will proceed with cardiac cath today.  Clell Trahan MD, FACC  08/23/2012 12:05 PM   

## 2012-08-23 NOTE — CV Procedure (Signed)
   Cardiac Catheterization Procedure Note  Name: [name] MRN: [id number] DOB: 24-Jul-1955  Procedure: Left Heart Cath, Selective Coronary Angiography, LV angiography  Indication: 57 yo WM with history of prior anterior MI and extensive stenting of the LAD and RCA presents with recurrent angina. Steffanie Dunn is a high risk study.   Procedural Details: The right wrist was prepped, draped, and anesthetized with 1% lidocaine. Using the modified Seldinger technique, a 5 French sheath was introduced into the right radial artery. 3 mg of verapamil was administered through the sheath, weight-based unfractionated heparin was administered intravenously. Standard Judkins catheters were used for selective coronary angiography and left ventriculography. Catheter exchanges were performed over an exchange length guidewire. There were no immediate procedural complications. A TR band was used for radial hemostasis at the completion of the procedure.  The patient was transferred to the post catheterization recovery area for further monitoring.  Procedural Findings: Hemodynamics: AO 108/66 mean 85 mm Hg LV 106/30 mm Hg.  Coronary angiography: Coronary dominance: right  Left mainstem: Normal  Left anterior descending (LAD): The LAD has extensive stents from the proximal to mid vessel. These stents are widely patent. The second diagonal is jailed with 80-90% ostial stenosis.  Left circumflex (LCx): The circumflex is a very large vessel. There is severe disease in the proximal and mid vessel with tandem 90% stenoses. The first OM is small and occluded. The second OM is a large branch and has 80-90% stenosis at the takeoff and proximal vessel that is involved with the main circumflex lesion.   Right coronary artery (RCA): The RCA arises inferiorly. The stent in the mid vessel is widely patent. There is 40% disease at the crux.  Left ventriculography: Left ventricular systolic function is abnormal. There  is extensive akinesis involving the mid to distal anterior wall, distal inferior wall, and apex. Overall LV function is severely reduced with EF of 25%.  Final Conclusions:   1. Severe single vessel obstructive CAD with complex bifurcation LCX/OM2 disease. The stents in the LAD and RCA are patent. Chronic jailing of the second diagonal. 2. Severe LV dysfunction.  Recommendations: Will consider the patient for high risk PCI of the LCX/ OM disease.  [name] [facility] 08/23/2012, 12:44 PM

## 2012-08-23 NOTE — Interval H&P Note (Signed)
History and Physical Interval Note:  08/23/2012 12:07 PM  [name]  has presented today for surgery, with the diagnosis of chest pain  The various methods of treatment have been discussed with the patient and family. After consideration of risks, benefits and other options for treatment, the patient has consented to  Procedure(s): JV LEFT HEART CATHETERIZATION WITH CORONARY ANGIOGRAM (N/A) as a surgical intervention .  The patient's history has been reviewed, patient examined, no change in status, stable for surgery.  I have reviewed the patient's chart and labs.  Questions were answered to the patient's satisfaction.   Cath Lab Visit (complete for each Cath Lab visit)  Clinical Evaluation Leading to the Procedure:   ACS: no  Non-ACS:    Anginal Classification: CCS III  Anti-ischemic medical therapy: Minimal Therapy (1 class of medications)  Non-Invasive Test Results: High-risk stress test findings: cardiac mortality >3%/year  Prior CABG: No previous CABG        [name] [facility] 08/23/2012 12:07 PM

## 2012-08-25 ENCOUNTER — Other Ambulatory Visit: Payer: Self-pay | Admitting: *Deleted

## 2012-08-25 MED ORDER — NITROGLYCERIN 0.4 MG SL SUBL: 0.4000 mg | SUBLINGUAL_TABLET | SUBLINGUAL | Status: DC | PRN

## 2012-08-29 ENCOUNTER — Encounter (HOSPITAL_COMMUNITY)
Admission: RE | Disposition: A | Discharge status: Home / Self Care | Payer: Self-pay | Source: Ambulatory Visit | Attending: Cardiology

## 2012-08-29 ENCOUNTER — Encounter (HOSPITAL_COMMUNITY): Payer: Self-pay | Admitting: General Practice

## 2012-08-29 ENCOUNTER — Ambulatory Visit (HOSPITAL_COMMUNITY)
Admission: RE | Admit: 2012-08-29 | Discharge: 2012-08-30 | Disposition: A | Discharge status: Home / Self Care | Payer: MEDICAID | Source: Ambulatory Visit | Attending: Cardiology | Admitting: Cardiology

## 2012-08-29 ENCOUNTER — Other Ambulatory Visit: Payer: Self-pay

## 2012-08-29 DIAGNOSIS — I2510 Atherosclerotic heart disease of native coronary artery without angina pectoris: Secondary | ICD-10-CM | POA: Insufficient documentation

## 2012-08-29 DIAGNOSIS — I200 Unstable angina: Secondary | ICD-10-CM | POA: Insufficient documentation

## 2012-08-29 DIAGNOSIS — I5022 Chronic systolic (congestive) heart failure: Secondary | ICD-10-CM | POA: Insufficient documentation

## 2012-08-29 DIAGNOSIS — Z95810 Presence of automatic (implantable) cardiac defibrillator: Secondary | ICD-10-CM | POA: Insufficient documentation

## 2012-08-29 DIAGNOSIS — Z9861 Coronary angioplasty status: Secondary | ICD-10-CM | POA: Insufficient documentation

## 2012-08-29 DIAGNOSIS — I209 Angina pectoris, unspecified: Secondary | ICD-10-CM

## 2012-08-29 DIAGNOSIS — E782 Mixed hyperlipidemia: Secondary | ICD-10-CM | POA: Insufficient documentation

## 2012-08-29 DIAGNOSIS — I2589 Other forms of chronic ischemic heart disease: Secondary | ICD-10-CM | POA: Insufficient documentation

## 2012-08-29 DIAGNOSIS — I255 Ischemic cardiomyopathy: Secondary | ICD-10-CM | POA: Diagnosis present

## 2012-08-29 DIAGNOSIS — Z87891 Personal history of nicotine dependence: Secondary | ICD-10-CM | POA: Insufficient documentation

## 2012-08-29 DIAGNOSIS — I10 Essential (primary) hypertension: Secondary | ICD-10-CM | POA: Insufficient documentation

## 2012-08-29 DIAGNOSIS — Z79899 Other long term (current) drug therapy: Secondary | ICD-10-CM | POA: Insufficient documentation

## 2012-08-29 DIAGNOSIS — I472 Ventricular tachycardia: Secondary | ICD-10-CM

## 2012-08-29 DIAGNOSIS — E785 Hyperlipidemia, unspecified: Secondary | ICD-10-CM | POA: Diagnosis present

## 2012-08-29 DIAGNOSIS — I509 Heart failure, unspecified: Secondary | ICD-10-CM | POA: Insufficient documentation

## 2012-08-29 DIAGNOSIS — Z7902 Long term (current) use of antithrombotics/antiplatelets: Secondary | ICD-10-CM | POA: Insufficient documentation

## 2012-08-29 DIAGNOSIS — I252 Old myocardial infarction: Secondary | ICD-10-CM | POA: Insufficient documentation

## 2012-08-29 HISTORY — DX: Personal history of other diseases of the circulatory system: Z86.79

## 2012-08-29 HISTORY — PX: PERCUTANEOUS CORONARY STENT INTERVENTION (PCI-S): SHX5485

## 2012-08-29 HISTORY — DX: Presence of automatic (implantable) cardiac defibrillator: Z95.810

## 2012-08-29 LAB — CBC
HCT: 42.5 % (ref 39.0–52.0)
MCHC: 33.9 g/dL (ref 30.0–36.0)
MCV: 90.2 fL (ref 78.0–100.0)
RDW: 13.5 % (ref 11.5–15.5)

## 2012-08-29 LAB — BASIC METABOLIC PANEL
BUN: 14 mg/dL (ref 6–23)
CO2: 23 mEq/L (ref 19–32)
Chloride: 103 mEq/L (ref 96–112)
Creatinine, Ser: 0.97 mg/dL (ref 0.50–1.35)

## 2012-08-29 SURGERY — PERCUTANEOUS CORONARY STENT INTERVENTION (PCI-S)
Anesthesia: LOCAL

## 2012-08-29 MED ORDER — HEPARIN (PORCINE) IN NACL 2-0.9 UNIT/ML-% IJ SOLN
INTRAMUSCULAR | Status: AC
  Filled 2012-08-29: qty 1000

## 2012-08-29 MED ORDER — SODIUM CHLORIDE 0.9 % IV SOLN
0.2500 mg/kg/h | INTRAVENOUS | Status: DC
  Filled 2012-08-29: qty 250

## 2012-08-29 MED ORDER — ATORVASTATIN CALCIUM 40 MG PO TABS
40.0000 mg | ORAL_TABLET | Freq: Every day | ORAL | Status: DC
  Administered 2012-08-29: 40 mg via ORAL
  Filled 2012-08-29 (×2): qty 1

## 2012-08-29 MED ORDER — LIDOCAINE HCL (PF) 1 % IJ SOLN
INTRAMUSCULAR | Status: AC
  Filled 2012-08-29: qty 30

## 2012-08-29 MED ORDER — CLOPIDOGREL BISULFATE 75 MG PO TABS
75.0000 mg | ORAL_TABLET | Freq: Every day | ORAL | Status: DC
  Administered 2012-08-30: 75 mg via ORAL
  Filled 2012-08-29: qty 1

## 2012-08-29 MED ORDER — ONDANSETRON HCL 4 MG/2ML IJ SOLN: 4.0000 mg | Freq: Four times a day (QID) | INTRAMUSCULAR | Status: DC | PRN

## 2012-08-29 MED ORDER — SODIUM CHLORIDE 0.9 % IV SOLN
INTRAVENOUS | Status: DC
  Administered 2012-08-29: 07:00:00 via INTRAVENOUS

## 2012-08-29 MED ORDER — NITROGLYCERIN 0.4 MG SL SUBL: 0.4000 mg | SUBLINGUAL_TABLET | SUBLINGUAL | Status: DC | PRN

## 2012-08-29 MED ORDER — CARVEDILOL 12.5 MG PO TABS
12.5000 mg | ORAL_TABLET | Freq: Two times a day (BID) | ORAL | Status: DC
  Administered 2012-08-29 – 2012-08-30 (×2): 12.5 mg via ORAL
  Filled 2012-08-29 (×4): qty 1

## 2012-08-29 MED ORDER — SODIUM CHLORIDE 0.9 % IV SOLN: 250.0000 mL | INTRAVENOUS | Status: DC | PRN

## 2012-08-29 MED ORDER — BIVALIRUDIN 250 MG IV SOLR
INTRAVENOUS | Status: AC
  Filled 2012-08-29: qty 250

## 2012-08-29 MED ORDER — CLOPIDOGREL BISULFATE 75 MG PO TABS
75.0000 mg | ORAL_TABLET | ORAL | Status: AC
  Administered 2012-08-29: 75 mg via ORAL
  Filled 2012-08-29: qty 1

## 2012-08-29 MED ORDER — FENTANYL CITRATE 0.05 MG/ML IJ SOLN
INTRAMUSCULAR | Status: AC
  Filled 2012-08-29: qty 2

## 2012-08-29 MED ORDER — SODIUM CHLORIDE 0.9 % IJ SOLN: 3.0000 mL | INTRAMUSCULAR | Status: DC | PRN

## 2012-08-29 MED ORDER — DIAZEPAM 5 MG PO TABS
ORAL_TABLET | ORAL | Status: AC
  Administered 2012-08-29: 5 mg via ORAL
  Filled 2012-08-29: qty 1

## 2012-08-29 MED ORDER — SODIUM CHLORIDE 0.9 % IV SOLN
1.0000 mL/kg/h | INTRAVENOUS | Status: AC
  Administered 2012-08-29: 1 mL/kg/h via INTRAVENOUS

## 2012-08-29 MED ORDER — SODIUM CHLORIDE 0.9 % IJ SOLN: 3.0000 mL | Freq: Two times a day (BID) | INTRAMUSCULAR | Status: DC

## 2012-08-29 MED ORDER — MIDAZOLAM HCL 2 MG/2ML IJ SOLN
INTRAMUSCULAR | Status: AC
  Filled 2012-08-29: qty 2

## 2012-08-29 MED ORDER — MORPHINE SULFATE 2 MG/ML IJ SOLN
2.0000 mg | Freq: Once | INTRAMUSCULAR | Status: AC
  Administered 2012-08-29: 12:00:00 4 mg via INTRAVENOUS
  Filled 2012-08-29: qty 2

## 2012-08-29 MED ORDER — ACETAMINOPHEN 325 MG PO TABS: 650.0000 mg | ORAL_TABLET | ORAL | Status: DC | PRN

## 2012-08-29 MED ORDER — DIAZEPAM 5 MG PO TABS: 5.0000 mg | ORAL_TABLET | ORAL | Status: AC

## 2012-08-29 MED ORDER — ASPIRIN EC 81 MG PO TBEC
81.0000 mg | DELAYED_RELEASE_TABLET | Freq: Every day | ORAL | Status: DC
  Filled 2012-08-29 (×2): qty 1

## 2012-08-29 MED ORDER — FUROSEMIDE 20 MG PO TABS
20.0000 mg | ORAL_TABLET | Freq: Every morning | ORAL | Status: DC
  Filled 2012-08-29 (×3): qty 1

## 2012-08-29 MED ORDER — LISINOPRIL 20 MG PO TABS
20.0000 mg | ORAL_TABLET | Freq: Every day | ORAL | Status: DC
  Filled 2012-08-29 (×3): qty 1

## 2012-08-29 MED ORDER — ASPIRIN 81 MG PO CHEW
CHEWABLE_TABLET | ORAL | Status: AC
  Administered 2012-08-29: 324 mg via ORAL
  Filled 2012-08-29: qty 4

## 2012-08-29 MED ORDER — NITROGLYCERIN 0.2 MG/ML ON CALL CATH LAB
INTRAVENOUS | Status: AC
  Filled 2012-08-29: qty 1

## 2012-08-29 MED ORDER — ASPIRIN 81 MG PO CHEW: 324.0000 mg | CHEWABLE_TABLET | ORAL | Status: AC

## 2012-08-29 NOTE — Interval H&P Note (Signed)
History and Physical Interval Note:  08/29/2012 8:55 AM  [name]  has presented today for surgery, with the diagnosis of CAD  The various methods of treatment have been discussed with the patient and family. After consideration of risks, benefits and other options for treatment, the patient has consented to  Procedure(s): PERCUTANEOUS CORONARY STENT INTERVENTION (PCI-S) (N/A) as a surgical intervention .  The patient's history has been reviewed, patient examined, no change in status, stable for surgery.  I have reviewed the patient's chart and labs.  Questions were answered to the patient's satisfaction.    Cath Lab Visit (complete for each Cath Lab visit)  Clinical Evaluation Leading to the Procedure:   ACS: no  Non-ACS:    Anginal Classification: CCS III  Anti-ischemic medical therapy: Minimal Therapy (1 class of medications)  Non-Invasive Test Results: High-risk stress test findings: cardiac mortality >3%/year  Prior CABG: No previous CABG       [name] [facility] 08/29/2012 8:56 AM

## 2012-08-29 NOTE — H&P (View-Only) (Signed)
HPI   [name] is a 57 y.o. male is seen in followup for ICD implanted as per the MASTER trial with prior myocardial infarction complicated by shock and prior stenting.   Underwent catheterization in 2010 demonstrated ejection fraction of 35% and severe LAD stenosis secondary to acute thrombus.    Because of ventricular tachycardia and exertional chest pain shortness of breath he underwent catheterization April 2012 demonstrating a new lesion in the mid LAD that was felt to be critical;  proximal mid LAD stents and mid RCA stents were patent. LVEF was 20-25%     Left heart cath 4/13 >>There is a long stented segment in the proximal to mid LAD is patent. There is diffuse 10-20% narrowing within the stent. The recently placed stent in the mid LAD is widely patent. It jails the ostium of the second diagonal branch with a 90% lesion. Theres is TIMI-3 in the ostium of the D2.   Left circumflex (LCx): A left circumflex is a large vessel. There is a 50-60% stenosis in the proximal vessel. He first obtuse marginal vessel is very small in caliber and is sub-totally occluded proximally. The second and third marginal branches are large vessels. There is 30% lesion in ostium of OM-2   Right coronary artery (RCA): The right coronary has an inferior take off. It is a codominant vessel. There is 40% narrowing at the ostium. A stent is noted in the mid right coronary and is widely patent with less than 10-20% irregularities. There is a 30-40% lesion in the distal right coronary. Stable CAD as described above. The D2 is jailed by the recently placed LAD stent but there is good flow   And he is having discomfort with stress as well as intermittently with exertion he describes it as a burning lead was epigastrium into his chest shoulder neck area. It is distinct from his prior coronary pain.    Past Medical History   Diagnosis  Date   .  Automatic implantable cardiac defibrillator in situ         a. s/p MDT  [facility]   .  HTN (hypertension)     .  HLD (hyperlipidemia)  mixed   .  Ischemic cardiomyopathy         a.  EF 30-35% 2010;  b.  EF 20-25% by LV gram 05/2011   .  CAD (coronary artery disease)         a.  Severe LAD stenosis secondary to acute thrombus - BMS 2009;  b. 06/01/11 Cath - LM nl, LAD 20 isr, 68m, LCX 50p, OM1 100p, RCA 40ost, patent mid-stent, 30d, EF 20-25% - The LAD was treated with a 3.0x45mm Veri-flex BMS   .  Anxiety     .  Paroxysmal ventricular tachycardia         a. VFlutter  CL 210 msec  Rx shock 04/2011   .  Systolic CHF, chronic     .  Myocardial infarction           Past Surgical History   Procedure  Laterality  Date   .  Laproscopic cholecystectomy       .  Defibrillator implantation    04/12/02       primary prevention for sudden death; Dr. Graciela Husbands    .  Cardiac stent  lad    06/01/2011   .  Cardiac catheterization       .  Cardiac defibrillator placement  Current Outpatient Prescriptions   Medication  Sig  Dispense  Refill   .  aspirin EC 81 MG tablet  Take 81 mg by mouth daily.         .  carvedilol (COREG) 12.5 MG tablet  Take 12.5 mg by mouth 2 (two) times daily with a meal. Take one tablet by mouth twice daily         .  clopidogrel (PLAVIX) 75 MG tablet  Take 75 mg by mouth at bedtime.          .  furosemide (LASIX) 20 MG tablet  Take 1 tablet (20 mg total) by mouth every morning.   30 tablet   3   .  lisinopril (PRINIVIL,ZESTRIL) 20 MG tablet  TAKE ONE TABLET BY MOUTH EVERY DAY   30 tablet   11   .  nitroGLYCERIN (NITROSTAT) 0.4 MG SL tablet  Place 0.4 mg under the tongue every 5 (five) minutes as needed. For chest pain         .  simvastatin (ZOCOR) 80 MG tablet  Take 80 mg by mouth at bedtime.             No current facility-administered medications for this visit.        No Known Allergies   Review of Systems negative except from HPI and PMH   Physical Exam BP 118/68  Pulse 80  Ht 5\' 11"  (1.803 m)  Wt 246 lb (111.585 kg)   BMI 34.33 kg/m2 Well developed and nourished in no acute distress HENT normal Neck supple with JVP-flat Clear Regular rate and rhythm, no murmurs or gallops Abd-soft with active BS No Clubbing cyanosis edema Skin-warm and dry A & Oriented  Grossly normal sensory and motor function         Assessment and  Plan            Unstable angina - [name], MD at 07/22/2012  3:06 PM    Status: Written Related Problem: Unstable angina           He is having crescendo chest discomfort. It is distinct from his prior angina we will undertake a Myoview scan initially. I would have a very low threshold for proceeding with catheterization. We'll also empirically begin him on a PPI.         Ischemic cardiomyopathy - [name], MD at 07/22/2012  3:06 PM    Status: Written Related Problem: Ischemic cardiomyopathy           Continue current medications for now.         VENTRICULAR TACHYCARDIA - [name], MD at 07/22/2012  3:07 PM    Status: Written Related Problem: VENTRICULAR TACHYCARDIA           No intercurrent Ventricular tachycardia            ICD-Medtronic - [name], MD at 07/22/2012  3:07 PM    Status: Written Related Problem: ICD-Medtronic           The patient's device was interrogated.  The information was reviewed. No changes were made in the programming.     a meal at         TOBACCO ABUSE - [name], MD at 07/22/2012  3:07 PM    Status: Written Related Problem: TOBACCO ABUSE           HE stopped smoking     [name] SITE 3 NUCLEAR  MED  [address] [place], [place] [address]  [phone]    Cardiology Nuclear Med Study  [name] is a 57 y.o. male MRN : [id number] DOB: November 23, 1955  Procedure Date: 08/11/2012  Nuclear Med Background  Indication for Stress Test: Evaluation for Ischemia  History: 2013 Stent- LAD, EF 20-25%, 2009 Echo EF 20-25%, 2007 MPS No ischemia, 2004 ICD  Cardiac Risk Factors: History of  Smoking, Hypertension and Lipids  Symptoms: Chest Pain, DOE, Fatigue and Palpitations  Nuclear Pre-Procedure  Caffeine/Decaff Intake: None  NPO After: 6 pm   Lungs: clear  O2 Sat: 97% on room air.  IV 0.9% NS with Angio Cath: 22g   IV Site: R Hand  IV Started by: Bonnita Levan, RN   Chest Size (in): 46  Cup Size: n/a   Height: 5\' 11"  (1.803 m)  Weight: 242 lb (109.77 kg)   BMI: Body mass index is 33.77 kg/(m^2).  Tech Comments: N/A   Nuclear Med Study  1 or 2 day study: 1 day  Stress Test Type: Lexiscan   Reading MD: Cassell Clement, MD  Order Authorizing Provider: Berton Mount, MD   Resting Radionuclide: Technetium 25m Sestamibi  Resting Radionuclide Dose: 11.0 mCi   Stress Radionuclide: Technetium 43m Sestamibi  Stress Radionuclide Dose: 33.0 mCi   Stress Protocol  Rest HR: 69  Stress HR: 87   Rest BP: 110/66  Stress BP: 109/61   Exercise Time (min): n/a  METS: n/a   Predicted Max HR: 161 bpm  % Max HR: 54.04 bpm  Rate Pressure Product: 9483  Dose of Adenosine (mg): 60 mg  Dose of Lexiscan: n/a mg   Dose of Atropine (mg): n/a  Dose of Dobutamine: n/a mcg/kg/min (at max HR)   Stress Test Technologist: Bonnita Levan, RN  Nuclear Technologist: Doyne Keel, CNMT   Rest Procedure: Myocardial perfusion imaging was performed at rest 45 minutes following the intravenous administration of Technetium 51m Sestamibi.  Rest ECG: NSR with LBBB  Stress Procedure: The patient received IV adenosine at 140 mcg/kg/min for 4-minutes with concurrent submaximal exercise (1.77mph, 0% grade). Technetium 36m Sestamibi was injected at the 2-minute mark and quantitative spect images were obtained.  Stress ECG: No significant change from baseline ECG  QPS  Raw Data Images: Normal; no motion artifact; normal heart/lung ratio.  Stress Images: There is decreased uptake in the apex and mid and distal anterior wall.  Rest Images: There is decreased uptake in the apex and mid and distal anterior wall.  Subtraction  (SDS): No evidence of ischemia.  Transient Ischemic Dilatation (Normal <1.22): 1.04  Lung/Heart Ratio (Normal <0.45): 0.43  Quantitative Gated Spect Images  QGS EDV: 350 ml  QGS ESV: 258 ml  Impression  Exercise Capacity: Adenosine study with no exercise.  BP Response: Normal blood pressure response.  Clinical Symptoms: Mild chest pain/dyspnea.  ECG Impression: Baseline: LBBB. EKG uninterpretable due to LBBB at rest and stress.  Comparison with Prior Nuclear Study: No significant change from previous study  Overall Impression: High risk stress nuclear study. There is a large fixed defect involving apex, apical anterior and mid-anterior segments and apical septal segments. There is a small fixed defect involving the basalinferolateral and midinferolateral segments. No significant reversibility. There is severe LV dysfunction with marked LV enlargement and apical akinesis. EF is 26%  LV Ejection Fraction: 26%. LV Wall Motion: Severe LV systolic dysfunction with global hypokinesis and apical akinesis.  Cassell Clement    Patient with recent chest pain. Abnormal  myoview study. Will proceed with cardiac cath today.  Peter Swaziland MD, [facility]  08/23/2012 12:05 PM

## 2012-08-29 NOTE — Progress Notes (Signed)
Utilization Review Completed.   Harrietta Incorvaia, RN, BSN Nurse Case Manager  [phone]  

## 2012-08-29 NOTE — CV Procedure (Addendum)
   CARDIAC CATH NOTE  Name: [name] MRN: [id number] DOB: [birth year]  Procedure: PTCA and stenting of the left circumflex/OM2 bifurcation stenosis.  Indication: 57 year old white male with history of coronary disease status post remote anterior myocardial infarction with stenting of the LAD and right coronary at that time. Recently he presented with increasing anginal symptoms. Stress Myoview study was a high-risk study with extensive anterior and apical scar and inferior lateral ischemia. Ejection fraction was 25%. Subsequent diagnostic cardiac catheterization demonstrated patent stents in the LAD and right coronary. There are new high-grade tandem lesions in the proximal and mid left circumflex up to 90% involving the origin of a large second obtuse marginal vessel the true bifurcation. The first marginal branch was occluded. Patient returns for complex stenting of the left circumflex/OM 2 bifurcation.  Procedural Details: The right groin was prepped, draped, and anesthetized with 1% lidocaine. Using the modified Seldinger technique, a 7 Fr sheath was introduced into the right femoral artery.  Weight-based bivalirudin was given for anticoagulation. Once a therapeutic ACT was achieved, a 7 [place] left Voda 4 guide catheter was inserted.  A pro-water coronary guidewire was used to cross the lesion in the second obtuse marginal branch first.  We then placed a second pro-water coronary guidewire in the main left circumflex. A mini crush technique was used to stent the bifurcation. The lesion in the OM 2 was predilated with a 2.5 mm balloon.  The lesion was then stented with a 3.0 x 20 mm Promus premier stent. The main left circumflex was then dilated with a 4.0 noncompliant balloon to nominal pressures. We then stented the main left circumflex across the OM 2 with a 4.0 x 24 mm Promus premier stent. We then withdrew the coronary guidewire from the OM 2 and redirected it through the stent in the left  circumflex into the OM 2 again. The stent in the OM 2 was postdilated with a 2.25 mm noncompliant balloon. We exchanged this for a 3.0 mm noncompliant balloon and dilated the stent in the OM 2 to 12 atmospheres. We then dilated the left circumflex stent with a 4.0 mm noncompliant balloon to 14 atmospheres. We then performed a kissing balloon inflation with these 2 balloons each to 6 atmospheres. At this point there was an excellent angiographic result at the bifurcation. There was still focal underinflation of the proximal left circumflex stent. We dilated this region with a 4.5 mm noncompliant balloon to 14 atmospheres. Following PCI, there was 0% residual stenosis and TIMI-3 flow in both branches. Final angiography confirmed an excellent result. The patient tolerated the procedure well. There were no immediate procedural complications. Femoral hemostasis was achieved with manual compression. The patient was transferred to the post catheterization recovery area for further monitoring.  Lesion Data: Vessel: Proximal to mid left circumflex/OM 2 bifurcation stenosis. Percent stenosis (pre): 90% in each branch TIMI-flow (pre):  3 Stent:  3.0 x 20 mm Promus premier for the OM 2 and 4.0 x 24 mm Promus premier for the left circumflex. Percent stenosis (post): 0% TIMI-flow (post): 3  Conclusions: Successful bifurcation stenting of the native left circumflex and second obtuse marginal branch using a mini crush technique with drug-eluting stents.  Recommendations: Dual antiplatelet therapy with aspirin and Plavix indefinitely.  Ashely Goosby [facility] 08/29/2012, 10:08 AM

## 2012-08-30 ENCOUNTER — Encounter (HOSPITAL_COMMUNITY): Payer: Self-pay | Admitting: Nurse Practitioner

## 2012-08-30 DIAGNOSIS — I209 Angina pectoris, unspecified: Secondary | ICD-10-CM

## 2012-08-30 DIAGNOSIS — I2589 Other forms of chronic ischemic heart disease: Secondary | ICD-10-CM

## 2012-08-30 DIAGNOSIS — I10 Essential (primary) hypertension: Secondary | ICD-10-CM

## 2012-08-30 DIAGNOSIS — I2510 Atherosclerotic heart disease of native coronary artery without angina pectoris: Secondary | ICD-10-CM

## 2012-08-30 DIAGNOSIS — I472 Ventricular tachycardia: Secondary | ICD-10-CM

## 2012-08-30 LAB — CBC
HCT: 37.7 % — ABNORMAL LOW (ref 39.0–52.0)
Hemoglobin: 12.8 g/dL — ABNORMAL LOW (ref 13.0–17.0)
MCV: 91.1 fL (ref 78.0–100.0)
RBC: 4.14 MIL/uL — ABNORMAL LOW (ref 4.22–5.81)
RDW: 13.8 % (ref 11.5–15.5)
WBC: 6.5 10*3/uL (ref 4.0–10.5)

## 2012-08-30 LAB — BASIC METABOLIC PANEL
BUN: 17 mg/dL (ref 6–23)
CO2: 26 mEq/L (ref 19–32)
Chloride: 103 mEq/L (ref 96–112)
GFR calc Af Amer: 90 mL/min — ABNORMAL LOW (ref >90)
Glucose, Bld: 104 mg/dL — ABNORMAL HIGH (ref 70–99)
Potassium: 4.3 mEq/L (ref 3.5–5.1)

## 2012-08-30 MED FILL — Sodium Chloride IV Soln 0.9%: INTRAVENOUS | Qty: 50 | Status: AC

## 2012-08-30 NOTE — Progress Notes (Signed)
CARDIAC REHAB PHASE I   PRE:  Rate/Rhythm: 69 SR    BP: sitting 107/58    SaO2:   MODE:  Ambulation: 600 ft   POST:  Rate/Rhythm: 83 SR    BP: sitting 130/76     SaO2:   Slow moving, he sts due to bed. Sts groin feels fine. No angina. Ed reviewed. Pt wanting to quit smoking and discussed methods. Has 1800quitnow resource. Unable to do CRPII due to logistics/work. [id number]   Elissa Lovett [place] CES, ACSM 08/30/2012 8:34 AM

## 2012-08-30 NOTE — Progress Notes (Signed)
   Patient Name: [name] Date of Encounter: 08/30/2012   Principal Problem:   Unstable angina Active Problems:   Ischemic cardiomyopathy   CAD (coronary artery disease)   HYPERTENSION, UNSPECIFIED   HYPERLIPIDEMIA-MIXED   ICD-Medtronic   SUBJECTIVE  No chest pain or sob.  CURRENT MEDS . aspirin EC  81 mg Oral Daily  . atorvastatin  40 mg Oral q1800  . carvedilol  12.5 mg Oral BID WC  . clopidogrel  75 mg Oral Q breakfast  . furosemide  20 mg Oral q morning - 10a  . lisinopril  20 mg Oral Daily   OBJECTIVE  Filed Vitals:   08/29/12 1600 08/29/12 2007 08/30/12 0041 08/30/12 0538  BP: 96/65 94/48 94/58  95/57  Pulse: 63 66 77 72  Temp: 97.8 F (36.6 C) 97.4 F (36.3 C) 98 F (36.7 C) 98.2 F (36.8 C)  TempSrc: Oral Oral Oral Oral  Resp: 16 18 18 18   Height:      Weight:   243 lb 13.3 oz (110.6 kg)   SpO2: 97% 96% 93% 93%    Intake/Output Summary (Last 24 hours) at 08/30/12 0726 Last data filed at 08/29/12 2009  Gross per 24 hour  Intake 2378.79 ml  Output      0 ml  Net 2378.79 ml   Filed Weights   08/29/12 0705 08/30/12 0041  Weight: 242 lb (109.77 kg) 243 lb 13.3 oz (110.6 kg)   PHYSICAL EXAM  General: Pleasant, NAD. Neuro: Alert and oriented X 3. Moves all extremities spontaneously. Psych: Normal affect. HEENT:  Normal  Neck: Supple without bruits or JVD. Lungs:  Resp regular and unlabored, CTA. Heart: RRR no s3, s4, or murmurs. Abdomen: Soft, non-tender, non-distended, BS + x 4.  Extremities: No clubbing, cyanosis or edema. DP/PT/Radials 2+ and equal bilaterally.  R groin cath site w/o bleeding/bruit/hematoma.  Accessory Clinical Findings  CBC  Recent Labs  08/29/12 0718 08/30/12 0505  WBC 5.2 6.5  HGB 14.4 12.8*  HCT 42.5 37.7*  MCV 90.2 91.1  PLT 202 169   Basic Metabolic Panel  Recent Labs  08/29/12 0718 08/30/12 0505  NA 135 136  K 4.3 4.3  CL 103 103  CO2 23 26  GLUCOSE 104* 104*  BUN 14 17  CREATININE 0.97 1.03    CALCIUM 8.9 8.8   TELE  Sinus, pvc's, couplets.  ECG  Rsr, 69, ivcd, poor r prog, inflat twi (twi in III/aVF new).  Radiology/Studies  No results found.  ASSESSMENT AND PLAN  1.  USA/CAD:  S/p PCI of the LCX and OM2 yesterday.  No chest pain overnight.  Ambulate this AM with plan to d/c.  Cont asa, statin, bb, acei, plavix.  2.  ICM/Chronic systolic CHF:  euvolemic on exam.  Cont bb/acei/lasix.  3. HTN:  Stable.  4.  HL: cont statin.  Signed, Nicolasa Ducking NP Patient seen and examined and history reviewed. Agree with above findings and plan. Groin site without hematoma. No chest pain. Stable for discharge today. Continue ASA and Plavix.  [name] JordanMD 08/30/2012 9:07 AM

## 2012-08-30 NOTE — Discharge Summary (Signed)
Patient ID: KEY CEN,  MRN: [id number], DOB/AGE: 59-Sep-1957 59 y.o.  Admit date: 08/29/2012 Discharge date: 08/30/2012  Primary Cardiologist: Odessa Fleming, MD   Discharge Diagnoses Principal Problem:   Unstable angina  **s/p PCI/DES to the OM2 and LCX this admission.  Active Problems:   Ischemic cardiomyopathy   CAD (coronary artery disease)   HYPERTENSION, UNSPECIFIED   HYPERLIPIDEMIA-MIXED   ICD-Medtronic  Allergies No Known Allergies  Procedures  Percutaneous Coronary Intervention 7.7.2014  **PCI and Drug eluting stent placement to the OM2 with a 3.0 x 20 mm Promus Premier DES along with placement of a 4.0 x 24 mm Promus Premier DES within the Left Circumflex.  History of Present Illness  57 year old male with prior history of coronary artery disease and ischemic cardiomyopathy who was seen in clinic in May with reports of recurrent substernal chest discomfort. He subsequently underwent stress testing in June which showed a large fixed defect involving the apex, apical anterior, mid-anterior segments, and apical septal segments as well as a small fixed defect involving the basal inferolateral and mid inferolateral segments. No significant reversibility was noted. EF was 26%.decision was made to pursue diagnostic catheterization this was performed on July 1 revealing new stenoses within the left circumflex and second obtuse marginal. It was felt that these areas were amenable to PCI and this was subsequently scheduled for July 7.  Hospital Course  Patient presented to the [facility] cardiac catheterization laboratory and underwent successful PCI and stenting of the left circumflex with placement of a 4.0 x 24 mm Promus Premier drug-eluting stent. The second obtuse marginal stent with a 3.0 x 20 mm Promus Premier drug-eluting stent. Patient tolerated procedure well and post procedure has been doing without recurrent symptoms or limitations. We plan to discharge him today in good  condition.  Discharge Vitals Blood pressure 107/58, pulse 71, temperature 98 F (36.7 C), temperature source Oral, resp. rate 18, height 5\' 11"  (1.803 m), weight 243 lb 13.3 oz (110.6 kg), SpO2 95.00%.  Filed Weights   08/29/12 0705 08/30/12 0041  Weight: 242 lb (109.77 kg) 243 lb 13.3 oz (110.6 kg)   Labs  CBC  Recent Labs  08/29/12 0718 08/30/12 0505  WBC 5.2 6.5  HGB 14.4 12.8*  HCT 42.5 37.7*  MCV 90.2 91.1  PLT 202 169   Basic Metabolic Panel  Recent Labs  08/29/12 0718 08/30/12 0505  NA 135 136  K 4.3 4.3  CL 103 103  CO2 23 26  GLUCOSE 104* 104*  BUN 14 17  CREATININE 0.97 1.03  CALCIUM 8.9 8.8   Disposition  Pt is being discharged home today in good condition.  Follow-up Plans & Appointments      Follow-up Information   Follow up with Rick Duff, PA-C On 09/13/2012. (8:30 AM - Dr. Odessa Fleming PA)    Contact information:   [address] [place] [address] [phone]      Discharge Medications    Medication List         aspirin EC 81 MG tablet  Take 81 mg by mouth daily.     carvedilol 12.5 MG tablet  Commonly known as:  COREG  Take 12.5 mg by mouth 2 (two) times daily with a meal. Take  1 1/2 tablets two times daily with meals     clopidogrel 75 MG tablet  Commonly known as:  PLAVIX  Take 75 mg by mouth daily. Take one table by mouth everyday with breakfast.  furosemide 20 MG tablet  Commonly known as:  LASIX  Take 1 tablet (20 mg total) by mouth every morning.     lisinopril 20 MG tablet  Commonly known as:  PRINIVIL,ZESTRIL  Take 20 mg by mouth daily.     nitroGLYCERIN 0.4 MG SL tablet  Commonly known as:  NITROSTAT  Place 1 tablet (0.4 mg total) under the tongue every 5 (five) minutes as needed. For chest pain     simvastatin 80 MG tablet  Commonly known as:  ZOCOR  Take 80 mg by mouth at bedtime.       Outstanding Labs/Studies  None  Duration of Discharge Encounter   Greater than 30 minutes  including physician time.  Signed, Nicolasa Ducking NP 08/30/2012, 8:30 AM   Patient seen and examined and history reviewed. Agree with above findings and plan. See earlier rounding note.  [name] [facility] 08/30/2012 9:10 AM

## 2012-09-12 NOTE — Progress Notes (Addendum)
CARDIOLOGY OFFICE NOTE  Patient ID: [name] MRN: [id number], DOB/AGE: Jul 15, 1955   Date of Visit: 09/13/2012  Primary Physician: Pcp Not In System Primary Cardiologist / EP: Olga Millers, MD / Berton Mount, MD Reason for Visit: Hospital follow-up  History of Present Illness  [name] is a 57 y.o. male with CAD, ischemic CM s/p ICD implant, HTN and dyslipidemia who presents today for hospital followup. He was admitted to [facility] on 08/29/2012 with [place]. He underwent cardiac cath which revealed 2-vessel obstructive CAD. He is now s/p PCI / DES to OM-2 and LCx.    Since discharge,  he reports he is doing well and has no complaints. He denies chest pain or shortness of breath. He denies palpitations, dizziness, near syncope or syncope. He denies ICD shocks. He denies LE swelling, orthopnea, PND or recent weight gain. He is compliant and tolerating medications without difficulty.  Past Medical History Past Medical History  Diagnosis Date  . HTN (hypertension)   . HLD (hyperlipidemia) mixed  . Ischemic cardiomyopathy     a.  EF 30-35% 2010;  b.  EF 20-25% by LV gram 08/2012.  [name] CAD (coronary artery disease)     a.  Severe LAD stenosis 2/2 acute thrombus - BMS 2009;  b. 06/01/11 Cath - LAD 20 isr, 72m (3.0x16 Veri-flex BMS), OM1 100p, EF 20-25%;  c. 07/2012 Abnl Cardiolite;  d. 08/2012 Cath/PCI: LM nl, LAD patent stents, D2 80-90 (jailed), LCX 90 p/m (4.0x24 Promus Premier DES), OM1 100, OM2 80-90p (3.0x20 Promus Premier DES), RCA patent mid stent, 40d, EF 25%.  . Anxiety   . Paroxysmal ventricular tachycardia     a. VFlutter  CL 210 msec  Rx shock 04/2011  . Systolic CHF, chronic   . Automatic implantable cardioverter-defibrillator in situ   . Heart murmur     "as a child" (08/29/2012)  . History of rheumatic fever 1962  . Anginal pain     "only w/my MI" (08/29/2012)  . Myocardial infarction 1998; 2002; ~ 2010    Past Surgical History Past Surgical History  Procedure Laterality Date  .  Defibrillator implantation  04/12/02    primary prevention for sudden death; Dr. Graciela Husbands   . Cardiac defibrillator placement      primary prevention for sudden death; Dr. Graciela Husbands   . Cardiac catheterization      "lots of those" (08/29/2012)  . Coronary angioplasty with stent placement  2002-08/29/2012    "think I had 4 total then 2 today" to LAD 06/01/2011/notes (08/29/2012)  . Laparoscopic cholecystectomy    . Hernia repair      "w/gallbladder OR" (08/29/2012)    Allergies/Intolerances No Known Allergies  Current Home Medications Current Outpatient Prescriptions  Medication Sig Dispense Refill  . aspirin EC 81 MG tablet Take 81 mg by mouth daily.      . carvedilol (COREG) 12.5 MG tablet Take 12.5 mg by mouth daily. Take  1 tablets  daily with meals      . clopidogrel (PLAVIX) 75 MG tablet Take 75 mg by mouth daily. Take one table by mouth everyday with breakfast.      . furosemide (LASIX) 20 MG tablet Take 1 tablet (20 mg total) by mouth every morning.  30 tablet  3  . lisinopril (PRINIVIL,ZESTRIL) 20 MG tablet Take 20 mg by mouth daily.      . nitroGLYCERIN (NITROSTAT) 0.4 MG SL tablet Place 1 tablet (0.4 mg total) under the tongue every 5 (five) minutes  as needed. For chest pain  25 tablet  3  . simvastatin (ZOCOR) 80 MG tablet Take 80 mg by mouth at bedtime.       No current facility-administered medications for this visit.   Social History History   Social History  . Marital Status: Married    Spouse Name: N/A    Number of Children: N/A  . Years of Education: N/A   Occupational History  . Not on file.   Social History Main Topics  . Smoking status: Former Smoker -- 1.00 packs/day for 44 years    Types: Cigarettes    Quit date: 05/27/2011  . Smokeless tobacco: Never Used  . Alcohol Use: 4.8 oz/week    8 Cans of beer per week     Comment: 08/29/2012 "6-8 beers once/wk"  . Drug Use: No  . Sexually Active: Not Currently   Other Topics Concern  . Not on file   Social History  Narrative   Married; full time.     Review of Systems General: No chills, fever, night sweats or weight changes Cardiovascular: No chest pain, dyspnea on exertion, edema, orthopnea, palpitations, paroxysmal nocturnal dyspnea Dermatological: No rash, lesions or masses Respiratory: No cough, dyspnea Urologic: No hematuria, dysuria Abdominal: No nausea, vomiting, diarrhea, bright red blood per rectum, melena, or hematemesis Neurologic: No visual changes, weakness, changes in mental status All other systems reviewed and are otherwise negative except as noted above.  Physical Exam Vitals: Blood pressure 128/78, pulse 71, height 5\' 11"  (1.803 m), weight 241 lb (109.317 kg), SpO2 95.00%.  General: Well developed, well appearing 57 y.o. male in no acute distress. HEENT: Normocephalic, atraumatic. EOMs intact. Sclera nonicteric. Oropharynx clear.  Neck: Supple without bruits. No JVD. Lungs: Respirations regular and unlabored, CTA bilaterally. No wheezes, rales or rhonchi. Heart: RRR. S1, S2 present. No murmurs, rub, S3 or S4. Abdomen: Soft, non-tender, non-distended. BS present x 4 quadrants. No hepatosplenomegaly.  Extremities: No clubbing, cyanosis or edema. DP/PT/Radials 2+ and equal bilaterally. Psych: Normal affect. Neuro: Alert and oriented X 3. Moves all extremities spontaneously.   Diagnostics Cardiac catheterization Procedural Findings:  Hemodynamics:  AO 108/66 mean 85 mm Hg  LV 106/30 mm Hg.  Coronary angiography:  Coronary dominance: right  Left mainstem: Normal  Left anterior descending (LAD): The LAD has extensive stents from the proximal to mid vessel. These stents are widely patent. The second diagonal is jailed with 80-90% ostial stenosis.  Left circumflex (LCx): The circumflex is a very large vessel. There is severe disease in the proximal and mid vessel with tandem 90% stenoses. The first OM is small and occluded. The second OM is a large branch and has 80-90% stenosis at  the takeoff and proximal vessel that is involved with the main circumflex lesion.  Right coronary artery (RCA): The RCA arises inferiorly. The stent in the mid vessel is widely patent. There is 40% disease at the crux.  Left ventriculography: Left ventricular systolic function is abnormal. There is extensive akinesis involving the mid to distal anterior wall, distal inferior wall, and apex. Overall LV function is severely reduced with EF of 25%.  Final Conclusions:  1. Severe single vessel obstructive CAD with complex bifurcation LCX/OM2 disease. The stents in the LAD and RCA are patent. Chronic jailing of the second diagonal.  2. Severe LV dysfunction.  Recommendations: Will consider the patient for high risk PCI of the LCX/ OM disease.   PCI note: Lesion Data:  Vessel: Proximal to mid left circumflex/OM 2  bifurcation stenosis.  Percent stenosis (pre): 90% in each branch  TIMI-flow (pre): 3  Stent: 3.0 x 20 mm Promus premier for the OM 2 and 4.0 x 24 mm Promus premier for the left circumflex.  Percent stenosis (post): 0%  TIMI-flow (post): 3  Conclusions: Successful bifurcation stenting of the native left circumflex and second obtuse marginal branch using a mini crush technique with drug-eluting stents.    Assessment and Plan 1. CAD s/p recent PCI / DES to OM-2 and LCx Stable without anginal symptoms Continue medical therapy  Follow-up with primary cardiologist, Dr. Jens Som, in 6 weeks 2. Ischemic CM with chronic systolic HF, ICD in place Stable without HF symptoms; euvolemic by exam today Continue medical therapy Continue routine remote ICD follow-up every 3 months (next remote 10/25/2012) Follow-up as scheduled with Dr. Graciela Husbands June 2015 3. HTN Stable; normotensive today Continue current regimen  Signed, Darryon Bastin, PA-C 09/13/2012, 9:02 AM

## 2012-09-13 ENCOUNTER — Ambulatory Visit (INDEPENDENT_AMBULATORY_CARE_PROVIDER_SITE_OTHER): Payer: Self-pay | Admitting: Cardiology

## 2012-09-13 ENCOUNTER — Encounter: Payer: Self-pay | Admitting: Cardiology

## 2012-09-13 VITALS — BP 128/78 | HR 71 | Ht 71.0 in | Wt 241.0 lb

## 2012-09-13 DIAGNOSIS — Z9861 Coronary angioplasty status: Secondary | ICD-10-CM

## 2012-09-13 DIAGNOSIS — I10 Essential (primary) hypertension: Secondary | ICD-10-CM

## 2012-09-13 DIAGNOSIS — Z955 Presence of coronary angioplasty implant and graft: Secondary | ICD-10-CM

## 2012-09-13 DIAGNOSIS — I255 Ischemic cardiomyopathy: Secondary | ICD-10-CM

## 2012-09-13 DIAGNOSIS — I5022 Chronic systolic (congestive) heart failure: Secondary | ICD-10-CM

## 2012-09-13 DIAGNOSIS — I2510 Atherosclerotic heart disease of native coronary artery without angina pectoris: Secondary | ICD-10-CM

## 2012-09-13 DIAGNOSIS — I2589 Other forms of chronic ischemic heart disease: Secondary | ICD-10-CM

## 2012-09-13 NOTE — Patient Instructions (Signed)
NO CHANGES HAVE BEEN MADE  You have been scheduled for a follow up appt with Dr.Crenshaw in 6 weeks

## 2012-10-25 ENCOUNTER — Encounter: Payer: Self-pay | Admitting: Cardiology

## 2012-10-25 ENCOUNTER — Encounter: Payer: Self-pay | Admitting: Internal Medicine

## 2012-10-25 ENCOUNTER — Ambulatory Visit (INDEPENDENT_AMBULATORY_CARE_PROVIDER_SITE_OTHER): Payer: Self-pay | Admitting: *Deleted

## 2012-10-25 DIAGNOSIS — Z95810 Presence of automatic (implantable) cardiac defibrillator: Secondary | ICD-10-CM

## 2012-10-25 DIAGNOSIS — I255 Ischemic cardiomyopathy: Secondary | ICD-10-CM

## 2012-10-25 DIAGNOSIS — I472 Ventricular tachycardia: Secondary | ICD-10-CM

## 2012-10-25 DIAGNOSIS — I2589 Other forms of chronic ischemic heart disease: Secondary | ICD-10-CM

## 2012-11-04 LAB — REMOTE ICD DEVICE
BATTERY VOLTAGE: 3.0435 V
DEV-0020ICD: NEGATIVE
PACEART VT: 0
TOT-0001: 3
TOT-0006: 20100811000000
TZAT-0001SLOWVT: 1
TZAT-0002SLOWVT: NEGATIVE
TZAT-0012FASTVT: 200 ms
TZAT-0018FASTVT: NEGATIVE
TZAT-0018SLOWVT: NEGATIVE
TZAT-0020FASTVT: 1.5 ms
TZON-0003VSLOWVT: 450 ms
TZON-0004SLOWVT: 16
TZON-0004VSLOWVT: 32
TZON-0005SLOWVT: 12
TZST-0001FASTVT: 2
TZST-0001SLOWVT: 2
TZST-0001SLOWVT: 3
TZST-0001SLOWVT: 4
TZST-0001SLOWVT: 6
TZST-0002SLOWVT: NEGATIVE
TZST-0002SLOWVT: NEGATIVE
VENTRICULAR PACING ICD: 0.11 pct

## 2012-11-28 ENCOUNTER — Encounter: Payer: Self-pay | Admitting: *Deleted

## 2012-11-30 ENCOUNTER — Encounter: Payer: Self-pay | Admitting: Internal Medicine

## 2012-12-05 ENCOUNTER — Encounter: Payer: Self-pay | Admitting: Cardiology

## 2012-12-05 NOTE — Progress Notes (Signed)
HPI: [name] is a pleasant gentleman who has a history of coronary artery disease, ischemic cardiomyopathy, and history of ICD. His most recent cardiac catheterization was performed in July of 2014. Left main was normal. LAD stents were patent. The second diagonal was jailed with an 80-90% ostial stenosis. There was severe disease in the proximal circumflex. There was occlusion of the first marginal. There was an 80-90% second marginal. Stents in the right coronary were patent. Ejection fraction was 25%. Patient had PCI of the mid circumflex/OM 2 bifurcation stenosis with 2 drug-eluting stents. Dual antiplatelet therapy recommended indefinitely. He has had previous ICD as well. Since he was last seen,    Current Outpatient Prescriptions  Medication Sig Dispense Refill  . aspirin EC 81 MG tablet Take 81 mg by mouth daily.      . carvedilol (COREG) 12.5 MG tablet Take 12.5 mg by mouth daily. Take  1 tablets  daily with meals      . clopidogrel (PLAVIX) 75 MG tablet Take 75 mg by mouth daily. Take one table by mouth everyday with breakfast.      . furosemide (LASIX) 20 MG tablet Take 1 tablet (20 mg total) by mouth every morning.  30 tablet  3  . lisinopril (PRINIVIL,ZESTRIL) 20 MG tablet Take 20 mg by mouth daily.      . nitroGLYCERIN (NITROSTAT) 0.4 MG SL tablet Place 1 tablet (0.4 mg total) under the tongue every 5 (five) minutes as needed. For chest pain  25 tablet  3  . simvastatin (ZOCOR) 80 MG tablet Take 80 mg by mouth at bedtime.       No current facility-administered medications for this visit.     Past Medical History  Diagnosis Date  . HTN (hypertension)   . HLD (hyperlipidemia) mixed  . Ischemic cardiomyopathy     a.  EF 30-35% 2010;  b.  EF 20-25% by LV gram 08/2012.  [name] CAD (coronary artery disease)     a.  Severe LAD stenosis 2/2 acute thrombus - BMS 2009;  b. 06/01/11 Cath - LAD 20 isr, 3m (3.0x16 Veri-flex BMS), OM1 100p, EF 20-25%;  c. 07/2012 Abnl Cardiolite;  d.  08/2012 Cath/PCI: LM nl, LAD patent stents, D2 80-90 (jailed), LCX 90 p/m (4.0x24 Promus Premier DES), OM1 100, OM2 80-90p (3.0x20 Promus Premier DES), RCA patent mid stent, 40d, EF 25%.  . Anxiety   . Paroxysmal ventricular tachycardia     a. VFlutter  CL 210 msec  Rx shock 04/2011  . Systolic CHF, chronic   . Automatic implantable cardioverter-defibrillator in situ   . Heart murmur     "as a child" (08/29/2012)  . History of rheumatic fever 1962  . Anginal pain     "only w/my MI" (08/29/2012)  . Myocardial infarction 1998; 2002; ~ 2010    Past Surgical History  Procedure Laterality Date  . Defibrillator implantation  04/12/02    primary prevention for sudden death; Dr. Graciela Husbands   . Cardiac defibrillator placement      primary prevention for sudden death; Dr. Graciela Husbands   . Cardiac catheterization      "lots of those" (08/29/2012)  . Coronary angioplasty with stent placement  2002-08/29/2012    "think I had 4 total then 2 today" to LAD 06/01/2011/notes (08/29/2012)  . Laparoscopic cholecystectomy    . Hernia repair      "w/gallbladder OR" (08/29/2012)    History   Social History  . Marital Status: Married  Spouse Name: N/A    Number of Children: N/A  . Years of Education: N/A   Occupational History  . Not on file.   Social History Main Topics  . Smoking status: Former Smoker -- 1.00 packs/day for 44 years    Types: Cigarettes    Quit date: 05/27/2011  . Smokeless tobacco: Never Used  . Alcohol Use: 4.8 oz/week    8 Cans of beer per week     Comment: 08/29/2012 "6-8 beers once/wk"  . Drug Use: No  . Sexual Activity: Not Currently   Other Topics Concern  . Not on file   Social History Narrative   Married; full time.     ROS: no fevers or chills, productive cough, hemoptysis, dysphasia, odynophagia, melena, hematochezia, dysuria, hematuria, rash, seizure activity, orthopnea, PND, pedal edema, claudication. Remaining systems are negative.  Physical Exam: Well-developed well-nourished  in no acute distress.  Skin is warm and dry.  HEENT is normal.  Neck is supple.  Chest is clear to auscultation with normal expansion.  Cardiovascular exam is regular rate and rhythm.  Abdominal exam nontender or distended. No masses palpated. Extremities show no edema. neuro grossly intact  ECG     This encounter was created in error - please disregard.

## 2012-12-14 ENCOUNTER — Encounter: Payer: Self-pay | Admitting: Cardiology

## 2013-02-10 ENCOUNTER — Encounter: Payer: Self-pay | Admitting: *Deleted

## 2013-05-30 ENCOUNTER — Encounter: Payer: Self-pay | Admitting: *Deleted

## 2013-08-07 ENCOUNTER — Encounter: Payer: Self-pay | Admitting: *Deleted

## 2013-08-30 ENCOUNTER — Telehealth: Payer: Self-pay | Admitting: Cardiology

## 2013-08-30 NOTE — Telephone Encounter (Signed)
LMOVM for pt to send manual transmission due to monitor upgrades.

## 2013-09-01 ENCOUNTER — Telehealth: Payer: Self-pay | Admitting: Internal Medicine

## 2013-09-01 NOTE — Telephone Encounter (Signed)
Spoke with [name] GandyMelanie [name] and scheduled [name] for an August appointment with Dr. Graciela HusbandsKlein since they are unable to transmit remotely.

## 2013-09-01 NOTE — Telephone Encounter (Signed)
New Message:  Pt's wife is requesting she get a call back regarding her husbands transmission.. States she needs help sending it in.[name]..[name]

## 2013-09-08 ENCOUNTER — Encounter: Payer: Self-pay | Admitting: Cardiology

## 2013-09-14 ENCOUNTER — Telehealth: Payer: Self-pay | Admitting: Internal Medicine

## 2013-09-14 NOTE — Telephone Encounter (Signed)
Spoke with pt wife and confirmed 10-19-13 appt.

## 2013-09-14 NOTE — Telephone Encounter (Signed)
New message ° ° ° ° ° ° ° ° ° ° °Pt has questions about his transmission °

## 2013-09-18 ENCOUNTER — Telehealth: Payer: Self-pay | Admitting: *Deleted

## 2013-09-18 NOTE — Telephone Encounter (Signed)
Pt only sends remotes when scheduled, he only connects his monitor when scheduled. Pt will disconnect his internet when transmitting next time b/c his internet also uses the phone line. If that attempt does not work, we will give him a DSL filter when he sees Dr. Graciela HusbandsKlein in August.

## 2013-09-18 NOTE — Telephone Encounter (Signed)
Message copied by Glenda ChromanZIMMER, Latoy Labriola K on Mon Sep 18, 2013  4:52 PM ------      Message from: Ella JubileeGRIFFIN, KRISTEN E      Created: Mon Sep 18, 2013  3:00 PM      Regarding: patient question about transmission       Contact: [phone]       Patient is saying that the unit he has now will not dial out because he recently got wi-fi. What does he need to do please advise.  ------

## 2013-09-26 ENCOUNTER — Other Ambulatory Visit: Payer: Self-pay

## 2013-09-26 MED ORDER — FUROSEMIDE 20 MG PO TABS: 20.0000 mg | ORAL_TABLET | Freq: Every morning | ORAL | Status: DC

## 2013-09-27 ENCOUNTER — Other Ambulatory Visit: Payer: Self-pay

## 2013-09-27 MED ORDER — CLOPIDOGREL BISULFATE 75 MG PO TABS: 75.0000 mg | ORAL_TABLET | Freq: Every day | ORAL | Status: DC

## 2013-10-05 ENCOUNTER — Other Ambulatory Visit: Payer: Self-pay

## 2013-10-05 MED ORDER — CARVEDILOL 12.5 MG PO TABS: 12.5000 mg | ORAL_TABLET | Freq: Every day | ORAL | Status: DC

## 2013-10-19 ENCOUNTER — Encounter: Payer: Self-pay | Admitting: Internal Medicine

## 2013-12-14 ENCOUNTER — Encounter: Payer: Self-pay | Admitting: Internal Medicine

## 2013-12-14 ENCOUNTER — Ambulatory Visit (INDEPENDENT_AMBULATORY_CARE_PROVIDER_SITE_OTHER): Payer: Self-pay | Admitting: Internal Medicine

## 2013-12-14 VITALS — BP 109/66 | HR 85 | Ht 71.0 in | Wt 220.4 lb

## 2013-12-14 DIAGNOSIS — I472 Ventricular tachycardia: Secondary | ICD-10-CM

## 2013-12-14 DIAGNOSIS — I255 Ischemic cardiomyopathy: Secondary | ICD-10-CM

## 2013-12-14 DIAGNOSIS — Z4502 Encounter for adjustment and management of automatic implantable cardiac defibrillator: Secondary | ICD-10-CM

## 2013-12-14 DIAGNOSIS — I4729 Other ventricular tachycardia: Secondary | ICD-10-CM

## 2013-12-14 LAB — MDC_IDC_ENUM_SESS_TYPE_INCLINIC
Date Time Interrogation Session: 20151022163647
HIGH POWER IMPEDANCE MEASURED VALUE: 57 Ohm
HighPow Impedance: 44 Ohm
Lead Channel Impedance Value: 418 Ohm
Lead Channel Pacing Threshold Amplitude: 1.5 V
Lead Channel Pacing Threshold Pulse Width: 0.4 ms
Lead Channel Sensing Intrinsic Amplitude: 2.875 mV
Lead Channel Setting Pacing Amplitude: 3 V
MDC IDC MSMT BATTERY VOLTAGE: 2.96 V
MDC IDC MSMT LEADCHNL RV SENSING INTR AMPL: 1.875 mV
MDC IDC SET LEADCHNL RV PACING PULSEWIDTH: 0.4 ms
MDC IDC SET LEADCHNL RV SENSING SENSITIVITY: 0.3 mV
MDC IDC SET ZONE DETECTION INTERVAL: 300 ms
MDC IDC SET ZONE DETECTION INTERVAL: 360 ms
MDC IDC SET ZONE DETECTION INTERVAL: 450 ms
MDC IDC STAT BRADY RV PERCENT PACED: 0.04 %

## 2013-12-14 NOTE — Patient Instructions (Signed)
Your physician recommends that you have lab work today: BMP  Remote monitoring is used to monitor your Pacemaker of ICD from home. This monitoring reduces the number of office visits required to check your device to one time per year. It allows us to keep an eye on the functioning of your device to ensure it is working properly. You are scheduled for a device check from home on 03/19/13. You may send your transmission at any time that day. If you have a wireless device, the transmission will be sent automatically. After your physician reviews your transmission, you will receive a postcard with your next transmission date.  Your physician wants you to follow-up in: 1 year with Dr .Graciela HusbandsKlein. You will receive a reminder letter in the mail two months in advance. If you don't receive a letter, please call our office to schedule the follow-up appointment.

## 2013-12-14 NOTE — Progress Notes (Signed)
Patient Care Team: Pcp Not In System as PCP - General Lewayne BuntingBrian S Crenshaw, MD as PCP - Cardiology (Cardiology) No Pcp Per Patient (General Practice)   HPI  [name] LuriaLewis R [name] is a 58 y.o. male is seen in followup for ICD implanted as per the MASTER trial with prior myocardial infarction complicated by shock and prior stenting.  Underwent catheterization in 2010 demonstrated ejection fraction of 35% and severe LAD stenosis secondary to acute thrombus.   Because of ventricular tachycardia and exertional chest pain shortness of breath he underwent catheterization April 2012 demonstrating a new lesion in the mid LAD that was felt to be critical;  proximal mid LAD stents and mid RCA stents were patent. LVEF was 20-25%    Left heart cath 4/13 >>There is a long stented segment in the proximal to mid LAD is patent. There is diffuse 10-20% narrowing within the stent. The recently placed stent in the mid LAD is widely patent. It jails the ostium of the second diagonal branch with a 90% lesion. Theres is TIMI-3 in the ostium of the D2.  Left circumflex (LCx): A left circumflex is a large vessel. There is a 50-60% stenosis in the proximal vessel. He first obtuse marginal vessel is very small in caliber and is sub-totally occluded proximally. The second and third marginal branches are large vessels. There is 30% lesion in ostium of OM-2  Right coronary artery (RCA): The right coronary has an inferior take off. It is a codominant vessel. There is 40% narrowing at the ostium. A stent is noted in the mid right coronary and is widely patent with less than 10-20% irregularities. There is a 30-40% lesion in the distal right coronary. Stable CAD as described above. The D2 is jailed by the recently placed LAD stent but there is good flow     Past Medical History  Diagnosis Date  . HTN (hypertension)   . HLD (hyperlipidemia) mixed  . Ischemic cardiomyopathy     a.  EF 30-35% 2010;  b.  EF 20-25% by LV gram 08/2012.  [name].  CAD (coronary artery disease)     a.  Severe LAD stenosis 2/2 acute thrombus - BMS 2009;  b. 06/01/11 Cath - LAD 20 isr, 7261m (3.0x16 Veri-flex BMS), OM1 100p, EF 20-25%;  c. 07/2012 Abnl Cardiolite;  d. 08/2012 Cath/PCI: LM nl, LAD patent stents, D2 80-90 (jailed), LCX 90 p/m (4.0x24 Promus Premier DES), OM1 100, OM2 80-90p (3.0x20 Promus Premier DES), RCA patent mid stent, 40d, EF 25%.  . Anxiety   . Paroxysmal ventricular tachycardia     a. VFlutter  CL 210 msec  Rx shock 04/2011  . Systolic CHF, chronic   . Automatic implantable cardioverter-defibrillator in situ   . Heart murmur     "as a child" (08/29/2012)  . History of rheumatic fever 1962  . Anginal pain     "only w/my MI" (08/29/2012)  . Myocardial infarction 1998; 2002; ~ 2010    Past Surgical History  Procedure Laterality Date  . Defibrillator implantation  04/12/02    primary prevention for sudden death; Dr. Graciela HusbandsKlein   . Cardiac defibrillator placement      primary prevention for sudden death; Dr. Graciela HusbandsKlein   . Cardiac catheterization      "lots of those" (08/29/2012)  . Coronary angioplasty with stent placement  2002-08/29/2012    "think I had 4 total then 2 today" to LAD 06/01/2011/notes (08/29/2012)  . Laparoscopic cholecystectomy    . Hernia repair      "  w/gallbladder OR" (08/29/2012)    Current Outpatient Prescriptions  Medication Sig Dispense Refill  . aspirin EC 81 MG tablet Take 81 mg by mouth daily.      . carvedilol (COREG) 12.5 MG tablet Take 1 tablet (12.5 mg total) by mouth daily. Take  1 tablets  daily with meals  30 tablet  1  . clopidogrel (PLAVIX) 75 MG tablet Take 1 tablet (75 mg total) by mouth daily. Take one table by mouth everyday with breakfast.  30 tablet  1  . furosemide (LASIX) 20 MG tablet Take 1 tablet (20 mg total) by mouth every morning.  30 tablet  3  . lisinopril (PRINIVIL,ZESTRIL) 20 MG tablet Take 20 mg by mouth daily.      . nitroGLYCERIN (NITROSTAT) 0.4 MG SL tablet Place 1 tablet (0.4 mg total) under the  tongue every 5 (five) minutes as needed. For chest pain  25 tablet  3  . simvastatin (ZOCOR) 80 MG tablet Take 80 mg by mouth at bedtime.       No current facility-administered medications for this visit.    No Known Allergies  Review of Systems negative except from HPI and PMH  Physical Exam BP 109/66  Pulse 85  Ht 5\' 11"  (1.803 m)  Wt 220 lb 6.4 oz (99.973 kg)  BMI 30.75 kg/m2 Well developed and nourished in no acute distress HENT normal Neck supple with JVP-flat Clear Regular rate and rhythm, no murmurs or gallops Abd-soft with active BS No Clubbing cyanosis edema Skin-warm and dry A & Oriented  Grossly normal sensory and motor function  ECG demonstrates sinus rhythm at 85 Intervals 19/16/41 Axis is leftward at -88  Right bundle branch block left axis deviation Septal MI   Assessment and  Plan  Ischemic cardiomyopathy Without symptoms of ischemia   Ventricular tachycardia No intercurrent Ventricular tachycardia  Implantable defibrillator -Medtronic  The patient's device was interrogated.  The information was reviewed. No changes were made in the programming.

## 2013-12-15 LAB — BASIC METABOLIC PANEL
BUN: 13 mg/dL (ref 6–23)
CALCIUM: 9.4 mg/dL (ref 8.4–10.5)
CO2: 28 meq/L (ref 19–32)
Chloride: 103 mEq/L (ref 96–112)
Creatinine, Ser: 1.2 mg/dL (ref 0.4–1.5)
GFR: 66.75 mL/min (ref >60.00)
GLUCOSE: 84 mg/dL (ref 70–99)
Potassium: 4.4 mEq/L (ref 3.5–5.1)
SODIUM: 136 meq/L (ref 135–145)

## 2013-12-22 ENCOUNTER — Telehealth: Payer: Self-pay | Admitting: Internal Medicine

## 2013-12-22 NOTE — Telephone Encounter (Signed)
Pt is aware of lab results.

## 2013-12-22 NOTE — Telephone Encounter (Signed)
New problem ° ° °Pt returning call from nurse. °

## 2014-02-01 ENCOUNTER — Encounter (HOSPITAL_COMMUNITY): Payer: Self-pay | Admitting: Cardiology

## 2014-03-19 ENCOUNTER — Encounter: Payer: Self-pay | Admitting: *Deleted

## 2014-03-19 ENCOUNTER — Telehealth: Payer: Self-pay | Admitting: Cardiology

## 2014-03-19 NOTE — Telephone Encounter (Signed)
LMOVM reminding pt to send remote transmission.   

## 2014-03-20 ENCOUNTER — Encounter: Payer: Self-pay | Admitting: Cardiology

## 2014-05-04 ENCOUNTER — Encounter (HOSPITAL_COMMUNITY): Payer: Self-pay

## 2014-05-04 ENCOUNTER — Observation Stay (HOSPITAL_COMMUNITY)
Admission: EM | Admit: 2014-05-04 | Discharge: 2014-05-05 | Disposition: A | Discharge status: Home / Self Care | Payer: Self-pay | Attending: Cardiology | Admitting: Cardiology

## 2014-05-04 ENCOUNTER — Emergency Department (HOSPITAL_COMMUNITY): Payer: Self-pay

## 2014-05-04 DIAGNOSIS — Z720 Tobacco use: Secondary | ICD-10-CM | POA: Insufficient documentation

## 2014-05-04 DIAGNOSIS — I252 Old myocardial infarction: Secondary | ICD-10-CM | POA: Insufficient documentation

## 2014-05-04 DIAGNOSIS — I472 Ventricular tachycardia: Secondary | ICD-10-CM | POA: Insufficient documentation

## 2014-05-04 DIAGNOSIS — R0789 Other chest pain: Secondary | ICD-10-CM | POA: Insufficient documentation

## 2014-05-04 DIAGNOSIS — R072 Precordial pain: Secondary | ICD-10-CM

## 2014-05-04 DIAGNOSIS — Z7902 Long term (current) use of antithrombotics/antiplatelets: Secondary | ICD-10-CM | POA: Insufficient documentation

## 2014-05-04 DIAGNOSIS — Z79899 Other long term (current) drug therapy: Secondary | ICD-10-CM | POA: Insufficient documentation

## 2014-05-04 DIAGNOSIS — R61 Generalized hyperhidrosis: Secondary | ICD-10-CM | POA: Insufficient documentation

## 2014-05-04 DIAGNOSIS — I25119 Atherosclerotic heart disease of native coronary artery with unspecified angina pectoris: Principal | ICD-10-CM | POA: Insufficient documentation

## 2014-05-04 DIAGNOSIS — I255 Ischemic cardiomyopathy: Secondary | ICD-10-CM | POA: Insufficient documentation

## 2014-05-04 DIAGNOSIS — I10 Essential (primary) hypertension: Secondary | ICD-10-CM | POA: Insufficient documentation

## 2014-05-04 DIAGNOSIS — Z7982 Long term (current) use of aspirin: Secondary | ICD-10-CM | POA: Insufficient documentation

## 2014-05-04 DIAGNOSIS — I5022 Chronic systolic (congestive) heart failure: Secondary | ICD-10-CM | POA: Insufficient documentation

## 2014-05-04 DIAGNOSIS — R0602 Shortness of breath: Secondary | ICD-10-CM | POA: Insufficient documentation

## 2014-05-04 DIAGNOSIS — I209 Angina pectoris, unspecified: Secondary | ICD-10-CM

## 2014-05-04 DIAGNOSIS — M549 Dorsalgia, unspecified: Secondary | ICD-10-CM | POA: Insufficient documentation

## 2014-05-04 DIAGNOSIS — R011 Cardiac murmur, unspecified: Secondary | ICD-10-CM | POA: Insufficient documentation

## 2014-05-04 DIAGNOSIS — Z9889 Other specified postprocedural states: Secondary | ICD-10-CM | POA: Insufficient documentation

## 2014-05-04 DIAGNOSIS — R4701 Aphasia: Secondary | ICD-10-CM | POA: Diagnosis present

## 2014-05-04 DIAGNOSIS — F419 Anxiety disorder, unspecified: Secondary | ICD-10-CM | POA: Insufficient documentation

## 2014-05-04 DIAGNOSIS — E785 Hyperlipidemia, unspecified: Secondary | ICD-10-CM | POA: Insufficient documentation

## 2014-05-04 LAB — CBC WITH DIFFERENTIAL/PLATELET
BASOS PCT: 1 % (ref 0–1)
Basophils Absolute: 0 10*3/uL (ref 0.0–0.1)
EOS ABS: 0.1 10*3/uL (ref 0.0–0.7)
EOS PCT: 2 % (ref 0–5)
HEMATOCRIT: 40.1 % (ref 39.0–52.0)
Hemoglobin: 13.6 g/dL (ref 13.0–17.0)
LYMPHS ABS: 1.7 10*3/uL (ref 0.7–4.0)
Lymphocytes Relative: 26 % (ref 12–46)
MCH: 30.6 pg (ref 26.0–34.0)
MCHC: 33.9 g/dL (ref 30.0–36.0)
MCV: 90.1 fL (ref 78.0–100.0)
MONO ABS: 0.5 10*3/uL (ref 0.1–1.0)
Monocytes Relative: 7 % (ref 3–12)
Neutro Abs: 4.2 10*3/uL (ref 1.7–7.7)
Neutrophils Relative %: 64 % (ref 43–77)
PLATELETS: 231 10*3/uL (ref 150–400)
RBC: 4.45 MIL/uL (ref 4.22–5.81)
RDW: 13.7 % (ref 11.5–15.5)
WBC: 6.5 10*3/uL (ref 4.0–10.5)

## 2014-05-04 LAB — BASIC METABOLIC PANEL
ANION GAP: 8 (ref 5–15)
BUN: 9 mg/dL (ref 6–23)
CALCIUM: 8.9 mg/dL (ref 8.4–10.5)
CO2: 26 mmol/L (ref 19–32)
Chloride: 105 mmol/L (ref 96–112)
Creatinine, Ser: 1.16 mg/dL (ref 0.50–1.35)
GFR calc Af Amer: 77 mL/min — ABNORMAL LOW (ref >90)
GFR calc non Af Amer: 66 mL/min — ABNORMAL LOW (ref >90)
Glucose, Bld: 87 mg/dL (ref 70–99)
Potassium: 4 mmol/L (ref 3.5–5.1)
SODIUM: 139 mmol/L (ref 135–145)

## 2014-05-04 LAB — TROPONIN I
TROPONIN I: 0.03 ng/mL (ref <0.031)
Troponin I: <0.03 ng/mL (ref <0.031)

## 2014-05-04 LAB — I-STAT TROPONIN, ED: TROPONIN I, POC: 0.01 ng/mL (ref 0.00–0.08)

## 2014-05-04 LAB — HEPARIN LEVEL (UNFRACTIONATED): Heparin Unfractionated: 0.25 IU/mL — ABNORMAL LOW (ref 0.30–0.70)

## 2014-05-04 MED ORDER — CLOPIDOGREL BISULFATE 75 MG PO TABS
75.0000 mg | ORAL_TABLET | Freq: Every day | ORAL | Status: DC
  Administered 2014-05-04 – 2014-05-05 (×2): 75 mg via ORAL
  Filled 2014-05-04 (×2): qty 1

## 2014-05-04 MED ORDER — FUROSEMIDE 20 MG PO TABS
20.0000 mg | ORAL_TABLET | Freq: Every morning | ORAL | Status: DC
  Administered 2014-05-05: 20 mg via ORAL
  Filled 2014-05-04: qty 1

## 2014-05-04 MED ORDER — CARVEDILOL 12.5 MG PO TABS
12.5000 mg | ORAL_TABLET | Freq: Every day | ORAL | Status: DC
Start: 2014-05-04 — End: 2014-05-05
  Administered 2014-05-04 – 2014-05-05 (×2): 12.5 mg via ORAL
  Filled 2014-05-04 (×2): qty 1

## 2014-05-04 MED ORDER — NITROPRUSSIDE SODIUM 25 MG/ML IV SOLN: 0.0000 ug/kg/min | Freq: Once | INTRAVENOUS | Status: DC

## 2014-05-04 MED ORDER — HEPARIN (PORCINE) IN NACL 100-0.45 UNIT/ML-% IJ SOLN
1400.0000 [IU]/h | INTRAMUSCULAR | Status: DC
  Administered 2014-05-04: 1200 [IU]/h via INTRAVENOUS
  Administered 2014-05-05: 1400 [IU]/h via INTRAVENOUS
  Filled 2014-05-04 (×2): qty 250

## 2014-05-04 MED ORDER — ACETAMINOPHEN 325 MG PO TABS: 650.0000 mg | ORAL_TABLET | ORAL | Status: DC | PRN

## 2014-05-04 MED ORDER — NITROGLYCERIN IN D5W 200-5 MCG/ML-% IV SOLN
0.0000 ug/min | Freq: Once | INTRAVENOUS | Status: AC
  Administered 2014-05-04: 5 ug/min via INTRAVENOUS
  Filled 2014-05-04: qty 250

## 2014-05-04 MED ORDER — NITROGLYCERIN 0.4 MG SL SUBL
0.4000 mg | SUBLINGUAL_TABLET | SUBLINGUAL | Status: DC | PRN
  Administered 2014-05-04: 0.4 mg via SUBLINGUAL
  Filled 2014-05-04: qty 1

## 2014-05-04 MED ORDER — ALPRAZOLAM 0.25 MG PO TABS
0.2500 mg | ORAL_TABLET | Freq: Four times a day (QID) | ORAL | Status: DC | PRN
  Administered 2014-05-04: 0.25 mg via ORAL
  Filled 2014-05-04: qty 1

## 2014-05-04 MED ORDER — ATORVASTATIN CALCIUM 40 MG PO TABS
40.0000 mg | ORAL_TABLET | Freq: Every day | ORAL | Status: DC
  Administered 2014-05-04: 40 mg via ORAL
  Filled 2014-05-04: qty 1

## 2014-05-04 MED ORDER — HEPARIN BOLUS VIA INFUSION
4000.0000 [IU] | Freq: Once | INTRAVENOUS | Status: AC
  Administered 2014-05-04: 4000 [IU] via INTRAVENOUS
  Filled 2014-05-04: qty 4000

## 2014-05-04 MED ORDER — ONDANSETRON HCL 4 MG/2ML IJ SOLN: 4.0000 mg | Freq: Four times a day (QID) | INTRAMUSCULAR | Status: DC | PRN

## 2014-05-04 MED ORDER — LISINOPRIL 20 MG PO TABS
20.0000 mg | ORAL_TABLET | Freq: Every day | ORAL | Status: DC
  Administered 2014-05-05: 20 mg via ORAL
  Filled 2014-05-04: qty 1

## 2014-05-04 MED ORDER — ASPIRIN EC 81 MG PO TBEC
81.0000 mg | DELAYED_RELEASE_TABLET | Freq: Every day | ORAL | Status: DC
  Administered 2014-05-05: 81 mg via ORAL
  Filled 2014-05-04: qty 1

## 2014-05-04 NOTE — ED Notes (Signed)
Pt. Is from home. Complaint of new onset CP, mid sternal sharp shooting 3/10 pain radiation to L arm. Accompanied by N/diaphoresis/back pain/weakness. Hx of MI, has pacer/ICD. Given 324 ASA, 1 Nitro. Pain 2/10 now.

## 2014-05-04 NOTE — H&P (Signed)
Patient ID: [name] MRN: [id number], DOB/AGE: 61-Jan-1957   Admit date: 05/04/2014   Primary Physician: Pcp Not In System Primary Cardiologist: Dr. Jens Som Primary Electrophysiologist: Dr. Graciela Husbands  Pt. Profile:  59 year old male with past medical history of hypertension, hyperlipidemia, history of rheumatic fever, CAD status post multiple stents, ischemic cardiomyopathy and chronic systolic heart failure with EF 25% based on last cath in 2014 presented with CP at rest  Problem List  Past Medical History  Diagnosis Date  . HTN (hypertension)   . HLD (hyperlipidemia) mixed  . Ischemic cardiomyopathy     a.  EF 30-35% 2010;  b.  EF 20-25% by LV gram 08/2012.  [name] CAD (coronary artery disease)     a.  Severe LAD stenosis 2/2 acute thrombus - BMS 2009;  b. 06/01/11 Cath - LAD 20 isr, 66m (3.0x16 Veri-flex BMS), OM1 100p, EF 20-25%;  c. 07/2012 Abnl Cardiolite;  d. 08/2012 Cath/PCI: LM nl, LAD patent stents, D2 80-90 (jailed), LCX 90 p/m (4.0x24 Promus Premier DES), OM1 100, OM2 80-90p (3.0x20 Promus Premier DES), RCA patent mid stent, 40d, EF 25%.  . Anxiety   . Paroxysmal ventricular tachycardia     a. VFlutter  CL 210 msec  Rx shock 04/2011  . Systolic CHF, chronic   . Automatic implantable cardioverter-defibrillator in situ   . Heart murmur     "as a child" (08/29/2012)  . History of rheumatic fever 1962  . Anginal pain     "only w/my MI" (08/29/2012)  . Myocardial infarction 1998; 2002; ~ 2010    Past Surgical History  Procedure Laterality Date  . Defibrillator implantation  04/12/02    primary prevention for sudden death; Dr. Graciela Husbands   . Cardiac defibrillator placement      primary prevention for sudden death; Dr. Graciela Husbands   . Cardiac catheterization      "lots of those" (08/29/2012)  . Coronary angioplasty with stent placement  2002-08/29/2012    "think I had 4 total then 2 today" to LAD 06/01/2011/notes (08/29/2012)  . Laparoscopic cholecystectomy    . Hernia repair      "w/gallbladder  OR" (08/29/2012)  . Percutaneous coronary stent intervention (pci-s) N/A 06/01/2011    Procedure: PERCUTANEOUS CORONARY STENT INTERVENTION (PCI-S);  Surgeon: Peter M Swaziland, MD;  Location: [facility] CATH LAB;  Service: Cardiovascular;  Laterality: N/A;  . Left heart catheterization with coronary angiogram N/A 06/08/2011    Procedure: LEFT HEART CATHETERIZATION WITH CORONARY ANGIOGRAM;  Surgeon: Dolores Patty, MD;  Location[facility] CATH LAB;  Service: Cardiovascular;  Laterality: N/A;  . Percutaneous coronary stent intervention (pci-s) N/A 08/29/2012    Procedure: PERCUTANEOUS CORONARY STENT INTERVENTION (PCI-S);  Surgeon: Peter M Swaziland, MD;  Location[facility] CATH LAB;  Service: Cardiovascular;  Laterality: N/A;     Allergies  No Known Allergies  HPI  The patient is a 59 year old male with past medical history of hypertension, hyperlipidemia, history of rheumatic fever, CAD status post multiple stents, ischemic cardiomyopathy and chronic systolic heart failure with EF 25% based on last cath in 2014. It appears patient had a high risk nuclear stress test in 2014 and underwent cardiac catheterization in July 2014 which showed patent stents in LAD, 80-90% stenosis jailed D2, DES to 90% proximal and mid left circumflex, 100% OM1, 80-90% proximal OM 2 treated with DES, patent mid RCA stent, EF 25%. His last follow-up with Dr. Ludwig Clarks PA was on 09/13/2012, at which time he was doing okay. He was subsequently lost  to follow-up. It appears patient's last shock was in 2013, he has not been shocked by his Medtronic ICD since. He has been followed by Dr. Graciela Husbands. The last EP follow-up was 12/14/2013 during which visit, patient's device was interrogated, this showed multiple episode of nonsustained VT, no change was made. Patient has been doing well since that time. Unfortunately, according to the patient, he has not taken his medication at home for at least 6 months. He denies any recent heart failure symptoms, lower extremity  edema, orthopnea or paroxysmal nocturnal dyspnea.  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 He decided to seek medical attention at [facility]. He denies any exacerbating factors such as deep inspiration or palpation, he was given aspirin and sublingual nitroglycerin with some degree of improvement. 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 Chest x-ray is negative. Troponin negative 1. Cardiology has been consulted for chest pain.  Home Medications  Prior to Admission medications   Medication Sig Start Date End Date Taking? Authorizing Provider  aspirin EC 81 MG tablet Take 81 mg by mouth daily.   Yes Historical Provider, MD  carvedilol (COREG) 12.5 MG tablet Take 1 tablet (12.5 mg total) by mouth daily. Take  1 tablets  daily with meals 10/05/13  Yes [name], MD  clopidogrel (PLAVIX) 75 MG tablet Take 1 tablet (75 mg total) by mouth daily. Take one table by mouth everyday with breakfast. 09/27/13  Yes [name], MD  furosemide (LASIX) 20 MG tablet Take 1 tablet (20 mg total) by mouth every morning. 09/26/13  Yes [name], MD  lisinopril (PRINIVIL,ZESTRIL) 20 MG tablet Take 20 mg by mouth daily.   Yes Historical Provider, MD  nitroGLYCERIN (NITROSTAT) 0.4 MG SL tablet Place 1 tablet (0.4 mg total) under the tongue every 5 (five) minutes as needed. For chest pain 08/29/12  Yes [name], MD  simvastatin (ZOCOR) 80 MG tablet Take 80 mg by mouth at bedtime.   Yes Historical Provider, MD    Family History  Family History  Problem Relation Age of Onset  . Cancer      family hx  . Cancer Father   . Cancer Sister     twin sister and only one has cancer, unknown what kind  . Hypertension Brother     Social History  History   Social History  . Marital Status: Married    Spouse Name: N/A  . Number of Children:  N/A  . Years of Education: N/A   Occupational History  . Not on file.   Social History Main Topics  . Smoking status: Current Every Day Smoker -- 0.50 packs/day for 44 years    Types: Cigarettes    Last Attempt to Quit: 05/27/2011  . Smokeless tobacco: Never Used  . Alcohol Use: No     Comment: 08/29/2012 "6-8 beers once/wk". Has not had alcohol in the last 6-8 months  . Drug Use: No  . Sexual Activity: Not Currently   Other Topics Concern  . Not on file   Social History Narrative   Married; full time.      Review of Systems General:  No chills, fever, night sweats or weight changes.  Cardiovascular:  No dyspnea on exertion, edema, orthopnea, palpitations, paroxysmal nocturnal dyspnea. +chest pain Dermatological: No rash, lesions/masses Respiratory: No cough, dyspnea Urologic: No hematuria, dysuria  Abdominal:   No nausea, vomiting, diarrhea, bright red blood per rectum, melena, or hematemesis Neurologic:  No visual changes, wkns, changes in mental status. All other systems reviewed and are otherwise negative except as noted above.  Physical Exam  Blood pressure 115/58, pulse 86, temperature 98.4 F (36.9 C), temperature source Oral, resp. rate 20, height 5\' 11"  (1.803 m), weight 220 lb (99.791 kg), SpO2 99 %.  General: Pleasant, NAD Psych: Normal affect. Neuro: Alert and oriented X 3. Moves all extremities spontaneously. HEENT: Normal  Neck: Supple without bruits or JVD. Lungs:  Resp regular and unlabored, CTA. Heart: RRR no s3, s4, or murmurs. Abdomen: Soft, non-tender, non-distended, BS + x 4.  Extremities: No clubbing, cyanosis or edema. DP/PT/Radials 2+ and equal bilaterally.  Labs  Troponin (Point of Care Test)  Recent Labs  05/04/14 1058  TROPIPOC 0.01   No results for input(s): CKTOTAL, CKMB, TROPONINI in the last 72 hours. Lab Results  Component Value Date   WBC 6.5 05/04/2014   HGB 13.6 05/04/2014   HCT 40.1 05/04/2014   MCV 90.1 05/04/2014   PLT  231 05/04/2014     Recent Labs Lab 05/04/14 1050  NA 139  K 4.0  CL 105  CO2 26  BUN 9  CREATININE 1.16  CALCIUM 8.9  GLUCOSE 87   Lab Results  Component Value Date   CHOL 243* 02/04/2010   HDL 42.30 02/04/2010   LDLCALC * 02/25/2008    105        Total Cholesterol/HDL:CHD Risk Coronary Heart Disease Risk Table                     Men   Women  1/2 Average Risk   3.4   3.3  Average Risk       5.0   4.4  2 X Average Risk   9.6   7.1  3 X Average Risk  23.4   11.0        Use the calculated Patient Ratio above and the CHD Risk Table to determine the patient's CHD Risk.        ATP III CLASSIFICATION (LDL):  <100     mg/dL   Optimal  [id number]  mg/dL   Near or Above                    Optimal  130-159  mg/dL   Borderline  [id number]  mg/dL   High  >811>190     mg/dL   Very High   TRIG [id number]* 02/04/2010   No results found for: DDIMER   Radiology/Studies  Dg Chest Portable 1 View  05/04/2014   CLINICAL DATA:  Chest pain and shortness of breath  EXAM: PORTABLE CHEST - 1 VIEW  COMPARISON:  06/07/2011  FINDINGS: A defibrillator is again seen and stable. The cardiac shadow is stable as well. The lungs are clear. No bony abnormality is seen.  IMPRESSION: No active disease.   Electronically Signed   By: Alcide CleverMark  Lukens M.D.   On: 05/04/2014 11:15    ECG  Normal sinus rhythm with right bundle branch block and left anterior fascicular block with PVCs  Echocardiogram 05/02/2007  LEFT VENTRICLE: - The left ventricle was mildly dilated. - Overall left ventricular systolic function was severely reduced. - Left ventricular ejection fraction was estimated , range being 20    % to 25 %. - There was akinesis of the anteroseptal wall. - There was akinesis of the  periapical wall. - Left ventricular wall thickness was at the upper limits of    normal.     ASSESSMENT AND PLAN  1. Chest pain at rest  - per patient reminiscent of previous angina, however he has stopped  all of his medication including lasix for at least 6 month  - discussed with Dr. Swaziland, no urgent need for cardiac catheterization, will obtain serial trop. If negative, would continue his home medication. He need to demonstrate compliance with his medication first  - d/c IV nitro as patient is now CP free and having headache   2. CAD status post multiple stents 3. ischemic cardiomyopathy s/p medtronic ICD 4. chronic systolic heart failure with EF 25% based on last cath in 2014  - suprisingly euvolemic given he has not taken lasix in a long time  - potentially check echo either during this admission vs once he's back on his home med 5. Hypertension 6. Hyperlipidemia 7. history of rheumatic fever   Signed, Azalee Course, PA-C 05/04/2014, 1:48 PM  Patient seen and examined and history reviewed. Agree with above findings and plan. 59 yo WM with extensive cardiac history as noted presents to ED for evaluation of chest pain. Pain is midsteral and started at work today. Similar to prior cardiac pain. No SOB or increased edema. Exam reveals unkempt male in NAD. Lungs clear. No gallop or murmur. No edema. CXR is clear. Ecg shows a chronic RBBB. Troponin is normal. Of note the patient has not taken any of his medication for the last 6 months. He continues to smoke. He was seen in EP clinic in October but hasn't had followup with his primary cardiologist in 4 years. Given his prior history I would recommend overnight observation to rule out MI. Since he has been noncompliant with medical therapy I would recommend resuming prior cardiac meds including ASA, beta blocker, statin, and ACEi. If he rules out I would not work up further and DC in am. I would be reluctant to pursue more aggressive work up with cardiac cath or even stress testing at this point given his noncompliance. If he continues to have signficant angina despite good medical therapy then we could reconsider.   Peter Swaziland, MDFACC 05/04/2014 2:33  PM

## 2014-05-04 NOTE — Progress Notes (Signed)
ANTICOAGULATION CONSULT NOTE - Follow Up Consult  Pharmacy Consult for heparin Indication: chest pain/ACS  No Known Allergies  Patient Measurements: Height: 5\' 11"  (180.3 cm) Weight: 220 lb (99.791 kg) IBW/kg (Calculated) : 75.3 Heparin Dosing Weight: ~ 85kg  Vital Signs: Temp: 98.4 F (36.9 C) (03/11 1716) Temp Source: Oral (03/11 1716) BP: 124/68 mmHg (03/11 1716) Pulse Rate: 91 (03/11 1716)  Labs:  Recent Labs  05/04/14 1050 05/04/14 1700 05/04/14 1813  HGB 13.6  --   --   HCT 40.1  --   --   PLT 231  --   --   HEPARINUNFRC  --   --  0.25*  CREATININE 1.16  --   --   TROPONINI  --  0.03  --     Estimated Creatinine Clearance: 80.5 mL/min (by C-G formula based on Cr of 1.16).   Medications:  Scheduled:  . aspirin EC  81 mg Oral Daily  . atorvastatin  40 mg Oral q1800  . carvedilol  12.5 mg Oral Daily  . clopidogrel  75 mg Oral Daily  . [START ON 05/05/2014] furosemide  20 mg Oral q morning - 10a  . [START ON 05/05/2014] lisinopril  20 mg Oral Daily   Infusions:  . heparin 1,200 Units/hr (05/04/14 1237)    Assessment: 59 yo M in ED with new onset chest pain with N/diaphoreis/back pain/weakness. Pharmacy consulted to dose heparin for ACS.  The initial heparin level is 0.25 and below goal.  Goal of Therapy:  Heparin level 0.3-0.7 units/ml Monitor platelets by anticoagulation protocol: Yes   Plan:   -Increase heparin to 1400 units/hr -Will recheck a heparin level with CBC in am  Harland GermanAndrew Arel Tippen, Pharm D 05/04/2014 7:16 PM

## 2014-05-04 NOTE — ED Provider Notes (Signed)
CSN: [id number]     Arrival date & time 05/04/14  1034 History   First MD Initiated Contact with Patient 05/04/14 1037     Chief Complaint  Patient presents with  . Chest Pain     (Consider location/radiation/quality/duration/timing/severity/associated sxs/prior Treatment) HPI  This is a 59 year old male with a history of hypertension, hyperlipidemia, ischemic cardiomyopathy, coronary artery disease with multiple stents, CHF who presents with chest pain. Patient reports onset of midsternal sharp chest pain at approximately 8 AM this morning. He was operating heavy equipment. Currently his chest pain is 2 out of 10.  It is improved with nitroglycerin at home. He states that the pain radiates into his left arm. He has associated diaphoresis and shortness of breath. States that he feels this is similar to prior heart attacks.  He took a full dose aspirin prior to arrival.  Past Medical History  Diagnosis Date  . HTN (hypertension)   . HLD (hyperlipidemia) mixed  . Ischemic cardiomyopathy     a.  EF 30-35% 2010;  b.  EF 20-25% by LV gram 08/2012.  [name]. CAD (coronary artery disease)     a.  Severe LAD stenosis 2/2 acute thrombus - BMS 2009;  b. 06/01/11 Cath - LAD 20 isr, 5330m (3.0x16 Veri-flex BMS), OM1 100p, EF 20-25%;  c. 07/2012 Abnl Cardiolite;  d. 08/2012 Cath/PCI: LM nl, LAD patent stents, D2 80-90 (jailed), LCX 90 p/m (4.0x24 Promus Premier DES), OM1 100, OM2 80-90p (3.0x20 Promus Premier DES), RCA patent mid stent, 40d, EF 25%.  . Anxiety   . Paroxysmal ventricular tachycardia     a. VFlutter  CL 210 msec  Rx shock 04/2011  . Systolic CHF, chronic   . Automatic implantable cardioverter-defibrillator in situ   . Heart murmur     "as a child" (08/29/2012)  . History of rheumatic fever 1962  . Anginal pain     "only w/my MI" (08/29/2012)  . Myocardial infarction 1998; 2002; ~ 2010   Past Surgical History  Procedure Laterality Date  . Defibrillator implantation  04/12/02    primary prevention  for sudden death; Dr. Graciela HusbandsKlein   . Cardiac defibrillator placement      primary prevention for sudden death; Dr. Graciela HusbandsKlein   . Cardiac catheterization      "lots of those" (08/29/2012)  . Coronary angioplasty with stent placement  2002-08/29/2012    "think I had 4 total then 2 today" to LAD 06/01/2011/notes (08/29/2012)  . Laparoscopic cholecystectomy    . Hernia repair      "w/gallbladder OR" (08/29/2012)  . Percutaneous coronary stent intervention (pci-s) N/A 06/01/2011    Procedure: PERCUTANEOUS CORONARY STENT INTERVENTION (PCI-S);  Surgeon: Peter M SwazilandJordan, MD;  [facility];  Service: Cardiovascular;  Laterality: N/A;  . Left heart catheterization with coronary angiogram N/A 06/08/2011    Procedure: LEFT HEART CATHETERIZATION WITH CORONARY ANGIOGRAM;  Surgeon: Dolores Pattyaniel R Bensimhon, MD;  [facility];  Service: Cardiovascular;  Laterality: N/A;  . Percutaneous coronary stent intervention (pci-s) N/A 08/29/2012    Procedure: PERCUTANEOUS CORONARY STENT INTERVENTION (PCI-S);  Surgeon: Peter M SwazilandJordan, MD;  [facility];  Service: Cardiovascular;  Laterality: N/A;   Family History  Problem Relation Age of Onset  . Cancer      family hx  . Cancer Father   . Cancer Sister     twin sister and only one has cancer, unknown what kind  . Hypertension Brother    History  Substance Use  Topics  . Smoking status: Current Every Day Smoker -- 0.50 packs/day for 44 years    Types: Cigarettes    Last Attempt to Quit: 05/27/2011  . Smokeless tobacco: Never Used  . Alcohol Use: No     Comment: 08/29/2012 "6-8 beers once/wk". Has not had alcohol in the last 6-8 months    Review of Systems  Constitutional: Positive for diaphoresis. Negative for fever.  Respiratory: Positive for chest tightness and shortness of breath.   Cardiovascular: Negative.  Negative for chest pain and leg swelling.  Gastrointestinal: Negative.  Negative for abdominal pain.  Genitourinary: Negative.  Negative for dysuria.   Musculoskeletal: Positive for back pain.  Neurological: Negative for headaches.  All other systems reviewed and are negative.     Allergies  Review of patient's allergies indicates no known allergies.  Home Medications   Prior to Admission medications   Medication Sig Start Date End Date Taking? Authorizing Provider  aspirin EC 81 MG tablet Take 81 mg by mouth daily.   Yes Historical Provider, MD  carvedilol (COREG) 12.5 MG tablet Take 1 tablet (12.5 mg total) by mouth daily. Take  1 tablets  daily with meals 10/05/13  Yes [name], MD  clopidogrel (PLAVIX) 75 MG tablet Take 1 tablet (75 mg total) by mouth daily. Take one table by mouth everyday with breakfast. 09/27/13  Yes [name], MD  furosemide (LASIX) 20 MG tablet Take 1 tablet (20 mg total) by mouth every morning. 09/26/13  Yes [name], MD  lisinopril (PRINIVIL,ZESTRIL) 20 MG tablet Take 20 mg by mouth daily.   Yes Historical Provider, MD  nitroGLYCERIN (NITROSTAT) 0.4 MG SL tablet Place 1 tablet (0.4 mg total) under the tongue every 5 (five) minutes as needed. For chest pain 08/29/12  Yes [name], MD  simvastatin (ZOCOR) 80 MG tablet Take 80 mg by mouth at bedtime.   Yes Historical Provider, MD   BP 115/58 mmHg  Pulse 86  Temp(Src) 98.4 F (36.9 C) (Oral)  Resp 20  Ht  (1.803 m)  Wt 220 lb (99.791 kg)  BMI 30.70 kg/m2  SpO2 99% Physical Exam  Constitutional: He is oriented to person, place, and time.  Diaphoretic  HENT:  Head: Normocephalic and atraumatic.  Eyes: Pupils are equal, round, and reactive to light.  Cardiovascular: Normal rate, regular rhythm and normal heart sounds.   No murmur heard. Pulmonary/Chest: Effort normal and breath sounds normal. No respiratory distress. He has no wheezes.  Abdominal: Soft. Bowel sounds are normal. There is no tenderness. There is no rebound.  Musculoskeletal: He exhibits edema.  trace  Neurological: He is alert and oriented to person, place, and  time.  Skin: Skin is warm and dry.  Psychiatric: He has a normal mood and affect.  Nursing note and vitals reviewed.   ED Course  Procedures (including critical care time)  CRITICAL CARE Performed by: [name]   Total critical care time: 35 min  Critical care time was exclusive of separately billable procedures and treating other patients.  Critical care was necessary to treat or prevent imminent or life-threatening deterioration.  Critical care was time spent personally by me on the following activities: development of treatment plan with patient and/or surrogate as well as nursing, discussions with consultants, evaluation of patient's response to treatment, examination of patient, obtaining history from patient or surrogate, ordering and performing treatments and interventions, ordering and review of laboratory studies, ordering and review of radiographic studies,  pulse oximetry and re-evaluation of patient's condition.  Labs Review Labs Reviewed  CBC WITH DIFFERENTIAL/PLATELET  BASIC METABOLIC PANEL  HEPARIN LEVEL (UNFRACTIONATED)  Rosezena Sensor, ED    Imaging Review Dg Chest Portable 1 View  05/04/2014   CLINICAL DATA:  Chest pain and shortness of breath  EXAM: PORTABLE CHEST - 1 VIEW  COMPARISON:  06/07/2011  FINDINGS: A defibrillator is again seen and stable. The cardiac shadow is stable as well. The lungs are clear. No bony abnormality is seen.  IMPRESSION: No active disease.   Electronically Signed   By: Alcide Clever M.D.   On: 05/04/2014 11:15     EKG Interpretation   Date/Time:  Friday May 04 2014 10:34:15 EST Ventricular Rate:  89 PR Interval:  193 QRS Duration: 156 QT Interval:  426 QTC Calculation: 518 R Axis:   -80 Text Interpretation:  Sinus rhythm Paired ventricular premature complexes  Consider right atrial enlargement RBBB and LAFB Probable left ventricular  hypertrophy Anterior Q waves, possibly due to LVH seen on prior Confirmed  by  Silas Muff  MD, Osie Merkin ([id number]) on 05/04/2014 10:37:36 AM      MDM   Final diagnoses:  Acute angina    Patient presents for chest pain. Extensive cardiac history. Full dose aspirin prior to arrival. Appears diaphoretic but otherwise is nontoxic and vital signs are reassuring. Pain improved with nitroglycerin. High suspicion at this time for angina. Patient given more nitroglycerin with improvement of pain to 1 out of 10. EKG shows no acute ST elevation but does show frequent PVCs.  Chest x-ray reassuring. Patient was started on heparin and nitroglycerin. Cardiology consulted for admission.   [name], MD 05/04/14 1158

## 2014-05-04 NOTE — Progress Notes (Signed)
ANTICOAGULATION CONSULT NOTE - Initial Consult  Pharmacy Consult for heparin Indication: chest pain/ACS  No Known Allergies  Patient Measurements: Height: 5\' 11"  (180.3 cm) Weight: 220 lb (99.791 kg) IBW/kg (Calculated) : 75.3 Heparin Dosing Weight: ~ 85 kg  Vital Signs: Temp: 98.4 F (36.9 C) (03/11 1040) Temp Source: Oral (03/11 1040) BP: 108/70 mmHg (03/11 1056) Pulse Rate: 93 (03/11 1056)  Labs: No results for input(s): HGB, HCT, PLT, APTT, LABPROT, INR, HEPARINUNFRC, CREATININE, CKTOTAL, CKMB, TROPONINI in the last 72 hours.  CrCl cannot be calculated (Patient has no serum creatinine result on file.).   Medical History: Past Medical History  Diagnosis Date  . HTN (hypertension)   . HLD (hyperlipidemia) mixed  . Ischemic cardiomyopathy     a.  EF 30-35% 2010;  b.  EF 20-25% by LV gram 08/2012.  [name]. CAD (coronary artery disease)     a.  Severe LAD stenosis 2/2 acute thrombus - BMS 2009;  b. 06/01/11 Cath - LAD 20 isr, 2853m (3.0x16 Veri-flex BMS), OM1 100p, EF 20-25%;  c. 07/2012 Abnl Cardiolite;  d. 08/2012 Cath/PCI: LM nl, LAD patent stents, D2 80-90 (jailed), LCX 90 p/m (4.0x24 Promus Premier DES), OM1 100, OM2 80-90p (3.0x20 Promus Premier DES), RCA patent mid stent, 40d, EF 25%.  . Anxiety   . Paroxysmal ventricular tachycardia     a. VFlutter  CL 210 msec  Rx shock 04/2011  . Systolic CHF, chronic   . Automatic implantable cardioverter-defibrillator in situ   . Heart murmur     "as a child" (08/29/2012)  . History of rheumatic fever 1962  . Anginal pain     "only w/my MI" (08/29/2012)  . Myocardial infarction 1998; 2002; ~ 2010    Medications:  No anticoagulants  Assessment: 59 yo M in ED with new onset chest pain with N/diaphoreis/back pain/weakness.  Pharmacy consulted to dose heparin for ACS.  Wt 98.8 kg. First troponin negative. CBC and BMET in progress.  Goal of Therapy:  Heparin level 0.3-0.7 units/ml Monitor platelets by anticoagulation protocol: Yes    Plan:  Give 4000 units bolus x 1 Start heparin infusion at 1200 units/hr Check anti-Xa level in 6 hours and daily while on heparin Continue to monitor H&H and platelets  Herby AbrahamMichelle T. Zachery Niswander, Pharm.D. [phone] 05/04/2014 11:32 AM

## 2014-05-04 NOTE — ED Notes (Signed)
Portable Xray at bedside.

## 2014-05-05 DIAGNOSIS — R079 Chest pain, unspecified: Secondary | ICD-10-CM

## 2014-05-05 LAB — TROPONIN I

## 2014-05-05 LAB — HEPARIN LEVEL (UNFRACTIONATED): Heparin Unfractionated: 0.34 IU/mL (ref 0.30–0.70)

## 2014-05-05 LAB — LIPID PANEL
Cholesterol: 146 mg/dL (ref 0–200)
HDL: 46 mg/dL (ref >39)
LDL Cholesterol: 85 mg/dL (ref 0–99)
TRIGLYCERIDES: 75 mg/dL (ref <150)
Total CHOL/HDL Ratio: 3.2 RATIO
VLDL: 15 mg/dL (ref 0–40)

## 2014-05-05 LAB — BASIC METABOLIC PANEL
ANION GAP: 7 (ref 5–15)
BUN: 11 mg/dL (ref 6–23)
CHLORIDE: 109 mmol/L (ref 96–112)
CO2: 24 mmol/L (ref 19–32)
Calcium: 8.7 mg/dL (ref 8.4–10.5)
Creatinine, Ser: 1.03 mg/dL (ref 0.50–1.35)
GFR calc Af Amer: 89 mL/min — ABNORMAL LOW (ref >90)
GFR calc non Af Amer: 76 mL/min — ABNORMAL LOW (ref >90)
GLUCOSE: 104 mg/dL — AB (ref 70–99)
POTASSIUM: 4.2 mmol/L (ref 3.5–5.1)
SODIUM: 140 mmol/L (ref 135–145)

## 2014-05-05 LAB — TSH: TSH: 1.158 u[IU]/mL (ref 0.350–4.500)

## 2014-05-05 MED ORDER — CARVEDILOL 12.5 MG PO TABS: 12.5000 mg | ORAL_TABLET | Freq: Every day | ORAL | Status: DC

## 2014-05-05 MED ORDER — FUROSEMIDE 20 MG PO TABS: 20.0000 mg | ORAL_TABLET | Freq: Every morning | ORAL | Status: DC

## 2014-05-05 MED ORDER — ATORVASTATIN CALCIUM 40 MG PO TABS: 40.0000 mg | ORAL_TABLET | Freq: Every day | ORAL | Status: DC

## 2014-05-05 MED ORDER — LISINOPRIL 20 MG PO TABS: 20.0000 mg | ORAL_TABLET | Freq: Every day | ORAL | Status: DC

## 2014-05-05 MED ORDER — CLOPIDOGREL BISULFATE 75 MG PO TABS: 75.0000 mg | ORAL_TABLET | Freq: Every day | ORAL | Status: DC

## 2014-05-05 MED ORDER — NITROGLYCERIN 0.4 MG SL SUBL: 0.4000 mg | SUBLINGUAL_TABLET | SUBLINGUAL | Status: DC | PRN

## 2014-05-05 NOTE — Progress Notes (Signed)
UR completed 

## 2014-05-05 NOTE — Progress Notes (Signed)
PHARMACY NOTE  Pharmacy Consult :  59 y.o. male is currently on Heparin infusion for R/O ACS.   Heparin Dosing Wt :  85 kg  Hematology :  Recent Labs  05/04/14 1050 05/04/14 1813 05/05/14 0440  HGB 13.6  --   --   HCT 40.1  --   --   PLT 231  --   --   HEPARINUNFRC  --  0.25* 0.34  CREATININE 1.16  --  1.03    Current Medication[s] Include: Medication PTA: Prescriptions prior to admission  Medication Sig Dispense Refill Last Dose  . aspirin EC 81 MG tablet Take 81 mg by mouth daily.   05/04/2014 at Unknown time  . carvedilol (COREG) 12.5 MG tablet Take 1 tablet (12.5 mg total) by mouth daily. Take  1 tablets  daily with meals 30 tablet 1 unknown at Unknown time  . clopidogrel (PLAVIX) 75 MG tablet Take 1 tablet (75 mg total) by mouth daily. Take one table by mouth everyday with breakfast. 30 tablet 1 unknown at Unknown time  . furosemide (LASIX) 20 MG tablet Take 1 tablet (20 mg total) by mouth every morning. 30 tablet 3 unknown at Unknown time  . lisinopril (PRINIVIL,ZESTRIL) 20 MG tablet Take 20 mg by mouth daily.   unknown at Unknown time  . nitroGLYCERIN (NITROSTAT) 0.4 MG SL tablet Place 1 tablet (0.4 mg total) under the tongue every 5 (five) minutes as needed. For chest pain 25 tablet 3 05/04/2014 at Unknown time  . simvastatin (ZOCOR) 80 MG tablet Take 80 mg by mouth at bedtime.   unknown at Unknown time   Scheduled:  Scheduled:  . aspirin EC  81 mg Oral Daily  . atorvastatin  40 mg Oral q1800  . carvedilol  12.5 mg Oral Daily  . clopidogrel  75 mg Oral Daily  . furosemide  20 mg Oral q morning - 10a  . lisinopril  20 mg Oral Daily   Infusion[s]: Infusions:  . heparin 1,400 Units/hr (05/05/14 0500)   Assessment :  Heparin infusing at 1400 units/hr.    Heparin level 0.32 units/ml.  No evidence of bleeding complications observed.  Goal :  Heparin Level  0.3 - 0.7 units/ml  Plan : 1. Heparin will be continued for now at the  same rate.   The next Heparin Level with AM labs if discharge home delayed/postponed. 2. Daily Heparin level, CBC while on Heparin.  Monitor for bleeding complications. Follow Platelet counts.  Velda ShellEarle Alayna Mabe, Pharm.D. 05/05/2014  8:29 AM

## 2014-05-05 NOTE — Discharge Summary (Signed)
Physician Discharge Summary     Cardiologist: Crenshaw/Klein  Patient ID: [name] MRN: [id number] DOB/AGE: 09/11/1955 59 y.o.  Admit date: 05/04/2014 Discharge date: 05/05/2014  Admission Diagnoses:  Chest pain  Discharge Diagnoses:  Active Problems:   Chest pain   Coronary artery disease   Congestive heart failure   AICD   Hyperlipidemia   Hypertension   Tobacco abuse   Discharged Condition: stable  Hospital Course:   The patient is a 59 year old male with past medical history of hypertension, hyperlipidemia, history of rheumatic fever, CAD status post multiple stents, ischemic cardiomyopathy and chronic systolic heart failure with EF 25% based on last cath in 2014. It appears patient had a high risk nuclear stress test in 2014 and underwent cardiac catheterization in July 2014 which showed patent stents in LAD, 80-90% stenosis jailed D2, DES to 90% proximal and mid left circumflex, 100% OM1, 80-90% proximal OM 2 treated with DES, patent mid RCA stent, EF 25%. His last follow-up with Dr. Ludwig Clarks PA was on 09/13/2012, at which time he was doing okay. He was subsequently lost to follow-up. It appears patient's last shock was in 2013, he has not been shocked by his [employer] ICD since. He has been followed by Dr. Graciela Husbands. The last EP follow-up was 12/14/2013 during which visit, patient's device was interrogated, this showed multiple episode of nonsustained VT, no change was made. Patient has been doing well since that time. Unfortunately, according to the patient, he has not taken his medication at home for at least 6 months. He denies any recent heart failure symptoms, lower extremity edema, orthopnea or paroxysmal nocturnal dyspnea.  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 He decided to seek medical attention at [facility]. He denies any exacerbating factors such  as deep inspiration or palpation, he was given aspirin and sublingual nitroglycerin with some degree of improvement. 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 Chest x-ray is negative. Troponin negative 1. Cardiology has been consulted for chest pain.  Patient was admitted for observation.  Patient ruled out for MI.  The following medications were resumed:ASA, beta blocker, statin, and ACEi.  Lipid panel looks surprisingly good.  He'll be continued on 20 mg of Lasix daily.  Tobacco cessation counseling was given.  The patient was seen by Dr. Eden Emms who felt he was stable for DC home.   Consults: None  Significant Diagnostic Studies:   PORTABLE CHEST - 1 VIEW  COMPARISON: 06/07/2011  FINDINGS: A defibrillator is again seen and stable. The cardiac shadow is stable as well. The lungs are clear. No bony abnormality is seen.  IMPRESSION: No active disease.  BMET    Component Value Date/Time   NA 140 05/05/2014 0440   K 4.2 05/05/2014 0440   CL 109 05/05/2014 0440   CO2 24 05/05/2014 0440   GLUCOSE 104* 05/05/2014 0440   BUN 11 05/05/2014 0440   CREATININE 1.03 05/05/2014 0440   CREATININE 1.22 05/29/2011 1644   CALCIUM 8.7 05/05/2014 0440   GFRNONAA 76* 05/05/2014 0440   GFRNONAA 65 05/29/2011 1644   GFRAA 89* 05/05/2014 0440   GFRAA 75 05/29/2011 1644    CBC    Component Value Date/Time   WBC 6.5 05/04/2014 1050   RBC 4.45 05/04/2014 1050   HGB 13.6 05/04/2014 1050   HCT 40.1 05/04/2014 1050   PLT 231 05/04/2014 1050  MCV 90.1 05/04/2014 1050   MCH 30.6 05/04/2014 1050   MCHC 33.9 05/04/2014 1050   RDW 13.7 05/04/2014 1050   LYMPHSABS 1.7 05/04/2014 1050   MONOABS 0.5 05/04/2014 1050   EOSABS 0.1 05/04/2014 1050   BASOSABS 0.0 05/04/2014 1050    Lipid Panel     Component Value Date/Time   CHOL 146 05/05/2014 0440   TRIG 75 05/05/2014 0440   HDL 46 05/05/2014 0440   CHOLHDL 3.2 05/05/2014 0440   VLDL 15 05/05/2014  0440   LDLCALC 85 05/05/2014 0440   LDLDIRECT 159.2 02/04/2010 1622      Treatments: See above  Discharge Exam: Blood pressure 102/75, pulse 75, temperature 97.6 F (36.4 C), temperature source Oral, resp. rate 20, height 5\' 11"  (1.803 m), weight 220 lb (99.791 kg), SpO2 95 %.   Disposition: 01-Home or Self Care      Discharge Instructions    Diet - low sodium heart healthy    Complete by:  As directed      Increase activity slowly    Complete by:  As directed             Medication List    STOP taking these medications        simvastatin 80 MG tablet  Commonly known as:  ZOCOR  Replaced by:  atorvastatin 40 MG tablet      TAKE these medications        aspirin EC 81 MG tablet  Take 81 mg by mouth daily.     atorvastatin 40 MG tablet  Commonly known as:  LIPITOR  Take 1 tablet (40 mg total) by mouth daily at 6 PM.     carvedilol 12.5 MG tablet  Commonly known as:  COREG  Take 1 tablet (12.5 mg total) by mouth daily. Take  1 tablets  daily with meals     clopidogrel 75 MG tablet  Commonly known as:  PLAVIX  Take 1 tablet (75 mg total) by mouth daily. Take one table by mouth everyday with breakfast.     furosemide 20 MG tablet  Commonly known as:  LASIX  Take 1 tablet (20 mg total) by mouth every morning.     lisinopril 20 MG tablet  Commonly known as:  PRINIVIL,ZESTRIL  Take 1 tablet (20 mg total) by mouth daily.     nitroGLYCERIN 0.4 MG SL tablet  Commonly known as:  NITROSTAT  Place 1 tablet (0.4 mg total) under the tongue every 5 (five) minutes as needed. For chest pain       Follow-up Information    Follow up with Olga MillersBrian Crenshaw, MD.   Specialty:  Cardiology   Why:  office will call you with the follow-up appointment date and time   Contact information:   [address] [place] [address] [phone]      Greater than 30 minutes was spent completing the patient's discharge.    SignedWilburt Finlay: Mattia Osterman, PAC 05/05/2014, 9:22  AM

## 2014-05-05 NOTE — Progress Notes (Signed)
Patient ID: [name], male   DOB: 02/13/1956, 59 y.o.   MRN: [id number]    Subjective:  Denies SSCP, palpitations or Dyspnea Wants to go home Willing to be compliant with meds   Objective:  Filed Vitals:   05/04/14 2000 05/04/14 2028 05/04/14 2037 05/05/14 0508  BP: 110/56 84/42 110/56 102/75  Pulse: 86   75  Temp: 98 F (36.7 C)   97.6 F (36.4 C)  TempSrc: Oral   Oral  Resp:      Height:    5\' 11"  (1.803 m)  Weight:    99.791 kg (220 lb)  SpO2: 97%   95%    Intake/Output from previous day: No intake or output data in the 24 hours ending 05/05/14 0801  Physical Exam: Affect appropriate Healthy:  appears stated age HEENT: normal Neck supple with no adenopathy JVP normal no bruits no thyromegaly Lungs clear with no wheezing and good diaphragmatic motion Heart:  S1/S2 no murmur, no rub, gallop or click PMI  Enlarged AICD under left clavicle  Abdomen: benighn, BS positve, no tenderness, no AAA no bruit.  No HSM or HJR Distal pulses intact with no bruits No edema Neuro non-focal Skin warm and dry No muscular weakness   Lab Results: Basic Metabolic Panel:  Recent Labs  40/98/1102/01/08 1050 05/05/14 0440  NA 139 140  K 4.0 4.2  CL 105 109  CO2 26 24  GLUCOSE 87 104*  BUN 9 11  CREATININE 1.16 1.03  CALCIUM 8.9 8.7   CBC:  Recent Labs  05/04/14 1050  WBC 6.5  NEUTROABS 4.2  HGB 13.6  HCT 40.1  MCV 90.1  PLT 231   Cardiac Enzymes:  Recent Labs  05/04/14 1700 05/04/14 2250 05/05/14 0440  TROPONINI 0.03 <0.03 <0.03   Fasting Lipid Panel:  Recent Labs  05/05/14 0440  CHOL 146  HDL 46  LDLCALC 85  TRIG 75  CHOLHDL 3.2   Thyroid Function Tests:  Recent Labs  05/05/14 0440  TSH 1.158    Imaging: Dg Chest Portable 1 View  05/04/2014   CLINICAL DATA:  Chest pain and shortness of breath  EXAM: PORTABLE CHEST - 1 VIEW  COMPARISON:  06/07/2011  FINDINGS: A defibrillator is again seen and stable. The cardiac shadow is stable as well. The  lungs are clear. No bony abnormality is seen.  IMPRESSION: No active disease.   Electronically Signed   By: Alcide CleverMark  Lukens M.D.   On: 05/04/2014 11:15    Cardiac Studies:  ECG:   SR no acute ST changes   Telemetry:  NSR no arrhythmia 05/05/2014   Echo:   Medications:   . aspirin EC  81 mg Oral Daily  . atorvastatin  40 mg Oral q1800  . carvedilol  12.5 mg Oral Daily  . clopidogrel  75 mg Oral Daily  . furosemide  20 mg Oral q morning - 10a  . lisinopril  20 mg Oral Daily     . heparin 1,400 Units/hr (05/05/14 0500)    Assessment/Plan:  CAD:  Previous stents to RCA and LAD with large anterior MI and ischemic DCM  Non compliant with meds See note from Dr SwazilandJordan D/C today with medical compliance Outpatient f/u Dr Jens Somrenshaw CHF:  euvolemic continue lasix 20 mg no AICD d/c AICD:  Normal functoin   Dillon Bjorketer Ovidio Steele Mckenley Birenbaum 05/05/2014, 8:01 AM

## 2014-05-05 NOTE — Progress Notes (Signed)
Patient ambulated about 600 ft in hallway. No c/o pain, SOB or fatigue.  Will continue to monitor.

## 2014-05-07 ENCOUNTER — Telehealth: Payer: Self-pay | Admitting: Cardiology

## 2014-05-07 LAB — HEMOGLOBIN A1C
HEMOGLOBIN A1C: 5.4 % (ref 4.8–5.6)
Mean Plasma Glucose: 108 mg/dL

## 2014-05-07 NOTE — Telephone Encounter (Signed)
Closed encounter °

## 2014-06-05 ENCOUNTER — Ambulatory Visit: Payer: Self-pay | Admitting: Cardiology

## 2014-07-09 ENCOUNTER — Encounter: Payer: Self-pay | Admitting: Physician Assistant

## 2014-08-05 NOTE — Progress Notes (Signed)
Cardiology Office Note   Date:  08/05/2014   ID:  [name], DOB [birth year], MRN [id number]  PCP:  Pcp Not In System  Cardiologist:  Dr. Olga Millers   Electrophysiologist:  Dr. Sherryl Manges   No chief complaint on file.    History of Present Illness: [name] is a 59 y.o. male with a hx of    Studies/Reports Reviewed Today:  LHC 08/23/12 Left mainstem: Normal  Left anterior descending (LAD): The LAD has extensive stents from the proximal to mid vessel. These stents are widely patent. The second diagonal is jailed with 80-90% ostial stenosis.  Left circumflex (LCx): The circumflex is a very large vessel. There is severe disease in the proximal and mid vessel with tandem 90% stenoses. The first OM is small and occluded. The second OM is a large branch and has 80-90% stenosis at the takeoff and proximal vessel that is involved with the main circumflex lesion.   Right coronary artery (RCA): The RCA arises inferiorly. The stent in the mid vessel is widely patent. There is 40% disease at the crux.  Left ventriculography: Left ventricular systolic function is abnormal. There is extensive akinesis involving the mid to distal anterior wall, distal inferior wall, and apex. Overall LV function is severely reduced with EF of 25%.  Final Conclusions:  1. Severe single vessel obstructive CAD with complex bifurcation LCX/OM2 disease. The stents in the LAD and RCA are patent. Chronic jailing of the second diagonal. 2. Severe LV dysfunction.  Recommendations: Will consider the patient for high risk PCI of the LCX/ OM disease.  PCI 08/29/12 Lesion Data: Vessel: Proximal to mid left circumflex/OM 2 bifurcation stenosis. Percent stenosis (pre): 90% in each branch TIMI-flow (pre): 3 Stent: 3.0 x 20 mm Promus premier for the OM 2 and 4.0 x 24 mm Promus premier for the left circumflex. Percent stenosis (post): 0% TIMI-flow (post): 3  Conclusions: Successful bifurcation stenting of  the native left circumflex and second obtuse marginal branch using a mini crush technique with drug-eluting stents.  Recommendations: Dual antiplatelet therapy with aspirin and Plavix indefinitely  Myoview 08/12/12 High risk stress nuclear study. There is a large fixed defect involving apex, apical anterior and mid-anterior segments and apical septal segments. There is a small fixed defect involving the basalinferolateral and midinferolateral segments. No significant reversibility. There is severe LV dysfunction with marked LV enlargement and apical akinesis. EF is 26%  LV Ejection Fraction: 26%. LV Wall Motion: Severe LV systolic dysfunction with global hypokinesis and apical akinesis.  Echo 05/02/07 - The left ventricle was mildly dilated. Overall left ventricular    systolic function was severely reduced. Left ventricular    ejection fraction was estimated , range being 20 % to 25 %.    There was akinesis of the anteroseptal wall. There was    akinesis of the periapical wall. Left ventricular wall    thickness was at the upper limits of normal. Features were    consistent with mild diastolic dysfunction. - There was mild aortic valvular regurgitation. The aortic    regurgitation jet was central. - The aortic root was at the upper limits of normal in size. - There was mild mitral valvular regurgitation. - The left atrium was mildly dilated.  Past Medical History  Diagnosis Date  . HTN (hypertension)   . HLD (hyperlipidemia) mixed  . Ischemic cardiomyopathy     a.  EF 30-35% 2010;  b.  EF 20-25% by LV gram 08/2012.  [name]  CAD (coronary artery disease)     a.  Severe LAD stenosis 2/2 acute thrombus - BMS 2009;  b. 06/01/11 Cath - LAD 20 isr, 66m (3.0x16 Veri-flex BMS), OM1 100p, EF 20-25%;  c. 07/2012 Abnl Cardiolite;  d. 08/2012 Cath/PCI: LM nl, LAD patent stents, D2 80-90 (jailed), LCX 90 p/m (4.0x24 Promus Premier DES), OM1 100, OM2 80-90p (3.0x20 Promus  Premier DES), RCA patent mid stent, 40d, EF 25%.  . Anxiety   . Paroxysmal ventricular tachycardia     a. VFlutter  CL 210 msec  Rx shock 04/2011  . Systolic CHF, chronic   . Automatic implantable cardioverter-defibrillator in situ   . Heart murmur     "as a child" (08/29/2012)  . History of rheumatic fever 1962  . Anginal pain     "only w/my MI" (08/29/2012)  . Myocardial infarction 1998; 2002; ~ 2010    Past Surgical History  Procedure Laterality Date  . Defibrillator implantation  04/12/02    primary prevention for sudden death; Dr. Graciela Husbands   . Cardiac defibrillator placement      primary prevention for sudden death; Dr. Graciela Husbands   . Cardiac catheterization      "lots of those" (08/29/2012)  . Coronary angioplasty with stent placement  2002-08/29/2012    "think I had 4 total then 2 today" to LAD 06/01/2011/notes (08/29/2012)  . Laparoscopic cholecystectomy    . Hernia repair      "w/gallbladder OR" (08/29/2012)  . Percutaneous coronary stent intervention (pci-s) N/A 06/01/2011    Procedure: PERCUTANEOUS CORONARY STENT INTERVENTION (PCI-S);  Surgeon: Peter M Swaziland, MD;  Location: [facility] CATH LAB;  Service: Cardiovascular;  Laterality: N/A;  . Left heart catheterization with coronary angiogram N/A 06/08/2011    Procedure: LEFT HEART CATHETERIZATION WITH CORONARY ANGIOGRAM;  Surgeon: Dolores Patty, MD;  Location: [facility] CATH LAB;  Service: Cardiovascular;  Laterality: N/A;  . Percutaneous coronary stent intervention (pci-s) N/A 08/29/2012    Procedure: PERCUTANEOUS CORONARY STENT INTERVENTION (PCI-S);  Surgeon: Peter M Swaziland, MD;  Location: [facility] CATH LAB;  Service: Cardiovascular;  Laterality: N/A;     Current Outpatient Prescriptions  Medication Sig Dispense Refill  . aspirin EC 81 MG tablet Take 81 mg by mouth daily.    [name] atorvastatin (LIPITOR) 40 MG tablet Take 1 tablet (40 mg total) by mouth daily at 6 PM. 30 tablet 11  . carvedilol (COREG) 12.5 MG tablet Take 1 tablet (12.5 mg total) by mouth daily.  Take  1 tablets  daily with meals 30 tablet 11  . clopidogrel (PLAVIX) 75 MG tablet Take 1 tablet (75 mg total) by mouth daily. Take one table by mouth everyday with breakfast. 30 tablet 11  . furosemide (LASIX) 20 MG tablet Take 1 tablet (20 mg total) by mouth every morning. 30 tablet 11  . lisinopril (PRINIVIL,ZESTRIL) 20 MG tablet Take 1 tablet (20 mg total) by mouth daily. 30 tablet 11  . nitroGLYCERIN (NITROSTAT) 0.4 MG SL tablet Place 1 tablet (0.4 mg total) under the tongue every 5 (five) minutes as needed. For chest pain 25 tablet 11   No current facility-administered medications for this visit.    Allergies:   Review of patient's allergies indicates no known allergies.    Social History:  The patient  reports that he has been smoking Cigarettes.  He has a 22 pack-year smoking history. He has never used smokeless tobacco. He reports that he does not drink alcohol or use illicit drugs.   Family History:  The patient's family history includes Cancer in his father, sister, and another family member; Hypertension in his brother.    ROS:   Please see the history of present illness.   ROS    PHYSICAL EXAM: VS:  There were no vitals taken for this visit.    Wt Readings from Last 3 Encounters:  05/05/14 220 lb (99.791 kg)  12/14/13 220 lb 6.4 oz (99.973 kg)  09/13/12 241 lb (109.317 kg)     GEN: Well nourished, well developed, in no acute distress HEENT: normal Neck: no JVD, no carotid bruits, no masses Cardiac:  Normal S1/S2, RRR; no murmur ,  no rubs or gallops, no edema  Respiratory:  clear to auscultation bilaterally, no wheezing, rhonchi or rales. GI: soft, nontender, nondistended, + BS MS: no deformity or atrophy Skin: warm and dry  Neuro:  CNs II-XII intact, Strength and sensation are intact Psych: Normal affect   EKG:  EKG is ordered today.  It demonstrates:      Recent Labs: 05/04/2014: Hemoglobin 13.6; Platelets 231 05/05/2014: BUN 11; Creatinine, Ser 1.03;  Potassium 4.2; Sodium 140; TSH 1.158    Lipid Panel    Component Value Date/Time   CHOL 146 05/05/2014 0440   TRIG 75 05/05/2014 0440   HDL 46 05/05/2014 0440   CHOLHDL 3.2 05/05/2014 0440   VLDL 15 05/05/2014 0440   LDLCALC 85 05/05/2014 0440   LDLDIRECT 159.2 02/04/2010 1622      ASSESSMENT AND PLAN:  No diagnosis found.     Current medicines are reviewed at length with the patient today.  Concerns regarding medicines are as outlined above.  The following changes have been made:    As above  Labs/ tests ordered today include:   No orders of the defined types were placed in this encounter.     Disposition:   FU with    Signed, Brynda Rim, MHS 08/05/2014 11:07 PM    [facility] [address] [place], [place], [place]  [address] Phone: [phone]; Fax: [phone]    This encounter was created in error - please disregard.

## 2014-08-06 ENCOUNTER — Encounter: Payer: Self-pay | Admitting: Physician Assistant

## 2014-08-28 NOTE — Progress Notes (Signed)
Cardiology Office Note   Date:  08/28/2014   ID:  [name], DOB [birth year], MRN [id number]  PCP:  Pcp Not In System  Cardiologist:  Dr. Sherryl Manges     No chief complaint on file.    History of Present Illness: INDALECIO MALMSTROM is a 59 y.o. male with a hx of   Admitted 3/11-3/12 Hospital Course:  The patient is a 59 year old male with past medical history of hypertension, hyperlipidemia, history of rheumatic fever, CAD status post multiple stents, ischemic cardiomyopathy and chronic systolic heart failure with EF 25% based on last cath in 2014. It appears patient had a high risk nuclear stress test in 2014 and underwent cardiac catheterization in July 2014 which showed patent stents in LAD, 80-90% stenosis jailed D2, DES to 90% proximal and mid left circumflex, 100% OM1, 80-90% proximal OM 2 treated with DES, patent mid RCA stent, EF 25%. His last follow-up with Dr. Ludwig Clarks PA was on 09/13/2012, at which time he was doing okay. He was subsequently lost to follow-up. It appears patient's last shock was in 2013, he has not been shocked by his Medtronic ICD since. He has been followed by Dr. Graciela Husbands. The last EP follow-up was 12/14/2013 during which visit, patient's device was interrogated, this showed multiple episode of nonsustained VT, no change was made. Patient has been doing well since that time. Unfortunately, according to the patient, he has not taken his medication at home for at least 6 months. He denies any recent heart failure symptoms, lower extremity edema, orthopnea or paroxysmal nocturnal dyspnea. 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 He decided to seek medical attention at [facility]. He denies any exacerbating factors such as deep inspiration or palpation, he was given aspirin and sublingual nitroglycerin with some degree of  improvement. 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 Chest x-ray is negative. Troponin negative 1. Cardiology has been consulted for chest pain. Patient was admitted for observation. Patient ruled out for MI. The following medications were resumed:ASA, beta blocker, statin, and ACEi. Lipid panel looks surprisingly good. He'll be continued on 20 mg of Lasix daily. Tobacco cessation counseling was given. The patient was seen by Dr. Eden Emms who felt he was stable for DC home.     Studies/Reports Reviewed Today:  Myoview 08/12/12 High risk stress nuclear study. There is a large fixed defect involving apex, apical anterior and mid-anterior segments and apical septal segments. There is a small fixed defect involving the basalinferolateral and midinferolateral segments. No significant reversibility. There is severe LV dysfunction with marked LV enlargement and apical akinesis. EF is 26%  LV Ejection Fraction: 26%. LV Wall Motion: Severe LV systolic dysfunction with global hypokinesis and apical akinesis.  LHC 08/23/12 Left mainstem: Normal Left anterior descending (LAD): The LAD has extensive stents from the proximal to mid vessel. These stents are widely patent. The second diagonal is jailed with 80-90% ostial stenosis. Left circumflex (LCx): The circumflex is a very large vessel. There is severe disease in the proximal and mid vessel with tandem 90% stenoses. The first OM is small and occluded. The second OM is a large branch and has 80-90% stenosis at the takeoff and proximal vessel that is involved with the main circumflex lesion.  Right coronary artery (RCA): The RCA arises inferiorly. The stent in the mid vessel is widely patent. There is  40% disease at the crux. Left ventriculography: Left ventricular systolic function is abnormal. There is extensive akinesis involving the mid to distal anterior wall, distal inferior wall, and apex. Overall  LV function is severely reduced with EF of 25%. Final Conclusions:  1. Severe single vessel obstructive CAD with complex bifurcation LCX/OM2 disease. The stents in the LAD and RCA are patent. Chronic jailing of the second diagonal. 2. Severe LV dysfunction.  PCI 08/29/12 Lesion Data: Vessel: Proximal to mid left circumflex/OM 2 bifurcation stenosis. Percent stenosis (pre): 90% in each branch TIMI-flow (pre): 3 Stent: 3.0 x 20 mm Promus premier for the OM 2 and 4.0 x 24 mm Promus premier for the left circumflex. Percent stenosis (post): 0% TIMI-flow (post): 3  Conclusions: Successful bifurcation stenting of the native left circumflex and second obtuse marginal branch using a mini crush technique with drug-eluting stents.  Past Medical History  Diagnosis Date  . HTN (hypertension)   . HLD (hyperlipidemia) mixed  . Ischemic cardiomyopathy     a.  EF 30-35% 2010;  b.  EF 20-25% by LV gram 08/2012.  [name] CAD (coronary artery disease)     a.  Severe LAD stenosis 2/2 acute thrombus - BMS 2009;  b. 06/01/11 Cath - LAD 20 isr, 24m (3.0x16 Veri-flex BMS), OM1 100p, EF 20-25%;  c. 07/2012 Abnl Cardiolite;  d. 08/2012 Cath/PCI: LM nl, LAD patent stents, D2 80-90 (jailed), LCX 90 p/m (4.0x24 Promus Premier DES), OM1 100, OM2 80-90p (3.0x20 Promus Premier DES), RCA patent mid stent, 40d, EF 25%.  . Anxiety   . Paroxysmal ventricular tachycardia     a. VFlutter  CL 210 msec  Rx shock 04/2011  . Systolic CHF, chronic   . Automatic implantable cardioverter-defibrillator in situ   . Heart murmur     "as a child" (08/29/2012)  . History of rheumatic fever 1962  . Anginal pain     "only w/my MI" (08/29/2012)  . Myocardial infarction 1998; 2002; ~ 2010    Past Surgical History  Procedure Laterality Date  . Defibrillator implantation  04/12/02    primary prevention for sudden death; Dr. Graciela Husbands   . Cardiac defibrillator placement      primary prevention for sudden death; Dr. Graciela Husbands   . Cardiac catheterization       "lots of those" (08/29/2012)  . Coronary angioplasty with stent placement  2002-08/29/2012    "think I had 4 total then 2 today" to LAD 06/01/2011/notes (08/29/2012)  . Laparoscopic cholecystectomy    . Hernia repair      "w/gallbladder OR" (08/29/2012)  . Percutaneous coronary stent intervention (pci-s) N/A 06/01/2011    Procedure: PERCUTANEOUS CORONARY STENT INTERVENTION (PCI-S);  Surgeon: Peter M Swaziland, MD;  Location: [facility] CATH LAB;  Service: Cardiovascular;  Laterality: N/A;  . Left heart catheterization with coronary angiogram N/A 06/08/2011    Procedure: LEFT HEART CATHETERIZATION WITH CORONARY ANGIOGRAM;  Surgeon: Dolores Patty, MD;  Location: [facility] CATH LAB;  Service: Cardiovascular;  Laterality: N/A;  . Percutaneous coronary stent intervention (pci-s) N/A 08/29/2012    Procedure: PERCUTANEOUS CORONARY STENT INTERVENTION (PCI-S);  Surgeon: Peter M Swaziland, MD;  Location: [facility] CATH LAB;  Service: Cardiovascular;  Laterality: N/A;     Current Outpatient Prescriptions  Medication Sig Dispense Refill  . aspirin EC 81 MG tablet Take 81 mg by mouth daily.    [name] atorvastatin (LIPITOR) 40 MG tablet Take 1 tablet (40 mg total) by mouth daily at 6 PM. 30 tablet 11  . carvedilol (  COREG) 12.5 MG tablet Take 1 tablet (12.5 mg total) by mouth daily. Take  1 tablets  daily with meals 30 tablet 11  . clopidogrel (PLAVIX) 75 MG tablet Take 1 tablet (75 mg total) by mouth daily. Take one table by mouth everyday with breakfast. 30 tablet 11  . furosemide (LASIX) 20 MG tablet Take 1 tablet (20 mg total) by mouth every morning. 30 tablet 11  . lisinopril (PRINIVIL,ZESTRIL) 20 MG tablet Take 1 tablet (20 mg total) by mouth daily. 30 tablet 11  . nitroGLYCERIN (NITROSTAT) 0.4 MG SL tablet Place 1 tablet (0.4 mg total) under the tongue every 5 (five) minutes as needed. For chest pain 25 tablet 11   No current facility-administered medications for this visit.    Allergies:   Review of patient's allergies indicates no  known allergies.    Social History:  The patient  reports that he has been smoking Cigarettes.  He has a 22 pack-year smoking history. He has never used smokeless tobacco. He reports that he does not drink alcohol or use illicit drugs.   Family History:  The patient's family history includes Cancer in his father, sister, and another family member; Hypertension in his brother.    ROS:   Please see the history of present illness.   ROS    PHYSICAL EXAM: VS:  There were no vitals taken for this visit.    Wt Readings from Last 3 Encounters:  05/05/14 220 lb (99.791 kg)  12/14/13 220 lb 6.4 oz (99.973 kg)  09/13/12 241 lb (109.317 kg)     GEN: Well nourished, well developed, in no acute distress HEENT: normal Neck: no JVD, no carotid bruits, no masses Cardiac:  Normal S1/S2, RRR; no murmur ,  no rubs or gallops, no edema  Respiratory:  clear to auscultation bilaterally, no wheezing, rhonchi or rales. GI: soft, nontender, nondistended, + BS MS: no deformity or atrophy Skin: warm and dry  Neuro:  CNs II-XII intact, Strength and sensation are intact Psych: Normal affect   EKG:  EKG is ordered today.  It demonstrates:      Recent Labs: 05/04/2014: Hemoglobin 13.6; Platelets 231 05/05/2014: BUN 11; Creatinine, Ser 1.03; Potassium 4.2; Sodium 140; TSH 1.158    Lipid Panel    Component Value Date/Time   CHOL 146 05/05/2014 0440   TRIG 75 05/05/2014 0440   HDL 46 05/05/2014 0440   CHOLHDL 3.2 05/05/2014 0440   VLDL 15 05/05/2014 0440   LDLCALC 85 05/05/2014 0440   LDLDIRECT 159.2 02/04/2010 1622      ASSESSMENT AND PLAN:  No diagnosis found.     Medication Changes: Current medicines are reviewed at length with the patient today.  Concerns regarding medicines are as outlined above.  The following changes have been made:   Discontinued Medications   No medications on file   Modified Medications   No medications on file   New Prescriptions   No medications on file       Labs/ tests ordered today include:   No orders of the defined types were placed in this encounter.     Disposition:   FU with    Signed, Tereso NewcomerScott Jaxsin Bottomley, PA-C, MHS 08/28/2014 10:17 PM    [facility] [address], [place], [place]  [address] Phone: [phone]; Fax: [phone]    This encounter was created in error - please disregard.

## 2014-08-29 ENCOUNTER — Encounter: Payer: Self-pay | Admitting: Physician Assistant

## 2014-09-28 ENCOUNTER — Encounter: Payer: Self-pay | Admitting: Internal Medicine

## 2014-10-03 ENCOUNTER — Ambulatory Visit: Payer: Self-pay | Admitting: Physician Assistant

## 2014-11-06 ENCOUNTER — Emergency Department (HOSPITAL_COMMUNITY)
Admission: EM | Admit: 2014-11-06 | Discharge: 2014-11-06 | Disposition: A | Discharge status: Home / Self Care | Payer: MEDICAID

## 2014-11-06 NOTE — ED Notes (Signed)
Pt left pre-triage 

## 2014-11-06 NOTE — ED Notes (Signed)
Pt called for triage room no response

## 2014-12-06 ENCOUNTER — Ambulatory Visit (INDEPENDENT_AMBULATORY_CARE_PROVIDER_SITE_OTHER): Payer: Self-pay | Admitting: Internal Medicine

## 2014-12-06 ENCOUNTER — Encounter: Payer: Self-pay | Admitting: Internal Medicine

## 2014-12-06 VITALS — BP 104/62 | HR 93 | Ht 71.0 in | Wt 205.6 lb

## 2014-12-06 DIAGNOSIS — I255 Ischemic cardiomyopathy: Secondary | ICD-10-CM

## 2014-12-06 NOTE — Progress Notes (Signed)
Patient Care Team: Pcp Not In System as PCP - General [name], MD as PCP - Cardiology (Cardiology) No Pcp Per Patient (General Practice)   HPI  [name] is a 59 y.o. male is seen in followup for ICD implanted as per the MASTER trial with prior myocardial infarction complicated by shock and prior stenting.  Underwent catheterization in 2010 demonstrated ejection fraction of 35% and severe LAD stenosis secondary to acute thrombus.   Because of ventricular tachycardia and exertional chest pain shortness of breath he underwent catheterization April 2012 demonstrating a new lesion in the mid LAD that was felt to be critical;  proximal mid LAD stents and mid RCA stents were patent. LVEF was 20-25%    Left heart cath 4/13 >>There is a long stented segment in the proximal to mid LAD is patent. There is diffuse 10-20% narrowing within the stent. The recently placed stent in the mid LAD is widely patent. It jails the ostium of the second diagonal branch with a 90% lesion. Theres is TIMI-3 in the ostium of the D2.  Left circumflex (LCx): A left circumflex is a large vessel. There is a 50-60% stenosis in the proximal vessel. He first obtuse marginal vessel is very small in caliber and is sub-totally occluded proximally. The second and third marginal branches are large vessels. There is 30% lesion in ostium of OM-2  Right coronary artery (RCA): The right coronary has an inferior take off. It is a codominant vessel. There is 40% narrowing at the ostium. A stent is noted in the mid right coronary and is widely patent with less than 10-20% irregularities. There is a 30-40% lesion in the distal right coronary. Stable CAD as described above. 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 He has been seen by mental health The patient denies chest pain, shortness of breath, nocturnal dyspnea, orthopnea or peripheral edema.  There have been no palpitations, lightheadedness or syncope.  xn  Myoview 6/14 had an ejection fraction of 25%    Past Medical History  Diagnosis Date  . HTN (hypertension)   . HLD (hyperlipidemia) mixed  . Ischemic cardiomyopathy     a.  EF 30-35% 2010;  b.  EF 20-25% by LV gram 08/2012.  [name] CAD (coronary artery disease)     a.  Severe LAD stenosis 2/2 acute thrombus - BMS 2009;  b. 06/01/11 Cath - LAD 20 isr, 46m (3.0x16 Veri-flex BMS), OM1 100p, EF 20-25%;  c. 07/2012 Abnl Cardiolite;  d. 08/2012 Cath/PCI: LM nl, LAD patent stents, D2 80-90 (jailed), LCX 90 p/m (4.0x24 Promus Premier DES), OM1 100, OM2 80-90p (3.0x20 Promus Premier DES), RCA patent mid stent, 40d, EF 25%.  . Anxiety   . Paroxysmal ventricular tachycardia (HCC)     a. VFlutter  CL 210 msec  Rx shock 04/2011  . Systolic CHF, chronic (HCC)   . Automatic implantable cardioverter-defibrillator in situ   . Heart murmur     "as a child" (08/29/2012)  . History of rheumatic fever 1962  . Anginal pain (HCC)     "only w/my MI" (08/29/2012)  . Myocardial infarction [facility]) 1998; 2002; ~ 2010    Past Surgical History  Procedure Laterality Date  .  Defibrillator implantation  04/12/02    primary prevention for sudden death; Dr. Graciela HusbandsKlein   . Cardiac defibrillator placement      primary prevention for sudden death; Dr. Graciela HusbandsKlein   . Cardiac catheterization      "lots of those" (08/29/2012)  . Coronary angioplasty with stent placement  2002-08/29/2012    "think I had 4 total then 2 today" to LAD 06/01/2011/notes (08/29/2012)  . Laparoscopic cholecystectomy    . Hernia repair      "w/gallbladder OR" (08/29/2012)  . Percutaneous coronary stent intervention (pci-s) N/A 06/01/2011    Procedure: PERCUTANEOUS CORONARY STENT INTERVENTION (PCI-S);  Surgeon: Peter M SwazilandJordan, MD;  [facility];  Service:  Cardiovascular;  Laterality: N/A;  . Left heart catheterization with coronary angiogram N/A 06/08/2011    Procedure: LEFT HEART CATHETERIZATION WITH CORONARY ANGIOGRAM;  Surgeon: Dolores Pattyaniel R Bensimhon, MD;  [facility];  Service: Cardiovascular;  Laterality: N/A;  . Percutaneous coronary stent intervention (pci-s) N/A 08/29/2012    Procedure: PERCUTANEOUS CORONARY STENT INTERVENTION (PCI-S);  Surgeon: Peter M SwazilandJordan, MD;  [facility];  Service: Cardiovascular;  Laterality: N/A;    Current Outpatient Prescriptions  Medication Sig Dispense Refill  . aspirin EC 81 MG tablet Take 81 mg by mouth daily.    [name]. atorvastatin (LIPITOR) 40 MG tablet Take 1 tablet (40 mg total) by mouth daily at 6 PM. 30 tablet 11  . carvedilol (COREG) 12.5 MG tablet Take 1 tablet (12.5 mg total) by mouth daily. Take  1 tablets  daily with meals 30 tablet 11  . clopidogrel (PLAVIX) 75 MG tablet Take 1 tablet (75 mg total) by mouth daily. Take one table by mouth everyday with breakfast. 30 tablet 11  . furosemide (LASIX) 20 MG tablet Take 1 tablet (20 mg total) by mouth every morning. 30 tablet 11  . lisinopril (PRINIVIL,ZESTRIL) 20 MG tablet Take 1 tablet (20 mg total) by mouth daily. 30 tablet 11  . nitroGLYCERIN (NITROSTAT) 0.4 MG SL tablet Place 1 tablet (0.4 mg total) under the tongue every 5 (five) minutes as needed. For chest pain 25 tablet 11   No current facility-administered medications for this visit.    No Known Allergies  Review of Systems negative except from HPI and PMH  Physical Exam BP 104/62 mmHg  Pulse 93  Ht 5\' 11"  (1.803 m)  Wt 205 lb 9.6 oz (93.26 kg)  BMI 28.69 kg/m2 Well developed and nourished in no acute distress HENT normal Neck supple with JVP-flat Clear Regular rate and rhythm, no murmurs or gallops Abd-soft with active BS No Clubbing cyanosis edema Skin-warm and dry A & Oriented  Grossly normal sensory and motor function tearful  ECG demonstrates sinus rhythm at  93 Intervals 19/16/41 Axis is leftward at -88 Right bundle branch block left axis deviation Septal MI   Assessment and  Plan  Ischemic cardiomyopathy Without symptoms of ischemia  Depression   Ventricular tachycardia No intercurrent Ventricular tachycardia  Implantable defibrillator -Medtronic  The patient's device was interrogated.  The information was reviewed. No changes were made in the programming.     Cardiac review things are stable.  We will try to arrange follow-up with Dr. Jens Somrenshaw.  We spent some time discussing his depression and suicide attempt. He assures me that he is better and has no plans.

## 2014-12-06 NOTE — Patient Instructions (Signed)
Medication Instructions: - no changes  Labwork: - none  Procedures/Testing: - none  Follow-Up: - Remote monitoring is used to monitor your Pacemaker of ICD from home. This monitoring reduces the number of office visits required to check your device to one time per year. It allows us to keep an eye on the functioning of your device to ensure it is working properly. You are scheduled for a device check from home on 03/07/15. You may send your transmission at any time that day. If you have a wireless device, the transmission will be sent automatically. After your physician reviews your transmission, you will receive a postcard with your next transmission date.  - Your physician wants you to follow-up in: 6 months with Dr. Jens Somrenshaw & 1 year with Dr. Graciela HusbandsKlein. You will receive a reminder letter in the mail two months in advance. If you don't receive a letter, please call our office to schedule the follow-up appointment.  Any Additional Special Instructions Will Be Listed Below (If Applicable).

## 2014-12-07 LAB — CUP PACEART INCLINIC DEVICE CHECK
Brady Statistic RV Percent Paced: 0 %
HIGH POWER IMPEDANCE MEASURED VALUE: 60 Ohm
HighPow Impedance: 47 Ohm
Lead Channel Impedance Value: 437 Ohm
Lead Channel Pacing Threshold Amplitude: 1.5 V
Lead Channel Pacing Threshold Pulse Width: 0.4 ms
Lead Channel Sensing Intrinsic Amplitude: 1.875 mV
Lead Channel Setting Pacing Amplitude: 3 V
Lead Channel Setting Pacing Pulse Width: 0.4 ms
Lead Channel Setting Sensing Sensitivity: 0.3 mV
MDC IDC LEAD IMPLANT DT: 20040218
MDC IDC LEAD LOCATION: 753860
MDC IDC MSMT BATTERY VOLTAGE: 2.82 V
MDC IDC SESS DTM: 20161013200857
MDC IDC SET ZONE DETECTION INTERVAL: 450 ms
Zone Setting Detection Interval: 300 ms

## 2014-12-08 ENCOUNTER — Encounter (HOSPITAL_COMMUNITY): Payer: Self-pay | Admitting: Emergency Medicine

## 2014-12-08 ENCOUNTER — Emergency Department (HOSPITAL_COMMUNITY): Payer: Self-pay

## 2014-12-08 ENCOUNTER — Observation Stay (HOSPITAL_COMMUNITY)
Admission: EM | Admit: 2014-12-08 | Discharge: 2014-12-12 | Disposition: A | Discharge status: Home / Self Care | Payer: Self-pay | Attending: Internal Medicine | Admitting: Internal Medicine

## 2014-12-08 DIAGNOSIS — Z720 Tobacco use: Secondary | ICD-10-CM | POA: Insufficient documentation

## 2014-12-08 DIAGNOSIS — R011 Cardiac murmur, unspecified: Secondary | ICD-10-CM | POA: Insufficient documentation

## 2014-12-08 DIAGNOSIS — Z79899 Other long term (current) drug therapy: Secondary | ICD-10-CM | POA: Insufficient documentation

## 2014-12-08 DIAGNOSIS — I209 Angina pectoris, unspecified: Secondary | ICD-10-CM | POA: Insufficient documentation

## 2014-12-08 DIAGNOSIS — F411 Generalized anxiety disorder: Secondary | ICD-10-CM

## 2014-12-08 DIAGNOSIS — G459 Transient cerebral ischemic attack, unspecified: Secondary | ICD-10-CM

## 2014-12-08 DIAGNOSIS — R079 Chest pain, unspecified: Principal | ICD-10-CM | POA: Insufficient documentation

## 2014-12-08 DIAGNOSIS — F329 Major depressive disorder, single episode, unspecified: Secondary | ICD-10-CM | POA: Insufficient documentation

## 2014-12-08 DIAGNOSIS — I252 Old myocardial infarction: Secondary | ICD-10-CM | POA: Insufficient documentation

## 2014-12-08 DIAGNOSIS — F17200 Nicotine dependence, unspecified, uncomplicated: Secondary | ICD-10-CM | POA: Diagnosis present

## 2014-12-08 DIAGNOSIS — I5022 Chronic systolic (congestive) heart failure: Secondary | ICD-10-CM | POA: Insufficient documentation

## 2014-12-08 DIAGNOSIS — I10 Essential (primary) hypertension: Secondary | ICD-10-CM | POA: Insufficient documentation

## 2014-12-08 DIAGNOSIS — E785 Hyperlipidemia, unspecified: Secondary | ICD-10-CM | POA: Insufficient documentation

## 2014-12-08 DIAGNOSIS — I255 Ischemic cardiomyopathy: Secondary | ICD-10-CM | POA: Insufficient documentation

## 2014-12-08 DIAGNOSIS — R4701 Aphasia: Secondary | ICD-10-CM | POA: Diagnosis present

## 2014-12-08 DIAGNOSIS — Z95810 Presence of automatic (implantable) cardiac defibrillator: Secondary | ICD-10-CM | POA: Insufficient documentation

## 2014-12-08 DIAGNOSIS — F419 Anxiety disorder, unspecified: Secondary | ICD-10-CM | POA: Insufficient documentation

## 2014-12-08 DIAGNOSIS — F339 Major depressive disorder, recurrent, unspecified: Secondary | ICD-10-CM

## 2014-12-08 DIAGNOSIS — I2510 Atherosclerotic heart disease of native coronary artery without angina pectoris: Secondary | ICD-10-CM | POA: Diagnosis present

## 2014-12-08 DIAGNOSIS — I4729 Other ventricular tachycardia: Secondary | ICD-10-CM

## 2014-12-08 DIAGNOSIS — Z7982 Long term (current) use of aspirin: Secondary | ICD-10-CM | POA: Insufficient documentation

## 2014-12-08 DIAGNOSIS — I472 Ventricular tachycardia: Secondary | ICD-10-CM | POA: Insufficient documentation

## 2014-12-08 HISTORY — DX: Depression, unspecified: F32.A

## 2014-12-08 HISTORY — DX: Major depressive disorder, single episode, unspecified: F32.9

## 2014-12-08 LAB — CBC WITH DIFFERENTIAL/PLATELET
Basophils Absolute: 0 10*3/uL (ref 0.0–0.1)
Basophils Relative: 0 %
EOS ABS: 0.1 10*3/uL (ref 0.0–0.7)
Eosinophils Relative: 1 %
HEMATOCRIT: 41.9 % (ref 39.0–52.0)
HEMOGLOBIN: 14.3 g/dL (ref 13.0–17.0)
LYMPHS ABS: 0.8 10*3/uL (ref 0.7–4.0)
Lymphocytes Relative: 11 %
MCH: 31 pg (ref 26.0–34.0)
MCHC: 34.1 g/dL (ref 30.0–36.0)
MCV: 90.7 fL (ref 78.0–100.0)
MONO ABS: 0.4 10*3/uL (ref 0.1–1.0)
MONOS PCT: 6 %
NEUTROS PCT: 82 %
Neutro Abs: 5.7 10*3/uL (ref 1.7–7.7)
Platelets: 189 10*3/uL (ref 150–400)
RBC: 4.62 MIL/uL (ref 4.22–5.81)
RDW: 13.4 % (ref 11.5–15.5)
WBC: 7 10*3/uL (ref 4.0–10.5)

## 2014-12-08 LAB — I-STAT TROPONIN, ED: Troponin i, poc: 0.02 ng/mL (ref 0.00–0.08)

## 2014-12-08 LAB — BASIC METABOLIC PANEL
Anion gap: 9 (ref 5–15)
BUN: 12 mg/dL (ref 6–20)
CO2: 25 mmol/L (ref 22–32)
CREATININE: 1.16 mg/dL (ref 0.61–1.24)
Calcium: 8.8 mg/dL — ABNORMAL LOW (ref 8.9–10.3)
Chloride: 100 mmol/L — ABNORMAL LOW (ref 101–111)
GFR calc Af Amer: >60 mL/min (ref >60)
GFR calc non Af Amer: >60 mL/min (ref >60)
Glucose, Bld: 144 mg/dL — ABNORMAL HIGH (ref 65–99)
Potassium: 3.7 mmol/L (ref 3.5–5.1)
Sodium: 134 mmol/L — ABNORMAL LOW (ref 135–145)

## 2014-12-08 NOTE — ED Provider Notes (Signed)
CSN: [id number]     Arrival date & time 12/08/14  2124 History   First MD Initiated Contact with Patient 12/08/14 2128     Chief Complaint  Patient presents with  . Chest Pain     (Consider location/radiation/quality/duration/timing/severity/associated sxs/prior Treatment) HPI Comments: Patient is a 59 year old male with a past medical history of ischemic cardiomyopathy, hyperlipidemia, hypertension, tobacco abuse, and unstable angina who presents with chest pain that started prior to arrival. Patient reports he was playing computer games when he had sudden onset of chest pain in the central chest without radiation. He describes the pain as a severe pressure. He reports associated difficulty word finding. He reports his pain has subsided after about 2 hours. No aggravating/alleviating factors.    Past Medical History  Diagnosis Date  . HTN (hypertension)   . HLD (hyperlipidemia) mixed  . Ischemic cardiomyopathy     a.  EF 30-35% 2010;  b.  EF 20-25% by LV gram 08/2012.  [name] CAD (coronary artery disease)     a.  Severe LAD stenosis 2/2 acute thrombus - BMS 2009;  b. 06/01/11 Cath - LAD 20 isr, 57m (3.0x16 Veri-flex BMS), OM1 100p, EF 20-25%;  c. 07/2012 Abnl Cardiolite;  d. 08/2012 Cath/PCI: LM nl, LAD patent stents, D2 80-90 (jailed), LCX 90 p/m (4.0x24 Promus Premier DES), OM1 100, OM2 80-90p (3.0x20 Promus Premier DES), RCA patent mid stent, 40d, EF 25%.  . Anxiety   . Paroxysmal ventricular tachycardia (HCC)     a. VFlutter  CL 210 msec  Rx shock 04/2011  . Systolic CHF, chronic (HCC)   . Automatic implantable cardioverter-defibrillator in situ   . Heart murmur     "as a child" (08/29/2012)  . History of rheumatic fever 1962  . Anginal pain (HCC)     "only w/my MI" (08/29/2012)  . Myocardial infarction [facility]) 1998; 2002; ~ 2010   Past Surgical History  Procedure Laterality Date  . Defibrillator implantation  04/12/02    primary prevention for sudden death; Dr. Graciela Husbands   . Cardiac  defibrillator placement      primary prevention for sudden death; Dr. Graciela Husbands   . Cardiac catheterization      "lots of those" (08/29/2012)  . Coronary angioplasty with stent placement  2002-08/29/2012    "think I had 4 total then 2 today" to LAD 06/01/2011/notes (08/29/2012)  . Laparoscopic cholecystectomy    . Hernia repair      "w/gallbladder OR" (08/29/2012)  . Percutaneous coronary stent intervention (pci-s) N/A 06/01/2011    Procedure: PERCUTANEOUS CORONARY STENT INTERVENTION (PCI-S);  Surgeon: Peter M Swaziland, MD;  Location: [facility] CATH LAB;  Service: Cardiovascular;  Laterality: N/A;  . Left heart catheterization with coronary angiogram N/A 06/08/2011    Procedure: LEFT HEART CATHETERIZATION WITH CORONARY ANGIOGRAM;  Surgeon: Dolores Patty, MD;  Location: [facility] CATH LAB;  Service: Cardiovascular;  Laterality: N/A;  . Percutaneous coronary stent intervention (pci-s) N/A 08/29/2012    Procedure: PERCUTANEOUS CORONARY STENT INTERVENTION (PCI-S);  Surgeon: Peter M Swaziland, MD;  Location: [facility] CATH LAB;  Service: Cardiovascular;  Laterality: N/A;   Family History  Problem Relation Age of Onset  . Cancer      family hx  . Cancer Father   . Cancer Sister     twin sister and only one has cancer, unknown what kind  . Hypertension Brother    Social History  Substance Use Topics  . Smoking status: Current Every Day Smoker -- 0.50 packs/day for  44 years    Types: Cigarettes    Last Attempt to Quit: 05/27/2011  . Smokeless tobacco: Never Used  . Alcohol Use: No     Comment: 08/29/2012 "6-8 beers once/wk". Has not had alcohol in the last 6-8 months    Review of Systems  Cardiovascular: Positive for chest pain.  Psychiatric/Behavioral: Positive for confusion.  All other systems reviewed and are negative.     Allergies  Review of patient's allergies indicates no known allergies.  Home Medications   Prior to Admission medications   Medication Sig Start Date End Date Taking? Authorizing Provider   aspirin EC 81 MG tablet Take 81 mg by mouth daily.    Historical Provider, MD  atorvastatin (LIPITOR) 40 MG tablet Take 1 tablet (40 mg total) by mouth daily at 6 PM. 05/05/14   Dwana Melena, PA-C  carvedilol (COREG) 12.5 MG tablet Take 1 tablet (12.5 mg total) by mouth daily. Take  1 tablets  daily with meals 05/05/14   Dwana Melena, PA-C  clopidogrel (PLAVIX) 75 MG tablet Take 1 tablet (75 mg total) by mouth daily. Take one table by mouth everyday with breakfast. 05/05/14   Dwana Melena, PA-C  furosemide (LASIX) 20 MG tablet Take 1 tablet (20 mg total) by mouth every morning. 05/05/14   Dwana Melena, PA-C  lisinopril (PRINIVIL,ZESTRIL) 20 MG tablet Take 1 tablet (20 mg total) by mouth daily. 05/05/14   Dwana Melena, PA-C  nitroGLYCERIN (NITROSTAT) 0.4 MG SL tablet Place 1 tablet (0.4 mg total) under the tongue every 5 (five) minutes as needed. For chest pain 05/05/14   Dwana Melena, PA-C   There were no vitals taken for this visit. Physical Exam  Constitutional: He is oriented to person, place, and time. He appears well-developed and well-nourished. No distress.  HENT:  Head: Normocephalic and atraumatic.  Eyes: Conjunctivae and EOM are normal.  Neck: Normal range of motion.  Cardiovascular: Normal rate and regular rhythm.  Exam reveals no gallop and no friction rub.   No murmur heard. Pulmonary/Chest: Effort normal and breath sounds normal. He has no wheezes. He has no rales. He exhibits no tenderness.  Abdominal: Soft. He exhibits no distension. There is no tenderness. There is no rebound.  Musculoskeletal: Normal range of motion.  Neurological: He is alert and oriented to person, place, and time. No cranial nerve deficit. Coordination normal.  Speech is goal-oriented. Moves limbs without ataxia.   Skin: Skin is warm and dry.  Psychiatric: He has a normal mood and affect. His behavior is normal.  Nursing note and vitals reviewed.   ED Course  Procedures (including critical care  time) Labs Review Labs Reviewed  BASIC METABOLIC PANEL - Abnormal; Notable for the following:    Sodium 134 (*)    Chloride 100 (*)    Glucose, Bld 144 (*)    Calcium 8.8 (*)    All other components within normal limits  CBC WITH DIFFERENTIAL/PLATELET  Rosezena Sensor, ED    Imaging Review Dg Chest 2 View  12/08/2014  CLINICAL DATA:  Acute onset chest pain today. Coronary artery disease. Previous myocardial infarction and congestive heart failure. EXAM: CHEST  2 VIEW COMPARISON:  06/13/2014 FINDINGS: Heart size is within normal limits. Both lungs are clear. No evidence of pleural effusion or pneumothorax. Single lead AICD remains in appropriate position. IMPRESSION: No active cardiopulmonary disease. Electronically Signed   By: Myles Rosenthal M.D.   On: 12/08/2014 22:56   Ct Head  Wo Contrast  12/08/2014  CLINICAL DATA:  Confusion, speech difficulty EXAM: CT HEAD WITHOUT CONTRAST TECHNIQUE: Contiguous axial images were obtained from the base of the skull through the vertex without intravenous contrast. COMPARISON:  09/01/2006 FINDINGS: There is no evidence of mass effect, midline shift or extra-axial fluid collections. There is no evidence of a space-occupying lesion or intracranial hemorrhage. There is no evidence of a cortical-based area of acute infarction. The ventricles and sulci are appropriate for the patient's age. The basal cisterns are patent. Visualized portions of the orbits are unremarkable. The visualized portions of the paranasal sinuses and mastoid air cells are unremarkable. The osseous structures are unremarkable. IMPRESSION: No acute intracranial pathology. Electronically Signed   By: Elige KoHetal  Patel   On: 12/08/2014 23:04   I have personally reviewed and evaluated these images and lab results as part of my medical decision-making.   EKG Interpretation   Date/Time:  Saturday December 08 2014 21:33:00 EDT Ventricular Rate:  101 PR Interval:  190 QRS Duration: 159 QT  Interval:  405 QTC Calculation: 525 R Axis:   -85 Text Interpretation:  Sinus tachycardia Ventricular premature complex  Nonspecific IVCD with LAD consistent with old, dynamic lateral t wave.  Confirmed by Donnald GarrePfeiffer, MD, Lebron ConnersMarcy [phone]([id number]) on 12/08/2014 9:40:53 PM      MDM   Final diagnoses:  Chest pain, unspecified chest pain type    9:34 PM Labs, chest xray and CT head pending. No neuro deficits at this time.   11:46 PM Labs, chest xray and head CT unremarkable for acute changes at this time. Patient has multiple risks for acute ischemic cardiac event. Patient will be admitted for chest pain rule out.     Emilia BeckKaitlyn Melina Mosteller, PA-C 12/09/14 0008  Arby BarretteMarcy Pfeiffer, MD 12/09/14 2011

## 2014-12-08 NOTE — ED Notes (Signed)
Pt placed in a gown and hooked up to the monitor with a 5 lead, BP cuff and pulse ox 

## 2014-12-08 NOTE — ED Notes (Addendum)
[facility] with reports of center chest pain which he described as pressure.  Pt st's he has been feeling confused today.  St's chest pain started while sitting at computer.  EMS gave pt ASA and NTG 2 S:

## 2014-12-09 ENCOUNTER — Encounter (HOSPITAL_COMMUNITY): Payer: Self-pay | Admitting: Family Medicine

## 2014-12-09 ENCOUNTER — Observation Stay (HOSPITAL_BASED_OUTPATIENT_CLINIC_OR_DEPARTMENT_OTHER): Payer: Self-pay

## 2014-12-09 ENCOUNTER — Observation Stay (HOSPITAL_COMMUNITY): Payer: Self-pay

## 2014-12-09 DIAGNOSIS — F339 Major depressive disorder, recurrent, unspecified: Secondary | ICD-10-CM

## 2014-12-09 DIAGNOSIS — F411 Generalized anxiety disorder: Secondary | ICD-10-CM

## 2014-12-09 DIAGNOSIS — G459 Transient cerebral ischemic attack, unspecified: Secondary | ICD-10-CM

## 2014-12-09 DIAGNOSIS — R4701 Aphasia: Secondary | ICD-10-CM

## 2014-12-09 DIAGNOSIS — E785 Hyperlipidemia, unspecified: Secondary | ICD-10-CM

## 2014-12-09 DIAGNOSIS — G451 Carotid artery syndrome (hemispheric): Secondary | ICD-10-CM

## 2014-12-09 DIAGNOSIS — F332 Major depressive disorder, recurrent severe without psychotic features: Secondary | ICD-10-CM

## 2014-12-09 LAB — TROPONIN I: Troponin I: <0.03 ng/mL (ref <0.031)

## 2014-12-09 LAB — LIPID PANEL
CHOL/HDL RATIO: 4.5 ratio
CHOLESTEROL: 183 mg/dL (ref 0–200)
HDL: 41 mg/dL (ref >40)
LDL Cholesterol: 130 mg/dL — ABNORMAL HIGH (ref 0–99)
Triglycerides: 61 mg/dL (ref <150)
VLDL: 12 mg/dL (ref 0–40)

## 2014-12-09 LAB — RAPID URINE DRUG SCREEN, HOSP PERFORMED
Amphetamines: POSITIVE — AB
BARBITURATES: NOT DETECTED
Benzodiazepines: NOT DETECTED
Cocaine: NOT DETECTED
Opiates: NOT DETECTED
TETRAHYDROCANNABINOL: NOT DETECTED

## 2014-12-09 MED ORDER — GI COCKTAIL ~~LOC~~: 30.0000 mL | Freq: Four times a day (QID) | ORAL | Status: DC | PRN

## 2014-12-09 MED ORDER — IOHEXOL 350 MG/ML SOLN
50.0000 mL | Freq: Once | INTRAVENOUS | Status: AC | PRN
  Administered 2014-12-09: 50 mL via INTRAVENOUS

## 2014-12-09 MED ORDER — ONDANSETRON HCL 4 MG/2ML IJ SOLN: 4.0000 mg | Freq: Four times a day (QID) | INTRAMUSCULAR | Status: DC | PRN

## 2014-12-09 MED ORDER — STROKE: EARLY STAGES OF RECOVERY BOOK
Freq: Once | Status: DC
  Filled 2014-12-09: qty 1

## 2014-12-09 MED ORDER — SERTRALINE HCL 50 MG PO TABS
50.0000 mg | ORAL_TABLET | Freq: Every day | ORAL | Status: DC
  Administered 2014-12-09 – 2014-12-12 (×4): 50 mg via ORAL
  Filled 2014-12-09 (×4): qty 1

## 2014-12-09 MED ORDER — ATORVASTATIN CALCIUM 80 MG PO TABS
80.0000 mg | ORAL_TABLET | Freq: Every day | ORAL | Status: DC
  Administered 2014-12-09 – 2014-12-11 (×3): 80 mg via ORAL
  Filled 2014-12-09 (×3): qty 1

## 2014-12-09 MED ORDER — CLOPIDOGREL BISULFATE 75 MG PO TABS
75.0000 mg | ORAL_TABLET | Freq: Every day | ORAL | Status: DC
  Administered 2014-12-09 – 2014-12-12 (×4): 75 mg via ORAL
  Filled 2014-12-09 (×4): qty 1

## 2014-12-09 MED ORDER — ACETAMINOPHEN 325 MG PO TABS
650.0000 mg | ORAL_TABLET | ORAL | Status: DC | PRN
  Filled 2014-12-09: qty 2

## 2014-12-09 MED ORDER — ENOXAPARIN SODIUM 40 MG/0.4ML ~~LOC~~ SOLN
40.0000 mg | SUBCUTANEOUS | Status: DC
  Administered 2014-12-09 – 2014-12-12 (×4): 40 mg via SUBCUTANEOUS
  Filled 2014-12-09 (×4): qty 0.4

## 2014-12-09 MED ORDER — TRAZODONE HCL 50 MG PO TABS
50.0000 mg | ORAL_TABLET | Freq: Every evening | ORAL | Status: DC | PRN
  Administered 2014-12-10 – 2014-12-11 (×2): 50 mg via ORAL
  Filled 2014-12-09 (×2): qty 1

## 2014-12-09 MED ORDER — ASPIRIN EC 81 MG PO TBEC
81.0000 mg | DELAYED_RELEASE_TABLET | Freq: Every day | ORAL | Status: DC
  Administered 2014-12-09 – 2014-12-12 (×4): 81 mg via ORAL
  Filled 2014-12-09 (×4): qty 1

## 2014-12-09 MED ORDER — ATORVASTATIN CALCIUM 40 MG PO TABS: 40.0000 mg | ORAL_TABLET | Freq: Every day | ORAL | Status: DC

## 2014-12-09 NOTE — Progress Notes (Signed)
Notified Dr. Mahala MenghiniSamtani of results of echo. Cardiology to see pt in am. Will closely monitor

## 2014-12-09 NOTE — Progress Notes (Signed)
59 y/o ? admti 3/11-3/12/16 ROMI CAD-stent 1998, AMI 2002 with LAD stent, UA 08/2004, AMI 04/2007, PCI 06/01/11 Resulting Isch CM-LAst Myoview 08/07/14 EF 25% Afib diag 2002 after MI-Placement of Medtronic single chamber CV-Defib childhood rh Fever Htn HLD LBP WIfe left him recently and has claimed SI  Admitted with CP but primarily word finding and reading difficulty and some emotional lability.  MIR/CT/ suggrestive TIa Neuro consulted ECHO showed drop in EF to 15%   Labile in room and crying and remorseful for non-compliance with meds.  Daughters express grave concerns about his ability to take care of himself from a psychological perspective and have tried  To ge tpsychaitric care  P-as per HPI Finish CVA work-up.  Call Cardiology in am [facility] Likely d/c home once work-up complete  Pleas KochJai Eura Radabaugh, MD [facility] [phone](P) [phone]

## 2014-12-09 NOTE — Progress Notes (Addendum)
STROKE TEAM PROGRESS NOTE   HISTORY [name] is an 59 y.o. male with a history of cardiomyopathy, ongoing tobacco use, and noncompliance with medications, who reports that this evening while at home he had an episode when he could not get his words out. He was unable to remember his codes for his video games and could not read or write. He was able to contact his daughters who called EMS. Symptoms lasted about an hour and resolved spontaneously. It appears that the patient had some chest pain as well. He is now being admitted for further work up.   Date last known well: Date: 12/08/2014 Time last known well: Time: 19:00 tPA Given: No: Resolution of symptoms    SUBJECTIVE (INTERVAL HISTORY) No family members present. The patient feels back to baseline. He denies recent drug use. He had not been taking his medications as prescribed.    OBJECTIVE Temp:  [97.7 F (36.5 C)-98.1 F (36.7 C)] 97.7 F (36.5 C) (10/16 0820) Pulse Rate:  [70-100] 70 (10/16 0450) Cardiac Rhythm:  [-] Normal sinus rhythm (10/16 0805) Resp:  [13-23] 14 (10/16 0450) BP: (99-130)/(48-75) 99/48 mmHg (10/16 0450) SpO2:  [90 %-99 %] 99 % (10/16 0450) Weight:  [90.6 kg (199 lb 11.8 oz)] 90.6 kg (199 lb 11.8 oz) (10/16 0152)  CBC:  Recent Labs Lab 12/08/14 2203  WBC 7.0  NEUTROABS 5.7  HGB 14.3  HCT 41.9  MCV 90.7  PLT 189    Basic Metabolic Panel:  Recent Labs Lab 12/08/14 2203  NA 134*  K 3.7  CL 100*  CO2 25  GLUCOSE 144*  BUN 12  CREATININE 1.16  CALCIUM 8.8*    Lipid Panel:    Component Value Date/Time   CHOL 183 12/09/2014 0310   TRIG 61 12/09/2014 0310   HDL 41 12/09/2014 0310   CHOLHDL 4.5 12/09/2014 0310   VLDL 12 12/09/2014 0310   LDLCALC 130* 12/09/2014 0310   HgbA1c:  Lab Results  Component Value Date   HGBA1C 5.4 05/05/2014   Urine Drug Screen:    Component Value Date/Time   LABOPIA NONE DETECTED 12/09/2014 0315   COCAINSCRNUR NONE DETECTED 12/09/2014 0315   LABBENZ NONE DETECTED 12/09/2014 0315   AMPHETMU POSITIVE* 12/09/2014 0315   THCU NONE DETECTED 12/09/2014 0315   LABBARB NONE DETECTED 12/09/2014 0315      IMAGING  Ct Angio Head and Neck W/cm &/or Wo Cm 12/09/2014   Mild atherosclerotic disease in the carotid bifurcation bilaterally. No significant carotid or vertebral artery stenosis in the neck. Mild atherosclerotic disease in the cavernous carotid bilaterally without significant stenosis. No significant intracranial stenosis or aneurysm. No acute intracranial abnormality.   Dg Chest 2 View 12/08/2014   No active cardiopulmonary disease.   Ct Head Wo Contrast 12/08/2014   No acute intracranial pathology.   2D echo -  - Left ventricle: Systolic function is severely reduced, estimated EF 15%. The cavity size was moderately dilated. Features are consistent with a pseudonormal left ventricular filling pattern, with concomitant abnormal relaxation and increased filling pressure (grade 2 diastolic dysfunction). Doppler parameters are consistent with high ventricular filling pressure. - Regional wall motion abnormality: Akinesis of the mid-apical anterior, apical inferior, mid anterolateral, apical lateral, and apical myocardium; severe hypokinesis of the mid anteroseptal, mid inferior, and apical septal myocardium; moderate hypokinesis of the basal anteroseptal, basal inferior, and basal-mid inferolateral myocardium. - Aortic valve: Mildly calcified annulus. Trileaflet. There was mild to moderate regurgitation. - Mitral valve: There was moderate  regurgitation. - Left atrium: The atrium was severely dilated. Volume/bsa, ES, (1-plane Simpson&'s, A2C): 48.1 ml/m^2. - Right ventricle: Pacer wire or catheter noted in right ventricle. Systolic function was mildly reduced. - Right atrium: The atrium was mildly dilated.   PHYSICAL EXAM  Temp:  [97.7 F (36.5 C)-98.1 F (36.7 C)] 97.8 F (36.6 C) (10/16  1955) Pulse Rate:  [63-100] 74 (10/16 1955) Resp:  [13-23] 16 (10/16 1955) BP: (86-130)/(44-75) 86/44 mmHg (10/16 1955) SpO2:  [90 %-99 %] 96 % (10/16 1955) Weight:  [199 lb 11.8 oz (90.6 kg)] 199 lb 11.8 oz (90.6 kg) (10/16 0152)  General - Well nourished, well developed, in no apparent distress.  Ophthalmologic - Sharp disc margins OU.   Cardiovascular - Regular rate and rhythm.  Mental Status -  Level of arousal and orientation to time, place, and person were intact. Language including expression, naming, repetition, comprehension was assessed and found intact. Fund of Knowledge was assessed and was intact.  Cranial Nerves II - XII - II - Visual field intact OU. III, IV, VI - Extraocular movements intact. V - Facial sensation intact bilaterally. VII - Facial movement intact bilaterally. VIII - Hearing & vestibular intact bilaterally. X - Palate elevates symmetrically. XI - Chin turning & shoulder shrug intact bilaterally. XII - Tongue protrusion intact.  Motor Strength - The patient's strength was normal in all extremities and pronator drift was absent.  Bulk was normal and fasciculations were absent.   Motor Tone - Muscle tone was assessed at the neck and appendages and was normal.  Reflexes - The patient's reflexes were 1+ in all extremities and he had no pathological reflexes.  Sensory - Light touch, temperature/pinprick were assessed and were symmetrical.    Coordination - The patient had normal movements in the hands and feet with no ataxia or dysmetria.  Tremor was absent  Gait and Station - The patient's transfers, posture, gait, station, and turns were observed as normal.   ASSESSMENT/PLAN [name] is a 59 y.o. male with history of hypertension, noncompliant with medications, ongoing tobacco use, history of substance abuse, hyperlipidemia, ischemic cardiomyopathy, coronary artery disease, paroxysmal ventricular tachycardia, congestive heart failure s/p  AICD, and previous MI presenting with speech difficulties. He did not receive IV t-PA due to resolution of deficits.  Suspected left brain TIA vs. Stroke - likely due to severe cardiomyopathy.  Resultant  resolution of deficits  MRI / MRA not performed due to AICD  CTA head and neck - mild asthero  2D Echo  EF 15%  EEG - pending  Recommend AICD interrogation to rule out afib.  LDL 130  HgbA1c pending  VTE prophylaxis - Lovenox  Diet Heart Room service appropriate?: Yes; Fluid consistency:: Thin  aspirin 81 mg orally every day and clopidogrel 75 mg orally every day prior to admission but noncompliance, now on aspirin 81 mg orally every day and clopidogrel 75 mg orally every day. Due to severe cardiomyopathy likely the cause of stroke, we recommend anticoagulation with coumadin.  Patient counseled to be compliant with his antithrombotic medications  Ongoing aggressive stroke risk factor management  Therapy recommendations: Pending  Disposition: Pending  Severe cardiomyopathy  EF 20-25% in the past  Did not follow up with cardiology regularly  Recommend cardiology consult  Recommend AICD interrogation to rule out afib.  Due to severe cardiomyopathy likely the cause of stroke, we recommend anticoagulation with coumadin.  Repeat 2D echo in 2-3 months after treatment, if EF >= 30%, pt  can be off coumadin.  Hypertension  Blood pressure tends to run low - ischemic cardiomyopathy - currently off all antihypertensive medications.  Permissive hypertension (OK if < 220/120) but gradually normalize in 5-7 days  Hyperlipidemia  Home meds:  Atorvastatin 40 mg daily resumed in the hospital  LDL 130, goal < 70  Increase Lipitor to 80 mg daily  Continue statin at discharge  Tobacco abuse  Current smoker  Smoking cessation counseling provided  Pt is willing to quit  Other Stroke Risk Factors  Advanced age  Coronary artery disease s/p stent  S/p AICD  Other  Active Problems  UDS - positive for amphetamines  Hospital day # 1  Marvel Plan, MD PhD Stroke Neurology 12/09/2014 9:34 PM     To contact Stroke Continuity provider, please refer to [email or web address]. After hours, contact General Neurology

## 2014-12-09 NOTE — Consult Note (Signed)
[facility] Consult   Reason for Consult:  Depression, anxiety Referring Physician:  Dr. Renie Ora Patient Identification: [name] MRN:  [id number] Principal Diagnosis: Aphasia Diagnosis:   Patient Active Problem List   Diagnosis Date Noted  . Generalized anxiety disorder [F41.1] 12/09/2014    Priority: High  . Major depressive disorder, recurrent episode ([place]) [F33.9] 12/09/2014    Priority: High  . Aphasia [R47.01] 05/04/2014    Priority: Medium  . Unstable angina ([place]) [I20.0] 06/02/2011  . CAD (coronary artery disease) [I25.10]   . Ischemic cardiomyopathy [I25.5]   . Automatic implantable cardioverter-defibrillator in situ [Z95.810]   . TOBACCO ABUSE [F17.200] 02/04/2010  . VENTRICULAR TACHYCARDIA [I47.2] 08/21/2008  . HYPERLIPIDEMIA-MIXED [E78.5] 06/04/2008  . ANXIETY [F41.1] 06/04/2008  . Essential hypertension [I10] 06/04/2008  . CARDIOMYOPATHY, ISCHEMIC [I25.89] 06/04/2008  . CHEST PAIN-UNSPECIFIED [R07.9] 06/04/2008    Total Time spent with patient: 1 hour  Subjective:   [name] is a 59 y.o. male patient admitted with confusion, difficulty word finding and chest pain.  HPI: Patient is a 59 year old male with a past medical history of ischemic cardiomyopathy, hyperlipidemia, hypertension, tobacco abuse and unstable angina. He reports prior history of depression in 2002 after he suffered massive MI. He was placed on antidepressant for few years. Patient reports that he has been getting increasingly depressed since his wife separated from him in March, 2016. He reports feeling lonely, recurrent crying episodes, hopelessness, anxiety, excessive worries, apprehension, irritability, poor appetite, difficulty sleeping and poor concentration. He is currently living with his sister who is supportive. However, even though he is employed, he no longer enjoys his Job. Patient denies suicidal/homicidal thoughts, delusional thinking or psychosis.   Past  Psychiatric History: MDD in 2002   Risk to Self: Is patient at risk for suicide?: No Risk to Others:   Prior Inpatient Therapy:   Prior Outpatient Therapy:    Past Medical History:  Past Medical History  Diagnosis Date  . HTN (hypertension)   . HLD (hyperlipidemia) mixed  . Ischemic cardiomyopathy     a.  EF 30-35% 2010;  b.  EF 20-25% by LV gram 08/2012.  [name] CAD (coronary artery disease)     a.  Severe LAD stenosis 2/2 acute thrombus - BMS 2009;  b. 06/01/11 Cath - LAD 20 isr, 37m(3.0x16 Veri-flex BMS), OM1 100p, EF 20-25%;  c. 07/2012 Abnl Cardiolite;  d. 08/2012 Cath/PCI: LM nl, LAD patent stents, D2 80-90 (jailed), LCX 90 p/m (4.0x24 Promus Premier DES), OM1 100, OM2 80-90p (3.0x20 Promus Premier DES), RCA patent mid stent, 40d, EF 25%.  . Anxiety   . Paroxysmal ventricular tachycardia ([facility])     a. VFlutter  CL 210 msec  Rx shock 04/2011  . Systolic CHF, chronic ([facility]   . Automatic implantable cardioverter-defibrillator in situ   . Heart murmur     "as a child" (08/29/2012)  . History of rheumatic fever 1962  . Anginal pain ([place]     "only w/my MI" (08/29/2012)  . Myocardial infarction ([facility] 1998; 2002; ~ 2010  . Depression     Past Surgical History  Procedure Laterality Date  . Defibrillator implantation  04/12/02    primary prevention for sudden death; Dr. KCaryl Comes  . Cardiac defibrillator placement      primary prevention for sudden death; Dr. KCaryl Comes  . Cardiac catheterization      "lots of those" (08/29/2012)  . Coronary angioplasty with stent placement  2002-08/29/2012    "  think I had 4 total then 2 today" to LAD 06/01/2011/notes (08/29/2012)  . Laparoscopic cholecystectomy    . Hernia repair      "w/gallbladder OR" (08/29/2012)  . Percutaneous coronary stent intervention (pci-s) N/A 06/01/2011    Procedure: PERCUTANEOUS CORONARY STENT INTERVENTION (PCI-S);  Surgeon: Peter M Martinique, MD;  Location: [facility] CATH LAB;  Service: Cardiovascular;  Laterality: N/A;  . Left heart catheterization with  coronary angiogram N/A 06/08/2011    Procedure: LEFT HEART CATHETERIZATION WITH CORONARY ANGIOGRAM;  Surgeon: Jolaine Artist, MD;  Location: [facility] CATH LAB;  Service: Cardiovascular;  Laterality: N/A;  . Percutaneous coronary stent intervention (pci-s) N/A 08/29/2012    Procedure: PERCUTANEOUS CORONARY STENT INTERVENTION (PCI-S);  Surgeon: Peter M Martinique, MD;  Location: [facility] CATH LAB;  Service: Cardiovascular;  Laterality: N/A;   Family History:  Family History  Problem Relation Age of Onset  . Cancer      family hx  . Cancer Father   . Cancer Sister     twin sister and only one has cancer, unknown what kind  . Hypertension Brother    Family Psychiatric  History: Denies family history of mental illness Social History:  History  Alcohol Use No    Comment: 08/29/2012 "6-8 beers once/wk". Has not had alcohol in the last 6-8 months     History  Drug Use No    Social History   Social History  . Marital Status: Married    Spouse Name: N/A  . Number of Children: N/A  . Years of Education: N/A   Social History Main Topics  . Smoking status: Current Every Day Smoker -- 0.50 packs/day for 44 years    Types: Cigarettes    Last Attempt to Quit: 05/27/2011  . Smokeless tobacco: Never Used  . Alcohol Use: No     Comment: 08/29/2012 "6-8 beers once/wk". Has not had alcohol in the last 6-8 months  . Drug Use: No  . Sexual Activity: Not Currently   Other Topics Concern  . None   Social History Narrative   Married; full time.    Additional Social History:                          Allergies:  No Known Allergies  Labs:  Results for orders placed or performed during the hospital encounter of 12/08/14 (from the past 48 hour(s))  CBC with Differential/Platelet     Status: None   Collection Time: 12/08/14 10:03 PM  Result Value Ref Range   WBC 7.0 4.0 - 10.5 K/uL   RBC 4.62 4.22 - 5.81 MIL/uL   Hemoglobin 14.3 13.0 - 17.0 g/dL   HCT 41.9 39.0 - 52.0 %   MCV 90.7 78.0 - 100.0  fL   MCH 31.0 26.0 - 34.0 pg   MCHC 34.1 30.0 - 36.0 g/dL   RDW 13.4 11.5 - 15.5 %   Platelets 189 150 - 400 K/uL   Neutrophils Relative % 82 %   Neutro Abs 5.7 1.7 - 7.7 K/uL   Lymphocytes Relative 11 %   Lymphs Abs 0.8 0.7 - 4.0 K/uL   Monocytes Relative 6 %   Monocytes Absolute 0.4 0.1 - 1.0 K/uL   Eosinophils Relative 1 %   Eosinophils Absolute 0.1 0.0 - 0.7 K/uL   Basophils Relative 0 %   Basophils Absolute 0.0 0.0 - 0.1 K/uL  Basic metabolic panel     Status: Abnormal   Collection Time: 12/08/14  10:03 PM  Result Value Ref Range   Sodium 134 (L) 135 - 145 mmol/L   Potassium 3.7 3.5 - 5.1 mmol/L   Chloride 100 (L) 101 - 111 mmol/L   CO2 25 22 - 32 mmol/L   Glucose, Bld 144 (H) 65 - 99 mg/dL   BUN 12 6 - 20 mg/dL   Creatinine, Ser 1.16 0.61 - 1.24 mg/dL   Calcium 8.8 (L) 8.9 - 10.3 mg/dL   GFR calc non Af Amer >60 >60 mL/min   GFR calc Af Amer >60 >60 mL/min    Comment: (NOTE) The eGFR has been calculated using the CKD EPI equation. This calculation has not been validated in all clinical situations. eGFR's persistently <60 mL/min signify possible Chronic Kidney Disease.    Anion gap 9 5 - 15  I-stat troponin, ED     Status: None   Collection Time: 12/08/14 10:34 PM  Result Value Ref Range   Troponin i, poc 0.02 0.00 - 0.08 ng/mL   Comment 3            Comment: Due to the release kinetics of cTnI, a negative result within the first hours of the onset of symptoms does not rule out myocardial infarction with certainty. If myocardial infarction is still suspected, repeat the test at appropriate intervals.   Troponin I-serum (0, 3, 6 hours)     Status: None   Collection Time: 12/09/14  3:10 AM  Result Value Ref Range   Troponin I <0.03 <0.031 ng/mL    Comment:        NO INDICATION OF MYOCARDIAL INJURY.   Lipid panel     Status: Abnormal   Collection Time: 12/09/14  3:10 AM  Result Value Ref Range   Cholesterol 183 0 - 200 mg/dL   Triglycerides 61 <150 mg/dL    HDL 41 >40 mg/dL   Total CHOL/HDL Ratio 4.5 RATIO   VLDL 12 0 - 40 mg/dL   LDL Cholesterol 130 (H) 0 - 99 mg/dL    Comment:        Total Cholesterol/HDL:CHD Risk Coronary Heart Disease Risk Table                     Men   Women  1/2 Average Risk   3.4   3.3  Average Risk       5.0   4.4  2 X Average Risk   9.6   7.1  3 X Average Risk  23.4   11.0        Use the calculated Patient Ratio above and the CHD Risk Table to determine the patient's CHD Risk.        ATP III CLASSIFICATION (LDL):  <100     mg/dL   Optimal  100-129  mg/dL   Near or Above                    Optimal  130-159  mg/dL   Borderline  160-189  mg/dL   High  >190     mg/dL   Very High   Urine rapid drug screen (hosp performed)     Status: Abnormal   Collection Time: 12/09/14  3:15 AM  Result Value Ref Range   Opiates NONE DETECTED NONE DETECTED   Cocaine NONE DETECTED NONE DETECTED   Benzodiazepines NONE DETECTED NONE DETECTED   Amphetamines POSITIVE (A) NONE DETECTED   Tetrahydrocannabinol NONE DETECTED NONE DETECTED   Barbiturates NONE DETECTED NONE DETECTED  Comment:        DRUG SCREEN FOR MEDICAL PURPOSES ONLY.  IF CONFIRMATION IS NEEDED FOR ANY PURPOSE, NOTIFY LAB WITHIN 5 DAYS.        LOWEST DETECTABLE LIMITS FOR URINE DRUG SCREEN Drug Class       Cutoff (ng/mL) Amphetamine      1000 Barbiturate      200 Benzodiazepine   616 Tricyclics       073 Opiates          300 Cocaine          300 THC              50   Troponin I-serum (0, 3, 6 hours)     Status: None   Collection Time: 12/09/14  6:20 AM  Result Value Ref Range   Troponin I <0.03 <0.031 ng/mL    Comment:        NO INDICATION OF MYOCARDIAL INJURY.   Troponin I-serum (0, 3, 6 hours)     Status: None   Collection Time: 12/09/14  9:40 AM  Result Value Ref Range   Troponin I <0.03 <0.031 ng/mL    Comment:        NO INDICATION OF MYOCARDIAL INJURY.     Current Facility-Administered Medications  Medication Dose Route Frequency  Provider Last Rate Last Dose  .  stroke: mapping our early stages of recovery book   Does not apply Once Edwin Dada, MD      . acetaminophen (TYLENOL) tablet 650 mg  650 mg Oral Q4H PRN Edwin Dada, MD      . aspirin EC tablet 81 mg  81 mg Oral Daily Edwin Dada, MD   81 mg at 12/09/14 0813  . atorvastatin (LIPITOR) tablet 80 mg  80 mg Oral q1800 David L Rinehuls, PA-C      . clopidogrel (PLAVIX) tablet 75 mg  75 mg Oral Daily Edwin Dada, MD   75 mg at 12/09/14 0814  . enoxaparin (LOVENOX) injection 40 mg  40 mg Subcutaneous Q24H David L Rinehuls, PA-C   40 mg at 12/09/14 1207  . gi cocktail (Maalox,Lidocaine,Donnatal)  30 mL Oral QID PRN Edwin Dada, MD      . ondansetron (ZOFRAN) injection 4 mg  4 mg Intravenous Q6H PRN Edwin Dada, MD        Musculoskeletal: Strength & Muscle Tone: within normal limits Gait & Station: normal Patient leans: N/A  Psychiatric Specialty Exam: Review of Systems  Constitutional: Positive for malaise/fatigue.  HENT: Negative.   Eyes: Negative.   Respiratory: Negative.   Cardiovascular: Negative.   Gastrointestinal: Negative.   Genitourinary: Negative.   Musculoskeletal: Negative.   Skin: Negative.   Neurological: Positive for speech change.  Endo/Heme/Allergies: Negative.   Psychiatric/Behavioral: Positive for depression. The patient is nervous/anxious.     Blood pressure 128/73, pulse 70, temperature 97.7 F (36.5 C), temperature source Oral, resp. rate 15, height 5' 11"  (1.803 m), weight 90.6 kg (199 lb 11.8 oz), SpO2 99 %.Body mass index is 27.87 kg/(m^2).  General Appearance: Casual  Eye Contact::  Good  Speech:  Clear and Coherent  Volume:  Normal  Mood:  Anxious, Depressed and Hopeless  Affect:  Constricted  Thought Process:  Goal Directed  Orientation:  Full (Time, Place, and Person)  Thought Content:  Negative  Suicidal Thoughts:  No  Homicidal Thoughts:  No  Memory:   Immediate;   Fair Recent;   Fair Remote;  Good  Judgement:  Fair  Insight:  Fair  Psychomotor Activity:  Decreased  Concentration:  Good  Recall:  Good  Fund of Knowledge:Good  Language: Good  Akathisia:  No  Handed:  Right  AIMS (if indicated):     Assets:  Communication Skills Desire for Improvement Social Support  ADL's:  Intact  Cognition: WNL  Sleep:   fair   Treatment Plan Summary: Daily contact with patient to assess and evaluate symptoms and progress in treatment and Medication management  No evidence of imminent risk to self or others at present.   Patient does not meet criteria for psychiatric inpatient admission. Zoloft 8m daily for depression/anxiety.  Disposition: Pls refer patient to outpatient treatment facility for medication management/counseling upon discharge.  ACorena Pilgrim MD 12/09/2014 2:05 PM

## 2014-12-09 NOTE — Evaluation (Signed)
Physical Therapy Evaluation Patient Details Name: [name] LuriaLewis R Campion MRN: [id number] DOB: 07/25/1955 Today's Date: 12/09/2014   History of Present Illness  59 yo male with symptoms of depression and suicidal thoughts was admitted for workup of  aphasic changes, no acute neurological or cardiac event but EF 15% now.  Continuing to monitor him for neuro changes.  Clinical Impression  Pt was able to walk without assistance, pulses were controlled from 70-86 during gait with no tachycardia and telemetry in place.  Pt is sitting up in chair when PT left him, able to walk the hall without assistance.  Spoke with nursing and pt to have them contact PT if changes in his functional level occur and more intervention is needed.    Follow Up Recommendations No PT follow up    Equipment Recommendations  None recommended by PT    Recommendations for Other Services       Precautions / Restrictions Precautions Precautions: Fall (telemetry) Restrictions Weight Bearing Restrictions: No      Mobility  Bed Mobility Overal bed mobility: Modified Independent                Transfers Overall transfer level: Modified independent                  Ambulation/Gait Ambulation/Gait assistance: Supervision Ambulation Distance (Feet): 200 Feet Assistive device: None Gait Pattern/deviations: Step-through pattern;Decreased stride length;Narrow base of support;Trunk flexed Gait velocity: normal Gait velocity interpretation: at or above normal speed for age/gender General Gait Details: has developed a somewhat changed posture but can walk without contact on him  Stairs            Wheelchair Mobility    Modified Rankin (Stroke Patients Only)       Balance Overall balance assessment: Modified Independent                                           Pertinent Vitals/Pain Pain Assessment: No/denies pain    Home Living Family/patient expects to be discharged to::  Private residence Living Arrangements: Other relatives Available Help at Discharge: Family;Available PRN/intermittently Type of Home: House Home Access: Stairs to enter Entrance Stairs-Rails: Left Entrance Stairs-Number of Steps: 3 Home Layout: One level Home Equipment: Cane - single point      Prior Function Level of Independence: Independent (worked as Psychologist, forensicheavy equipment operator)         Comments: sister at home with him, takes care of her     Hand Dominance        Extremity/Trunk Assessment   Upper Extremity Assessment: Overall WFL for tasks assessed           Lower Extremity Assessment: Overall WFL for tasks assessed      Cervical / Trunk Assessment: Normal  Communication   Communication: No difficulties  Cognition Arousal/Alertness: Awake/alert Behavior During Therapy: WFL for tasks assessed/performed Overall Cognitive Status: Within Functional Limits for tasks assessed                      General Comments General comments (skin integrity, edema, etc.): Pt is walking wiht no AD, needs no assistance to control walk on the hallway    Exercises        Assessment/Plan    PT Assessment Patent does not need any further PT services  PT Diagnosis Other (comment) (assessment of gait post aphasic  episode)   PT Problem List    PT Treatment Interventions     PT Goals (Current goals can be found in the Care Plan section) Acute Rehab PT Goals Patient Stated Goal: none stated PT Goal Formulation: All assessment and education complete, DC therapy    Frequency     Barriers to discharge        Co-evaluation               End of Session   Activity Tolerance: Patient tolerated treatment well Patient left: in chair;with call bell/phone within reach Nurse Communication: Mobility status;Other (comment) (Plan to discharge due to independence)    Functional Assessment Tool Used: clinical judgment Functional Limitation: Mobility: Walking and  moving around Mobility: Walking and Moving Around Current Status [phone]): At least 1 percent but less than 20 percent impaired, limited or restricted Mobility: Walking and Moving Around Goal Status [phone]): At least 1 percent but less than 20 percent impaired, limited or restricted    Time: 1502-1527 PT Time Calculation (min) (ACUTE ONLY): 25 min   Charges:   PT Evaluation $Initial PT Evaluation Tier I: 1 Procedure PT Treatments $Gait Training: 8-22 mins   PT G Codes:   PT G-Codes **NOT FOR INPATIENT CLASS** Functional Assessment Tool Used: clinical judgment Functional Limitation: Mobility: Walking and moving around Mobility: Walking and Moving Around Current Status ([id number]): At least 1 percent but less than 20 percent impaired, limited or restricted Mobility: Walking and Moving Around Goal Status [phone]): At least 1 percent but less than 20 percent impaired, limited or restricted    Ivar Drape 12/09/2014, 4:23 PM   Samul Dada, PT MS Acute Rehab Dept. Number: [id number] and [facility] [phone]

## 2014-12-09 NOTE — Consult Note (Signed)
Referring Physician: Danford    Chief Complaint: Episode of aphasia  HPI: [name] is an 59 y.o. male with a history of cardiomyopathy, noncompliant with medications, who reports that this evening while at home he had an episode when he could not get his words out.  He was unable to remember his codes for his video games and could not read or write.  He was able to contact his daughters who called EMS.  Symptoms lasted about an hour and resolved spontaneously.  It appears that the patient had some chest pain as well.  He is now being admitted for further work up.    Date last known well: Date: 12/08/2014 Time last known well: Time: 19:00 tPA Given: No: Resolution of symptoms  Past Medical History  Diagnosis Date  . HTN (hypertension)   . HLD (hyperlipidemia) mixed  . Ischemic cardiomyopathy     a.  EF 30-35% 2010;  b.  EF 20-25% by LV gram 08/2012.  [name] CAD (coronary artery disease)     a.  Severe LAD stenosis 2/2 acute thrombus - BMS 2009;  b. 06/01/11 Cath - LAD 20 isr, 34m (3.0x16 Veri-flex BMS), OM1 100p, EF 20-25%;  c. 07/2012 Abnl Cardiolite;  d. 08/2012 Cath/PCI: LM nl, LAD patent stents, D2 80-90 (jailed), LCX 90 p/m (4.0x24 Promus Premier DES), OM1 100, OM2 80-90p (3.0x20 Promus Premier DES), RCA patent mid stent, 40d, EF 25%.  . Anxiety   . Paroxysmal ventricular tachycardia (HCC)     a. VFlutter  CL 210 msec  Rx shock 04/2011  . Systolic CHF, chronic (HCC)   . Automatic implantable cardioverter-defibrillator in situ   . Heart murmur     "as a child" (08/29/2012)  . History of rheumatic fever 1962  . Anginal pain (HCC)     "only w/my MI" (08/29/2012)  . Myocardial infarction [facility]) 1998; 2002; ~ 2010    Past Surgical History  Procedure Laterality Date  . Defibrillator implantation  04/12/02    primary prevention for sudden death; Dr. Graciela Husbands   . Cardiac defibrillator placement      primary prevention for sudden death; Dr. Graciela Husbands   . Cardiac catheterization      "lots of those"  (08/29/2012)  . Coronary angioplasty with stent placement  2002-08/29/2012    "think I had 4 total then 2 today" to LAD 06/01/2011/notes (08/29/2012)  . Laparoscopic cholecystectomy    . Hernia repair      "w/gallbladder OR" (08/29/2012)  . Percutaneous coronary stent intervention (pci-s) N/A 06/01/2011    Procedure: PERCUTANEOUS CORONARY STENT INTERVENTION (PCI-S);  Surgeon: Peter M Swaziland, MD;  Location: [facility] CATH LAB;  Service: Cardiovascular;  Laterality: N/A;  . Left heart catheterization with coronary angiogram N/A 06/08/2011    Procedure: LEFT HEART CATHETERIZATION WITH CORONARY ANGIOGRAM;  Surgeon: Dolores Patty, MD;  Location: [facility] CATH LAB;  Service: Cardiovascular;  Laterality: N/A;  . Percutaneous coronary stent intervention (pci-s) N/A 08/29/2012    Procedure: PERCUTANEOUS CORONARY STENT INTERVENTION (PCI-S);  Surgeon: Peter M Swaziland, MD;  Location: [facility] CATH LAB;  Service: Cardiovascular;  Laterality: N/A;    Family History  Problem Relation Age of Onset  . Cancer      family hx  . Cancer Father   . Cancer Sister     twin sister and only one has cancer, unknown what kind  . Hypertension Brother    Social History:  reports that he has been smoking Cigarettes.  He has a 22 pack-year  smoking history. He has never used smokeless tobacco. He reports that he does not drink alcohol or use illicit drugs.  Allergies: No Known Allergies  Medications: I have reviewed the patient's current medications. Prior to Admission:  Prior to Admission medications   Medication Sig Start Date End Date Taking? Authorizing Provider  nitroGLYCERIN (NITROSTAT) 0.4 MG SL tablet Place 1 tablet (0.4 mg total) under the tongue every 5 (five) minutes as needed. For chest pain 05/05/14  Yes Dwana Melena, PA-C  aspirin EC 81 MG tablet Take 81 mg by mouth daily.    Historical Provider, MD  atorvastatin (LIPITOR) 40 MG tablet Take 1 tablet (40 mg total) by mouth daily at 6 PM. Patient not taking: Reported on 12/08/2014  05/05/14   Dwana Melena, PA-C  carvedilol (COREG) 12.5 MG tablet Take 1 tablet (12.5 mg total) by mouth daily. Take  1 tablets  daily with meals Patient not taking: Reported on 12/08/2014 05/05/14   Dwana Melena, PA-C  clopidogrel (PLAVIX) 75 MG tablet Take 1 tablet (75 mg total) by mouth daily. Take one table by mouth everyday with breakfast. Patient not taking: Reported on 12/08/2014 05/05/14   Dwana Melena, PA-C  lisinopril (PRINIVIL,ZESTRIL) 20 MG tablet Take 1 tablet (20 mg total) by mouth daily. Patient not taking: Reported on 12/08/2014 05/05/14   Dwana Melena, PA-C    ROS: History obtained from the patient  General ROS: negative for - chills, fatigue, fever, night sweats, weight gain or weight loss Psychological ROS: negative for - behavioral disorder, hallucinations, memory difficulties, mood swings or suicidal ideation Ophthalmic ROS: negative for - blurry vision, double vision, eye pain or loss of vision ENT ROS: negative for - epistaxis, nasal discharge, oral lesions, sore throat, tinnitus or vertigo Allergy and Immunology ROS: negative for - hives or itchy/watery eyes Hematological and Lymphatic ROS: negative for - bleeding problems, bruising or swollen lymph nodes Endocrine ROS: negative for - galactorrhea, hair pattern changes, polydipsia/polyuria or temperature intolerance Respiratory ROS: negative for - cough, hemoptysis, shortness of breath or wheezing Cardiovascular ROS: as noted in HPI Gastrointestinal ROS: negative for - abdominal pain, diarrhea, hematemesis, nausea/vomiting or stool incontinence Genito-Urinary ROS: negative for - dysuria, hematuria, incontinence or urinary frequency/urgency Musculoskeletal ROS: negative for - joint swelling or muscular weakness Neurological ROS: as noted in HPI Dermatological ROS: negative for rash and skin lesion changes  Physical Examination: Blood pressure 108/57, pulse 75, temperature 97.9 F (36.6 C), temperature source Oral,  resp. rate 18, SpO2 90 %.  Gen-NAD HEENT-  Normocephalic, no lesions, without obvious abnormality.  Normal external eye and conjunctiva.  Normal TM's bilaterally.  Normal auditory canals and external ears. Normal external nose, mucus membranes and septum.  Normal pharynx. Cardiovascular- S1, S2 normal, pulses palpable throughout   Lungs- chest clear, no wheezing, rales, normal symmetric air entry Abdomen- soft, non-tender; bowel sounds normal; no masses,  no organomegaly Extremities- no edema Lymph-no adenopathy palpable Musculoskeletal-no joint tenderness, deformity or swelling Skin-warm and dry, no hyperpigmentation, vitiligo, or suspicious lesions  Neurological Examination Mental Status: Alert, oriented, thought content appropriate.  Speech fluent without evidence of aphasia.  Able to follow 3 step commands without difficulty. Cranial Nerves: II: Discs flat bilaterally; Visual fields grossly normal, pupils equal, round, reactive to light and accommodation III,IV, VI: ptosis not present, extra-ocular motions intact bilaterally V,VII: smile symmetric, facial light touch sensation normal bilaterally VIII: hearing normal bilaterally IX,X: gag reflex present XI: bilateral shoulder shrug XII: midline tongue extension Motor:  Right : Upper extremity   5/5    Left:     Upper extremity   5/5  Lower extremity   5/5     Lower extremity   5/5 Tone and bulk:normal tone throughout; no atrophy noted Sensory: Pinprick and light touch intact throughout, bilaterally Deep Tendon Reflexes: 2+ and symmetric throughout Plantars: Right: mute   Left: mute Cerebellar: normal finger-to-nose and normal heel-to-shin testing bilaterally.  Some jerkiness noted in the upper extremities   Laboratory Studies:  Basic Metabolic Panel:  Recent Labs Lab 12/08/14 2203  NA 134*  K 3.7  CL 100*  CO2 25  GLUCOSE 144*  BUN 12  CREATININE 1.16  CALCIUM 8.8*    Liver Function Tests: No results for  input(s): AST, ALT, ALKPHOS, BILITOT, PROT, ALBUMIN in the last 168 hours. No results for input(s): LIPASE, AMYLASE in the last 168 hours. No results for input(s): AMMONIA in the last 168 hours.  CBC:  Recent Labs Lab 12/08/14 2203  WBC 7.0  NEUTROABS 5.7  HGB 14.3  HCT 41.9  MCV 90.7  PLT 189    Cardiac Enzymes: No results for input(s): CKTOTAL, CKMB, CKMBINDEX, TROPONINI in the last 168 hours.  BNP: Invalid input(s): POCBNP  CBG: No results for input(s): GLUCAP in the last 168 hours.  Microbiology: Results for orders placed or performed during the hospital encounter of 09/08/11  Urine culture     Status: None   Collection Time: 09/08/11  4:08 PM  Result Value Ref Range Status   Specimen Description URINE, CLEAN CATCH  Final   Special Requests NONE ADD ON 2146 7.16.13  Final   Culture  Setup Time 09/09/2011 03:39  Final   Colony Count NO GROWTH  Final   Culture NO GROWTH  Final   Report Status 09/10/2011 FINAL  Final    Coagulation Studies: No results for input(s): LABPROT, INR in the last 72 hours.  Urinalysis: No results for input(s): COLORURINE, LABSPEC, PHURINE, GLUCOSEU, HGBUR, BILIRUBINUR, KETONESUR, PROTEINUR, UROBILINOGEN, NITRITE, LEUKOCYTESUR in the last 168 hours.  Invalid input(s): APPERANCEUR  Lipid Panel:    Component Value Date/Time   CHOL 146 05/05/2014 0440   TRIG 75 05/05/2014 0440   HDL 46 05/05/2014 0440   CHOLHDL 3.2 05/05/2014 0440   VLDL 15 05/05/2014 0440   LDLCALC 85 05/05/2014 0440    HgbA1C:  Lab Results  Component Value Date   HGBA1C 5.4 05/05/2014    Urine Drug Screen:  No results found for: LABOPIA, COCAINSCRNUR, LABBENZ, AMPHETMU, THCU, LABBARB  Alcohol Level: No results for input(s): ETH in the last 168 hours.  Other results: EKG: sinus tachycardia at 101 bpm  Imaging: Dg Chest 2 View  12/08/2014  CLINICAL DATA:  Acute onset chest pain today. Coronary artery disease. Previous myocardial infarction and congestive  heart failure. EXAM: CHEST  2 VIEW COMPARISON:  06/13/2014 FINDINGS: Heart size is within normal limits. Both lungs are clear. No evidence of pleural effusion or pneumothorax. Single lead AICD remains in appropriate position. IMPRESSION: No active cardiopulmonary disease. Electronically Signed   By: Myles Rosenthal M.D.   On: 12/08/2014 22:56   Ct Head Wo Contrast  12/08/2014  CLINICAL DATA:  Confusion, speech difficulty EXAM: CT HEAD WITHOUT CONTRAST TECHNIQUE: Contiguous axial images were obtained from the base of the skull through the vertex without intravenous contrast. COMPARISON:  09/01/2006 FINDINGS: There is no evidence of mass effect, midline shift or extra-axial fluid collections. There is no evidence of a space-occupying lesion or intracranial  hemorrhage. There is no evidence of a cortical-based area of acute infarction. The ventricles and sulci are appropriate for the patient's age. The basal cisterns are patent. Visualized portions of the orbits are unremarkable. The visualized portions of the paranasal sinuses and mastoid air cells are unremarkable. The osseous structures are unremarkable. IMPRESSION: No acute intracranial pathology. Electronically Signed   By: Elige KoHetal  Patel   On: 12/08/2014 23:04    Assessment: 59 y.o. male presenting with an episode of what is described as aphasia.  Patient is now back to baseline.  Concern is for TIA since patient with a cardiomyopathy and not compliant with antiplatelet therapy.  Head CT personally reviewed and shows no evidence of of acute changes.  Patient unable to have MRI so small acute infarct can not be ruled out.  Seizure is in the differential as well.  Further work up recommended.    Stroke Risk Factors - hyperlipidemia, hypertension and smoking  Plan: 1. HgbA1c, fasting lipid panel 2. EEG 3. PT consult, OT consult, Speech consult 4. Echocardiogram 5. Carotid dopplers 6. Prophylactic therapy-Resume ASA and Plavix 7. NPO until RN stroke  swallow screen 8. Telemetry monitoring 9. Frequent neuro checks  Case discussed with Dr. Cecil Crankeranford  Jullian Previti, MD [facility] [phone] 12/09/2014, 1:32 AM

## 2014-12-09 NOTE — H&P (Signed)
History and Physical  Patient Name: [name]     WGN:[id number]    DOB: 08-04-1955    DOA: 12/08/2014 Referring physician: Emilia Beck, PA-C PCP: Pcp Not In System      Chief Complaint: Word finding difficulty transient  HPI: [name] is a 59 y.o. male with a past medical history significant for ischemic CM with ICD and last EF 33% in 2014, depression with passive suicidality, active smoking, HTN who presents with transient aphasia.  History is collected from the patient and his daughters both. The patient was in his normal state of health until this evening after supper when he was about to play video games. He was alone. He reports that he became confused, could "see but couldn't see", and couldn't spell the letters in the password on his computer.  He called his daughters kept repeating "I can't remember my password" no matter what they asked. By the time the daughters arrived, EMS were already present in the patient was complaining of chest discomfort. He was given nitroglycerin and transported to the ED  In the ED, the patient was tachycardic, had some chest discomfort, but his screening laboratory studies and initial troponin were all negative.   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 They also report that he has made threats to his estranged wife of committing suicide with nitroglycerin recently.     Review of Systems:  Patient seen 12:55 AM on 12/09/2014. Pt complains of difficulty finding words, difficulty spelling, confusion, chest discomfort. Pt denies any focal weakness, falling, ataxia, headache, slurred speech.  All other systems negative except as just noted or noted in the history of present illness.  No Known Allergies  Prior to Admission medications   Medication Sig Start  Date End Date Taking? Authorizing Provider  nitroGLYCERIN (NITROSTAT) 0.4 MG SL tablet Place 1 tablet (0.4 mg total) under the tongue every 5 (five) minutes as needed. For chest pain 05/05/14  Yes Dwana Melena, PA-C  aspirin EC 81 MG tablet Take 81 mg by mouth daily.    Historical Provider, MD  atorvastatin (LIPITOR) 40 MG tablet Take 1 tablet (40 mg total) by mouth daily at 6 PM. Patient not taking: Reported on 12/08/2014 05/05/14   Dwana Melena, PA-C  carvedilol (COREG) 12.5 MG tablet Take 1 tablet (12.5 mg total) by mouth daily. Take  1 tablets  daily with meals Patient not taking: Reported on 12/08/2014 05/05/14   Dwana Melena, PA-C  clopidogrel (PLAVIX) 75 MG tablet Take 1 tablet (75 mg total) by mouth daily. Take one table by mouth everyday with breakfast. Patient not taking: Reported on 12/08/2014 05/05/14   Dwana Melena, PA-C  lisinopril (PRINIVIL,ZESTRIL) 20 MG tablet Take 1 tablet (20 mg total) by mouth daily. Patient not taking: Reported on 12/08/2014 05/05/14   Corrie Dandy    Past Medical History  Diagnosis Date  . HTN (hypertension)   . HLD (hyperlipidemia) mixed  . Ischemic cardiomyopathy     a.  EF 30-35% 2010;  b.  EF 20-25% by LV gram 08/2012.  [name] CAD (coronary artery disease)     a.  Severe LAD stenosis 2/2 acute thrombus - BMS 2009;  b. 06/01/11 Cath - LAD 20 isr, 71m (3.0x16 Veri-flex BMS), OM1 100p, EF 20-25%;  c. 07/2012 Abnl Cardiolite;  d. 08/2012 Cath/PCI: LM nl, LAD patent stents, D2 80-90 (jailed), LCX 90 p/m (4.0x24 Promus Premier DES), OM1 100, OM2 80-90p (3.0x20 Promus Premier DES), RCA patent mid stent, 40d, EF 25%.  . Anxiety   . Paroxysmal ventricular tachycardia (HCC)     a. VFlutter  CL 210 msec  Rx shock 04/2011  . Systolic CHF, chronic (HCC)   . Automatic implantable cardioverter-defibrillator in situ   . Heart murmur     "as a child" (08/29/2012)  . History of rheumatic fever 1962  . Anginal pain (HCC)     "only w/my MI" (08/29/2012)  . Myocardial  infarction [facility](HCC) 1998; 2002; ~ 2010    Past Surgical History  Procedure Laterality Date  . Defibrillator implantation  04/12/02    primary prevention for sudden death; Dr. Graciela HusbandsKlein   . Cardiac defibrillator placement      primary prevention for sudden death; Dr. Graciela HusbandsKlein   . Cardiac catheterization      "lots of those" (08/29/2012)  . Coronary angioplasty with stent placement  2002-08/29/2012    "think I had 4 total then 2 today" to LAD 06/01/2011/notes (08/29/2012)  . Laparoscopic cholecystectomy    . Hernia repair      "w/gallbladder OR" (08/29/2012)  . Percutaneous coronary stent intervention (pci-s) N/A 06/01/2011    Procedure: PERCUTANEOUS CORONARY STENT INTERVENTION (PCI-S);  Surgeon: Peter M SwazilandJordan, MD;  [facility];  Service: Cardiovascular;  Laterality: N/A;  . Left heart catheterization with coronary angiogram N/A 06/08/2011    Procedure: LEFT HEART CATHETERIZATION WITH CORONARY ANGIOGRAM;  Surgeon: Dolores Pattyaniel R Bensimhon, MD;  [facility];  Service: Cardiovascular;  Laterality: N/A;  . Percutaneous coronary stent intervention (pci-s) N/A 08/29/2012    Procedure: PERCUTANEOUS CORONARY STENT INTERVENTION (PCI-S);  Surgeon: Peter M SwazilandJordan, MD;  [facility];  Service: Cardiovascular;  Laterality: N/A;    Family history: family history includes Cancer in his father, sister, and another family member; Hypertension in his brother.  No family history of stroke.  Social History: Patient lives alone.  He has a remote history of methamphetamine use. He works full-time for [employer]. He is going through separation from his wife. He is an active smoker.       Physical Exam: BP 105/60 mmHg  Pulse 82  Temp(Src) 97.9 F (36.6 C) (Oral)  Resp 17  SpO2 97% General appearance: Thin adult male, alert and in no acute distress.   Eyes: Anicteric, conjunctiva pink, lids and lashes normal.     ENT: No nasal deformity, discharge, or epistaxis.  OP moist without lesions.   Edentulous. Skin: Warm and dry.  No jaundice.  No suspicious rashes or lesions. Cardiac: RRR, nl S1-S2, no murmurs appreciated.  Capillary refill is brisk.  No LE edema.  Radial pulses 2+ and symmetric. Respiratory: Normal respiratory rate and rhythm.  CTAB without rales or wheezes. Abdomen: Abdomen soft without rigidity.  No TTP. No ascites, distension.   MSK: No deformities or effusions. Neuro: Pupils are 4 mm and reactive to 3 mm. Extraocular movements are intact, without nystagmus. Cranial nerve 5 is within normal limits. Cranial nerve 7 is symmetrical. Cranial nerve 8 is within normal limits. Cranial nerves 9 and 10 reveal equal palate elevation. Cranial nerve 11 reveals sternocleidomastoid strong. Cranial nerve 12 is midline. I do not note a deficit in motor strength testing in the upper and lower extremities bilaterally with normal motor, tone and bulk. No pronator drift.  Patellar reflexes 2+ and symmetric. Finger-to-nose testing has possible subtle dysmetria on the right. The patient is oriented to time, place and person. Speech is fluent. Naming is grossly intact. Recall, recent and remote, as well as general fund of knowledge seem within normal limits. Attention span and concentration are within normal limits.   Psych: Behavior appropriate at this time.  Affect very flat.  No evidence of aural or visual hallucinations or delusions.       Labs on Admission:  The metabolic panel shows normal sodium, potassium, serum creatinine. The initial troponin is negative. The complete blood count shows no evidence of leukocytosis or thrombocytopenia.   Radiological Exams on Admission: CT head without contrast 12/09/2014  No acute intracranial process.  Personally reviewed: Dg Chest 2 View 12/08/2014  C Clear   EKG: Independently reviewed. Sinus tachycardia with old IVCD.    Assessment/Plan   1. Possible TIA:  This is new.  This clinically does sound like a transient  ischemic event. Possibly related drug use or thrombosis given his nonadherence.  The patient cannot get an MRI because of his ICD. Will workup for etiology, and restart home medicines.  Discussed with Dr. Thad Ranger of Neurology. -Admit to telemetry -Neuro checks, NIHSS per protocol -Resume daily aspirin and clopidogrel -Lipids, hemoglobin A1c, urine drug screen -Carotid dopplers ordered -Echocardiogram ordered -EEG -Consult to Neurology, appreciate recommendations  2. Medication nonadherence: The patient manages his own medicines and his daughters feel he is not taking any of his medicines and months. His last cardiology appointment was 3 days ago and his cardiologist thought that he was taking:  Baby aspirin, statin, clopidogrel, carvedilol, furosemide 20 mg daily, and lisinopril.  3. Ischemic CM:  Clinically stable. -Restart beta blocker and ACE when able  4. HTN:  Blood pressure somewhat soft on admission. -Hold home furosemide, carvedilol, lisinopril and restart when able  5. Depression and alleged suicidality:  Consult to psychiatry for safety evaluation and drug abuse counseling      DVT PPx: Lovenox Diet: Heart healthy after swallow screen Consultants: Neurology Code Status: Full Family Communication: The diagnosis and expected plan of care were dsicussed with the family at the bedside.  All questions were answered.  Code status was discussed.   Disposition Plan:  Will admit for possible TIA.  Stroke work up as above and consult to ancillary services.  Expect discharge within 1-2 days.  At the point of initial evaluation, it is my clinical opinion that admission for OBSERVATION is reasonable and necessary because the patient's presenting complaints in the context of their chronic conditions represent sufficient risk of deterioration or significant morbidity to constitute reasonable grounds for close observation in the hospital setting, but that the patient may be medically  stable for discharge from the hospital within 24 to 48 hours.       Alberteen Sam [facility] Pager [phone]

## 2014-12-10 ENCOUNTER — Observation Stay (HOSPITAL_COMMUNITY): Payer: MEDICAID

## 2014-12-10 LAB — HEMOGLOBIN A1C
HEMOGLOBIN A1C: 5.5 % (ref 4.8–5.6)
MEAN PLASMA GLUCOSE: 111 mg/dL

## 2014-12-10 MED ORDER — BUSPIRONE HCL 5 MG PO TABS
5.0000 mg | ORAL_TABLET | Freq: Two times a day (BID) | ORAL | Status: DC
  Administered 2014-12-10 – 2014-12-11 (×3): 5 mg via ORAL
  Filled 2014-12-10 (×3): qty 1

## 2014-12-10 NOTE — Progress Notes (Signed)
OT Cancellation Note  Patient Details Name: Crista LuriaLewis R Trice MRN: [id number] DOB: 07/28/1955   Cancelled Treatment:    Reason Eval/Treat Not Completed: Patient at procedure or test/ unavailable  Earlie RavelingStraub, Aleeta Schmaltz L OTR/L [phone] 12/10/2014, 9:35 AM

## 2014-12-10 NOTE — Clinical Social Work Note (Signed)
Outpatient mental health resources provided for patient as well as instruction on how to get treatment. Patient plans to return home with his sister. He denies any suicidal or homicidal ideation. CSW signing off at this time.    Roddie McBryant Khloei Spiker MSW, [place]CSW, [place], [phone]

## 2014-12-10 NOTE — Progress Notes (Signed)
[name] ZOX:[id number] DOB: 12/25/1955 DOA: 12/08/2014 PCP: Pcp Not In System  Brief narrative: 59 y/o ? admti 3/11-3/12/16 ROMI CAD-stent 1998, AMI 2002 with LAD stent, UA 08/2004, AMI 04/2007, PCI 06/01/11 Resulting Isch CM-LAst Myoview 08/07/14 EF 25% Afib diag 2002 after MI-Placement of Medtronic single chamber CV-Defib childhood rh Fever Htn HLD LBP Wife left him recently and has claimed SI  Has been noncompliant on medications since past couple of months  Admitted to hospital with episodic shortness of breath and chest pain. Noted that he had some confusion could see but could not read clearly and could not spell out in his password Had some echolalia  Psychiatry saw the patient neurology saw the patient   Past medical history-As per Problem list Chart reviewed as below-   Consultants:  Renal  Cardiology  Neurology  Procedures:  EEG pending  Antibiotics:  none   Subjective   Alert oriented no new issues No chest pain No nausea No vomiting No shortness of breath   Objective    Interim History:   Telemetry:    Objective: Filed Vitals:   12/09/14 1955 12/09/14 2205 12/09/14 2335 12/10/14 0540  BP: 86/44 105/62 93/76 116/66  Pulse: 74 91 101 95  Temp: 97.8 F (36.6 C)  97.9 F (36.6 C) 97.8 F (36.6 C)  TempSrc: Oral  Oral   Resp: 16 16 16 16   Height:      Weight:    90.855 kg (200 lb 4.8 oz)  SpO2: 96% 97% 96% 96%    Intake/Output Summary (Last 24 hours) at 12/10/14 1342 Last data filed at 12/10/14 0800  Gross per 24 hour  Intake    227 ml  Output      0 ml  Net    227 ml    Exam:  General: eomi, ncat Cardiovascular: S1-S2 no murmur rub or gallop Respiratory: Clinically clear no added sound Abdomen: Soft nontender nondistended no rebound Skin no lower extremity edema Neuro intact moving all no focal deficit no unilateral weakness no numbness power 5/5, reflexes 2/3  Data Reviewed: Basic Metabolic Panel:  Recent  Labs Lab 12/08/14 2203  NA 134*  K 3.7  CL 100*  CO2 25  GLUCOSE 144*  BUN 12  CREATININE 1.16  CALCIUM 8.8*   Liver Function Tests: No results for input(s): AST, ALT, ALKPHOS, BILITOT, PROT, ALBUMIN in the last 168 hours. No results for input(s): LIPASE, AMYLASE in the last 168 hours. No results for input(s): AMMONIA in the last 168 hours. CBC:  Recent Labs Lab 12/08/14 2203  WBC 7.0  NEUTROABS 5.7  HGB 14.3  HCT 41.9  MCV 90.7  PLT 189   Cardiac Enzymes:  Recent Labs Lab 12/09/14 0310 12/09/14 0620 12/09/14 0940  TROPONINI <0.03 <0.03 <0.03   BNP: Invalid input(s): POCBNP CBG: No results for input(s): GLUCAP in the last 168 hours.  No results found for this or any previous visit (from the past 240 hour(s)).   Studies:              All Imaging reviewed and is as per above notation   Scheduled Meds: .  stroke: mapping our early stages of recovery book   Does not apply Once  . aspirin EC  81 mg Oral Daily  . atorvastatin  80 mg Oral q1800  . clopidogrel  75 mg Oral Daily  . enoxaparin (LOVENOX) injection  40 mg Subcutaneous Q24H  . sertraline  50 mg Oral Daily  Continuous Infusions:    Assessment/Plan:  1. CVA-CT angiogram head [cannot get MRI secondary to pacemaker] showed no stroke. Patient noncompliant on aspirin 81, Plavix 75. Echocardiogram showed decreased EF 15% and hence cardiology consulted for interrogation, feasibility of Coumadin.   2. Ischemic cardiomyopathy-EF 25% 07/2014-->15% 11/2058. Continue aspirin and Plavix. Currently on no meds from home such as lisinopril 20, metoprolol XL 50 as blood pressure would not support these. Defer to cardiology for further management 3. Paroxysmal atrial fibrillation status post placement Medtronic PPM-continue monitoring on telemetry for now 4. Hypertension-see above discussion 5. Situational depression-psychiatry consulted as patient very depressed. Prescribe sertraline 50 daily-outpatient facility  resources per social work 6. ? Methamphetamine abuse-unclear if using now. Need to inquire 7. Hyperlipidemia-continue atorvastatin 80 daily   Code Status: Full code Family Communication: family not present today at the bedside Disposition Plan: home health PT as needed DVT prophylaxis: SCD Consultants: Cardiology, neurology, psychiatry  Pleas Koch, MD  [facility] Pager [phone] 12/10/2014, 1:42 PM

## 2014-12-10 NOTE — Evaluation (Signed)
Speech Language Pathology Evaluation Patient Details Name: Crista LuriaLewis R Ebersole MRN: [id number] DOB: 10/05/1955 Today's Date: 12/10/2014 Time: [id number] SLP Time Calculation (min) (ACUTE ONLY): 23 min  Problem List:  Patient Active Problem List   Diagnosis Date Noted  . Generalized anxiety disorder 12/09/2014  . Major depressive disorder, recurrent episode (HCC) 12/09/2014  . TIA (transient ischemic attack)   . HLD (hyperlipidemia)   . Aphasia 05/04/2014  . Unstable angina (HCC) 06/02/2011  . CAD (coronary artery disease)   . Ischemic cardiomyopathy   . Automatic implantable cardioverter-defibrillator in situ   . TOBACCO ABUSE 02/04/2010  . VENTRICULAR TACHYCARDIA 08/21/2008  . HYPERLIPIDEMIA-MIXED 06/04/2008  . ANXIETY 06/04/2008  . Essential hypertension 06/04/2008  . CARDIOMYOPATHY, ISCHEMIC 06/04/2008  . CHEST PAIN-UNSPECIFIED 06/04/2008   Past Medical History:  Past Medical History  Diagnosis Date  . HTN (hypertension)   . HLD (hyperlipidemia) mixed  . Ischemic cardiomyopathy     a.  EF 30-35% 2010;  b.  EF 20-25% by LV gram 08/2012.  [name]. CAD (coronary artery disease)     a.  Severe LAD stenosis 2/2 acute thrombus - BMS 2009;  b. 06/01/11 Cath - LAD 20 isr, 6941m (3.0x16 Veri-flex BMS), OM1 100p, EF 20-25%;  c. 07/2012 Abnl Cardiolite;  d. 08/2012 Cath/PCI: LM nl, LAD patent stents, D2 80-90 (jailed), LCX 90 p/m (4.0x24 Promus Premier DES), OM1 100, OM2 80-90p (3.0x20 Promus Premier DES), RCA patent mid stent, 40d, EF 25%.  . Anxiety   . Paroxysmal ventricular tachycardia (HCC)     a. VFlutter  CL 210 msec  Rx shock 04/2011  . Systolic CHF, chronic (HCC)   . Automatic implantable cardioverter-defibrillator in situ   . Heart murmur     "as a child" (08/29/2012)  . History of rheumatic fever 1962  . Anginal pain (HCC)     "only w/my MI" (08/29/2012)  . Myocardial infarction [facility](HCC) 1998; 2002; ~ 2010  . Depression    Past Surgical History:  Past Surgical History  Procedure Laterality  Date  . Defibrillator implantation  04/12/02    primary prevention for sudden death; Dr. Graciela HusbandsKlein   . Cardiac defibrillator placement      primary prevention for sudden death; Dr. Graciela HusbandsKlein   . Cardiac catheterization      "lots of those" (08/29/2012)  . Coronary angioplasty with stent placement  2002-08/29/2012    "think I had 4 total then 2 today" to LAD 06/01/2011/notes (08/29/2012)  . Laparoscopic cholecystectomy    . Hernia repair      "w/gallbladder OR" (08/29/2012)  . Percutaneous coronary stent intervention (pci-s) N/A 06/01/2011    Procedure: PERCUTANEOUS CORONARY STENT INTERVENTION (PCI-S);  Surgeon: Peter M SwazilandJordan, MD;  [facility];  Service: Cardiovascular;  Laterality: N/A;  . Left heart catheterization with coronary angiogram N/A 06/08/2011    Procedure: LEFT HEART CATHETERIZATION WITH CORONARY ANGIOGRAM;  Surgeon: Dolores Pattyaniel R Bensimhon, MD;  [facility];  Service: Cardiovascular;  Laterality: N/A;  . Percutaneous coronary stent intervention (pci-s) N/A 08/29/2012    Procedure: PERCUTANEOUS CORONARY STENT INTERVENTION (PCI-S);  Surgeon: Peter M SwazilandJordan, MD;  [facility];  Service: Cardiovascular;  Laterality: N/A;   HPI:  Crista LuriaLewis R Habibi is a 59 y.o. male with a past medical history significant for ischemic CM with ICD, depression with passive suicidality, active smoking, HTN who presents with transient aphasia. CT no acute intracranial pathology. Suspect possible TIA.   Assessment / Plan / Recommendation Clinical Impression  Pt assessed  in the areas of speech-language-cognition. No difficulty with naming, computations, visuospatial information. Able to recall his passwords for various devices. Reading comprehension WFL's. Four word recall 3/5 independently. Pt reports improvements in language. No further ST recommended at this time.       SLP Assessment  Patient does not need any further Speech Lanaguage Pathology Services    Follow Up Recommendations  None     Frequency and Duration        Pertinent Vitals/Pain Pain Assessment: No/denies pain   SLP Goals     SLP Evaluation Prior Functioning  Cognitive/Linguistic Baseline: Within functional limits Type of Home: House   Cognition  Overall Cognitive Status: Within Functional Limits for tasks assessed Orientation Level: Oriented X4 Safety/Judgment: Appears intact    Comprehension  Auditory Comprehension Overall Auditory Comprehension: Appears within functional limits for tasks assessed Visual Recognition/Discrimination Discrimination: Not tested Reading Comprehension Reading Status: Within funtional limits    Expression Expression Primary Mode of Expression: Verbal Verbal Expression Overall Verbal Expression: Appears within functional limits for tasks assessed Written Expression Dominant Hand: Right Written Expression: Not tested   Oral / Motor Oral Motor/Sensory Function Overall Oral Motor/Sensory Function: Appears within functional limits for tasks assessed Motor Speech Overall Motor Speech: Appears within functional limits for tasks assessed Intelligibility: Intelligible   GO Functional Assessment Tool Used:  (skilled clinical judgement) Functional Limitations: Spoken language expressive Spoken Language Expression Current Status (U1324): 0 percent impaired, limited or restricted Spoken Language Expression Goal Status (M0102): 0 percent impaired, limited or restricted Spoken Language Expression Discharge Status [phone]): 0 percent impaired, limited or restricted   Royce Macadamia 12/10/2014, 1:50 PM  Breck Coons Kiley Torrence M.Ed [employer] [phone]

## 2014-12-10 NOTE — Progress Notes (Signed)
EEG completed, results pending. 

## 2014-12-10 NOTE — Progress Notes (Signed)
STROKE TEAM PROGRESS NOTE   HISTORY [name] is an 59 y.o. male with a history of cardiomyopathy, ongoing tobacco use, and noncompliance with medications, who reports that this evening while at home he had an episode when he could not get his words out. He was unable to remember his codes for his video games and could not read or write. He was able to contact his daughters who called EMS. Symptoms lasted about an hour and resolved spontaneously. It appears that the patient had some chest pain as well. He is now being admitted for further work up.   Date last known well: Date: 12/08/2014 Time last known well: Time: 19:00 tPA Given: No: Resolution of symptoms    SUBJECTIVE (INTERVAL HISTORY) No family members present. The patient feels back to baseline. He denies recent drug use. He had not been taking his medications as prescribed. He clearly describes episode of expressive aphasia and difficulty with reading understanding written language. He has not been compliant with his medications.    OBJECTIVE Temp:  [97.8 F (36.6 C)-98.4 F (36.9 C)] 98.4 F (36.9 C) (10/17 1350) Pulse Rate:  [74-101] 83 (10/17 1350) Cardiac Rhythm:  [-] Normal sinus rhythm (10/17 0800) Resp:  [16-18] 18 (10/17 1350) BP: (86-116)/(44-76) 105/55 mmHg (10/17 1350) SpO2:  [96 %-98 %] 98 % (10/17 1350) Weight:  [200 lb 4.8 oz (90.855 kg)] 200 lb 4.8 oz (90.855 kg) (10/17 0540)  CBC:   Recent Labs Lab 12/08/14 2203  WBC 7.0  NEUTROABS 5.7  HGB 14.3  HCT 41.9  MCV 90.7  PLT 189    Basic Metabolic Panel:   Recent Labs Lab 12/08/14 2203  NA 134*  K 3.7  CL 100*  CO2 25  GLUCOSE 144*  BUN 12  CREATININE 1.16  CALCIUM 8.8*    Lipid Panel:     Component Value Date/Time   CHOL 183 12/09/2014 0310   TRIG 61 12/09/2014 0310   HDL 41 12/09/2014 0310   CHOLHDL 4.5 12/09/2014 0310   VLDL 12 12/09/2014 0310   LDLCALC 130* 12/09/2014 0310   HgbA1c:  Lab Results  Component Value Date    HGBA1C 5.5 12/09/2014   Urine Drug Screen:     Component Value Date/Time   LABOPIA NONE DETECTED 12/09/2014 0315   COCAINSCRNUR NONE DETECTED 12/09/2014 0315   LABBENZ NONE DETECTED 12/09/2014 0315   AMPHETMU POSITIVE* 12/09/2014 0315   THCU NONE DETECTED 12/09/2014 0315   LABBARB NONE DETECTED 12/09/2014 0315      IMAGING  Ct Angio Head and Neck W/cm &/or Wo Cm 12/09/2014   Mild atherosclerotic disease in the carotid bifurcation bilaterally. No significant carotid or vertebral artery stenosis in the neck. Mild atherosclerotic disease in the cavernous carotid bilaterally without significant stenosis. No significant intracranial stenosis or aneurysm. No acute intracranial abnormality.   Dg Chest 2 View 12/08/2014   No active cardiopulmonary disease.   Ct Head Wo Contrast 12/08/2014   No acute intracranial pathology.   2D echo -  - Left ventricle: Systolic function is severely reduced, estimated EF 15%. The cavity size was moderately dilated. Features are consistent with a pseudonormal left ventricular filling pattern, with concomitant abnormal relaxation and increased filling pressure (grade 2 diastolic dysfunction). Doppler parameters are consistent with high ventricular filling pressure. - Regional wall motion abnormality: Akinesis of the mid-apical anterior, apical inferior, mid anterolateral, apical lateral, and apical myocardium; severe hypokinesis of the mid anteroseptal, mid inferior, and apical septal myocardium; moderate hypokinesis of the  basal anteroseptal, basal inferior, and basal-mid inferolateral myocardium. - Aortic valve: Mildly calcified annulus. Trileaflet. There was mild to moderate regurgitation. - Mitral valve: There was moderate regurgitation. - Left atrium: The atrium was severely dilated. Volume/bsa, ES, (1-plane Simpson&'s, A2C): 48.1 ml/m^2. - Right ventricle: Pacer wire or catheter noted in right ventricle. Systolic  function was mildly reduced. - Right atrium: The atrium was mildly dilated.   PHYSICAL EXAM  Temp:  [97.8 F (36.6 C)-98.4 F (36.9 C)] 98.4 F (36.9 C) (10/17 1350) Pulse Rate:  [74-101] 83 (10/17 1350) Resp:  [16-18] 18 (10/17 1350) BP: (86-116)/(44-76) 105/55 mmHg (10/17 1350) SpO2:  [96 %-98 %] 98 % (10/17 1350) Weight:  [200 lb 4.8 oz (90.855 kg)] 200 lb 4.8 oz (90.855 kg) (10/17 0540)  General - Well nourished, well developed, in no apparent distress.  Ophthalmologic - Sharp disc margins OU.   Cardiovascular - Regular rate and rhythm.  Mental Status -  Level of arousal and orientation to time, place, and person were intact. Language including expression, naming, repetition, comprehension was assessed and found intact. Fund of Knowledge was assessed and was intact.  Cranial Nerves II - XII - II - Visual field intact OU. III, IV, VI - Extraocular movements intact. V - Facial sensation intact bilaterally. VII - Facial movement intact bilaterally. VIII - Hearing & vestibular intact bilaterally. X - Palate elevates symmetrically. XI - Chin turning & shoulder shrug intact bilaterally. XII - Tongue protrusion intact.  Motor Strength - The patient's strength was normal in all extremities and pronator drift was absent.  Bulk was normal and fasciculations were absent.   Motor Tone - Muscle tone was assessed at the neck and appendages and was normal.  Reflexes - The patient's reflexes were 1+ in all extremities and he had no pathological reflexes.  Sensory - Light touch, temperature/pinprick were assessed and were symmetrical.    Coordination - The patient had normal movements in the hands and feet with no ataxia or dysmetria.  Tremor was absent  Gait and Station - The patient's transfers, posture, gait, station, and turns were observed as normal.   ASSESSMENT/PLAN [name] is a 59 y.o. male with history of hypertension, noncompliant with medications, ongoing  tobacco use, history of substance abuse, hyperlipidemia, ischemic cardiomyopathy, coronary artery disease, paroxysmal ventricular tachycardia, congestive heart failure s/p AICD, and previous MI presenting with speech difficulties. He did not receive IV t-PA due to resolution of deficits.  Suspected left brain TIA vs. Stroke - likely embolic due to severe cardiomyopathy.  Resultant  resolution of deficits  MRI / MRA not performed due to AICD  CTA head and neck - mild asthero  2D Echo  EF 15%  EEG - pending  Recommend AICD interrogation to rule out afib.  LDL 130  HgbA1c  5.5  VTE prophylaxis - Lovenox Diet Heart Room service appropriate?: Yes; Fluid consistency:: Thin  aspirin 81 mg orally every day and clopidogrel 75 mg orally every day prior to admission but noncompliance, now on aspirin 81 mg orally every day and clopidogrel 75 mg orally every day. Due to severe cardiomyopathy likely the cause of stroke, we recommend anticoagulation with coumadin.  Patient counseled to be compliant with his antithrombotic medications  Ongoing aggressive stroke risk factor management  Therapy recommendations: Pending  Disposition: Pending  Severe cardiomyopathy  EF 20-25% in the past  Did not follow up with cardiology regularly  Recommend cardiology consult  Recommend AICD interrogation to rule out afib.  Due to severe cardiomyopathy likely the cause of stroke, we recommend anticoagulation with coumadin.  Repeat 2D echo in 2-3 months after treatment, if EF >= 30%, pt can be off coumadin.  Hypertension  Blood pressure tends to run low - ischemic cardiomyopathy - currently off all antihypertensive medications.  Permissive hypertension (OK if < 220/120) but gradually normalize in 5-7 days  Hyperlipidemia  Home meds:  Atorvastatin 40 mg daily resumed in the hospital  LDL 130, goal < 70  Increase Lipitor to 80 mg daily  Continue statin at discharge  Tobacco abuse  Current  smoker  Smoking cessation counseling provided  Pt is willing to quit  Other Stroke Risk Factors  Advanced age  Coronary artery disease s/p stent  S/p AICD  Other Active Problems  UDS - positive for amphetamines  Hospital day # 1 The patient is not reliable long-term anticoagulation candidate given history of noncompliance and unless the echocardiogram shows a definite clot or cardiologist strongly feels anticoagulation is necessary  We  recommend aspirin. Delia HeadyPramod Sethi, MD Stroke Neurology 12/10/2014 4:50 PM     To contact Stroke Continuity provider, please refer to [email or web address]. After hours, contact General Neurology

## 2014-12-11 LAB — COMPREHENSIVE METABOLIC PANEL
ALT: 16 U/L — AB (ref 17–63)
ANION GAP: 7 (ref 5–15)
AST: 20 U/L (ref 15–41)
Albumin: 3.4 g/dL — ABNORMAL LOW (ref 3.5–5.0)
Alkaline Phosphatase: 34 U/L — ABNORMAL LOW (ref 38–126)
BUN: 11 mg/dL (ref 6–20)
CHLORIDE: 100 mmol/L — AB (ref 101–111)
CO2: 28 mmol/L (ref 22–32)
Calcium: 8.9 mg/dL (ref 8.9–10.3)
Creatinine, Ser: 1.07 mg/dL (ref 0.61–1.24)
Glucose, Bld: 99 mg/dL (ref 65–99)
Potassium: 4.1 mmol/L (ref 3.5–5.1)
SODIUM: 135 mmol/L (ref 135–145)
TOTAL PROTEIN: 6.3 g/dL — AB (ref 6.5–8.1)
Total Bilirubin: 0.5 mg/dL (ref 0.3–1.2)

## 2014-12-11 LAB — CBC
HCT: 42 % (ref 39.0–52.0)
Hemoglobin: 14.2 g/dL (ref 13.0–17.0)
MCH: 30.9 pg (ref 26.0–34.0)
MCHC: 33.8 g/dL (ref 30.0–36.0)
MCV: 91.5 fL (ref 78.0–100.0)
PLATELETS: 198 10*3/uL (ref 150–400)
RBC: 4.59 MIL/uL (ref 4.22–5.81)
RDW: 13.5 % (ref 11.5–15.5)
WBC: 4.1 10*3/uL (ref 4.0–10.5)

## 2014-12-11 LAB — MAGNESIUM: Magnesium: 2 mg/dL (ref 1.7–2.4)

## 2014-12-11 LAB — TSH: TSH: 1.739 u[IU]/mL (ref 0.350–4.500)

## 2014-12-11 MED ORDER — METOPROLOL TARTRATE 1 MG/ML IV SOLN
INTRAVENOUS | Status: AC
  Filled 2014-12-11: qty 5

## 2014-12-11 MED ORDER — METOPROLOL TARTRATE 1 MG/ML IV SOLN
2.5000 mg | Freq: Once | INTRAVENOUS | Status: AC
  Administered 2014-12-11: 2.5 mg via INTRAVENOUS

## 2014-12-11 MED ORDER — CARVEDILOL 3.125 MG PO TABS
3.1250 mg | ORAL_TABLET | Freq: Two times a day (BID) | ORAL | Status: DC
  Administered 2014-12-11 – 2014-12-12 (×2): 3.125 mg via ORAL
  Filled 2014-12-11 (×2): qty 1

## 2014-12-11 NOTE — Progress Notes (Signed)
Occupational Therapy Evaluation Patient Details Name: JADAN HINOJOS MRN: [id number] DOB: 1956/02/11 Today's Date: 12/11/2014    History of Present Illness 59 yo male with symptoms of depression and suicidal thoughts was admitted for workup of  aphasic changes, no acute neurological or cardiac event but EF 15% now.  Continuing to monitor him for neuro changes.   Clinical Impression   Very pleasant. Pt currently at baseline with ADL and mobility. No OT needs. OT signing off.     Follow Up Recommendations  No OT follow up;Supervision - Intermittent    Equipment Recommendations  None recommended by OT    Recommendations for Other Services       Precautions / Restrictions Precautions Precautions: None (telemetry) Restrictions Weight Bearing Restrictions: No      Mobility Bed Mobility Overal bed mobility: Independent                Transfers Overall transfer level: Independent                    Balance Overall balance assessment: No apparent balance deficits (not formally assessed)                                          ADL Overall ADL's : At baseline                                                             Pertinent Vitals/Pain Pain Assessment: No/denies pain     Hand Dominance Right   Extremity/Trunk Assessment Upper Extremity Assessment Upper Extremity Assessment: Overall WFL for tasks assessed   Lower Extremity Assessment Lower Extremity Assessment: Overall WFL for tasks assessed   Cervical / Trunk Assessment Cervical / Trunk Assessment: Normal   Communication Communication Communication: No difficulties   Cognition Arousal/Alertness: Awake/alert Behavior During Therapy: Flat affect Overall Cognitive Status: Within Functional Limits for tasks assessed                     General Comments       Exercises       Shoulder Instructions      Home Living Family/patient  expects to be discharged to:: Private residence Living Arrangements: Other relatives Available Help at Discharge: Family;Available PRN/intermittently Type of Home: House Home Access: Stairs to enter [employer] of Steps: 3 Entrance Stairs-Rails: Left Home Layout: One level     Bathroom Shower/Tub: Chief Strategy Officer: Standard Bathroom Accessibility: No   Home Equipment: Cane - single point          Prior Functioning/Environment Level of Independence: Independent (worked as Psychologist, forensic)        Comments: sister at home with him, takes care of her    OT Diagnosis: Generalized weakness   OT Problem List:     OT Treatment/Interventions:      OT Goals(Current goals can be found in the care plan section) Acute Rehab OT Goals Patient Stated Goal: to go home OT Goal Formulation: All assessment and education complete, DC therapy  OT Frequency:     Barriers to D/C:            Co-evaluation  End of Session Nurse Communication: Mobility status  Activity Tolerance: Patient tolerated treatment well Patient left: in chair;with call bell/phone within reach   Time: 1500-1515 OT Time Calculation (min): 15 min Charges:  OT General Charges $OT Visit: 1 Procedure OT Evaluation $Initial OT Evaluation Tier I: 1 Procedure G-Codes: OT G-codes **NOT FOR INPATIENT CLASS** Functional Assessment Tool Used: clinical judgement Functional Limitation: Self care Self Care Current Status (B2841(G8987): 0 percent impaired, limited or restricted Self Care Goal Status (L2440(G8988): 0 percent impaired, limited or restricted Self Care Discharge Status (N0272(G8989): 0 percent impaired, limited or restricted  Altheia Shafran,HILLARY 12/11/2014, 3:22 PM   [facility] Korianna Washer, OTR/L  [phone] 12/11/2014

## 2014-12-11 NOTE — Progress Notes (Signed)
[name] BersLewis R [name] WJX:[id number] DOB: 11/18/1955 DOA: 12/08/2014 PCP: Pcp Not In System  Brief narrative:  59 y/o ? admit 3/11-3/12/16 ROMI CAD-stent 1998, AMI 2002 with LAD stent, UA 08/2004, AMI 04/2007, PCI 06/01/11 Resulting Isch CM-LAst Myoview 08/07/14 EF 25% Afib diag 2002 after MI-Placement of Medtronic single chamber CV-Defib childhood rh Fever Htn HLD LBP Wife left him recently and has claimed SI  Has been noncompliant on medications since past couple of months  Admitted to hospital with episodic shortness of breath and chest pain. Noted that he had some confusion could see but could not read clearly and could not spell out in his password Had some echolalia  Psychiatry saw the patient neurology saw the patient  Had episodic VT 17 bts and cardiology consulted for eval of LOwEF [facility]    Past medical history-As per Problem list Chart reviewed as below-   Consultants:  Renal  Cardiology  Neurology  Procedures:  EEG pending  Antibiotics:  none   Subjective   Well No CP slightly sleepy No n/v/cp Had 17 vts VT and also 9 bts-given metoprolol    Objective    Interim History:   Telemetry:    Objective: Filed Vitals:   12/11/14 1350 12/11/14 1355 12/11/14 1410 12/11/14 1425  BP: 102/60 96/56 97/55  101/50  Pulse:      Temp:      TempSrc:      Resp:      Height:      Weight:      SpO2:        Intake/Output Summary (Last 24 hours) at 12/11/14 1549 Last data filed at 12/11/14 0755  Gross per 24 hour  Intake    600 ml  Output      0 ml  Net    600 ml    Exam:  General: eomi, ncat Cardiovascular: S1-S2 no murmur rub or gallop Respiratory: Clinically clear no added sound Abdomen: Soft nontender nondistended no rebound Skin no lower extremity edema Neuro intact moving all no focal deficit no unilateral weakness no numbness power 5/5, reflexes 2/3  Data Reviewed: Basic Metabolic Panel:  Recent Labs Lab 12/08/14 2203  12/11/14 0315  NA 134* 135  K 3.7 4.1  CL 100* 100*  CO2 25 28  GLUCOSE 144* 99  BUN 12 11  CREATININE 1.16 1.07  CALCIUM 8.8* 8.9  MG  --  2.0   Liver Function Tests:  Recent Labs Lab 12/11/14 0315  AST 20  ALT 16*  ALKPHOS 34*  BILITOT 0.5  PROT 6.3*  ALBUMIN 3.4*   No results for input(s): LIPASE, AMYLASE in the last 168 hours. No results for input(s): AMMONIA in the last 168 hours. CBC:  Recent Labs Lab 12/08/14 2203 12/11/14 0315  WBC 7.0 4.1  NEUTROABS 5.7  --   HGB 14.3 14.2  HCT 41.9 42.0  MCV 90.7 91.5  PLT 189 198   Cardiac Enzymes:  Recent Labs Lab 12/09/14 0310 12/09/14 0620 12/09/14 0940  TROPONINI <0.03 <0.03 <0.03   BNP: Invalid input(s): POCBNP CBG: No results for input(s): GLUCAP in the last 168 hours.  No results found for this or any previous visit (from the past 240 hour(s)).   Studies:              All Imaging reviewed and is as per above notation   Scheduled Meds: .  stroke: mapping our early stages of recovery book   Does not apply Once  .  aspirin EC  81 mg Oral Daily  . atorvastatin  80 mg Oral q1800  . carvedilol  3.125 mg Oral BID WC  . clopidogrel  75 mg Oral Daily  . enoxaparin (LOVENOX) injection  40 mg Subcutaneous Q24H  . metoprolol      . sertraline  50 mg Oral Daily   Continuous Infusions:    Assessment/Plan:  1. CVA-CT angiogram head [cannot get MRI secondary to pacemaker] showed no stroke. Patient noncompliant on aspirin 81, Plavix 75. Echocardiogram showed decreased EF 15% and hence cardiology consulted for interrogation, feasibility of Coumadin.  2. Non-Sust VT-17 bts vtach-cardiology to comment to comment on options-TSH/MG ordered and pending.  3. Ischemic cardiomyopathy-EF 25% 07/2014-->15% 11/2014. Continue aspirin and Plavix until Cardiology determines best course action. Currently on no meds from home such as lisinopril 20, metoprolol XL 50 as blood pressure would not support these. Cardiology started  Coreg 3.125 10/18  4. Paroxysmal atrial fibrillation status post placement Medtronic PPM-continue monitoring on telemetry for now 5. Hypertension-see above discussion 6. Situational depression-psychiatry consulted as patient very depressed. Prescribe sertraline 50 daily, trazodone 50 -Because of Vtac-, I have d/c Buspar transiently started 10/18-Rpt EKG in am for QTc 7. ? Methamphetamine abuse-unclear if using now.  8. Hyperlipidemia-continue atorvastatin 80 daily   Code Status: Full code Family Communication: family updated on telephone and understand Disposition Plan: home health PT as needed DVT prophylaxis: SCD Consultants: Cardiology, neurology, psychiatry  Pleas Koch, MD  [facility] Pager [phone] 12/11/2014, 3:49 PM

## 2014-12-11 NOTE — Procedures (Signed)
EEG report.  Brief clinical history:  59 y.o. male with a history of cardiomyopathy, ongoing tobacco use, and noncompliance with medications, who reports that this evening while at home he had an episode when he could not get his words out. He was unable to remember his codes for his video games and could not read or write. He was able to contact his daughters who called EMS. Symptoms lasted about an hour and resolved spontaneously  Technique: this is a 17 channel routine scalp EEG performed at the bedside with bipolar and monopolar montages arranged in accordance to the international 10/20 system of electrode placement. One channel was dedicated to EKG recording.  The study was performed during wakefulness, drowsiness, and stage 2 sleep. Intermittent photic stimulation was utilized as activating procedure.  Description:In the wakeful state, the best background consisted of a medium amplitude, posterior dominant, well sustained, symmetric and reactive 10 Hz rhythm. Sleep was not achieved. Intermittent photic stimulation did induce a normal driving response.  No focal or generalized epileptiform discharges noted.  No pathologic areas of slowing seen.  EKG showed sinus rhythm.  Impression: this is a normal awake EEG. Please, be aware that a normal EEG does not exclude the possibility of epilepsy.  Clinical correlation is advised.   Wyatt Portelasvaldo Camilo, MD [facility]

## 2014-12-11 NOTE — Progress Notes (Signed)
Pt had 17 beat run of V. Tach. Pt asymptomatic and VSS. EKG performed. Dr. Mahala MenghiniSamtani notified. Will continue to monitor.   Reginold AgentWhitney Devon Pretty, RN

## 2014-12-11 NOTE — Progress Notes (Signed)
Pt had 7 beat run of V. Tach. Pt asymptomatic and VSS. Dr. Mahala MenghiniSamtani notified. New orders received. Will continue to monitor.   Reginold AgentWhitney Mourad Cwikla, RN

## 2014-12-11 NOTE — Consult Note (Signed)
CARDIOLOGY CONSULT NOTE   Patient ID: [name] MRN: [id number] DOB/AGE: [birth year] 61 y.o.  Admit date: 12/08/2014  Primary Physician   Pcp Not In System Primary Cardiologist: Dr. Jens Som Primary Electrophysiologist: Dr. Graciela Husbands Reason for Consultation   Medication management for Low EF  HPI: [name] is a 59 y.o. male with a history of hypertension, hyperlipidemia, history of rheumatic fever, CAD status post multiple stents, ventricular tachycardia, ischemic cardiomyopathy and chronic systolic heart failure s/p Medtronic AICD and current tobacco abuse  who was admitted 12/09/14 for speech difficulties.   Patient had extensive hx of CAD s/p multiple cath.  Last cath 08/29/12 showed patent stents in LAD, 80-90% stenosis jailed D2, DES to 90% proximal and mid left circumflex, 100% OM1, 80-90% proximal OM 2 treated with DES, patent mid RCA stent, EF 25%.  Last admitted 04/2014 for chest pain r/o MI. He has been depressed since his wife left him few months ago. Seen by mental health.  Last Myoview 08/07/14 had an ejection fraction of 25%. Last seen by Dr. Graciela Husbands 12/06/14. At that time, his device was functioning normal.  The patient was in his normal state of health until 12/09/14 when he unable to find word and speaking difficulties. Workup in hospital suggestive of TIA/Stroke. He did not received tPA due to resolution of symptoms.   Cardiology is asked for evaluation of low EF 15% on echo  12/09/14.The patient states that he has been not taking any medications for the past several months. The patient denies nausea, vomiting, fever, chest pain, palpitations, shortness of breath, orthopnea, PND, dizziness, syncope, cough, congestion, abdominal pain, hematochezia, melena, lower extremity edema. Tele showed multiple short beats of VT.    Past Medical History  Diagnosis Date  . HTN (hypertension)   . HLD (hyperlipidemia) mixed  . Ischemic cardiomyopathy     a.  EF 30-35% 2010;  b.  EF  20-25% by LV gram 08/2012.  [name] CAD (coronary artery disease)     a.  Severe LAD stenosis 2/2 acute thrombus - BMS 2009;  b. 06/01/11 Cath - LAD 20 isr, 14m (3.0x16 Veri-flex BMS), OM1 100p, EF 20-25%;  c. 07/2012 Abnl Cardiolite;  d. 08/2012 Cath/PCI: LM nl, LAD patent stents, D2 80-90 (jailed), LCX 90 p/m (4.0x24 Promus Premier DES), OM1 100, OM2 80-90p (3.0x20 Promus Premier DES), RCA patent mid stent, 40d, EF 25%.  . Anxiety   . Paroxysmal ventricular tachycardia (HCC)     a. VFlutter  CL 210 msec  Rx shock 04/2011  . Systolic CHF, chronic (HCC)   . Automatic implantable cardioverter-defibrillator in situ   . Heart murmur     "as a child" (08/29/2012)  . History of rheumatic fever 1962  . Anginal pain (HCC)     "only w/my MI" (08/29/2012)  . Myocardial infarction [facility]) 1998; 2002; ~ 2010  . Depression      Past Surgical History  Procedure Laterality Date  . Defibrillator implantation  04/12/02    primary prevention for sudden death; Dr. Graciela Husbands   . Cardiac defibrillator placement      primary prevention for sudden death; Dr. Graciela Husbands   . Cardiac catheterization      "lots of those" (08/29/2012)  . Coronary angioplasty with stent placement  2002-08/29/2012    "think I had 4 total then 2 today" to LAD 06/01/2011/notes (08/29/2012)  . Laparoscopic cholecystectomy    . Hernia repair      "w/gallbladder OR" (08/29/2012)  . Percutaneous coronary stent  intervention (pci-s) N/A 06/01/2011    Procedure: PERCUTANEOUS CORONARY STENT INTERVENTION (PCI-S);  Surgeon: Peter M Swaziland, MD;  Location: [facility] CATH LAB;  Service: Cardiovascular;  Laterality: N/A;  . Left heart catheterization with coronary angiogram N/A 06/08/2011    Procedure: LEFT HEART CATHETERIZATION WITH CORONARY ANGIOGRAM;  Surgeon: Dolores Patty, MD;  Location: [facility] CATH LAB;  Service: Cardiovascular;  Laterality: N/A;  . Percutaneous coronary stent intervention (pci-s) N/A 08/29/2012    Procedure: PERCUTANEOUS CORONARY STENT INTERVENTION (PCI-S);  Surgeon:  Peter M Swaziland, MD;  Location: [facility] CATH LAB;  Service: Cardiovascular;  Laterality: N/A;    No Known Allergies  I have reviewed the patient's current medications .  stroke: mapping our early stages of recovery book   Does not apply Once  . aspirin EC  81 mg Oral Daily  . atorvastatin  80 mg Oral q1800  . clopidogrel  75 mg Oral Daily  . enoxaparin (LOVENOX) injection  40 mg Subcutaneous Q24H  . metoprolol      . sertraline  50 mg Oral Daily     acetaminophen, gi cocktail, ondansetron (ZOFRAN) IV, traZODone  Prior to Admission medications   Medication Sig Start Date End Date Taking? Authorizing Provider  nitroGLYCERIN (NITROSTAT) 0.4 MG SL tablet Place 1 tablet (0.4 mg total) under the tongue every 5 (five) minutes as needed. For chest pain 05/05/14  Yes Dwana Melena, PA-C  aspirin EC 81 MG tablet Take 81 mg by mouth daily.    Historical Provider, MD  atorvastatin (LIPITOR) 40 MG tablet Take 1 tablet (40 mg total) by mouth daily at 6 PM. Patient not taking: Reported on 12/08/2014 05/05/14   Dwana Melena, PA-C  carvedilol (COREG) 12.5 MG tablet Take 1 tablet (12.5 mg total) by mouth daily. Take  1 tablets  daily with meals Patient not taking: Reported on 12/08/2014 05/05/14   Dwana Melena, PA-C  clopidogrel (PLAVIX) 75 MG tablet Take 1 tablet (75 mg total) by mouth daily. Take one table by mouth everyday with breakfast. Patient not taking: Reported on 12/08/2014 05/05/14   Dwana Melena, PA-C  lisinopril (PRINIVIL,ZESTRIL) 20 MG tablet Take 1 tablet (20 mg total) by mouth daily. Patient not taking: Reported on 12/08/2014 05/05/14   Dwana Melena, PA-C     Social History   Social History  . Marital Status: Married    Spouse Name: N/A  . Number of Children: N/A  . Years of Education: N/A   Occupational History  . Not on file.   Social History Main Topics  . Smoking status: Current Every Day Smoker -- 0.50 packs/day for 44 years    Types: Cigarettes    Last Attempt to Quit:  05/27/2011  . Smokeless tobacco: Never Used  . Alcohol Use: No     Comment: 08/29/2012 "6-8 beers once/wk". Has not had alcohol in the last 6-8 months  . Drug Use: No  . Sexual Activity: Not Currently   Other Topics Concern  . Not on file   Social History Narrative   Married; full time.     Family Status  Relation Status Death Age  . Mother Alive   . Father Deceased   . Sister Alive   . Brother Alive    Family History  Problem Relation Age of Onset  . Cancer      family hx  . Cancer Father   . Cancer Sister     twin sister and only one has cancer, unknown what  kind  . Hypertension Brother      ROS:  Full 14 point review of systems complete and found to be negative unless listed above.  Physical Exam: Blood pressure 105/57, pulse 81, temperature 98 F (36.7 C), temperature source Oral, resp. rate 19, height 5\' 11"  (1.803 m), weight 200 lb 1.6 oz (90.765 kg), SpO2 96 %.  General: Well developed, well nourished, male in no acute distress Head: Eyes PERRLA, No xanthomas. Normocephalic and atraumatic, oropharynx without edema or exudate.  Lungs: Resp regular and unlabored. Diminished breath sounds on R sided lungs.  Heart: RRR no s3, s4, or murmurs..   Neck: No carotid bruits. No lymphadenopathy.  NO JVD. Abdomen: Bowel sounds present, abdomen soft and non-tender without masses or hernias noted. Msk:  No spine or cva tenderness. No weakness, no joint deformities or effusions. Extremities: No clubbing, cyanosis or edema. DP/PT/Radials 2+ and equal bilaterally. Neuro: Alert and oriented X 3. No focal deficits noted. Psych:  Good affect, responds appropriately Skin: No rashes or lesions noted.  Labs:   Lab Results  Component Value Date   WBC 4.1 12/11/2014   HGB 14.2 12/11/2014   HCT 42.0 12/11/2014   MCV 91.5 12/11/2014   PLT 198 12/11/2014   No results for input(s): INR in the last 72 hours.  Recent Labs Lab 12/11/14 0315  NA 135  K 4.1  CL 100*  CO2 28  BUN  11  CREATININE 1.07  CALCIUM 8.9  PROT 6.3*  BILITOT 0.5  ALKPHOS 34*  ALT 16*  AST 20  GLUCOSE 99  ALBUMIN 3.4*   MAGNESIUM  Date Value Ref Range Status  12/11/2014 2.0 1.7 - 2.4 mg/dL Final    Recent Labs  53/66/4410/16/16 0310 12/09/14 0620 12/09/14 0940  TROPONINI <0.03 <0.03 <0.03    Recent Labs  12/08/14 2234  TROPIPOC 0.02   No results found for: PROBNP Lab Results  Component Value Date   CHOL 183 12/09/2014   HDL 41 12/09/2014   LDLCALC 130* 12/09/2014   TRIG 61 12/09/2014   No results found for: DDIMER LIPASE  Date/Time Value Ref Range Status  09/08/2011 03:28 PM 39 11 - 59 U/L Final   TSH  Date/Time Value Ref Range Status  05/05/2014 04:40 AM 1.158 0.350 - 4.500 uIU/mL Final   No results found for: VITAMINB12, FOLATE, FERRITIN, TIBC, IRON, RETICCTPCT  Echo: 12/09/14  Indications:   TIA 435.9.  ------------------------------------------------------------------- History:  PMH: Ischemic cardiomyopathy. Anxiety. Defibrillator. Coronary artery disease. Congestive heart failure. PMH: Myocardial infarction. Risk factors: Hypertension. Dyslipidemia.  ------------------------------------------------------------------- Study Conclusions  - Left ventricle: Systolic function is severely reduced, estimated EF 15%. The cavity size was moderately dilated. Features are consistent with a pseudonormal left ventricular filling pattern, with concomitant abnormal relaxation and increased filling pressure (grade 2 diastolic dysfunction). Doppler parameters are consistent with high ventricular filling pressure. - Regional wall motion abnormality: Akinesis of the mid-apical anterior, apical inferior, mid anterolateral, apical lateral, and apical myocardium; severe hypokinesis of the mid anteroseptal, mid inferior, and apical septal myocardium; moderate hypokinesis of the basal anteroseptal, basal inferior, and basal-mid inferolateral  myocardium. - Aortic valve: Mildly calcified annulus. Trileaflet. There was mild to moderate regurgitation. - Mitral valve: There was moderate regurgitation. - Left atrium: The atrium was severely dilated. Volume/bsa, ES, (1-plane Simpson&'s, A2C): 48.1 ml/m^2. - Right ventricle: Pacer wire or catheter noted in right ventricle. Systolic function was mildly reduced. - Right atrium: The atrium was mildly dilated.  ECG:   Vent. rate 78  BPM PR interval 210 ms QRS duration 164 ms QT/QTc 436/497 ms P-R-T axes 69 -81 91  Radiology:  No results found.  ASSESSMENT AND PLAN:     Principal Problem:   Aphasia Active Problems:   Essential hypertension   TOBACCO ABUSE   Ischemic cardiomyopathy   Automatic implantable cardioverter-defibrillator in situ   CAD (coronary artery disease)   Generalized anxiety disorder   Major depressive disorder, recurrent episode (HCC)   TIA (transient ischemic attack)   HLD (hyperlipidemia)  Plan:  [name] is a 59 y.o. male with a history of hypertension, hyperlipidemia, history of rheumatic fever, CAD status post multiple stents, ventricular tachycardia, ischemic cardiomyopathy and chronic systolic heart failure s/p Medtronic AICD and current tobacco abuse who was admitted 12/09/14 for TIA vs stroke.    Last cath 08/29/12 showed patent stents in LAD, 80-90% stenosis jailed D2, DES to 90% proximal and mid left circumflex, 100% OM1, 80-90% proximal OM 2 treated with DES, patent mid RCA stent, EF 25%. His LV EF was 25 % on  Last Myoview 08/07/14 --> which reduced to 15% (grade 2DD, Akiness and hypokineses) on most recent echo 12/09/14. Likely due to medication on compliance. He denies any exertional CP or SOB. Recently had normal device function when seen by Dr. Graciela Husbands 12/06/14. Today he has been having multiple small runs of NSVT. Device function normal today.   Continue ASA, Plavix and statin. His BP is soft low. Will add low dose coreg 3.125mg  BID  (hold for SBP less than 90). Follow BP closely. If BP stable, add Lisinopril 2.5mg  tomorrow. Up titrate BB and ACE as outpatient with close follow up. His NSVT likely from low EF. Encouraged smoking cessation and medication compliance. Education given.   SignedManson Passey, PA 12/11/2014, 3:05 PM   Co-Sign MD History reviewed, telemetry reviewed, patient examined. I agree with the assessment and plan as noted above.  He has a history of ischemic cardiomyopathy with previous ejection fraction 25%, now down to 15% by echocardiogram on 12/09/14.  He has a history of multiple stents.  The patient states that he stopped taking all of his heart medicines 2-3 months ago.  He has been very depressed since his wife walked out on him back in March 2016.  He denies any chest pain.  He denies any shortness of breath.  He was in his usual state of health until he had his problem with transient ischemic attack which resolved on its own.  His ICD was checked recently at his routine office visit with Dr. Graciela Husbands on 12/06/14 and there was no evidence of atrial fibrillation.  The device was rechecked today and showed no atrial fibrillation.  Telemetry shows recurrent episodes of short runs of ventricular tachycardia.  These are asymptomatic.  His electrolytes and magnesium are satisfactory.  His blood pressure is soft. I think that his current arrhythmia is secondary to the fact that he has not been taking his carvedilol or other cardiac meds.  We will restart his carvedilol in a low dose because of his soft blood pressure.  Likewise for his ACE inhibitor, we will need to start at a low dose because of his low blood pressure.  This can then be titrated up as an outpatient.  We will continue him on his dual antiplatelet therapy with aspirin and clopidogrel for his ischemic heart disease with multiple stents.  He would not be a good candidate for warfarin because of his noncompliant history.  Despite his very  low  ejection fraction, he is not in congestive heart failure and he appears to be euvolemic and does not require diuretics at this point. I emphasized to him the importance of compliance with his medication going forward.  Also the importance of quitting smoking.

## 2014-12-11 NOTE — Progress Notes (Signed)
Error  [name] 

## 2014-12-11 NOTE — Progress Notes (Signed)
STROKE TEAM PROGRESS NOTE   HISTORY [name] is an 59 y.o. male with a history of cardiomyopathy, ongoing tobacco use, and noncompliance with medications, who reports that this evening while at home he had an episode when he could not get his words out. He was unable to remember his codes for his video games and could not read or write. He was able to contact his daughters who called EMS. Symptoms lasted about an hour and resolved spontaneously. It appears that the patient had some chest pain as well. He is now being admitted for further work up.   Date last known well: Date: 12/08/2014 Time last known well: Time: 19:00 tPA Given: No: Resolution of symptoms    SUBJECTIVE (INTERVAL HISTORY) No family members present. The patient feels back to baseline. He denies recent drug use. He had not been taking his medications as prescribed. He clearly describes episode of expressive aphasia and difficulty with reading understanding written language. He has not been compliant with his medications. Await cardiology opinion   OBJECTIVE Temp:  [97.4 F (36.3 C)-98 F (36.7 C)] 98 F (36.7 C) (10/18 1227) Pulse Rate:  [74-87] 81 (10/18 1227) Cardiac Rhythm:  [-] Normal sinus rhythm (10/18 0745) Resp:  [18-19] 19 (10/18 1227) BP: (96-115)/(50-70) 101/50 mmHg (10/18 1425) SpO2:  [96 %-97 %] 96 % (10/18 1227) Weight:  [200 lb 1.6 oz (90.765 kg)] 200 lb 1.6 oz (90.765 kg) (10/18 0500)  CBC:   Recent Labs Lab 12/08/14 2203 12/11/14 0315  WBC 7.0 4.1  NEUTROABS 5.7  --   HGB 14.3 14.2  HCT 41.9 42.0  MCV 90.7 91.5  PLT 189 198    Basic Metabolic Panel:   Recent Labs Lab 12/08/14 2203 12/11/14 0315  NA 134* 135  K 3.7 4.1  CL 100* 100*  CO2 25 28  GLUCOSE 144* 99  BUN 12 11  CREATININE 1.16 1.07  CALCIUM 8.8* 8.9  MG  --  2.0    Lipid Panel:     Component Value Date/Time   CHOL 183 12/09/2014 0310   TRIG 61 12/09/2014 0310   HDL 41 12/09/2014 0310   CHOLHDL 4.5  12/09/2014 0310   VLDL 12 12/09/2014 0310   LDLCALC 130* 12/09/2014 0310   HgbA1c:  Lab Results  Component Value Date   HGBA1C 5.5 12/09/2014   Urine Drug Screen:     Component Value Date/Time   LABOPIA NONE DETECTED 12/09/2014 0315   COCAINSCRNUR NONE DETECTED 12/09/2014 0315   LABBENZ NONE DETECTED 12/09/2014 0315   AMPHETMU POSITIVE* 12/09/2014 0315   THCU NONE DETECTED 12/09/2014 0315   LABBARB NONE DETECTED 12/09/2014 0315      IMAGING  Ct Angio Head and Neck W/cm &/or Wo Cm 12/09/2014   Mild atherosclerotic disease in the carotid bifurcation bilaterally. No significant carotid or vertebral artery stenosis in the neck. Mild atherosclerotic disease in the cavernous carotid bilaterally without significant stenosis. No significant intracranial stenosis or aneurysm. No acute intracranial abnormality.   Dg Chest 2 View 12/08/2014   No active cardiopulmonary disease.   Ct Head Wo Contrast 12/08/2014   No acute intracranial pathology.   2D echo -  - Left ventricle: Systolic function is severely reduced, estimated EF 15%. The cavity size was moderately dilated. Features are consistent with a pseudonormal left ventricular filling pattern, with concomitant abnormal relaxation and increased filling pressure (grade 2 diastolic dysfunction). Doppler parameters are consistent with high ventricular filling pressure. - Regional wall motion abnormality: Akinesis of  the mid-apical anterior, apical inferior, mid anterolateral, apical lateral, and apical myocardium; severe hypokinesis of the mid anteroseptal, mid inferior, and apical septal myocardium; moderate hypokinesis of the basal anteroseptal, basal inferior, and basal-mid inferolateral myocardium. - Aortic valve: Mildly calcified annulus. Trileaflet. There was mild to moderate regurgitation. - Mitral valve: There was moderate regurgitation. - Left atrium: The atrium was severely dilated. Volume/bsa,  ES, (1-plane Simpson&'s, A2C): 48.1 ml/m^2. - Right ventricle: Pacer wire or catheter noted in right ventricle. Systolic function was mildly reduced. - Right atrium: The atrium was mildly dilated.   PHYSICAL EXAM  Temp:  [97.4 F (36.3 C)-98 F (36.7 C)] 98 F (36.7 C) (10/18 1227) Pulse Rate:  [74-87] 81 (10/18 1227) Resp:  [18-19] 19 (10/18 1227) BP: (96-115)/(50-70) 101/50 mmHg (10/18 1425) SpO2:  [96 %-97 %] 96 % (10/18 1227) Weight:  [200 lb 1.6 oz (90.765 kg)] 200 lb 1.6 oz (90.765 kg) (10/18 0500)  General - Well nourished, well developed, in no apparent distress.  Ophthalmologic - Sharp disc margins OU.   Cardiovascular - Regular rate and rhythm.  Mental Status -  Level of arousal and orientation to time, place, and person were intact. Language including expression, naming, repetition, comprehension was assessed and found intact. Fund of Knowledge was assessed and was intact.  Cranial Nerves II - XII - II - Visual field intact OU. III, IV, VI - Extraocular movements intact. V - Facial sensation intact bilaterally. VII - Facial movement intact bilaterally. VIII - Hearing & vestibular intact bilaterally. X - Palate elevates symmetrically. XI - Chin turning & shoulder shrug intact bilaterally. XII - Tongue protrusion intact.  Motor Strength - The patient's strength was normal in all extremities and pronator drift was absent.  Bulk was normal and fasciculations were absent.   Motor Tone - Muscle tone was assessed at the neck and appendages and was normal.  Reflexes - The patient's reflexes were 1+ in all extremities and he had no pathological reflexes.  Sensory - Light touch, temperature/pinprick were assessed and were symmetrical.    Coordination - The patient had normal movements in the hands and feet with no ataxia or dysmetria.  Tremor was absent  Gait and Station - The patient's transfers, posture, gait, station, and turns were observed as  normal.   ASSESSMENT/PLAN [name] is a 59 y.o. male with history of hypertension, noncompliant with medications, ongoing tobacco use, history of substance abuse, hyperlipidemia, ischemic cardiomyopathy, coronary artery disease, paroxysmal ventricular tachycardia, congestive heart failure s/p AICD, and previous MI presenting with speech difficulties. He did not receive IV t-PA due to resolution of deficits.  Suspected left brain TIA vs. Stroke - likely embolic due to severe cardiomyopathy.  Resultant  resolution of deficits  MRI / MRA not performed due to AICD  CTA head and neck - mild asthero  2D Echo  EF 15%  EEG -  normal awake EEG.  Recommend AICD interrogation to rule out afib.  LDL 130  HgbA1c  5.5  VTE prophylaxis - Lovenox Diet Heart Room service appropriate?: Yes; Fluid consistency:: Thin  aspirin 81 mg orally every day and clopidogrel 75 mg orally every day prior to admission but noncompliance, now on aspirin 81 mg orally every day and clopidogrel 75 mg orally every day. Due to severe cardiomyopathy likely the cause of stroke, we recommend anticoagulation with coumadin.  Patient counseled to be compliant with his antithrombotic medications  Ongoing aggressive stroke risk factor management  Therapy recommendations: Pending  Disposition:  Pending  Severe cardiomyopathy  EF 20-25% in the past  Did not follow up with cardiology regularly  Recommend cardiology consult  Recommend AICD interrogation to rule out afib.  Due to severe cardiomyopathy likely the cause of stroke, we recommend anticoagulation with coumadin.  Repeat 2D echo in 2-3 months after treatment, if EF >= 30%, pt can be off coumadin.  Hypertension  Blood pressure tends to run low - ischemic cardiomyopathy - currently off all antihypertensive medications.  Permissive hypertension (OK if < 220/120) but gradually normalize in 5-7 days  Hyperlipidemia  Home meds:  Atorvastatin 40 mg  daily resumed in the hospital  LDL 130, goal < 70  Increase Lipitor to 80 mg daily  Continue statin at discharge  Tobacco abuse  Current smoker  Smoking cessation counseling provided  Pt is willing to quit  Other Stroke Risk Factors  Advanced age  Coronary artery disease s/p stent  S/p AICD  Other Active Problems  UDS - positive for amphetamines  Hospital day # 1 The patient is not reliable long-term anticoagulation candidate given history of noncompliance and unless the echocardiogram shows a definite clot or cardiologist strongly feels anticoagulation is necessary  We  recommend aspirin. Stroke team will sign off. Currently call for questions. Delia Heady, MD Stroke Neurology 12/11/2014 5:07 PM     To contact Stroke Continuity provider, please refer to [email or web address]. After hours, contact General Neurology

## 2014-12-12 DIAGNOSIS — Z95810 Presence of automatic (implantable) cardiac defibrillator: Secondary | ICD-10-CM

## 2014-12-12 DIAGNOSIS — I10 Essential (primary) hypertension: Secondary | ICD-10-CM

## 2014-12-12 DIAGNOSIS — I5022 Chronic systolic (congestive) heart failure: Secondary | ICD-10-CM | POA: Diagnosis present

## 2014-12-12 DIAGNOSIS — G459 Transient cerebral ischemic attack, unspecified: Secondary | ICD-10-CM

## 2014-12-12 DIAGNOSIS — I255 Ischemic cardiomyopathy: Secondary | ICD-10-CM

## 2014-12-12 DIAGNOSIS — F17200 Nicotine dependence, unspecified, uncomplicated: Secondary | ICD-10-CM

## 2014-12-12 DIAGNOSIS — I472 Ventricular tachycardia: Secondary | ICD-10-CM

## 2014-12-12 HISTORY — DX: Chronic systolic (congestive) heart failure: I50.22

## 2014-12-12 MED ORDER — TRAZODONE HCL 50 MG PO TABS: 50.0000 mg | ORAL_TABLET | Freq: Every evening | ORAL | Status: DC | PRN

## 2014-12-12 MED ORDER — CARVEDILOL 3.125 MG PO TABS: 3.1250 mg | ORAL_TABLET | Freq: Two times a day (BID) | ORAL | Status: DC

## 2014-12-12 MED ORDER — ATORVASTATIN CALCIUM 40 MG PO TABS: 40.0000 mg | ORAL_TABLET | Freq: Every day | ORAL | Status: DC

## 2014-12-12 MED ORDER — SERTRALINE HCL 50 MG PO TABS: 50.0000 mg | ORAL_TABLET | Freq: Every day | ORAL | Status: DC

## 2014-12-12 MED ORDER — CLOPIDOGREL BISULFATE 75 MG PO TABS: 75.0000 mg | ORAL_TABLET | Freq: Every day | ORAL | Status: DC

## 2014-12-12 NOTE — Discharge Summary (Signed)
Physician Discharge Summary  [name] LuriaLewis R Natter WUJ:[id number] DOB: 01/10/1956 DOA: 12/08/2014  PCP: Pcp Not In System  Admit date: 12/08/2014 Discharge date: 12/12/2014  Time spent: > 30 minutes  Recommendations for Outpatient Follow-up:  1. Follow up with Ronie Spiesayna Dunn PA-C with Cardiology in 2 weeks as scheduled  Discharge Diagnoses:  Principal Problem:   Aphasia Active Problems:   Essential hypertension   VENTRICULAR TACHYCARDIA   TOBACCO ABUSE   Ischemic cardiomyopathy   Automatic implantable cardioverter-defibrillator in situ   CAD (coronary artery disease)   Generalized anxiety disorder   Major depressive disorder, recurrent episode (HCC)   TIA (transient ischemic attack)   HLD (hyperlipidemia)   Chronic systolic congestive heart failure [facility](HCC)  Discharge Condition: stable  Diet recommendation: heart healthy  Filed Weights   12/10/14 0540 12/11/14 0500 12/12/14 0504  Weight: 90.855 kg (200 lb 4.8 oz) 90.765 kg (200 lb 1.6 oz) 91.672 kg (202 lb 1.6 oz)   History of present illness:  [name] is a 59 y.o. male with a past medical history significant for ischemic CM with ICD and last EF 33% in 2014, depression with passive suicidality, active smoking, HTN who presents with transient aphasia. History is collected from the patient and his daughters both. The patient was in his normal state of health until this evening after supper when he was about to play video games. He was alone. He reports that he became confused, could "see but couldn't see", and couldn't spell the letters in the password on his computer. He called his daughters kept repeating "I can't remember my password" no matter what they asked. By the time the daughters arrived, EMS were already present in the patient was complaining of chest discomfort. He was given nitroglycerin and transported to the ED In the ED, the patient was tachycardic, had some chest discomfort, but his screening laboratory studies and  initial troponin were all negative. 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 They also report that he has made threats to his estranged wife of committing suicide with nitroglycerin recently.  Hospital Course:  TIA - neurology consulted and have followed patient while hospitalized. Patient could not undergo an MRi due to pacemaker. He has been non compliant with all of his home medications. He is to be continue on Aspirin and plavix. Neurology did recommend anticoagulation with Coumadin, however given poor compliance, after discussing with cardiology he is not a good candidate for Coumadin. His symptoms have completely resolved and patient was back to baseline. Tobacco abuse - counseled for cessation Chronic systolic heart failure with NSVT - patient underwent a 2D echo which showed depressed EF 15%. Cardiology was consulted and have followed patient while hospitalized. He was restarted on Coreg which he tolerated well. Given borderline low BP he was not restarted on ACEI which will need to be addressed as an outpatient.  Paroxsymal A fib - s/p Medtronic PPM. Cardiology consult as above. HTN - Coreg, hold ACEI as above HLD - continue statin Situational depression - psychiatry consulted as patient very depressed. Prescribe sertraline 50 daily, trazodone 50  Procedures:  2D echo Study Conclusions - Left ventricle: Systolic function is severely reduced, estimatedEF 15%. The cavity size was moderately dilated. Features areconsistent with a pseudonormal left ventricular filling pattern,with concomitant abnormal relaxation and increased fillingpressure (grade 2  diastolic dysfunction). Doppler parameters areconsistent with high ventricular filling pressure. - Regional wall motion abnormality: Akinesis of the  mid-apicalanterior, apical inferior, mid anterolateral, apical lateral, and apical myocardium; severe hypokinesis of the mid anteroseptal,mid inferior, and apical septal myocardium; moderate hypokinesisof the basal anteroseptal, basal inferior, and basal-midinferolateral myocardium. - Aortic valve: Mildly calcified annulus. Trileaflet. There wasmild to moderate regurgitation. - Mitral valve: There was moderate regurgitation. - Left atrium: The atrium was severely dilated. Volume/bsa, ES,(1-plane Simpson&'s, A2C): 48.1 ml/m^2. - Right ventricle: Pacer wire or catheter noted in right ventricle.Systolic function was mildly reduced. - Right atrium: The atrium was mildly dilated.   Consultations:  Cardiology   Neurology   Discharge Exam: Filed Vitals:   12/11/14 1711 12/11/14 2138 12/12/14 0504 12/12/14 0846  BP: 107/66 102/53 105/63 105/63  Pulse: 75  84 77  Temp:  98.1 F (36.7 C) 97.7 F (36.5 C)   TempSrc:  Oral Oral   Resp:  18 20   Height:      Weight:   91.672 kg (202 lb 1.6 oz)   SpO2:  97% 98%    General: NAD Cardiovascular: RRR Respiratory: CTA biL  Discharge Instructions  Discharge Instructions    Ambulatory referral to Neurology    Complete by:  As directed   Please schedule post stroke follow up in 2 months.            Medication List    STOP taking these medications        lisinopril 20 MG tablet  Commonly known as:  PRINIVIL,ZESTRIL      TAKE these medications        aspirin EC 81 MG tablet  Take 81 mg by mouth daily.     atorvastatin 40 MG tablet  Commonly known as:  LIPITOR  Take 1 tablet (40 mg total) by mouth daily at 6 PM.     carvedilol 3.125 MG tablet  Commonly known as:  COREG  Take 1 tablet (3.125 mg total) by mouth 2 (two) times daily with a meal.     clopidogrel 75 MG tablet  Commonly known as:  PLAVIX  Take 1 tablet (75 mg total) by mouth daily. Take one table by mouth everyday with breakfast.     nitroGLYCERIN 0.4 MG  SL tablet  Commonly known as:  NITROSTAT  Place 1 tablet (0.4 mg total) under the tongue every 5 (five) minutes as needed. For chest pain     sertraline 50 MG tablet  Commonly known as:  ZOLOFT  Take 1 tablet (50 mg total) by mouth daily.     traZODone 50 MG tablet  Commonly known as:  DESYREL  Take 1 tablet (50 mg total) by mouth at bedtime as needed for sleep.           Follow-up Information    Follow up with SETHI,PRAMOD, MD In 2 months.   Specialties:  Neurology, Radiology   Why:  Stroke Clinic, Office will call you with appointment date & time   Contact information:   [address] [place] [address] [phone]       Follow up with Ronie Spies, PA-C On 12/26/2014.   Specialties:  Cardiology, Radiology   Why:  Cardiology Hospital Follow-up on 12/26/2014 at 11:00 AM.   Contact information:   [address] [place] [address] [phone]       The results of significant diagnostics from this hospitalization (including imaging, microbiology, ancillary and laboratory) are listed  below for reference.    Significant Diagnostic Studies: Ct Angio Head W/cm &/or Wo Cm  12/09/2014  CLINICAL DATA:  TIA.  Dizziness and confusion EXAM: CT ANGIOGRAPHY HEAD AND NECK TECHNIQUE: Multidetector CT imaging of the head and neck was performed using the standard protocol during bolus administration of intravenous contrast. Multiplanar CT image reconstructions and MIPs were obtained to evaluate the vascular anatomy. Carotid stenosis measurements (when applicable) are obtained utilizing NASCET criteria, using the distal internal carotid diameter as the denominator. CONTRAST:  50mL OMNIPAQUE IOHEXOL 350 MG/ML SOLN COMPARISON:  CT head 12/08/2014 FINDINGS: CT HEAD Brain: Unenhanced head CT not repeated. See results from yesterday. No acute intracranial abnormality. Calvarium and skull base: Negative Paranasal sinuses: Mild mucosal thickening in the ethmoid  sinuses. Orbits: Negative CTA NECK Aortic arch: The top of the aortic arch is imaged with mild atherosclerotic calcification. No dissection or aneurysm. Proximal great vessels patent. Left sided pacemaker wires noted in the subclavian vein. Right carotid system: Right common carotid artery normal. Mild atherosclerotic calcification at the carotid bifurcation. No significant stenosis or dissection. Left carotid system: Left common carotid artery widely patent and normal. Mild atherosclerotic calcification of the carotid bifurcation without significant stenosis or dissection Vertebral arteries:Both vertebral arteries equal in size. Mild calcification at the origin of the right vertebral artery. No other significant vertebral artery stenosis. Skeleton: Mild to moderate disc degeneration and spondylosis C6-7. No acute skeletal abnormality. Other neck: Negative for mass or adenopathy. CTA HEAD Anterior circulation: Mild atherosclerotic calcification of the cavernous carotid bilaterally without significant stenosis. Anterior and middle cerebral arteries widely patent without stenosis occlusion or aneurysm. Posterior circulation: Both vertebral arteries are patent to the basilar without stenosis. Left PICA patent. Left PICA not visualized. Left AICA patent. Basilar widely patent. Superior cerebellar and posterior cerebral arteries patent bilaterally. Negative for aneurysm in the posterior circulation. Venous sinuses: Patent Anatomic variants: Negative Delayed phase: Normal enhancement on delayed imaging no mass lesion. IMPRESSION: Mild atherosclerotic disease in the carotid bifurcation bilaterally. No significant carotid or vertebral artery stenosis in the neck. Mild atherosclerotic disease in the cavernous carotid bilaterally without significant stenosis. No significant intracranial stenosis or aneurysm. No acute intracranial abnormality. Electronically Signed   By: Marlan Palau M.D.   On: 12/09/2014 08:33   Dg Chest  2 View  12/08/2014  CLINICAL DATA:  Acute onset chest pain today. Coronary artery disease. Previous myocardial infarction and congestive heart failure. EXAM: CHEST  2 VIEW COMPARISON:  06/13/2014 FINDINGS: Heart size is within normal limits. Both lungs are clear. No evidence of pleural effusion or pneumothorax. Single lead AICD remains in appropriate position. IMPRESSION: No active cardiopulmonary disease. Electronically Signed   By: Myles Rosenthal M.D.   On: 12/08/2014 22:56   Ct Head Wo Contrast  12/08/2014  CLINICAL DATA:  Confusion, speech difficulty EXAM: CT HEAD WITHOUT CONTRAST TECHNIQUE: Contiguous axial images were obtained from the base of the skull through the vertex without intravenous contrast. COMPARISON:  09/01/2006 FINDINGS: There is no evidence of mass effect, midline shift or extra-axial fluid collections. There is no evidence of a space-occupying lesion or intracranial hemorrhage. There is no evidence of a cortical-based area of acute infarction. The ventricles and sulci are appropriate for the patient's age. The basal cisterns are patent. Visualized portions of the orbits are unremarkable. The visualized portions of the paranasal sinuses and mastoid air cells are unremarkable. The osseous structures are unremarkable. IMPRESSION: No acute intracranial pathology. Electronically Signed   By: Elige Ko  On: 12/08/2014 23:04   Ct Angio Neck W/cm &/or Wo/cm  12/09/2014  CLINICAL DATA:  TIA.  Dizziness and confusion EXAM: CT ANGIOGRAPHY HEAD AND NECK TECHNIQUE: Multidetector CT imaging of the head and neck was performed using the standard protocol during bolus administration of intravenous contrast. Multiplanar CT image reconstructions and MIPs were obtained to evaluate the vascular anatomy. Carotid stenosis measurements (when applicable) are obtained utilizing NASCET criteria, using the distal internal carotid diameter as the denominator. CONTRAST:  50mL OMNIPAQUE IOHEXOL 350 MG/ML SOLN  COMPARISON:  CT head 12/08/2014 FINDINGS: CT HEAD Brain: Unenhanced head CT not repeated. See results from yesterday. No acute intracranial abnormality. Calvarium and skull base: Negative Paranasal sinuses: Mild mucosal thickening in the ethmoid sinuses. Orbits: Negative CTA NECK Aortic arch: The top of the aortic arch is imaged with mild atherosclerotic calcification. No dissection or aneurysm. Proximal great vessels patent. Left sided pacemaker wires noted in the subclavian vein. Right carotid system: Right common carotid artery normal. Mild atherosclerotic calcification at the carotid bifurcation. No significant stenosis or dissection. Left carotid system: Left common carotid artery widely patent and normal. Mild atherosclerotic calcification of the carotid bifurcation without significant stenosis or dissection Vertebral arteries:Both vertebral arteries equal in size. Mild calcification at the origin of the right vertebral artery. No other significant vertebral artery stenosis. Skeleton: Mild to moderate disc degeneration and spondylosis C6-7. No acute skeletal abnormality. Other neck: Negative for mass or adenopathy. CTA HEAD Anterior circulation: Mild atherosclerotic calcification of the cavernous carotid bilaterally without significant stenosis. Anterior and middle cerebral arteries widely patent without stenosis occlusion or aneurysm. Posterior circulation: Both vertebral arteries are patent to the basilar without stenosis. Left PICA patent. Left PICA not visualized. Left AICA patent. Basilar widely patent. Superior cerebellar and posterior cerebral arteries patent bilaterally. Negative for aneurysm in the posterior circulation. Venous sinuses: Patent Anatomic variants: Negative Delayed phase: Normal enhancement on delayed imaging no mass lesion. IMPRESSION: Mild atherosclerotic disease in the carotid bifurcation bilaterally. No significant carotid or vertebral artery stenosis in the neck. Mild  atherosclerotic disease in the cavernous carotid bilaterally without significant stenosis. No significant intracranial stenosis or aneurysm. No acute intracranial abnormality. Electronically Signed   By: Marlan Palau M.D.   On: 12/09/2014 08:33    Microbiology: No results found for this or any previous visit (from the past 240 hour(s)).   Labs: Basic Metabolic Panel:  Recent Labs Lab 12/08/14 2203 12/11/14 0315  NA 134* 135  K 3.7 4.1  CL 100* 100*  CO2 25 28  GLUCOSE 144* 99  BUN 12 11  CREATININE 1.16 1.07  CALCIUM 8.8* 8.9  MG  --  2.0   Liver Function Tests:  Recent Labs Lab 12/11/14 0315  AST 20  ALT 16*  ALKPHOS 34*  BILITOT 0.5  PROT 6.3*  ALBUMIN 3.4*   No results for input(s): LIPASE, AMYLASE in the last 168 hours. No results for input(s): AMMONIA in the last 168 hours. CBC:  Recent Labs Lab 12/08/14 2203 12/11/14 0315  WBC 7.0 4.1  NEUTROABS 5.7  --   HGB 14.3 14.2  HCT 41.9 42.0  MCV 90.7 91.5  PLT 189 198   Cardiac Enzymes:  Recent Labs Lab 12/09/14 0310 12/09/14 0620 12/09/14 0940  TROPONINI <0.03 <0.03 <0.03   BNP: BNP (last 3 results) No results for input(s): BNP in the last 8760 hours.  ProBNP (last 3 results) No results for input(s): PROBNP in the last 8760 hours.  CBG: No results for input(s): GLUCAP  in the last 168 hours.     SignedPamella Pert  [facility] 12/12/2014, 2:42 PM

## 2014-12-12 NOTE — Discharge Instructions (Signed)
Follow with Pcp Not In System in 5-7 days ° °Please get a complete blood count and chemistry panel checked by your Primary MD at your next visit, and again as instructed by your Primary MD. Please get your medications reviewed and adjusted by your Primary MD. ° °Please request your Primary MD to go over all Hospital Tests and Procedure/Radiological results at the follow up, please get all Hospital records sent to your Prim MD by signing hospital release before you go home. ° °If you had Pneumonia of Lung problems at the Hospital: °Please get a 2 view Chest X ray done in 6-8 weeks after hospital discharge or sooner if instructed by your Primary MD. ° °If you have Congestive Heart Failure: °Please call your Cardiologist or Primary MD anytime you have any of the following symptoms:  °1) 3 pound weight gain in 24 hours or 5 pounds in 1 week  °2) shortness of breath, with or without a dry hacking cough  °3) swelling in the hands, feet or stomach  °4) if you have to sleep on extra pillows at night in order to breathe ° °Follow cardiac low salt diet and 1.5 lit/day fluid restriction. ° °If you have diabetes °Accuchecks 4 times/day, Once in AM empty stomach and then before each meal. °Log in all results and show them to your primary doctor at your next visit. °If any glucose reading is under 80 or above 300 call your primary MD immediately. ° °If you have Seizure/Convulsions/Epilepsy: °Please do not drive, operate heavy machinery, participate in activities at heights or participate in high speed sports until you have seen by Primary MD or a Neurologist and advised to do so again. ° °If you had Gastrointestinal Bleeding: °Please ask your Primary MD to check a complete blood count within one week of discharge or at your next visit. Your endoscopic/colonoscopic biopsies that are pending at the time of discharge, will also need to followed by your Primary MD. ° °Get Medicines reviewed and adjusted. °Please take all your  medications with you for your next visit with your Primary MD ° °Please request your Primary MD to go over all hospital tests and procedure/radiological results at the follow up, please ask your Primary MD to get all Hospital records sent to his/her office. ° °If you experience worsening of your admission symptoms, develop shortness of breath, life threatening emergency, suicidal or homicidal thoughts you must seek medical attention immediately by calling 911 or calling your MD immediately  if symptoms less severe. ° °You must read complete instructions/literature along with all the possible adverse reactions/side effects for all the Medicines you take and that have been prescribed to you. Take any new Medicines after you have completely understood and accpet all the possible adverse reactions/side effects.  ° °Do not drive or operate heavy machinery when taking Pain medications.  ° °Do not take more than prescribed Pain, Sleep and Anxiety Medications ° °Special Instructions: If you have smoked or chewed Tobacco  in the last 2 yrs please stop smoking, stop any regular Alcohol  and or any Recreational drug use. ° °Wear Seat belts while driving. ° °Please note °You were cared for by a hospitalist during your hospital stay. If you have any questions about your discharge medications or the care you received while you were in the hospital after you are discharged, you can call the unit and asked to speak with the hospitalist on call if the hospitalist that took care of you is not available.   Once you are discharged, your primary care physician will handle any further medical issues. Please note that NO REFILLS for any discharge medications will be authorized once you are discharged, as it is imperative that you return to your primary care physician (or establish a relationship with a primary care physician if you do not have one) for your aftercare needs so that they can reassess your need for medications and monitor your  lab values. ° °You can reach the hospitalist office at phone [phone] or fax [phone] °  °If you do not have a primary care physician, you can call [phone] for a physician referral. ° °Activity: As tolerated with Full fall precautions use walker/cane & assistance as needed ° °Diet: heart healthy ° °Disposition Home ° ° °

## 2014-12-12 NOTE — Progress Notes (Signed)
Patient Name: [name] Date of Encounter: 12/12/2014  Principal Problem:   Aphasia Active Problems:   Essential hypertension   VENTRICULAR TACHYCARDIA   TOBACCO ABUSE   Ischemic cardiomyopathy   Automatic implantable cardioverter-defibrillator in situ   CAD (coronary artery disease)   Generalized anxiety disorder   Major depressive disorder, recurrent episode (HCC)   TIA (transient ischemic attack)   HLD (hyperlipidemia)   Primary Cardiologist: Dr. Jens Som Patient Profile: 59 y.o. male with PMH of HTN, HLD, history of rheumatic fever, CAD (s/p multiple stents), ventricular tachycardia, ischemic cardiomyopathy (s/p AICD) and chronic systolic heart failure admitted 12/09/14 for speech difficulties. Reported not taking medications for the past few months prior to admission.  SUBJECTIVE: Denies any chest pain, palpitations, or shortness of breath. States he is ready to go home.  OBJECTIVE Filed Vitals:   12/11/14 1425 12/11/14 1711 12/11/14 2138 12/12/14 0504  BP: 101/50 107/66 102/53 105/63  Pulse:  75  84  Temp:   98.1 F (36.7 C) 97.7 F (36.5 C)  TempSrc:   Oral Oral  Resp:   18 20  Height:      Weight:    202 lb 1.6 oz (91.672 kg)  SpO2:   97% 98%    Intake/Output Summary (Last 24 hours) at 12/12/14 0741 Last data filed at 12/11/14 0755  Gross per 24 hour  Intake    120 ml  Output      0 ml  Net    120 ml   Filed Weights   12/10/14 0540 12/11/14 0500 12/12/14 0504  Weight: 200 lb 4.8 oz (90.855 kg) 200 lb 1.6 oz (90.765 kg) 202 lb 1.6 oz (91.672 kg)    PHYSICAL EXAM General: Well developed, well nourished, male in no acute distress. Head: Normocephalic, atraumatic.  Neck: Supple without bruits, JVD not elevated. Lungs:  Resp regular and unlabored, CTA without wheezing or rales. Heart: RRR, S1, S2, no S3, S4, or murmur; no rub. Abdomen: Soft, non-tender, non-distended with normoactive bowel sounds. No hepatomegaly. No rebound/guarding. No obvious  abdominal masses. Extremities: No clubbing, cyanosis, or edema. Distal pedal pulses are 2+ bilaterally. Neuro: Alert and oriented X 3. Moves all extremities spontaneously. Psych: Normal affect.  LABS: CBC: Recent Labs  12/11/14 0315  WBC 4.1  HGB 14.2  HCT 42.0  MCV 91.5  PLT 198   Basic Metabolic Panel: Recent Labs  12/11/14 0315  NA 135  K 4.1  CL 100*  CO2 28  GLUCOSE 99  BUN 11  CREATININE 1.07  CALCIUM 8.9  MG 2.0   Liver Function Tests: Recent Labs  12/11/14 0315  AST 20  ALT 16*  ALKPHOS 34*  BILITOT 0.5  PROT 6.3*  ALBUMIN 3.4*   Cardiac Enzymes: Recent Labs  12/09/14 0940  TROPONINI <0.03   Thyroid Function Tests: Recent Labs  12/11/14 1438  TSH 1.739   TELE:  Ventricular rhythm, rate in 60's -80's.    ECHO: 12/09/2014 Study Conclusions - Left ventricle: Systolic function is severely reduced, estimated EF 15%. The cavity size was moderately dilated. Features are consistent with a pseudonormal left ventricular filling pattern, with concomitant abnormal relaxation and increased filling pressure (grade 2 diastolic dysfunction). Doppler parameters are consistent with high ventricular filling pressure. - Regional wall motion abnormality: Akinesis of the mid-apical anterior, apical inferior, mid anterolateral, apical lateral, and apical myocardium; severe hypokinesis of the mid anteroseptal, mid inferior, and apical septal myocardium; moderate hypokinesis of the basal anteroseptal, basal inferior, and  basal-mid inferolateral myocardium. - Aortic valve: Mildly calcified annulus. Trileaflet. There was mild to moderate regurgitation. - Mitral valve: There was moderate regurgitation. - Left atrium: The atrium was severely dilated. Volume/bsa, ES, (1-plane Simpson&'s, A2C): 48.1 ml/m^2. - Right ventricle: Pacer wire or catheter noted in right ventricle. Systolic function was mildly reduced. - Right atrium: The atrium  was mildly dilated.  Current Medications:  .  stroke: mapping our early stages of recovery book   Does not apply Once  . aspirin EC  81 mg Oral Daily  . atorvastatin  80 mg Oral q1800  . carvedilol  3.125 mg Oral BID WC  . clopidogrel  75 mg Oral Daily  . enoxaparin (LOVENOX) injection  40 mg Subcutaneous Q24H  . sertraline  50 mg Oral Daily      ASSESSMENT AND PLAN:  1. Ischemic Cardiomyopathy - Last cath 08/29/12 showed patent stents in LAD, 80-90% stenosis jailed D2, DES to 90% proximal and mid left circumflex, 100% OM1, 80-90% proximal OM 2 treated with DES, patent mid RCA stent, EF 25% at that time. - echo on 12/09/2014 showed EF of 15%, grade 2 diastolic dysfunction, Akinesis of the mid-apical anterior, apical inferior, mid anterolateral, apical lateral, and apical myocardium; severe hypokinesis of the mid anteroseptal, mid inferior, and apical septal myocardium; moderate hypokinesis of the basal anteroseptal, basal inferior, and basal-mid inferolateral myocardium. - recent device check by Dr. Graciela HusbandsKlein in office on 12/06/2014 showing no evidence of atrial fibrillation. Rechecked and still without evidence of atrial fibrillation. - continue ASA, statin, Plavix, and BB. Low-dose Coreg 3.125mg  BID recently added back on 12/11/2014. - with BP's still being on the low-side would hold off on restarting low-dose ACE-I today. Can be restarted as outpatient if BP allows at that time.  2. HTN - BP has been 96/50 - 109/66 in the past 24 hours - Coreg 3.125mg  BID recently added back.  3. HLD - continue statin therapy  Hospital Follow-up arranged on 12/26/2014 at 11:00 with Ronie Spiesayna Dunn, PA-C.   Lorri FrederickSigned, Brittany M Strader , PA-C 7:41 AM 12/12/2014 Pager: [phone] Agree with assessment and plan above.  Telemetry shows improvement in frequency of his PVCs since restarting carvedilol.  Medications can be up titrated further as outpatient.  On exam today his lungs are clear and the heart  reveals no gallop and there is no peripheral edema.  Patient is anxious for discharge.  The importance of compliance with his medicines going forward was stressed.  His daughter was in the room and will help to encourage him to take his medicines at home.

## 2014-12-12 NOTE — Progress Notes (Signed)
Patient being discharged home per MD order. All discharge instruction given to both patient and patients daughter, Verbalized understanding..[name]

## 2014-12-17 ENCOUNTER — Encounter: Payer: Self-pay | Admitting: Internal Medicine

## 2014-12-25 ENCOUNTER — Encounter: Payer: Self-pay | Admitting: Physician Assistant

## 2014-12-25 DIAGNOSIS — I493 Ventricular premature depolarization: Secondary | ICD-10-CM | POA: Insufficient documentation

## 2014-12-25 NOTE — Progress Notes (Signed)
Cardiology Office Note Date:  12/26/2014  Patient ID:  [name], DOB [birth year], MRN [id number] PCP:  Pcp Not In System  Primary Cardiologist: Dr. Jens Som Primary Electrophysiologist: Dr. Graciela Husbands   Chief Complaint: f/u TIA, low EF  History of Present Illness: [name] is a 59 y.o. male with history of hypertension, hyperlipidemia, history of rheumatic fever, CAD s/p multiple stents as below, paroxysmal ventricular tachycardia, ischemic cardiomyopathy, chronic systolic heart failure s/p Medtronic AICD, depression, recent TIA who presents for post-hospital follow-up. He was admitted 11/2014 with aphasia and confusion and felt to have TIA. He could not undergo MRI due to pacemaker. He had been noncompliant with all of his home medications prior to admission. He was continued on ASA and Plavix. Neurology recommended anticoagulation with Coumadin but internal medicine discussed with the cardiology team who felt he was not a good candidate given his poor compliance. Device did not show any evidence of atrial fibrillation. 2D Echo 12/09/14: EF 15%, grade 2 DD, high ventricular filling pressures, multiple WMA, mild-mod AI, mod MR, mildly dilated RA, severely dilated LA. He did have PVCs on telemetry. Beta blocker was re-added. ACEI was not added due to borderline BP. Labs otherwise showed normal TSH, Cr 1.07, troponin neg, LDL 130.  He comes in for follow-up today overall doing well. He has not had any recurrent neurologic problems. He has not had any chest pain, palpitations or syncope. He has chronic DOE which he feels is unchanged. He continues to smoke - at one point he had really cut back but after he separated from his wife he developed significant depression (including requiring psychiatric consult) he started back up.   Past Medical History  Diagnosis Date  . HTN (hypertension)   . HLD (hyperlipidemia) mixed  . Ischemic cardiomyopathy     a.  EF 30-35% 2010;  b.  EF 20-25% by LV gram  08/2012. c. EF 15% by echo 11/2014.  [name] CAD (coronary artery disease)     a.  Severe LAD stenosis 2/2 acute thrombus - BMS 2009;  b. 06/01/11 Cath - LAD 20 isr, 65m (3.0x16 Veri-flex BMS), OM1 100p, EF 20-25%;  c. 07/2012 Abnl Cardiolite;  d. 08/2012 Cath/PCI: LM nl, LAD patent stents, D2 80-90 (jailed), LCX 90 p/m (4.0x24 Promus Premier DES), OM1 100, OM2 80-90p (3.0x20 Promus Premier DES), RCA patent mid stent, 40d, EF 25%.  . Anxiety   . Paroxysmal ventricular tachycardia (HCC)     a. VFlutter  CL 210 msec  Rx shock 04/2011  . Chronic systolic CHF (congestive heart failure) (HCC)   . Automatic implantable cardioverter-defibrillator in situ     a. Medtronic ICD.  [name] History of rheumatic fever 1962  . Myocardial infarction [facility]) 1998; 2002; ~ 2010  . Depression   . TIA (transient ischemic attack)     a. Dx 11/2014, continued on ASA/Plavix.  [name] PVC's (premature ventricular contractions)   . RBBB     Past Surgical History  Procedure Laterality Date  . Defibrillator implantation  04/12/02    primary prevention for sudden death; Dr. Graciela Husbands   . Cardiac defibrillator placement      primary prevention for sudden death; Dr. Graciela Husbands   . Cardiac catheterization      "lots of those" (08/29/2012)  . Coronary angioplasty with stent placement  2002-08/29/2012    "think I had 4 total then 2 today" to LAD 06/01/2011/notes (08/29/2012)  . Laparoscopic cholecystectomy    . Hernia repair      "  w/gallbladder OR" (08/29/2012)  . Percutaneous coronary stent intervention (pci-s) N/A 06/01/2011    Procedure: PERCUTANEOUS CORONARY STENT INTERVENTION (PCI-S);  Surgeon: Peter M Swaziland, MD;  Location: [facility] CATH LAB;  Service: Cardiovascular;  Laterality: N/A;  . Left heart catheterization with coronary angiogram N/A 06/08/2011    Procedure: LEFT HEART CATHETERIZATION WITH CORONARY ANGIOGRAM;  Surgeon: Dolores Patty, MD;  Location: [facility] CATH LAB;  Service: Cardiovascular;  Laterality: N/A;  . Percutaneous coronary stent intervention  (pci-s) N/A 08/29/2012    Procedure: PERCUTANEOUS CORONARY STENT INTERVENTION (PCI-S);  Surgeon: Peter M Swaziland, MD;  Location: [facility] CATH LAB;  Service: Cardiovascular;  Laterality: N/A;    Current Outpatient Prescriptions  Medication Sig Dispense Refill  . aspirin EC 81 MG tablet Take 81 mg by mouth daily.    [name] atorvastatin (LIPITOR) 40 MG tablet Take 1 tablet (40 mg total) by mouth daily at 6 PM. 30 tablet 1  . carvedilol (COREG) 3.125 MG tablet Take 1 tablet (3.125 mg total) by mouth 2 (two) times daily with a meal. 60 tablet 1  . clopidogrel (PLAVIX) 75 MG tablet Take 1 tablet (75 mg total) by mouth daily. Take one table by mouth everyday with breakfast. 30 tablet 2  . nitroGLYCERIN (NITROSTAT) 0.4 MG SL tablet Place 1 tablet (0.4 mg total) under the tongue every 5 (five) minutes as needed. For chest pain 25 tablet 11  . sertraline (ZOLOFT) 50 MG tablet Take 1 tablet (50 mg total) by mouth daily. 30 tablet 1  . traZODone (DESYREL) 50 MG tablet Take 1 tablet (50 mg total) by mouth at bedtime as needed for sleep. 30 tablet 1   No current facility-administered medications for this visit.    Allergies:   Review of patient's allergies indicates no known allergies.   Social History:  The patient  reports that he has been smoking Cigarettes.  He has a 22 pack-year smoking history. He has never used smokeless tobacco. He reports that he does not drink alcohol or use illicit drugs.   Family History:  The patient's family history includes Cancer in his father, sister, and another family member; Hypertension in his brother. There is no history of Heart attack or Stroke.  ROS:  Please see the history of present illness.  All other systems are reviewed and otherwise negative.   PHYSICAL EXAM:  VS:  BP 98/60 mmHg  Pulse 75  Ht  (1.803 m)  Wt 210 lb (95.255 kg)  BMI 29.30 kg/m2 BMI: Body mass index is 29.3 kg/(m^2). Well nourished, well developed WM, in no acute distress HEENT: normocephalic,  atraumatic Neck: no JVD, carotid bruits or masses Cardiac:  normal S1, S2; RRR; no murmurs, rubs, or gallops Lungs:  clear to auscultation bilaterally, no wheezing, rhonchi or rales Abd: soft, nontender, no hepatomegaly, + BS MS: no deformity or atrophy Ext: no edema Skin: warm and dry, no rash Neuro:  moves all extremities spontaneously, no focal abnormalities noted, follows commands Psych: euthymic mood, full affect  EKG:  Done today shows NSR, left axis deviation, RBBB, anteroseptal infarct, age undetermined, no sig change from prior.  Recent Labs: 12/11/2014: ALT 16*; BUN 11; Creatinine, Ser 1.07; Hemoglobin 14.2; Magnesium 2.0; Platelets 198; Potassium 4.1; Sodium 135; TSH 1.739  12/09/2014: Cholesterol 183; HDL 41; LDL Cholesterol 130*; Total CHOL/HDL Ratio 4.5; Triglycerides 61; VLDL 12   Estimated Creatinine Clearance: 85.4 mL/min (by C-G formula based on Cr of 1.07).   Wt Readings from Last 3 Encounters:  12/26/14 210  lb (95.255 kg)  12/12/14 202 lb 1.6 oz (91.672 kg)  12/06/14 205 lb 9.6 oz (93.26 kg)     Other studies reviewed: Additional studies/records reviewed today include: summarized above  ASSESSMENT AND PLAN:  1. TIA - continue aspirin and Plavix. 2. Ischemic cardiomyopathy/chronic systolic CHF - weight is up in clinic today but he is wearing heavy boots and overalls. He reports stable weight at home and no symptoms of CHF. Reviewed importance of daily weights, sodium restriction, & observation/notification to our office for CHF symptoms. Unfortunately his BP prohibits further med titration at this time. Will continue BB. 3. Essential HTN - BP tending to run on the lower side now. Will not make any changes at this time. 4. Hyperlipidemia - continue statin. 5. CAD - no recent angina noted. 6. PVCs/history of paroxysmal VT - PVCs improved in the hospital after BB re-initiation. He has not had any suspicious palpitations. Continue to follow with EP for his  VT/ICD. 7. Tobacco abuse - we discussed importance of cessation. I empathized with him about his social situation but also told him I think his chronic DOE has the chance to worsen if he continues to smoke.  Disposition: F/u with Dr. Jens Somrenshaw in 3 months.  Current medicines are reviewed at length with the patient today.  The patient did not have any concerns regarding medicines.  Thomasene MohairSigned, Dayna Dunn PA-C 12/26/2014 11:39 AM     [facility] [address] [place] [address] [phone] (office)  [phone] (fax)

## 2014-12-26 ENCOUNTER — Encounter: Payer: Self-pay | Admitting: Physician Assistant

## 2014-12-26 ENCOUNTER — Ambulatory Visit (INDEPENDENT_AMBULATORY_CARE_PROVIDER_SITE_OTHER): Payer: Self-pay | Admitting: Physician Assistant

## 2014-12-26 VITALS — BP 98/60 | HR 75 | Ht 71.0 in | Wt 210.0 lb

## 2014-12-26 DIAGNOSIS — I10 Essential (primary) hypertension: Secondary | ICD-10-CM

## 2014-12-26 DIAGNOSIS — I4510 Unspecified right bundle-branch block: Secondary | ICD-10-CM

## 2014-12-26 DIAGNOSIS — G459 Transient cerebral ischemic attack, unspecified: Secondary | ICD-10-CM

## 2014-12-26 DIAGNOSIS — Z720 Tobacco use: Secondary | ICD-10-CM

## 2014-12-26 DIAGNOSIS — I255 Ischemic cardiomyopathy: Secondary | ICD-10-CM

## 2014-12-26 DIAGNOSIS — Z9861 Coronary angioplasty status: Secondary | ICD-10-CM

## 2014-12-26 DIAGNOSIS — I493 Ventricular premature depolarization: Secondary | ICD-10-CM

## 2014-12-26 DIAGNOSIS — I2510 Atherosclerotic heart disease of native coronary artery without angina pectoris: Secondary | ICD-10-CM

## 2014-12-26 DIAGNOSIS — I5022 Chronic systolic (congestive) heart failure: Secondary | ICD-10-CM

## 2014-12-26 DIAGNOSIS — E785 Hyperlipidemia, unspecified: Secondary | ICD-10-CM

## 2014-12-26 NOTE — Patient Instructions (Signed)
Medication Instructions:  Your physician recommends that you continue on your current medications as directed. Please refer to the Current Medication list given to you today.  Labwork: NONE  Testing/Procedures: NONE  Follow-Up: Your physician recommends that you schedule a follow-up appointment in: 3 months with Dr Jens Somrenshaw.   If you need a refill on your cardiac medications before your next appointment, please call your pharmacy.

## 2015-01-07 ENCOUNTER — Telehealth: Payer: Self-pay | Admitting: Internal Medicine

## 2015-01-07 NOTE — Telephone Encounter (Signed)
Agree with holding zoloft; call if diarrhea does not improve; he will need primary care physician. Olga MillersBrian Gray Maugeri

## 2015-01-07 NOTE — Telephone Encounter (Signed)
New Message  Pt calling to speak w/ Rn concerning his med list from recent hospital stay. Please call back and discus.

## 2015-01-07 NOTE — Telephone Encounter (Signed)
I spoke with the patient. He was calling today with concerns over frequent episodes of diarrhea that he has been having. He is unsure of when this started, but feels this may be related to one of his medications. He was discharged on 12/12/14 by [facility] and states all of her medications are technically new. He is currently on zoloft. He was on this in the past and recalled having symptoms of diarrhea previously. He has held zoloft over the last 2-3 days and feels that his diarrhea has improved slightly, but not totally resolved. The patient does not have a PCP. I advised him that when his zoloft refill runs out, that cardiology typically does not fill that and I am uncertain if this is the cause of his diarrhea. I recommended he hold this another day or 2 to see if he has continued improvement in symptoms, and if not, he could try holding his lipitor for about 5-7 days to see if diarrhea totally resolves. He is due to follow up with Dr. Jens Somrenshaw in February. I advised the patient I would forward to Dr. Jens Somrenshaw as an Lorain ChildesFYI and see if he has any further recommendations. The patient verbalizes understanding.

## 2015-02-27 ENCOUNTER — Ambulatory Visit: Payer: MEDICAID | Admitting: Neurology

## 2015-02-28 ENCOUNTER — Encounter: Payer: Self-pay | Admitting: Neurology

## 2015-03-07 ENCOUNTER — Ambulatory Visit (INDEPENDENT_AMBULATORY_CARE_PROVIDER_SITE_OTHER): Payer: Self-pay | Admitting: *Deleted

## 2015-03-07 ENCOUNTER — Telehealth: Payer: Self-pay | Admitting: Cardiology

## 2015-03-07 DIAGNOSIS — I255 Ischemic cardiomyopathy: Secondary | ICD-10-CM

## 2015-03-07 NOTE — Telephone Encounter (Signed)
LMOVM reminding pt to send remote transmission.   

## 2015-03-08 NOTE — Telephone Encounter (Signed)
Patient called back asking about vm message.   I explained to patient that his remote was not received. Patient voiced understanding and plans to try to resend.

## 2015-03-11 NOTE — Progress Notes (Signed)
Remote ICD transmission.   

## 2015-03-18 NOTE — Progress Notes (Signed)
HPI: FU hypertension, hyperlipidemia, history of rheumatic fever, CAD s/p multiple stents as below, paroxysmal ventricular tachycardia, ischemic cardiomyopathy, chronic systolic heart failure s/p Medtronic AICD. Cardiac catheterization July 2014 showed a normal left main, patent stents in the LAD with an 80-90% second diagonal (jailed), 90% proximal circumflex, occluded first obtuse marginal, 80-90% second obtuse marginal and ejection fraction 25%. Patient had PCI of his circumflex and second obtuse marginal at that time. He was admitted 11/2014 with aphasia and confusion and felt to have TIA. He could not undergo MRI due to pacemaker. He had been noncompliant with all of his home medications prior to admission. He was continued on ASA and Plavix. Neurology recommended anticoagulation with Coumadin but internal medicine discussed with the cardiology team who felt he was not a good candidate given his poor compliance. Device did not show any evidence of atrial fibrillation. 2D Echo 12/09/14: EF 15%, grade 2 DD, high ventricular filling pressures, multiple WMA, mild-mod AI, mod MR, mildly dilated RA, severely dilated LA. Since last seen, Patient notes some dyspnea on exertion and orthopnea but no PND or pedal edema. No chest pain, palpitations or syncope.  Current Outpatient Prescriptions  Medication Sig Dispense Refill  . aspirin EC 81 MG tablet Take 81 mg by mouth daily.    [name] atorvastatin (LIPITOR) 40 MG tablet Take 1 tablet (40 mg total) by mouth daily at 6 PM. 30 tablet 1  . carvedilol (COREG) 3.125 MG tablet Take 1 tablet (3.125 mg total) by mouth 2 (two) times daily with a meal. 60 tablet 1  . clopidogrel (PLAVIX) 75 MG tablet Take 1 tablet (75 mg total) by mouth daily. Take one table by mouth everyday with breakfast. 30 tablet 2  . nitroGLYCERIN (NITROSTAT) 0.4 MG SL tablet Place 1 tablet (0.4 mg total) under the tongue every 5 (five) minutes as needed. For chest pain 25 tablet 11  .  sertraline (ZOLOFT) 50 MG tablet Take 1 tablet (50 mg total) by mouth daily. 30 tablet 1  . traZODone (DESYREL) 50 MG tablet Take 1 tablet (50 mg total) by mouth at bedtime as needed for sleep. 30 tablet 1   No current facility-administered medications for this visit.     Past Medical History  Diagnosis Date  . HTN (hypertension)   . HLD (hyperlipidemia) mixed  . Ischemic cardiomyopathy     a.  EF 30-35% 2010;  b.  EF 20-25% by LV gram 08/2012. c. EF 15% by echo 11/2014.  [name] CAD (coronary artery disease)     a.  Severe LAD stenosis 2/2 acute thrombus - BMS 2009;  b. 06/01/11 Cath - LAD 20 isr, 39m (3.0x16 Veri-flex BMS), OM1 100p, EF 20-25%;  c. 07/2012 Abnl Cardiolite;  d. 08/2012 Cath/PCI: LM nl, LAD patent stents, D2 80-90 (jailed), LCX 90 p/m (4.0x24 Promus Premier DES), OM1 100, OM2 80-90p (3.0x20 Promus Premier DES), RCA patent mid stent, 40d, EF 25%.  . Anxiety   . Paroxysmal ventricular tachycardia (HCC)     a. VFlutter  CL 210 msec  Rx shock 04/2011  . Chronic systolic CHF (congestive heart failure) (HCC)   . Automatic implantable cardioverter-defibrillator in situ     a. Medtronic ICD.  [name] History of rheumatic fever 1962  . Myocardial infarction [facility]) 1998; 2002; ~ 2010  . Depression   . TIA (transient ischemic attack)     a. Dx 11/2014, continued on ASA/Plavix.  [name] PVC's (premature ventricular contractions)   . RBBB  Past Surgical History  Procedure Laterality Date  . Defibrillator implantation  04/12/02    primary prevention for sudden death; Dr. Graciela Husbands   . Cardiac defibrillator placement      primary prevention for sudden death; Dr. Graciela Husbands   . Cardiac catheterization      "lots of those" (08/29/2012)  . Coronary angioplasty with stent placement  2002-08/29/2012    "think I had 4 total then 2 today" to LAD 06/01/2011/notes (08/29/2012)  . Laparoscopic cholecystectomy    . Hernia repair      "w/gallbladder OR" (08/29/2012)  . Percutaneous coronary stent intervention (pci-s) N/A  06/01/2011    Procedure: PERCUTANEOUS CORONARY STENT INTERVENTION (PCI-S);  Surgeon: Peter M Swaziland, MD;  Location: [facility] CATH LAB;  Service: Cardiovascular;  Laterality: N/A;  . Left heart catheterization with coronary angiogram N/A 06/08/2011    Procedure: LEFT HEART CATHETERIZATION WITH CORONARY ANGIOGRAM;  Surgeon: Dolores Patty, MD;  Location: [facility] CATH LAB;  Service: Cardiovascular;  Laterality: N/A;  . Percutaneous coronary stent intervention (pci-s) N/A 08/29/2012    Procedure: PERCUTANEOUS CORONARY STENT INTERVENTION (PCI-S);  Surgeon: Peter M Swaziland, MD;  Location: [facility] CATH LAB;  Service: Cardiovascular;  Laterality: N/A;    Social History   Social History  . Marital Status: Married    Spouse Name: N/A  . Number of Children: N/A  . Years of Education: N/A   Occupational History  . Not on file.   Social History Main Topics  . Smoking status: Current Every Day Smoker -- 0.50 packs/day for 44 years    Types: Cigarettes    Last Attempt to Quit: 05/27/2011  . Smokeless tobacco: Never Used  . Alcohol Use: No     Comment: 08/29/2012 "6-8 beers once/wk". Has not had alcohol in the last 6-8 months  . Drug Use: No  . Sexual Activity: Not Currently   Other Topics Concern  . Not on file   Social History Narrative   Married; full time.     Family History  Problem Relation Age of Onset  . Cancer      family hx  . Cancer Father   . Cancer Sister     twin sister and only one has cancer, unknown what kind  . Hypertension Brother   . Heart attack Neg Hx   . Stroke Neg Hx     ROS: Patient describes some depression from his wife leaving but no fevers or chills, productive cough, hemoptysis, dysphasia, odynophagia, melena, hematochezia, dysuria, hematuria, rash, seizure activity, orthopnea, PND, pedal edema, claudication. Remaining systems are negative.  Physical Exam: Well-developed well-nourished in no acute distress.  Skin is warm and dry.  HEENT is normal.  Neck is supple.    Chest is clear to auscultation with normal expansion.  Cardiovascular exam is regular rate and rhythm.  Abdominal exam nontender or distended. No masses palpated. Extremities show no edema. neuro grossly intact

## 2015-03-29 ENCOUNTER — Ambulatory Visit (INDEPENDENT_AMBULATORY_CARE_PROVIDER_SITE_OTHER): Payer: Self-pay | Admitting: Cardiology

## 2015-03-29 ENCOUNTER — Encounter: Payer: Self-pay | Admitting: Cardiology

## 2015-03-29 VITALS — BP 110/68 | HR 100 | Ht 71.0 in | Wt 209.0 lb

## 2015-03-29 DIAGNOSIS — F17200 Nicotine dependence, unspecified, uncomplicated: Secondary | ICD-10-CM

## 2015-03-29 DIAGNOSIS — R0602 Shortness of breath: Secondary | ICD-10-CM

## 2015-03-29 MED ORDER — CLOPIDOGREL BISULFATE 75 MG PO TABS: 75.0000 mg | ORAL_TABLET | Freq: Every day | ORAL | Status: DC

## 2015-03-29 MED ORDER — CARVEDILOL 3.125 MG PO TABS: 3.1250 mg | ORAL_TABLET | Freq: Two times a day (BID) | ORAL | Status: DC

## 2015-03-29 MED ORDER — ATORVASTATIN CALCIUM 40 MG PO TABS: 40.0000 mg | ORAL_TABLET | Freq: Every day | ORAL | Status: DC

## 2015-03-29 MED ORDER — FUROSEMIDE 20 MG PO TABS: 20.0000 mg | ORAL_TABLET | Freq: Every day | ORAL | Status: DC

## 2015-03-29 MED ORDER — NITROGLYCERIN 0.4 MG SL SUBL: 0.4000 mg | SUBLINGUAL_TABLET | SUBLINGUAL | Status: DC | PRN

## 2015-03-29 MED ORDER — LISINOPRIL 2.5 MG PO TABS: 2.5000 mg | ORAL_TABLET | Freq: Every day | ORAL | Status: DC

## 2015-03-29 NOTE — Patient Instructions (Signed)
Medication Instructions:   START FUROSEMIDE 20 MG ONCE DAILY  START LISINOPRIL 2.5 MG ONCE DAILY  Labwork:  Your physician recommends that you return for lab work in: ONE WEEK  Follow-Up:  Your physician recommends that you schedule a follow-up appointment in: EXTENDER IN 2 WEEKS  Your physician recommends that you schedule a follow-up appointment in: 3 MONTHS WITH DR Jens Som    If you need a refill on your cardiac medications before your next appointment, please call your pharmacy.

## 2015-03-29 NOTE — Assessment & Plan Note (Signed)
Previous TIA. Continue aspirin and Plavix. Felt not to be a Coumadin candidate because of compliance issues.

## 2015-03-29 NOTE — Assessment & Plan Note (Signed)
Continue beta blocker. Add lisinopril 2.5 mg daily.Check potassium, renal function and BNP in 1 week.

## 2015-03-29 NOTE — Assessment & Plan Note (Signed)
Patient complaining of increased dyspnea on exertion and orthopnea. He is not markedly volume overloaded on examination.Add Lasix 20 mg daily. In 1 week check potassium, renal function and BNP.

## 2015-03-29 NOTE — Assessment & Plan Note (Signed)
Continue aspirin and statin. 

## 2015-03-29 NOTE — Assessment & Plan Note (Signed)
Patient counseled on discontinuing. 

## 2015-03-29 NOTE — Assessment & Plan Note (Signed)
Blood pressure controlled. Continue present medications. 

## 2015-03-29 NOTE — Assessment & Plan Note (Signed)
Management per electrophysiology. 

## 2015-03-29 NOTE — Assessment & Plan Note (Signed)
Continue statin. 

## 2015-04-03 ENCOUNTER — Encounter: Payer: Self-pay | Admitting: Cardiology

## 2015-04-05 LAB — BASIC METABOLIC PANEL
BUN: 19 mg/dL (ref 7–25)
CALCIUM: 9.6 mg/dL (ref 8.6–10.3)
CO2: 26 mmol/L (ref 20–31)
Chloride: 104 mmol/L (ref 98–110)
Creat: 1.13 mg/dL (ref 0.70–1.25)
Glucose, Bld: 96 mg/dL (ref 65–99)
Potassium: 5 mmol/L (ref 3.5–5.3)
SODIUM: 138 mmol/L (ref 135–146)

## 2015-04-05 LAB — BRAIN NATRIURETIC PEPTIDE: BRAIN NATRIURETIC PEPTIDE: 387.7 pg/mL — AB (ref <100)

## 2015-04-09 ENCOUNTER — Encounter: Payer: Self-pay | Admitting: Physician Assistant

## 2015-04-09 ENCOUNTER — Ambulatory Visit (INDEPENDENT_AMBULATORY_CARE_PROVIDER_SITE_OTHER): Payer: Self-pay | Admitting: Physician Assistant

## 2015-04-09 VITALS — BP 100/60 | HR 84 | Ht 71.0 in | Wt 203.0 lb

## 2015-04-09 DIAGNOSIS — N529 Male erectile dysfunction, unspecified: Secondary | ICD-10-CM | POA: Insufficient documentation

## 2015-04-09 DIAGNOSIS — I5023 Acute on chronic systolic (congestive) heart failure: Secondary | ICD-10-CM | POA: Insufficient documentation

## 2015-04-09 DIAGNOSIS — N528 Other male erectile dysfunction: Secondary | ICD-10-CM

## 2015-04-09 DIAGNOSIS — Z79899 Other long term (current) drug therapy: Secondary | ICD-10-CM

## 2015-04-09 DIAGNOSIS — I5022 Chronic systolic (congestive) heart failure: Secondary | ICD-10-CM

## 2015-04-09 DIAGNOSIS — F329 Major depressive disorder, single episode, unspecified: Secondary | ICD-10-CM

## 2015-04-09 DIAGNOSIS — F32A Depression, unspecified: Secondary | ICD-10-CM

## 2015-04-09 MED ORDER — SILDENAFIL CITRATE 50 MG PO TABS: 50.0000 mg | ORAL_TABLET | Freq: Every day | ORAL | Status: DC | PRN

## 2015-04-09 MED ORDER — SERTRALINE HCL 50 MG PO TABS: 75.0000 mg | ORAL_TABLET | Freq: Every day | ORAL | Status: DC

## 2015-04-09 NOTE — Progress Notes (Signed)
Cardiology Office Note   Date:  04/09/2015   ID:  [name], DOB [birth year], MRN [id number]  PCP:  Pcp Not In System  Cardiologist:  Dr Jens Som, Dr Lorenso Quarry, PA-C   Chief Complaint  Patient presents with  . Follow-up    no chest pain, no swelling, shortness of breath is better, no cramping, no dizziness or lightheadedness    History of Present Illness: [name] is a 60 y.o. male with a history of ICM, EF 15% 11/2014 echo, HTN, HLD, VT s/p MDT ICD, S-CHF, Cath 2014 w/ multivessel dz, s/p PCI CFX & OM2, o/w med rx, ongoing tob use.  Seen by [facility] 02/03, lasix added, early f/u w/ BMET rec.  [name] presents for post hospital follow-up.  Since discharge from the hospital, [name] has done very well. He has had no change in his weight. He has had no lower extremity edema. He denies orthopnea or PND. He still has dyspnea on exertion, but can walk at least a block on flat ground without getting short of breath. He admits that he get short of breath when it comes to walking up hills but if he slows down, he can make it. He has not had chest pain.  He feels the Zoloft is not controlling his depression at this time. He wonders if we can increase the dose. He also requests a prescription for Viagra.  The systolic pressure is right at 409. He is asymptomatic with this and understands that with his blood pressure like that, it puts less stress on his heart.  He has not had any palpitations and his defibrillator has not fired.   Past Medical History  Diagnosis Date  . HTN (hypertension)   . HLD (hyperlipidemia) mixed  . Ischemic cardiomyopathy     a.  EF 30-35% 2010;  b.  EF 20-25% by LV gram 08/2012. c. EF 15% by echo 11/2014.  [name] CAD (coronary artery disease)     a.  Severe LAD stenosis 2/2 acute thrombus - BMS 2009;  b. 06/01/11 Cath - LAD 20 isr, 52m (3.0x16 Veri-flex BMS), OM1 100p, EF 20-25%;  c. 07/2012 Abnl Cardiolite;  d. 08/2012 Cath/PCI: LM nl, LAD patent  stents, D2 80-90 (jailed), LCX 90 p/m (4.0x24 Promus Premier DES), OM1 100, OM2 80-90p (3.0x20 Promus Premier DES), RCA patent mid stent, 40d, EF 25%.  . Anxiety   . Paroxysmal ventricular tachycardia (HCC)     a. VFlutter  CL 210 msec  Rx shock 04/2011  . Chronic systolic CHF (congestive heart failure) (HCC)   . Automatic implantable cardioverter-defibrillator in situ     a. Medtronic ICD.  [name] History of rheumatic fever 1962  . Myocardial infarction [facility]) 1998; 2002; ~ 2010  . Depression   . TIA (transient ischemic attack)     a. Dx 11/2014, continued on ASA/Plavix.  [name] PVC's (premature ventricular contractions)   . RBBB     Past Surgical History  Procedure Laterality Date  . Defibrillator implantation  04/12/02    primary prevention for sudden death; Dr. Graciela Husbands   . Cardiac defibrillator placement      primary prevention for sudden death; Dr. Graciela Husbands   . Cardiac catheterization      "lots of those" (08/29/2012)  . Coronary angioplasty with stent placement  2002-08/29/2012    "think I had 4 total then 2 today" to LAD 06/01/2011/notes (08/29/2012)  . Laparoscopic cholecystectomy    . Hernia repair      "  w/gallbladder OR" (08/29/2012)  . Percutaneous coronary stent intervention (pci-s) N/A 06/01/2011    Procedure: PERCUTANEOUS CORONARY STENT INTERVENTION (PCI-S);  Surgeon: Peter M Swaziland, MD;  Location: [facility] CATH LAB;  Service: Cardiovascular;  Laterality: N/A;  . Left heart catheterization with coronary angiogram N/A 06/08/2011    Procedure: LEFT HEART CATHETERIZATION WITH CORONARY ANGIOGRAM;  Surgeon: Dolores Patty, MD;  Location: [facility] CATH LAB;  Service: Cardiovascular;  Laterality: N/A;  . Percutaneous coronary stent intervention (pci-s) N/A 08/29/2012    Procedure: PERCUTANEOUS CORONARY STENT INTERVENTION (PCI-S);  Surgeon: Peter M Swaziland, MD;  Location: [facility] CATH LAB;  Service: Cardiovascular;  Laterality: N/A;    Current Outpatient Prescriptions  Medication Sig Dispense Refill  . aspirin EC 81  MG tablet Take 81 mg by mouth daily.    [name] atorvastatin (LIPITOR) 40 MG tablet Take 1 tablet (40 mg total) by mouth daily at 6 PM. 30 tablet 11  . carvedilol (COREG) 3.125 MG tablet Take 1 tablet (3.125 mg total) by mouth 2 (two) times daily with a meal. 60 tablet 11  . clopidogrel (PLAVIX) 75 MG tablet Take 1 tablet (75 mg total) by mouth daily. Take one table by mouth everyday with breakfast. 30 tablet 11  . furosemide (LASIX) 20 MG tablet Take 1 tablet (20 mg total) by mouth daily. 30 tablet 11  . lisinopril (PRINIVIL,ZESTRIL) 2.5 MG tablet Take 1 tablet (2.5 mg total) by mouth daily. 30 tablet 11  . nitroGLYCERIN (NITROSTAT) 0.4 MG SL tablet Place 1 tablet (0.4 mg total) under the tongue every 5 (five) minutes as needed. For chest pain 25 tablet 11  . sertraline (ZOLOFT) 50 MG tablet Take 1 tablet (50 mg total) by mouth daily. 30 tablet 1  . traZODone (DESYREL) 50 MG tablet Take 1 tablet (50 mg total) by mouth at bedtime as needed for sleep. 30 tablet 1   No current facility-administered medications for this visit.    Allergies:   Review of patient's allergies indicates no known allergies.    Social History:  The patient  reports that he has been smoking Cigarettes.  He has a 22 pack-year smoking history. He has never used smokeless tobacco. He reports that he does not drink alcohol or use illicit drugs.   Family History:  The patient's family history includes Cancer in his father and sister; Hypertension in his brother. There is no history of Heart attack or Stroke.    ROS:  Please see the history of present illness. All other systems are reviewed and negative.    PHYSICAL EXAM: VS:  BP 100/60 mmHg  Pulse 84  Ht 5\' 11"  (1.803 m)  Wt 203 lb (92.08 kg)  BMI 28.33 kg/m2 , BMI Body mass index is 28.33 kg/(m^2). GEN: Well nourished, well developed, male in no acute distress HEENT: normal for age  Neck: no JVD, no carotid bruit, no masses Cardiac: RRR;soft murmur, no rubs, or  gallops Respiratory:  clear to auscultation bilaterally, normal work of breathing GI: soft, nontender, nondistended, + BS MS: no deformity or atrophy; no edema; distal pulses are 2+ in all 4 extremities  Skin: warm and dry, no rash Neuro:  Strength and sensation are intact Psych: euthymic mood, full affect   EKG:  EKG is not ordered today.  Recent Labs: 12/11/2014: ALT 16*; Hemoglobin 14.2; Magnesium 2.0; Platelets 198; TSH 1.739 04/04/2015: BUN 19; Creat 1.13; Potassium 5.0; Sodium 138    Lipid Panel    Component Value Date/Time   CHOL 183 12/09/2014  0310   TRIG 61 12/09/2014 0310   HDL 41 12/09/2014 0310   CHOLHDL 4.5 12/09/2014 0310   VLDL 12 12/09/2014 0310   LDLCALC 130* 12/09/2014 0310   LDLDIRECT 159.2 02/04/2010 1622     Wt Readings from Last 3 Encounters:  04/09/15 203 lb (92.08 kg)  03/29/15 209 lb (94.802 kg)  12/26/14 210 lb (95.255 kg)     Other studies Reviewed: Additional studies/ records that were reviewed today include: Hospital records, office notes and testing.  ASSESSMENT AND PLAN:  1.  Chronic systolic CHF/ischemic cardiomyopathy: He had not been weighing himself every day on his home scales but promises he will do that and recorded. He is encouraged to continue his current regimen as his weight is stable and he feels better. He is encouraged to increase his activity gradually. Check a BMET today  2. Erectile dysfunction: A prescription for Viagra was given with the understanding that he must not use nitroglycerin within 24 hours of taking. The patient understands and agrees.  3. Depression: We will increase his Zoloft.   Current medicines are reviewed at length with the patient today.  The patient does not have concerns regarding medicines.  The following changes have been made:  increase Zoloft, add Viagra  Labs/ tests ordered today include:   Orders Placed This Encounter  Procedures  . Basic metabolic panel     Disposition:   Dr.  Jens Som and Dr. Graciela Husbands  Signed, Barrett, Hampton, Cordelia Poche  04/09/2015 5:51 PM    [facility] [address] [place], [place], [place]  [address] Phone: [phone]; Fax: [phone]

## 2015-04-09 NOTE — Patient Instructions (Signed)
Medication Instructions: Theodore Demark, PA-C, has recommended making the following medication changes: INCREASE Sertraline (Zoloft) to 75 mg - take 1.5 tablets (75 mg total) by mouth daily START Viagra 50 mg - take 1 tablet by mouth as needed  >>Samples have been provided  >>New prescriptions have been sent to the pharmacy electronically  Labwork: Your physician recommends that you return for lab work TODAY.  Testing/Procedures: NONE  Follow-up: Bjorn Loser recommends that you schedule a follow-up appointment in 3 months with Dr Jens Som.  If you need a refill on your cardiac medications before your next appointment, please call your pharmacy.

## 2015-04-10 LAB — BASIC METABOLIC PANEL
BUN: 16 mg/dL (ref 7–25)
CHLORIDE: 102 mmol/L (ref 98–110)
CO2: 28 mmol/L (ref 20–31)
CREATININE: 1.07 mg/dL (ref 0.70–1.25)
Calcium: 9.3 mg/dL (ref 8.6–10.3)
GLUCOSE: 92 mg/dL (ref 65–99)
Potassium: 4.2 mmol/L (ref 3.5–5.3)
Sodium: 139 mmol/L (ref 135–146)

## 2015-06-06 ENCOUNTER — Encounter: Payer: Self-pay | Admitting: *Deleted

## 2015-06-10 ENCOUNTER — Encounter: Payer: Self-pay | Admitting: Cardiology

## 2015-06-19 ENCOUNTER — Emergency Department (HOSPITAL_COMMUNITY): Payer: Self-pay

## 2015-06-19 ENCOUNTER — Other Ambulatory Visit: Payer: Self-pay

## 2015-06-19 ENCOUNTER — Inpatient Hospital Stay (HOSPITAL_COMMUNITY)
Admission: EM | Admit: 2015-06-19 | Discharge: 2015-06-21 | DRG: 293 | Disposition: A | Discharge status: Home / Self Care | Payer: Self-pay | Attending: Cardiology | Admitting: Cardiology

## 2015-06-19 ENCOUNTER — Encounter (HOSPITAL_COMMUNITY): Payer: Self-pay | Admitting: Emergency Medicine

## 2015-06-19 DIAGNOSIS — Z9119 Patient's noncompliance with other medical treatment and regimen: Secondary | ICD-10-CM

## 2015-06-19 DIAGNOSIS — I5023 Acute on chronic systolic (congestive) heart failure: Secondary | ICD-10-CM | POA: Diagnosis present

## 2015-06-19 DIAGNOSIS — Z8673 Personal history of transient ischemic attack (TIA), and cerebral infarction without residual deficits: Secondary | ICD-10-CM

## 2015-06-19 DIAGNOSIS — Z6829 Body mass index (BMI) 29.0-29.9, adult: Secondary | ICD-10-CM

## 2015-06-19 DIAGNOSIS — R0609 Other forms of dyspnea: Secondary | ICD-10-CM

## 2015-06-19 DIAGNOSIS — R079 Chest pain, unspecified: Secondary | ICD-10-CM

## 2015-06-19 DIAGNOSIS — F17210 Nicotine dependence, cigarettes, uncomplicated: Secondary | ICD-10-CM | POA: Diagnosis present

## 2015-06-19 DIAGNOSIS — Z955 Presence of coronary angioplasty implant and graft: Secondary | ICD-10-CM

## 2015-06-19 DIAGNOSIS — Z7982 Long term (current) use of aspirin: Secondary | ICD-10-CM

## 2015-06-19 DIAGNOSIS — I110 Hypertensive heart disease with heart failure: Principal | ICD-10-CM | POA: Diagnosis present

## 2015-06-19 DIAGNOSIS — F419 Anxiety disorder, unspecified: Secondary | ICD-10-CM | POA: Diagnosis present

## 2015-06-19 DIAGNOSIS — I5021 Acute systolic (congestive) heart failure: Secondary | ICD-10-CM

## 2015-06-19 DIAGNOSIS — I2510 Atherosclerotic heart disease of native coronary artery without angina pectoris: Secondary | ICD-10-CM | POA: Diagnosis present

## 2015-06-19 DIAGNOSIS — Z79899 Other long term (current) drug therapy: Secondary | ICD-10-CM

## 2015-06-19 DIAGNOSIS — E876 Hypokalemia: Secondary | ICD-10-CM | POA: Diagnosis present

## 2015-06-19 DIAGNOSIS — F329 Major depressive disorder, single episode, unspecified: Secondary | ICD-10-CM | POA: Diagnosis present

## 2015-06-19 DIAGNOSIS — E785 Hyperlipidemia, unspecified: Secondary | ICD-10-CM | POA: Diagnosis present

## 2015-06-19 DIAGNOSIS — Z95810 Presence of automatic (implantable) cardiac defibrillator: Secondary | ICD-10-CM

## 2015-06-19 DIAGNOSIS — Z9114 Patient's other noncompliance with medication regimen: Secondary | ICD-10-CM

## 2015-06-19 DIAGNOSIS — Z7901 Long term (current) use of anticoagulants: Secondary | ICD-10-CM

## 2015-06-19 DIAGNOSIS — I255 Ischemic cardiomyopathy: Secondary | ICD-10-CM

## 2015-06-19 DIAGNOSIS — I252 Old myocardial infarction: Secondary | ICD-10-CM

## 2015-06-19 DIAGNOSIS — I4510 Unspecified right bundle-branch block: Secondary | ICD-10-CM | POA: Diagnosis present

## 2015-06-19 DIAGNOSIS — I509 Heart failure, unspecified: Secondary | ICD-10-CM

## 2015-06-19 DIAGNOSIS — Z7902 Long term (current) use of antithrombotics/antiplatelets: Secondary | ICD-10-CM

## 2015-06-19 DIAGNOSIS — N289 Disorder of kidney and ureter, unspecified: Secondary | ICD-10-CM

## 2015-06-19 DIAGNOSIS — R9389 Abnormal findings on diagnostic imaging of other specified body structures: Secondary | ICD-10-CM

## 2015-06-19 LAB — CBC WITH DIFFERENTIAL/PLATELET
BASOS PCT: 0 %
Basophils Absolute: 0 10*3/uL (ref 0.0–0.1)
Eosinophils Absolute: 0.1 10*3/uL (ref 0.0–0.7)
Eosinophils Relative: 1 %
HEMATOCRIT: 34.6 % — AB (ref 39.0–52.0)
HEMOGLOBIN: 11.2 g/dL — AB (ref 13.0–17.0)
LYMPHS PCT: 27 %
Lymphs Abs: 1.5 10*3/uL (ref 0.7–4.0)
MCH: 29.7 pg (ref 26.0–34.0)
MCHC: 32.4 g/dL (ref 30.0–36.0)
MCV: 91.8 fL (ref 78.0–100.0)
MONOS PCT: 10 %
Monocytes Absolute: 0.6 10*3/uL (ref 0.1–1.0)
NEUTROS ABS: 3.5 10*3/uL (ref 1.7–7.7)
NEUTROS PCT: 62 %
Platelets: 201 10*3/uL (ref 150–400)
RBC: 3.77 MIL/uL — ABNORMAL LOW (ref 4.22–5.81)
RDW: 14.8 % (ref 11.5–15.5)
WBC: 5.6 10*3/uL (ref 4.0–10.5)

## 2015-06-19 LAB — BASIC METABOLIC PANEL
ANION GAP: 11 (ref 5–15)
BUN: 27 mg/dL — ABNORMAL HIGH (ref 6–20)
CALCIUM: 8.7 mg/dL — AB (ref 8.9–10.3)
CHLORIDE: 103 mmol/L (ref 101–111)
CO2: 20 mmol/L — AB (ref 22–32)
Creatinine, Ser: 1.45 mg/dL — ABNORMAL HIGH (ref 0.61–1.24)
GFR calc non Af Amer: 50 mL/min — ABNORMAL LOW (ref >60)
GFR, EST AFRICAN AMERICAN: 58 mL/min — AB (ref >60)
GLUCOSE: 92 mg/dL (ref 65–99)
Potassium: 5 mmol/L (ref 3.5–5.1)
Sodium: 134 mmol/L — ABNORMAL LOW (ref 135–145)

## 2015-06-19 LAB — I-STAT TROPONIN, ED: Troponin i, poc: 0.02 ng/mL (ref 0.00–0.08)

## 2015-06-19 LAB — BRAIN NATRIURETIC PEPTIDE: B NATRIURETIC PEPTIDE 5: 1149.4 pg/mL — AB (ref 0.0–100.0)

## 2015-06-19 MED ORDER — DEXTROSE 5 % IV SOLN
1.0000 g | Freq: Once | INTRAVENOUS | Status: AC
  Administered 2015-06-19: 1 g via INTRAVENOUS
  Filled 2015-06-19: qty 10

## 2015-06-19 MED ORDER — ASPIRIN 81 MG PO CHEW
324.0000 mg | CHEWABLE_TABLET | Freq: Once | ORAL | Status: AC
  Administered 2015-06-19: 324 mg via ORAL
  Filled 2015-06-19: qty 4

## 2015-06-19 MED ORDER — AZITHROMYCIN 250 MG PO TABS
500.0000 mg | ORAL_TABLET | Freq: Once | ORAL | Status: AC
  Administered 2015-06-19: 500 mg via ORAL
  Filled 2015-06-19: qty 2

## 2015-06-19 NOTE — Progress Notes (Signed)
Patient received from ED, tele monitoring called in, oriented to room. No needs at this time, call light within reach.

## 2015-06-19 NOTE — ED Notes (Signed)
Nutrition made aware pt has bed upstairs, will deliver dinner to 2W

## 2015-06-19 NOTE — ED Notes (Signed)
Placed order for pt. Tray   
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
## 2015-06-19 NOTE — H&P (Signed)
Patient ID: [name] MRN: [id number], DOB/AGE: 06/24/1955   Admit date: 06/19/2015   Primary Physician: Pcp Not In System Primary Cardiologist: Dr. Jens Som Primary Electrophysiologist: Dr. Graciela Husbands  Referred by Dr. Madilyn Hook ED Physician  Pt. Profile:  [name] is a 60 year old male with PMH of CAD s/p multiple PCI, ICM with baseline EF 15% percent on last echo 11/2014, HTN, HLD, chronic systolic heart failure, and h/o VT s/p Medtronic ICD presented with progressive DOE and 1 episode chest pressure   Problem List  Past Medical History  Diagnosis Date  . HTN (hypertension)   . HLD (hyperlipidemia) mixed  . Ischemic cardiomyopathy     a.  EF 30-35% 2010;  b.  EF 20-25% by LV gram 08/2012. c. EF 15% by echo 11/2014.  [name] CAD (coronary artery disease)     a.  Severe LAD stenosis 2/2 acute thrombus - BMS 2009;  b. 06/01/11 Cath - LAD 20 isr, 44m (3.0x16 Veri-flex BMS), OM1 100p, EF 20-25%;  c. 07/2012 Abnl Cardiolite;  d. 08/2012 Cath/PCI: LM nl, LAD patent stents, D2 80-90 (jailed), LCX 90 p/m (4.0x24 Promus Premier DES), OM1 100, OM2 80-90p (3.0x20 Promus Premier DES), RCA patent mid stent, 40d, EF 25%.  . Anxiety   . Paroxysmal ventricular tachycardia (HCC)     a. VFlutter  CL 210 msec  Rx shock 04/2011  . Chronic systolic CHF (congestive heart failure) (HCC)   . Automatic implantable cardioverter-defibrillator in situ     a. Medtronic ICD.  [name] History of rheumatic fever 1962  . Myocardial infarction [facility]) 1998; 2002; ~ 2010  . Depression   . TIA (transient ischemic attack)     a. Dx 11/2014, continued on ASA/Plavix.  [name] PVC's (premature ventricular contractions)   . RBBB     Past Surgical History  Procedure Laterality Date  . Defibrillator implantation  04/12/02    primary prevention for sudden death; Dr. Graciela Husbands   . Cardiac defibrillator placement      primary prevention for sudden death; Dr. Graciela Husbands   . Cardiac catheterization      "lots of those" (08/29/2012)  . Coronary  angioplasty with stent placement  2002-08/29/2012    "think I had 4 total then 2 today" to LAD 06/01/2011/notes (08/29/2012)  . Laparoscopic cholecystectomy    . Hernia repair      "w/gallbladder OR" (08/29/2012)  . Percutaneous coronary stent intervention (pci-s) N/A 06/01/2011    Procedure: PERCUTANEOUS CORONARY STENT INTERVENTION (PCI-S);  Surgeon: Peter M Swaziland, MD;  Location: [facility] CATH LAB;  Service: Cardiovascular;  Laterality: N/A;  . Left heart catheterization with coronary angiogram N/A 06/08/2011    Procedure: LEFT HEART CATHETERIZATION WITH CORONARY ANGIOGRAM;  Surgeon: Dolores Patty, MD;  Location: [facility] CATH LAB;  Service: Cardiovascular;  Laterality: N/A;  . Percutaneous coronary stent intervention (pci-s) N/A 08/29/2012    Procedure: PERCUTANEOUS CORONARY STENT INTERVENTION (PCI-S);  Surgeon: Peter M Swaziland, MD;  Location: [facility] CATH LAB;  Service: Cardiovascular;  Laterality: N/A;     Allergies  No Known Allergies  HPI  [name] is a 60 year old male with PMH of CAD s/p multiple PCI, ICM with baseline EF 15% percent on last echo 11/2014, HTN, HLD, chronic systolic heart failure, and h/o VT s/p Medtronic ICD. He originally had MI in 2009 and underwent BMS to LAD.  He had another BMS placed in LAD on 06/01/2011. In 2014, he had recurrent chest pain, stress test was abnormal, he underwent cardiac catheterization on 08/23/2012  which showed EF 25%, patent stent in LAD and RCA, ostial 80-90% stenosis in D2 which is jailed from the previous LAD stent, 80-90% second OM stenosis, 90% proximal to mid left circumflex stenosis. He was brought back to the Cath Lab 4 days later and underwent high risk PCI of left circumflex and OM 2 lesion and received 2 more drug-eluting stents (3.0 x 20 mm Promus premier for OM 2, 4.0 x 24 mm Promus premier in left circumflex). Since then he has been doing relatively well. Last echocardiogram obtained on 12/09/2014 showed EF 15%, grade 2 diastolic dysfunction, multiple regional  wall motion abnormalities include akinesis of mid apical anterior, apical inferior, mid anterolateral, apical and lateral, and apical myocardium, severe hypokinesis of mid anteroseptal, mid inferior, and apical septal myocardium, mild-to-moderate AR, moderate MR. Despite having EF of only 15%, he has been doing relatively well on only 20 mg daily of Lasix. He does have problem with compliance with home medication.   He was last admitted in October 2016 with aphasia and confusion and felt to have TIA. He could not undergo MRI due to pacemaker. Neurology recommended anticoagulation with Coumadin, however after discussing with cardiology, it was felt the patient was not a good candidate due to his poor compliance. He continued to take his medication only sporadically. He says he is medically compliant with Lasix, however he has not taken his other medication since last weekend. For the week past week and half, he has been noticing increasing dyspnea on exertion however no chest pain. The last time he had chest pain was over a month ago. He also had some dry cough and subjective fever. He denies LE edema or PND, he has been sleeping upright to help with SOB.  Today, after locking his motorcycle in the garage, he was walking toward his house when he noticed he couldn't barely walk less than 70 feet before getting short of breath. This is very unusual even given his chronic dyspnea on exertion. Therefore he called 911. While waiting for EMS to arrive, he did have an episode of chest pain, it only lasted 1 to 2 minute before going away. He has not felt any chest pain since. He described it as a localized substernal chest pressure without further radiation. On arrival to [facility] ED, significant laboratory finding include sodium 134, creatinine of 1.45 whereas his baseline creatinine is 1.07, BNP 1149, troponin 0.02, hemoglobin 11.2. EKG showed normal sinus rhythm, right bundle branch block, nonspecific ST-T wave  changes. Chest x-ray showed patchy infiltrate in the left lung base posteriorly. He was prophylactically treated with antibiotic in case of pneumonia. Cardiology has been consulted for chest pain and dyspnea on exertion.   Home Medications  Prior to Admission medications   Medication Sig Start Date End Date Taking? Authorizing Provider  atorvastatin (LIPITOR) 40 MG tablet Take 1 tablet (40 mg total) by mouth daily at 6 PM. 03/29/15  Yes [name], MD  carvedilol (COREG) 3.125 MG tablet Take 1 tablet (3.125 mg total) by mouth 2 (two) times daily with a meal. 03/29/15  Yes [name], MD  citalopram (CELEXA) 10 MG tablet Take 10 mg by mouth daily.   Yes Historical Provider, MD  clopidogrel (PLAVIX) 75 MG tablet Take 1 tablet (75 mg total) by mouth daily. Take one table by mouth everyday with breakfast. 03/29/15  Yes [name], MD  furosemide (LASIX) 20 MG tablet Take 1 tablet (20 mg total) by mouth daily. 03/29/15  Yes [name], MD  lisinopril (PRINIVIL,ZESTRIL) 20 MG tablet Take 20 mg by mouth daily.   Yes Historical Provider, MD  nitroGLYCERIN (NITROSTAT) 0.4 MG SL tablet Place 1 tablet (0.4 mg total) under the tongue every 5 (five) minutes as needed. For chest pain 03/29/15  Yes [name], MD  sildenafil (VIAGRA) 50 MG tablet Take 1 tablet (50 mg total) by mouth daily as needed for erectile dysfunction. 04/09/15  Yes [name] G Barrett, PA-C  traZODone (DESYREL) 50 MG tablet Take 1 tablet (50 mg total) by mouth at bedtime as needed for sleep. 12/12/14  Yes Costin Otelia Sergeant, MD  aspirin EC 81 MG tablet Take 81 mg by mouth daily. Reported on 06/19/2015    Historical Provider, MD  lisinopril (PRINIVIL,ZESTRIL) 2.5 MG tablet Take 1 tablet (2.5 mg total) by mouth daily. 03/29/15   [name], MD  sertraline (ZOLOFT) 50 MG tablet Take 1.5 tablets (75 mg total) by mouth daily. 04/09/15   Joline Salt Barrett, PA-C    Family History  Family History  Problem Relation Age of Onset    . Cancer      family hx  . Cancer Father   . Cancer Sister     twin sister and only one has cancer, unknown what kind  . Hypertension Brother   . Heart attack Neg Hx   . Stroke Neg Hx     Social History  Social History   Social History  . Marital Status: Married    Spouse Name: N/A  . Number of Children: N/A  . Years of Education: N/A   Occupational History  . Not on file.   Social History Main Topics  . Smoking status: Current Every Day Smoker -- 0.50 packs/day for 44 years    Types: Cigarettes    Last Attempt to Quit: 05/27/2011  . Smokeless tobacco: Never Used  . Alcohol Use: No     Comment: 08/29/2012 "6-8 beers once/wk". Has not had alcohol in the last 6-8 months  . Drug Use: No  . Sexual Activity: Not Currently   Other Topics Concern  . Not on file   Social History Narrative   Married; full time.      Review of Systems General:  No chills, fever, night sweats or weight changes.  Cardiovascular:  No edema, palpitations, paroxysmal nocturnal dyspnea. +chest pain, dyspnea on exertion Dermatological: No rash, lesions/masses Respiratory: No cough, dyspnea Urologic: No hematuria, dysuria Abdominal:   No nausea, vomiting, diarrhea, bright red blood per rectum, melena, or hematemesis Neurologic:  No visual changes, wkns, changes in mental status. All other systems reviewed and are otherwise negative except as noted above.  Physical Exam  Blood pressure 125/58, pulse 99, temperature 98.9 F (37.2 C), temperature source Oral, resp. rate 18, SpO2 99 %.  General: Pleasant, NAD Psych: Normal affect. Neuro: Alert and oriented X 3. Moves all extremities spontaneously. HEENT: Normal  Neck: Supple without bruits  + R JVD. Lungs:  Resp regular and unlabored, CTA. Heart: RRR no s3, s4, or murmurs. Abdomen: Soft, non-tender, non-distended, BS + x 4.  Extremities: No clubbing, cyanosis or edema. DP/PT/Radials 2+ and equal bilaterally.  Labs  Troponin [facility]  Test)  Recent Labs  06/19/15 1534  TROPIPOC 0.02   No results for input(s): CKTOTAL, CKMB, TROPONINI in the last 72 hours. Lab Results  Component Value Date   WBC 5.6 06/19/2015   HGB 11.2* 06/19/2015   HCT 34.6* 06/19/2015   MCV  91.8 06/19/2015   PLT 201 06/19/2015    Recent Labs Lab 06/19/15 1526  NA 134*  K 5.0  CL 103  CO2 20*  BUN 27*  CREATININE 1.45*  CALCIUM 8.7*  GLUCOSE 92   Lab Results  Component Value Date   CHOL 183 12/09/2014   HDL 41 12/09/2014   LDLCALC 130* 12/09/2014   TRIG 61 12/09/2014   No results found for: DDIMER   Radiology/Studies  Dg Chest 2 View  06/19/2015  CLINICAL DATA:  Chest pain and pressure. Shortness of breath. Coronary artery disease with multiple coronary stents. EXAM: CHEST  2 VIEW COMPARISON:  12/08/2014 FINDINGS: There is new slight cardiomegaly. AICD in place. Pulmonary vascularity is normal. There is a new patchy area of infiltrate at the a left lung base posterior medially. The lungs are otherwise clear. No bone abnormality. IMPRESSION: Patchy new infiltrate at the left lung base posteriorly. Electronically Signed   By: Francene BoyersJames  Maxwell M.D.   On: 06/19/2015 15:44    ECG  Normal sinus rhythm, right bundle branch block, nonspecific ST-T wave changes.  Echocardiogram  - Left ventricle: Systolic function is severely reduced, estimated  EF 15%. The cavity size was moderately dilated. Features are  consistent with a pseudonormal left ventricular filling pattern,  with concomitant abnormal relaxation and increased filling  pressure (grade 2 diastolic dysfunction). Doppler parameters are  consistent with high ventricular filling pressure. - Regional wall motion abnormality: Akinesis of the mid-apical  anterior, apical inferior, mid anterolateral, apical lateral, and  apical myocardium; severe hypokinesis of the mid anteroseptal,  mid inferior, and apical septal myocardium; moderate hypokinesis  of the basal  anteroseptal, basal inferior, and basal-mid  inferolateral myocardium. - Aortic valve: Mildly calcified annulus. Trileaflet. There was  mild to moderate regurgitation. - Mitral valve: There was moderate regurgitation. - Left atrium: The atrium was severely dilated. Volume/bsa, ES,  (1-plane Simpson&'s, A2C): 48.1 ml/m^2. - Right ventricle: Pacer wire or catheter noted in right ventricle.  Systolic function was mildly reduced. - Right atrium: The atrium was mildly dilated.    ASSESSMENT AND PLAN  1. Progressive DOE and chest pain  - I am less concerned about the chest pain, but more concerned with progressive dyspnea on exertion. He only had 1 episode of chest pressure which he says feels different from the episode in 2014 before his last cath. This episode only lasted 1 to 2 minutes before goes away. Prior to that, his last episode of chest pain was more than a month ago.  - As for his progressive dyspnea on exertion, chest x-ray did reveal possible pneumonia, WBC normal, he also have a BNP of 1149. I did not appreciate significant rales or lower extremity edema on physical exam, however he did have +R JVD on exam.   - Will discuss with M.D., his noncompliance issue make him a very poor candidate for another cardiac catheterization. And I do not want to do another Myoview in this case as he has high risk of having false positive. I will trend enzyme overnight and reassess in a.m. Not sure if he is dry or wet, it is very hard to tell from physical exam, even though chest x-ray showed possible pneumonia, I did not hear any rhonchi on physical exam either.  2. CAD s/p multiple PCI  - BMS to LAD 2009, BMS to LAD to 2013  - last cardiac catheterization on 08/23/2012 which showed EF 25%, patent stent in LAD and RCA, ostial 80-90% stenosis in  D2 which is jailed from the previous LAD stent, 80-90% second OM stenosis, 90% proximal to mid left circumflex stenosis. He was brought back to the Cath Lab 4  days later and underwent high risk PCI of left circumflex and OM 2 lesion and received 2 more drug-eluting stents (3.0 x 20 mm Promus premier for OM 2, 4.0 x 24 mm Promus premier in left circumflex)  3. ischemic cardiomyopathy with baseline EF 15% percent on last echo 11/2014  4. Hypertension 5. Hyperlipidemia 6. chronic systolic heart failure 7. history of VT s/p Medtronic ICD  Signed, Azalee Course, PA-C 06/19/2015, 5:18 PM

## 2015-06-20 ENCOUNTER — Observation Stay (HOSPITAL_COMMUNITY): Payer: Self-pay

## 2015-06-20 DIAGNOSIS — R918 Other nonspecific abnormal finding of lung field: Secondary | ICD-10-CM

## 2015-06-20 DIAGNOSIS — R0609 Other forms of dyspnea: Secondary | ICD-10-CM

## 2015-06-20 LAB — BASIC METABOLIC PANEL
Anion gap: 10 (ref 5–15)
BUN: 25 mg/dL — ABNORMAL HIGH (ref 6–20)
CO2: 23 mmol/L (ref 22–32)
CREATININE: 1.28 mg/dL — AB (ref 0.61–1.24)
Calcium: 8.8 mg/dL — ABNORMAL LOW (ref 8.9–10.3)
Chloride: 105 mmol/L (ref 101–111)
GFR calc Af Amer: >60 mL/min (ref >60)
GFR, EST NON AFRICAN AMERICAN: 58 mL/min — AB (ref >60)
GLUCOSE: 105 mg/dL — AB (ref 65–99)
POTASSIUM: 3.9 mmol/L (ref 3.5–5.1)
SODIUM: 138 mmol/L (ref 135–145)

## 2015-06-20 MED ORDER — SODIUM CHLORIDE 0.9% FLUSH
3.0000 mL | Freq: Two times a day (BID) | INTRAVENOUS | Status: DC
  Administered 2015-06-20 – 2015-06-21 (×4): 3 mL via INTRAVENOUS

## 2015-06-20 MED ORDER — SODIUM CHLORIDE 0.9 % IV SOLN: 250.0000 mL | INTRAVENOUS | Status: DC | PRN

## 2015-06-20 MED ORDER — ONDANSETRON HCL 4 MG/2ML IJ SOLN: 4.0000 mg | Freq: Four times a day (QID) | INTRAMUSCULAR | Status: DC | PRN

## 2015-06-20 MED ORDER — FUROSEMIDE 10 MG/ML IJ SOLN
40.0000 mg | Freq: Two times a day (BID) | INTRAMUSCULAR | Status: DC
  Administered 2015-06-20 (×2): 40 mg via INTRAVENOUS
  Filled 2015-06-20 (×2): qty 4

## 2015-06-20 MED ORDER — ACETAMINOPHEN 325 MG PO TABS: 650.0000 mg | ORAL_TABLET | ORAL | Status: DC | PRN

## 2015-06-20 MED ORDER — ATORVASTATIN CALCIUM 40 MG PO TABS
40.0000 mg | ORAL_TABLET | Freq: Every day | ORAL | Status: DC
  Administered 2015-06-20: 40 mg via ORAL
  Filled 2015-06-20 (×2): qty 1

## 2015-06-20 MED ORDER — TRAZODONE HCL 50 MG PO TABS: 50.0000 mg | ORAL_TABLET | Freq: Every evening | ORAL | Status: DC | PRN

## 2015-06-20 MED ORDER — CLOPIDOGREL BISULFATE 75 MG PO TABS
75.0000 mg | ORAL_TABLET | Freq: Every day | ORAL | Status: DC
  Administered 2015-06-20 – 2015-06-21 (×2): 75 mg via ORAL
  Filled 2015-06-20 (×2): qty 1

## 2015-06-20 MED ORDER — HEPARIN SODIUM (PORCINE) 5000 UNIT/ML IJ SOLN
5000.0000 [IU] | Freq: Three times a day (TID) | INTRAMUSCULAR | Status: DC
  Administered 2015-06-20 – 2015-06-21 (×3): 5000 [IU] via SUBCUTANEOUS
  Filled 2015-06-20 (×4): qty 1

## 2015-06-20 MED ORDER — CITALOPRAM HYDROBROMIDE 20 MG PO TABS
10.0000 mg | ORAL_TABLET | Freq: Every day | ORAL | Status: DC
  Administered 2015-06-20 – 2015-06-21 (×2): 10 mg via ORAL
  Filled 2015-06-20 (×2): qty 1

## 2015-06-20 MED ORDER — SODIUM CHLORIDE 0.9% FLUSH: 3.0000 mL | INTRAVENOUS | Status: DC | PRN

## 2015-06-20 MED ORDER — ASPIRIN EC 81 MG PO TBEC
81.0000 mg | DELAYED_RELEASE_TABLET | Freq: Every day | ORAL | Status: DC
  Administered 2015-06-20 – 2015-06-21 (×2): 81 mg via ORAL
  Filled 2015-06-20 (×2): qty 1

## 2015-06-20 MED ORDER — LISINOPRIL 5 MG PO TABS
5.0000 mg | ORAL_TABLET | Freq: Every day | ORAL | Status: DC
  Administered 2015-06-20 – 2015-06-21 (×2): 5 mg via ORAL
  Filled 2015-06-20 (×2): qty 1

## 2015-06-20 MED ORDER — CARVEDILOL 3.125 MG PO TABS
3.1250 mg | ORAL_TABLET | Freq: Two times a day (BID) | ORAL | Status: DC
  Administered 2015-06-20 – 2015-06-21 (×3): 3.125 mg via ORAL
  Filled 2015-06-20 (×3): qty 1

## 2015-06-20 MED ORDER — SILDENAFIL CITRATE 50 MG PO TABS: 50.0000 mg | ORAL_TABLET | Freq: Every day | ORAL | Status: DC | PRN

## 2015-06-20 NOTE — Progress Notes (Signed)
DAILY PROGRESS NOTE  Subjective:  No events overnight - no feverse. Breathing is about the same today. Only 300 ml urine recorded out, however, weight decreased 3 lbs overnight from 208 lbs. Last office weight in 04/09/2015 was 203 (now down to 205). Creatinine improved to 1.28 from 1.45. Seems that diuresis is working.  Objective:  Temp:  [98 F (36.7 C)-98.9 F (37.2 C)] 98 F (36.7 C) (04/27 0344) Pulse Rate:  [92-99] 92 (04/27 0344) Resp:  [16-27] 18 (04/27 0344) BP: (102-132)/(58-87) 113/67 mmHg (04/27 0344) SpO2:  [95 %-100 %] 98 % (04/27 0344) Weight:  [205 lb 8 oz (93.214 kg)-208 lb 11.2 oz (94.666 kg)] 205 lb 8 oz (93.214 kg) (04/27 0236) Weight change:   Intake/Output from previous day: 04/26 0701 - 04/27 0700 In: -  Out: 300 [Urine:300]  Intake/Output from this shift:    Medications: Current Facility-Administered Medications  Medication Dose Route Frequency Provider Last Rate Last Dose  . 0.9 %  sodium chloride infusion  250 mL Intravenous PRN Almyra Deforest, PA      . acetaminophen (TYLENOL) tablet 650 mg  650 mg Oral Q4H PRN Almyra Deforest, PA      . aspirin EC tablet 81 mg  81 mg Oral Daily [place], [place]   81 mg at 06/20/15 0926  . atorvastatin (LIPITOR) tablet 40 mg  40 mg Oral q1800 Almyra Deforest, [place]      . carvedilol (COREG) tablet 3.125 mg  3.125 mg Oral BID WC Almyra Deforest, PA   3.125 mg at 06/20/15 0925  . citalopram (CELEXA) tablet 10 mg  10 mg Oral Daily Almyra Deforest, [place]   10 mg at 06/20/15 0924  . clopidogrel (PLAVIX) tablet 75 mg  75 mg Oral Daily Almyra Deforest, [place]   75 mg at 06/20/15 0924  . furosemide (LASIX) injection 40 mg  40 mg Intravenous BID Almyra Deforest, PA   40 mg at 06/20/15 0923  . heparin injection 5,000 Units  5,000 Units Subcutaneous Q8H Almyra Deforest, PA   5,000 Units at 06/20/15 0550  . lisinopril (PRINIVIL,ZESTRIL) tablet 5 mg  5 mg Oral Daily Almyra Deforest, [place]   5 mg at 06/20/15 1700  . ondansetron (ZOFRAN) injection 4 mg  4 mg Intravenous Q6H PRN Almyra Deforest, PA      . sodium  chloride flush (NS) 0.9 % injection 3 mL  3 mL Intravenous Q12H Almyra Deforest, PA   3 mL at 06/20/15 0923  . sodium chloride flush (NS) 0.9 % injection 3 mL  3 mL Intravenous PRN Almyra Deforest, PA      . traZODone (DESYREL) tablet 50 mg  50 mg Oral QHS PRN Almyra Deforest, PA        Physical Exam: General appearance: alert and no distress Neck: no carotid bruit and no JVD Lungs: rales bibasilar Heart: regular rate and rhythm, S1, S2 normal, no murmur, click, rub or gallop Abdomen: soft, non-tender; bowel sounds normal; no masses,  no organomegaly and scaphoid Extremities: edema trace pedal Pulses: 2+ and symmetric Skin: Skin color, texture, turgor normal. No rashes or lesions Neurologic: Grossly normal  Lab Results: Results for orders placed or performed during the hospital encounter of 06/19/15 (from the past 48 hour(s))  Basic metabolic panel     Status: Abnormal   Collection Time: 06/19/15  3:26 PM  Result Value Ref Range   Sodium 134 (L) 135 - 145 mmol/L   Potassium 5.0 3.5 - 5.1 mmol/L  Comment: SPECIMEN HEMOLYZED. HEMOLYSIS MAY AFFECT INTEGRITY OF RESULTS.   Chloride 103 101 - 111 mmol/L   CO2 20 (L) 22 - 32 mmol/L   Glucose, Bld 92 65 - 99 mg/dL   BUN 27 (H) 6 - 20 mg/dL   Creatinine, Ser 1.45 (H) 0.61 - 1.24 mg/dL   Calcium 8.7 (L) 8.9 - 10.3 mg/dL   GFR calc non Af Amer 50 (L) >60 mL/min   GFR calc Af Amer 58 (L) >60 mL/min    Comment: (NOTE) The eGFR has been calculated using the CKD EPI equation. This calculation has not been validated in all clinical situations. eGFR's persistently <60 mL/min signify possible Chronic Kidney Disease.    Anion gap 11 5 - 15  CBC with Differential     Status: Abnormal   Collection Time: 06/19/15  3:26 PM  Result Value Ref Range   WBC 5.6 4.0 - 10.5 K/uL   RBC 3.77 (L) 4.22 - 5.81 MIL/uL   Hemoglobin 11.2 (L) 13.0 - 17.0 g/dL   HCT 34.6 (L) 39.0 - 52.0 %   MCV 91.8 78.0 - 100.0 fL   MCH 29.7 26.0 - 34.0 pg   MCHC 32.4 30.0 - 36.0 g/dL   RDW  14.8 11.5 - 15.5 %   Platelets 201 150 - 400 K/uL   Neutrophils Relative % 62 %   Neutro Abs 3.5 1.7 - 7.7 K/uL   Lymphocytes Relative 27 %   Lymphs Abs 1.5 0.7 - 4.0 K/uL   Monocytes Relative 10 %   Monocytes Absolute 0.6 0.1 - 1.0 K/uL   Eosinophils Relative 1 %   Eosinophils Absolute 0.1 0.0 - 0.7 K/uL   Basophils Relative 0 %   Basophils Absolute 0.0 0.0 - 0.1 K/uL  Brain natriuretic peptide     Status: Abnormal   Collection Time: 06/19/15  3:26 PM  Result Value Ref Range   B Natriuretic Peptide 1149.4 (H) 0.0 - 100.0 pg/mL  I-stat troponin, ED     Status: None   Collection Time: 06/19/15  3:34 PM  Result Value Ref Range   Troponin i, poc 0.02 0.00 - 0.08 ng/mL   Comment 3            Comment: Due to the release kinetics of cTnI, a negative result within the first hours of the onset of symptoms does not rule out myocardial infarction with certainty. If myocardial infarction is still suspected, repeat the test at appropriate intervals.   Basic metabolic panel     Status: Abnormal   Collection Time: 06/20/15  2:22 AM  Result Value Ref Range   Sodium 138 135 - 145 mmol/L   Potassium 3.9 3.5 - 5.1 mmol/L    Comment: DELTA CHECK NOTED   Chloride 105 101 - 111 mmol/L   CO2 23 22 - 32 mmol/L   Glucose, Bld 105 (H) 65 - 99 mg/dL   BUN 25 (H) 6 - 20 mg/dL   Creatinine, Ser 1.28 (H) 0.61 - 1.24 mg/dL   Calcium 8.8 (L) 8.9 - 10.3 mg/dL   GFR calc non Af Amer 58 (L) >60 mL/min   GFR calc Af Amer >60 >60 mL/min    Comment: (NOTE) The eGFR has been calculated using the CKD EPI equation. This calculation has not been validated in all clinical situations. eGFR's persistently <60 mL/min signify possible Chronic Kidney Disease.    Anion gap 10 5 - 15    Imaging: Dg Chest 2 View  06/19/2015  CLINICAL  DATA:  Chest pain and pressure. Shortness of breath. Coronary artery disease with multiple coronary stents. EXAM: CHEST  2 VIEW COMPARISON:  12/08/2014 FINDINGS: There is new slight  cardiomegaly. AICD in place. Pulmonary vascularity is normal. There is a new patchy area of infiltrate at the a left lung base posterior medially. The lungs are otherwise clear. No bone abnormality. IMPRESSION: Patchy new infiltrate at the left lung base posteriorly. Electronically Signed   By: Lorriane Shire M.D.   On: 06/19/2015 15:44    Assessment:  Principal Problem:   Acute systolic congestive heart failure, NYHA class 3 (HCC) Active Problems:   Cardiomyopathy, ischemic   Automatic implantable cardioverter-defibrillator in situ   Acute kidney insufficiency   DOE (dyspnea on exertion)   Plan:  1. He reports only minimal improvement in dyspnea with little urination, however, weight is down 3 lbs and creatinine has improved with diuresis. Continue IV diuresis today. Re-check 2 view CXR to evaluate for possible developing atypical pneumonia - he does have mild non-productive cough, but no fevers. Possible d/c tomorrow if there is improvement today. Encouraged ambulation.  Time Spent Directly with Patient:  15 minutes  Length of Stay:    Pixie Casino, MD, [facility] Attending Cardiologist [place] 06/20/2015, 9:31 AM

## 2015-06-21 ENCOUNTER — Telehealth: Payer: Self-pay | Admitting: Cardiology

## 2015-06-21 LAB — BASIC METABOLIC PANEL
Anion gap: 10 (ref 5–15)
BUN: 23 mg/dL — AB (ref 6–20)
CHLORIDE: 102 mmol/L (ref 101–111)
CO2: 27 mmol/L (ref 22–32)
CREATININE: 1.36 mg/dL — AB (ref 0.61–1.24)
Calcium: 8.7 mg/dL — ABNORMAL LOW (ref 8.9–10.3)
GFR calc Af Amer: >60 mL/min (ref >60)
GFR, EST NON AFRICAN AMERICAN: 54 mL/min — AB (ref >60)
GLUCOSE: 120 mg/dL — AB (ref 65–99)
POTASSIUM: 3.4 mmol/L — AB (ref 3.5–5.1)
SODIUM: 139 mmol/L (ref 135–145)

## 2015-06-21 MED ORDER — LISINOPRIL 5 MG PO TABS: 5.0000 mg | ORAL_TABLET | Freq: Every day | ORAL | Status: DC

## 2015-06-21 MED ORDER — POTASSIUM CHLORIDE CRYS ER 20 MEQ PO TBCR
40.0000 meq | EXTENDED_RELEASE_TABLET | Freq: Once | ORAL | Status: AC
  Administered 2015-06-21: 40 meq via ORAL
  Filled 2015-06-21: qty 2

## 2015-06-21 MED ORDER — FUROSEMIDE 40 MG PO TABS
40.0000 mg | ORAL_TABLET | Freq: Every day | ORAL | Status: DC
  Administered 2015-06-21: 40 mg via ORAL
  Filled 2015-06-21: qty 1

## 2015-06-21 MED ORDER — FUROSEMIDE 40 MG PO TABS: 40.0000 mg | ORAL_TABLET | Freq: Every day | ORAL | Status: DC

## 2015-06-21 NOTE — Progress Notes (Signed)
Discharged to home with family office visits in place teaching done  

## 2015-06-21 NOTE — Telephone Encounter (Signed)
TCM w/ [name]- 5/6 @ 220 pm   Per [name]

## 2015-06-21 NOTE — Progress Notes (Signed)
DAILY PROGRESS NOTE  Subjective:  Feels much better today. Creatinine stable - mild hypokalemia today. He is about 5L negative since admission. Weight now down to 200 lb. This may be his new dry weight. Repeat CXR shows little change in LLL opacity - possible atelactasis - doubt pneumonia. No fever or leukocytosis.   Objective:  Temp:  [97.6 F (36.4 C)-98.2 F (36.8 C)] 97.7 F (36.5 C) (04/28 0516) Pulse Rate:  [84-87] 84 (04/28 0516) Resp:  [18] 18 (04/28 0516) BP: (96-111)/(62-65) 111/65 mmHg (04/28 0516) SpO2:  [98 %-100 %] 99 % (04/28 0516) Weight:  [200 lb 14.4 oz (91.128 kg)] 200 lb 14.4 oz (91.128 kg) (04/28 0516) Weight change: -7 lb 12.8 oz (-3.538 kg)  Intake/Output from previous day: 04/27 0701 - 04/28 0700 In: 240 [P.O.:240] Out: 4850 [Urine:4850]  Intake/Output from this shift:    Medications: Current Facility-Administered Medications  Medication Dose Route Frequency Provider Last Rate Last Dose  . 0.9 %  sodium chloride infusion  250 mL Intravenous PRN Almyra Deforest, PA      . acetaminophen (TYLENOL) tablet 650 mg  650 mg Oral Q4H PRN Almyra Deforest, PA      . aspirin EC tablet 81 mg  81 mg Oral Daily [place], [place]   81 mg at 06/20/15 0926  . atorvastatin (LIPITOR) tablet 40 mg  40 mg Oral q1800 Almyra Deforest, PA   40 mg at 06/20/15 1724  . carvedilol (COREG) tablet 3.125 mg  3.125 mg Oral BID WC Almyra Deforest, PA   3.125 mg at 06/21/15 1448  . citalopram (CELEXA) tablet 10 mg  10 mg Oral Daily Almyra Deforest, [place]   10 mg at 06/20/15 0924  . clopidogrel (PLAVIX) tablet 75 mg  75 mg Oral Daily Almyra Deforest, [place]   75 mg at 06/20/15 0924  . furosemide (LASIX) injection 40 mg  40 mg Intravenous BID Almyra Deforest, PA   40 mg at 06/20/15 1725  . heparin injection 5,000 Units  5,000 Units Subcutaneous Q8H Almyra Deforest, [place]   5,000 Units at 06/21/15 1856  . lisinopril (PRINIVIL,ZESTRIL) tablet 5 mg  5 mg Oral Daily Almyra Deforest, [place]   5 mg at 06/20/15 3149  . ondansetron (ZOFRAN) injection 4 mg  4 mg Intravenous  Q6H PRN Almyra Deforest, PA      . sodium chloride flush (NS) 0.9 % injection 3 mL  3 mL Intravenous Q12H Almyra Deforest, PA   3 mL at 06/20/15 2147  . sodium chloride flush (NS) 0.9 % injection 3 mL  3 mL Intravenous PRN Almyra Deforest, PA      . traZODone (DESYREL) tablet 50 mg  50 mg Oral QHS PRN Almyra Deforest, PA        Physical Exam: General appearance: alert and no distress Neck: no carotid bruit and no JVD Lungs: clear to auscultation bilaterally Heart: regular rate and rhythm, S1, S2 normal, no murmur, click, rub or gallop Abdomen: soft, non-tender; bowel sounds normal; no masses,  no organomegaly Extremities: extremities normal, atraumatic, no cyanosis or edema Pulses: 2+ and symmetric Skin: Skin color, texture, turgor normal. No rashes or lesions Neurologic: Grossly normal  Lab Results: Results for orders placed or performed during the hospital encounter of 06/19/15 (from the past 48 hour(s))  Basic metabolic panel     Status: Abnormal   Collection Time: 06/19/15  3:26 PM  Result Value Ref Range   Sodium 134 (L) 135 - 145 mmol/L   Potassium 5.0  3.5 - 5.1 mmol/L    Comment: SPECIMEN HEMOLYZED. HEMOLYSIS MAY AFFECT INTEGRITY OF RESULTS.   Chloride 103 101 - 111 mmol/L   CO2 20 (L) 22 - 32 mmol/L   Glucose, Bld 92 65 - 99 mg/dL   BUN 27 (H) 6 - 20 mg/dL   Creatinine, Ser 1.45 (H) 0.61 - 1.24 mg/dL   Calcium 8.7 (L) 8.9 - 10.3 mg/dL   GFR calc non Af Amer 50 (L) >60 mL/min   GFR calc Af Amer 58 (L) >60 mL/min    Comment: (NOTE) The eGFR has been calculated using the CKD EPI equation. This calculation has not been validated in all clinical situations. eGFR's persistently <60 mL/min signify possible Chronic Kidney Disease.    Anion gap 11 5 - 15  CBC with Differential     Status: Abnormal   Collection Time: 06/19/15  3:26 PM  Result Value Ref Range   WBC 5.6 4.0 - 10.5 K/uL   RBC 3.77 (L) 4.22 - 5.81 MIL/uL   Hemoglobin 11.2 (L) 13.0 - 17.0 g/dL   HCT 34.6 (L) 39.0 - 52.0 %   MCV 91.8 78.0  - 100.0 fL   MCH 29.7 26.0 - 34.0 pg   MCHC 32.4 30.0 - 36.0 g/dL   RDW 14.8 11.5 - 15.5 %   Platelets 201 150 - 400 K/uL   Neutrophils Relative % 62 %   Neutro Abs 3.5 1.7 - 7.7 K/uL   Lymphocytes Relative 27 %   Lymphs Abs 1.5 0.7 - 4.0 K/uL   Monocytes Relative 10 %   Monocytes Absolute 0.6 0.1 - 1.0 K/uL   Eosinophils Relative 1 %   Eosinophils Absolute 0.1 0.0 - 0.7 K/uL   Basophils Relative 0 %   Basophils Absolute 0.0 0.0 - 0.1 K/uL  Brain natriuretic peptide     Status: Abnormal   Collection Time: 06/19/15  3:26 PM  Result Value Ref Range   B Natriuretic Peptide 1149.4 (H) 0.0 - 100.0 pg/mL  I-stat troponin, ED     Status: None   Collection Time: 06/19/15  3:34 PM  Result Value Ref Range   Troponin i, poc 0.02 0.00 - 0.08 ng/mL   Comment 3            Comment: Due to the release kinetics of cTnI, a negative result within the first hours of the onset of symptoms does not rule out myocardial infarction with certainty. If myocardial infarction is still suspected, repeat the test at appropriate intervals.   Basic metabolic panel     Status: Abnormal   Collection Time: 06/20/15  2:22 AM  Result Value Ref Range   Sodium 138 135 - 145 mmol/L   Potassium 3.9 3.5 - 5.1 mmol/L    Comment: DELTA CHECK NOTED   Chloride 105 101 - 111 mmol/L   CO2 23 22 - 32 mmol/L   Glucose, Bld 105 (H) 65 - 99 mg/dL   BUN 25 (H) 6 - 20 mg/dL   Creatinine, Ser 1.28 (H) 0.61 - 1.24 mg/dL   Calcium 8.8 (L) 8.9 - 10.3 mg/dL   GFR calc non Af Amer 58 (L) >60 mL/min   GFR calc Af Amer >60 >60 mL/min    Comment: (NOTE) The eGFR has been calculated using the CKD EPI equation. This calculation has not been validated in all clinical situations. eGFR's persistently <60 mL/min signify possible Chronic Kidney Disease.    Anion gap 10 5 - 15  Basic metabolic panel  Status: Abnormal   Collection Time: 06/21/15  3:34 AM  Result Value Ref Range   Sodium 139 135 - 145 mmol/L   Potassium 3.4 (L)  3.5 - 5.1 mmol/L   Chloride 102 101 - 111 mmol/L   CO2 27 22 - 32 mmol/L   Glucose, Bld 120 (H) 65 - 99 mg/dL   BUN 23 (H) 6 - 20 mg/dL   Creatinine, Ser 1.36 (H) 0.61 - 1.24 mg/dL   Calcium 8.7 (L) 8.9 - 10.3 mg/dL   GFR calc non Af Amer 54 (L) >60 mL/min   GFR calc Af Amer >60 >60 mL/min    Comment: (NOTE) The eGFR has been calculated using the CKD EPI equation. This calculation has not been validated in all clinical situations. eGFR's persistently <60 mL/min signify possible Chronic Kidney Disease.    Anion gap 10 5 - 15    Imaging: Dg Chest 2 View  06/20/2015  CLINICAL DATA:  Shortness of breath, weakness, cough EXAM: CHEST  2 VIEW COMPARISON:  06/19/2015 FINDINGS: Mild patchy left lower lobe opacity, atelectasis versus pneumonia. No pleural effusion or pneumothorax. Cardiomegaly.  Left subclavian ICD. Visualized osseous structures are within normal limits. IMPRESSION: Mild patchy left lower lobe opacity, atelectasis versus pneumonia. Electronically Signed   By: Julian Hy M.D.   On: 06/20/2015 10:49   Dg Chest 2 View  06/19/2015  CLINICAL DATA:  Chest pain and pressure. Shortness of breath. Coronary artery disease with multiple coronary stents. EXAM: CHEST  2 VIEW COMPARISON:  12/08/2014 FINDINGS: There is new slight cardiomegaly. AICD in place. Pulmonary vascularity is normal. There is a new patchy area of infiltrate at the a left lung base posterior medially. The lungs are otherwise clear. No bone abnormality. IMPRESSION: Patchy new infiltrate at the left lung base posteriorly. Electronically Signed   By: Lorriane Shire M.D.   On: 06/19/2015 15:44    Assessment:  Principal Problem:   Acute systolic congestive heart failure, NYHA class 3 (HCC) Active Problems:   Cardiomyopathy, ischemic   Automatic implantable cardioverter-defibrillator in situ   Acute kidney insufficiency   DOE (dyspnea on exertion)   Plan:  1. Breathing is back to baseline. New dry weight is 200  lbs (he is down 8 lbs -5L since admission). Replete potassium today in the setting of aggressive diuresis. D/c IV lasix. Restart home lasix, but increase dose to 40 mg po daily. [place] for d/c home today. Follow-up with Dr. Stanford Breed of [facility] in 5-7 days (TOC).  Time Spent Directly with Patient:  15 minutes  Length of Stay:  LOS: 1 day   Pixie Casino, MD, [facility] Attending Cardiologist [place] 06/21/2015, 9:55 AM

## 2015-06-21 NOTE — Telephone Encounter (Signed)
Correction-  TCM w/ [name] 5/4 @ 330 Per [name]

## 2015-06-21 NOTE — Discharge Summary (Signed)
Discharge Summary    Patient ID: [name],  MRN: [id number], DOB/AGE: 1955-04-12 60 y.o.  Admit date: 06/19/2015 Discharge date: 06/21/2015  Primary Care Provider: Pcp Not In System Primary Cardiologist: Dr. Jens Som  Discharge Diagnoses    Principal Problem:   Acute systolic congestive heart failure, NYHA class 3 (HCC) Active Problems:   Cardiomyopathy, ischemic   Automatic implantable cardioverter-defibrillator in situ   Acute kidney insufficiency   DOE (dyspnea on exertion)   Allergies No Known Allergies  Diagnostic Studies/Procedures    None   History of Present Illness     [name] is a 60 year old male with PMH of CAD s/p multiple PCI, ICM with baseline EF 15% percent on last echo 11/2014, HTN, HLD, chronic systolic heart failure, and h/o VT s/p Medtronic ICD presented with progressive DOE and 1 episode chest pressure  Hospital Course     [name] is a 60 year old male with PMH of CAD s/p multiple PCI, ICM with baseline EF 15% percent on last echo 11/2014, HTN, HLD, chronic systolic heart failure, and h/o VT s/p Medtronic ICD.   He presented to [facility] ED on 06/19/2015 after walking to his house from the garage and noticing that he walked about 70 feet becoming very short of breath. He is chronically SOB but this was an abnormal occurrence for him, so he called 911. While waiting for EMS he did have a brief episode of chest pain. On arrival to [facility] ED, significant laboratory finding include sodium 134, creatinine of 1.45 whereas his baseline creatinine is 1.07, BNP 1149, troponin 0.02, hemoglobin 11.2. EKG showed normal sinus rhythm, right bundle branch block, nonspecific ST-T wave changes. Chest x-ray showed patchy infiltrate in the left lung base posteriorly. He was prophylactically treated with antibiotic in case of pneumonia. Cardiology was called for admission for acute systolic congestive heart failure and IV diuresis. Of note he was noncomplaint with  home medications prior to ED admission and was started back on home medications, with orders to trend cardiac troponins.   Admission weight trended from 208>>200 with net output -4910 over the course of 3 day admission. Cr improved from 1.45>>1.36, cardiac troponins were negative. Repeat chest x-ray showed little change in LLL opacity- most likely atelectasis. Remained without fever or leukocytosis. Patients breathing improved and feeling much better on day of discharge.   Pt is stable for discharge after being seen by Dr. Rennis Golden _____________  Discharge Vitals Blood pressure 111/65, pulse 84, temperature 97.7 F (36.5 C), temperature source Oral, resp. rate 18, height  (1.803 m), weight 200 lb 14.4 oz (91.128 kg), SpO2 99 %.  Filed Weights   06/19/15 2025 06/20/15 0236 06/21/15 0516  Weight: 208 lb 11.2 oz (94.666 kg) 205 lb 8 oz (93.214 kg) 200 lb 14.4 oz (91.128 kg)    Labs & Radiologic Studies    CBC  Recent Labs  06/19/15 1526  WBC 5.6  NEUTROABS 3.5  HGB 11.2*  HCT 34.6*  MCV 91.8  PLT 201   Basic Metabolic Panel  Recent Labs  06/20/15 0222 06/21/15 0334  NA 138 139  K 3.9 3.4*  CL 105 102  CO2 23 27  GLUCOSE 105* 120*  BUN 25* 23*  CREATININE 1.28* 1.36*  CALCIUM 8.8* 8.7*   _____________  Dg Chest 2 View  06/20/2015  CLINICAL DATA:  Shortness of breath, weakness, cough EXAM: CHEST  2 VIEW COMPARISON:  06/19/2015 FINDINGS: Mild patchy left lower lobe opacity, atelectasis  versus pneumonia. No pleural effusion or pneumothorax. Cardiomegaly.  Left subclavian ICD. Visualized osseous structures are within normal limits. IMPRESSION: Mild patchy left lower lobe opacity, atelectasis versus pneumonia. Electronically Signed   By: Charline BillsSriyesh  Krishnan M.D.   On: 06/20/2015 10:49   Dg Chest 2 View  06/19/2015  CLINICAL DATA:  Chest pain and pressure. Shortness of breath. Coronary artery disease with multiple coronary stents. EXAM: CHEST  2 VIEW COMPARISON:  12/08/2014  FINDINGS: There is new slight cardiomegaly. AICD in place. Pulmonary vascularity is normal. There is a new patchy area of infiltrate at the a left lung base posterior medially. The lungs are otherwise clear. No bone abnormality. IMPRESSION: Patchy new infiltrate at the left lung base posteriorly. Electronically Signed   By: Francene BoyersJames  Maxwell M.D.   On: 06/19/2015 15:44   Disposition   Pt is being discharged home today in good condition.  Follow-up Plans & Appointments    Follow-up Information    Follow up with Theodore DemarkBarrett, [name], PA-C On 06/27/2015.   Specialties:  Cardiology, Radiology   Why:  3:30pm Dr. Ludwig Clarksrenshaw's PA   Contact information:   [address] [place] [address] [phone]        Discharge Medications   Current Discharge Medication List    CONTINUE these medications which have CHANGED   Details  furosemide (LASIX) 40 MG tablet Take 1 tablet (40 mg total) by mouth daily. Qty: 30 tablet, Refills: 6    lisinopril (PRINIVIL,ZESTRIL) 5 MG tablet Take 1 tablet (5 mg total) by mouth daily. Qty: 30 tablet, Refills: 6      CONTINUE these medications which have NOT CHANGED   Details  atorvastatin (LIPITOR) 40 MG tablet Take 1 tablet (40 mg total) by mouth daily at 6 PM. Qty: 30 tablet, Refills: 11   Associated Diagnoses: SOB (shortness of breath)    carvedilol (COREG) 3.125 MG tablet Take 1 tablet (3.125 mg total) by mouth 2 (two) times daily with a meal. Qty: 60 tablet, Refills: 11   Associated Diagnoses: SOB (shortness of breath)    citalopram (CELEXA) 10 MG tablet Take 10 mg by mouth daily.    clopidogrel (PLAVIX) 75 MG tablet Take 1 tablet (75 mg total) by mouth daily. Take one table by mouth everyday with breakfast. Qty: 30 tablet, Refills: 11   Associated Diagnoses: SOB (shortness of breath)    nitroGLYCERIN (NITROSTAT) 0.4 MG SL tablet Place 1 tablet (0.4 mg total) under the tongue every 5 (five) minutes as needed. For chest pain Qty: 25 tablet,  Refills: 11   Associated Diagnoses: SOB (shortness of breath)    sildenafil (VIAGRA) 50 MG tablet Take 1 tablet (50 mg total) by mouth daily as needed for erectile dysfunction. Qty: 15 tablet, Refills: 3    traZODone (DESYREL) 50 MG tablet Take 1 tablet (50 mg total) by mouth at bedtime as needed for sleep. Qty: 30 tablet, Refills: 1    aspirin EC 81 MG tablet Take 81 mg by mouth daily. Reported on 06/19/2015    sertraline (ZOLOFT) 50 MG tablet Take 1.5 tablets (75 mg total) by mouth daily. Qty: 45 tablet, Refills: 11           Outstanding Labs/Studies   Consider outpatient BMET  Duration of Discharge Encounter   Greater than 30 minutes including physician time.  Signed, Laverda PageLindsay Roberts NP-C 06/21/2015, 11:08 AM

## 2015-06-24 NOTE — Telephone Encounter (Signed)
[facility]  Patient understands to follow up with provider Theodore Demarkhonda Barrett on 06/26/13 @ 3:00 [facility] Patient understands discharge instructions? ye Patient understands medications and regiment? Yes. Patient states that he will discuss his medications with Bjorn Loserhonda at his visit. Patient understands to bring all medications to this visit? yes

## 2015-06-27 ENCOUNTER — Ambulatory Visit (INDEPENDENT_AMBULATORY_CARE_PROVIDER_SITE_OTHER): Payer: Self-pay | Admitting: Physician Assistant

## 2015-06-27 ENCOUNTER — Encounter: Payer: Self-pay | Admitting: Physician Assistant

## 2015-06-27 VITALS — BP 120/73 | HR 106 | Ht 71.0 in | Wt 201.6 lb

## 2015-06-27 DIAGNOSIS — Z79899 Other long term (current) drug therapy: Secondary | ICD-10-CM

## 2015-06-27 DIAGNOSIS — R0602 Shortness of breath: Secondary | ICD-10-CM

## 2015-06-27 DIAGNOSIS — I5022 Chronic systolic (congestive) heart failure: Secondary | ICD-10-CM

## 2015-06-27 MED ORDER — CLOTRIMAZOLE-BETAMETHASONE 1-0.05 % EX CREA: 1.0000 "application " | TOPICAL_CREAM | Freq: Two times a day (BID) | CUTANEOUS | Status: DC

## 2015-06-27 NOTE — Patient Instructions (Signed)
Weigh daily Call [phone] if weight climbs more than 3 pounds in a day or 5 pounds in a week. No salt to very little salt in your diet.  No more than 2000 mg in a day. Call if increased shortness of breath or increased swelling.   Your physician recommends that you return for lab work TODAY.  Your physician recommends that you schedule a follow-up appointment in: 3 months with Dr Jens Somrenshaw.  A prescription for Lotrisone cream has been sent to your pharmacy.

## 2015-06-27 NOTE — Progress Notes (Signed)
Cardiology Office Note   Date:  06/27/2015   ID:  [name] LuriaLewis R [name], DOB 09/13/1955, MRN [id number]  PCP:  Pcp Not In System  Cardiologist:  Dr Jens Somrenshaw, Dr Lorenso QuarryKlein  Barrett, [name], PA-C   Chief Complaint  Patient presents with  . Follow-up    yes shortness of breath, yes light headedness and dizziness, no edema, no pain or cramping in legs, no chest pain, weakness and soreness in neck and jaws,     History of Present Illness: [name] LuriaLewis R [name] is a 60 y.o. male with a history of CAD s/p multiple PCI, ICM with baseline EF 15% percent on last echo 11/2014, HTN, HLD, chronic systolic heart failure, and h/o VT s/p Medtronic ICD.  D/c 04/28 after admission for progressive DOE and 1 episode chest pressure. Wt at dc 200 lbs  [name] presents for Post hospital follow-up  Since discharge from the hospital, he has done very well. His weight on his home scales is fluctuated between 191 and 193 pounds. He is trying to be compliant with a low-sodium diet. He drinks a half gallon of water daily at work and then drinks tea or soda when he gets home. He is compliant with his medications.  He does not want to go back to the hospital. He wants to know what he can do to prevent going back.  He has not had chest pain. He had one episode where he had bilateral jaw pain and pain on both sides of the back of his neck. He was slightly short of breath with this. It was nonexertional. It resolved in a few minutes. He has never had this before and has not had it since. He recognizes how difficult it would be to figure out what this was.  He bought a [employer] and has been riding. This gives him true joy. He has wanted alcohol but has not had any.  He has an eczema lesion on his ankle that is bothering him right now and requests Lotrisone cream for it since he does not have a primary care physician.   Past Medical History  Diagnosis Date  . HTN (hypertension)   . HLD (hyperlipidemia) mixed  . Ischemic  cardiomyopathy     a.  EF 30-35% 2010;  b.  EF 20-25% by LV gram 08/2012. c. EF 15% by echo 11/2014.  [name]. CAD (coronary artery disease)     a.  Severe LAD stenosis 2/2 acute thrombus - BMS 2009;  b. 06/01/11 Cath - LAD 20 isr, 7621m (3.0x16 Veri-flex BMS), OM1 100p, EF 20-25%;  c. 07/2012 Abnl Cardiolite;  d. 08/2012 Cath/PCI: LM nl, LAD patent stents, D2 80-90 (jailed), LCX 90 p/m (4.0x24 Promus Premier DES), OM1 100, OM2 80-90p (3.0x20 Promus Premier DES), RCA patent mid stent, 40d, EF 25%.  . Anxiety   . Paroxysmal ventricular tachycardia (HCC)     a. VFlutter  CL 210 msec  Rx shock 04/2011  . Chronic systolic CHF (congestive heart failure) (HCC)   . Automatic implantable cardioverter-defibrillator in situ     a. Medtronic ICD.  [name]. History of rheumatic fever 1962  . Myocardial infarction [facility](HCC) 1998; 2002; ~ 2010  . Depression   . TIA (transient ischemic attack)     a. Dx 11/2014, continued on ASA/Plavix.  [name]. PVC's (premature ventricular contractions)   . RBBB     Past Surgical History  Procedure Laterality Date  . Defibrillator implantation  04/12/02    primary prevention for sudden  death; Dr. Graciela Husbands   . Cardiac defibrillator placement      primary prevention for sudden death; Dr. Graciela Husbands   . Cardiac catheterization      "lots of those" (08/29/2012)  . Coronary angioplasty with stent placement  2002-08/29/2012    "think I had 4 total then 2 today" to LAD 06/01/2011/notes (08/29/2012)  . Laparoscopic cholecystectomy    . Hernia repair      "w/gallbladder OR" (08/29/2012)  . Percutaneous coronary stent intervention (pci-s) N/A 06/01/2011    Procedure: PERCUTANEOUS CORONARY STENT INTERVENTION (PCI-S);  Surgeon: Peter M Swaziland, MD;  Location: [facility] CATH LAB;  Service: Cardiovascular;  Laterality: N/A;  . Left heart catheterization with coronary angiogram N/A 06/08/2011    Procedure: LEFT HEART CATHETERIZATION WITH CORONARY ANGIOGRAM;  Surgeon: Dolores Patty, MD;  Location: [facility] CATH LAB;  Service:  Cardiovascular;  Laterality: N/A;  . Percutaneous coronary stent intervention (pci-s) N/A 08/29/2012    Procedure: PERCUTANEOUS CORONARY STENT INTERVENTION (PCI-S);  Surgeon: Peter M Swaziland, MD;  Location: [facility] CATH LAB;  Service: Cardiovascular;  Laterality: N/A;    Current Outpatient Prescriptions  Medication Sig Dispense Refill  . aspirin EC 81 MG tablet Take 81 mg by mouth daily. Reported on 06/19/2015    . atorvastatin (LIPITOR) 40 MG tablet Take 1 tablet (40 mg total) by mouth daily at 6 PM. 30 tablet 11  . carvedilol (COREG) 3.125 MG tablet Take 1 tablet (3.125 mg total) by mouth 2 (two) times daily with a meal. 60 tablet 11  . citalopram (CELEXA) 10 MG tablet Take 10 mg by mouth daily.    . clopidogrel (PLAVIX) 75 MG tablet Take 1 tablet (75 mg total) by mouth daily. Take one table by mouth everyday with breakfast. 30 tablet 11  . furosemide (LASIX) 40 MG tablet Take 1 tablet (40 mg total) by mouth daily. 30 tablet 6  . lisinopril (PRINIVIL,ZESTRIL) 5 MG tablet Take 1 tablet (5 mg total) by mouth daily. 30 tablet 6  . nitroGLYCERIN (NITROSTAT) 0.4 MG SL tablet Place 1 tablet (0.4 mg total) under the tongue every 5 (five) minutes as needed. For chest pain 25 tablet 11  . sildenafil (VIAGRA) 50 MG tablet Take 1 tablet (50 mg total) by mouth daily as needed for erectile dysfunction. 15 tablet 3  . traZODone (DESYREL) 50 MG tablet Take 1 tablet (50 mg total) by mouth at bedtime as needed for sleep. 30 tablet 1  . clotrimazole-betamethasone (LOTRISONE) cream Apply 1 application topically 2 (two) times daily. 15 g 0  . sertraline (ZOLOFT) 50 MG tablet Take 1.5 tablets (75 mg total) by mouth daily. (Patient not taking: Reported on 06/27/2015) 45 tablet 11   No current facility-administered medications for this visit.    Allergies:   Review of patient's allergies indicates no known allergies.    Social History:  The patient  reports that he has been smoking Cigarettes.  He has a 22 pack-year  smoking history. He has never used smokeless tobacco. He reports that he does not drink alcohol or use illicit drugs.   Family History:  The patient's family history includes Cancer in his father and sister; Hypertension in his brother. There is no history of Heart attack or Stroke.    ROS:  Please see the history of present illness. All other systems are reviewed and negative.    PHYSICAL EXAM: VS:  BP 120/73 mmHg  Pulse 106  Ht 5\' 11"  (1.803 m)  Wt 201 lb 9.6 oz (91.445 kg)  BMI 28.13 kg/m2 , BMI Body mass index is 28.13 kg/(m^2). GEN: Well nourished, well developed, male in no acute distress HEENT: normal for age  Neck: no JVD, no carotid bruit, no masses Cardiac: RRR; soft murmur, no rubs, or gallops Respiratory:  Few rales bases bilaterally, normal work of breathing GI: soft, nontender, nondistended, + BS MS: no deformity or atrophy; no edema; distal pulses are 2+ in all 4 extremities; approximately 3 cm lesion on the inner aspect of his right ankle was visualized. It is not draining and does not appear infected. Skin: warm and dry, no rash Neuro:  Strength and sensation are intact Psych: euthymic mood, full affect   EKG:  EKG is not ordered today.   Recent Labs: 12/11/2014: ALT 16*; Magnesium 2.0; TSH 1.739 06/19/2015: B Natriuretic Peptide 1149.4*; Hemoglobin 11.2*; Platelets 201 06/21/2015: BUN 23*; Creatinine, Ser 1.36*; Potassium 3.4*; Sodium 139    Lipid Panel    Component Value Date/Time   CHOL 183 12/09/2014 0310   TRIG 61 12/09/2014 0310   HDL 41 12/09/2014 0310   CHOLHDL 4.5 12/09/2014 0310   VLDL 12 12/09/2014 0310   LDLCALC 130* 12/09/2014 0310   LDLDIRECT 159.2 02/04/2010 1622     Wt Readings from Last 3 Encounters:  06/27/15 201 lb 9.6 oz (91.445 kg)  06/21/15 200 lb 14.4 oz (91.128 kg)  04/09/15 203 lb (92.08 kg)     Other studies Reviewed: Additional studies/ records that were reviewed today include: Previous office notes, hospital records and  testing  ASSESSMENT AND PLAN:  1.  Chronic systolic CHF: The importance of compliance with diet and medications was emphasized. He is to continue daily weights. He is positively reinforced by the fact that he feels better now. His potassium was slightly low at discharge, we will check a BMET today.  His blood pressure is well-controlled. His heart rate is elevated, but he is not lightheaded and does not think he is dry. Continue current therapy for now.  He was requested to limit his fluids to 1.5 L daily when at work. He can drink other liquids when he comes home. He feels he will be able to do this. He understands that during hot weather, he may need to increase this. The importance of being sensitive to changes in his daily weights and symptoms, to be aware if his volume status is elevated or not, was emphasized.  2. CAD: He is having no ischemic symptoms. He is on aspirin, Lipitor, carvedilol, Plavix and lisinopril. Continue current therapy.   Current medicines are reviewed at length with the patient today.  The patient does not have concerns regarding medicines.  The following changes have been made:  no change  Labs/ tests ordered today include: \ Orders Placed This Encounter  Procedures  . Basic metabolic panel     Disposition:   FU with Dr. Jens Som and Dr. Graciela Husbands as scheduled  Signed, Leanna Battles  06/27/2015 5:03 PM    [facility] Phone: [phone]; Fax: [phone]  This note was written with the assistance of speech recognition software. Please excuse any transcriptional errors.

## 2015-06-28 LAB — BASIC METABOLIC PANEL
BUN: 20 mg/dL (ref 7–25)
CALCIUM: 9 mg/dL (ref 8.6–10.3)
CO2: 27 mmol/L (ref 20–31)
Chloride: 102 mmol/L (ref 98–110)
Creat: 0.9 mg/dL (ref 0.70–1.25)
Glucose, Bld: 89 mg/dL (ref 65–99)
Potassium: 4.9 mmol/L (ref 3.5–5.3)
SODIUM: 137 mmol/L (ref 135–146)

## 2015-07-01 ENCOUNTER — Ambulatory Visit: Payer: Self-pay | Admitting: Cardiology

## 2015-07-04 ENCOUNTER — Other Ambulatory Visit: Payer: Self-pay

## 2015-07-05 ENCOUNTER — Telehealth: Payer: Self-pay | Admitting: Cardiology

## 2015-07-05 DIAGNOSIS — R0602 Shortness of breath: Secondary | ICD-10-CM

## 2015-07-05 MED ORDER — FUROSEMIDE 40 MG PO TABS: 40.0000 mg | ORAL_TABLET | Freq: Every day | ORAL | Status: DC

## 2015-07-05 NOTE — Telephone Encounter (Signed)
Pt called c/o SOB with and without exertion. He has no c/o of chest pain/pressure, swelling, lightheadness, or weight gain. Pt stated that he is able to lay in bed at night and complete his ADL's. Pt stated he felt better when he first got out of the hospital but over the past few days he dose not feel like it is getting much better. Pt has been compliant with his medications. Pt states the episodes of SOB last a few minutes when they come and are happening more frequently.  Pt currently takes 40 mg of Lasix QD.  Review pt concerns with DOD, Hilty, pt labs within normal limits from past readings, pt to increase Lasix to 80 MG daily for next 3 days and then have BMET/BNP early  Next week and OV is needed pending results.  Pt aware and agrees with results, no additional questions at this times. Pt appt setup at [address] office for 5/16 and new Rx of Lasix sent to pt perferred pharmacy as last RX was sent to wrong pharmacy so pt only has 20 MG tablets on hand.

## 2015-07-05 NOTE — Telephone Encounter (Signed)
[name] is calling because he is still having trouble breathing and is asking what else can he do about this ? Please call  Thanks

## 2015-07-09 ENCOUNTER — Other Ambulatory Visit: Payer: Self-pay

## 2015-07-10 LAB — BRAIN NATRIURETIC PEPTIDE: Brain Natriuretic Peptide: 324.1 pg/mL — ABNORMAL HIGH (ref <100)

## 2015-07-11 ENCOUNTER — Telehealth: Payer: Self-pay | Admitting: Cardiology

## 2015-07-11 NOTE — Telephone Encounter (Signed)
Pt had labs drawn 5/16 and was to have BMET and BNP.  BNP drawn but BMET wasn't. Called pt to have him come in for BMET.  Left message to call back. BNP result states pt needs PA OV. Will try pt again tomorrow.

## 2015-07-12 NOTE — Telephone Encounter (Signed)
Left message to call back  

## 2015-07-12 NOTE — Telephone Encounter (Signed)
Spoke with pt and reviewed BNP results with him. Scheduled pt to see Bary CastillaKaty Thompson, PA-C on 5/25 at 11:30AM and he will have BMET that was missed, drawn at that time. Pt verbalized understanding and was in agreement with this plan.

## 2015-07-16 ENCOUNTER — Ambulatory Visit: Payer: Self-pay | Admitting: Cardiology

## 2015-07-17 ENCOUNTER — Telehealth: Payer: Self-pay

## 2015-07-17 ENCOUNTER — Ambulatory Visit (INDEPENDENT_AMBULATORY_CARE_PROVIDER_SITE_OTHER): Payer: Self-pay | Admitting: *Deleted

## 2015-07-17 DIAGNOSIS — I255 Ischemic cardiomyopathy: Secondary | ICD-10-CM

## 2015-07-17 NOTE — Telephone Encounter (Signed)
Call to patient.  Explained [name] CrockKathryn Thompson, PA asked if he would send a remote transmission to check fluid levels on his device.   Explained she will review the transmission at his appointment scheduled for tomorrow.  He stated he knows how to send a transmission and will send one this evening.  Advised if not received prior to his appointment will call him.

## 2015-07-17 NOTE — Progress Notes (Signed)
Cardiology Office Note    Date:  07/18/2015   ID:  [name], DOB [birth year], MRN [id number]  PCP:  Pcp Not In System  Cardiologist:  Dr. Jens Som / Dr Graciela Husbands (EP)  Elevated BNP  History of Present Illness:  [name] is a 60 y.o. male with a history of CAD s/p multiple PCIs, ICM (EF 15%)/chronic systolic CHF, HTN, HLD, TIA, tobacco abuse and h/o VT s/p Medtronic ICD who presents to clinic for evaluation of SOB and elevated BNP.  Cardiac catheterization July 2014 showed a normal left main, patent stents in the LAD with an 80-90% second diagonal (jailed), 90% proximal circumflex, occluded first obtuse marginal, 80-90% second obtuse marginal and ejection fraction 25%. Patient had PCI of his circumflex and second obtuse marginal at that time. He was admitted 11/2014 with aphasia and confusion and felt to have TIA. He could not undergo MRI due to pacemaker. He had been noncompliant with all of his home medications prior to admission. He was continued on ASA and Plavix. Neurology recommended anticoagulation with Coumadin but internal medicine discussed with the cardiology team who felt he was not a good candidate given his poor compliance. Device did not show any evidence of atrial fibrillation. 2D Echo 12/09/14: EF 15%, grade 2 DD, high ventricular filling pressures, multiple WMA, mild-mod AI, mod MR, mildly dilated RA, severely dilated LA.   He was recently admitted to [facility] from 4/26-4/28/17 for acute on chronic systolic CHF. Discharge weight 200 lbs. He saw Theodore Demark PA-C in the office on 06/27/15 for post hospital follow up. She emphasized compliance with salt and fluid restrictions. He was continued on Lasix 40mg  daily.   Phone note from 07/05/15: patient was more SOB so lasix increased to 80mg  daily x 3 days. BNP and BMET was also ordered. BNP was mildly elevated at 324. BMET was never obtained.   Today the patient presents to clinic for evaluation. He has been taking 80 mg Lasix since  that time. Still having sporadic SOB that comes and goes but overall it is much better. No LE edema, orthopnea or PND. NO dizziness or syncope. No Chest pain. He is very tearful today because he cant quit smoking. 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 He says quitting cigarettes is harder. Also upset bc he and his wife are getting separated.      Past Medical History  Diagnosis Date  . HTN (hypertension)   . HLD (hyperlipidemia) mixed  . Ischemic cardiomyopathy     a.  EF 30-35% 2010;  b.  EF 20-25% by LV gram 08/2012. c. EF 15% by echo 11/2014.  [name] CAD (coronary artery disease)     a.  Severe LAD stenosis 2/2 acute thrombus - BMS 2009;  b. 06/01/11 Cath - LAD 20 isr, 33m (3.0x16 Veri-flex BMS), OM1 100p, EF 20-25%;  c. 07/2012 Abnl Cardiolite;  d. 08/2012 Cath/PCI: LM nl, LAD patent stents, D2 80-90 (jailed), LCX 90 p/m (4.0x24 Promus Premier DES), OM1 100, OM2 80-90p (3.0x20 Promus Premier DES), RCA patent mid stent, 40d, EF 25%.  . Anxiety   . Paroxysmal ventricular tachycardia (HCC)     a. VFlutter  CL 210 msec  Rx shock 04/2011  . Chronic systolic CHF (congestive heart failure) (HCC)   . Automatic implantable cardioverter-defibrillator in situ     a. Medtronic ICD.  [name] History of rheumatic fever 1962  . Myocardial infarction [facility]) 1998; 2002; ~ 2010  . Depression   .  TIA (transient ischemic attack)     a. Dx 11/2014, continued on ASA/Plavix.  [name]. PVC's (premature ventricular contractions)   . RBBB     Past Surgical History  Procedure Laterality Date  . Defibrillator implantation  04/12/02    primary prevention for sudden death; Dr. Graciela HusbandsKlein   . Cardiac defibrillator placement      primary prevention for sudden death; Dr. Graciela HusbandsKlein   . Cardiac catheterization      "lots of those" (08/29/2012)  . Coronary angioplasty with stent placement  2002-08/29/2012    "think I had 4 total then 2 today" to LAD 06/01/2011/notes (08/29/2012)  . Laparoscopic cholecystectomy    . Hernia repair       "w/gallbladder OR" (08/29/2012)  . Percutaneous coronary stent intervention (pci-s) N/A 06/01/2011    Procedure: PERCUTANEOUS CORONARY STENT INTERVENTION (PCI-S);  Surgeon: Peter M SwazilandJordan, MD;  [facility];  Service: Cardiovascular;  Laterality: N/A;  . Left heart catheterization with coronary angiogram N/A 06/08/2011    Procedure: LEFT HEART CATHETERIZATION WITH CORONARY ANGIOGRAM;  Surgeon: Dolores Pattyaniel R Bensimhon, MD;  [facility];  Service: Cardiovascular;  Laterality: N/A;  . Percutaneous coronary stent intervention (pci-s) N/A 08/29/2012    Procedure: PERCUTANEOUS CORONARY STENT INTERVENTION (PCI-S);  Surgeon: Peter M SwazilandJordan, MD;  [facility];  Service: Cardiovascular;  Laterality: N/A;    Current Medications: Outpatient Prescriptions Prior to Visit  Medication Sig Dispense Refill  . aspirin EC 81 MG tablet Take 81 mg by mouth daily. Reported on 06/19/2015    . atorvastatin (LIPITOR) 40 MG tablet Take 1 tablet (40 mg total) by mouth daily at 6 PM. 30 tablet 11  . carvedilol (COREG) 3.125 MG tablet Take 1 tablet (3.125 mg total) by mouth 2 (two) times daily with a meal. 60 tablet 11  . citalopram (CELEXA) 10 MG tablet Take 10 mg by mouth daily as needed.     . clopidogrel (PLAVIX) 75 MG tablet Take 1 tablet (75 mg total) by mouth daily. Take one table by mouth everyday with breakfast. 30 tablet 11  . clotrimazole-betamethasone (LOTRISONE) cream Apply 1 application topically 2 (two) times daily. 15 g 0  . lisinopril (PRINIVIL,ZESTRIL) 5 MG tablet Take 1 tablet (5 mg total) by mouth daily. 30 tablet 6  . nitroGLYCERIN (NITROSTAT) 0.4 MG SL tablet Place 1 tablet (0.4 mg total) under the tongue every 5 (five) minutes as needed. For chest pain 25 tablet 11  . sildenafil (VIAGRA) 50 MG tablet Take 1 tablet (50 mg total) by mouth daily as needed for erectile dysfunction. 15 tablet 3  . furosemide (LASIX) 40 MG tablet Take 1 tablet (40 mg total) by mouth daily. (Patient not  taking: Reported on 07/18/2015) 30 tablet 6  . sertraline (ZOLOFT) 50 MG tablet Take 1.5 tablets (75 mg total) by mouth daily. (Patient not taking: Reported on 07/18/2015) 45 tablet 11  . traZODone (DESYREL) 50 MG tablet Take 1 tablet (50 mg total) by mouth at bedtime as needed for sleep. (Patient not taking: Reported on 07/18/2015) 30 tablet 1   No facility-administered medications prior to visit.     Allergies:   Review of patient's allergies indicates no known allergies.   Social History   Social History  . Marital Status: Married    Spouse Name: N/A  . Number of Children: N/A  . Years of Education: N/A   Social History Main Topics  . Smoking status: Current Every Day Smoker -- 0.50 packs/day for 44  years    Types: Cigarettes    Last Attempt to Quit: 05/27/2011  . Smokeless tobacco: Never Used  . Alcohol Use: No     Comment: 08/29/2012 "6-8 beers once/wk". Has not had alcohol in the last 6-8 months  . Drug Use: No  . Sexual Activity: Not Currently   Other Topics Concern  . None   Social History Narrative   Married; full time.      Family History:  The patient's family history includes Cancer in his father and sister; Hypertension in his brother. There is no history of Heart attack or Stroke.   ROS:   Please see the history of present illness.    ROS All other systems reviewed and are negative.   PHYSICAL EXAM:   VS:  BP 90/72 mmHg  Pulse 86  Ht  (1.803 m)  Wt 201 lb (91.173 kg)  BMI 28.05 kg/m2   GEN: Well nourished, well developed, in no acute distress HEENT: normal Neck: no JVD, carotid bruits, or masses Cardiac: RRR; no murmurs, rubs, or gallops,no edema  Respiratory:  clear to auscultation bilaterally, normal work of breathing GI: soft, nontender, nondistended, + BS MS: no deformity or atrophy Skin: warm and dry, no rash Neuro:  Alert and Oriented x 3, Strength and sensation are intact Psych: euthymic mood, full affect  Wt Readings from Last 3  Encounters:  07/18/15 201 lb (91.173 kg)  06/27/15 201 lb 9.6 oz (91.445 kg)  06/21/15 200 lb 14.4 oz (91.128 kg)      Studies/Labs Reviewed:   EKG:  EKG is NOT ordered today.   Recent Labs: 12/11/2014: ALT 16*; Magnesium 2.0; TSH 1.739 06/19/2015: Hemoglobin 11.2*; Platelets 201 06/27/2015: BUN 20; Creat 0.90; Potassium 4.9; Sodium 137 07/09/2015: Brain Natriuretic Peptide 324.1*   Lipid Panel    Component Value Date/Time   CHOL 183 12/09/2014 0310   TRIG 61 12/09/2014 0310   HDL 41 12/09/2014 0310   CHOLHDL 4.5 12/09/2014 0310   VLDL 12 12/09/2014 0310   LDLCALC 130* 12/09/2014 0310   LDLDIRECT 159.2 02/04/2010 1622    Additional studies/ records that were reviewed today include:   2D ECHO: 12/09/2014 Study Conclusions - Left ventricle: Systolic function is severely reduced, estimated  EF 15%. The cavity size was moderately dilated. Features are  consistent with a pseudonormal left ventricular filling pattern,  with concomitant abnormal relaxation and increased filling  pressure (grade 2 diastolic dysfunction). Doppler parameters are  consistent with high ventricular filling pressure. - Regional wall motion abnormality: Akinesis of the mid-apical  anterior, apical inferior, mid anterolateral, apical lateral, and  apical myocardium; severe hypokinesis of the mid anteroseptal,  mid inferior, and apical septal myocardium; moderate hypokinesis  of the basal anteroseptal, basal inferior, and basal-mid  inferolateral myocardium. - Aortic valve: Mildly calcified annulus. Trileaflet. There was  mild to moderate regurgitation. - Mitral valve: There was moderate regurgitation. - Left atrium: The atrium was severely dilated. Volume/bsa, ES,  (1-plane Simpson&'s, A2C): 48.1 ml/m^2. - Right ventricle: Pacer wire or catheter noted in right ventricle.  Systolic function was mildly reduced. - Right atrium: The atrium was mildly dilated.     ASSESSMENT:    1.  Acute on chronic systolic (congestive) heart failure (HCC)   2. Essential hypertension   3. HLD (hyperlipidemia)   4. Coronary artery disease involving native coronary artery of native heart without angina pectoris   5. Ventricular tachycardia (HCC)   6. Tobacco abuse   7. Dyspnea  PLAN:  In order of problems listed above:  Acute on chronic systolic CHF/ Ischemic cardiomyopathy: recent admission for A/C S CHF. Discharge weight 200lbs. Called our office 5/12- dad worseing SOB and lasix increased from 40mg  daily to 80mg  daily. BNP ~300. BMET never obtained- will order today. He has been on lasix 80 mg daily since 07/05/15. Optival interrogated yesterday shows that he is now close to baseline. Will keep lasix dosing the same and adjust according to labwork results. Currently on no K supplementation. He discussed continued monitoring of his optival by Randon Goldsmith RN. He is thinking about this.  -- Continue low dose ACE and BB.   HTN: BP soft today. He is asymptomatic so will keep meds the same and continue to monitor.   HLD:  continue statin.  CAD:  no recent angina noted. Continue ASA, statin and BB.  PVCs/history of paroxysmal VT: Continue BB and to follow with EP for his VT/ICD.   Tobacco abuse: we discussed importance of cessation. I empathized with him about his social situation but also told him I think his chronic DOE has the chance to worsen if he continues to smoke. He is tearful about being able to quit. He desperately wants to. We will trial welbutrin XL 150mg  BID (XL cheaper than SR)  Ongoing dyspnea: with long history of smoking and continued intermittent SOB despite euvolemia, will get PFTs.    Medication Adjustments/Labs and Tests Ordered: Current medicines are reviewed at length with the patient today.  Concerns regarding medicines are outlined above.  Medication changes, Labs and Tests ordered today are listed in the Patient Instructions below. Patient Instructions    Schedule Pulmonary Function Test at [facility]   Lab work today ( Bmet )   Start Wellbutrin XR 150 mg daily for 3 days then 150 mg twice a day    Your physician recommends that you schedule a follow-up appointment in: 3 months with Dr.Crenshaw      Signed, Cline Crock, PA-C  07/18/2015 12:41 PM    [facility] [address] [place], [place], [place]  [address] Phone: [phone]; Fax: [phone]

## 2015-07-18 ENCOUNTER — Other Ambulatory Visit: Payer: Self-pay

## 2015-07-18 ENCOUNTER — Ambulatory Visit (INDEPENDENT_AMBULATORY_CARE_PROVIDER_SITE_OTHER): Payer: Self-pay | Admitting: Physician Assistant

## 2015-07-18 ENCOUNTER — Other Ambulatory Visit (HOSPITAL_COMMUNITY): Payer: Self-pay | Admitting: Respiratory Therapy

## 2015-07-18 ENCOUNTER — Encounter: Payer: Self-pay | Admitting: Physician Assistant

## 2015-07-18 DIAGNOSIS — Z720 Tobacco use: Secondary | ICD-10-CM

## 2015-07-18 DIAGNOSIS — I472 Ventricular tachycardia, unspecified: Secondary | ICD-10-CM

## 2015-07-18 DIAGNOSIS — I5021 Acute systolic (congestive) heart failure: Secondary | ICD-10-CM

## 2015-07-18 DIAGNOSIS — I10 Essential (primary) hypertension: Secondary | ICD-10-CM

## 2015-07-18 DIAGNOSIS — R0602 Shortness of breath: Secondary | ICD-10-CM

## 2015-07-18 DIAGNOSIS — I5022 Chronic systolic (congestive) heart failure: Secondary | ICD-10-CM

## 2015-07-18 DIAGNOSIS — R0600 Dyspnea, unspecified: Secondary | ICD-10-CM

## 2015-07-18 DIAGNOSIS — E785 Hyperlipidemia, unspecified: Secondary | ICD-10-CM

## 2015-07-18 DIAGNOSIS — I2510 Atherosclerotic heart disease of native coronary artery without angina pectoris: Secondary | ICD-10-CM

## 2015-07-18 DIAGNOSIS — I5023 Acute on chronic systolic (congestive) heart failure: Secondary | ICD-10-CM

## 2015-07-18 LAB — BASIC METABOLIC PANEL
BUN: 18 mg/dL (ref 7–25)
CALCIUM: 8.9 mg/dL (ref 8.6–10.3)
CO2: 23 mmol/L (ref 20–31)
Chloride: 104 mmol/L (ref 98–110)
Creat: 1.09 mg/dL (ref 0.70–1.25)
GLUCOSE: 88 mg/dL (ref 65–99)
Potassium: 4.7 mmol/L (ref 3.5–5.3)
SODIUM: 136 mmol/L (ref 135–146)

## 2015-07-18 MED ORDER — BUPROPION HCL ER (XL) 150 MG PO TB24: ORAL_TABLET | ORAL | Status: DC

## 2015-07-18 NOTE — Telephone Encounter (Signed)
Received remote transmission and given to Cline CrockKathryn Thompson, PA for review.

## 2015-07-18 NOTE — Patient Instructions (Signed)
Schedule Pulmonary Function Test at [facility]   Lab work today ( Bmet )   Start Wellbutrin XR 150 mg daily for 3 days then 150 mg twice a day    Your physician recommends that you schedule a follow-up appointment in: 3 months with Dr.Crenshaw

## 2015-07-19 ENCOUNTER — Other Ambulatory Visit: Payer: Self-pay | Admitting: Physician Assistant

## 2015-07-19 NOTE — Progress Notes (Signed)
Remote ICD transmission.   

## 2015-07-25 ENCOUNTER — Encounter: Payer: Self-pay | Admitting: Cardiology

## 2015-07-25 ENCOUNTER — Telehealth: Payer: Self-pay | Admitting: Cardiology

## 2015-07-25 NOTE — Telephone Encounter (Signed)
Left message of results for pt  

## 2015-07-25 NOTE — Telephone Encounter (Signed)
Follow-up    The pt is returning [name] call from yesterday afternoon about lab work.

## 2015-07-31 ENCOUNTER — Ambulatory Visit (HOSPITAL_COMMUNITY)
Admission: RE | Admit: 2015-07-31 | Discharge: 2015-07-31 | Disposition: A | Discharge status: Home / Self Care | Payer: MEDICAID | Source: Ambulatory Visit | Attending: Physician Assistant | Admitting: Physician Assistant

## 2015-07-31 DIAGNOSIS — I5022 Chronic systolic (congestive) heart failure: Secondary | ICD-10-CM | POA: Insufficient documentation

## 2015-07-31 DIAGNOSIS — R0602 Shortness of breath: Secondary | ICD-10-CM | POA: Insufficient documentation

## 2015-07-31 LAB — PULMONARY FUNCTION TEST
DL/VA % PRED: 91 %
DL/VA: 4.28 ml/min/mmHg/L
DLCO UNC % PRED: 71 %
DLCO UNC: 24.09 ml/min/mmHg
FEF 25-75 POST: 3.59 L/s
FEF 25-75 PRE: 2.55 L/s
FEF2575-%Change-Post: 41 %
FEF2575-%PRED-POST: 121 %
FEF2575-%PRED-PRE: 86 %
FEV1-%CHANGE-POST: 5 %
FEV1-%PRED-PRE: 83 %
FEV1-%Pred-Post: 88 %
FEV1-POST: 3.23 L
FEV1-PRE: 3.07 L
FEV1FVC-%Change-Post: 4 %
FEV1FVC-%PRED-PRE: 106 %
FEV6-%CHANGE-POST: 3 %
FEV6-%PRED-POST: 83 %
FEV6-%Pred-Pre: 80 %
FEV6-Post: 3.86 L
FEV6-Pre: 3.74 L
FEV6FVC-%CHANGE-POST: 3 %
FEV6FVC-%PRED-POST: 105 %
FEV6FVC-%Pred-Pre: 102 %
FVC-%CHANGE-POST: 0 %
FVC-%PRED-POST: 79 %
FVC-%Pred-Pre: 79 %
FVC-Post: 3.87 L
FVC-Pre: 3.85 L
POST FEV1/FVC RATIO: 83 %
PRE FEV1/FVC RATIO: 80 %
Post FEV6/FVC ratio: 100 %
Pre FEV6/FVC Ratio: 97 %
RV % pred: 173 %
RV: 4.08 L
TLC % PRED: 110 %
TLC: 7.95 L

## 2015-07-31 MED ORDER — ALBUTEROL SULFATE (2.5 MG/3ML) 0.083% IN NEBU
2.5000 mg | INHALATION_SOLUTION | Freq: Once | RESPIRATORY_TRACT | Status: AC
  Administered 2015-07-31: 2.5 mg via RESPIRATORY_TRACT

## 2015-08-04 LAB — CUP PACEART REMOTE DEVICE CHECK
Brady Statistic RV Percent Paced: 0 %
Date Time Interrogation Session: 20170524220615
HIGH POWER IMPEDANCE MEASURED VALUE: 42 Ohm
HIGH POWER IMPEDANCE MEASURED VALUE: 56 Ohm
Lead Channel Sensing Intrinsic Amplitude: 1.75 mV
Lead Channel Sensing Intrinsic Amplitude: 1.75 mV
Lead Channel Setting Pacing Amplitude: 3 V
Lead Channel Setting Pacing Pulse Width: 0.4 ms
Lead Channel Setting Sensing Sensitivity: 0.3 mV
MDC IDC LEAD IMPLANT DT: 20040218
MDC IDC LEAD LOCATION: 753860
MDC IDC MSMT BATTERY VOLTAGE: 2.7 V
MDC IDC MSMT LEADCHNL RV IMPEDANCE VALUE: 418 Ohm

## 2015-08-05 ENCOUNTER — Telehealth: Payer: Self-pay | Admitting: Cardiology

## 2015-08-05 NOTE — Telephone Encounter (Signed)
Pt just called back to make sure he gave you the right phone number,[phone].

## 2015-08-05 NOTE — Telephone Encounter (Signed)
Returned call to patient. Gave him result of PFT and stressed the need to stop smoking to prevent further damage.  He verbalized understanding and said he was trying to do that now. He then asked if he should be on an inhaler. He said he notices he is wheezy in the evenings when he sits and it gets quiet. I asked if he has a PCP, he said that he was in the process of getting one. I encouraged him to do so and that in the future they or another doctor would manage his lungs.  I will forward to Cline CrockKathryn Thompson, PA for further advice at this time.

## 2015-08-05 NOTE — Telephone Encounter (Signed)
Follow-up    The pt is calling back to get his results of the test done last week.

## 2015-08-05 NOTE — Telephone Encounter (Signed)
Agree with getting PCP

## 2015-08-13 ENCOUNTER — Encounter: Payer: Self-pay | Admitting: Cardiology

## 2015-08-26 NOTE — Telephone Encounter (Signed)
This encounter was created in error - please disregard.

## 2015-09-16 ENCOUNTER — Encounter: Payer: Self-pay | Admitting: Cardiology

## 2015-09-26 NOTE — Progress Notes (Signed)
HPI: FU hypertension, hyperlipidemia, history of rheumatic fever, CAD s/p multiple stents as below, paroxysmal ventricular tachycardia, ischemic cardiomyopathy, chronic systolic heart failure s/p Medtronic AICD. Cardiac catheterization July 2014 showed a normal left main, patent stents in the LAD with an 80-90% second diagonal (jailed), 90% proximal circumflex, occluded first obtuse marginal, 80-90% second obtuse marginal and ejection fraction 25%. Patient had PCI of his circumflex and second obtuse marginal at that time. He was admitted 11/2014 with aphasia and confusion and felt to have TIA. He could not undergo MRI due to pacemaker. He had been noncompliant with all of his home medications prior to admission. He was continued on ASA and Plavix. Neurology recommended anticoagulation with Coumadin but internal medicine discussed with the cardiology team who felt he was not a good candidate given his poor compliance. Device did not show any evidence of atrial fibrillation. 2D Echo 12/09/14: EF 15%, grade 2 DD, high ventricular filling pressures, multiple WMA, mild-mod AI, mod MR, mildly dilated RA, severely dilated LA. Since last seen,   Current Outpatient Prescriptions  Medication Sig Dispense Refill  . aspirin EC 81 MG tablet Take 81 mg by mouth daily. Reported on 06/19/2015    . atorvastatin (LIPITOR) 40 MG tablet Take 1 tablet (40 mg total) by mouth daily at 6 PM. 30 tablet 11  . buPROPion (WELLBUTRIN XL) 150 MG 24 hr tablet Take 150 mg daily for 3 days then 150 mg twice a day 60 tablet 3  . carvedilol (COREG) 3.125 MG tablet Take 1 tablet (3.125 mg total) by mouth 2 (two) times daily with a meal. 60 tablet 11  . citalopram (CELEXA) 10 MG tablet Take 10 mg by mouth daily as needed.     . clopidogrel (PLAVIX) 75 MG tablet Take 1 tablet (75 mg total) by mouth daily. Take one table by mouth everyday with breakfast. 30 tablet 11  . clotrimazole-betamethasone (LOTRISONE) cream Apply 1 application  topically 2 (two) times daily. 15 g 0  . furosemide (LASIX) 40 MG tablet Take 80 mg by mouth.    [name] lisinopril (PRINIVIL,ZESTRIL) 5 MG tablet Take 1 tablet (5 mg total) by mouth daily. 30 tablet 6  . nitroGLYCERIN (NITROSTAT) 0.4 MG SL tablet Place 1 tablet (0.4 mg total) under the tongue every 5 (five) minutes as needed. For chest pain 25 tablet 11  . sildenafil (VIAGRA) 50 MG tablet Take 1 tablet (50 mg total) by mouth daily as needed for erectile dysfunction. 15 tablet 3   No current facility-administered medications for this visit.      Past Medical History:  Diagnosis Date  . Anxiety   . Automatic implantable cardioverter-defibrillator in situ    a. Medtronic ICD.  [name] CAD (coronary artery disease)    a.  Severe LAD stenosis 2/2 acute thrombus - BMS 2009;  b. 06/01/11 Cath - LAD 20 isr, 38m (3.0x16 Veri-flex BMS), OM1 100p, EF 20-25%;  c. 07/2012 Abnl Cardiolite;  d. 08/2012 Cath/PCI: LM nl, LAD patent stents, D2 80-90 (jailed), LCX 90 p/m (4.0x24 Promus Premier DES), OM1 100, OM2 80-90p (3.0x20 Promus Premier DES), RCA patent mid stent, 40d, EF 25%.  . Chronic systolic CHF (congestive heart failure) (HCC)   . Depression   . History of rheumatic fever 1962  . HLD (hyperlipidemia) mixed  . HTN (hypertension)   . Ischemic cardiomyopathy    a.  EF 30-35% 2010;  b.  EF 20-25% by LV gram 08/2012. c. EF 15% by echo 11/2014.  [name]  Myocardial infarction [facility]) 1998; 2002; ~ 2010  . Paroxysmal ventricular tachycardia (HCC)    a. VFlutter  CL 210 msec  Rx shock 04/2011  . PVC's (premature ventricular contractions)   . RBBB   . TIA (transient ischemic attack)    a. Dx 11/2014, continued on ASA/Plavix.    Past Surgical History:  Procedure Laterality Date  . CARDIAC CATHETERIZATION     "lots of those" (08/29/2012)  . CARDIAC DEFIBRILLATOR PLACEMENT     primary prevention for sudden death; Dr. Graciela Husbands   . CORONARY ANGIOPLASTY WITH STENT PLACEMENT  2002-08/29/2012   "think I had 4 total then 2 today" to  LAD 06/01/2011/notes (08/29/2012)  . defibrillator implantation  04/12/02   primary prevention for sudden death; Dr. Graciela Husbands   . HERNIA REPAIR     "w/gallbladder OR" (08/29/2012)  . LAPAROSCOPIC CHOLECYSTECTOMY    . LEFT HEART CATHETERIZATION WITH CORONARY ANGIOGRAM N/A 06/08/2011   Procedure: LEFT HEART CATHETERIZATION WITH CORONARY ANGIOGRAM;  Surgeon: Dolores Patty, MD;  [facility] C[facility] LAB;  Service: Cardiovascular;  Laterality: N/A;  . PERCUTANEOUS CORONARY STENT INTERVENTION (PCI-S) N/A 06/01/2011   Procedure: PERCUTANEOUS CORONARY STENT INTERVENTION (PCI-S);  Surgeon: Peter M Swaziland, MD;  Location: [facility] CATH LAB;  Service: Cardiovascular;  Laterality: N/A;  . PERCUTANEOUS CORONARY STENT INTERVENTION (PCI-S) N/A 08/29/2012   Procedure: PERCUTANEOUS CORONARY STENT INTERVENTION (PCI-S);  Surgeon: Peter M Swaziland, MD;  Location: [facility] CATH LAB;  Service: Cardiovascular;  Laterality: N/A;    Social History   Social History  . Marital status: Married    Spouse name: N/A  . Number of children: N/A  . Years of education: N/A   Occupational History  . Not on file.   Social History Main Topics  . Smoking status: Current Every Day Smoker    Packs/day: 0.50    Years: 44.00    Types: Cigarettes    Last attempt to quit: 05/27/2011  . Smokeless tobacco: Never Used  . Alcohol use No     Comment: 08/29/2012 "6-8 beers once/wk". Has not had alcohol in the last 6-8 months  . Drug use: No  . Sexual activity: Not Currently   Other Topics Concern  . Not on file   Social History Narrative   Married; full time.     Family History  Problem Relation Age of Onset  . Cancer Father   . Cancer Sister     twin sister and only one has cancer, unknown what kind  . Hypertension Brother   . Cancer      family hx  . Heart attack Neg Hx   . Stroke Neg Hx     ROS: no fevers or chills, productive cough, hemoptysis, dysphasia, odynophagia, melena, hematochezia, dysuria, hematuria, rash, seizure activity,  orthopnea, PND, pedal edema, claudication. Remaining systems are negative.  Physical Exam: Well-developed well-nourished in no acute distress.  Skin is warm and dry.  HEENT is normal.  Neck is supple.  Chest is clear to auscultation with normal expansion.  Cardiovascular exam is regular rate and rhythm.  Abdominal exam nontender or distended. No masses palpated. Extremities show no edema. neuro grossly intact  ECG    This encounter was created in error - please disregard.

## 2015-10-03 ENCOUNTER — Encounter: Payer: Self-pay | Admitting: Cardiology

## 2015-10-18 ENCOUNTER — Encounter (HOSPITAL_COMMUNITY): Payer: Self-pay

## 2015-10-18 ENCOUNTER — Emergency Department (HOSPITAL_COMMUNITY)
Admission: EM | Admit: 2015-10-18 | Discharge: 2015-10-18 | Disposition: A | Discharge status: Home / Self Care | Payer: Self-pay | Attending: Emergency Medicine | Admitting: Emergency Medicine

## 2015-10-18 ENCOUNTER — Emergency Department (HOSPITAL_COMMUNITY): Payer: Self-pay

## 2015-10-18 DIAGNOSIS — Z79899 Other long term (current) drug therapy: Secondary | ICD-10-CM | POA: Insufficient documentation

## 2015-10-18 DIAGNOSIS — I5023 Acute on chronic systolic (congestive) heart failure: Secondary | ICD-10-CM | POA: Insufficient documentation

## 2015-10-18 DIAGNOSIS — R0789 Other chest pain: Secondary | ICD-10-CM | POA: Insufficient documentation

## 2015-10-18 DIAGNOSIS — Z955 Presence of coronary angioplasty implant and graft: Secondary | ICD-10-CM | POA: Insufficient documentation

## 2015-10-18 DIAGNOSIS — F17210 Nicotine dependence, cigarettes, uncomplicated: Secondary | ICD-10-CM | POA: Insufficient documentation

## 2015-10-18 DIAGNOSIS — R072 Precordial pain: Secondary | ICD-10-CM

## 2015-10-18 DIAGNOSIS — I252 Old myocardial infarction: Secondary | ICD-10-CM | POA: Insufficient documentation

## 2015-10-18 DIAGNOSIS — Z95810 Presence of automatic (implantable) cardiac defibrillator: Secondary | ICD-10-CM | POA: Insufficient documentation

## 2015-10-18 DIAGNOSIS — F419 Anxiety disorder, unspecified: Secondary | ICD-10-CM | POA: Insufficient documentation

## 2015-10-18 DIAGNOSIS — I2510 Atherosclerotic heart disease of native coronary artery without angina pectoris: Secondary | ICD-10-CM | POA: Insufficient documentation

## 2015-10-18 DIAGNOSIS — I110 Hypertensive heart disease with heart failure: Secondary | ICD-10-CM | POA: Insufficient documentation

## 2015-10-18 DIAGNOSIS — Z8673 Personal history of transient ischemic attack (TIA), and cerebral infarction without residual deficits: Secondary | ICD-10-CM | POA: Insufficient documentation

## 2015-10-18 LAB — BASIC METABOLIC PANEL
ANION GAP: 9 (ref 5–15)
BUN: 16 mg/dL (ref 6–20)
CALCIUM: 8.7 mg/dL — AB (ref 8.9–10.3)
CO2: 23 mmol/L (ref 22–32)
Chloride: 103 mmol/L (ref 101–111)
Creatinine, Ser: 1.21 mg/dL (ref 0.61–1.24)
GFR calc non Af Amer: >60 mL/min (ref >60)
Glucose, Bld: 104 mg/dL — ABNORMAL HIGH (ref 65–99)
POTASSIUM: 3.7 mmol/L (ref 3.5–5.1)
Sodium: 135 mmol/L (ref 135–145)

## 2015-10-18 LAB — HEPATIC FUNCTION PANEL
ALBUMIN: 3.4 g/dL — AB (ref 3.5–5.0)
ALT: 28 U/L (ref 17–63)
AST: 33 U/L (ref 15–41)
Alkaline Phosphatase: 51 U/L (ref 38–126)
BILIRUBIN TOTAL: 0.8 mg/dL (ref 0.3–1.2)
Bilirubin, Direct: 0.2 mg/dL (ref 0.1–0.5)
Indirect Bilirubin: 0.6 mg/dL (ref 0.3–0.9)
Total Protein: 7 g/dL (ref 6.5–8.1)

## 2015-10-18 LAB — CBC
HCT: 41.4 % (ref 39.0–52.0)
HEMOGLOBIN: 13.5 g/dL (ref 13.0–17.0)
MCH: 31 pg (ref 26.0–34.0)
MCHC: 32.6 g/dL (ref 30.0–36.0)
MCV: 95 fL (ref 78.0–100.0)
Platelets: 245 10*3/uL (ref 150–400)
RBC: 4.36 MIL/uL (ref 4.22–5.81)
RDW: 14.8 % (ref 11.5–15.5)
WBC: 10.2 10*3/uL (ref 4.0–10.5)

## 2015-10-18 LAB — I-STAT TROPONIN, ED
TROPONIN I, POC: 0.01 ng/mL (ref 0.00–0.08)
TROPONIN I, POC: 0.01 ng/mL (ref 0.00–0.08)
Troponin i, poc: 0 ng/mL (ref 0.00–0.08)

## 2015-10-18 LAB — D-DIMER, QUANTITATIVE: D-Dimer, Quant: 0.92 ug/mL-FEU — ABNORMAL HIGH (ref 0.00–0.50)

## 2015-10-18 MED ORDER — SODIUM CHLORIDE 0.9 % IV BOLUS (SEPSIS)
1000.0000 mL | Freq: Once | INTRAVENOUS | Status: AC
  Administered 2015-10-18: 1000 mL via INTRAVENOUS

## 2015-10-18 MED ORDER — IOPAMIDOL (ISOVUE-370) INJECTION 76%
INTRAVENOUS | Status: AC
  Administered 2015-10-18: 78 mL via INTRAVENOUS
  Filled 2015-10-18: qty 100

## 2015-10-18 MED ORDER — IBUPROFEN 600 MG PO TABS: 600.0000 mg | ORAL_TABLET | Freq: Four times a day (QID) | ORAL | 0 refills | Status: DC | PRN

## 2015-10-18 MED ORDER — HYDROMORPHONE HCL 1 MG/ML IJ SOLN
1.0000 mg | Freq: Once | INTRAMUSCULAR | Status: AC
  Administered 2015-10-18: 1 mg via INTRAVENOUS
  Filled 2015-10-18: qty 1

## 2015-10-18 MED ORDER — METHOCARBAMOL 500 MG PO TABS
1000.0000 mg | ORAL_TABLET | Freq: Once | ORAL | Status: AC
  Administered 2015-10-18: 1000 mg via ORAL
  Filled 2015-10-18: qty 2

## 2015-10-18 MED ORDER — METHOCARBAMOL 500 MG PO TABS: 1000.0000 mg | ORAL_TABLET | Freq: Three times a day (TID) | ORAL | 0 refills | Status: DC | PRN

## 2015-10-18 MED ORDER — ALPRAZOLAM 0.25 MG PO TABS: 0.2500 mg | ORAL_TABLET | Freq: Three times a day (TID) | ORAL | 0 refills | Status: DC | PRN

## 2015-10-18 MED ORDER — MORPHINE SULFATE (PF) 4 MG/ML IV SOLN
4.0000 mg | Freq: Once | INTRAVENOUS | Status: AC
Start: 2015-10-18 — End: 2015-10-18
  Administered 2015-10-18: 4 mg via INTRAVENOUS
  Filled 2015-10-18: qty 1

## 2015-10-18 MED ORDER — LORAZEPAM 2 MG/ML IJ SOLN
0.5000 mg | Freq: Once | INTRAMUSCULAR | Status: AC
  Administered 2015-10-18: 0.5 mg via INTRAVENOUS
  Filled 2015-10-18: qty 1

## 2015-10-18 MED ORDER — KETOROLAC TROMETHAMINE 30 MG/ML IJ SOLN
30.0000 mg | Freq: Once | INTRAMUSCULAR | Status: AC
Start: 2015-10-18 — End: 2015-10-18
  Administered 2015-10-18: 30 mg via INTRAVENOUS
  Filled 2015-10-18: qty 1

## 2015-10-18 NOTE — ED Notes (Signed)
Pt to CT

## 2015-10-18 NOTE — ED Notes (Signed)
MD aware of patient request for pain medication.  

## 2015-10-18 NOTE — ED Notes (Signed)
Spoke to main lab for add on hepatic function panel

## 2015-10-18 NOTE — ED Provider Notes (Signed)
Patient presents with left-sided chest pain worse with movement and deep breathing that started yesterday. EKG is unchanged change from previous. Troponin 3 is normal. Evaluate by cardiology who don't believe this is cardiac related pain. Reevaluation of the patient. He is calm upon entering the room but then increases clearly becomes anxious. Left lateral chest pain is reproduced with palpation of the chest wall. There is no crepitance or deformity. Patient has had a CT angiogram of the chest without any demonstrated PE or trauma. We'll treat with muscle relaxants and anti-inflammatory and antianxiety medication. Patient states he normally takes for his anxiety. He ran out several days ago  Improved pain. OTC with NSAIDs, muscle relaxant and short supply of Xanax. Advised follow-up with his primary physician and cardiologist. Return precautions given.   Loren Raceravid Baron Parmelee, MD 10/18/15 1115

## 2015-10-18 NOTE — ED Notes (Signed)
Patient off the unit.

## 2015-10-18 NOTE — ED Provider Notes (Signed)
[facility]-EMERGENCY DEPT Provider Note   CSN: [id number] Arrival date & time: 10/18/15  [id number]   By signing my name below, I, Nelwyn SalisburyJoshua Fowler, attest that this documentation has been prepared under the direction and in the presence of Azalia BilisKevin Kent Riendeau, MD . Electronically Signed: Nelwyn SalisburyJoshua Fowler, Scribe. 10/18/2015. 2:54 AM.   History   Chief Complaint Chief Complaint  Patient presents with  . Chest Pain   The history is provided by the patient. No language interpreter was used.    HPI Comments:  [name] is a 60 y.o. Male with PMHx of CAD, CHF, HLD and HTN who presents to the Emergency Department complaining of waxing/waning constant sharp chest pain onset yesterday. Pt reports his symptoms came and went all day yesterday, but have been more constant today. He reports he took a nitroglycerin with mild brief relief, and continued taking his nitroglycerin every 5 minutes before calling EMS. He reports associated shortness of breath, back pain, cough and difficulty breathing. He denies any hx of blood clot. He notes he is compliant with all his normal medications. Pt reports he was given aspirin en route in the ambulance.  He reports that a pressure in one week ago he did have some isolated swelling to his left leg without pain.  This resolved.  Past Medical History:  Diagnosis Date  . Anxiety   . Automatic implantable cardioverter-defibrillator in situ    a. Medtronic ICD.  [name]. CAD (coronary artery disease)    a.  Severe LAD stenosis 2/2 acute thrombus - BMS 2009;  b. 06/01/11 Cath - LAD 20 isr, 6050m (3.0x16 Veri-flex BMS), OM1 100p, EF 20-25%;  c. 07/2012 Abnl Cardiolite;  d. 08/2012 Cath/PCI: LM nl, LAD patent stents, D2 80-90 (jailed), LCX 90 p/m (4.0x24 Promus Premier DES), OM1 100, OM2 80-90p (3.0x20 Promus Premier DES), RCA patent mid stent, 40d, EF 25%.  . Chronic systolic CHF (congestive heart failure) (HCC)   . Depression   . History of rheumatic fever 1962  . HLD (hyperlipidemia) mixed  . HTN  (hypertension)   . Ischemic cardiomyopathy    a.  EF 30-35% 2010;  b.  EF 20-25% by LV gram 08/2012. c. EF 15% by echo 11/2014.  [name]. Myocardial infarction [facility](HCC) 1998; 2002; ~ 2010  . Paroxysmal ventricular tachycardia (HCC)    a. VFlutter  CL 210 msec  Rx shock 04/2011  . PVC's (premature ventricular contractions)   . RBBB   . TIA (transient ischemic attack)    a. Dx 11/2014, continued on ASA/Plavix.    Patient Active Problem List   Diagnosis Date Noted  . DOE (dyspnea on exertion) 06/19/2015  . Acute systolic congestive heart failure, NYHA class 3 (HCC)   . Status post implantation of automatic cardioverter/defibrillator (AICD)   . H/O medication noncompliance   . Acute kidney insufficiency   . Chest pain at rest   . Depression 04/09/2015  . Chronic systolic CHF (congestive heart failure), NYHA class 2 (HCC) 04/09/2015  . Erectile dysfunction 04/09/2015  . RBBB   . PVC's (premature ventricular contractions)   . Chronic systolic congestive heart failure (HCC) 12/12/2014  . Generalized anxiety disorder 12/09/2014  . Major depressive disorder, recurrent episode (HCC) 12/09/2014  . TIA (transient ischemic attack)   . HLD (hyperlipidemia)   . Aphasia 05/04/2014  . Unstable angina (HCC) 06/02/2011  . CAD (coronary artery disease)   . Ischemic cardiomyopathy   . Automatic implantable cardioverter-defibrillator in situ   . TOBACCO ABUSE 02/04/2010  .  VENTRICULAR TACHYCARDIA 08/21/2008  . HYPERLIPIDEMIA-MIXED 06/04/2008  . ANXIETY 06/04/2008  . Essential hypertension 06/04/2008  . Cardiomyopathy, ischemic 06/04/2008  . CHEST PAIN-UNSPECIFIED 06/04/2008    Past Surgical History:  Procedure Laterality Date  . CARDIAC CATHETERIZATION     "lots of those" (08/29/2012)  . CARDIAC DEFIBRILLATOR PLACEMENT     primary prevention for sudden death; Dr. Graciela Husbands   . CORONARY ANGIOPLASTY WITH STENT PLACEMENT  2002-08/29/2012   "think I had 4 total then 2 today" to LAD 06/01/2011/notes (08/29/2012)  .  defibrillator implantation  04/12/02   primary prevention for sudden death; Dr. Graciela Husbands   . HERNIA REPAIR     "w/gallbladder OR" (08/29/2012)  . LAPAROSCOPIC CHOLECYSTECTOMY    . LEFT HEART CATHETERIZATION WITH CORONARY ANGIOGRAM N/A 06/08/2011   Procedure: LEFT HEART CATHETERIZATION WITH CORONARY ANGIOGRAM;  Surgeon: Dolores Patty, MD;  Location: [facility] CATH LAB;  Service: Cardiovascular;  Laterality: N/A;  . PERCUTANEOUS CORONARY STENT INTERVENTION (PCI-S) N/A 06/01/2011   Procedure: PERCUTANEOUS CORONARY STENT INTERVENTION (PCI-S);  Surgeon: Peter M Swaziland, MD;  Location: [facility] CATH LAB;  Service: Cardiovascular;  Laterality: N/A;  . PERCUTANEOUS CORONARY STENT INTERVENTION (PCI-S) N/A 08/29/2012   Procedure: PERCUTANEOUS CORONARY STENT INTERVENTION (PCI-S);  Surgeon: Peter M Swaziland, MD;  Location: [facility] CATH LAB;  Service: Cardiovascular;  Laterality: N/A;    Home Medications    Prior to Admission medications   Medication Sig Start Date End Date Taking? Authorizing Provider  atorvastatin (LIPITOR) 40 MG tablet Take 1 tablet (40 mg total) by mouth daily at 6 PM. 03/29/15  Yes [name], MD  buPROPion (WELLBUTRIN XL) 150 MG 24 hr tablet Take 150 mg daily for 3 days then 150 mg twice a day 07/18/15  Yes Janetta Hora, PA-C  carvedilol (COREG) 3.125 MG tablet Take 1 tablet (3.125 mg total) by mouth 2 (two) times daily with a meal. 03/29/15  Yes [name], MD  clopidogrel (PLAVIX) 75 MG tablet Take 1 tablet (75 mg total) by mouth daily. Take one table by mouth everyday with breakfast. Patient taking differently: Take 75 mg by mouth daily.  03/29/15  Yes [name], MD  furosemide (LASIX) 40 MG tablet Take 40 mg by mouth daily.    Yes Historical Provider, MD  lisinopril (PRINIVIL,ZESTRIL) 2.5 MG tablet Take 2.5 mg by mouth daily.   Yes Historical Provider, MD  nitroGLYCERIN (NITROSTAT) 0.4 MG SL tablet Place 1 tablet (0.4 mg total) under the tongue every 5 (five) minutes as needed. For chest  pain 03/29/15  Yes [name], MD  clotrimazole-betamethasone (LOTRISONE) cream Apply 1 application topically 2 (two) times daily. Patient not taking: Reported on 10/18/2015 06/27/15   Joline Salt Barrett, PA-C  lisinopril (PRINIVIL,ZESTRIL) 5 MG tablet Take 1 tablet (5 mg total) by mouth daily. Patient not taking: Reported on 10/18/2015 06/21/15   Arty Baumgartner, NP  sildenafil (VIAGRA) 50 MG tablet Take 1 tablet (50 mg total) by mouth daily as needed for erectile dysfunction. Patient not taking: Reported on 10/18/2015 04/09/15   Joline Salt Barrett, PA-C    Family History Family History  Problem Relation Age of Onset  . Cancer Father   . Cancer Sister     twin sister and only one has cancer, unknown what kind  . Hypertension Brother   . Cancer      family hx  . Heart attack Neg Hx   . Stroke Neg Hx     Social History Social History  Substance  Use Topics  . Smoking status: Current Every Day Smoker    Packs/day: 0.50    Years: 44.00    Types: Cigarettes    Last attempt to quit: 05/27/2011  . Smokeless tobacco: Never Used  . Alcohol use No     Comment: 08/29/2012 "6-8 beers once/wk". Has not had alcohol in the last 6-8 months     Allergies   Review of patient's allergies indicates no known allergies.   Review of Systems Review of Systems  Cardiovascular: Positive for chest pain.     Physical Exam Updated Vital Signs BP 106/75   Pulse 97   Temp 98.3 F (36.8 C) (Oral)   Resp 18   SpO2 99%   Physical Exam  Constitutional: He is oriented to person, place, and time. He appears well-developed and well-nourished.  HENT:  Head: Normocephalic and atraumatic.  Eyes: EOM are normal.  Neck: Normal range of motion.  Cardiovascular: Normal rate, regular rhythm, normal heart sounds and intact distal pulses.   Pulmonary/Chest: Effort normal and breath sounds normal. No respiratory distress.  Abdominal: Soft. He exhibits no distension. There is no tenderness.  Musculoskeletal:  Normal range of motion.  Neurological: He is alert and oriented to person, place, and time.  Skin: Skin is warm and dry.  Psychiatric: He has a normal mood and affect. Judgment normal.  Nursing note and vitals reviewed.    ED Treatments / Results  DIAGNOSTIC STUDIES:  Oxygen Saturation is 100% on RA, normal by my interpretation.    COORDINATION OF CARE:  2:55 AM Discussed treatment plan with pt at bedside which included imaging and blood work and pt agreed to plan.  Labs (all labs ordered are listed, but only abnormal results are displayed) Labs Reviewed  BASIC METABOLIC PANEL - Abnormal; Notable for the following:       Result Value   Glucose, Bld 104 (*)    Calcium 8.7 (*)    All other components within normal limits  HEPATIC FUNCTION PANEL - Abnormal; Notable for the following:    Albumin 3.4 (*)    All other components within normal limits  D-DIMER, QUANTITATIVE (NOT AT [facility]) - Abnormal; Notable for the following:    D-Dimer, Quant 0.92 (*)    All other components within normal limits  CBC  I-STAT TROPOININ, ED  Rosezena Sensor, ED    EKG  EKG Interpretation  Date/Time:  Friday October 18 2015 02:23:49 EDT Ventricular Rate:  101 PR Interval:    QRS Duration: 170 QT Interval:  398 QTC Calculation: 516 R Axis:   -86 Text Interpretation:  Sinus tachycardia Atrial premature complexes Probable left atrial enlargement Right bundle branch block Inferolateral infarct, acute No significant change was found Confirmed by Jayleena Stille  MD, Caryn Bee ([id number]) on 10/18/2015 2:52:30 AM       Radiology Ct Angio Chest Pe W And/or Wo Contrast  Result Date: 10/18/2015 CLINICAL DATA:  Shortness of breath with chest pain EXAM: CT ANGIOGRAPHY CHEST WITH CONTRAST TECHNIQUE: Multidetector CT imaging of the chest was performed using the standard protocol during bolus administration of intravenous contrast. Multiplanar CT image reconstructions and MIPs were obtained to evaluate the vascular  anatomy. CONTRAST:  78 mL Isovue 370 nonionic COMPARISON:  Chest radiograph October 18, 2015 FINDINGS: Cardiovascular: There is no demonstrable pulmonary embolus. There is prominence of the ascending thoracic aorta with a maximum transverse diameter of 4.1 x 4.0 cm. Note that the contrast bolus timing is not such as to assess for potential thoracic  aortic dissection. There are foci of atherosclerotic calcification in the aortic arch region. There is moderate calcification at the origin of the left subclavian artery. Visualized great vessels otherwise appear unremarkable. There is extensive coronary artery calcification. Pericardium is not appreciably thickened. There is moderate cardiomegaly. Pacemaker lead is attached to the right ventricle. The main pulmonary outflow tract measures 3.9 cm in diameter, a finding raising question of a degree of pulmonary arterial hypertension. Mediastinum/Nodes: Thyroid appears unremarkable. There are multiple small mediastinal lymph nodes. There is a right pretracheal lymph node near the level of the carina measuring 1.8 x 1.5 cm. There is a sub- carinal lymph node measuring 1.8 x 1.7 cm. Lungs/Pleura: There is patchy atelectatic change in the lung bases. There is no appreciable edema or consolidation. No pleural effusions are evident. Upper Abdomen: There is reflux of contrast into the inferior venous cava and hepatic veins. There is an apparent cyst along the anterior dome of the liver measuring 1.5 x 1.2 cm. The visualized upper abdominal structures otherwise appear unremarkable. Musculoskeletal: There is degenerative change in the thoracic spine. There are no blastic or lytic bone lesions. Review of the MIP images confirms the above findings. IMPRESSION: No demonstrable pulmonary embolus. Prominence of the ascending thoracic aorta with a measured transverse diameter of 4.1 x 4.0 cm. Recommend annual imaging followup by CTA or MRA. This recommendation follows 2010  ACCF/AHA/AATS/ACR/ASA/SCA/SCAI/SIR/STS/SVM Guidelines for the Diagnosis and Management of Patients with Thoracic Aortic Disease. Circulation. 2010; 121: Z610-R604. Note that the contrast bolus is not timed to adequately assess for potential thoracic aortic dissection. If dissection of the thoracic aorta is of clinical concern, a repeat study with contrast timed to optimize aortic visualization would be warranted. The patient has a pacemaker which would obviate MR as a consideration for assessment of the aorta. There is cardiomegaly. There is reflux of contrast into the inferior vena cava and hepatic veins suggesting a degree of increased right heart pressure. There are foci of coronary artery calcification as well as areas of calcification in the aorta and proximal left subclavian artery. Mildly enlarged pretracheal and sub- carinal lymph nodes of uncertain etiology. Mild bibasilar lung atelectasis. No frank edema or consolidation evident. Electronically Signed   By: Bretta Bang III M.D.   On: 10/18/2015 07:57   Dg Chest Portable 1 View  Result Date: 10/18/2015 CLINICAL DATA:  Central chest pain today. EXAM: PORTABLE CHEST 1 VIEW COMPARISON:  06/20/2015 FINDINGS: Single lead pacemaker remains in place. There is stable cardiomegaly. Previous left lung base status resolved. No new focal airspace disease. No pulmonary edema, pleural effusion or pneumothorax. Biapical pleural parenchymal scarring. IMPRESSION: Stable cardiomegaly.  No acute abnormality. Electronically Signed   By: Rubye Oaks M.D.   On: 10/18/2015 03:13    Procedures Procedures (including critical care time)  Medications Ordered in ED Medications  HYDROmorphone (DILAUDID) injection 1 mg (1 mg Intravenous Given 10/18/15 0257)  HYDROmorphone (DILAUDID) injection 1 mg (1 mg Intravenous Given 10/18/15 0455)  sodium chloride 0.9 % bolus 1,000 mL (0 mLs Intravenous Stopped 10/18/15 0656)  iopamidol (ISOVUE-370) 76 % injection (78 mLs  Intravenous Contrast Given 10/18/15 0711)     Initial Impression / Assessment and Plan / ED Course  I have reviewed the triage vital signs and the nursing notes.  Pertinent labs & imaging results that were available during my care of the patient were reviewed by me and considered in my medical decision making (see chart for details).  Clinical Course  Patient was waxing and waning pleuritic-like chest pain with associated shortness of breath.  He has a significant history of coronary disease and continues having discomfort and pain at this time.  Despite his right bundle branch block and 2 negative troponins I will still have cardiology evaluate the patient has a continues to have pain at this time.  Aspirin prior to arrival.  Additional pain medicine now.  8:15 AM I spoke with cardiology who will evaluate the patient in the ER  Final Clinical Impressions(s) / ED Diagnoses   Final diagnoses:  None    New Prescriptions New Prescriptions   No medications on file   I personally performed the services described in this documentation, which was scribed in my presence. The recorded information has been reviewed and is accurate.        Azalia Bilis, MD 10/18/15 [phone]

## 2015-10-18 NOTE — ED Triage Notes (Signed)
Patient comes via EMS for chest pain for 24 hrs.  EMS gave 324 Aspirin.  Patient took 3 nitro before EMS.  Patient A&Ox4. Pain rated at 7/10 now

## 2015-10-18 NOTE — Consult Note (Signed)
Consult    Patient ID: [name] MRN: [id number], DOB/AGE: 1955-06-04   Admit date: 10/18/2015   Primary Physician: Pcp Not In System Primary Cardiologist: Dr. Jens Som  Patient Profile     60 y.o. male with a history of CAD s/p multiple PCIs, ICM (EF 15%)/chronic systolic CHF, HTN, HLD, TIA, tobacco abuse and h/o VT s/p Medtronic ICD who presented to the ED with centralized chest pain that started yesterday afternoon.   Past Medical History    Past Medical History:  Diagnosis Date  . Anxiety   . Automatic implantable cardioverter-defibrillator in situ    a. Medtronic ICD.  [name] CAD (coronary artery disease)    a.  Severe LAD stenosis 2/2 acute thrombus - BMS 2009;  b. 06/01/11 Cath - LAD 20 isr, 62m (3.0x16 Veri-flex BMS), OM1 100p, EF 20-25%;  c. 07/2012 Abnl Cardiolite;  d. 08/2012 Cath/PCI: LM nl, LAD patent stents, D2 80-90 (jailed), LCX 90 p/m (4.0x24 Promus Premier DES), OM1 100, OM2 80-90p (3.0x20 Promus Premier DES), RCA patent mid stent, 40d, EF 25%.  . Chronic systolic CHF (congestive heart failure) (HCC)   . Depression   . History of rheumatic fever 1962  . HLD (hyperlipidemia) mixed  . HTN (hypertension)   . Ischemic cardiomyopathy    a.  EF 30-35% 2010;  b.  EF 20-25% by LV gram 08/2012. c. EF 15% by echo 11/2014.  [name] Myocardial infarction [facility]) 1998; 2002; ~ 2010  . Paroxysmal ventricular tachycardia (HCC)    a. VFlutter  CL 210 msec  Rx shock 04/2011  . PVC's (premature ventricular contractions)   . RBBB   . TIA (transient ischemic attack)    a. Dx 11/2014, continued on ASA/Plavix.    Past Surgical History:  Procedure Laterality Date  . CARDIAC CATHETERIZATION     "lots of those" (08/29/2012)  . CARDIAC DEFIBRILLATOR PLACEMENT     primary prevention for sudden death; Dr. Graciela Husbands   . CORONARY ANGIOPLASTY WITH STENT PLACEMENT  2002-08/29/2012   "think I had 4 total then 2 today" to LAD 06/01/2011/notes (08/29/2012)  . defibrillator implantation  04/12/02   primary  prevention for sudden death; Dr. Graciela Husbands   . HERNIA REPAIR     "w/gallbladder OR" (08/29/2012)  . LAPAROSCOPIC CHOLECYSTECTOMY    . LEFT HEART CATHETERIZATION WITH CORONARY ANGIOGRAM N/A 06/08/2011   Procedure: LEFT HEART CATHETERIZATION WITH CORONARY ANGIOGRAM;  Surgeon: Dolores Patty, MD;  Location: [facility] CATH LAB;  Service: Cardiovascular;  Laterality: N/A;  . PERCUTANEOUS CORONARY STENT INTERVENTION (PCI-S) N/A 06/01/2011   Procedure: PERCUTANEOUS CORONARY STENT INTERVENTION (PCI-S);  Surgeon: Peter M Swaziland, MD;  Location: [facility] CATH LAB;  Service: Cardiovascular;  Laterality: N/A;  . PERCUTANEOUS CORONARY STENT INTERVENTION (PCI-S) N/A 08/29/2012   Procedure: PERCUTANEOUS CORONARY STENT INTERVENTION (PCI-S);  Surgeon: Peter M Swaziland, MD;  Location: [facility] CATH LAB;  Service: Cardiovascular;  Laterality: N/A;     Allergies  No Known Allergies  History of Present Illness    [name] is a 60 yo male patient of Dr. Jens Som and Dr. Graciela Husbands with PMH of CAD s/p multiple PCIs, ICM (EF 15%)/chronic systolic CHF, HTN, HLD, TIA, tobacco abuse and h/o VT s/p Medtronic ICD. His last cardiac cath in 7/16 showed normal left main, patent stents in the LAD with an 80-90% second diagonal (jailed), 90% proximal circumflex, occluded first obtuse marginal, 80-90% second obtuse marginal and ejection fraction 25%. Patient had PCI of his circumflex and second obtuse marginal at that time. He  was later admitted on 10/16 with aphasia and confusion, felt likely to be a TIA. Neurology at that time recommended anticoagulation with coumadin, but this was discussed with cardiology who felt he would not a good candidate given his poor compliance. Device did not show any sign of atrial fibrillation.   Last 2D echo 10/16 showed EF 15%, grade 2 DD, high ventricular filling pressures, multiple WMA, mild-mod AI, mod MR, mildly dilated RA, severely dilated LA.   He was admitted 4/26-4/28/17 for acute on chronic CHF and diuresed inpatient  with his discharge weight was 200lbs.He followed up in the office with Theodore Demarkhonda Barrett PA, no medication changes were made at that time. He was last seen in the office on 5/24 by Carlean JewsKatie Thompson and reported sporadic SOB but with improvement. His lasix was recently increased prior to his office visit when he called reporting increased SOB, to 80mg  daily.   He reports doing well at home recently up until yesterday afternoon when he developed sharp centralized chest pain with dyspnea. Symptoms have waxed and waned over a 24 hours period. This morning reports taking 3 SL nitro with brief relief if chest pain. Also with lower back pain, cough, and difficulty taking a deep breath.   In the ED his EKG shows RBBB, with no acute changes. Enzymes have been negative x2. He has been given 2 doses of diluadid, and one dose of morphine. D-dimer noted a 0.9, but CTA negative for PE. Chest x-ray negative. At time of assessment he is very agitated, unable to sit still for physical exam. Reports to me that his pain is 10/10 when he tries to breath. Pain is somewhat reproducible to palpation.   Home Medications    Prior to Admission medications   Medication Sig Start Date End Date Taking? Authorizing Provider  atorvastatin (LIPITOR) 40 MG tablet Take 1 tablet (40 mg total) by mouth daily at 6 PM. 03/29/15  Yes Lewayne BuntingBrian S Seung Nidiffer, MD  buPROPion (WELLBUTRIN XL) 150 MG 24 hr tablet Take 150 mg daily for 3 days then 150 mg twice a day 07/18/15  Yes Janetta HoraKathryn R Thompson, PA-C  carvedilol (COREG) 3.125 MG tablet Take 1 tablet (3.125 mg total) by mouth 2 (two) times daily with a meal. 03/29/15  Yes Lewayne BuntingBrian S Avir Deruiter, MD  clopidogrel (PLAVIX) 75 MG tablet Take 1 tablet (75 mg total) by mouth daily. Take one table by mouth everyday with breakfast. Patient taking differently: Take 75 mg by mouth daily.  03/29/15  Yes Lewayne BuntingBrian S Ilka Lovick, MD  furosemide (LASIX) 40 MG tablet Take 40 mg by mouth daily.    Yes Historical Provider, MD  lisinopril  (PRINIVIL,ZESTRIL) 2.5 MG tablet Take 2.5 mg by mouth daily.   Yes Historical Provider, MD  nitroGLYCERIN (NITROSTAT) 0.4 MG SL tablet Place 1 tablet (0.4 mg total) under the tongue every 5 (five) minutes as needed. For chest pain 03/29/15  Yes Lewayne BuntingBrian S Dai Mcadams, MD  clotrimazole-betamethasone (LOTRISONE) cream Apply 1 application topically 2 (two) times daily. Patient not taking: Reported on 10/18/2015 06/27/15   Joline Salthonda G Barrett, PA-C  lisinopril (PRINIVIL,ZESTRIL) 5 MG tablet Take 1 tablet (5 mg total) by mouth daily. Patient not taking: Reported on 10/18/2015 06/21/15   Arty BaumgartnerLindsay B Roberts, NP  sildenafil (VIAGRA) 50 MG tablet Take 1 tablet (50 mg total) by mouth daily as needed for erectile dysfunction. Patient not taking: Reported on 10/18/2015 04/09/15   Darrol Jumphonda G Barrett, PA-C    Family History    Family History  Problem  Relation Age of Onset  . Cancer Father   . Cancer Sister     twin sister and only one has cancer, unknown what kind  . Hypertension Brother   . Cancer      family hx  . Heart attack Neg Hx   . Stroke Neg Hx     Social History    Social History   Social History  . Marital status: Married    Spouse name: N/A  . Number of children: N/A  . Years of education: N/A   Occupational History  . Not on file.   Social History Main Topics  . Smoking status: Current Every Day Smoker    Packs/day: 0.50    Years: 44.00    Types: Cigarettes    Last attempt to quit: 05/27/2011  . Smokeless tobacco: Never Used  . Alcohol use No     Comment: 08/29/2012 "6-8 beers once/wk". Has not had alcohol in the last 6-8 months  . Drug use: No  . Sexual activity: Not Currently   Other Topics Concern  . Not on file   Social History Narrative   Married; full time.      Review of Systems    General:  No chills, fever, night sweats or weight changes.  Cardiovascular:  See HPI Dermatological: No rash, lesions/masses Respiratory: See HPI Urologic: No hematuria, dysuria Abdominal:   No  nausea, vomiting, diarrhea, bright red blood per rectum, melena, or hematemesis Neurologic:  No visual changes, wkns, changes in mental status. All other systems reviewed and are otherwise negative except as noted above.  Physical Exam    Blood pressure 104/72, pulse 96, temperature 97.6 F (36.4 C), temperature source Oral, resp. rate 21, SpO2 100 %.  General: Caucasian male, NAD Psych: Agitated, restless, getting a dose of pain medication. Neuro: Alert and oriented X 3. Moves all extremities spontaneously. HEENT: Normal  Neck: Supple without bruits or JVD. Lungs:  Resp regular and unlabored, CTA. Heart: RRR no s3, s4, or murmurs. Abdomen: Soft, non-tender, non-distended, BS + x 4.  Extremities: No clubbing, cyanosis or edema. DP/PT/Radials 2+ and equal bilaterally.  Labs    Troponin [facility] of Care Test)  Recent Labs  10/18/15 0511  TROPIPOC 0.01   No results for input(s): CKTOTAL, CKMB, TROPONINI in the last 72 hours. Lab Results  Component Value Date   WBC 10.2 10/18/2015   HGB 13.5 10/18/2015   HCT 41.4 10/18/2015   MCV 95.0 10/18/2015   PLT 245 10/18/2015    Recent Labs Lab 10/18/15 0240  NA 135  K 3.7  CL 103  CO2 23  BUN 16  CREATININE 1.21  CALCIUM 8.7*  PROT 7.0  BILITOT 0.8  ALKPHOS 51  ALT 28  AST 33  GLUCOSE 104*   Lab Results  Component Value Date   CHOL 183 12/09/2014   HDL 41 12/09/2014   LDLCALC 130 (H) 12/09/2014   TRIG 61 12/09/2014   Lab Results  Component Value Date   DDIMER 0.92 (H) 10/18/2015     Radiology Studies    Ct Angio Chest Pe W And/or Wo Contrast  Result Date: 10/18/2015 CLINICAL DATA:  Shortness of breath with chest pain EXAM: CT ANGIOGRAPHY CHEST WITH CONTRAST TECHNIQUE: Multidetector CT imaging of the chest was performed using the standard protocol during bolus administration of intravenous contrast. Multiplanar CT image reconstructions and MIPs were obtained to evaluate the vascular anatomy. CONTRAST:  78 mL  Isovue 370 nonionic COMPARISON:  Chest radiograph October 18, 2015 FINDINGS: Cardiovascular: There is no demonstrable pulmonary embolus. There is prominence of the ascending thoracic aorta with a maximum transverse diameter of 4.1 x 4.0 cm. Note that the contrast bolus timing is not such as to assess for potential thoracic aortic dissection. There are foci of atherosclerotic calcification in the aortic arch region. There is moderate calcification at the origin of the left subclavian artery. Visualized great vessels otherwise appear unremarkable. There is extensive coronary artery calcification. Pericardium is not appreciably thickened. There is moderate cardiomegaly. Pacemaker lead is attached to the right ventricle. The main pulmonary outflow tract measures 3.9 cm in diameter, a finding raising question of a degree of pulmonary arterial hypertension. Mediastinum/Nodes: Thyroid appears unremarkable. There are multiple small mediastinal lymph nodes. There is a right pretracheal lymph node near the level of the carina measuring 1.8 x 1.5 cm. There is a sub- carinal lymph node measuring 1.8 x 1.7 cm. Lungs/Pleura: There is patchy atelectatic change in the lung bases. There is no appreciable edema or consolidation. No pleural effusions are evident. Upper Abdomen: There is reflux of contrast into the inferior venous cava and hepatic veins. There is an apparent cyst along the anterior dome of the liver measuring 1.5 x 1.2 cm. The visualized upper abdominal structures otherwise appear unremarkable. Musculoskeletal: There is degenerative change in the thoracic spine. There are no blastic or lytic bone lesions. Review of the MIP images confirms the above findings. IMPRESSION: No demonstrable pulmonary embolus. Prominence of the ascending thoracic aorta with a measured transverse diameter of 4.1 x 4.0 cm. Recommend annual imaging followup by CTA or MRA. This recommendation follows 2010  ACCF/AHA/AATS/ACR/ASA/SCA/SCAI/SIR/STS/SVM Guidelines for the Diagnosis and Management of Patients with Thoracic Aortic Disease. Circulation. 2010; 121: Z610-R604. Note that the contrast bolus is not timed to adequately assess for potential thoracic aortic dissection. If dissection of the thoracic aorta is of clinical concern, a repeat study with contrast timed to optimize aortic visualization would be warranted. The patient has a pacemaker which would obviate MR as a consideration for assessment of the aorta. There is cardiomegaly. There is reflux of contrast into the inferior vena cava and hepatic veins suggesting a degree of increased right heart pressure. There are foci of coronary artery calcification as well as areas of calcification in the aorta and proximal left subclavian artery. Mildly enlarged pretracheal and sub- carinal lymph nodes of uncertain etiology. Mild bibasilar lung atelectasis. No frank edema or consolidation evident. Electronically Signed   By: Bretta Bang III M.D.   On: 10/18/2015 07:57   Dg Chest Portable 1 View  Result Date: 10/18/2015 CLINICAL DATA:  Central chest pain today. EXAM: PORTABLE CHEST 1 VIEW COMPARISON:  06/20/2015 FINDINGS: Single lead pacemaker remains in place. There is stable cardiomegaly. Previous left lung base status resolved. No new focal airspace disease. No pulmonary edema, pleural effusion or pneumothorax. Biapical pleural parenchymal scarring. IMPRESSION: Stable cardiomegaly.  No acute abnormality. Electronically Signed   By: Rubye Oaks M.D.   On: 10/18/2015 03:13    ECG & Cardiac Imaging    EKG: RBBB, no acute changes  Echo: 12/09/2014  Study Conclusions  - Left ventricle: Systolic function is severely reduced, estimated   EF 15%. The cavity size was moderately dilated. Features are   consistent with a pseudonormal left ventricular filling pattern,   with concomitant abnormal relaxation and increased filling   pressure (grade 2  diastolic dysfunction). Doppler parameters are   consistent with high ventricular filling pressure. - Regional wall motion  abnormality: Akinesis of the mid-apical   anterior, apical inferior, mid anterolateral, apical lateral, and   apical myocardium; severe hypokinesis of the mid anteroseptal,   mid inferior, and apical septal myocardium; moderate hypokinesis   of the basal anteroseptal, basal inferior, and basal-mid   inferolateral myocardium. - Aortic valve: Mildly calcified annulus. Trileaflet. There was   mild to moderate regurgitation. - Mitral valve: There was moderate regurgitation. - Left atrium: The atrium was severely dilated. Volume/bsa, ES,   (1-plane Simpson&'s, A2C): 48.1 ml/m^2. - Right ventricle: Pacer wire or catheter noted in right ventricle.   Systolic function was mildly reduced. - Right atrium: The atrium was mildly dilated.  Assessment & Plan    60 y.o. male with a history of CAD s/p multiple PCIs, ICM (EF 15%)/chronic systolic CHF, HTN, HLD, TIA, tobacco abuse and h/o VT s/p Medtronic ICD who presented to the ED with centralized chest pain that started yesterday afternoon.   1. Chest pain/Dyspnea: Reports 24 hours of continuous centralized chest pain, that appears pleuritic in nature. Took 3 SL nitro this morning with brief mild relief. In the ED EKG shows RBBB, no acute changes. Enzymes negative x2. Has received 2 doses of dilaudid and one dose of morphine. Still with pain, stating "it hurts to breath". CTA was negative for PE. HR stable, BP soft. Chest x-ray was negative. Reports this pain is not similar to pain that he experienced with previous stenting. At this time, unsure of what may be causing his pain does not appear to be cardiac in origin.   2. HTN: Stable  3. HLD: On statin  4. Combined CHF with EF 15% (VT) s/p ICD: does not appear to be volume overloaded on exam. Reports being compliant with home medications.   Janice Coffin, NP-C Pager  [phone] 10/18/2015, 8:56 AM As above, patient seen in examined. Briefly he is a 60 year old male with past medical history of coronary artery disease, severe ischemic cardiomyopathy, ICD, hypertension, hyperlipidemia, prior TIA with chest pain. The patient states he developed chest pain at 9:00 yesterday morning. It began under his left ribs by report. The pain is sharp and radiates to the epigastric area. It increases with inspiration. No associated nausea or diaphoresis. His dyspnea. The pain has been continuous  For approximately 24 hours. He came to the emergency room early this morning. Cardiology is now asked to evaluate. Patient denies productive cough, hemoptysis, hematemesis, melena, hematochezia  Or diarrhea or abdominal pain. On exam he is writhing on the stretcher complaining of pain. Pulses are 2+ in all extremities. There is some tenderness to palpation the rib area. No abdominal tenderness to palpation.Chest CT shows no pulmonary embolus, thoracic aorta of 4.1 x 4 cm, and no edema. Enzymes are negative 2. Electrocardiogram shows sinus rhythm with first-degree AV block, right bundle branch block, left anterior fascicular block but no acute ST changes. Unchanged from previous.  1 chest pain-etiology of symptoms unclear.  However I do not think this is cardiac as his pain has been present continuously for 24 hours without completely resolving. His enzymes are negative and he has therefore ruled out. Electrocardiogram shows no ST changes. Would evaluate for other causes of chest pain. Note his chest CT shows no pulmonary embolus. He has 2+ pulses in all extremities and his pain is not radiating. I think dissection is unlikely. Question musculoskeletal. 2 coronary artery disease-continue Plavix and statin. 3 ischemic cardiomyopathy-continue ACE inhibitor and beta blocker. Cannot advance as blood pressure is borderline. 4  tobacco abuse-patient counseled on discontinuing. 5 chronic systolic  congestive heart failure-euvolemic on examination. Continue present dose of Lasix. 6 prior ICD. We will follow. Olga Millers, MD

## 2015-10-24 ENCOUNTER — Ambulatory Visit (INDEPENDENT_AMBULATORY_CARE_PROVIDER_SITE_OTHER): Payer: Self-pay | Admitting: *Deleted

## 2015-10-24 DIAGNOSIS — I472 Ventricular tachycardia, unspecified: Secondary | ICD-10-CM

## 2015-10-24 DIAGNOSIS — I255 Ischemic cardiomyopathy: Secondary | ICD-10-CM

## 2015-10-24 NOTE — Progress Notes (Signed)
Remote ICD transmission.   

## 2015-10-25 ENCOUNTER — Inpatient Hospital Stay (HOSPITAL_COMMUNITY)
Admission: AD | Admit: 2015-10-25 | Discharge: 2015-10-30 | DRG: 287 | Disposition: A | Discharge status: Home / Self Care | Payer: Self-pay | Source: Other Acute Inpatient Hospital | Attending: Cardiology | Admitting: Cardiology

## 2015-10-25 ENCOUNTER — Encounter (HOSPITAL_COMMUNITY): Payer: Self-pay | Admitting: Emergency Medicine

## 2015-10-25 ENCOUNTER — Encounter: Payer: Self-pay | Admitting: Cardiology

## 2015-10-25 DIAGNOSIS — Z7902 Long term (current) use of antithrombotics/antiplatelets: Secondary | ICD-10-CM

## 2015-10-25 DIAGNOSIS — I252 Old myocardial infarction: Secondary | ICD-10-CM

## 2015-10-25 DIAGNOSIS — I25110 Atherosclerotic heart disease of native coronary artery with unstable angina pectoris: Principal | ICD-10-CM | POA: Diagnosis present

## 2015-10-25 DIAGNOSIS — I472 Ventricular tachycardia: Secondary | ICD-10-CM | POA: Diagnosis present

## 2015-10-25 DIAGNOSIS — F329 Major depressive disorder, single episode, unspecified: Secondary | ICD-10-CM | POA: Diagnosis present

## 2015-10-25 DIAGNOSIS — Z955 Presence of coronary angioplasty implant and graft: Secondary | ICD-10-CM

## 2015-10-25 DIAGNOSIS — Z79899 Other long term (current) drug therapy: Secondary | ICD-10-CM

## 2015-10-25 DIAGNOSIS — Z8673 Personal history of transient ischemic attack (TIA), and cerebral infarction without residual deficits: Secondary | ICD-10-CM

## 2015-10-25 DIAGNOSIS — Z95810 Presence of automatic (implantable) cardiac defibrillator: Secondary | ICD-10-CM

## 2015-10-25 DIAGNOSIS — F17210 Nicotine dependence, cigarettes, uncomplicated: Secondary | ICD-10-CM | POA: Diagnosis present

## 2015-10-25 DIAGNOSIS — I272 Other secondary pulmonary hypertension: Secondary | ICD-10-CM | POA: Diagnosis present

## 2015-10-25 DIAGNOSIS — R0602 Shortness of breath: Secondary | ICD-10-CM

## 2015-10-25 DIAGNOSIS — I200 Unstable angina: Secondary | ICD-10-CM | POA: Diagnosis present

## 2015-10-25 DIAGNOSIS — E782 Mixed hyperlipidemia: Secondary | ICD-10-CM | POA: Diagnosis present

## 2015-10-25 DIAGNOSIS — F419 Anxiety disorder, unspecified: Secondary | ICD-10-CM | POA: Diagnosis present

## 2015-10-25 DIAGNOSIS — I4510 Unspecified right bundle-branch block: Secondary | ICD-10-CM | POA: Diagnosis present

## 2015-10-25 DIAGNOSIS — I255 Ischemic cardiomyopathy: Secondary | ICD-10-CM | POA: Diagnosis present

## 2015-10-25 DIAGNOSIS — I110 Hypertensive heart disease with heart failure: Secondary | ICD-10-CM | POA: Diagnosis present

## 2015-10-25 DIAGNOSIS — I5022 Chronic systolic (congestive) heart failure: Secondary | ICD-10-CM | POA: Diagnosis present

## 2015-10-25 LAB — TROPONIN I: Troponin I: <0.03 ng/mL (ref <0.03)

## 2015-10-25 LAB — MRSA PCR SCREENING: MRSA BY PCR: NEGATIVE

## 2015-10-25 MED ORDER — ASPIRIN EC 81 MG PO TBEC
81.0000 mg | DELAYED_RELEASE_TABLET | Freq: Every day | ORAL | Status: DC
  Administered 2015-10-26 – 2015-10-30 (×4): 81 mg via ORAL
  Filled 2015-10-25 (×4): qty 1

## 2015-10-25 MED ORDER — HYDROCODONE-ACETAMINOPHEN 5-325 MG PO TABS
1.0000 | ORAL_TABLET | ORAL | Status: DC | PRN
  Administered 2015-10-25: 1 via ORAL
  Administered 2015-10-26 (×2): 2 via ORAL
  Administered 2015-10-26 (×2): 1 via ORAL
  Administered 2015-10-27 (×3): 2 via ORAL
  Administered 2015-10-28 (×2): 1 via ORAL
  Administered 2015-10-28 – 2015-10-30 (×3): 2 via ORAL
  Filled 2015-10-25: qty 2
  Filled 2015-10-25 (×2): qty 1
  Filled 2015-10-25: qty 2
  Filled 2015-10-25 (×2): qty 1
  Filled 2015-10-25 (×2): qty 2
  Filled 2015-10-25: qty 1
  Filled 2015-10-25 (×4): qty 2

## 2015-10-25 MED ORDER — ALPRAZOLAM 0.25 MG PO TABS
0.2500 mg | ORAL_TABLET | Freq: Three times a day (TID) | ORAL | Status: DC | PRN
  Administered 2015-10-25 – 2015-10-29 (×5): 0.25 mg via ORAL
  Filled 2015-10-25 (×5): qty 1

## 2015-10-25 MED ORDER — HEPARIN (PORCINE) IN NACL 100-0.45 UNIT/ML-% IJ SOLN
1600.0000 [IU]/h | INTRAMUSCULAR | Status: DC
  Administered 2015-10-25: 1100 [IU]/h via INTRAVENOUS
  Administered 2015-10-26: 1050 [IU]/h via INTRAVENOUS
  Administered 2015-10-27: 1350 [IU]/h via INTRAVENOUS
  Administered 2015-10-28: 1450 [IU]/h via INTRAVENOUS
  Administered 2015-10-28 – 2015-10-29 (×2): 1600 [IU]/h via INTRAVENOUS
  Filled 2015-10-25 (×6): qty 250

## 2015-10-25 MED ORDER — NITROGLYCERIN 2 % TD OINT
0.5000 [in_us] | TOPICAL_OINTMENT | Freq: Four times a day (QID) | TRANSDERMAL | Status: DC
  Administered 2015-10-25 – 2015-10-26 (×5): 0.5 [in_us] via TOPICAL
  Filled 2015-10-25: qty 30

## 2015-10-25 MED ORDER — ALUM & MAG HYDROXIDE-SIMETH 200-200-20 MG/5ML PO SUSP: 30.0000 mL | ORAL | Status: DC | PRN

## 2015-10-25 MED ORDER — ATORVASTATIN CALCIUM 40 MG PO TABS
40.0000 mg | ORAL_TABLET | Freq: Every day | ORAL | Status: DC
  Administered 2015-10-26 – 2015-10-29 (×5): 40 mg via ORAL
  Filled 2015-10-25 (×5): qty 1

## 2015-10-25 MED ORDER — HEPARIN BOLUS VIA INFUSION
4000.0000 [IU] | Freq: Once | INTRAVENOUS | Status: AC
  Administered 2015-10-25: 4000 [IU] via INTRAVENOUS
  Filled 2015-10-25: qty 4000

## 2015-10-25 MED ORDER — BUPROPION HCL ER (XL) 150 MG PO TB24
150.0000 mg | ORAL_TABLET | Freq: Every day | ORAL | Status: DC
  Administered 2015-10-27 – 2015-10-29 (×3): 150 mg via ORAL
  Filled 2015-10-25 (×5): qty 1

## 2015-10-25 MED ORDER — ACETAMINOPHEN 325 MG PO TABS: 650.0000 mg | ORAL_TABLET | ORAL | Status: DC | PRN

## 2015-10-25 MED ORDER — ONDANSETRON HCL 4 MG/2ML IJ SOLN: 4.0000 mg | Freq: Four times a day (QID) | INTRAMUSCULAR | Status: DC | PRN

## 2015-10-25 MED ORDER — CLOPIDOGREL BISULFATE 75 MG PO TABS
75.0000 mg | ORAL_TABLET | Freq: Every day | ORAL | Status: DC
  Administered 2015-10-26 – 2015-10-30 (×5): 75 mg via ORAL
  Filled 2015-10-25 (×5): qty 1

## 2015-10-25 MED ORDER — CARVEDILOL 6.25 MG PO TABS
6.2500 mg | ORAL_TABLET | Freq: Two times a day (BID) | ORAL | Status: DC
  Administered 2015-10-25 – 2015-10-30 (×8): 6.25 mg via ORAL
  Filled 2015-10-25 (×9): qty 1

## 2015-10-25 MED ORDER — LISINOPRIL 2.5 MG PO TABS
2.5000 mg | ORAL_TABLET | Freq: Every day | ORAL | Status: DC
  Administered 2015-10-26 – 2015-10-30 (×5): 2.5 mg via ORAL
  Filled 2015-10-25 (×5): qty 1

## 2015-10-25 MED ORDER — FUROSEMIDE 40 MG PO TABS
40.0000 mg | ORAL_TABLET | Freq: Every day | ORAL | Status: DC
  Administered 2015-10-26 – 2015-10-30 (×4): 40 mg via ORAL
  Filled 2015-10-25 (×4): qty 1

## 2015-10-25 NOTE — H&P (Signed)
History & Physical    Patient ID: [name] MRN: [id number], DOB/AGE: [birth year]   Admit date: 10/25/2015   Primary Physician: Pcp Not In System Primary Cardiologist: Dr. Jens [name]   Patient Profile    60 yo malewith a history of CAD s/p multiple PCIs, ICM (EF 15%)/chronic systolic CHF, HTN, HLD, TIA, tobacco abuse and h/o VT s/p Medtronic ICD who presented to the [facility] ED with reports of SSCP that started this morning around 7am.   Past Medical History    Past Medical History:  Diagnosis Date  . Anxiety   . Automatic implantable cardioverter-defibrillator in situ    a. Medtronic ICD.  [name] CAD (coronary artery disease)    a.  Severe LAD stenosis 2/2 acute thrombus - BMS 2009;  b. 06/01/11 Cath - LAD 20 isr, 40m (3.0x16 Veri-flex BMS), OM1 100p, EF 20-25%;  c. 07/2012 Abnl Cardiolite;  d. 08/2012 Cath/PCI: LM nl, LAD patent stents, D2 80-90 (jailed), LCX 90 p/m (4.0x24 Promus Premier DES), OM1 100, OM2 80-90p (3.0x20 Promus Premier DES), RCA patent mid stent, 40d, EF 25%.  . Chronic systolic CHF (congestive heart failure) (HCC)   . Depression   . History of rheumatic fever 1962  . HLD (hyperlipidemia) mixed  . HTN (hypertension)   . Ischemic cardiomyopathy    a.  EF 30-35% 2010;  b.  EF 20-25% by LV gram 08/2012. c. EF 15% by echo 11/2014.  [name] Myocardial infarction [facility]) 1998; 2002; ~ 2010  . Paroxysmal ventricular tachycardia (HCC)    a. VFlutter  CL 210 msec  Rx shock 04/2011  . PVC's (premature ventricular contractions)   . RBBB   . TIA (transient ischemic attack)    a. Dx 11/2014, continued on ASA/Plavix.    Past Surgical History:  Procedure Laterality Date  . CARDIAC CATHETERIZATION     "lots of those" (08/29/2012)  . CARDIAC DEFIBRILLATOR PLACEMENT     primary prevention for sudden death; Dr. Graciela [name]   . CORONARY ANGIOPLASTY WITH STENT PLACEMENT  2002-08/29/2012   "think I had 4 total then 2 today" to LAD 06/01/2011/notes (08/29/2012)  . defibrillator implantation  04/12/02   primary prevention for sudden death; Dr. Graciela [name]   . HERNIA REPAIR     "w/gallbladder OR" (08/29/2012)  . LAPAROSCOPIC CHOLECYSTECTOMY    . LEFT HEART CATHETERIZATION WITH CORONARY ANGIOGRAM N/A 06/08/2011   Procedure: LEFT HEART CATHETERIZATION WITH CORONARY ANGIOGRAM;  Surgeon: [name] Patty, MD;  Location: [facility] CATH LAB;  Service: Cardiovascular;  Laterality: N/A;  . PERCUTANEOUS CORONARY STENT INTERVENTION (PCI-S) N/A 06/01/2011   Procedure: PERCUTANEOUS CORONARY STENT INTERVENTION (PCI-S);  Surgeon: [name] M Swaziland, MD;  Location: [facility] CATH LAB;  Service: Cardiovascular;  Laterality: N/A;  . PERCUTANEOUS CORONARY STENT INTERVENTION (PCI-S) N/A 08/29/2012   Procedure: PERCUTANEOUS CORONARY STENT INTERVENTION (PCI-S);  Surgeon: [name] M Swaziland, MD;  Location: [facility] CATH LAB;  Service: Cardiovascular;  Laterality: N/A;     Allergies  No Known Allergies  History of Present Illness    [name] is a 60 yo male patient of Dr. Jens [name] and Dr. Graciela [name] with PMH of CAD s/p multiple PCIs, ICM (EF 15%)/chronic systolic CHF, HTN, HLD, TIA, tobacco abuse and h/o VT s/p Medtronic ICD. His last cardiac cath in 7/14 showednormal left main, patent stents in the LAD with an 80-90% second diagonal (jailed), 90% proximal circumflex, occluded first obtuse marginal, 80-90% second obtuse marginal and ejection fraction 25%. Patient had PCI of his circumflex and second obtuse marginal at that  time. He was later admitted on 10/16 with aphasia and confusion, felt likely to be a TIA. Neurology at that time recommended anticoagulation with coumadin, but this was discussed with cardiology who felt he would not a good candidate given his poor compliance. Device did not show any sign of atrial fibrillation.   Last 2D echo 10/16 showed EF 15%, grade 2 DD, high ventricular filling pressures, multiple WMA, mild-mod AI, mod MR, mildly dilated RA, severely dilated LA.   He was admitted 4/26-4/28/17 for acute on chronic CHF and diuresed  inpatient with his discharge weight was 200lbs.He followed up in the office with [name] Demark PA, no medication changes were made at that time. He was last seen in the office on 5/24 by [name] and reported sporadic SOB but with improvement. His lasix was recently increased prior to his office visit when he called reporting increased SOB, to 80mg  daily.   He was recently seen in the [facility] ED on 8/25 with reports of SSCP that had been continuous for the past 24 hours prior to presenting to the ED. EKG at that time showed known SR with RBBB with 1st degree AVB, and LAFB with no acute ST/T wave changes. His enzymes at that time were negative. CTA showed no PE. His pain at that time was not felt to be cardiac in nature. He was discharged home with a Rx for robaxin, OTC NSAIDS and 10 tablets of Xanax.   Reports he had been feeling well since discharged from the ER previously. States that he actually returned to work on Wednesday and Thursday of this week and felt well. States he was getting up to potentially go to work this morning and developed sudden onset of SSCP, shortness of breath and diaphoresis. States this feels very similar to his episode that he was seen in the ER with previously. Reports that he is concerned that he is having "panic attacks", but is unsure of why he is having them. His troponin was negative while at [place]. His EKG showed known right bundle-branch block with first-degree AV block, and left anterior vesicular block. States he was given 3 SL nitro and 2 doses of morphine which did bring his pain from 8/10-3/10. Chest x-ray there was negative for edema. Labs showed normal electrolytes, pro-BNP 4730, and stable hemoglobin. His case was discussed with the cardiology group and decision was made to transfer to call for further workup.  Home Medications    Prior to Admission medications   Medication Sig Start Date End Date Taking? Authorizing Provider  ALPRAZolam (XANAX)  0.25 MG tablet Take 1 tablet (0.25 mg total) by mouth 3 (three) times daily as needed for anxiety. 10/18/15   Loren Racer, MD  atorvastatin (LIPITOR) 40 MG tablet Take 1 tablet (40 mg total) by mouth daily at 6 PM. 03/29/15   [name], MD  buPROPion (WELLBUTRIN XL) 150 MG 24 hr tablet Take 150 mg daily for 3 days then 150 mg twice a day 07/18/15   Janetta Hora, PA-C  carvedilol (COREG) 3.125 MG tablet Take 1 tablet (3.125 mg total) by mouth 2 (two) times daily with a meal. 03/29/15   [name], MD  clopidogrel (PLAVIX) 75 MG tablet Take 1 tablet (75 mg total) by mouth daily. Take one table by mouth everyday with breakfast. Patient taking differently: Take 75 mg by mouth daily.  03/29/15   [name], MD  clotrimazole-betamethasone (LOTRISONE) cream Apply 1 application topically 2 (two) times  daily. Patient not taking: Reported on 10/18/2015 06/27/15   Joline Salt Barrett, PA-C  furosemide (LASIX) 40 MG tablet Take 40 mg by mouth daily.     Historical Provider, MD  ibuprofen (ADVIL,MOTRIN) 600 MG tablet Take 1 tablet (600 mg total) by mouth every 6 (six) hours as needed for moderate pain. 10/18/15   Loren Racer, MD  lisinopril (PRINIVIL,ZESTRIL) 2.5 MG tablet Take 2.5 mg by mouth daily.    Historical Provider, MD  lisinopril (PRINIVIL,ZESTRIL) 5 MG tablet Take 1 tablet (5 mg total) by mouth daily. Patient not taking: Reported on 10/18/2015 06/21/15   Arty Baumgartner, NP  methocarbamol (ROBAXIN) 500 MG tablet Take 2 tablets (1,000 mg total) by mouth every 8 (eight) hours as needed. 10/18/15   Loren Racer, MD  nitroGLYCERIN (NITROSTAT) 0.4 MG SL tablet Place 1 tablet (0.4 mg total) under the tongue every 5 (five) minutes as needed. For chest pain 03/29/15   [name], MD  sildenafil (VIAGRA) 50 MG tablet Take 1 tablet (50 mg total) by mouth daily as needed for erectile dysfunction. Patient not taking: Reported on 10/18/2015 04/09/15   Joline Salt Barrett, PA-C    Family History     Family History  Problem Relation Age of Onset  . Cancer Father   . Cancer Sister     twin sister and only one has cancer, unknown what kind  . Hypertension Brother   . Cancer      family hx  . Heart attack Neg Hx   . Stroke Neg Hx     Social History    Social History   Social History  . Marital status: Married    Spouse name: N/A  . Number of children: N/A  . Years of education: N/A   Occupational History  . Not on file.   Social History Main Topics  . Smoking status: Current Every Day Smoker    Packs/day: 0.50    Years: 44.00    Types: Cigarettes    Last attempt to quit: 05/27/2011  . Smokeless tobacco: Never Used  . Alcohol use No     Comment: 08/29/2012 "6-8 beers once/wk". Has not had alcohol in the last 6-8 months  . Drug use: No  . Sexual activity: Not Currently   Other Topics Concern  . Not on file   Social History Narrative   Married; full time.      Review of Systems    General:  No chills, fever, night sweats or weight changes.  Cardiovascular:  See HPI Dermatological: No rash, lesions/masses Respiratory: No cough, dyspnea Urologic: No hematuria, dysuria Abdominal:   No nausea, vomiting, diarrhea, bright red blood per rectum, melena, or hematemesis Neurologic:  No visual changes, wkns, changes in mental status. All other systems reviewed and are otherwise negative except as noted above.  Physical Exam    Blood pressure 106/74, pulse 95, temperature 97.7 F (36.5 C), temperature source Oral, resp. rate 13, height 5\' 11"  (1.803 m), weight 190 lb 7.6 oz (86.4 kg), SpO2 98 %.  General: Pleasant but slightly anxious caucasian male, NAD Psych: Normal affect. Neuro: Alert and oriented X 3. Moves all extremities spontaneously. HEENT: Normal  Neck: Supple without bruits or JVD. Lungs:  Resp regular and unlabored, CTA. Heart: RRR no s3, s4, or murmurs. Abdomen: Soft, non-tender, non-distended, BS + x 4.  Extremities: No clubbing, cyanosis or edema.  DP/PT/Radials 2+ and equal bilaterally.  Labs    Troponin [facility] of Care Test) No results  for input(s): TROPIPOC in the last 72 hours. No results for input(s): CKTOTAL, CKMB, TROPONINI in the last 72 hours. Lab Results  Component Value Date   WBC 10.2 10/18/2015   HGB 13.5 10/18/2015   HCT 41.4 10/18/2015   MCV 95.0 10/18/2015   PLT 245 10/18/2015   No results for input(s): NA, K, CL, CO2, BUN, CREATININE, CALCIUM, PROT, BILITOT, ALKPHOS, ALT, AST, GLUCOSE in the last 168 hours.  Invalid input(s): LABALBU Lab Results  Component Value Date   CHOL 183 12/09/2014   HDL 41 12/09/2014   LDLCALC 130 (H) 12/09/2014   TRIG 61 12/09/2014   Lab Results  Component Value Date   DDIMER 0.92 (H) 10/18/2015     Radiology Studies      ECG & Cardiac Imaging    EKG: RBBB, with 1st degree AVB and LAFB, no acute ST/T wave changes  Echo: 12/09/2014  Study Conclusions  - Left ventricle: Systolic function is severely reduced, estimated EF 15%. The cavity size was moderately dilated. Features are consistent with a pseudonormal left ventricular filling pattern, with concomitant abnormal relaxation and increased filling pressure (grade 2 diastolic dysfunction). Doppler parameters are consistent with high ventricular filling pressure. - Regional wall motion abnormality: Akinesis of the mid-apical anterior, apical inferior, mid anterolateral, apical lateral, and apical myocardium; severe hypokinesis of the mid anteroseptal, mid inferior, and apical septal myocardium; moderate hypokinesis of the basal anteroseptal, basal inferior, and basal-mid inferolateral myocardium. - Aortic valve: Mildly calcified annulus. Trileaflet. There was mild to moderate regurgitation. - Mitral valve: There was moderate regurgitation. - Left atrium: The atrium was severely dilated. Volume/bsa, ES, (1-plane Simpson&'s, A2C): 48.1 ml/m^2. - Right ventricle: Pacer wire or catheter noted  in right ventricle. Systolic function was mildly reduced. - Right atrium: The atrium was mildly dilated.  Assessment & Plan    60 yo malewith a history of CAD s/p multiple PCIs, ICM (EF 15%)/chronic systolic CHF, HTN, HLD, TIA, tobacco abuse and h/o VT s/p Medtronic ICD who presented to the [facility] ED with reports of SSCP that started this morning around 7am.   1. Unstable Angina: Was recently seen in the ED at [facility] last week for similar symptoms. CTA was negative for PE at that time. States he was waking up for work today and developed a sudden onset of SSCP, SOB and diaphoresis. Presented to [facility], enzymes were negative, EKG showed known RBBB, with 1st degree AVB and LAFB, but no acute ST/T wave changes. He was 3 SL nitro and 2 doses of morphine which he reports helped ease the pain slightly. Given his cardiac hx, the decision was made to transfer him to [facility] for further management. Appears at his last work up was back in 2014 when he was cathed and received a DES to the RCA.  -- In talking with the patient he reports to me he is concerned that he may be having panic attacks, but is unsure of why. Also reports being on plavix and ASA, but missing freq doses at home. Reports chest pain is not specifically related to exertion or rest.   -- Will continue to cycle enzymes, start IV heparin and nitro paste. May ultimately need LHC to redefine anatomy.   2. HTN: Controlled, continue lisinopril, increase 6.25mg  coreg and monitor BP.   3. HLD:  On statin  4. Chronic systolic CHF/EF 15% PPM: Does not appear fluid overloaded on exam. Renal function is stable. Will continue home dose of 40mg  lasix daily. Follow BMET  Janice CoffinSigned, Lindsay Roberts, NP-C Pager [phone] 10/25/2015, 3:32 PM   History and all data above reviewed.  Patient examined.  I agree with the findings as above.  The patient presents with pain.  It is severe and mid sternal.  It seems to be worse with inspiration.   He had this pain recently when he was admitted but it did go away until this AM.  He woke and noted it.  It did not go away with NTG.  His EKG is paced.  The pain is atypical but similar to pain he had in 2014.  I reviewed these cath films and at that time he did have severe circ disease that was treated.   The patient exam reveals COR:RRR, no rub  ,  Lungs: Clear  ,  Abd: Positive bowel sounds, no rebound no guarding, Ext No edema  .  All available labs, radiology testing, previous records reviewed. Agree with documented assessment and plan. Chest pain:  I will suggest IV heparin and start with NTG paste. Increase beta blocker (apparently has had low BPs in the past)  Needs cath electively on Tuesday.   Cardiomyopathy:  Seems to be euvolemic.  Continue current therapy.      Fayrene FearingJames Azarius Lambson  5:30 PM  10/25/2015

## 2015-10-25 NOTE — Progress Notes (Addendum)
R. Barrett PA made aware of patient's continued complaints of "sharp" epigastric chest pain. No EKG changes, vital signs stable. He is refusing to take Tylenol- stating "that won't do crap." Also made aware of request for home Xanax. Orders received. Will continue to closely monitor. Thresa RossA. Uri Covey RN

## 2015-10-25 NOTE — Progress Notes (Signed)
ANTICOAGULATION CONSULT NOTE - Initial Consult  Pharmacy Consult for heparin Indication: chest pain/ACS  No Known Allergies  Patient Measurements: Height: 5\' 11"  (180.3 cm) Weight: 190 lb 7.6 oz (86.4 kg) IBW/kg (Calculated) : 75.3 Heparin Dosing Weight: 86 kg  Vital Signs: Temp: 97.5 F (36.4 C) (09/01 1630) Temp Source: Oral (09/01 1630) BP: 120/94 (09/01 1630) Pulse Rate: 107 (09/01 1630)  Labs: No results for input(s): HGB, HCT, PLT, APTT, LABPROT, INR, HEPARINUNFRC, HEPRLOWMOCWT, CREATININE, CKTOTAL, CKMB, TROPONINI in the last 72 hours.  Estimated Creatinine Clearance: 67.4 mL/min (by C-G formula based on SCr of 1.21 mg/dL).   Medical History: Past Medical History:  Diagnosis Date  . Anxiety   . Automatic implantable cardioverter-defibrillator in situ    a. Medtronic ICD.  [name]. CAD (coronary artery disease)    a.  Severe LAD stenosis 2/2 acute thrombus - BMS 2009;  b. 06/01/11 Cath - LAD 20 isr, 6529m (3.0x16 Veri-flex BMS), OM1 100p, EF 20-25%;  c. 07/2012 Abnl Cardiolite;  d. 08/2012 Cath/PCI: LM nl, LAD patent stents, D2 80-90 (jailed), LCX 90 p/m (4.0x24 Promus Premier DES), OM1 100, OM2 80-90p (3.0x20 Promus Premier DES), RCA patent mid stent, 40d, EF 25%.  . Chronic systolic CHF (congestive heart failure) (HCC)   . Depression   . History of rheumatic fever 1962  . HLD (hyperlipidemia) mixed  . HTN (hypertension)   . Ischemic cardiomyopathy    a.  EF 30-35% 2010;  b.  EF 20-25% by LV gram 08/2012. c. EF 15% by echo 11/2014.  [name]. Myocardial infarction [facility](HCC) 1998; 2002; ~ 2010  . Paroxysmal ventricular tachycardia (HCC)    a. VFlutter  CL 210 msec  Rx shock 04/2011  . PVC's (premature ventricular contractions)   . RBBB   . TIA (transient ischemic attack)    a. Dx 11/2014, continued on ASA/Plavix.    Assessment: [age] YOM [facility]. Pharmacy is consulted to start IV heparin. Most recent CBC 8/25 is wnl, not on anticoagulation PTA.    Goal of Therapy:  Heparin level 0.3-0.7 units/ml Monitor platelets by anticoagulation protocol: Yes   Plan:  Heparin bolus 4000 units x 1 Heparin infusion 1100 units/hr F/u 6 hr heparin level at midnight Daily heparin level and CBC  Riki RuskBell, Harley Mccartney Wang 10/25/2015,4:47 PM

## 2015-10-26 ENCOUNTER — Encounter (HOSPITAL_COMMUNITY): Payer: Self-pay | Admitting: General Practice

## 2015-10-26 DIAGNOSIS — I255 Ischemic cardiomyopathy: Secondary | ICD-10-CM

## 2015-10-26 DIAGNOSIS — I25110 Atherosclerotic heart disease of native coronary artery with unstable angina pectoris: Principal | ICD-10-CM

## 2015-10-26 LAB — BASIC METABOLIC PANEL
Anion gap: 8 (ref 5–15)
BUN: 16 mg/dL (ref 6–20)
CO2: 24 mmol/L (ref 22–32)
CREATININE: 1.23 mg/dL (ref 0.61–1.24)
Calcium: 8.9 mg/dL (ref 8.9–10.3)
Chloride: 100 mmol/L — ABNORMAL LOW (ref 101–111)
GFR calc Af Amer: >60 mL/min (ref >60)
GLUCOSE: 98 mg/dL (ref 65–99)
POTASSIUM: 4.2 mmol/L (ref 3.5–5.1)
Sodium: 132 mmol/L — ABNORMAL LOW (ref 135–145)

## 2015-10-26 LAB — CBC
HEMATOCRIT: 39.8 % (ref 39.0–52.0)
HEMOGLOBIN: 12.5 g/dL — AB (ref 13.0–17.0)
MCH: 30.2 pg (ref 26.0–34.0)
MCHC: 31.4 g/dL (ref 30.0–36.0)
MCV: 96.1 fL (ref 78.0–100.0)
Platelets: 300 10*3/uL (ref 150–400)
RBC: 4.14 MIL/uL — ABNORMAL LOW (ref 4.22–5.81)
RDW: 14.4 % (ref 11.5–15.5)
WBC: 8.7 10*3/uL (ref 4.0–10.5)

## 2015-10-26 LAB — HEPARIN LEVEL (UNFRACTIONATED)
HEPARIN UNFRACTIONATED: 0.71 [IU]/mL — AB (ref 0.30–0.70)
Heparin Unfractionated: 0.43 IU/mL (ref 0.30–0.70)

## 2015-10-26 LAB — TROPONIN I

## 2015-10-26 NOTE — Progress Notes (Signed)
Primary cardiologist: Dr. Olga MillersBrian Crenshaw  Seen for followup: Chest pain, shortness of breath  Subjective:    Patient states he had some mild lower right thoracic discomfort earlier this morning. Interestingly, also states that he felt much better after swallowing some food this morning and feeling as if things "passed through." States he has been able to breathe more easily since then.  Objective:   Temp:  [97.5 F (36.4 C)-98.6 F (37 C)] 98.2 F (36.8 C) (09/02 1124) Pulse Rate:  [43-130] 87 (09/02 1124) Resp:  [13-35] 22 (09/02 1124) BP: (87-130)/(54-99) 92/61 (09/02 1124) SpO2:  [89 %-100 %] 95 % (09/02 1124) Weight:  [190 lb 7.6 oz (86.4 kg)] 190 lb 7.6 oz (86.4 kg) (09/01 1445)    Filed Weights   10/25/15 1445  Weight: 190 lb 7.6 oz (86.4 kg)    Intake/Output Summary (Last 24 hours) at 10/26/15 1217 Last data filed at 10/26/15 0600  Gross per 24 hour  Intake           136.71 ml  Output              825 ml  Net          -688.29 ml    Telemetry: Sinus rhythm with PVCs and burst of NSVT.  Exam:  General: No distress.  Lungs: Clear, nonlabored.  Cardiac: RRR, indistinct PMI without gallop.  Abdomen: NABS.  Extremities: No pitting edema.  Lab Results:  Basic Metabolic Panel:  Recent Labs Lab 10/26/15 0250  NA 132*  K 4.2  CL 100*  CO2 24  GLUCOSE 98  BUN 16  CREATININE 1.23  CALCIUM 8.9    CBC:  Recent Labs Lab 10/26/15 0250  WBC 8.7  HGB 12.5*  HCT 39.8  MCV 96.1  PLT 300    Cardiac Enzymes:  Recent Labs Lab 10/25/15 1655 10/25/15 2330 10/26/15 0250  TROPONINI <0.03 <0.03 <0.03   Echocardiogram 12/09/2014: Study Conclusions  - Left ventricle: Systolic function is severely reduced, estimated   EF 15%. The cavity size was moderately dilated. Features are   consistent with a pseudonormal left ventricular filling pattern,   with concomitant abnormal relaxation and increased filling   pressure (grade 2 diastolic  dysfunction). Doppler parameters are   consistent with high ventricular filling pressure. - Regional wall motion abnormality: Akinesis of the mid-apical   anterior, apical inferior, mid anterolateral, apical lateral, and   apical myocardium; severe hypokinesis of the mid anteroseptal,   mid inferior, and apical septal myocardium; moderate hypokinesis   of the basal anteroseptal, basal inferior, and basal-mid   inferolateral myocardium. - Aortic valve: Mildly calcified annulus. Trileaflet. There was   mild to moderate regurgitation. - Mitral valve: There was moderate regurgitation. - Left atrium: The atrium was severely dilated. Volume/bsa, ES,   (1-plane Simpson&'s, A2C): 48.1 ml/m^2. - Right ventricle: Pacer wire or catheter noted in right ventricle.   Systolic function was mildly reduced. - Right atrium: The atrium was mildly dilated.   Medications:   Scheduled Medications: . aspirin EC  81 mg Oral Daily  . atorvastatin  40 mg Oral q1800  . buPROPion  150 mg Oral Daily  . carvedilol  6.25 mg Oral BID WC  . clopidogrel  75 mg Oral Daily  . furosemide  40 mg Oral Daily  . lisinopril  2.5 mg Oral Daily  . nitroGLYCERIN  0.5 inch Topical Q6H     Infusions: . heparin 1,050 Units/hr (10/26/15 0023)  PRN Medications:  acetaminophen, ALPRAZolam, alum & mag hydroxide-simeth, HYDROcodone-acetaminophen, ondansetron (ZOFRAN) IV   Assessment:   1. Chest pain with typical and atypical features, description of symptoms this morning almost sounded more GI in etiology. He does however have substantial cardiac history as detailed in the admission note. He has ruled out for ACS by troponin I levels and has been placed on heparin with plan for cardiac catheterization early this week per Dr. Antoine Poche.  2. 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  3. Ischemic cardiomyopathy with  LVEF approximately 15% by echocardiogram in October 2016.  4. NSVT by monitor. Patient has Medtronic ICD in place. No obvious device shocks recently.  5. Essential hypertension, blood pressure mildly decreased to low normal at this time.  6. Hyperlipidemia, on statin therapy.   Plan/Discussion:    Continue current regimen including aspirin, Plavix, Coreg, Lipitor, lisinopril, Lasix, and heparin. Also has nitroglycerin paste in place. Does report improvement in symptoms earlier this morning with description that sounded more GI in etiology, however with concurrent anti-ischemic regimen also in place, cardiac etiology is not excluded. Plan is for cardiac catheterization early this week.   Jonelle Sidle, M.D., F.A.C.C.

## 2015-10-26 NOTE — Progress Notes (Signed)
ANTICOAGULATION CONSULT NOTE - Follow Up Consult  Pharmacy Consult for Heparin Indication: chest pain/ACS  No Known Allergies  Patient Measurements: Height: 5\' 11"  (180.3 cm) Weight: 190 lb 7.6 oz (86.4 kg) IBW/kg (Calculated) : 75.3  Vital Signs: Temp: 98.1 F (36.7 C) (09/02 0800) Temp Source: Oral (09/02 0800) BP: 94/64 (09/02 0800) Pulse Rate: 90 (09/02 0800)  Labs:  Recent Labs  10/25/15 1655 10/25/15 2330 10/26/15 0250 10/26/15 0743  HGB  --   --  12.5*  --   HCT  --   --  39.8  --   PLT  --   --  300  --   HEPARINUNFRC  --  0.71*  --  0.43  CREATININE  --   --  1.23  --   TROPONINI <0.03 <0.03 <0.03  --     Estimated Creatinine Clearance: 66.3 mL/min (by C-G formula based on SCr of 1.23 mg/dL).  Assessment: 60yo M with hx of CAD s/p multiple PCI here with w/o of chest pain. Pharmacy consulted to dose heparin during work up. May ultimately need cath. Hgb 12.5, PLT 300. No issues with line reported. Heparin level is therapeutic.  Goal of Therapy:  Heparin level 0.3-0.7 units/ml Monitor platelets by anticoagulation protocol: Yes   Plan:  -Continue heparin 1050 units/hr -Daily heparin level and CBC -Monitor for signs and symptoms of bleeding  Sherron MondayAubrey N. Ajani Schnieders, PharmD Clinical Pharmacy Resident Pager: [phone] 10/26/15 8:26 AM

## 2015-10-26 NOTE — Progress Notes (Signed)
ANTICOAGULATION CONSULT NOTE - Follow Up Consult  Pharmacy Consult for Heparin Indication: chest pain/ACS  No Known Allergies  Patient Measurements: Height: 5\' 11"  (180.3 cm) Weight: 190 lb 7.6 oz (86.4 kg) IBW/kg (Calculated) : 75.3  Vital Signs: Temp: 98.6 F (37 C) (09/01 1946) Temp Source: Oral (09/01 1946) BP: 95/63 (09/01 2300) Pulse Rate: 85 (09/01 2300)  Labs:  Recent Labs  10/25/15 1655 10/25/15 2330  HEPARINUNFRC  --  0.71*  TROPONINI <0.03 <0.03    Estimated Creatinine Clearance: 67.4 mL/min (by C-G formula based on SCr of 1.21 mg/dL).   Assessment: Undergoing cardiac work-up, may need cath, on heparin, first HL is slightly elevated  Goal of Therapy:  Heparin level 0.3-0.7 units/ml Monitor platelets by anticoagulation protocol: Yes   Plan:  -Dec heparin to 1050 units/hr -0800 HL  Abran DukeLedford, Mushka Laconte 10/26/2015,12:24 AM

## 2015-10-26 NOTE — Progress Notes (Addendum)
Md notified of pt's sbp 70-80's.  Removed NTG patch ointment at this time  Will leave off until am.  Will continue to monitor Karena Addisonoro, Stephaine Breshears T

## 2015-10-27 ENCOUNTER — Encounter (HOSPITAL_COMMUNITY): Payer: Self-pay | Admitting: *Deleted

## 2015-10-27 LAB — BASIC METABOLIC PANEL
ANION GAP: 7 (ref 5–15)
BUN: 22 mg/dL — ABNORMAL HIGH (ref 6–20)
CHLORIDE: 98 mmol/L — AB (ref 101–111)
CO2: 27 mmol/L (ref 22–32)
Calcium: 8.9 mg/dL (ref 8.9–10.3)
Creatinine, Ser: 1.37 mg/dL — ABNORMAL HIGH (ref 0.61–1.24)
GFR calc Af Amer: >60 mL/min (ref >60)
GFR calc non Af Amer: 54 mL/min — ABNORMAL LOW (ref >60)
GLUCOSE: 97 mg/dL (ref 65–99)
POTASSIUM: 4 mmol/L (ref 3.5–5.1)
Sodium: 132 mmol/L — ABNORMAL LOW (ref 135–145)

## 2015-10-27 LAB — CBC
HEMATOCRIT: 36.5 % — AB (ref 39.0–52.0)
HEMOGLOBIN: 11.6 g/dL — AB (ref 13.0–17.0)
MCH: 30.3 pg (ref 26.0–34.0)
MCHC: 31.8 g/dL (ref 30.0–36.0)
MCV: 95.3 fL (ref 78.0–100.0)
Platelets: 279 10*3/uL (ref 150–400)
RBC: 3.83 MIL/uL — ABNORMAL LOW (ref 4.22–5.81)
RDW: 14.2 % (ref 11.5–15.5)
WBC: 7.1 10*3/uL (ref 4.0–10.5)

## 2015-10-27 LAB — HEPARIN LEVEL (UNFRACTIONATED)
HEPARIN UNFRACTIONATED: 0.29 [IU]/mL — AB (ref 0.30–0.70)
Heparin Unfractionated: 0.11 IU/mL — ABNORMAL LOW (ref 0.30–0.70)
Heparin Unfractionated: 0.33 IU/mL (ref 0.30–0.70)

## 2015-10-27 MED ORDER — HEPARIN BOLUS VIA INFUSION
2000.0000 [IU] | Freq: Once | INTRAVENOUS | Status: AC
  Administered 2015-10-27: 2000 [IU] via INTRAVENOUS
  Filled 2015-10-27: qty 2000

## 2015-10-27 NOTE — Progress Notes (Signed)
ANTICOAGULATION CONSULT NOTE Pharmacy Consult for Heparin Indication: chest pain/ACS  No Known Allergies  Patient Measurements: Height: 5\' 11"  (180.3 cm) Weight: 190 lb 7.6 oz (86.4 kg) IBW/kg (Calculated) : 75.3  Vital Signs: Temp: 97.6 F (36.4 C) (09/03 0300) Temp Source: Oral (09/03 0300) BP: 96/74 (09/03 0300) Pulse Rate: 80 (09/03 0300)  Labs:  Recent Labs  10/25/15 1655 10/25/15 2330 10/26/15 0250 10/26/15 0743 10/27/15 0237 10/27/15 0256  HGB  --   --  12.5*  --  11.6*  --   HCT  --   --  39.8  --  36.5*  --   PLT  --   --  300  --  279  --   HEPARINUNFRC  --  0.71*  --  0.43  --  0.11*  CREATININE  --   --  1.23  --  1.37*  --   TROPONINI <0.03 <0.03 <0.03  --   --   --     Estimated Creatinine Clearance: 59.5 mL/min (by C-G formula based on SCr of 1.37 mg/dL).   Assessment: 60 y.o. male with chest pain for heparin   Goal of Therapy:  Heparin level 0.3-0.7 units/ml Monitor platelets by anticoagulation protocol: Yes   Plan:  Heparin 2000 units IV bolus, then increase heparin 1350 units/hr Check heparin level in 6 hours.    Eddie Candlebbott, Kimbrely Buckel Vernon 10/27/2015,4:01 AM

## 2015-10-27 NOTE — Progress Notes (Signed)
ANTICOAGULATION CONSULT NOTE - Follow Up Consult  Pharmacy Consult for Heparin Indication: chest pain/ACS  No Known Allergies  Patient Measurements: Height: 5\' 11"  (180.3 cm) Weight: 190 lb 7.6 oz (86.4 kg) IBW/kg (Calculated) : 75.3  Vital Signs: Temp: 97.4 F (36.3 C) (09/03 0800) Temp Source: Oral (09/03 0800) BP: 96/74 (09/03 0300) Pulse Rate: 80 (09/03 0300)  Labs:  Recent Labs  10/25/15 1655  10/25/15 2330 10/26/15 0250 10/26/15 0743 10/27/15 0237 10/27/15 0256 10/27/15 0854  HGB  --   --   --  12.5*  --  11.6*  --   --   HCT  --   --   --  39.8  --  36.5*  --   --   PLT  --   --   --  300  --  279  --   --   HEPARINUNFRC  --   < > 0.71*  --  0.43  --  0.11* 0.33  CREATININE  --   --   --  1.23  --  1.37*  --   --   TROPONINI <0.03  --  <0.03 <0.03  --   --   --   --   < > = values in this interval not displayed.  Estimated Creatinine Clearance: 59.5 mL/min (by C-G formula based on SCr of 1.37 mg/dL).  Assessment: 60yo M with hx of CAD s/p multiple PCI here with w/o of chest pain. Pharmacy consulted to dose heparin during work up. May ultimately need cath. Hgb 11.6, PLT 279. No issues with line reported. Heparin level is therapeutic.  Goal of Therapy:  Heparin level 0.3-0.7 units/ml Monitor platelets by anticoagulation protocol: Yes   Plan:  -Continue heparin 1350 units/hr -Daily heparin level and CBC -Monitor for signs and symptoms of bleeding  Sherron MondayAubrey N. Amarri Michaelson, PharmD Clinical Pharmacy Resident Pager: [phone] 10/27/15 10:20 AM

## 2015-10-27 NOTE — Progress Notes (Signed)
ANTICOAGULATION CONSULT NOTE - Follow Up Consult  Pharmacy Consult for Heparin Indication: chest pain/ACS  Assessment: 60yo M with hx of CAD s/p multiple PCI here with w/o of chest pain. Pharmacy consulted to dose heparin during work up.   Heparin level just below goal at 0.29, will adjust rate and follow up level in am.  Goal of Therapy:  Heparin level 0.3-0.7 units/ml Monitor platelets by anticoagulation protocol: Yes   Plan:  -Increase heparin to 1450 units/hr -Daily heparin level and CBC -Monitor for signs and symptoms of bleeding  No Known Allergies  Patient Measurements: Height: 5\' 11"  (180.3 cm) Weight: 190 lb 7.6 oz (86.4 kg) IBW/kg (Calculated) : 75.3  Vital Signs: Temp: 97.5 F (36.4 C) (09/03 1500) Temp Source: Oral (09/03 1500) BP: 96/65 (09/03 1253) Pulse Rate: 78 (09/03 1253)  Labs:  Recent Labs  10/25/15 1655 10/25/15 2330 10/26/15 0250  10/27/15 0237 10/27/15 0256 10/27/15 0854 10/27/15 1813  HGB  --   --  12.5*  --  11.6*  --   --   --   HCT  --   --  39.8  --  36.5*  --   --   --   PLT  --   --  300  --  279  --   --   --   HEPARINUNFRC  --  0.71*  --   < >  --  0.11* 0.33 0.29*  CREATININE  --   --  1.23  --  1.37*  --   --   --   TROPONINI <0.03 <0.03 <0.03  --   --   --   --   --   < > = values in this interval not displayed.  Estimated Creatinine Clearance: 59.5 mL/min (by C-G formula based on SCr of 1.37 mg/dL).   Sheppard CoilFrank Amanada Philbrick PharmD., BCPS Clinical Pharmacist Pager [phone] 10/27/2015 6:43 PM

## 2015-10-27 NOTE — Progress Notes (Signed)
Primary cardiologist: Dr. Olga Millers  Seen for followup: Chest pain, shortness of breath  Subjective:    Had some right lower costal discomfort yesterday. No breathlessness at rest. No palpitations or dizziness.  Objective:   Temp:  [97.6 F (36.4 C)-98.5 F (36.9 C)] 97.6 F (36.4 C) (09/03 0300) Pulse Rate:  [67-94] 80 (09/03 0300) Resp:  [9-24] 17 (09/03 0300) BP: (74-104)/(51-74) 96/74 (09/03 0300) SpO2:  [94 %-100 %] 100 % (09/03 0300) Last BM Date: 10/26/15  Filed Weights   10/25/15 1445  Weight: 190 lb 7.6 oz (86.4 kg)    Intake/Output Summary (Last 24 hours) at 10/27/15 0747 Last data filed at 10/27/15 0600  Gross per 24 hour  Intake           977.81 ml  Output             1600 ml  Net          -622.19 ml    Telemetry: Sinus rhythm with PVCs and recurrent NSVT.  Exam:  General: No distress.  Lungs: Clear, nonlabored.  Cardiac: RRR, indistinct PMI without gallop.  Abdomen: NABS.  Extremities: No pitting edema.  Lab Results:  Basic Metabolic Panel:  Recent Labs Lab 10/26/15 0250 10/27/15 0237  NA 132* 132*  K 4.2 4.0  CL 100* 98*  CO2 24 27  GLUCOSE 98 97  BUN 16 22*  CREATININE 1.23 1.37*  CALCIUM 8.9 8.9    CBC:  Recent Labs Lab 10/26/15 0250 10/27/15 0237  WBC 8.7 7.1  HGB 12.5* 11.6*  HCT 39.8 36.5*  MCV 96.1 95.3  PLT 300 279    Cardiac Enzymes:  Recent Labs Lab 10/25/15 1655 10/25/15 2330 10/26/15 0250  TROPONINI <0.03 <0.03 <0.03   Echocardiogram 12/09/2014: Study Conclusions  - Left ventricle: Systolic function is severely reduced, estimated   EF 15%. The cavity size was moderately dilated. Features are   consistent with a pseudonormal left ventricular filling pattern,   with concomitant abnormal relaxation and increased filling   pressure (grade 2 diastolic dysfunction). Doppler parameters are   consistent with high ventricular filling pressure. - Regional wall motion abnormality: Akinesis of the  mid-apical   anterior, apical inferior, mid anterolateral, apical lateral, and   apical myocardium; severe hypokinesis of the mid anteroseptal,   mid inferior, and apical septal myocardium; moderate hypokinesis   of the basal anteroseptal, basal inferior, and basal-mid   inferolateral myocardium. - Aortic valve: Mildly calcified annulus. Trileaflet. There was   mild to moderate regurgitation. - Mitral valve: There was moderate regurgitation. - Left atrium: The atrium was severely dilated. Volume/bsa, ES,   (1-plane Simpson&'s, A2C): 48.1 ml/m^2. - Right ventricle: Pacer wire or catheter noted in right ventricle.   Systolic function was mildly reduced. - Right atrium: The atrium was mildly dilated.   Medications:   Scheduled Medications: . aspirin EC  81 mg Oral Daily  . atorvastatin  40 mg Oral q1800  . buPROPion  150 mg Oral Daily  . carvedilol  6.25 mg Oral BID WC  . clopidogrel  75 mg Oral Daily  . furosemide  40 mg Oral Daily  . lisinopril  2.5 mg Oral Daily  . nitroGLYCERIN  0.5 inch Topical Q6H    Infusions: . heparin 1,350 Units/hr (10/27/15 0404)    PRN Medications: acetaminophen, ALPRAZolam, alum & mag hydroxide-simeth, HYDROcodone-acetaminophen, ondansetron (ZOFRAN) IV   Assessment:   1. Chest pain with typical and atypical features, He has ruled out for  ACS by troponin I levels and has been placed on heparin with plan for cardiac catheterization early this week per Dr. Antoine PocheHochrein. Recent low blood pressure overnight, nitroglycerin paste discontinued.  2. 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  3. Ischemic cardiomyopathy with LVEF approximately 15% by echocardiogram in October 2016.  4. NSVT by monitor. Patient has Medtronic ICD in place. No obvious device shocks recently.  5. Essential hypertension, blood pressure mildly decreased to low normal at this  time.  6. Hyperlipidemia, on statin therapy.   Plan/Discussion:    Stop nitroglycerin paste. Continue remaining regimen, although if blood pressure remains low may need to hold lisinopril as well. Anticipate cardiac catheterization Tuesday morning.   Jonelle SidleSamuel G. Milarose Savich, M.D., F.A.C.C.

## 2015-10-28 LAB — HEPARIN LEVEL (UNFRACTIONATED)
HEPARIN UNFRACTIONATED: 0.34 [IU]/mL (ref 0.30–0.70)
Heparin Unfractionated: 0.25 IU/mL — ABNORMAL LOW (ref 0.30–0.70)
Heparin Unfractionated: 0.45 IU/mL (ref 0.30–0.70)

## 2015-10-28 LAB — BASIC METABOLIC PANEL
ANION GAP: 7 (ref 5–15)
BUN: 22 mg/dL — ABNORMAL HIGH (ref 6–20)
CO2: 28 mmol/L (ref 22–32)
Calcium: 9 mg/dL (ref 8.9–10.3)
Chloride: 99 mmol/L — ABNORMAL LOW (ref 101–111)
Creatinine, Ser: 1.2 mg/dL (ref 0.61–1.24)
GFR calc non Af Amer: >60 mL/min (ref >60)
GLUCOSE: 105 mg/dL — AB (ref 65–99)
POTASSIUM: 3.8 mmol/L (ref 3.5–5.1)
Sodium: 134 mmol/L — ABNORMAL LOW (ref 135–145)

## 2015-10-28 LAB — CBC
HEMATOCRIT: 34.7 % — AB (ref 39.0–52.0)
HEMOGLOBIN: 10.9 g/dL — AB (ref 13.0–17.0)
MCH: 30 pg (ref 26.0–34.0)
MCHC: 31.4 g/dL (ref 30.0–36.0)
MCV: 95.6 fL (ref 78.0–100.0)
Platelets: 266 10*3/uL (ref 150–400)
RBC: 3.63 MIL/uL — AB (ref 4.22–5.81)
RDW: 14.1 % (ref 11.5–15.5)
WBC: 6.6 10*3/uL (ref 4.0–10.5)

## 2015-10-28 MED ORDER — ASPIRIN 81 MG PO CHEW
81.0000 mg | CHEWABLE_TABLET | ORAL | Status: AC
  Administered 2015-10-29: 81 mg via ORAL
  Filled 2015-10-28: qty 1

## 2015-10-28 MED ORDER — SODIUM CHLORIDE 0.9% FLUSH: 3.0000 mL | INTRAVENOUS | Status: DC | PRN

## 2015-10-28 MED ORDER — SODIUM CHLORIDE 0.9 % IV SOLN: 250.0000 mL | INTRAVENOUS | Status: DC | PRN

## 2015-10-28 MED ORDER — SODIUM CHLORIDE 0.9 % IV SOLN
INTRAVENOUS | Status: DC
  Administered 2015-10-29: 05:00:00 via INTRAVENOUS

## 2015-10-28 MED ORDER — SODIUM CHLORIDE 0.9% FLUSH
3.0000 mL | Freq: Two times a day (BID) | INTRAVENOUS | Status: DC
  Administered 2015-10-28: 3 mL via INTRAVENOUS

## 2015-10-28 NOTE — Progress Notes (Signed)
ANTICOAGULATION CONSULT NOTE - Follow Up Consult  Pharmacy Consult for Heparin Indication: chest pain/ACS  No Known Allergies  Patient Measurements: Height: 5\' 11"  (180.3 cm) Weight: 190 lb 7.6 oz (86.4 kg) IBW/kg (Calculated) : 75.3  Vital Signs: Temp: 97.6 F (36.4 C) (09/04 2000) Temp Source: Oral (09/04 2000) BP: 85/59 (09/04 1615) Pulse Rate: 75 (09/04 1615)  Labs:  Recent Labs  10/25/15 2330  10/26/15 0250  10/27/15 0237  10/28/15 0233 10/28/15 1341 10/28/15 1947  HGB  --   < > 12.5*  --  11.6*  --  10.9*  --   --   HCT  --   --  39.8  --  36.5*  --  34.7*  --   --   PLT  --   --  300  --  279  --  266  --   --   HEPARINUNFRC 0.71*  --   --   < >  --   < > 0.25* 0.34 0.45  CREATININE  --   --  1.23  --  1.37*  --  1.20  --   --   TROPONINI <0.03  --  <0.03  --   --   --   --   --   --   < > = values in this interval not displayed.  Estimated Creatinine Clearance: 68 mL/min (by C-G formula based on SCr of 1.2 mg/dL).  Assessment: 60yo M with hx of CAD s/p multiple PCI here with chest pain. Pharmacy consulted to dose heparin during  work up. Plan to go to cath 9/5. Heparin level therapeutic x 2 today after infusion was increased to 1600 units/hr. No issue with infusion or sxs of bleeding.      Goal of Therapy:  Heparin level 0.3-0.7 units/ml Monitor platelets by anticoagulation protocol: Yes   Plan:  -Continue heparin at 1600 units/hr -Daily heparin level and CBC -Monitor for signs and symptoms of bleeding -Plans for cath on 9/5  Pollyann SamplesAndy Ples Trudel, PharmD, BCPS 10/28/2015, 8:21 PM Pager: [phone]

## 2015-10-28 NOTE — Progress Notes (Addendum)
Primary cardiologist: Dr. Olga MillersBrian Claire Dolores  Seen for followup: Chest pain, shortness of breath  Subjective:    No chest pain or dyspnea  Objective:   Temp:  [97.3 F (36.3 C)-97.9 F (36.6 C)] 97.9 F (36.6 C) (09/04 0300) Pulse Rate:  [76-91] 87 (09/04 0300) Resp:  [15-21] 21 (09/03 2353) BP: (88-96)/(51-66) 93/66 (09/04 0300) SpO2:  [98 %-100 %] 99 % (09/04 0300) Last BM Date: 10/26/15  Filed Weights   10/25/15 1445  Weight: 190 lb 7.6 oz (86.4 kg)    Intake/Output Summary (Last 24 hours) at 10/28/15 0740 Last data filed at 10/28/15 0700  Gross per 24 hour  Intake          1186.28 ml  Output             3100 ml  Net         -1913.72 ml    Telemetry: Sinus rhythm with PVCs and NSVT.  Exam:  General: No distress.  HEENT: normal  Neck: supple  Lungs: CTA  Cardiac: RRR  Abdomen: NT/ND, no masses  Extremities: No pitting edema.  Neuro: grossly intact  Lab Results:  Basic Metabolic Panel:  Recent Labs Lab 10/26/15 0250 10/27/15 0237 10/28/15 0233  NA 132* 132* 134*  K 4.2 4.0 3.8  CL 100* 98* 99*  CO2 24 27 28   GLUCOSE 98 97 105*  BUN 16 22* 22*  CREATININE 1.23 1.37* 1.20  CALCIUM 8.9 8.9 9.0    CBC:  Recent Labs Lab 10/26/15 0250 10/27/15 0237 10/28/15 0233  WBC 8.7 7.1 6.6  HGB 12.5* 11.6* 10.9*  HCT 39.8 36.5* 34.7*  MCV 96.1 95.3 95.6  PLT 300 279 266    Cardiac Enzymes:  Recent Labs Lab 10/25/15 1655 10/25/15 2330 10/26/15 0250  TROPONINI <0.03 <0.03 <0.03   Echocardiogram 12/09/2014: Study Conclusions  - Left ventricle: Systolic function is severely reduced, estimated   EF 15%. The cavity size was moderately dilated. Features are   consistent with a pseudonormal left ventricular filling pattern,   with concomitant abnormal relaxation and increased filling   pressure (grade 2 diastolic dysfunction). Doppler parameters are   consistent with high ventricular filling pressure. - Regional wall motion  abnormality: Akinesis of the mid-apical   anterior, apical inferior, mid anterolateral, apical lateral, and   apical myocardium; severe hypokinesis of the mid anteroseptal,   mid inferior, and apical septal myocardium; moderate hypokinesis   of the basal anteroseptal, basal inferior, and basal-mid   inferolateral myocardium. - Aortic valve: Mildly calcified annulus. Trileaflet. There was   mild to moderate regurgitation. - Mitral valve: There was moderate regurgitation. - Left atrium: The atrium was severely dilated. Volume/bsa, ES,   (1-plane Simpson&'s, A2C): 48.1 ml/m^2. - Right ventricle: Pacer wire or catheter noted in right ventricle.   Systolic function was mildly reduced. - Right atrium: The atrium was mildly dilated.   Medications:   Scheduled Medications: . aspirin EC  81 mg Oral Daily  . atorvastatin  40 mg Oral q1800  . buPROPion  150 mg Oral Daily  . carvedilol  6.25 mg Oral BID WC  . clopidogrel  75 mg Oral Daily  . furosemide  40 mg Oral Daily  . lisinopril  2.5 mg Oral Daily    Infusions: . heparin 1,450 Units/hr (10/28/15 0352)    PRN Medications: acetaminophen, ALPRAZolam, alum & mag hydroxide-simeth, HYDROcodone-acetaminophen, ondansetron (ZOFRAN) IV   Assessment:   1. Chest pain with typical and atypical features, He has  ruled out for ACS by troponin I levels and has been placed on heparin with plan for cardiac catheterization early this week per Dr. Antoine Poche.   2. 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  3. Ischemic cardiomyopathy with LVEF approximately 15% by echocardiogram in October 2016.  4. NSVT by monitor. Patient has Medtronic ICD in place.   5. Essential hypertension  6. Hyperlipidemia, on statin therapy.   Plan/Discussion:    Continue current regimen. Anticipate cardiac catheterization Tuesday morning (risks and benefits  including CVA, MI and death discussed and pt agrees to proceed). Hold lasix in AM prior to cath.   Olga Millers, M.D., F.A.C.C.

## 2015-10-28 NOTE — Progress Notes (Signed)
ANTICOAGULATION CONSULT NOTE - Follow Up Consult  Pharmacy Consult for Heparin Indication: chest pain/ACS  No Known Allergies  Patient Measurements: Height: 5\' 11"  (180.3 cm) Weight: 190 lb 7.6 oz (86.4 kg) IBW/kg (Calculated) : 75.3  Vital Signs: Temp: 97.1 F (36.2 C) (09/04 1143) Temp Source: Oral (09/04 1143) BP: 91/67 (09/04 1143) Pulse Rate: 85 (09/04 1143)  Labs:  Recent Labs  10/25/15 1655 10/25/15 2330  10/26/15 0250  10/27/15 0237  10/27/15 1813 10/28/15 0233 10/28/15 1341  HGB  --   --   < > 12.5*  --  11.6*  --   --  10.9*  --   HCT  --   --   --  39.8  --  36.5*  --   --  34.7*  --   PLT  --   --   --  300  --  279  --   --  266  --   HEPARINUNFRC  --  0.71*  --   --   < >  --   < > 0.29* 0.25* 0.34  CREATININE  --   --   --  1.23  --  1.37*  --   --  1.20  --   TROPONINI <0.03 <0.03  --  <0.03  --   --   --   --   --   --   < > = values in this interval not displayed.  Estimated Creatinine Clearance: 68 mL/min (by C-G formula based on SCr of 1.2 mg/dL).  Assessment: 60yo M with hx of CAD s/p multiple PCI here with w/o of chest pain. Pharmacy consulted to dose heparin during work up. Plan to go to cath 9/5. Heparin level was sub-therapeutic this am and rate was adjusted from 1450 units/hr to 1600 units/hr. Heparin level now therapeutic on 1600 units/hr.  Goal of Therapy:  Heparin level 0.3-0.7 units/ml Monitor platelets by anticoagulation protocol: Yes   Plan:  -Continue heparin 1600 units/hr -6 hour heparin level to confirm -Daily heparin level and CBC -Monitor for signs and symptoms of bleeding  Sherron MondayAubrey N. Manessa Buley, PharmD Clinical Pharmacy Resident Pager: [phone] 10/28/15 2:24 PM

## 2015-10-29 ENCOUNTER — Inpatient Hospital Stay (HOSPITAL_COMMUNITY)
Admission: AD | Disposition: A | Discharge status: Home / Self Care | Payer: Self-pay | Source: Other Acute Inpatient Hospital | Attending: Cardiology

## 2015-10-29 HISTORY — PX: CARDIAC CATHETERIZATION: SHX172

## 2015-10-29 LAB — CUP PACEART REMOTE DEVICE CHECK
HIGH POWER IMPEDANCE MEASURED VALUE: 47 Ohm
HighPow Impedance: 37 Ohm
Lead Channel Impedance Value: 380 Ohm
Lead Channel Sensing Intrinsic Amplitude: 1.875 mV
Lead Channel Setting Pacing Amplitude: 3 V
MDC IDC LEAD IMPLANT DT: 20040218
MDC IDC LEAD LOCATION: 753860
MDC IDC MSMT BATTERY VOLTAGE: 2.67 V
MDC IDC MSMT LEADCHNL RV SENSING INTR AMPL: 1.875 mV
MDC IDC SESS DTM: 20170831073527
MDC IDC SET LEADCHNL RV PACING PULSEWIDTH: 0.4 ms
MDC IDC SET LEADCHNL RV SENSING SENSITIVITY: 0.3 mV
MDC IDC STAT BRADY RV PERCENT PACED: 0 %

## 2015-10-29 LAB — BASIC METABOLIC PANEL
ANION GAP: 7 (ref 5–15)
BUN: 20 mg/dL (ref 6–20)
CALCIUM: 9.1 mg/dL (ref 8.9–10.3)
CO2: 27 mmol/L (ref 22–32)
CREATININE: 1.06 mg/dL (ref 0.61–1.24)
Chloride: 101 mmol/L (ref 101–111)
GLUCOSE: 99 mg/dL (ref 65–99)
Potassium: 4.4 mmol/L (ref 3.5–5.1)
Sodium: 135 mmol/L (ref 135–145)

## 2015-10-29 LAB — POCT I-STAT 3, VENOUS BLOOD GAS (G3P V)
Acid-Base Excess: 1 mmol/L (ref 0.0–2.0)
Bicarbonate: 26.2 mmol/L (ref 20.0–28.0)
O2 SAT: 60 %
TCO2: 28 mmol/L (ref 0–100)
pCO2, Ven: 43.5 mmHg — ABNORMAL LOW (ref 44.0–60.0)
pH, Ven: 7.389 (ref 7.250–7.430)
pO2, Ven: 32 mmHg (ref 32.0–45.0)

## 2015-10-29 LAB — CBC
HCT: 35.5 % — ABNORMAL LOW (ref 39.0–52.0)
Hemoglobin: 11.1 g/dL — ABNORMAL LOW (ref 13.0–17.0)
MCH: 29.8 pg (ref 26.0–34.0)
MCHC: 31.3 g/dL (ref 30.0–36.0)
MCV: 95.2 fL (ref 78.0–100.0)
PLATELETS: 266 10*3/uL (ref 150–400)
RBC: 3.73 MIL/uL — ABNORMAL LOW (ref 4.22–5.81)
RDW: 13.9 % (ref 11.5–15.5)
WBC: 5.3 10*3/uL (ref 4.0–10.5)

## 2015-10-29 LAB — HEPARIN LEVEL (UNFRACTIONATED): HEPARIN UNFRACTIONATED: 0.39 [IU]/mL (ref 0.30–0.70)

## 2015-10-29 LAB — POCT I-STAT 3, ART BLOOD GAS (G3+)
ACID-BASE DEFICIT: 1 mmol/L (ref 0.0–2.0)
BICARBONATE: 24.2 mmol/L (ref 20.0–28.0)
O2 Saturation: 99 %
TCO2: 25 mmol/L (ref 0–100)
pCO2 arterial: 38.9 mmHg (ref 32.0–48.0)
pH, Arterial: 7.401 (ref 7.350–7.450)
pO2, Arterial: 149 mmHg — ABNORMAL HIGH (ref 83.0–108.0)

## 2015-10-29 LAB — PROTIME-INR
INR: 1.16
PROTHROMBIN TIME: 14.8 s (ref 11.4–15.2)

## 2015-10-29 SURGERY — RIGHT/LEFT HEART CATH AND CORONARY ANGIOGRAPHY
Anesthesia: LOCAL

## 2015-10-29 MED ORDER — MIDAZOLAM HCL 2 MG/2ML IJ SOLN
INTRAMUSCULAR | Status: AC
  Filled 2015-10-29: qty 2

## 2015-10-29 MED ORDER — MIDAZOLAM HCL 2 MG/2ML IJ SOLN
INTRAMUSCULAR | Status: DC | PRN
  Administered 2015-10-29: 1 mg via INTRAVENOUS
  Administered 2015-10-29: 2 mg via INTRAVENOUS

## 2015-10-29 MED ORDER — FENTANYL CITRATE (PF) 100 MCG/2ML IJ SOLN
INTRAMUSCULAR | Status: DC | PRN
  Administered 2015-10-29 (×2): 25 ug via INTRAVENOUS

## 2015-10-29 MED ORDER — HEPARIN (PORCINE) IN NACL 2-0.9 UNIT/ML-% IJ SOLN
INTRAMUSCULAR | Status: AC
  Filled 2015-10-29: qty 1000

## 2015-10-29 MED ORDER — HEPARIN SODIUM (PORCINE) 1000 UNIT/ML IJ SOLN
INTRAMUSCULAR | Status: DC | PRN
  Administered 2015-10-29: 4500 [IU] via INTRAVENOUS

## 2015-10-29 MED ORDER — SODIUM CHLORIDE 0.9 % IV SOLN: 250.0000 mL | INTRAVENOUS | Status: DC | PRN

## 2015-10-29 MED ORDER — ASPIRIN 81 MG PO CHEW: 81.0000 mg | CHEWABLE_TABLET | ORAL | Status: DC

## 2015-10-29 MED ORDER — ENOXAPARIN SODIUM 40 MG/0.4ML ~~LOC~~ SOLN
40.0000 mg | SUBCUTANEOUS | Status: DC
Start: 2015-10-30 — End: 2015-10-30
  Administered 2015-10-30: 40 mg via SUBCUTANEOUS
  Filled 2015-10-29: qty 0.4

## 2015-10-29 MED ORDER — VERAPAMIL HCL 2.5 MG/ML IV SOLN
INTRAVENOUS | Status: AC
  Filled 2015-10-29: qty 2

## 2015-10-29 MED ORDER — SODIUM CHLORIDE 0.9 % IV SOLN: INTRAVENOUS | Status: DC

## 2015-10-29 MED ORDER — FENTANYL CITRATE (PF) 100 MCG/2ML IJ SOLN
INTRAMUSCULAR | Status: AC
  Filled 2015-10-29: qty 2

## 2015-10-29 MED ORDER — SODIUM CHLORIDE 0.9% FLUSH: 3.0000 mL | INTRAVENOUS | Status: DC | PRN

## 2015-10-29 MED ORDER — SODIUM CHLORIDE 0.9% FLUSH
3.0000 mL | Freq: Two times a day (BID) | INTRAVENOUS | Status: DC
  Administered 2015-10-29 – 2015-10-30 (×2): 3 mL via INTRAVENOUS

## 2015-10-29 MED ORDER — HEPARIN (PORCINE) IN NACL 2-0.9 UNIT/ML-% IJ SOLN
INTRAMUSCULAR | Status: DC | PRN
  Administered 2015-10-29: 1000 mL

## 2015-10-29 MED ORDER — LIDOCAINE HCL (PF) 1 % IJ SOLN
INTRAMUSCULAR | Status: DC | PRN
  Administered 2015-10-29 (×2): 2 mL

## 2015-10-29 MED ORDER — HEPARIN SODIUM (PORCINE) 1000 UNIT/ML IJ SOLN
INTRAMUSCULAR | Status: AC
  Filled 2015-10-29: qty 1

## 2015-10-29 MED ORDER — LIDOCAINE HCL (PF) 1 % IJ SOLN
INTRAMUSCULAR | Status: AC
  Filled 2015-10-29: qty 30

## 2015-10-29 MED ORDER — VERAPAMIL HCL 2.5 MG/ML IV SOLN
INTRAVENOUS | Status: DC | PRN
  Administered 2015-10-29: 10 mL via INTRA_ARTERIAL

## 2015-10-29 MED ORDER — SODIUM CHLORIDE 0.9% FLUSH: 3.0000 mL | Freq: Two times a day (BID) | INTRAVENOUS | Status: DC

## 2015-10-29 MED ORDER — IOPAMIDOL (ISOVUE-370) INJECTION 76%
INTRAVENOUS | Status: DC | PRN
  Administered 2015-10-29: 100 mL via INTRAVENOUS

## 2015-10-29 SURGICAL SUPPLY — 17 items
CATH BALLN WEDGE 5F 110CM (CATHETERS) ×1 IMPLANT
CATH INFINITI 5 FR 3DRC (CATHETERS) ×1 IMPLANT
CATH INFINITI 5 FR JL3.5 (CATHETERS) ×1 IMPLANT
CATH INFINITI 5 FR MPA2 (CATHETERS) ×1 IMPLANT
CATH INFINITI 5FR ANG PIGTAIL (CATHETERS) ×1 IMPLANT
CATH INFINITI JR4 5F (CATHETERS) ×1 IMPLANT
DEVICE RAD COMP TR BAND LRG (VASCULAR PRODUCTS) ×1 IMPLANT
GLIDESHEATH SLEND SS 6F .021 (SHEATH) ×1 IMPLANT
KIT HEART LEFT (KITS) ×2 IMPLANT
KIT HEART RIGHT NAMIC (KITS) ×2 IMPLANT
PACK CARDIAC CATHETERIZATION (CUSTOM PROCEDURE TRAY) ×2 IMPLANT
SHEATH FAST CATH BRACH 5F 5CM (SHEATH) ×1 IMPLANT
TRANSDUCER W/STOPCOCK (MISCELLANEOUS) ×3 IMPLANT
TUBING CIL FLEX 10 FLL-RA (TUBING) ×2 IMPLANT
WIRE EMERALD 3MM-J .025X260CM (WIRE) ×1 IMPLANT
WIRE HI TORQ VERSACORE-J 145CM (WIRE) ×1 IMPLANT
WIRE SAFE-T 1.5MM-J .035X260CM (WIRE) ×1 IMPLANT

## 2015-10-29 NOTE — Progress Notes (Signed)
Spoke with Clydie BraunKaren in service response regarding pt's dinner tray who claimed that it be up at 6:30 pm.

## 2015-10-29 NOTE — Care Management Note (Signed)
Case Management Note  Patient Details  Name: [name] MRN: [id number] Date of Birth: 05/10/1955  Subjective/Objective:              Adm w angin, for cath today       Action/Plan: lives w wife   Expected Discharge Date:                  Expected Discharge Plan:  Home w Home Health Services  In-House Referral:     Discharge planning Services  CM Consult  Post Acute Care Choice:    Choice offered to:     DME Arranged:    DME Agency:     HH Arranged:    HH Agency:     Status of Service:  In process, will continue to follow  If discussed at Long Length of Stay Meetings, dates discussed:    Additional Comments: will follow for dc planning needs. Ef 15% may benefit from hhc.  Hanley Haysowell, Sidnie Swalley T, RN 10/29/2015, 11:32 AM

## 2015-10-29 NOTE — Progress Notes (Signed)
TR Band removed gauze 2x2 and tegaderm applied, instructed to call for any signs of bleeding, right arm elevated with pillow.

## 2015-10-29 NOTE — Progress Notes (Signed)
ANTICOAGULATION CONSULT NOTE - Follow Up Consult  Pharmacy Consult for Heparin Indication: chest pain/ACS  No Known Allergies  Patient Measurements: Height: 5\' 11"  (180.3 cm) Weight: 195 lb 8 oz (88.7 kg) IBW/kg (Calculated) : 75.3  Vital Signs: Temp: 97.9 F (36.6 C) (09/05 0715) Temp Source: Oral (09/05 0715) BP: 96/61 (09/05 0715) Pulse Rate: 86 (09/05 0715)  Labs:  Recent Labs  10/27/15 0237  10/28/15 0233 10/28/15 1341 10/28/15 1947 10/29/15 0231  HGB 11.6*  --  10.9*  --   --  11.1*  HCT 36.5*  --  34.7*  --   --  35.5*  PLT 279  --  266  --   --  266  HEPARINUNFRC  --   < > 0.25* 0.34 0.45 0.39  CREATININE 1.37*  --  1.20  --   --  1.06  < > = values in this interval not displayed.  Estimated Creatinine Clearance: 77 mL/min (by C-G formula based on SCr of 1.06 mg/dL).  Assessment: 60yo M with hx of CAD s/p multiple PCI here with chest pain. Pharmacy consulted to dose heparin during work up. Plan to go to cath 9/5. Heparin level therapeutic today after infusion was increased to 1600 units/hr. No issue with infusion or sxs of bleeding.      Goal of Therapy:  Heparin level 0.3-0.7 units/ml Monitor platelets by anticoagulation protocol: Yes   Plan:  -Continue heparin at 1600 units/hr -Daily heparin level and CBC -Monitor for signs and symptoms of bleeding -Plans for cath today  Sherron MondayAubrey N. Fartun Paradiso, PharmD Clinical Pharmacy Resident Pager: [phone] 10/29/15 10:09 AM

## 2015-10-29 NOTE — Progress Notes (Signed)
Back from the cath lab. With TRBand to right  Wrist , elevated with pillow, pulse ox to right thumb. Instructed to call for signs of bleeding. Continue to monitor.

## 2015-10-29 NOTE — Interval H&P Note (Signed)
History and Physical Interval Note:  10/29/2015 3:19 PM  [name]  has presented today for surgery, with the diagnosis of cp  The various methods of treatment have been discussed with the patient and family. After consideration of risks, benefits and other options for treatment, the patient has consented to  Procedure(s): Right/Left Heart Cath and Coronary Angiography (N/A) as a surgical intervention .  The patient's history has been reviewed, patient examined, no change in status, stable for surgery.  I have reviewed the patient's chart and labs.  Questions were answered to the patient's satisfaction.   Cath Lab Visit (complete for each Cath Lab visit)  Clinical Evaluation Leading to the Procedure:   ACS: Yes.    Non-ACS:    Anginal Classification: CCS IV  Anti-ischemic medical therapy: Maximal Therapy (2 or more classes of medications)  Non-Invasive Test Results: No non-invasive testing performed  Prior CABG: No previous CABG        Theron Aristaeter Hartford HospitalJordanMD,FACC 10/29/2015 3:19 PM

## 2015-10-29 NOTE — H&P (View-Only) (Signed)
Patient ID: [name], male   DOB: 05/11/1955, 60 y.o.   MRN: [id number]    Primary cardiologist: Dr. Brian Crenshaw  Seen for followup: Chest pain, shortness of breath  Subjective:    No chest pain or dyspnea not on cath board and no orders done   Objective:   Temp:  [97.1 F (36.2 C)-98.2 F (36.8 C)] 97.9 F (36.6 C) (09/05 0715) Pulse Rate:  [44-89] 86 (09/05 0715) Resp:  [16-20] 18 (09/05 0715) BP: (85-99)/(59-67) 96/61 (09/05 0715) SpO2:  [96 %-100 %] 97 % (09/05 0715) Weight:  [195 lb 8 oz (88.7 kg)] 195 lb 8 oz (88.7 kg) (09/04 2124) Last BM Date: 10/26/15  Filed Weights   10/25/15 1445 10/28/15 2124  Weight: 190 lb 7.6 oz (86.4 kg) 195 lb 8 oz (88.7 kg)    Intake/Output Summary (Last 24 hours) at 10/29/15 0801 Last data filed at 10/29/15 0700  Gross per 24 hour  Intake          1443.67 ml  Output             2725 ml  Net         -1281.33 ml    Telemetry: Sinus rhythm with PVCs and NSVT. 10/29/2015   Exam:  General: No distress.  HEENT: normal  Neck: supple  Lungs: CTA  Cardiac: RRR AICD under left clavicle PMI increased   Abdomen: NT/ND, no masses  Extremities: No pitting edema.  Neuro: grossly intact  Lab Results:  Basic Metabolic Panel:  Recent Labs Lab 10/27/15 0237 10/28/15 0233 10/29/15 0231  NA 132* 134* 135  K 4.0 3.8 4.4  CL 98* 99* 101  CO2 27 28 27  GLUCOSE 97 105* 99  BUN 22* 22* 20  CREATININE 1.37* 1.20 1.06  CALCIUM 8.9 9.0 9.1    CBC:  Recent Labs Lab 10/27/15 0237 10/28/15 0233 10/29/15 0231  WBC 7.1 6.6 5.3  HGB 11.6* 10.9* 11.1*  HCT 36.5* 34.7* 35.5*  MCV 95.3 95.6 95.2  PLT 279 266 266    Cardiac Enzymes:  Recent Labs Lab 10/25/15 1655 10/25/15 2330 10/26/15 0250  TROPONINI <0.03 <0.03 <0.03   Echocardiogram 12/09/2014: Study Conclusions  - Left ventricle: Systolic function is severely reduced, estimated   EF 15%. The cavity size was moderately dilated. Features are   consistent with  a pseudonormal left ventricular filling pattern,   with concomitant abnormal relaxation and increased filling   pressure (grade 2 diastolic dysfunction). Doppler parameters are   consistent with high ventricular filling pressure. - Regional wall motion abnormality: Akinesis of the mid-apical   anterior, apical inferior, mid anterolateral, apical lateral, and   apical myocardium; severe hypokinesis of the mid anteroseptal,   mid inferior, and apical septal myocardium; moderate hypokinesis   of the basal anteroseptal, basal inferior, and basal-mid   inferolateral myocardium. - Aortic valve: Mildly calcified annulus. Trileaflet. There was   mild to moderate regurgitation. - Mitral valve: There was moderate regurgitation. - Left atrium: The atrium was severely dilated. Volume/bsa, ES,   (1-plane Simpson&'s, A2C): 48.1 ml/m^2. - Right ventricle: Pacer wire or catheter noted in right ventricle.   Systolic function was mildly reduced. - Right atrium: The atrium was mildly dilated.   Medications:   Scheduled Medications: . aspirin EC  81 mg Oral Daily  . atorvastatin  40 mg Oral q1800  . buPROPion  150 mg Oral Daily  . carvedilol  6.25 mg Oral BID WC  . clopidogrel  75   mg Oral Daily  . furosemide  40 mg Oral Daily  . lisinopril  2.5 mg Oral Daily    Infusions: . heparin 1,600 Units/hr (10/29/15 0700)    PRN Medications: acetaminophen, ALPRAZolam, alum & mag hydroxide-simeth, HYDROcodone-acetaminophen, ondansetron (ZOFRAN) IV   Assessment:   1. Chest pain with typical and atypical features, He has ruled out for ACS by troponin I levels and has been placed on heparin with plan for cardiac catheterization  Called lab placed on schedule with PJ no LV gram left and right   2. 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  3. Ischemic cardiomyopathy with LVEF approximately 15% by  echocardiogram in October 2016.  4. NSVT by monitor. Patient has Medtronic ICD in place.   5. Essential hypertension  6. Hyperlipidemia, on statin therapy.   Plan/Discussion:    Continue current regimen. Anticipate cardiac catheterization today risks and benefits including CVA, MI and death discussed and pt agrees to proceed). Hold lasix in AM prior to cath. CHF stable and euvolemic AICD normal function no d/c    Evora Schechter   

## 2015-10-29 NOTE — Progress Notes (Signed)
Patient ID: [name], male   DOB: 11/07/1955, 60 y.o.   MRN: [id number]    Primary cardiologist: Dr. Olga MillersBrian [name]  Seen for followup: Chest pain, shortness of breath  Subjective:    No chest pain or dyspnea not on cath board and no orders done   Objective:   Temp:  [97.1 F (36.2 C)-98.2 F (36.8 C)] 97.9 F (36.6 C) (09/05 0715) Pulse Rate:  [44-89] 86 (09/05 0715) Resp:  [16-20] 18 (09/05 0715) BP: (85-99)/(59-67) 96/61 (09/05 0715) SpO2:  [96 %-100 %] 97 % (09/05 0715) Weight:  [195 lb 8 oz (88.7 kg)] 195 lb 8 oz (88.7 kg) (09/04 2124) Last BM Date: 10/26/15  Filed Weights   10/25/15 1445 10/28/15 2124  Weight: 190 lb 7.6 oz (86.4 kg) 195 lb 8 oz (88.7 kg)    Intake/Output Summary (Last 24 hours) at 10/29/15 0801 Last data filed at 10/29/15 0700  Gross per 24 hour  Intake          1443.67 ml  Output             2725 ml  Net         -1281.33 ml    Telemetry: Sinus rhythm with PVCs and NSVT. 10/29/2015   Exam:  General: No distress.  HEENT: normal  Neck: supple  Lungs: CTA  Cardiac: RRR AICD under left clavicle PMI increased   Abdomen: NT/ND, no masses  Extremities: No pitting edema.  Neuro: grossly intact  Lab Results:  Basic Metabolic Panel:  Recent Labs Lab 10/27/15 0237 10/28/15 0233 10/29/15 0231  NA 132* 134* 135  K 4.0 3.8 4.4  CL 98* 99* 101  CO2 27 28 27   GLUCOSE 97 105* 99  BUN 22* 22* 20  CREATININE 1.37* 1.20 1.06  CALCIUM 8.9 9.0 9.1    CBC:  Recent Labs Lab 10/27/15 0237 10/28/15 0233 10/29/15 0231  WBC 7.1 6.6 5.3  HGB 11.6* 10.9* 11.1*  HCT 36.5* 34.7* 35.5*  MCV 95.3 95.6 95.2  PLT 279 266 266    Cardiac Enzymes:  Recent Labs Lab 10/25/15 1655 10/25/15 2330 10/26/15 0250  TROPONINI <0.03 <0.03 <0.03   Echocardiogram 12/09/2014: Study Conclusions  - Left ventricle: Systolic function is severely reduced, estimated   EF 15%. The cavity size was moderately dilated. Features are   consistent with  a pseudonormal left ventricular filling pattern,   with concomitant abnormal relaxation and increased filling   pressure (grade 2 diastolic dysfunction). Doppler parameters are   consistent with high ventricular filling pressure. - Regional wall motion abnormality: Akinesis of the mid-apical   anterior, apical inferior, mid anterolateral, apical lateral, and   apical myocardium; severe hypokinesis of the mid anteroseptal,   mid inferior, and apical septal myocardium; moderate hypokinesis   of the basal anteroseptal, basal inferior, and basal-mid   inferolateral myocardium. - Aortic valve: Mildly calcified annulus. Trileaflet. There was   mild to moderate regurgitation. - Mitral valve: There was moderate regurgitation. - Left atrium: The atrium was severely dilated. Volume/bsa, ES,   (1-plane Simpson&'s, A2C): 48.1 ml/m^2. - Right ventricle: Pacer wire or catheter noted in right ventricle.   Systolic function was mildly reduced. - Right atrium: The atrium was mildly dilated.   Medications:   Scheduled Medications: . aspirin EC  81 mg Oral Daily  . atorvastatin  40 mg Oral q1800  . buPROPion  150 mg Oral Daily  . carvedilol  6.25 mg Oral BID WC  . clopidogrel  75  mg Oral Daily  . furosemide  40 mg Oral Daily  . lisinopril  2.5 mg Oral Daily    Infusions: . heparin 1,600 Units/hr (10/29/15 0700)    PRN Medications: acetaminophen, ALPRAZolam, alum & mag hydroxide-simeth, HYDROcodone-acetaminophen, ondansetron (ZOFRAN) IV   Assessment:   1. Chest pain with typical and atypical features, He has ruled out for ACS by troponin I levels and has been placed on heparin with plan for cardiac catheterization  Called lab placed on schedule with PJ no LV gram left and right   2. 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  3. Ischemic cardiomyopathy with LVEF approximately 15% by  echocardiogram in October 2016.  4. NSVT by monitor. Patient has Medtronic ICD in place.   5. Essential hypertension  6. Hyperlipidemia, on statin therapy.   Plan/Discussion:    Continue current regimen. Anticipate cardiac catheterization today risks and benefits including CVA, MI and death discussed and pt agrees to proceed). Hold lasix in AM prior to cath. CHF stable and euvolemic AICD normal function no d/c    [name]

## 2015-10-29 NOTE — Progress Notes (Signed)
Tech offered Pt a bath. Pt stated he has already had a bath.

## 2015-10-30 ENCOUNTER — Encounter (HOSPITAL_COMMUNITY): Payer: Self-pay | Admitting: Cardiology

## 2015-10-30 ENCOUNTER — Telehealth: Payer: Self-pay | Admitting: Cardiology

## 2015-10-30 MED ORDER — CARVEDILOL 3.125 MG PO TABS: 6.2500 mg | ORAL_TABLET | Freq: Two times a day (BID) | ORAL | 11 refills | Status: DC

## 2015-10-30 NOTE — Telephone Encounter (Signed)
New message    Has a questions about of carvedilol that was called in

## 2015-10-30 NOTE — Progress Notes (Signed)
Cards fellow called and notified about patient's low blood pressures. Patient asymptomatic. No new orders given at this time. Will continue to monitor patient.

## 2015-10-30 NOTE — Progress Notes (Signed)
Offered Pt a bath, Pt stated he will be going home and would like to get a bath once he arrives home.

## 2015-10-30 NOTE — Discharge Summary (Signed)
Discharge Summary    Patient ID: [name],  MRN: [id number], DOB/AGE: Oct 31, 1955 60 y.o.  Admit date: 10/25/2015 Discharge date: 10/30/2015  Primary Care Provider: Pcp Not In System Primary Cardiologist: Dr. Jens Som  Discharge Diagnoses    Active Problems:   Unstable angina [facility])   Allergies No Known Allergies  Diagnostic Studies/Procedures    R/LHC: 10/29/2015  Conclusion     Mid RCA lesion, 30 %stenosed.  Ost LAD to Mid LAD lesion, 10 %stenosed.  Ost 2nd Diag lesion, 85 %stenosed.  Ost 1st Mrg to 1st Mrg lesion, 99 %stenosed.  Ost Cx to Mid Cx lesion, 10 %stenosed.  Ost 2nd Mrg to 2nd Mrg lesion, 0 %stenosed.  Hemodynamic findings consistent with mild pulmonary hypertension.   1. 2 vessel obstructive CAD.      - 85% ostial second diagonal ( covered by LAD stent). This is a chronic finding     - 99% CTO of a small OM 1 2. Patent stents in the proximal to mid LAD, proximal to mid LCx, second OM, and mid RCA 3. Moderate pulmonary HTN 4. Elevated LV filling pressures with preserved cardiac output.   Plan: No new stenosis to explain his current symptoms. Recommend continued medical therapy.    _____________   History of Present Illness     [name] is a 60 yo male patient of Dr. Jens Som and Dr. Graciela Husbands with PMH of CAD s/p multiple PCIs, ICM (EF 15%)/chronic systolic CHF, HTN, HLD, TIA, tobacco abuse and h/o VT s/p Medtronic ICD. His last cardiac cath in 7/14 showednormal left main, patent stents in the LAD with an 80-90% second diagonal (jailed), 90% proximal circumflex, occluded first obtuse marginal, 80-90% second obtuse marginal and ejection fraction 25%. Patient had PCI of his circumflex and second obtuse marginal at that time. He was later admitted on 10/16 with aphasia and confusion, felt likely to be a TIA. Neurology at that time recommended anticoagulation with coumadin, but this was discussed with cardiology who felt he would not a good candidate  given his poor compliance. Device did not show any sign of atrial fibrillation.   Last 2D echo 10/16 showed EF 15%, grade 2 DD, high ventricular filling pressures, multiple WMA, mild-mod AI, mod MR, mildly dilated RA, severely dilated LA.   He was admitted 4/26-4/28/17 for acute on chronic CHF and diuresed inpatient with his discharge weight was 200lbs.He followed up in the office with Theodore Demark PA, no medication changes were made at that time. He was last seen in the office on 5/24 by Carlean Jews and reported sporadic SOB but with improvement. His lasix was recently increased prior to his office visit when he called reporting increased SOB, to 80mg  daily.   He was recently seen in the [facility] ED on 8/25 with reports of SSCP that had been continuous for the past 24 hours prior to presenting to the ED. EKG at that time showed known SR with RBBB with 1st degree AVB, and LAFB with no acute ST/T wave changes. His enzymes at that time were negative. CTA showed no PE. His pain at that time was not felt to be cardiac in nature. He was discharged home with a Rx for robaxin, OTC NSAIDS and 10 tablets of Xanax.   Reports he had been feeling well since discharged from the ER previously. States that he actually returned to work on Wednesday and Thursday of that week and felt well. States he was getting up to potentially  go to work that morning and developed sudden onset of SSCP, shortness of breath and diaphoresis. States this feels very similar to his episode that he was seen in the ER with previously. Reports that he is concerned that he is having "panic attacks", but is unsure of why he is having them. [facility] his [facility]. His troponin was negative while at [facility]. His EKG showed known right bundle-branch block with first-degree AV block, and left anterior vesicular block. States he was given 3 SL nitro and 2 doses of morphine which did bring his pain from 8/10-3/10. Chest  x-ray there was negative for edema. Labs showed normal electrolytes, pro-BNP 4730, and stable hemoglobin. His case was discussed with the cardiology group and decision was made to transfer to call for further workup.  Hospital Course     Consultants: None  He was started on IV heparin with a plan for possible cardiac catheterization the following the weekend. On admission his coreg was increased to 6.25mg , with ASA, Plavix and lisinopril continued. He was not volume overloaded on exam, and renal function was stable to continue his home dose of 40mg  Lasix.   He was observed over the weekend with no further episodes of chest pain during admission. He ruled out with troponin levels. Blood pressure remained stable with the increase of his coreg.   Underwent a R/LHC with Dr. [place] on 9/5 showing known 2 vessel obstructive CAD with patent stents to prox-mid LAD, prox-mid LCx, second OM and mid RCA with moderate pulmonary HTN and elevated LV filling pressured with preserved cardiac output. No new stenosis was noted and recommended continuation of medical therapy. He was observed overnight without any complications.   On 9/6 he was seen and assessed by Dr. Eden EmmsNishan and determined stable for discharge home. I have arranged for a 2 week follow up in the office. _____________  Discharge Vitals Blood pressure 110/76, pulse 86, temperature 97.6 F (36.4 C), temperature source Oral, resp. rate 18, height 5\' 11"  (1.803 m), weight 195 lb 8 oz (88.7 kg), SpO2 100 %.  Filed Weights   10/25/15 1445 10/28/15 2124 10/30/15 0400  Weight: 190 lb 7.6 oz (86.4 kg) 195 lb 8 oz (88.7 kg) 195 lb 8 oz (88.7 kg)    Labs & Radiologic Studies    CBC  Recent Labs  10/28/15 0233 10/29/15 0231  WBC 6.6 5.3  HGB 10.9* 11.1*  HCT 34.7* 35.5*  MCV 95.6 95.2  PLT 266 266   Basic Metabolic Panel  Recent Labs  10/28/15 0233 10/29/15 0231  NA 134* 135  K 3.8 4.4  CL 99* 101  CO2 28 27  GLUCOSE 105* 99  BUN 22*  20  CREATININE 1.20 1.06  CALCIUM 9.0 9.1   Liver Function Tests No results for input(s): AST, ALT, ALKPHOS, BILITOT, PROT, ALBUMIN in the last 72 hours. No results for input(s): LIPASE, AMYLASE in the last 72 hours. Cardiac Enzymes No results for input(s): CKTOTAL, CKMB, CKMBINDEX, TROPONINI in the last 72 hours. BNP Invalid input(s): POCBNP D-Dimer No results for input(s): DDIMER in the last 72 hours. Hemoglobin A1C No results for input(s): HGBA1C in the last 72 hours. Fasting Lipid Panel No results for input(s): CHOL, HDL, LDLCALC, TRIG, CHOLHDL, LDLDIRECT in the last 72 hours. Thyroid Function Tests No results for input(s): TSH, T4TOTAL, T3FREE, THYROIDAB in the last 72 hours.  Invalid input(s): FREET3 _____________    Disposition   Pt is being discharged home today in good condition.  Follow-up Plans & Appointments    Follow-up Information    [facility]ale[facility] Course, [place] Follow up on 11/13/2015.   Specialties:  Cardiology, Radiology Why:  10:30am for your hospital follow up with Azalee Course.  Contact information: [address] [place] [address] [phone]          Discharge Instructions    Diet - low sodium heart healthy    Complete by:  As directed   Discharge instructions    Complete by:  As directed   Radial Site Care Refer to this sheet in the next few weeks. These instructions provide you with information on caring for yourself after your procedure. Your caregiver may also give you more specific instructions. Your treatment has been planned according to current medical practices, but problems sometimes occur. Call your caregiver if you have any problems or questions after your procedure. HOME CARE INSTRUCTIONS You may shower the day after the procedure.Remove the bandage (dressing) and gently wash the site with plain soap and water.Gently pat the site dry.  Do not apply powder or lotion to the site.  Do not submerge the affected site in water for 3 to  5 days.  Inspect the site at least twice daily.  Do not flex or bend the affected arm for 24 hours.  No lifting over 5 pounds (2.3 kg) for 5 days after your procedure.  Do not drive home if you are discharged the same day of the procedure. Have someone else drive you.  You may drive 24 hours after the procedure unless otherwise instructed by your caregiver.  What to expect: Any bruising will usually fade within 1 to 2 weeks.  Blood that collects in the tissue (hematoma) may be painful to the touch. It should usually decrease in size and tenderness within 1 to 2 weeks.  SEEK IMMEDIATE MEDICAL CARE IF: You have unusual pain at the radial site.  You have redness, warmth, swelling, or pain at the radial site.  You have drainage (other than a small amount of blood on the dressing).  You have chills.  You have a fever or persistent symptoms for more than 72 hours.  You have a fever and your symptoms suddenly get worse.  Your arm becomes pale, cool, tingly, or numb.  You have heavy bleeding from the site. Hold pressure on the site.    We have increased your coreg dose this admission from 3.125mg  twice a day to 6.25mg  twice a day. Please monitor for signs of low blood pressure at home, including dizziness and lightheadedness. Monitor your blood pressure 3 times a week if possible and make sure you stay well hydrated.   Increase activity slowly    Complete by:  As directed      Discharge Medications   Current Discharge Medication List    CONTINUE these medications which have CHANGED   Details  carvedilol (COREG) 3.125 MG tablet Take 2 tablets (6.25 mg total) by mouth 2 (two) times daily with a meal. Qty: 60 tablet, Refills: 11   Associated Diagnoses: SOB (shortness of breath)      CONTINUE these medications which have NOT CHANGED   Details  ALPRAZolam (XANAX) 0.25 MG tablet Take 1 tablet (0.25 mg total) by mouth 3 (three) times daily as needed for anxiety. Qty: 10 tablet, Refills: 0      aspirin EC 81 MG tablet Take 81 mg by mouth daily.    atorvastatin (LIPITOR) 40 MG tablet Take 1 tablet (40 mg total)  by mouth daily at 6 PM. Qty: 30 tablet, Refills: 11   Associated Diagnoses: SOB (shortness of breath)    clopidogrel (PLAVIX) 75 MG tablet Take 1 tablet (75 mg total) by mouth daily. Take one table by mouth everyday with breakfast. Qty: 30 tablet, Refills: 11   Associated Diagnoses: SOB (shortness of breath)    clotrimazole-betamethasone (LOTRISONE) cream Apply 1 application topically 2 (two) times daily. Qty: 15 g, Refills: 0    furosemide (LASIX) 40 MG tablet Take 40 mg by mouth daily. Pt states that sometimes he will take med twice daily if needed    lisinopril (PRINIVIL,ZESTRIL) 2.5 MG tablet Take 2.5 mg by mouth daily.    nitroGLYCERIN (NITROSTAT) 0.4 MG SL tablet Place 1 tablet (0.4 mg total) under the tongue every 5 (five) minutes as needed. For chest pain Qty: 25 tablet, Refills: 11   Associated Diagnoses: SOB (shortness of breath)      STOP taking these medications     buPROPion (WELLBUTRIN XL) 150 MG 24 hr tablet          Aspirin prescribed at discharge?  Yes High Intensity Statin Prescribed? (Lipitor 40-80mg  or Crestor 20-40mg ): Yes Beta Blocker Prescribed? Yes For EF <40%, was ACEI/ARB Prescribed? Yes ADP Receptor Inhibitor Prescribed? (i.e. Plavix etc.-Includes Medically Managed Patients): Yes For EF <40%, Aldosterone Inhibitor Prescribed? No:  Was EF assessed during THIS hospitalization? Yes Was Cardiac Rehab II ordered? (Included Medically managed Patients): No:    Outstanding Labs/Studies   None  Duration of Discharge Encounter   Greater than 30 minutes including physician time.  Signed, Laverda Page NP-C 10/30/2015, 10:23 AM

## 2015-10-30 NOTE — Progress Notes (Signed)
Patient ID: [name], male   DOB: [birth year], 60 y.o.   MRN: [id number]    Primary cardiologist: Dr. Olga Millers  Seen for followup: Chest pain, shortness of breath  Subjective:    No chest pain or dyspnea not on cath board and no orders done   Objective:   Temp:  [97.4 F (36.3 C)-97.9 F (36.6 C)] 97.9 F (36.6 C) (09/06 0400) Pulse Rate:  [0-93] 84 (09/06 0600) Resp:  [0-20] 18 (09/06 0400) BP: (77-128)/(41-84) 97/68 (09/06 0600) SpO2:  [0 %-100 %] 99 % (09/06 0600) Weight:  [88.7 kg (195 lb 8 oz)] 88.7 kg (195 lb 8 oz) (09/06 0400) Last BM Date: 10/26/15  Filed Weights   10/25/15 1445 10/28/15 2124 10/30/15 0400  Weight: 86.4 kg (190 lb 7.6 oz) 88.7 kg (195 lb 8 oz) 88.7 kg (195 lb 8 oz)    Intake/Output Summary (Last 24 hours) at 10/30/15 0752 Last data filed at 10/30/15 0600  Gross per 24 hour  Intake              492 ml  Output             1850 ml  Net            -1358 ml    Telemetry: Sinus rhythm with PVCs and NSVT. 10/30/2015   Exam:  General: No distress.  HEENT: normal  Neck: supple  Lungs: CTA  Cardiac: RRR AICD under left clavicle PMI increased   Abdomen: NT/ND, no masses  Extremities: No pitting edema.  Neuro: grossly intact  Lab Results:  Basic Metabolic Panel:  Recent Labs Lab 10/27/15 0237 10/28/15 0233 10/29/15 0231  NA 132* 134* 135  K 4.0 3.8 4.4  CL 98* 99* 101  CO2 27 28 27   GLUCOSE 97 105* 99  BUN 22* 22* 20  CREATININE 1.37* 1.20 1.06  CALCIUM 8.9 9.0 9.1    CBC:  Recent Labs Lab 10/27/15 0237 10/28/15 0233 10/29/15 0231  WBC 7.1 6.6 5.3  HGB 11.6* 10.9* 11.1*  HCT 36.5* 34.7* 35.5*  MCV 95.3 95.6 95.2  PLT 279 266 266    Cardiac Enzymes:  Recent Labs Lab 10/25/15 1655 10/25/15 2330 10/26/15 0250  TROPONINI <0.03 <0.03 <0.03   Echocardiogram 12/09/2014: Study Conclusions  - Left ventricle: Systolic function is severely reduced, estimated   EF 15%. The cavity size was moderately  dilated. Features are   consistent with a pseudonormal left ventricular filling pattern,   with concomitant abnormal relaxation and increased filling   pressure (grade 2 diastolic dysfunction). Doppler parameters are   consistent with high ventricular filling pressure. - Regional wall motion abnormality: Akinesis of the mid-apical   anterior, apical inferior, mid anterolateral, apical lateral, and   apical myocardium; severe hypokinesis of the mid anteroseptal,   mid inferior, and apical septal myocardium; moderate hypokinesis   of the basal anteroseptal, basal inferior, and basal-mid   inferolateral myocardium. - Aortic valve: Mildly calcified annulus. Trileaflet. There was   mild to moderate regurgitation. - Mitral valve: There was moderate regurgitation. - Left atrium: The atrium was severely dilated. Volume/bsa, ES,   (1-plane Simpson&'s, A2C): 48.1 ml/m^2. - Right ventricle: Pacer wire or catheter noted in right ventricle.   Systolic function was mildly reduced. - Right atrium: The atrium was mildly dilated.   Medications:   Scheduled Medications: . aspirin EC  81 mg Oral Daily  . atorvastatin  40 mg Oral q1800  . buPROPion  150 mg Oral  Daily  . carvedilol  6.25 mg Oral BID WC  . clopidogrel  75 mg Oral Daily  . enoxaparin (LOVENOX) injection  40 mg Subcutaneous Q24H  . furosemide  40 mg Oral Daily  . lisinopril  2.5 mg Oral Daily  . sodium chloride flush  3 mL Intravenous Q12H  . sodium chloride flush  3 mL Intravenous Q12H    Infusions:    PRN Medications: sodium chloride, sodium chloride, acetaminophen, ALPRAZolam, alum & mag hydroxide-simeth, HYDROcodone-acetaminophen, ondansetron (ZOFRAN) IV, sodium chloride flush, sodium chloride flush   Assessment:   1. Chest pain cath with patent stents medical Rx recommended d/c home today right radial Ok with good pulse and no hematoma  2. 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  3. Ischemic cardiomyopathy with LVEF approximately 15% by echocardiogram in October 2016. F/u [facility] consider adding aldactone   4. NSVT by monitor. Patient has [employer] ICD in place.   5. Essential hypertension  6. Hyperlipidemia, on statin therapy.   Plan/Discussion:    [facility]    Jaimie Redditt

## 2015-10-31 ENCOUNTER — Telehealth: Payer: Self-pay

## 2015-10-31 ENCOUNTER — Other Ambulatory Visit: Payer: Self-pay

## 2015-10-31 DIAGNOSIS — R0602 Shortness of breath: Secondary | ICD-10-CM

## 2015-10-31 MED ORDER — CARVEDILOL 6.25 MG PO TABS: 6.2500 mg | ORAL_TABLET | Freq: Two times a day (BID) | ORAL | 11 refills | Status: DC

## 2015-10-31 NOTE — Telephone Encounter (Signed)
Called pt to confirm the dosage of his Carvedilol increase from 3.125 mg BID to 6.24 mg BID. Pt instructed me that he just filled his prescription for the 3.125 mg RX yesterday. I told him that would be fine, that as far as this month is concerned he will continue to take 2 Tablets PO BID but when he goes to fill his RX next month the will have the 6.25 mg PO BID on file and he will resume take 1 tablet PO BID. He is agreeable and understood. I informed him that if he has any questions or concerns he could call his pharmacy to verify.

## 2015-10-31 NOTE — Telephone Encounter (Signed)
Pharmacy called to verify the dose change and send in appropriate RX. Upon discharge form the ED 10/25/15 pt is to increase Carvedilol to 2 tablets PO BID. I confirmed that with Emmit the Pharmacists and told him I would send in the new RX for 6.25 mg PO BID. I also told him I would follow up with the pt to ensure that he knows the dosage change and the the new RX had been sent to pharmacy. He is agreeable and understood.

## 2015-11-01 MED FILL — Verapamil HCl IV Soln 2.5 MG/ML: INTRAVENOUS | Qty: 2 | Status: AC

## 2015-11-13 ENCOUNTER — Telehealth: Payer: Self-pay | Admitting: Cardiology

## 2015-11-13 ENCOUNTER — Ambulatory Visit (INDEPENDENT_AMBULATORY_CARE_PROVIDER_SITE_OTHER): Payer: Self-pay | Admitting: Physician Assistant

## 2015-11-13 ENCOUNTER — Encounter: Payer: Self-pay | Admitting: Physician Assistant

## 2015-11-13 VITALS — BP 98/58 | HR 36 | Ht 71.0 in | Wt 198.0 lb

## 2015-11-13 DIAGNOSIS — Z95810 Presence of automatic (implantable) cardiac defibrillator: Secondary | ICD-10-CM

## 2015-11-13 DIAGNOSIS — R001 Bradycardia, unspecified: Secondary | ICD-10-CM

## 2015-11-13 DIAGNOSIS — E785 Hyperlipidemia, unspecified: Secondary | ICD-10-CM

## 2015-11-13 DIAGNOSIS — I5022 Chronic systolic (congestive) heart failure: Secondary | ICD-10-CM

## 2015-11-13 DIAGNOSIS — I255 Ischemic cardiomyopathy: Secondary | ICD-10-CM

## 2015-11-13 DIAGNOSIS — Z720 Tobacco use: Secondary | ICD-10-CM

## 2015-11-13 DIAGNOSIS — I2510 Atherosclerotic heart disease of native coronary artery without angina pectoris: Secondary | ICD-10-CM

## 2015-11-13 DIAGNOSIS — I10 Essential (primary) hypertension: Secondary | ICD-10-CM

## 2015-11-13 MED ORDER — CARVEDILOL 3.125 MG PO TABS: 1.5625 mg | ORAL_TABLET | Freq: Two times a day (BID) | ORAL | 3 refills | Status: DC

## 2015-11-13 NOTE — Patient Instructions (Signed)
Medications:  Decrease Carvedilol to 1/2 tab by mouth twice daily.   Labwork:  Your physician recommends that you return for lab work in: 6 weeks--LIPID/Hepatic Function   Other Instructions:  Remote monitoring is used to monitor your Pacemaker of ICD from home. This monitoring reduces the number of office visits required to check your device to one time per year. It allows us to keep an eye on the functioning of your device to ensure it is working properly. Please send remote transmission today when you get home.     Follow-Up:  Your physician recommends that you schedule a follow-up appointment in: 2 months with Dr. Jens Somrenshaw.  If you need a refill on your cardiac medications before your next appointment, please call your pharmacy.

## 2015-11-13 NOTE — Telephone Encounter (Signed)
Pt seen [name], [place] today. Provider wanted pt to send a remote transmission b/c pt heart rate was in the 30's. But when pt was hooked up to EKG his heart rate was in the 40's. Provider wanted patient to send a remote transmission to make sure pt was in VT or bradycardic. Called pt to help him with this. Pt wife answered and said she would have pt call me back when he gets home.

## 2015-11-13 NOTE — Progress Notes (Signed)
Cardiology Office Note    Date:  11/13/2015   ID:  [name], DOB Sep 28, 1955, MRN [id number]  PCP:  Tula Nakayama, MD  Cardiologist:  Dr. Jens Som  Chief Complaint  Patient presents with  . Hospitalization Follow-up    seen for Dr. Jens Som    History of Present Illness:  [name] is a 60 y.o. male with PMH of CAD s/p multiple PCI, ICM with EF 15%, chronic systolic HF, HTN, HLD, TIA, tobacco abuse, and h/o VT s/p Medtronic ICD. He had a cardiac catheterization in July 2014 showed normal left main, patent stents in the LAD with 80-90% second diagonal (jailed), 90% proximal left circumflex, occluded first OM, 80-90% second OM, EF 25%. He had PCI to his left circumflex and second OM at the time. He was admitted for aphasia and confusion in October 2016 and felt to have had a TIA. He could not undergo MRI due to pacemaker. He had been noncompliant with his home medication prior to that admission. Neurology recommended and a consultation with Coumadin, however internal medicine discussed with the cardiology team who felt he was not a good candidate given his poor compliance. Therefore he was continued on aspirin and Plavix. Interrogation of his device did not reveal any evidence of atrial fibrillation. 2-D echo obtained on 12/09/2014 showed EF 15%, grade 2 diastolic dysfunction, high ventricular filling pressure, multiple wall motion abnormality, mild to moderate AR, moderate MR, mildly dilated RA, severely dilated LA. He was admitted to [facility] in April for acute on chronic systolic heart failure, discharge weight was 200 pound. He presented back to the cardiology office on 07/18/2015 for evaluation of shortness of breath and elevated BNP. He did increase his Lasix to 80 mg daily for 2 weeks prior to that office visit. Optival device interrogation prior to that day showed he was close to his baseline. His Lasix was kept on the previous dose.  He was seen in the emergency  room on 10/18/2015 for centralized chest pain worse with deep inspiration. Serial troponin was negative 2. He did receive 2 doses of Dilaudid and one dose of morphine. CTA of the chest was negative for PE, however showed prominence of the ascending thoracic aorta measuring 4.1 x 4 cm, recommended annual imaging follow-up. His chest discomfort was different from his usual angina symptom. It was felt that his chest discomfort is likely noncardiac in nature. He did not appear to be fluid overloaded at the time. He was treated with muscle relaxant, anti-inflammatory and antianxiety medication. Patient later presented to [facility] on 10/24/2015 with substernal chest pain and was transferred to [facility] Acute and recurrent chest discomfort, he eventually underwent left and right cardiac catheterization on 10/29/2015 which showed 30% mid RCA lesion, 85% ostial D2 lesion which appears to be chronic and that was result of jailing by LAD stent, 99% ostial small OM1 lesion, no new stenosis to explain his chest discomfort, therefore medical therapy was recommended. Right heart cath showed cardiac output 4.72, cardiac index 2.26, mean wedge pressure 29 mmHg, LVEDP 22 mmHg. Findings consistent with mild pulmonary hypertension and volume overload.  Patient presents today for post hospital follow-up. His blood pressure is 98/58. Heart rate was initially 36 bpm, we did repeat an EKG that shows sinus rhythm, right bundle branch block, ventricular rate 49 bpm. It appears he has frequent PVC and PACs and his pulse may not be observable on manual pulse check. However I am concerned about this  slow heart rate, he has no current symptom of bradycardia, he has no dizziness or feeling of passing out. He says he used to be on 12.5 mg of carvedilol and he almost passed out from that. He says he cannot tolerate the 6.25 mg twice a day of carvedilol either, he is currently taking 3.125 mg carvedilol once a day instead of  twice a day. I will adjust his carvedilol dose to half a tablet of 3.125 mg twice a day. Otherwise he denies any chest pain. He does however has chronic shortness of breath which has been ongoing for several month. Note previous CTA of the chest was negative for PE. He also had a PFT in June of this year that showed mild obstructive disease. Overall I think his shortness of breath is likely related to his smoking history and combined with ejection fraction of 15%. I have strongly advised him to stop smoking. He also says he has also missed a lot of his statin medication, as he keeps forgetting taking his medication at night, he is due for repeat fasting lipid panel and LFT, I will repeat it in 6 weeks after he restarted on the statin medication.  He is scheduled to see Dr. Graciela Husbands in October, we will obtain interrogation today after his anemia remote transmission arriving home to see how slow his heart rate gets.   Past Medical History:  Diagnosis Date  . Anxiety   . Automatic implantable cardioverter-defibrillator in situ    a. Medtronic ICD.  [name] CAD (coronary artery disease)    a.  Severe LAD stenosis 2/2 acute thrombus - BMS 2009;  b. 06/01/11 Cath - LAD 20 isr, 51m (3.0x16 Veri-flex BMS), OM1 100p, EF 20-25%;  c. 07/2012 Abnl Cardiolite;  d. 08/2012 Cath/PCI: LM nl, LAD patent stents, D2 80-90 (jailed), LCX 90 p/m (4.0x24 Promus Premier DES), OM1 100, OM2 80-90p (3.0x20 Promus Premier DES), RCA patent mid stent, 40d, EF 25%.  e. 10/29/15 LHC stable disease with patent stents  . Chronic systolic CHF (congestive heart failure) (HCC)   . Depression   . History of rheumatic fever 1962  . HLD (hyperlipidemia) mixed  . HTN (hypertension)   . Ischemic cardiomyopathy    a.  EF 30-35% 2010;  b.  EF 20-25% by LV gram 08/2012. c. EF 15% by echo 11/2014.  [name] Myocardial infarction [facility]) 1998; 2002; ~ 2010  . Paroxysmal ventricular tachycardia (HCC)    a. VFlutter  CL 210 msec  Rx shock 04/2011  . PVC's (premature  ventricular contractions)   . RBBB   . TIA (transient ischemic attack)    a. Dx 11/2014, continued on ASA/Plavix.    Past Surgical History:  Procedure Laterality Date  . CARDIAC CATHETERIZATION     "lots of those" (08/29/2012)  . CARDIAC CATHETERIZATION N/A 10/29/2015   Procedure: Right/Left Heart Cath and Coronary Angiography;  Surgeon: Peter M Swaziland, MD;  Location: [facility] INVASIVE CV LAB;  Service: Cardiovascular;  Laterality: N/A;  . CARDIAC DEFIBRILLATOR PLACEMENT     primary prevention for sudden death; Dr. Graciela Husbands   . CORONARY ANGIOPLASTY WITH STENT PLACEMENT  2002-08/29/2012   "think I had 4 total then 2 today" to LAD 06/01/2011/notes (08/29/2012)  . defibrillator implantation  04/12/02   primary prevention for sudden death; Dr. Graciela Husbands   . HERNIA REPAIR     "w/gallbladder OR" (08/29/2012)  . LAPAROSCOPIC CHOLECYSTECTOMY    . LEFT HEART CATHETERIZATION WITH CORONARY ANGIOGRAM N/A 06/08/2011   Procedure: LEFT HEART CATHETERIZATION  WITH CORONARY ANGIOGRAM;  Surgeon: Dolores Pattyaniel R Bensimhon, MD;  [facility];  Service: Cardiovascular;  Laterality: N/A;  . PERCUTANEOUS CORONARY STENT INTERVENTION (PCI-S) N/A 06/01/2011   Procedure: PERCUTANEOUS CORONARY STENT INTERVENTION (PCI-S);  Surgeon: Peter M SwazilandJordan, MD;  [facility];  Service: Cardiovascular;  Laterality: N/A;  . PERCUTANEOUS CORONARY STENT INTERVENTION (PCI-S) N/A 08/29/2012   Procedure: PERCUTANEOUS CORONARY STENT INTERVENTION (PCI-S);  Surgeon: Peter M SwazilandJordan, MD;  [facility];  Service: Cardiovascular;  Laterality: N/A;    Current Medications: Outpatient Medications Prior to Visit  Medication Sig Dispense Refill  . ALPRAZolam (XANAX) 0.25 MG tablet Take 1 tablet (0.25 mg total) by mouth 3 (three) times daily as needed for anxiety. 10 tablet 0  . aspirin EC 81 MG tablet Take 81 mg by mouth daily.    [name]. atorvastatin (LIPITOR) 40 MG tablet Take 1 tablet (40 mg total) by mouth daily at 6 PM. 30 tablet 11  .  clotrimazole-betamethasone (LOTRISONE) cream Apply 1 application topically 2 (two) times daily. (Patient taking differently: Apply 1 application topically 2 (two) times daily as needed (eczema). ) 15 g 0  . furosemide (LASIX) 40 MG tablet Take 40 mg by mouth daily. Pt states that sometimes he will take med twice daily if needed    . lisinopril (PRINIVIL,ZESTRIL) 2.5 MG tablet Take 2.5 mg by mouth daily.    . nitroGLYCERIN (NITROSTAT) 0.4 MG SL tablet Place 1 tablet (0.4 mg total) under the tongue every 5 (five) minutes as needed. For chest pain 25 tablet 11  . carvedilol (COREG) 6.25 MG tablet Take 1 tablet (6.25 mg total) by mouth 2 (two) times daily with a meal. 60 tablet 11  . clopidogrel (PLAVIX) 75 MG tablet Take 1 tablet (75 mg total) by mouth daily. Take one table by mouth everyday with breakfast. (Patient taking differently: Take 75 mg by mouth daily. ) 30 tablet 11   No facility-administered medications prior to visit.      Allergies:   Review of patient's allergies indicates no known allergies.   Social History   Social History  . Marital status: Married    Spouse name: N/A  . Number of children: N/A  . Years of education: N/A   Social History Main Topics  . Smoking status: Current Every Day Smoker    Packs/day: 0.50    Years: 44.00    Types: Cigarettes    Last attempt to quit: 05/27/2011  . Smokeless tobacco: Never Used  . Alcohol use No     Comment: 08/29/2012 "6-8 beers once/wk". Has not had alcohol in the last 6-8 months  . Drug use: No  . Sexual activity: Not Currently   Other Topics Concern  . None   Social History Narrative   Married; full time.      Family History:  The patient's family history includes Cancer in his father and sister; Hypertension in his brother.   ROS:   Please see the history of present illness.    ROS All other systems reviewed and are negative.   PHYSICAL EXAM:   VS:  BP (!) 98/58   Pulse (!) 36   Ht 5\' 11"  (1.803 m)   Wt 198 lb  (89.8 kg)   BMI 27.62 kg/m    GEN: Well nourished, well developed, in no acute distress  HEENT: normal  Neck: no JVD, carotid bruits, or masses Cardiac: Irregular; no murmurs, rubs, or gallops,no edema  Respiratory:  clear to auscultation  bilaterally, normal work of breathing GI: soft, nontender, nondistended, + BS MS: no deformity or atrophy  Skin: warm and dry, no rash Neuro:  Alert and Oriented x 3, Strength and sensation are intact Psych: euthymic mood, full affect  Wt Readings from Last 3 Encounters:  11/13/15 198 lb (89.8 kg)  10/30/15 195 lb 8 oz (88.7 kg)  07/18/15 201 lb (91.2 kg)      Studies/Labs Reviewed:   EKG:  EKG is ordered today.  The ekg ordered today demonstrates sinus rhythm, right bundle branch block with left anterior fascicular block, heart rate 49.  Recent Labs: 12/11/2014: Magnesium 2.0; TSH 1.739 07/09/2015: Brain Natriuretic Peptide 324.1 10/18/2015: ALT 28 10/29/2015: BUN 20; Creatinine, Ser 1.06; Hemoglobin 11.1; Platelets 266; Potassium 4.4; Sodium 135   Lipid Panel    Component Value Date/Time   CHOL 183 12/09/2014 0310   TRIG 61 12/09/2014 0310   HDL 41 12/09/2014 0310   CHOLHDL 4.5 12/09/2014 0310   VLDL 12 12/09/2014 0310   LDLCALC 130 (H) 12/09/2014 0310   LDLDIRECT 159.2 02/04/2010 1622    Additional studies/ records that were reviewed today include:    Echo 12/09/2014 - Left ventricle: Systolic function is severely reduced, estimated   EF 15%. The cavity size was moderately dilated. Features are   consistent with a pseudonormal left ventricular filling pattern,   with concomitant abnormal relaxation and increased filling   pressure (grade 2 diastolic dysfunction). Doppler parameters are   consistent with high ventricular filling pressure. - Regional wall motion abnormality: Akinesis of the mid-apical   anterior, apical inferior, mid anterolateral, apical lateral, and   apical myocardium; severe hypokinesis of the mid  anteroseptal,   mid inferior, and apical septal myocardium; moderate hypokinesis   of the basal anteroseptal, basal inferior, and basal-mid   inferolateral myocardium. - Aortic valve: Mildly calcified annulus. Trileaflet. There was   mild to moderate regurgitation. - Mitral valve: There was moderate regurgitation. - Left atrium: The atrium was severely dilated. Volume/bsa, ES,   (1-plane Simpson&'s, A2C): 48.1 ml/m^2. - Right ventricle: Pacer wire or catheter noted in right ventricle.   Systolic function was mildly reduced. - Right atrium: The atrium was mildly dilated.    CTA of chest 10/18/2015 IMPRESSION: No demonstrable pulmonary embolus.  Prominence of the ascending thoracic aorta with a measured transverse diameter of 4.1 x 4.0 cm. Recommend annual imaging followup by CTA or MRA. This recommendation follows 2010 ACCF/AHA/AATS/ACR/ASA/SCA/SCAI/SIR/STS/SVM Guidelines for the Diagnosis and Management of Patients with Thoracic Aortic Disease. Circulation. 2010; 121: Z610-R604. Note that the contrast bolus is not timed to adequately assess for potential thoracic aortic dissection. If dissection of the thoracic aorta is of clinical concern, a repeat study with contrast timed to optimize aortic visualization would be warranted. The patient has a pacemaker which would obviate MR as a consideration for assessment of the aorta.  There is cardiomegaly. There is reflux of contrast into the inferior vena cava and hepatic veins suggesting a degree of increased right heart pressure.  There are foci of coronary artery calcification as well as areas of calcification in the aorta and proximal left subclavian artery.  Mildly enlarged pretracheal and sub- carinal lymph nodes of uncertain etiology.  Mild bibasilar lung atelectasis. No frank edema or consolidation Evident.  There is evidence of a degree of pulmonary arterial hypertension.    Cath 10/29/2015  Conclusion      Mid RCA lesion, 30 %stenosed.  Ost LAD to Mid LAD lesion, 10 %stenosed.  Ost 2nd Diag lesion, 85 %stenosed.  Ost 1st Mrg to 1st Mrg lesion, 99 %stenosed.  Ost Cx to Mid Cx lesion, 10 %stenosed.  Ost 2nd Mrg to 2nd Mrg lesion, 0 %stenosed.  Hemodynamic findings consistent with mild pulmonary hypertension.   1. 2 vessel obstructive CAD.      - 85% ostial second diagonal ( covered by LAD stent). This is a chronic finding     - 99% CTO of a small OM 1 2. Patent stents in the proximal to mid LAD, proximal to mid LCx, second OM, and mid RCA 3. Moderate pulmonary HTN 4. Elevated LV filling pressures with preserved cardiac output.   Plan: No new stenosis to explain his current symptoms. Recommend continued medical therapy.    Fick Cardiac Output 4.72 L/min  Fick Cardiac Output Index 2.26 (L/min)/BSA   RA A Wave 10 mmHg  RA V Wave 10 mmHg  RA Mean 8 mmHg  RV Systolic Pressure 60 mmHg  RV Diastolic Pressure 1 mmHg  RV EDP 11 mmHg  PA Systolic Pressure 58 mmHg  PA Diastolic Pressure 27 mmHg  PA Mean 39 mmHg  PW A Wave 25 mmHg  PW V Wave 40 mmHg  PW Mean 29 mmHg  AO Systolic Pressure 83 mmHg  AO Diastolic Pressure 57 mmHg  AO Mean 69 mmHg  LV Systolic Pressure 85 mmHg  LV Diastolic Pressure 7 mmHg  LV EDP 22 mmHg  Arterial Occlusion Pressure Extended Systolic Pressure 84 mmHg  Arterial Occlusion Pressure Extended Diastolic Pressure 51 mmHg  Arterial Occlusion Pressure Extended Mean Pressure 66 mmHg  Left Ventricular Apex Extended Systolic Pressure 88 mmHg  Left Ventricular Apex Extended Diastolic Pressure 5 mmHg  Left Ventricular Apex Extended EDP Pressure 16 mmHg  QP/QS 1  TPVR Index 17.26 HRUI  TSVR Index 30.54 HRUI  PVR SVR Ratio 0.16  TPVR/TSVR Ratio 0.57     ASSESSMENT:    1. Coronary artery disease involving native coronary artery of native heart without angina pectoris   2. Cardiomyopathy, ischemic with baseline EF 15%   3. Chronic systolic heart  failure (HCC)   4. Essential hypertension   5. Hyperlipidemia   6. ICD (implantable cardioverter-defibrillator) in place   7. Tobacco abuse   8. Bradycardia      PLAN:  In order of problems listed above:  1. CAD: leftft and right cardiac catheterization on 10/29/2015 which showed 30% mid RCA lesion, 85% ostial D2 lesion which appears to be chronic and that was result of jailing by LAD stent, 99% ostial small OM1 lesion, no new stenosis to explain his chest discomfort, therefore medical therapy was recommended. Right heart cath showed cardiac output 4.72, cardiac index 2.26, mean wedge pressure 29 mmHg, LVEDP 22 mmHg.  - He denies any chest pain today. His shortness of breath is likely related to underlying significant ischemic cardiomyopathy and tobacco abuse.  2. Bradycardia: EKG today shows heart rate is in the high 40s, denies any dizziness or feeling of passing out. Initially, device interpreted his heart rate is low 30s, however this does not appear to be the case on EKG. I stood by EKG machine and reviewed his trip, he does have frequent PAC and PVCs. Given his significant cardiac history, I am concerned that his heart rate does dip down. I have asked him to send in a remote transmission after arriving at home, to see if he has had any recent prolonged VT episode or bradycardia. If no significant event was seen on  device interrogation, that he can follow-up in 2 months with Dr. Jens Som. He is scheduled to see Dr. Graciela Husbands next month.  3. ICM/Chronic systolic heart failure with baseline EF 15%: Despite the recent right heart cath indicating his mildly fluid overloaded, he does not appear to be so on physical exam today. We'll continue on current medication  4. HTN: Blood pressure appears to be low, he is on the lowest possible dose of carvedilol and lisinopril. He actually has not been taking 6.25 twice a day of carvedilol due to symptomatic side effect such as dizziness, he is doing well on  3.125 mg daily of carvedilol. I have asked him to break this lowest dose in half and take half in the morning and half in the afternoon especially given his bradycardia. Hopefully this will give him the benefit of VT suppression without dropping his heart rate too much.  5. HLD: Last lipid panel obtained in October 2016 showed total cholesterol 183, triglycerides 61, HDL 41, LDL 130. He is due for repeat, however patient has not been compliant with his statin medications, he says he tends to forget about taking the statin medication in the afternoon, I have advised him to take it daily in the morning and recheck his fasting lipid and LFT in 6 weeks.  6. H/o VT s/p medtronic ICD: Denies any recent device shock.  7. Tobacco abuse: Discussed the need for tobacco cessation    Medication Adjustments/Labs and Tests Ordered: Current medicines are reviewed at length with the patient today.  Concerns regarding medicines are outlined above.  Medication changes, Labs and Tests ordered today are listed in the Patient Instructions below. Patient Instructions  Medications:  Decrease Carvedilol to 1/2 tab by mouth twice daily.   Labwork:  Your physician recommends that you return for lab work in: 6 weeks--LIPID/Hepatic Function   Other Instructions:  Remote monitoring is used to monitor your Pacemaker of ICD from home. This monitoring reduces the number of office visits required to check your device to one time per year. It allows [place] to keep an eye on the functioning of your device to ensure it is working properly. Please send remote transmission today when you get home.     Follow-Up:  Your physician recommends that you schedule a follow-up appointment in: 2 months with Dr. Jens Som.  If you need a refill on your cardiac medications before your next appointment, please call your pharmacy.       Ramond Dial, [place]  11/13/2015 1:13 PM    [facility] [address]  [place], [place], [place]  [address] Phone: [phone]; Fax: [phone]

## 2015-11-14 NOTE — Telephone Encounter (Signed)
Transmission received. Printed report and gave to device tech for review.

## 2015-11-14 NOTE — Telephone Encounter (Signed)
Interpretation of remote sent to Azalee CourseHao Meng, [place] via staff message.

## 2015-11-18 ENCOUNTER — Telehealth: Payer: Self-pay | Admitting: Cardiology

## 2015-11-18 NOTE — Addendum Note (Signed)
Addended by: Evans LanceSTOVER, Ronie Fleeger W on: 11/18/2015 10:28 AM   Modules accepted: Orders

## 2015-11-18 NOTE — Telephone Encounter (Signed)
New Message  Pt call requesting to speak with RN. Pt states he has a couple of questions for the RN. Pt states when laying down he has a lot of pressure and he does not know if it is because of his bp or his medication. Pt would like to discuss with the Rn. Please call back to discuss

## 2015-11-18 NOTE — Telephone Encounter (Signed)
Pt of Dr. Jens Somrenshaw  c/o head 'pressure' when he lays down at night - "feels like I'm standing on my head". Notes it occurs when he is reclined at a 45 degree angle or flatter. Trouble going to sleep with this problem.  BP or med problem? Mentioned to P[place]Hao when seen 9/20, doesn't recall if he said anything - nothing mentioned in notes.  Informs of recent med changes - he added Lexapro when seen by PCP 9/20, also [facility], notes improvement with his shortness of breath and other issues. no change to his 'pressure' symptom.  Notes problem started around time of catheterization on 10/29/15 "give or take a few days before or after".  Not sure if this could be BP med related - he notes he doesn't have a BP cuff at home. He is not having any symptoms when standing or sitting. Denies dizziness, orthopnea, other symptoms. Pt aware I will request recommendations from MD.

## 2015-11-18 NOTE — Telephone Encounter (Signed)
Discussed w patient. Advised PCP follow up for further recommendations. He voiced understanding and thanks.

## 2015-11-18 NOTE — Telephone Encounter (Signed)
Doubt symptoms related to heart or cardiac meds [name] MillersBrian Crenshaw

## 2015-11-19 ENCOUNTER — Telehealth: Payer: Self-pay | Admitting: Physician Assistant

## 2015-11-19 NOTE — Telephone Encounter (Signed)
Paged by the answering service. Recent was in asleep on the couch and suddenly felt possible shock from ICD. It was mild. He has mild chest pressure after worse. Patient denies any chest pain, shortness of breath, orthopnea, PND or syncope. The patient states that his ICD sending transmission remotely constantly. Advised to go to the ER for further evaluation.

## 2015-11-20 ENCOUNTER — Telehealth: Payer: Self-pay

## 2015-11-20 NOTE — Telephone Encounter (Signed)
Pt called concerned that he may have received a shock overnight. I informed patient that in reviewing his transmission there were no episodes. He verbalized thanks and will call with any other concerns.

## 2015-11-26 NOTE — Progress Notes (Signed)
Dictation #1 [id number]  [id number]      HPI: FU CAD s/p multiple stents, paroxysmal ventricular tachycardia, ischemic cardiomyopathy, chronic systolic heart failure s/p Medtronic AICD. 2D Echo 12/09/14: EF 15%, grade 2 DD, high ventricular filling pressures, multiple WMA, mild-mod AI, mod MR, mildly dilated RA, severely dilated LA. CTA August 2017 showed ascending aorta 4.1 x 4 cm. Cardiac catheterization September 2017 showed an 85% second diagonal (covered by LAD stent; chronic), 99% first marginal (chronic) and patent stents in the proximal to mid LAD, proximal to mid circumflex, second obtuse marginal and mid right coronary artery. Left ventricular end-diastolic pressure 22 and PA pressure 58/27. Medical therapy recommended. Since last seen, Patient notes some dyspnea on exertion and orthopnea but no PND or pedal edema. No chest pain, palpitations or syncope.  Current Outpatient Prescriptions  Medication Sig Dispense Refill  . ALPRAZolam (XANAX) 0.25 MG tablet Take 1 tablet (0.25 mg total) by mouth 3 (three) times daily as needed for anxiety. 10 tablet 0  . aspirin EC 81 MG tablet Take 81 mg by mouth daily.    [name] atorvastatin (LIPITOR) 40 MG tablet Take 1 tablet (40 mg total) by mouth daily at 6 PM. 30 tablet 11  . carvedilol (COREG) 3.125 MG tablet Take 0.5 tablets (1.5625 mg total) by mouth 2 (two) times daily with a meal. 90 tablet 3  . clopidogrel (PLAVIX) 75 MG tablet Take 75 mg by mouth every morning.    . clotrimazole-betamethasone (LOTRISONE) cream Apply 1 application topically 2 (two) times daily. (Patient taking differently: Apply 1 application topically 2 (two) times daily as needed (eczema). ) 15 g 0  . Escitalopram Oxalate (LEXAPRO PO) Take 1 tablet by mouth daily.    . furosemide (LASIX) 40 MG tablet Take 40 mg by mouth daily. Pt states that sometimes he will take med twice daily if needed    . HYDROXYZINE HCL PO Take 1 tablet by mouth daily.    [name] lisinopril  (PRINIVIL,ZESTRIL) 2.5 MG tablet Take 2.5 mg by mouth daily.    . nitroGLYCERIN (NITROSTAT) 0.4 MG SL tablet Place 1 tablet (0.4 mg total) under the tongue every 5 (five) minutes as needed. For chest pain 25 tablet 11   No current facility-administered medications for this visit.      Past Medical History:  Diagnosis Date  . Anxiety   . Automatic implantable cardioverter-defibrillator in situ    a. Medtronic ICD.  [name] CAD (coronary artery disease)    a.  Severe LAD stenosis 2/2 acute thrombus - BMS 2009;  b. 06/01/11 Cath - LAD 20 isr, 75m (3.0x16 Veri-flex BMS), OM1 100p, EF 20-25%;  c. 07/2012 Abnl Cardiolite;  d. 08/2012 Cath/PCI: LM nl, LAD patent stents, D2 80-90 (jailed), LCX 90 p/m (4.0x24 Promus Premier DES), OM1 100, OM2 80-90p (3.0x20 Promus Premier DES), RCA patent mid stent, 40d, EF 25%.  e. 10/29/15 LHC stable disease with patent stents  . Chronic systolic CHF (congestive heart failure) (HCC)   . Depression   . History of rheumatic fever 1962  . HLD (hyperlipidemia) mixed  . HTN (hypertension)   . Ischemic cardiomyopathy    a.  EF 30-35% 2010;  b.  EF 20-25% by LV gram 08/2012. c. EF 15% by echo 11/2014.  [name] Myocardial infarction 1998; 2002; ~ 2010  . Paroxysmal ventricular tachycardia (HCC)    a. VFlutter  CL 210 msec  Rx shock 04/2011  . PVC's (premature ventricular contractions)   . RBBB   . TIA (transient  ischemic attack)    a. Dx 11/2014, continued on ASA/Plavix.    Past Surgical History:  Procedure Laterality Date  . CARDIAC CATHETERIZATION     "lots of those" (08/29/2012)  . CARDIAC CATHETERIZATION N/A 10/29/2015   Procedure: Right/Left Heart Cath and Coronary Angiography;  Surgeon: Peter M SwazilandJordan, MD;  [facility];  Service: Cardiovascular;  Laterality: N/A;  . CARDIAC DEFIBRILLATOR PLACEMENT     primary prevention for sudden death; Dr. Graciela HusbandsKlein   . CORONARY ANGIOPLASTY WITH STENT PLACEMENT  2002-08/29/2012   "think I had 4 total then 2 today" to LAD  06/01/2011/notes (08/29/2012)  . defibrillator implantation  04/12/02   primary prevention for sudden death; Dr. Graciela HusbandsKlein   . HERNIA REPAIR     "w/gallbladder OR" (08/29/2012)  . LAPAROSCOPIC CHOLECYSTECTOMY    . LEFT HEART CATHETERIZATION WITH CORONARY ANGIOGRAM N/A 06/08/2011   Procedure: LEFT HEART CATHETERIZATION WITH CORONARY ANGIOGRAM;  Surgeon: Dolores Pattyaniel R Bensimhon, MD;  [facility];  Service: Cardiovascular;  Laterality: N/A;  . PERCUTANEOUS CORONARY STENT INTERVENTION (PCI-S) N/A 06/01/2011   Procedure: PERCUTANEOUS CORONARY STENT INTERVENTION (PCI-S);  Surgeon: Peter M SwazilandJordan, MD;  [facility];  Service: Cardiovascular;  Laterality: N/A;  . PERCUTANEOUS CORONARY STENT INTERVENTION (PCI-S) N/A 08/29/2012   Procedure: PERCUTANEOUS CORONARY STENT INTERVENTION (PCI-S);  Surgeon: Peter M SwazilandJordan, MD;  [facility];  Service: Cardiovascular;  Laterality: N/A;    Social History   Social History  . Marital status: Married    Spouse name: N/A  . Number of children: N/A  . Years of education: N/A   Occupational History  . Not on file.   Social History Main Topics  . Smoking status: Current Every Day Smoker    Packs/day: 0.50    Years: 44.00    Types: Cigarettes    Last attempt to quit: 05/27/2011  . Smokeless tobacco: Never Used  . Alcohol use No     Comment: 08/29/2012 "6-8 beers once/wk". Has not had alcohol in the last 6-8 months  . Drug use: No  . Sexual activity: Not Currently   Other Topics Concern  . Not on file   Social History Narrative   Married; full time.     Family History  Problem Relation Age of Onset  . Cancer Father   . Cancer Sister     twin sister and only one has cancer, unknown what kind  . Hypertension Brother   . Cancer      family hx  . Heart attack Neg Hx   . Stroke Neg Hx     ROS: fatigue but no fevers or chills, productive cough, hemoptysis, dysphasia, odynophagia, melena, hematochezia, dysuria, hematuria, rash, seizure  activity, orthopnea, PND, pedal edema, claudication. Remaining systems are negative.  Physical Exam: Well-developed well-nourished in no acute distress.  Skin is warm and dry.  HEENT is normal.  Neck is supple.  Chest is clear to auscultation with normal expansion.  Cardiovascular exam is regular rate and rhythm.  Abdominal exam nontender or distended. No masses palpated. Extremities show no edema. neuro grossly intact  A/P-sinus rhythm with second-degree AV block Mobitz type I, right bundle branch block, left anterior fascicular block.  1 ICD-followed by electrophysiology; patient notes some increased dyspnea on exertion and fatigue. His electrocardiogram shows probable Mobitz 1. I discussed the patient with Dr. Johney FrameAllred. He will likely need his device upgraded to a dual-chamber device. I will hold his carvedilol and Dr. Johney FrameAllred will contact him  with an appointment for Monday. He notes some increased dyspnea and fatigue likely due to loss of AV synchrony. Increase Lasix to 40 mg twice a day for 2 days and then resume 40 mg daily thereafter.  2 coronary artery disease-continue aspirin and statin.  3 ischemic cardiomyopathy-continue ACE inhibitor and beta blocker.  4 chronic systolic congestive heart failure-continue present dose of Lasix. Patient is euvolemic on examination.  5 hypertension-blood pressure controlled. Continue present medications.  6 hyperlipidemia-continue statin.  7 tobacco abuse-patient counseled on discontinuing.  8 prior TIA-continue aspirin and Plavix.   9 thoracic aortic aneurysm-follow-up CTA August 2018.    Olga Millers, MD

## 2015-11-29 ENCOUNTER — Encounter: Payer: Self-pay | Admitting: Cardiology

## 2015-11-29 ENCOUNTER — Ambulatory Visit (INDEPENDENT_AMBULATORY_CARE_PROVIDER_SITE_OTHER): Payer: Self-pay | Admitting: Cardiology

## 2015-11-29 VITALS — BP 130/58 | HR 53 | Ht 71.0 in | Wt 204.0 lb

## 2015-11-29 DIAGNOSIS — E784 Other hyperlipidemia: Secondary | ICD-10-CM

## 2015-11-29 DIAGNOSIS — Z720 Tobacco use: Secondary | ICD-10-CM

## 2015-11-29 DIAGNOSIS — I10 Essential (primary) hypertension: Secondary | ICD-10-CM

## 2015-11-29 DIAGNOSIS — I2510 Atherosclerotic heart disease of native coronary artery without angina pectoris: Secondary | ICD-10-CM

## 2015-11-29 DIAGNOSIS — I255 Ischemic cardiomyopathy: Secondary | ICD-10-CM

## 2015-11-29 DIAGNOSIS — E7849 Other hyperlipidemia: Secondary | ICD-10-CM

## 2015-11-29 NOTE — Patient Instructions (Signed)
Medication Instructions:   STOP CARVEDILOL  INCREASE FUROSEMIDE TO 40 MG TWICE DAILY X 2 DAYS AND THEN DECREASE TO 40 MG ONCE DAILY  Follow-Up:  Your physician recommends that you schedule a follow-up appointment in: 8 WEEKS WITH DR CRENSHAW   EP TO CALL AND SCHEDULE APPOINTMENT

## 2015-12-03 ENCOUNTER — Ambulatory Visit (INDEPENDENT_AMBULATORY_CARE_PROVIDER_SITE_OTHER): Payer: Self-pay | Admitting: Internal Medicine

## 2015-12-03 ENCOUNTER — Encounter: Payer: Self-pay | Admitting: Internal Medicine

## 2015-12-03 VITALS — BP 124/78 | HR 68 | Ht 71.0 in | Wt 199.6 lb

## 2015-12-03 DIAGNOSIS — I472 Ventricular tachycardia: Secondary | ICD-10-CM

## 2015-12-03 DIAGNOSIS — I255 Ischemic cardiomyopathy: Secondary | ICD-10-CM

## 2015-12-03 DIAGNOSIS — I493 Ventricular premature depolarization: Secondary | ICD-10-CM

## 2015-12-03 DIAGNOSIS — I4729 Other ventricular tachycardia: Secondary | ICD-10-CM

## 2015-12-03 DIAGNOSIS — Z95810 Presence of automatic (implantable) cardiac defibrillator: Secondary | ICD-10-CM

## 2015-12-03 NOTE — Progress Notes (Signed)
Patient Care Team: Tula NakayamaPhillip Jerome Karam, MD as PCP - General (Family Medicine) Lewayne BuntingBrian S Crenshaw, MD as PCP - Cardiology (Cardiology) No Pcp Per Patient (General Practice)   HPI  [name] is a 60 y.o. male is seen in followup for ICD implanted as per the MASTER trial with prior myocardial infarction complicated by shock and prior stenting.  Underwent catheterization in 2010 demonstrated ejection fraction of 35% and severe LAD stenosis secondary to acute thrombus.   Because of ventricular tachycardia and exertional chest pain shortness of breath he underwent catheterization April 2012 demonstrating a new lesion in the mid LAD that was felt to be critical;  proximal mid LAD stents and mid RCA stents were patent. LVEF was 20-25%    Left heart cath 4/13 >>There is a long stented segment in the proximal to mid LAD is patent. There is diffuse 10-20% narrowing within the stent. The recently placed stent in the mid LAD is widely patent. It jails the ostium of the second diagonal branch with a 90% lesion. Theres is TIMI-3 in the ostium of the D2. Left circumflex (LCx): A left circumflex is a large vessel. There is a 50-60% stenosis in the proximal vessel. He first obtuse marginal vessel is very small in caliber and is sub-totally occluded proximally. The second and third marginal branches are large vessels. There is 30% lesion in ostium of OM-2  Right coronary artery (RCA): The right coronary has an inferior take off. It is a codominant vessel. There is 40% narrowing at the ostium. A stent is noted in the mid right coronary and is widely patent with less than 10-20% irregularities. There is a 30-40% lesion in the distal right coronary. Stable CAD as described above. The D2 is jailed by the recently placed LAD stent but there is good flow He underwent stenting again in July 2014 Cardiac catheterization September 2017 showed an 85% second diagonal (covered by LAD stent; chronic), 99% first marginal  (chronic) and patent stents in the proximal to mid LAD, proximal to mid circumflex, second obtuse marginal and mid right coronary artery; medical therapy recommended.   Echo 10/16  EF 15%    He was seen by Dr. Marsa ArisBC last week. At that time he was noted to be in Mobitz 1 heart block. Looking back over the summer he has had sporadic episodes of feeling quite weak. He has been tired. Over the last couple of weeks he has felt considerably worse. While he was in-hospital 9/17 telemetry demonstrated slow ventricular tachycardia but no heart block.      Past Medical History:  Diagnosis Date  . Anxiety   . Automatic implantable cardioverter-defibrillator in situ    a. Medtronic ICD.  [name]. CAD (coronary artery disease)    a.  Severe LAD stenosis 2/2 acute thrombus - BMS 2009;  b. 06/01/11 Cath - LAD 20 isr, 7926m (3.0x16 Veri-flex BMS), OM1 100p, EF 20-25%;  c. 07/2012 Abnl Cardiolite;  d. 08/2012 Cath/PCI: LM nl, LAD patent stents, D2 80-90 (jailed), LCX 90 p/m (4.0x24 Promus Premier DES), OM1 100, OM2 80-90p (3.0x20 Promus Premier DES), RCA patent mid stent, 40d, EF 25%.  e. 10/29/15 LHC stable disease with patent stents  . Chronic systolic CHF (congestive heart failure) (HCC)   . Depression   . History of rheumatic fever 1962  . HLD (hyperlipidemia) mixed  . HTN (hypertension)   . Ischemic cardiomyopathy    a.  EF 30-35% 2010;  b.  EF 20-25% by LV gram 08/2012.  c. EF 15% by echo 11/2014.  [name] Myocardial infarction 1998; 2002; ~ 2010  . Paroxysmal ventricular tachycardia (HCC)    a. VFlutter  CL 210 msec  Rx shock 04/2011  . PVC's (premature ventricular contractions)   . RBBB   . TIA (transient ischemic attack)    a. Dx 11/2014, continued on ASA/Plavix.    Past Surgical History:  Procedure Laterality Date  . CARDIAC CATHETERIZATION     "lots of those" (08/29/2012)  . CARDIAC CATHETERIZATION N/A 10/29/2015   Procedure: Right/Left Heart Cath and Coronary Angiography;  Surgeon: Peter M Swaziland, MD;  Location: [facility]  INVASIVE CV LAB;  Service: Cardiovascular;  Laterality: N/A;  . CARDIAC DEFIBRILLATOR PLACEMENT     primary prevention for sudden death; Dr. Graciela Husbands   . CORONARY ANGIOPLASTY WITH STENT PLACEMENT  2002-08/29/2012   "think I had 4 total then 2 today" to LAD 06/01/2011/notes (08/29/2012)  . defibrillator implantation  04/12/02   primary prevention for sudden death; Dr. Graciela Husbands   . HERNIA REPAIR     "w/gallbladder OR" (08/29/2012)  . LAPAROSCOPIC CHOLECYSTECTOMY    . LEFT HEART CATHETERIZATION WITH CORONARY ANGIOGRAM N/A 06/08/2011   Procedure: LEFT HEART CATHETERIZATION WITH CORONARY ANGIOGRAM;  Surgeon: Dolores Patty, MD;  [facility] C[facility] LAB;  Service: Cardiovascular;  Laterality: N/A;  . PERCUTANEOUS CORONARY STENT INTERVENTION (PCI-S) N/A 06/01/2011   Procedure: PERCUTANEOUS CORONARY STENT INTERVENTION (PCI-S);  Surgeon: Peter M Swaziland, MD;  Location: [facility] CATH LAB;  Service: Cardiovascular;  Laterality: N/A;  . PERCUTANEOUS CORONARY STENT INTERVENTION (PCI-S) N/A 08/29/2012   Procedure: PERCUTANEOUS CORONARY STENT INTERVENTION (PCI-S);  Surgeon: Peter M Swaziland, MD;  [facility] C[facility] LAB;  Service: Cardiovascular;  Laterality: N/A;    Current Outpatient Prescriptions  Medication Sig Dispense Refill  . ALPRAZolam (XANAX) 0.25 MG tablet Take 1 tablet (0.25 mg total) by mouth 3 (three) times daily as needed for anxiety. 10 tablet 0  . aspirin EC 81 MG tablet Take 81 mg by mouth daily.    [name] atorvastatin (LIPITOR) 40 MG tablet Take 1 tablet (40 mg total) by mouth daily at 6 PM. 30 tablet 11  . clopidogrel (PLAVIX) 75 MG tablet Take 75 mg by mouth every morning.    . clotrimazole-betamethasone (LOTRISONE) cream Apply 1 application topically 2 (two) times daily. (Patient taking differently: Apply 1 application topically 2 (two) times daily as needed (eczema). ) 15 g 0  . Escitalopram Oxalate (LEXAPRO PO) Take 1 tablet by mouth daily.    . furosemide (LASIX) 40 MG tablet Take 40 mg by mouth daily. Pt states  that sometimes he will take med twice daily if needed    . HYDROXYZINE HCL PO Take 1 tablet by mouth daily.    [name] lisinopril (PRINIVIL,ZESTRIL) 2.5 MG tablet Take 2.5 mg by mouth daily.    . nitroGLYCERIN (NITROSTAT) 0.4 MG SL tablet Place 1 tablet (0.4 mg total) under the tongue every 5 (five) minutes as needed. For chest pain 25 tablet 11   No current facility-administered medications for this visit.     No Known Allergies  Review of Systems negative except from HPI and PMH  Physical Exam BP 124/78 (BP Location: Left Arm, Patient Position: Sitting, Cuff Size: Normal)   Pulse 68   Ht 5\' 11"  (1.803 m)   Wt 199 lb 9.6 oz (90.5 kg)   SpO2 98%   BMI 27.84 kg/m  Well developed and nourished in no acute distress HENT normal Neck supple with JVP-flat Clear Regular rate  and rhythm, no murmurs or gallops Abd-soft with active BS No Clubbing cyanosis edema Skin-warm and dry A & Oriented  Grossly normal sensory and motor function tearful  ECG demonstrates sinus rhythm at  About 95 with  3:2 ; 2:1 and 1:1 : condution  Right bundle branch block left axis deviation Septal MI   Assessment and  Plan  Ischemic cardiomyopathy Without symptoms of ischemia  AV Heart block-Mobitz : 1 and 2-1   Ventricular tachycardia No intercurrent Ventricular tachycardia  Implantable defibrillator -Medtronic  Single chamberThe patient's device was interrogated.  The information was reviewed. No changes were made in the programming.    \ The pt has Av  Block and potential attributle symptoms;  It is not yet clear and the question will be how much persists and can we correlate symptoms to justify upgrade  With his right bundle branch block, there might be some potential value in CRT upgrade although with little enthusiasm    I spoke with Dr. Marsa Aris about the patient; he feels strongly that the patient's symptoms relate to his bradycardia.  We spent more than 50% of our >25 min visit in face to face  counseling regarding the above

## 2015-12-03 NOTE — Patient Instructions (Signed)
Medication Instructions: - Your physician recommends that you continue on your current medications as directed. Please refer to the Current Medication list given to you today.  Labwork: - none ordered  Procedures/Testing: - Your physician has recommended that you wear a 24 hour holter monitor (to be worn the beginning of next week). Holter monitors are medical devices that record the heart's electrical activity. Doctors most often use these monitors to diagnose arrhythmias. Arrhythmias are problems with the speed or rhythm of the heartbeat. The monitor is a small, portable device. You can wear one while you do your normal daily activities. This is usually used to diagnose what is causing palpitations/syncope (passing out).  Follow-Up: - Remote monitoring is used to monitor your Pacemaker of ICD from home. This monitoring reduces the number of office visits required to check your device to one time per year. It allows us to keep an eye on the functioning of your device to ensure it is working properly. You are scheduled for a device check from home on 03/03/16. You may send your transmission at any time that day. If you have a wireless device, the transmission will be sent automatically. After your physician reviews your transmission, you will receive a postcard with your next transmission date.  - Your physician wants you to follow-up in: 1 year with Dr. Graciela HusbandsKlein. You will receive a reminder letter in the mail two months in advance. If you don't receive a letter, please call our office to schedule the follow-up appointment.   Any Additional Special Instructions Will Be Listed Below (If Applicable).     If you need a refill on your cardiac medications before your next appointment, please call your pharmacy.

## 2015-12-06 ENCOUNTER — Inpatient Hospital Stay (HOSPITAL_COMMUNITY)
Admission: EM | Admit: 2015-12-06 | Discharge: 2015-12-14 | DRG: 226 | Disposition: A | Discharge status: Home Health | Payer: Medicaid Other | Attending: Cardiology | Admitting: Cardiology

## 2015-12-06 ENCOUNTER — Encounter (HOSPITAL_COMMUNITY): Payer: Self-pay | Admitting: Emergency Medicine

## 2015-12-06 ENCOUNTER — Telehealth: Payer: Self-pay | Admitting: Internal Medicine

## 2015-12-06 ENCOUNTER — Emergency Department (HOSPITAL_COMMUNITY): Payer: Medicaid Other

## 2015-12-06 DIAGNOSIS — E782 Mixed hyperlipidemia: Secondary | ICD-10-CM | POA: Diagnosis present

## 2015-12-06 DIAGNOSIS — Z959 Presence of cardiac and vascular implant and graft, unspecified: Secondary | ICD-10-CM

## 2015-12-06 DIAGNOSIS — I5023 Acute on chronic systolic (congestive) heart failure: Secondary | ICD-10-CM | POA: Diagnosis present

## 2015-12-06 DIAGNOSIS — I252 Old myocardial infarction: Secondary | ICD-10-CM

## 2015-12-06 DIAGNOSIS — I442 Atrioventricular block, complete: Principal | ICD-10-CM | POA: Diagnosis present

## 2015-12-06 DIAGNOSIS — Z8673 Personal history of transient ischemic attack (TIA), and cerebral infarction without residual deficits: Secondary | ICD-10-CM

## 2015-12-06 DIAGNOSIS — Z7902 Long term (current) use of antithrombotics/antiplatelets: Secondary | ICD-10-CM | POA: Diagnosis not present

## 2015-12-06 DIAGNOSIS — Z95810 Presence of automatic (implantable) cardiac defibrillator: Secondary | ICD-10-CM | POA: Diagnosis not present

## 2015-12-06 DIAGNOSIS — Z7982 Long term (current) use of aspirin: Secondary | ICD-10-CM

## 2015-12-06 DIAGNOSIS — Z79899 Other long term (current) drug therapy: Secondary | ICD-10-CM

## 2015-12-06 DIAGNOSIS — F329 Major depressive disorder, single episode, unspecified: Secondary | ICD-10-CM | POA: Diagnosis present

## 2015-12-06 DIAGNOSIS — I5084 End stage heart failure: Secondary | ICD-10-CM | POA: Diagnosis present

## 2015-12-06 DIAGNOSIS — Z955 Presence of coronary angioplasty implant and graft: Secondary | ICD-10-CM

## 2015-12-06 DIAGNOSIS — I472 Ventricular tachycardia: Secondary | ICD-10-CM | POA: Diagnosis present

## 2015-12-06 DIAGNOSIS — F419 Anxiety disorder, unspecified: Secondary | ICD-10-CM | POA: Diagnosis present

## 2015-12-06 DIAGNOSIS — F17210 Nicotine dependence, cigarettes, uncomplicated: Secondary | ICD-10-CM | POA: Diagnosis present

## 2015-12-06 DIAGNOSIS — E785 Hyperlipidemia, unspecified: Secondary | ICD-10-CM | POA: Diagnosis present

## 2015-12-06 DIAGNOSIS — I110 Hypertensive heart disease with heart failure: Secondary | ICD-10-CM | POA: Diagnosis present

## 2015-12-06 DIAGNOSIS — R0602 Shortness of breath: Secondary | ICD-10-CM | POA: Diagnosis present

## 2015-12-06 DIAGNOSIS — I2510 Atherosclerotic heart disease of native coronary artery without angina pectoris: Secondary | ICD-10-CM | POA: Diagnosis present

## 2015-12-06 DIAGNOSIS — I4510 Unspecified right bundle-branch block: Secondary | ICD-10-CM | POA: Diagnosis present

## 2015-12-06 DIAGNOSIS — I5022 Chronic systolic (congestive) heart failure: Secondary | ICD-10-CM | POA: Diagnosis present

## 2015-12-06 DIAGNOSIS — I255 Ischemic cardiomyopathy: Secondary | ICD-10-CM | POA: Diagnosis present

## 2015-12-06 HISTORY — DX: Chronic systolic (congestive) heart failure: I50.22

## 2015-12-06 HISTORY — DX: Dyspnea, unspecified: R06.00

## 2015-12-06 LAB — CBC
HCT: 40.6 % (ref 39.0–52.0)
HEMOGLOBIN: 13 g/dL (ref 13.0–17.0)
MCH: 30 pg (ref 26.0–34.0)
MCHC: 32 g/dL (ref 30.0–36.0)
MCV: 93.8 fL (ref 78.0–100.0)
Platelets: 223 10*3/uL (ref 150–400)
RBC: 4.33 MIL/uL (ref 4.22–5.81)
RDW: 15.7 % — ABNORMAL HIGH (ref 11.5–15.5)
WBC: 6.8 10*3/uL (ref 4.0–10.5)

## 2015-12-06 LAB — BASIC METABOLIC PANEL
ANION GAP: 10 (ref 5–15)
BUN: 20 mg/dL (ref 6–20)
CALCIUM: 9.1 mg/dL (ref 8.9–10.3)
CHLORIDE: 104 mmol/L (ref 101–111)
CO2: 24 mmol/L (ref 22–32)
Creatinine, Ser: 1.38 mg/dL — ABNORMAL HIGH (ref 0.61–1.24)
GFR calc non Af Amer: 53 mL/min — ABNORMAL LOW (ref >60)
Glucose, Bld: 91 mg/dL (ref 65–99)
Potassium: 3.7 mmol/L (ref 3.5–5.1)
Sodium: 138 mmol/L (ref 135–145)

## 2015-12-06 LAB — HEPATIC FUNCTION PANEL
ALBUMIN: 3.6 g/dL (ref 3.5–5.0)
ALT: 43 U/L (ref 17–63)
AST: 34 U/L (ref 15–41)
Alkaline Phosphatase: 59 U/L (ref 38–126)
BILIRUBIN TOTAL: 1 mg/dL (ref 0.3–1.2)
Bilirubin, Direct: 0.3 mg/dL (ref 0.1–0.5)
Indirect Bilirubin: 0.7 mg/dL (ref 0.3–0.9)
Total Protein: 6.9 g/dL (ref 6.5–8.1)

## 2015-12-06 LAB — COOXEMETRY PANEL
CARBOXYHEMOGLOBIN: 1.3 % (ref 0.5–1.5)
METHEMOGLOBIN: 0.7 % (ref 0.0–1.5)
O2 Saturation: 31.3 %
TOTAL HEMOGLOBIN: 12.2 g/dL (ref 12.0–16.0)

## 2015-12-06 LAB — I-STAT TROPONIN, ED: TROPONIN I, POC: 0.02 ng/mL (ref 0.00–0.08)

## 2015-12-06 LAB — BRAIN NATRIURETIC PEPTIDE: B Natriuretic Peptide: 1701.1 pg/mL — ABNORMAL HIGH (ref 0.0–100.0)

## 2015-12-06 MED ORDER — SODIUM CHLORIDE 0.9% FLUSH: 10.0000 mL | INTRAVENOUS | Status: DC | PRN

## 2015-12-06 MED ORDER — SODIUM CHLORIDE 0.9% FLUSH
3.0000 mL | INTRAVENOUS | Status: DC | PRN
  Administered 2015-12-08: 3 mL via INTRAVENOUS
  Filled 2015-12-06: qty 3

## 2015-12-06 MED ORDER — ALPRAZOLAM 0.25 MG PO TABS
0.2500 mg | ORAL_TABLET | Freq: Two times a day (BID) | ORAL | Status: DC | PRN
  Administered 2015-12-07 – 2015-12-13 (×6): 0.25 mg via ORAL
  Filled 2015-12-06 (×8): qty 1

## 2015-12-06 MED ORDER — SODIUM CHLORIDE 0.9 % IV SOLN: 250.0000 mL | INTRAVENOUS | Status: DC | PRN

## 2015-12-06 MED ORDER — ASPIRIN EC 81 MG PO TBEC
81.0000 mg | DELAYED_RELEASE_TABLET | Freq: Every day | ORAL | Status: DC
  Administered 2015-12-06 – 2015-12-14 (×9): 81 mg via ORAL
  Filled 2015-12-06 (×9): qty 1

## 2015-12-06 MED ORDER — HYDROXYZINE HCL 25 MG PO TABS
25.0000 mg | ORAL_TABLET | Freq: Every evening | ORAL | Status: DC | PRN
  Administered 2015-12-07 – 2015-12-09 (×3): 25 mg via ORAL
  Filled 2015-12-06 (×3): qty 1

## 2015-12-06 MED ORDER — LISINOPRIL 2.5 MG PO TABS
2.5000 mg | ORAL_TABLET | Freq: Every day | ORAL | Status: DC
  Administered 2015-12-07 – 2015-12-09 (×2): 2.5 mg via ORAL
  Filled 2015-12-06 (×3): qty 1

## 2015-12-06 MED ORDER — FUROSEMIDE 40 MG PO TABS
40.0000 mg | ORAL_TABLET | Freq: Every day | ORAL | Status: DC
  Administered 2015-12-07 – 2015-12-09 (×3): 40 mg via ORAL
  Filled 2015-12-06 (×3): qty 1

## 2015-12-06 MED ORDER — FUROSEMIDE 40 MG PO TABS: 40.0000 mg | ORAL_TABLET | ORAL | Status: DC

## 2015-12-06 MED ORDER — ALBUTEROL SULFATE HFA 108 (90 BASE) MCG/ACT IN AERS: 2.0000 | INHALATION_SPRAY | RESPIRATORY_TRACT | Status: DC | PRN

## 2015-12-06 MED ORDER — ONDANSETRON HCL 4 MG/2ML IJ SOLN: 4.0000 mg | Freq: Four times a day (QID) | INTRAMUSCULAR | Status: DC | PRN

## 2015-12-06 MED ORDER — LORAZEPAM 2 MG/ML IJ SOLN
0.5000 mg | Freq: Once | INTRAMUSCULAR | Status: AC
  Administered 2015-12-06: 0.5 mg via INTRAVENOUS
  Filled 2015-12-06: qty 1

## 2015-12-06 MED ORDER — ALBUTEROL SULFATE (2.5 MG/3ML) 0.083% IN NEBU: 2.5000 mg | INHALATION_SOLUTION | RESPIRATORY_TRACT | Status: DC | PRN

## 2015-12-06 MED ORDER — NITROGLYCERIN 0.4 MG SL SUBL: 0.4000 mg | SUBLINGUAL_TABLET | SUBLINGUAL | Status: DC | PRN

## 2015-12-06 MED ORDER — ATORVASTATIN CALCIUM 40 MG PO TABS
40.0000 mg | ORAL_TABLET | Freq: Every day | ORAL | Status: DC
  Administered 2015-12-07 – 2015-12-14 (×8): 40 mg via ORAL
  Filled 2015-12-06 (×8): qty 1

## 2015-12-06 MED ORDER — CLOTRIMAZOLE 1 % EX CREA: TOPICAL_CREAM | Freq: Two times a day (BID) | CUTANEOUS | Status: DC | PRN

## 2015-12-06 MED ORDER — MILRINONE LACTATE IN DEXTROSE 20-5 MG/100ML-% IV SOLN
0.1250 ug/kg/min | INTRAVENOUS | Status: DC
  Administered 2015-12-06 – 2015-12-09 (×3): 0.125 ug/kg/min via INTRAVENOUS
  Administered 2015-12-09 – 2015-12-11 (×4): 0.25 ug/kg/min via INTRAVENOUS
  Administered 2015-12-12: 0.125 ug/kg/min via INTRAVENOUS
  Filled 2015-12-06 (×9): qty 100

## 2015-12-06 MED ORDER — ZOLPIDEM TARTRATE 5 MG PO TABS
5.0000 mg | ORAL_TABLET | Freq: Every evening | ORAL | Status: DC | PRN
  Administered 2015-12-10 – 2015-12-13 (×5): 5 mg via ORAL
  Filled 2015-12-06 (×5): qty 1

## 2015-12-06 MED ORDER — ACETAMINOPHEN 325 MG PO TABS
650.0000 mg | ORAL_TABLET | ORAL | Status: DC | PRN
  Administered 2015-12-09: 650 mg via ORAL
  Filled 2015-12-06 (×3): qty 2

## 2015-12-06 MED ORDER — LORAZEPAM 1 MG PO TABS: 1.0000 mg | ORAL_TABLET | Freq: Once | ORAL | Status: DC

## 2015-12-06 MED ORDER — SODIUM CHLORIDE 0.9% FLUSH
3.0000 mL | Freq: Two times a day (BID) | INTRAVENOUS | Status: DC
  Administered 2015-12-07 – 2015-12-12 (×7): 3 mL via INTRAVENOUS

## 2015-12-06 MED ORDER — CLOPIDOGREL BISULFATE 75 MG PO TABS
75.0000 mg | ORAL_TABLET | Freq: Every day | ORAL | Status: DC
  Administered 2015-12-06 – 2015-12-14 (×9): 75 mg via ORAL
  Filled 2015-12-06 (×9): qty 1

## 2015-12-06 NOTE — ED Notes (Signed)
[name] barrett, PA called to check on admission. PA cleared pt to eat

## 2015-12-06 NOTE — H&P (Signed)
CARDIOLOGY HISTORY AND PHYSICAL   Patient ID: [name] MRN: [id number] DOB/AGE: November 28, 1955 60 y.o.  Admit date: 12/06/2015  Primary Physician   Tula Nakayama, MD Primary Cardiologist   Dr [name], Dr [name] Reason for Consultation   Arrhythmia  WUJ:WJXBJ [name] is a 60 y.o. year old male with a history of MDT ICD for ICM w/ EF 15% by echo 11/2014, S-CHF, HTN, HLD, tob use.  Pt had been doing well until a couple of days ago. He woke up on Wednesday with fatigue, DOE with any activity and sometimes at rest. No edema, orthopnea or PND. He has not had any med changes, dietary changes and has been compliant with current meds.   He has not had chest pain. The SOB did not get better over the last 2 days and activity did not change the symptoms any. He was able to mow the yard, but has been extremely tired and was SOB the whole time. He has not had chest pain.     Past Medical History:  Diagnosis Date  . Anxiety   . Automatic implantable cardioverter-defibrillator in situ    a. Medtronic ICD.  [name] CAD (coronary artery disease)    a.  Severe LAD stenosis 2/2 acute thrombus - BMS 2009;  b. 06/01/11 Cath - LAD 20 isr, 67m (3.0x16 Veri-flex BMS), OM1 100p, EF 20-25%;  c. 07/2012 Abnl Cardiolite;  d. 08/2012 Cath/PCI: LM nl, LAD patent stents, D2 80-90 (jailed), LCX 90 p/m (4.0x24 Promus Premier DES), OM1 100, OM2 80-90p (3.0x20 Promus Premier DES), RCA patent mid stent, 40d, EF 25%.  e. 10/29/15 LHC stable disease with patent stents  . Chronic systolic CHF (congestive heart failure) (HCC)   . Chronic systolic CHF (congestive heart failure), NYHA class 3 (HCC) - low output state 12/12/2014  . Chronic systolic congestive heart failure (HCC) - low output state 12/12/2014  . Depression   . History of rheumatic fever 1962  . HLD (hyperlipidemia) mixed  . HTN (hypertension)   . Ischemic cardiomyopathy    a.  EF 30-35% 2010;  b.  EF 20-25% by LV gram 08/2012. c. EF 15% by echo 11/2014.  [name]  Myocardial infarction 1998; 2002; ~ 2010  . Paroxysmal ventricular tachycardia (HCC)    a. VFlutter  CL 210 msec  Rx shock 04/2011  . PVC's (premature ventricular contractions)   . RBBB   . TIA (transient ischemic attack)    a. Dx 11/2014, continued on ASA/Plavix.     Past Surgical History:  Procedure Laterality Date  . CARDIAC CATHETERIZATION     "lots of those" (08/29/2012)  . CARDIAC CATHETERIZATION N/A 10/29/2015   Procedure: Right/Left Heart Cath and Coronary Angiography;  Surgeon: [name] M Swaziland, MD;  Location: [facility] INVASIVE CV LAB;  Service: Cardiovascular;  Laterality: N/A;  . CARDIAC DEFIBRILLATOR PLACEMENT     primary prevention for sudden death; Dr. Graciela [name]   . CORONARY ANGIOPLASTY WITH STENT PLACEMENT  2002-08/29/2012   "think I had 4 total then 2 today" to LAD 06/01/2011/notes (08/29/2012)  . defibrillator implantation  04/12/02   primary prevention for sudden death; Dr. Graciela [name]   . HERNIA REPAIR     "w/gallbladder OR" (08/29/2012)  . LAPAROSCOPIC CHOLECYSTECTOMY    . LEFT HEART CATHETERIZATION WITH CORONARY ANGIOGRAM N/A 06/08/2011   Procedure: LEFT HEART CATHETERIZATION WITH CORONARY ANGIOGRAM;  Surgeon: [name] Patty, MD;  Location: [facility] CATH LAB;  Service: Cardiovascular;  Laterality: N/A;  . PERCUTANEOUS CORONARY STENT INTERVENTION (  PCI-S) N/A 06/01/2011   Procedure: PERCUTANEOUS CORONARY STENT INTERVENTION (PCI-S);  Surgeon: [name] M SwazilandJordan, MD;  [facility];  Service: Cardiovascular;  Laterality: N/A;  . PERCUTANEOUS CORONARY STENT INTERVENTION (PCI-S) N/A 08/29/2012   Procedure: PERCUTANEOUS CORONARY STENT INTERVENTION (PCI-S);  Surgeon: [name] M SwazilandJordan, MD;  [facility];  Service: Cardiovascular;  Laterality: N/A;    No Known Allergies  I have reviewed the patient's current medications   . milrinone      Medication Sig  albuterol (PROAIR HFA) 108 (90 Base) MCG/ACT inhaler Inhale 2 puffs into the lungs every 4 (four) hours as needed for wheezing or shortness  of breath.  ALPRAZolam (XANAX) 0.25 MG tablet Take 1 tablet (0.25 mg total) by mouth 3 (three) times daily as needed for anxiety. Patient taking differently: Take 0.25 mg by mouth daily as needed for anxiety.   aspirin EC 81 MG tablet Take 81 mg by mouth daily.  atorvastatin (LIPITOR) 40 MG tablet Take 1 tablet (40 mg total) by mouth daily at 6 PM. Patient taking differently: Take 40 mg by mouth daily.   clopidogrel (PLAVIX) 75 MG tablet Take 75 mg by mouth daily.   clotrimazole-betamethasone (LOTRISONE) cream Apply 1 application topically 2 (two) times daily. Patient taking differently: Apply 1 application topically 2 (two) times daily as needed (eczema).   furosemide (LASIX) 40 MG tablet Take 40 mg by mouth See admin instructions. Take 1 tablet (40 mg) by mouth daily, may also take an additional tablet as needed for fluid buildup  hydrOXYzine (VISTARIL) 25 MG capsule Take 25 mg by mouth at bedtime as needed (sleep).  lisinopril (PRINIVIL,ZESTRIL) 2.5 MG tablet Take 2.5 mg by mouth daily.  nitroGLYCERIN (NITROSTAT) 0.4 MG SL tablet Place 1 tablet (0.4 mg total) under the tongue every 5 (five) minutes as needed. For chest pain Patient taking differently: Place 0.4 mg under the tongue every 5 (five) minutes as needed for chest pain.      Social History   Social History  . Marital status: Married    Spouse name: N/A  . Number of children: N/A  . Years of education: N/A   Occupational History  . Not on file.   Social History Main Topics  . Smoking status: Current Every Day Smoker    Packs/day: 0.50    Years: 44.00    Types: Cigarettes    Last attempt to quit: 05/27/2011  . Smokeless tobacco: Never Used  . Alcohol use No     Comment: 08/29/2012 "6-8 beers once/wk". Has not had alcohol in the last 6-8 months  . Drug use: No  . Sexual activity: Not Currently   Other Topics Concern  . Not on file   Social History Narrative   Married; full time.     Family Status  Relation Status    . Mother Alive  . Father Deceased  . Sister Alive  . Brother Alive  .    [name]. Neg Hx    Family History  Problem Relation Age of Onset  . Cancer Father   . Cancer Sister     twin sister and only one has cancer, unknown what kind  . Hypertension Brother   . Cancer      family hx  . Heart attack Neg Hx   . Stroke Neg Hx      ROS:  Full 14 point review of systems complete and found to be negative unless listed above.  Physical Exam: Blood pressure 103/66, pulse [name](!)  44, temperature 98.1 F (36.7 C), temperature source Oral, resp. rate 15, SpO2 (!) 82 %.  General: Well developed, well nourished, male in no acute distress Head: Eyes PERRLA, No xanthomas.   Normocephalic and atraumatic, oropharynx without edema or exudate. Dentition: poor Lungs: decreased BS bases, very few rales [name] Heart: HRRR S1 S2, no rub/gallop, 2/6 murmur. pulses are 2+ all 4 extrem.   Neck: No carotid bruits. No lymphadenopathy.  JVD not elevated Abdomen: Bowel sounds present, abdomen soft and non-tender without masses or hernias noted. Msk:  No spine or cva tenderness. No weakness, no joint deformities or effusions. Extremities: No clubbing or cyanosis. No edema.  Neuro: Alert and oriented X 3. No focal deficits noted. Psych:  Good affect, responds appropriately Skin: No rashes or lesions noted.  Labs:   Lab Results  Component Value Date   WBC 6.8 12/06/2015   HGB 13.0 12/06/2015   HCT 40.6 12/06/2015   MCV 93.8 12/06/2015   PLT 223 12/06/2015     Recent Labs Lab 12/06/15 1537 12/06/15 1539  NA  --  138  K  --  3.7  CL  --  104  CO2  --  24  BUN  --  20  CREATININE  --  1.38*  CALCIUM  --  9.1  PROT 6.9  --   BILITOT 1.0  --   ALKPHOS 59  --   ALT 43  --   AST 34  --   GLUCOSE  --  91  ALBUMIN 3.6  --     Recent Labs  12/06/15 1552  TROPIPOC 0.02   B Natriuretic Peptide  Date/Time Value Ref Range Status  12/06/2015 03:37 PM 1,701.1 (H) 0.0 - 100.0 pg/mL Final  06/19/2015 03:26  PM 1,149.4 (H) 0.0 - 100.0 pg/mL Final   Echo: 11/2014 - Left ventricle: Systolic function is severely reduced, estimated   EF 15%. The cavity size was moderately dilated. Features are   consistent with a pseudonormal left ventricular filling pattern,   with concomitant abnormal relaxation and increased filling   pressure (grade 2 diastolic dysfunction). Doppler parameters are   consistent with high ventricular filling pressure. - Regional wall motion abnormality: Akinesis of the mid-apical   anterior, apical inferior, mid anterolateral, apical lateral, and   apical myocardium; severe hypokinesis of the mid anteroseptal,   mid inferior, and apical septal myocardium; moderate hypokinesis   of the basal anteroseptal, basal inferior, and basal-mid   inferolateral myocardium. - Aortic valve: Mildly calcified annulus. Trileaflet. There was   mild to moderate regurgitation. - Mitral valve: There was moderate regurgitation. - Left atrium: The atrium was severely dilated. Volume/bsa, ES,   (1-plane Simpson&'s, A2C): 48.1 ml/m^2. - Right ventricle: Pacer wire or catheter noted in right ventricle.   Systolic function was mildly reduced. - Right atrium: The atrium was mildly dilated.  ECG:  10/13 HR 82, SR w/ 2:1 ectopy Telemetry w/ Mobitz I & II heart block, HR 40s  Cath: 10/29/2015 Diagnostic Diagram        Radiology:  Dg Chest 2 View Result Date: 12/06/2015 CLINICAL DATA:  Chest pressure, shortness breath, low heart rate. EXAM: CHEST  2 VIEW COMPARISON:  10/25/2015 FINDINGS: Cardiomegaly. Left AICD remains in place, unchanged. Biapical scarring. Lungs otherwise clear. No effusions, edema or acute bony abnormality. IMPRESSION: Cardiomegaly.  No active disease. Electronically Signed   By: Charlett Nose M.D.   On: 12/06/2015 16:32    ASSESSMENT AND PLAN:   The patient was  seen today by Dr Antoine Poche, the patient evaluated and the data reviewed.  Principal Problem:   Chronic systolic CHF  (congestive heart failure), NYHA class 3 (HCC) - low output state - per pt, wt has been stable - until a couple of days ago, was doing ok - Seen by SK 09/10 and Holter planned to determine the correlation between symptoms and heart rate. - pt began feeling worse and came in today. - no S&S of volume overload, continue current Lasix dose - he is likely low output CHF, may benefit from milrinone, will add.   Active Problems:   Ischemic cardiomyopathy - no ischemic sx despite low HR - continue ASA, statin, ACE, no BB 2nd low HR    HLD (hyperlipidemia) - on statin, no recent profile. - continue statin, ck profile in am    Tobacco use - cessation encouraged  Signed: Leanna Battles 12/06/2015 7:14 PM Beeper [phone]  Co-Sign MD  History and all data above reviewed.  Patient examined.  I agree with the findings as above. The patient presesnts with progressive fatigue and SOB above his baseline.  He came into the ED today because he felt worse than usual.  He has had no weight gain and no edema.  He feels his heart skipping.  However, he does not have chest pain.  He has no PND or orthopnea.  He has a decreased appetite with early satiety.   The patient exam reveals [id number] rhythm  ,  Lungs: Clear  ,  Abd: mildly distended, Ext No edema  .  All available labs, radiology testing, previous records reviewed. Agree with documented assessment and plan. ACUTE ON CHRONIC SYSTOLIC HF:  The patient presents with predominantly low output heart failure symptoms.  We will admit and begin low dose milrinone.  Need to discuss with EP possible upgrade to CRT.   Fayrene Fearing Martino Tompson  8:52 PM  12/06/2015

## 2015-12-06 NOTE — Progress Notes (Signed)
Peripherally Inserted Central Catheter/Midline Placement  The IV Nurse has discussed with the patient and/or persons authorized to consent for the patient, the purpose of this procedure and the potential benefits and risks involved with this procedure.  The benefits include less needle sticks, lab draws from the catheter, and the patient may be discharged home with the catheter. Risks include, but not limited to, infection, bleeding, blood clot (thrombus formation), and puncture of an artery; nerve damage and irregular heartbeat and possibility to perform a PICC exchange if needed/ordered by physician.  Alternatives to this procedure were also discussed.  Bard Power PICC patient education guide, fact sheet on infection prevention and patient information card has been provided to patient /or left at bedside.    PICC/Midline Placement Documentation  PICC Double Lumen 12/06/15 PICC Right Brachial 39 cm 0 cm (Active)  Indication for Insertion or Continuance of Line Limited venous access - need for IV therapy >5 days (PICC only) 12/06/2015  9:50 PM  Exposed Catheter (cm) 0 cm 12/06/2015  9:50 PM  Site Assessment Clean;Dry;Intact 12/06/2015  9:50 PM  Lumen #1 Status Blood return noted;Flushed;Saline locked 12/06/2015  9:50 PM  Lumen #2 Status Blood return noted;Flushed;Saline locked 12/06/2015  9:50 PM  Dressing Type Transparent 12/06/2015  9:50 PM  Dressing Status Clean;Dry;Intact;Antimicrobial disc in place 12/06/2015  9:50 PM  Dressing Intervention New dressing 12/06/2015  9:50 PM  Dressing Change Due 12/13/15 12/06/2015  9:50 PM       [name], [name] 12/06/2015, 10:11 PM

## 2015-12-06 NOTE — ED Notes (Signed)
[facility]

## 2015-12-06 NOTE — Telephone Encounter (Signed)
Patient was transferred directly to triage.  When call came in pt sounded extremely short of breath before speaking, SOB worsened when talking. He c/o CP weakness, and "not feeling good/right". He ask if he should send a transmission. I advised him to hang up and call 911 immediately. He states he would like to just send transmission. I advised him to call 911 immediatly, then if he wanted to send transmission go ahead.  He agreed with plan and hung up.

## 2015-12-06 NOTE — Telephone Encounter (Signed)
Pt c/o Shortness Of Breath: STAT if SOB developed within the last 24 hours or pt is noticeably SOB on the phone  1. Are you currently SOB (can you hear that pt is SOB on the phone)?  YES  2. How long have you been experiencing SOB? 2days  3. Are you SOB when sitting or when up moving around? All the time  4. Are you currently experiencing any other symptoms? YES

## 2015-12-06 NOTE — ED Provider Notes (Signed)
Emergency Department Provider Note   I have reviewed the triage vital signs and the nursing notes.   HISTORY  Chief Complaint Chest Pain   HPI [name] is a 60 y.o. male with PMH of anxiety, CAD, CHF (EF15%), HLD, HTN, and paroxysmal v-tach presents to the emergency department for evaluation of dyspnea over the past 1-2 days. Patient notes an associated mild chest pressure and mild palpitation sensation. States he's followed closely by cardiologist and electrophysiologist with a Medtronic device implanted. There is a plan to switch the device to a dual chamber. Patient states that his breathing seems to be progressively worsening but he does not feel that he is fluid overloaded. He continues to take his Lasix. He has good urine output. No fever or chills. He states that he is feeling more and more anxious special over the last 2 days and is requesting that we give him something for this. He reports his breathing is worse when lying flat but not particularly relieved with sitting up. His carvedilol was discontinued 1 week prior but this is the only medication change.    Past Medical History:  Diagnosis Date  . Anxiety   . Automatic implantable cardioverter-defibrillator in situ    a. Medtronic ICD.  [name] CAD (coronary artery disease)    a.  Severe LAD stenosis 2/2 acute thrombus - BMS 2009;  b. 06/01/11 Cath - LAD 20 isr, 54m (3.0x16 Veri-flex BMS), OM1 100p, EF 20-25%;  c. 07/2012 Abnl Cardiolite;  d. 08/2012 Cath/PCI: LM nl, LAD patent stents, D2 80-90 (jailed), LCX 90 p/m (4.0x24 Promus Premier DES), OM1 100, OM2 80-90p (3.0x20 Promus Premier DES), RCA patent mid stent, 40d, EF 25%.  e. 10/29/15 LHC stable disease with patent stents  . Chronic systolic CHF (congestive heart failure) (HCC)   . Chronic systolic CHF (congestive heart failure), NYHA class 3 (HCC) - low output state 12/12/2014  . Chronic systolic congestive heart failure (HCC) - low output state 12/12/2014  . Depression   .  History of rheumatic fever 1962  . HLD (hyperlipidemia) mixed  . HTN (hypertension)   . Ischemic cardiomyopathy    a.  EF 30-35% 2010;  b.  EF 20-25% by LV gram 08/2012. c. EF 15% by echo 11/2014.  [name] Myocardial infarction 1998; 2002; ~ 2010  . Paroxysmal ventricular tachycardia (HCC)    a. VFlutter  CL 210 msec  Rx shock 04/2011  . PVC's (premature ventricular contractions)   . RBBB   . TIA (transient ischemic attack)    a. Dx 11/2014, continued on ASA/Plavix.    Patient Active Problem List   Diagnosis Date Noted  . DOE (dyspnea on exertion) 06/19/2015  . Acute systolic congestive heart failure, NYHA class 3 (HCC)   . Status post implantation of automatic cardioverter/defibrillator (AICD) - MDT   . H/O medication noncompliance   . Acute kidney insufficiency   . Chest pain at rest   . Depression 04/09/2015  . Chronic systolic CHF (congestive heart failure), NYHA class 2 (HCC) 04/09/2015  . Erectile dysfunction 04/09/2015  . RBBB   . PVC's (premature ventricular contractions)   . Chronic systolic CHF (congestive heart failure), NYHA class 3 (HCC) - low output state 12/12/2014  . Generalized anxiety disorder 12/09/2014  . Major depressive disorder, recurrent episode (HCC) 12/09/2014  . TIA (transient ischemic attack)   . HLD (hyperlipidemia)   . Aphasia 05/04/2014  . Unstable angina (HCC) 06/02/2011  . CAD (coronary artery disease)   .  Ischemic cardiomyopathy   . Automatic implantable cardioverter-defibrillator in situ   . TOBACCO ABUSE 02/04/2010  . VENTRICULAR TACHYCARDIA 08/21/2008  . HYPERLIPIDEMIA-MIXED 06/04/2008  . ANXIETY 06/04/2008  . Essential hypertension 06/04/2008  . Cardiomyopathy, ischemic 06/04/2008  . CHEST PAIN-UNSPECIFIED 06/04/2008    Past Surgical History:  Procedure Laterality Date  . CARDIAC CATHETERIZATION     "lots of those" (08/29/2012)  . CARDIAC CATHETERIZATION N/A 10/29/2015   Procedure: Right/Left Heart Cath and Coronary Angiography;  Surgeon:  Peter M SwazilandJordan, MD;  [facility];  Service: Cardiovascular;  Laterality: N/A;  . CARDIAC DEFIBRILLATOR PLACEMENT     primary prevention for sudden death; Dr. Graciela HusbandsKlein   . CORONARY ANGIOPLASTY WITH STENT PLACEMENT  2002-08/29/2012   "think I had 4 total then 2 today" to LAD 06/01/2011/notes (08/29/2012)  . defibrillator implantation  04/12/02   primary prevention for sudden death; Dr. Graciela HusbandsKlein   . HERNIA REPAIR     "w/gallbladder OR" (08/29/2012)  . LAPAROSCOPIC CHOLECYSTECTOMY    . LEFT HEART CATHETERIZATION WITH CORONARY ANGIOGRAM N/A 06/08/2011   Procedure: LEFT HEART CATHETERIZATION WITH CORONARY ANGIOGRAM;  Surgeon: Dolores Pattyaniel R Bensimhon, MD;  [facility];  Service: Cardiovascular;  Laterality: N/A;  . PERCUTANEOUS CORONARY STENT INTERVENTION (PCI-S) N/A 06/01/2011   Procedure: PERCUTANEOUS CORONARY STENT INTERVENTION (PCI-S);  Surgeon: Peter M SwazilandJordan, MD;  [facility];  Service: Cardiovascular;  Laterality: N/A;  . PERCUTANEOUS CORONARY STENT INTERVENTION (PCI-S) N/A 08/29/2012   Procedure: PERCUTANEOUS CORONARY STENT INTERVENTION (PCI-S);  Surgeon: Peter M SwazilandJordan, MD;  [facility];  Service: Cardiovascular;  Laterality: N/A;      Allergies Review of patient's allergies indicates no known allergies.  Family History  Problem Relation Age of Onset  . Cancer Father   . Cancer Sister     twin sister and only one has cancer, unknown what kind  . Hypertension Brother   . Cancer      family hx  . Heart attack Neg Hx   . Stroke Neg Hx     Social History Social History  Substance Use Topics  . Smoking status: Current Every Day Smoker    Packs/day: 0.50    Years: 44.00    Types: Cigarettes    Last attempt to quit: 05/27/2011  . Smokeless tobacco: Never Used  . Alcohol use No     Comment: 08/29/2012 "6-8 beers once/wk". Has not had alcohol in the last 6-8 months    Review of Systems  Constitutional: No fever/chills Eyes: No visual changes. ENT: No  sore throat. Cardiovascular: Positive chest pressure.  Respiratory: Positive shortness of breath. Gastrointestinal: No abdominal pain.  No nausea, no vomiting.  No diarrhea.  No constipation. Genitourinary: Negative for dysuria. Musculoskeletal: Negative for back pain. Skin: Negative for rash. Neurological: Negative for headaches, focal weakness or numbness.  10-point ROS otherwise negative.  ____________________________________________   PHYSICAL EXAM:  VITAL SIGNS: ED Triage Vitals  Enc Vitals Group     BP 12/06/15 1533 124/57     Pulse Rate 12/06/15 1533 73     Resp 12/06/15 1533 18     Temp 12/06/15 1533 98.1 F (36.7 C)     Temp Source 12/06/15 1533 Oral     SpO2 12/06/15 1533 100 %     Pain Score 12/06/15 1530 1   Constitutional: Alert and oriented. Well appearing and in no acute distress. Eyes: Conjunctivae are normal. Head: Atraumatic. Nose: No congestion/rhinnorhea. Mouth/Throat: Mucous membranes are moist.  Oropharynx  non-erythematous. Neck: No stridor.  Cardiovascular: Bradycardia. Good peripheral circulation. Grossly normal heart sounds.   Respiratory: Normal respiratory effort.  No retractions. Lungs CTAB. No wheezing or rales.  Gastrointestinal: Soft and nontender. No distention.  Musculoskeletal: No lower extremity tenderness nor edema. No gross deformities of extremities. Neurologic:  Normal speech and language. No gross focal neurologic deficits are appreciated.  Skin:  Skin is warm, dry and intact. No rash noted. Psychiatric: Mood and affect are normal. Speech and behavior are normal.  ____________________________________________   LABS (all labs ordered are listed, but only abnormal results are displayed)  Labs Reviewed  BASIC METABOLIC PANEL - Abnormal; Notable for the following:       Result Value   Creatinine, Ser 1.38 (*)    GFR calc non Af Amer 53 (*)    All other components within normal limits  CBC - Abnormal; Notable for the following:      RDW 15.7 (*)    All other components within normal limits  BRAIN NATRIURETIC PEPTIDE - Abnormal; Notable for the following:    B Natriuretic Peptide 1,701.1 (*)    All other components within normal limits  COOXEMETRY PANEL - Abnormal; Notable for the following:    Total hemoglobin 11.2 (*)    All other components within normal limits  COMPREHENSIVE METABOLIC PANEL - Abnormal; Notable for the following:    Potassium 3.4 (*)    Glucose, Bld 110 (*)    BUN 23 (*)    Creatinine, Ser 1.41 (*)    Calcium 8.7 (*)    Total Protein 5.8 (*)    Albumin 3.0 (*)    GFR calc non Af Amer 52 (*)    All other components within normal limits  LIPID PANEL - Abnormal; Notable for the following:    HDL 25 (*)    All other components within normal limits  GLUCOSE, CAPILLARY - Abnormal; Notable for the following:    Glucose-Capillary 105 (*)    All other components within normal limits  GLUCOSE, CAPILLARY - Abnormal; Notable for the following:    Glucose-Capillary 101 (*)    All other components within normal limits  MRSA PCR SCREENING  HEPATIC FUNCTION PANEL  COOXEMETRY PANEL  I-STAT TROPOININ, ED   ____________________________________________  EKG   EKG Interpretation  Date/Time:  Friday December 06 2015 15:32:05 EDT Ventricular Rate:  82 PR Interval:    QRS Duration: 165 QT Interval:  472 QTC Calculation: 448 R Axis:   -84 Text Interpretation:  Sinus rhythm Supraventricular bigeminy Prolonged PR interval Probable left atrial enlargement Right bundle branch block Probable inferior infarct, acute Lateral leads are also involved No STEMI. Bigemony new from prior tracing.  Confirmed by Varun Jourdan MD, Hala Narula [phone]) on 12/06/2015 3:55:16 PM      ____________________________________________  RADIOLOGY  Dg Chest 2 View  Result Date: 12/06/2015 CLINICAL DATA:  Chest pressure, shortness breath, low heart rate. EXAM: CHEST  2 VIEW COMPARISON:  10/25/2015 FINDINGS: Cardiomegaly. Left AICD  remains in place, unchanged. Biapical scarring. Lungs otherwise clear. No effusions, edema or acute bony abnormality. IMPRESSION: Cardiomegaly.  No active disease. Electronically Signed   By: Charlett Nose M.D.   On: 12/06/2015 16:32    ____________________________________________   PROCEDURES  Procedure(s) performed:   Procedures  None ____________________________________________   INITIAL IMPRESSION / ASSESSMENT AND PLAN / ED COURSE  Pertinent labs & imaging results that were available during my care of the patient were reviewed by me and considered in my medical decision  making (see chart for details).  Patient resents to the emergency department for evaluation of dyspnea, chest pressure, mild palpitations. He has a significant heart history including coronary artery disease, congestive heart failure, occipital V. Tach he has a Medtronic device. Patient is biliary well appearing. His EKG is significant for a bigeminy pattern. Blood pressure is normal. Mental status normal. Land for labs and chest x-ray. The patient is requesting some Ativan for anxiety. I do treated with very small dose and reassess. We will interrogate his device and contact cardiology.  05:05 PM Spoke with cardiology who will be down to see the patient in the emergency department. No obvious electrolyte abnormality. Chest x-ray unremarkable. BNP elevated but no other clinical signs to suggest volume overload. Updated patient and will reassess frequently.   Cardiology to admit. Reviewed all EKGs, labs, and associated notes.  ____________________________________________  FINAL CLINICAL IMPRESSION(S) / ED DIAGNOSES  Final diagnoses:  SOB (shortness of breath)     MEDICATIONS GIVEN DURING THIS VISIT:  Medications  milrinone (PRIMACOR) 20 MG/100 ML (0.2 mg/mL) infusion (0.125 mcg/kg/min  90.5 kg Intravenous New Bag/Given 12/06/15 2005)  nitroGLYCERIN (NITROSTAT) SL tablet 0.4 mg (not administered)    acetaminophen (TYLENOL) tablet 650 mg (not administered)  ondansetron (ZOFRAN) injection 4 mg (not administered)  ALPRAZolam (XANAX) tablet 0.25 mg (not administered)  sodium chloride flush (NS) 0.9 % injection 3 mL (3 mLs Intravenous Given 12/07/15 0935)  sodium chloride flush (NS) 0.9 % injection 3 mL (not administered)  0.9 %  sodium chloride infusion (not administered)  zolpidem (AMBIEN) tablet 5 mg (not administered)  aspirin EC tablet 81 mg (81 mg Oral Given 12/07/15 0931)  atorvastatin (LIPITOR) tablet 40 mg (40 mg Oral Given 12/07/15 0932)  clopidogrel (PLAVIX) tablet 75 mg (75 mg Oral Given 12/07/15 0933)  clotrimazole (LOTRIMIN) 1 % cream (not administered)  hydrOXYzine (ATARAX/VISTARIL) tablet 25 mg (not administered)  lisinopril (PRINIVIL,ZESTRIL) tablet 2.5 mg (2.5 mg Oral Given 12/07/15 0931)  sodium chloride flush (NS) 0.9 % injection 10-40 mL (not administered)  albuterol (PROVENTIL) (2.5 MG/3ML) 0.083% nebulizer solution 2.5 mg (not administered)  furosemide (LASIX) tablet 40 mg (40 mg Oral Given 12/07/15 0933)  LORazepam (ATIVAN) tablet 1 mg (not administered)  LORazepam (ATIVAN) injection 0.5 mg (0.5 mg Intravenous Given 12/06/15 1745)     NEW OUTPATIENT MEDICATIONS STARTED DURING THIS VISIT:  None   Note:  This document was prepared using Dragon voice recognition software and may include unintentional dictation errors.  Alona Bene, MD Emergency Medicine  Maia Plan, MD 12/07/15 1140

## 2015-12-06 NOTE — ED Notes (Signed)
Attempted to call report

## 2015-12-06 NOTE — ED Triage Notes (Signed)
Patient complains of chest pressure.  On EMS arrival patient heart rate in 30's and 40's. Patient alert and oriented.  Rates pain/pressure at 1/10.

## 2015-12-07 DIAGNOSIS — E876 Hypokalemia: Secondary | ICD-10-CM

## 2015-12-07 DIAGNOSIS — E7800 Pure hypercholesterolemia, unspecified: Secondary | ICD-10-CM

## 2015-12-07 DIAGNOSIS — I519 Heart disease, unspecified: Secondary | ICD-10-CM

## 2015-12-07 DIAGNOSIS — I441 Atrioventricular block, second degree: Secondary | ICD-10-CM

## 2015-12-07 DIAGNOSIS — I5023 Acute on chronic systolic (congestive) heart failure: Secondary | ICD-10-CM

## 2015-12-07 DIAGNOSIS — I25118 Atherosclerotic heart disease of native coronary artery with other forms of angina pectoris: Secondary | ICD-10-CM

## 2015-12-07 LAB — COMPREHENSIVE METABOLIC PANEL
ALBUMIN: 3 g/dL — AB (ref 3.5–5.0)
ALT: 35 U/L (ref 17–63)
AST: 26 U/L (ref 15–41)
Alkaline Phosphatase: 53 U/L (ref 38–126)
Anion gap: 7 (ref 5–15)
BUN: 23 mg/dL — AB (ref 6–20)
CHLORIDE: 104 mmol/L (ref 101–111)
CO2: 27 mmol/L (ref 22–32)
Calcium: 8.7 mg/dL — ABNORMAL LOW (ref 8.9–10.3)
Creatinine, Ser: 1.41 mg/dL — ABNORMAL HIGH (ref 0.61–1.24)
GFR calc Af Amer: >60 mL/min (ref >60)
GFR calc non Af Amer: 52 mL/min — ABNORMAL LOW (ref >60)
GLUCOSE: 110 mg/dL — AB (ref 65–99)
POTASSIUM: 3.4 mmol/L — AB (ref 3.5–5.1)
SODIUM: 138 mmol/L (ref 135–145)
Total Bilirubin: 1.1 mg/dL (ref 0.3–1.2)
Total Protein: 5.8 g/dL — ABNORMAL LOW (ref 6.5–8.1)

## 2015-12-07 LAB — COOXEMETRY PANEL
CARBOXYHEMOGLOBIN: 1.4 % (ref 0.5–1.5)
METHEMOGLOBIN: 0.9 % (ref 0.0–1.5)
O2 SAT: 75.6 %
Total hemoglobin: 11.2 g/dL — ABNORMAL LOW (ref 12.0–16.0)

## 2015-12-07 LAB — GLUCOSE, CAPILLARY
GLUCOSE-CAPILLARY: 105 mg/dL — AB (ref 65–99)
Glucose-Capillary: 101 mg/dL — ABNORMAL HIGH (ref 65–99)
Glucose-Capillary: 93 mg/dL (ref 65–99)
Glucose-Capillary: 91 mg/dL (ref 65–99)

## 2015-12-07 LAB — LIPID PANEL
CHOL/HDL RATIO: 4.9 ratio
Cholesterol: 122 mg/dL (ref 0–200)
HDL: 25 mg/dL — AB (ref >40)
LDL CALC: 82 mg/dL (ref 0–99)
TRIGLYCERIDES: 73 mg/dL (ref <150)
VLDL: 15 mg/dL (ref 0–40)

## 2015-12-07 LAB — MRSA PCR SCREENING: MRSA by PCR: NEGATIVE

## 2015-12-07 MED ORDER — FUROSEMIDE 10 MG/ML IJ SOLN
40.0000 mg | Freq: Once | INTRAMUSCULAR | Status: AC
Start: 2015-12-07 — End: 2015-12-07
  Administered 2015-12-07: 40 mg via INTRAVENOUS
  Filled 2015-12-07: qty 4

## 2015-12-07 MED ORDER — POTASSIUM CHLORIDE CRYS ER 20 MEQ PO TBCR
40.0000 meq | EXTENDED_RELEASE_TABLET | Freq: Once | ORAL | Status: AC
  Administered 2015-12-07: 40 meq via ORAL
  Filled 2015-12-07: qty 2

## 2015-12-07 NOTE — Progress Notes (Signed)
Patient Name: [name] R Lukes Date of Encounter: 12/07/2015  [facility]  [facility] Problem List     Principal Problem:   Chronic systolic CHF (congestive heart failure), NYHA class 3 (HCC) - low output state Active Problems:   Ischemic cardiomyopathy   HLD (hyperlipidemia)     Subjective   Feels better on milrinone. Does not notice any increase in urine output. Denies chest pain.  Inpatient Medications    Scheduled Meds: . aspirin EC  81 mg Oral Daily  . atorvastatin  40 mg Oral Daily  . clopidogrel  75 mg Oral Daily  . furosemide  40 mg Oral Daily  . lisinopril  2.5 mg Oral Daily  . LORazepam  1 mg Oral Once  . potassium chloride  40 mEq Oral Once  . sodium chloride flush  3 mL Intravenous Q12H   Continuous Infusions: . milrinone 0.125 mcg/kg/min (12/06/15 2005)   PRN Meds: sodium chloride, acetaminophen, albuterol, ALPRAZolam, clotrimazole, hydrOXYzine, nitroGLYCERIN, ondansetron (ZOFRAN) IV, sodium chloride flush, sodium chloride flush, zolpidem   Vital Signs    Vitals:   12/07/15 0519 12/07/15 0800 12/07/15 0802 12/07/15 0931  BP: 110/67 111/63  118/66  Pulse: 77 83 80   Resp: 17 20 19    Temp: 98 F (36.7 C)     TempSrc: Oral     SpO2: 97% 97% 97%   Weight:      Height:        Intake/Output Summary (Last 24 hours) at 12/07/15 1218 Last data filed at 12/07/15 0937  Gross per 24 hour  Intake           517.12 ml  Output              750 ml  Net          -232.88 ml   Filed Weights   12/06/15 2319 12/07/15 0415  Weight: 199 lb 6.4 oz (90.4 kg) 199 lb 12.8 oz (90.6 kg)    Physical Exam    GEN: Well nourished, well developed, in no acute distress.  HEENT: Grossly normal.  Neck: Supple, +JVD Cardiac: RRR, 2/6 apical systolic murmur, no rubs or gallops. No clubbing, cyanosis, edema.  Radials/DP/PT  Respiratory:  Diminished, no rales. GI: Soft, nontender, nondistended, BS + x 4. MS: no deformity or atrophy. Skin: warm and dry, no  rash. Neuro:  Strength and sensation are intact. Psych: AAOx3.  Normal affect.  Labs    CBC  Recent Labs  12/06/15 1539  WBC 6.8  HGB 13.0  HCT 40.6  MCV 93.8  PLT 223   Basic Metabolic Panel  Recent Labs  12/06/15 1539 12/07/15 0438  NA 138 138  K 3.7 3.4*  CL 104 104  CO2 24 27  GLUCOSE 91 110*  BUN 20 23*  CREATININE 1.38* 1.41*  CALCIUM 9.1 8.7*   Liver Function Tests  Recent Labs  12/06/15 1537 12/07/15 0438  AST 34 26  ALT 43 35  ALKPHOS 59 53  BILITOT 1.0 1.1  PROT 6.9 5.8*  ALBUMIN 3.6 3.0*   No results for input(s): LIPASE, AMYLASE in the last 72 hours. Cardiac Enzymes No results for input(s): CKTOTAL, CKMB, CKMBINDEX, TROPONINI in the last 72 hours. BNP Invalid input(s): POCBNP D-Dimer No results for input(s): DDIMER in the last 72 hours. Hemoglobin A1C No results for input(s): HGBA1C in the last 72 hours. Fasting Lipid Panel  Recent Labs  12/07/15 0438  CHOL 122  HDL 25*  LDLCALC 82  TRIG 73  CHOLHDL 4.9   Thyroid Function Tests No results for input(s): TSH, T4TOTAL, T3FREE, THYROIDAB in the last 72 hours.  Invalid input(s): FREET3  Telemetry    Sinus rhythm with PVC's.  ECG    Personally Reviewed  Radiology    Dg Chest 2 View  Result Date: 12/06/2015 CLINICAL DATA:  Chest pressure, shortness breath, low heart rate. EXAM: CHEST  2 VIEW COMPARISON:  10/25/2015 FINDINGS: Cardiomegaly. Left AICD remains in place, unchanged. Biapical scarring. Lungs otherwise clear. No effusions, edema or acute bony abnormality. IMPRESSION: Cardiomegaly.  No active disease. Electronically Signed   By: Charlett Nose M.D.   On: 12/06/2015 16:32    Cardiac Studies   None  Patient Profile       Assessment & Plan    1. Acute on chronic systolic HF, EF 15%: Has end stage heart failure, now on IV milrinone. Has not had much urinary output. I will give one dose of IV Lasix 40 mg. Will replete potassium.  2. CAD: No ischemic symptoms.  Continue ASA, statin, Plavix, ACE, no BB 2nd low HR.  3. Hyperlipidemia: Continue statin.  4. Mobitz I and II heart block: Being followed by EP to determine symptom correlation.  Signed, Prentice Docker, MD  12/07/2015, 12:18 PM

## 2015-12-08 DIAGNOSIS — I25708 Atherosclerosis of coronary artery bypass graft(s), unspecified, with other forms of angina pectoris: Secondary | ICD-10-CM

## 2015-12-08 LAB — BASIC METABOLIC PANEL
Anion gap: 9 (ref 5–15)
BUN: 22 mg/dL — ABNORMAL HIGH (ref 6–20)
CO2: 27 mmol/L (ref 22–32)
Calcium: 8.7 mg/dL — ABNORMAL LOW (ref 8.9–10.3)
Chloride: 103 mmol/L (ref 101–111)
Creatinine, Ser: 1.26 mg/dL — ABNORMAL HIGH (ref 0.61–1.24)
GFR calc Af Amer: >60 mL/min (ref >60)
GFR calc non Af Amer: 59 mL/min — ABNORMAL LOW (ref >60)
Glucose, Bld: 122 mg/dL — ABNORMAL HIGH (ref 65–99)
Potassium: 3.6 mmol/L (ref 3.5–5.1)
Sodium: 139 mmol/L (ref 135–145)

## 2015-12-08 LAB — COOXEMETRY PANEL
Carboxyhemoglobin: 1.1 % (ref 0.5–1.5)
Methemoglobin: 0.8 % (ref 0.0–1.5)
O2 Saturation: 52.1 %
Total hemoglobin: 12.2 g/dL (ref 12.0–16.0)

## 2015-12-08 LAB — GLUCOSE, CAPILLARY
GLUCOSE-CAPILLARY: 100 mg/dL — AB (ref 65–99)
Glucose-Capillary: 96 mg/dL (ref 65–99)
Glucose-Capillary: 123 mg/dL — ABNORMAL HIGH (ref 65–99)
Glucose-Capillary: 114 mg/dL — ABNORMAL HIGH (ref 65–99)

## 2015-12-08 MED ORDER — FUROSEMIDE 10 MG/ML IJ SOLN
40.0000 mg | Freq: Once | INTRAMUSCULAR | Status: AC
Start: 2015-12-08 — End: 2015-12-08
  Administered 2015-12-08: 40 mg via INTRAVENOUS
  Filled 2015-12-08: qty 4

## 2015-12-08 MED ORDER — POTASSIUM CHLORIDE CRYS ER 20 MEQ PO TBCR
40.0000 meq | EXTENDED_RELEASE_TABLET | Freq: Once | ORAL | Status: AC
  Administered 2015-12-08: 40 meq via ORAL
  Filled 2015-12-08: qty 2

## 2015-12-08 NOTE — Progress Notes (Signed)
SUBJECTIVE: Says breathing is "a little better".  Denies chest pain. Feels better on milrinone.   ROS: Other than pertinent positives in "Subjective", all others were reviewed and found to be negative.   Intake/Output Summary (Last 24 hours) at 12/08/15 0954 Last data filed at 12/08/15 0848  Gross per 24 hour  Intake          1410.04 ml  Output             6475 ml  Net         -5064.96 ml    Current Facility-Administered Medications  Medication Dose Route Frequency Provider Last Rate Last Dose  . 0.9 %  sodium chloride infusion  250 mL Intravenous PRN [name] G Barrett, PA-C      . acetaminophen (TYLENOL) tablet 650 mg  650 mg Oral Q4H PRN [name] G Barrett, PA-C      . albuterol (PROVENTIL) (2.5 MG/3ML) 0.083% nebulizer solution 2.5 mg  2.5 mg Inhalation Q4H PRN Rollene RotundaJames Hochrein, MD      . ALPRAZolam Prudy Feeler(XANAX) tablet 0.25 mg  0.25 mg Oral BID PRN Joline Salthonda G Barrett, PA-C   0.25 mg at 12/07/15 1452  . aspirin EC tablet 81 mg  81 mg Oral Daily Joline SaltRhonda G Barrett, PA-C   81 mg at 12/08/15 0847  . atorvastatin (LIPITOR) tablet 40 mg  40 mg Oral Daily [name] G Barrett, PA-C   40 mg at 12/08/15 0847  . clopidogrel (PLAVIX) tablet 75 mg  75 mg Oral Daily Joline SaltRhonda G Barrett, PA-C   75 mg at 12/08/15 0847  . clotrimazole (LOTRIMIN) 1 % cream   Topical BID PRN Joline Salthonda G Barrett, PA-C      . furosemide (LASIX) tablet 40 mg  40 mg Oral Daily Rollene RotundaJames Hochrein, MD   40 mg at 12/08/15 0847  . hydrOXYzine (ATARAX/VISTARIL) tablet 25 mg  25 mg Oral QHS PRN Joline Salthonda G Barrett, PA-C   25 mg at 12/07/15 2341  . lisinopril (PRINIVIL,ZESTRIL) tablet 2.5 mg  2.5 mg Oral Daily Joline SaltRhonda G Barrett, PA-C   2.5 mg at 12/07/15 0931  . LORazepam (ATIVAN) tablet 1 mg  1 mg Oral Once Azalee CoursePaul S Corotto, MD      . milrinone (PRIMACOR) 20 MG/100 ML (0.2 mg/mL) infusion  0.125 mcg/kg/min Intravenous Continuous Joline SaltRhonda G Barrett, PA-C 3.4 mL/hr at 12/07/15 2036 0.125 mcg/kg/min at 12/07/15 2036  . nitroGLYCERIN (NITROSTAT) SL tablet  0.4 mg  0.4 mg Sublingual Q5 Min x 3 PRN [name] G Barrett, PA-C      . ondansetron (ZOFRAN) injection 4 mg  4 mg Intravenous Q6H PRN [name] G Barrett, PA-C      . sodium chloride flush (NS) 0.9 % injection 10-40 mL  10-40 mL Intracatheter PRN Rollene RotundaJames Hochrein, MD      . sodium chloride flush (NS) 0.9 % injection 3 mL  3 mL Intravenous Q12H [name] G Barrett, PA-C   3 mL at 12/08/15 1000  . sodium chloride flush (NS) 0.9 % injection 3 mL  3 mL Intravenous PRN [name] G Barrett, PA-C      . zolpidem (AMBIEN) tablet 5 mg  5 mg Oral QHS PRN,MR X 1 [name] G Barrett, PA-C        Vitals:   12/07/15 2335 12/08/15 0015 12/08/15 0400 12/08/15 0739  BP:  (!) 92/54 92/61 (!) 100/56  Pulse: (!) 48   74  Resp: 16   15  Temp:  98.1 F (36.7 C) 98.4  F (36.9 C) 98.4 F (36.9 C)  TempSrc:  Oral Oral Oral  SpO2: 98%   95%  Weight:   194 lb 8 oz (88.2 kg)   Height:        PHYSICAL EXAM General: NAD HEENT: Normal. Neck: No JVD, no thyromegaly.  Lungs: Clear to auscultation bilaterally with normal respiratory effort. CV: Nondisplaced PMI.  Regular rate and rhythm, normal S1/S2, no S3/S4, 2/6 apical systolic murmur.  No pretibial edema.   Abdomen: Soft, nontender, no distention.  Neurologic: Alert and oriented x 3.  Psych: Normal affect. Musculoskeletal: No gross deformities. Extremities: No clubbing or cyanosis.   TELEMETRY: Reviewed telemetry pt in sinus rhythm. AV block, PAC's, PVC's, and SVT noted.  LABS: Basic Metabolic Panel:  Recent Labs  16/10/96 0438 12/08/15 0500  NA 138 139  K 3.4* 3.6  CL 104 103  CO2 27 27  GLUCOSE 110* 122*  BUN 23* 22*  CREATININE 1.41* 1.26*  CALCIUM 8.7* 8.7*   Liver Function Tests:  Recent Labs  12/06/15 1537 12/07/15 0438  AST 34 26  ALT 43 35  ALKPHOS 59 53  BILITOT 1.0 1.1  PROT 6.9 5.8*  ALBUMIN 3.6 3.0*   No results for input(s): LIPASE, AMYLASE in the last 72 hours. CBC:  Recent Labs  12/06/15 1539  WBC 6.8  HGB 13.0  HCT 40.6    MCV 93.8  PLT 223   Cardiac Enzymes: No results for input(s): CKTOTAL, CKMB, CKMBINDEX, TROPONINI in the last 72 hours. BNP: Invalid input(s): POCBNP D-Dimer: No results for input(s): DDIMER in the last 72 hours. Hemoglobin A1C: No results for input(s): HGBA1C in the last 72 hours. Fasting Lipid Panel:  Recent Labs  12/07/15 0438  CHOL 122  HDL 25*  LDLCALC 82  TRIG 73  CHOLHDL 4.9   Thyroid Function Tests: No results for input(s): TSH, T4TOTAL, T3FREE, THYROIDAB in the last 72 hours.  Invalid input(s): FREET3 Anemia Panel: No results for input(s): VITAMINB12, FOLATE, FERRITIN, TIBC, IRON, RETICCTPCT in the last 72 hours.  RADIOLOGY: Dg Chest 2 View  Result Date: 12/06/2015 CLINICAL DATA:  Chest pressure, shortness breath, low heart rate. EXAM: CHEST  2 VIEW COMPARISON:  10/25/2015 FINDINGS: Cardiomegaly. Left AICD remains in place, unchanged. Biapical scarring. Lungs otherwise clear. No effusions, edema or acute bony abnormality. IMPRESSION: Cardiomegaly.  No active disease. Electronically Signed   By: Charlett Nose M.D.   On: 12/06/2015 16:32      ASSESSMENT AND PLAN: 1. Acute on chronic systolic HF, EF 15%: Has end stage heart failure, now on IV milrinone. Has put out nearly 5L with one dose of IV Lasix given yesterday. I will give another dose of IV Lasix 40 mg. Will replete potassium to keep K>4. Will ask advanced HF team to see tomorrow.  2. CAD: No ischemic symptoms. Continue ASA, statin, Plavix, ACE, no BB 2nd low HR.  3. Hyperlipidemia: Continue statin.  4. Mobitz I and II heart block: Being followed by EP to determine symptom correlation. Will ask them to see tomorrow.   Prentice Docker, M.D., F.A.C.C.

## 2015-12-08 NOTE — Progress Notes (Signed)
CVP at 2015 16-18. Has been climbing today 6 this am and 11 at 1600. Pt states he does feel more SOB this evening. Lungs clear but decreased throughout. Dr Mayford Knifeurner on call and informed of above with orders received.Dierdre HighmanHall, Charistopher Rumble Marie, RN

## 2015-12-08 NOTE — Progress Notes (Signed)
Pt had run of 3rd degree heart block and a run of afib.  Pt asymptomatic, BP 94/40, heart rate currently 62. MD notified.  Will continue to monitor.

## 2015-12-09 ENCOUNTER — Encounter (HOSPITAL_COMMUNITY): Payer: Self-pay | Admitting: Nurse Practitioner

## 2015-12-09 DIAGNOSIS — I442 Atrioventricular block, complete: Principal | ICD-10-CM

## 2015-12-09 LAB — COOXEMETRY PANEL
Carboxyhemoglobin: 0.9 % (ref 0.5–1.5)
METHEMOGLOBIN: 0.8 % (ref 0.0–1.5)
O2 Saturation: 57.2 %
Total hemoglobin: 12.9 g/dL (ref 12.0–16.0)

## 2015-12-09 LAB — BASIC METABOLIC PANEL
Anion gap: 7 (ref 5–15)
BUN: 23 mg/dL — AB (ref 6–20)
CHLORIDE: 103 mmol/L (ref 101–111)
CO2: 28 mmol/L (ref 22–32)
CREATININE: 1.23 mg/dL (ref 0.61–1.24)
Calcium: 9.3 mg/dL (ref 8.9–10.3)
GFR calc Af Amer: >60 mL/min (ref >60)
GFR calc non Af Amer: >60 mL/min (ref >60)
GLUCOSE: 100 mg/dL — AB (ref 65–99)
Potassium: 4.1 mmol/L (ref 3.5–5.1)
Sodium: 138 mmol/L (ref 135–145)

## 2015-12-09 LAB — GLUCOSE, CAPILLARY: GLUCOSE-CAPILLARY: 102 mg/dL — AB (ref 65–99)

## 2015-12-09 MED ORDER — LISINOPRIL 2.5 MG PO TABS
2.5000 mg | ORAL_TABLET | Freq: Two times a day (BID) | ORAL | Status: DC
  Administered 2015-12-09 – 2015-12-12 (×6): 2.5 mg via ORAL
  Filled 2015-12-09 (×6): qty 1

## 2015-12-09 MED ORDER — FUROSEMIDE 10 MG/ML IJ SOLN
40.0000 mg | Freq: Two times a day (BID) | INTRAMUSCULAR | Status: DC
  Administered 2015-12-09 – 2015-12-10 (×3): 40 mg via INTRAVENOUS
  Filled 2015-12-09 (×3): qty 4

## 2015-12-09 MED ORDER — SPIRONOLACTONE 25 MG PO TABS
12.5000 mg | ORAL_TABLET | Freq: Every day | ORAL | Status: DC
  Administered 2015-12-09: 12.5 mg via ORAL
  Filled 2015-12-09: qty 1

## 2015-12-09 NOTE — Consult Note (Signed)
ELECTROPHYSIOLOGY CONSULT NOTE    Patient ID: [name] MRN: [id number], DOB/AGE: 62/10/1955 62 y.o.  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 male with a past medical history significant for ICM (s/p single chamber ICD as part of MASTER study), chronic systolic heart failure, hypertension, hyperlipidemia, and tobacco abuse.  He reports a several week history of progressive shortness of breath with exertion.  Prior to that, he had stopped working because he "didn't have the energy to go".  His shortness of breath progressed the few days prior to admission to the point where he felt he needed to be evaluated in the ER.  He was found to have low output heart failure and placed on Milrinone and diuresed with IV Lasix. His shortness of breath is improved today but still not at baseline.  EP has been asked to evaluate for device upgrade.   Echo 11/2014 demonstrated EF 15%, grade 2 diastolic dysfunction, mild to moderate AR, LA severely dilated, moderate MR  He denies recent chest pain, nausea, vomiting, fevers, chills. He has not had dizziness, pre-syncope or syncope.   Past Medical History:  Diagnosis Date  . Anxiety   . Automatic implantable cardioverter-defibrillator in situ    a. Medtronic ICD.  [name]. CAD (coronary artery disease)    a.  Severe LAD stenosis 2/2 acute thrombus - BMS 2009;  b. 06/01/11 Cath - LAD 20 isr, 3530m (3.0x16 Veri-flex BMS), OM1 100p, EF 20-25%;  c. 07/2012 Abnl Cardiolite;  d. 08/2012 Cath/PCI: LM nl, LAD patent stents, D2 80-90 (jailed), LCX 90 p/m (4.0x24 Promus Premier DES), OM1 100, OM2 80-90p (3.0x20 Promus Premier DES), RCA patent mid stent, 40d, EF 25%.  e. 10/29/15 LHC stable disease with patent stents  . Chronic systolic CHF  (congestive heart failure), NYHA class 3 (HCC) - low output state 12/12/2014  . Depression   . History of rheumatic fever 1962  . HLD (hyperlipidemia) mixed  . HTN (hypertension)   . Ischemic cardiomyopathy    a.  EF 30-35% 2010;  b.  EF 20-25% by LV gram 08/2012. c. EF 15% by echo 11/2014.  [name]. Myocardial infarction 1998; 2002; ~ 2010  . Paroxysmal ventricular tachycardia (HCC)    a. VFlutter  CL 210 msec  Rx shock 04/2011  . PVC's (premature ventricular contractions)   . RBBB   . TIA (transient ischemic attack)    a. Dx 11/2014, continued on ASA/Plavix.     Surgical History:  Past Surgical History:  Procedure Laterality Date  . CARDIAC CATHETERIZATION     "lots of those" (08/29/2012)  . CARDIAC CATHETERIZATION N/A 10/29/2015   Procedure: Right/Left Heart Cath and Coronary Angiography;  Surgeon: Peter M SwazilandJordan, MD;  Location: [facility] INVASIVE [facility] LAB;  Service: Cardiovascular;  Laterality: N/A;  . CARDIAC DEFIBRILLATOR PLACEMENT     primary prevention for sudden death; Dr. Graciela HusbandsKlein   . CORONARY ANGIOPLASTY WITH STENT PLACEMENT  2002-08/29/2012   "think I had 4 total then 2 today" to LAD 06/01/2011/notes (08/29/2012)  . defibrillator implantation  04/12/02   primary prevention for sudden death; Dr. Graciela HusbandsKlein   . HERNIA REPAIR     "w/gallbladder OR" (08/29/2012)  . LAPAROSCOPIC CHOLECYSTECTOMY    . LEFT HEART CATHETERIZATION WITH CORONARY ANGIOGRAM N/A 06/08/2011   Procedure: LEFT HEART  CATHETERIZATION WITH CORONARY ANGIOGRAM;  Surgeon: Dolores Patty, MD;  Location: [facility] CATH LAB;  Service: Cardiovascular;  Laterality: N/A;  . PERCUTANEOUS CORONARY STENT INTERVENTION (PCI-S) N/A 06/01/2011   Procedure: PERCUTANEOUS CORONARY STENT INTERVENTION (PCI-S);  Surgeon: Peter M Swaziland, MD;  Location: [facility] CATH LAB;  Service: Cardiovascular;  Laterality: N/A;  . PERCUTANEOUS CORONARY STENT INTERVENTION (PCI-S) N/A 08/29/2012   Procedure: PERCUTANEOUS CORONARY STENT INTERVENTION (PCI-S);  Surgeon: Peter M Swaziland, MD;   Location: [facility] CATH LAB;  Service: Cardiovascular;  Laterality: N/A;     Prescriptions Prior to Admission  Medication Sig Dispense Refill Last Dose  . albuterol (PROAIR HFA) 108 (90 Base) MCG/ACT inhaler Inhale 2 puffs into the lungs every 4 (four) hours as needed for wheezing or shortness of breath.   week ago  . ALPRAZolam (XANAX) 0.25 MG tablet Take 1 tablet (0.25 mg total) by mouth 3 (three) times daily as needed for anxiety. (Patient taking differently: Take 0.25 mg by mouth daily as needed for anxiety. ) 10 tablet 0 12/05/2015 at Unknown time  . aspirin EC 81 MG tablet Take 81 mg by mouth daily.   12/06/2015 at Unknown time  . atorvastatin (LIPITOR) 40 MG tablet Take 1 tablet (40 mg total) by mouth daily at 6 PM. (Patient taking differently: Take 40 mg by mouth daily. ) 30 tablet 11 12/06/2015 at Unknown time  . clopidogrel (PLAVIX) 75 MG tablet Take 75 mg by mouth daily.    12/06/2015 at Unknown time  . clotrimazole-betamethasone (LOTRISONE) cream Apply 1 application topically 2 (two) times daily. (Patient taking differently: Apply 1 application topically 2 (two) times daily as needed (eczema). ) 15 g 0 3-4 days ago  . furosemide (LASIX) 40 MG tablet Take 40 mg by mouth See admin instructions. Take 1 tablet (40 mg) by mouth daily, may also take an additional tablet as needed for fluid buildup   12/06/2015 at Unknown time  . hydrOXYzine (VISTARIL) 25 MG capsule Take 25 mg by mouth at bedtime as needed (sleep).   12/05/2015 at Unknown time  . lisinopril (PRINIVIL,ZESTRIL) 2.5 MG tablet Take 2.5 mg by mouth daily.   12/06/2015 at Unknown time  . nitroGLYCERIN (NITROSTAT) 0.4 MG SL tablet Place 1 tablet (0.4 mg total) under the tongue every 5 (five) minutes as needed. For chest pain (Patient taking differently: Place 0.4 mg under the tongue every 5 (five) minutes as needed for chest pain. ) 25 tablet 11 couple months ago    Inpatient Medications:  . aspirin EC  81 mg Oral Daily  . atorvastatin   40 mg Oral Daily  . clopidogrel  75 mg Oral Daily  . furosemide  40 mg Oral Daily  . lisinopril  2.5 mg Oral Daily  . LORazepam  1 mg Oral Once  . sodium chloride flush  3 mL Intravenous Q12H    Allergies: No Known Allergies  Social History   Social History  . Marital status: Married    Spouse name: N/A  . Number of children: N/A  . Years of education: N/A   Occupational History  . Not on file.   Social History Main Topics  . Smoking status: Current Every Day Smoker    Packs/day: 0.50    Years: 44.00    Types: Cigarettes    Last attempt to quit: 05/27/2011  . Smokeless tobacco: Never Used  . Alcohol use No     Comment: 08/29/2012 "6-8 beers once/wk". Has not had alcohol in the last 6-8 months  .  Drug use: No  . Sexual activity: Not Currently   Other Topics Concern  . Not on file   Social History Narrative   Married; full time.      Family History  Problem Relation Age of Onset  . Cancer Father   . Cancer Sister     twin sister and only one has cancer, unknown what kind  . Hypertension Brother   . Cancer      family hx  . Heart attack Neg Hx   . Stroke Neg Hx      Review of Systems: All other systems reviewed and are otherwise negative except as noted above.  Physical Exam: Vitals:   12/08/15 2248 12/09/15 0013 12/09/15 0414 12/09/15 0800  BP: (!) 115/55 (!) 91/59 (!) 106/50 119/66  Pulse: 79 (!) 53 87 (!) 57  Resp: (!) 23 (!) 24 14 20   Temp: 97.7 F (36.5 C) 97.7 F (36.5 C) 98 F (36.7 C) 98 F (36.7 C)  TempSrc: Oral Oral Oral Oral  SpO2: 100% 100% 99% 99%  Weight:   193 lb 8 oz (87.8 kg)   Height:        GEN- The patient is well appearing, alert and oriented x 3 today.   HEENT: normocephalic, atraumatic; sclera clear, conjunctiva pink; hearing intact; oropharynx clear; neck supple  Lungs- Clear to ausculation bilaterally, normal work of breathing.  No wheezes, rales, rhonchi Heart- Irregular rate and rhythm, 2/6 SEM GI- soft, non-tender,  non-distended, bowel sounds present  Extremities- no clubbing, cyanosis, or edema  MS- no significant deformity or atrophy Skin- warm and dry, no rash or lesion Psych- euthymic mood, full affect Neuro- strength and sensation are intact  Labs:   Lab Results  Component Value Date   WBC 6.8 12/06/2015   HGB 13.0 12/06/2015   HCT 40.6 12/06/2015   MCV 93.8 12/06/2015   PLT 223 12/06/2015    Recent Labs Lab 12/07/15 0438  12/09/15 0500  NA 138  < > 138  K 3.4*  < > 4.1  CL 104  < > 103  CO2 27  < > 28  BUN 23*  < > 23*  CREATININE 1.41*  < > 1.23  CALCIUM 8.7*  < > 9.3  PROT 5.8*  --   --   BILITOT 1.1  --   --   ALKPHOS 53  --   --   ALT 35  --   --   AST 26  --   --   GLUCOSE 110*  < > 100*  < > = values in this interval not displayed.    Radiology/Studies: Dg Chest 2 View Result Date: 12/06/2015 CLINICAL DATA:  Chest pressure, shortness breath, low heart rate. EXAM: CHEST  2 VIEW COMPARISON:  10/25/2015 FINDINGS: Cardiomegaly. Left AICD remains in place, unchanged. Biapical scarring. Lungs otherwise clear. No effusions, edema or acute bony abnormality. IMPRESSION: Cardiomegaly.  No active disease. Electronically Signed   By: Charlett Nose M.D.   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  Acute on chronic systolic heart failure Currently on MIlrinone and IV lasix with some improvement in symptoms Will ask HF team to see today to help manage  2.  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  With RBBB and QRS >172msec, LV lead is reasonable.  However, he may have better result with His Bundle pacing.  Will discuss with Dr Graciela Husbands.  3.  CAD  No recent  ischemic symptoms Continue medical therapy  Dr Graciela Husbands to see later today.  I have tentatively placed on board for upgrade on Thursday with Dr Elberta Fortis.    Signed, Gypsy Balsam, NP 12/09/2015 8:54 AM  Patient seen and examined. Agree that AV synchrony is key. Right bundle branch block creates various options for pacing; standard RV pacing might work with timing directed at changing the QRS, CRT might be of benefit or His bundle pacing. I'm not sure that we know which is best. With his right ventricular lead, I might try AV pacing and see what we could do with QRS narrowing simply with varying AV timing.  Appreciate input from CHF team

## 2015-12-09 NOTE — Progress Notes (Signed)
Patient ID: [name], male   DOB: 09/25/1955, 60 y.o.   MRN: [id number]   60 yo with history of CAD s/p multiple PCI, ischemic cardiomyopathy (EF 15% 10/16), VT with Medtronic ICD, smoking, h/o TIA who was admitted with low output CHF.  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   LHC/RHC (9/17): 85% ostial D2, 99% ostial OM1 (no change from prior) with patent LAD, LCX, OM2, and RCA stents; mean RA 8, PA 58/27 mean 39, mean PCWP 29, CI 2.25, PVR 2.1.   Echo (10/16) with EF 15%, mild to moderate AI, moderate MR, mildly decreased RV systolic function.   He had PICC line placed, initial co-ox was 31% and he was started on milrinone 0.125.  Co-ox 52% yesterday and 57% today.  He got 1 dose of 40 mg IV Lasix on Saturday and 1 dose on Sunday.  He has diuresed reasonably well since admission.  Was written for po Lasix today.   He remains short of breath at times at rest.  Has not really been out of bed.  Creatinine stable.  CVP 14.   Scheduled Meds: . aspirin EC  81 mg Oral Daily  . atorvastatin  40 mg Oral Daily  . clopidogrel  75 mg Oral Daily  . furosemide  40 mg Intravenous BID  . lisinopril  2.5 mg Oral BID  . LORazepam  1 mg Oral Once  . sodium chloride flush  3 mL Intravenous Q12H  . spironolactone  12.5 mg Oral Daily   Continuous Infusions: . milrinone 0.125 mcg/kg/min (12/09/15 0235)   PRN Meds:.sodium chloride, acetaminophen, albuterol, ALPRAZolam, clotrimazole, hydrOXYzine, nitroGLYCERIN, ondansetron (ZOFRAN) IV, sodium chloride flush, sodium chloride flush, zolpidem    Vitals:   12/08/15 2248 12/09/15 0013 12/09/15 0414 12/09/15 0800  BP: (!) 115/55 (!) 91/59 (!) 106/50 119/66  Pulse: 79 (!) 53 87 (!) 57  Resp: (!) 23 (!) 24 14 20  Temp: 97.7 F (36.5 C) 97.7 F (36.5 C) 98 F (36.7 C) 98 F (36.7 C)  TempSrc: Oral Oral Oral Oral  SpO2: 100% 100% 99% 99%  Weight:   193 lb 8 oz (87.8 kg)   Height:        Intake/Output Summary (Last 24  hours) at 12/09/15 0948 Last data filed at 12/09/15 0600  Gross per 24 hour  Intake           1158.2 ml  Output             27 50 ml  Net          -1591.8 ml    LABS: Basic Metabolic Panel:  Recent Labs  57/84/6910/15/17 0500 12/09/15 0500  NA 139 138  K 3.6 4.1  CL 103 103  CO2 27 28  GLUCOSE 122* 100*  BUN 22* 23*  CREATININE 1.26* 1.23  CALCIUM 8.7* 9.3   Liver Function Tests:  Recent Labs  12/06/15 1537 12/07/15 0438  AST 34 26  ALT 43 35  ALKPHOS 59 53  BILITOT 1.0 1.1  PROT 6.9 5.8*  ALBUMIN 3.6 3.0*   No results for input(s): LIPASE, AMYLASE in the last 72 hours. CBC:  Recent Labs  12/06/15 1539  WBC 6.8  HGB 13.0  HCT 40.6  MCV 93.8  PLT 223   Cardiac Enzymes: No results for input(s): CKTOTAL, CKMB, CKMBINDEX, TROPONINI in the last 72 hours. BNP: Invalid input(s): POCBNP D-Dimer: No results for input(s): DDIMER in the last  72 hours. Hemoglobin A1C: No results for input(s): HGBA1C in the last 72 hours. Fasting Lipid Panel:  Recent Labs  12/07/15 0438  CHOL 122  HDL 25*  LDLCALC 82  TRIG 73  CHOLHDL 4.9   Thyroid Function Tests: No results for input(s): TSH, T4TOTAL, T3FREE, THYROIDAB in the last 72 hours.  Invalid input(s): FREET3 Anemia Panel: No results for input(s): VITAMINB12, FOLATE, FERRITIN, TIBC, IRON, RETICCTPCT in the last 72 hours.  RADIOLOGY: Dg Chest 2 View  Result Date: 12/06/2015 CLINICAL DATA:  Chest pressure, shortness breath, low heart rate. EXAM: CHEST  2 VIEW COMPARISON:  10/25/2015 FINDINGS: Cardiomegaly. Left AICD remains in place, unchanged. Biapical scarring. Lungs otherwise clear. No effusions, edema or acute bony abnormality. IMPRESSION: Cardiomegaly.  No active disease. Electronically Signed   By: Charlett Nose M.D.   On: 12/06/2015 16:32    PHYSICAL EXAM General: NAD Neck: JVP 14-16 cm, no thyromegaly or thyroid nodule.  Lungs: Clear to auscultation bilaterally with normal respiratory effort. CV: Lateral  PMI.  Heart regular S1/S2, no S3/S4, 3/6 HSM apex.  Trace ankle edema.    Abdomen: Soft, nontender, no hepatosplenomegaly, no distention.  Neurologic: Alert and oriented x 3.  Psych: Normal affect. Extremities: No clubbing or cyanosis.   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  1. Acute on chronic systolic CHF: With low cardiac output noted.  Ischemic cardiomyopathy, has Medtronic ICD.  On exam, he remains significantly volume overloaded with CVP elevation from PICC.  Co-ox markedly low at admission, 31%.  Co-ox remains marginal on milrinone 0.125 (52%, 57%).  - Stop po Lasix, start Lasix 40 mg IV bid and give dose now.  - Increase milrinone to 0.25 mcg/kg/min for now.  Continue to follow co-ox and CVP.  - Increase lisinopril to 2.5 mg bid.  - Add spironolactone 12.5 daily.  - Add digoxin after patient has device upgrade.  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  Suspect contribution to HF with loss of AV synchrony (and also has been bradycardic).  He has long RBBB, 172 msec QRS.  Agree with upgrade to CRT versus His bundle pacing, await Dr. Odessa Fleming evaluation . - Repeat echo (last was 1 year ago).  - I am concerned that he may be nearing the need for advanced therapies.  Would not be transplant candidate at this time with active smoking.  LVAD would be an option.  Has 3 children and lives with sister.  Will follow his clinical trajectory closely. Hopefully can titrate off milrinone with device upgrade and aggressive medical management.  2. CAD: Stable, no chest pain. Recent LHC in 9/17 with stable coronary disease.  - Continue ASA 81, Plavix, and statin.  3. Smoking: I encouraged him to  quit.  4. 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  Suspect contribution to HF with loss of AV synchrony (and also has been bradycardic).  He has long RBBB, 172 msec QRS.  Agree with upgrade to CRT versus His bundle pacing, await Dr. Odessa Fleming evaluation .  45 minutes critical care time.   Marca Ancona 12/09/2015 9:59 AM

## 2015-12-10 ENCOUNTER — Inpatient Hospital Stay (HOSPITAL_COMMUNITY): Payer: Medicaid Other

## 2015-12-10 DIAGNOSIS — I255 Ischemic cardiomyopathy: Secondary | ICD-10-CM

## 2015-12-10 LAB — BASIC METABOLIC PANEL
ANION GAP: 7 (ref 5–15)
BUN: 25 mg/dL — ABNORMAL HIGH (ref 6–20)
CALCIUM: 9 mg/dL (ref 8.9–10.3)
CO2: 28 mmol/L (ref 22–32)
Chloride: 101 mmol/L (ref 101–111)
Creatinine, Ser: 1.18 mg/dL (ref 0.61–1.24)
GLUCOSE: 102 mg/dL — AB (ref 65–99)
POTASSIUM: 3.8 mmol/L (ref 3.5–5.1)
Sodium: 136 mmol/L (ref 135–145)

## 2015-12-10 LAB — CBC
HCT: 39.1 % (ref 39.0–52.0)
Hemoglobin: 13 g/dL (ref 13.0–17.0)
MCH: 30.2 pg (ref 26.0–34.0)
MCHC: 33.2 g/dL (ref 30.0–36.0)
MCV: 90.9 fL (ref 78.0–100.0)
PLATELETS: 221 10*3/uL (ref 150–400)
RBC: 4.3 MIL/uL (ref 4.22–5.81)
RDW: 15.2 % (ref 11.5–15.5)
WBC: 4.8 10*3/uL (ref 4.0–10.5)

## 2015-12-10 LAB — ECHOCARDIOGRAM COMPLETE
HEIGHTINCHES: 71 in
Weight: 3091.2 oz

## 2015-12-10 LAB — COOXEMETRY PANEL
CARBOXYHEMOGLOBIN: 1.8 % — AB (ref 0.5–1.5)
METHEMOGLOBIN: 0.6 % (ref 0.0–1.5)
O2 SAT: 84.6 %
Total hemoglobin: 13.2 g/dL (ref 12.0–16.0)

## 2015-12-10 MED ORDER — CHLORHEXIDINE GLUCONATE 4 % EX LIQD
60.0000 mL | Freq: Once | CUTANEOUS | Status: AC
  Administered 2015-12-10: 4 via TOPICAL
  Filled 2015-12-10: qty 60

## 2015-12-10 MED ORDER — SODIUM CHLORIDE 0.9 % IV SOLN
INTRAVENOUS | Status: DC
  Administered 2015-12-11: 06:00:00 via INTRAVENOUS

## 2015-12-10 MED ORDER — CEFAZOLIN SODIUM-DEXTROSE 2-4 GM/100ML-% IV SOLN
2.0000 g | INTRAVENOUS | Status: AC
  Administered 2015-12-11: 2 g via INTRAVENOUS
  Filled 2015-12-10: qty 100

## 2015-12-10 MED ORDER — PERFLUTREN LIPID MICROSPHERE
INTRAVENOUS | Status: AC
  Administered 2015-12-10: 2 mL via INTRAVENOUS
  Filled 2015-12-10: qty 10

## 2015-12-10 MED ORDER — SPIRONOLACTONE 25 MG PO TABS
25.0000 mg | ORAL_TABLET | Freq: Every day | ORAL | Status: DC
  Administered 2015-12-10 – 2015-12-14 (×5): 25 mg via ORAL
  Filled 2015-12-10 (×5): qty 1

## 2015-12-10 MED ORDER — SODIUM CHLORIDE 0.9 % IV SOLN: INTRAVENOUS | Status: DC

## 2015-12-10 MED ORDER — SODIUM CHLORIDE 0.9 % IR SOLN
80.0000 mg | Status: AC
  Administered 2015-12-11: 80 mg
  Filled 2015-12-10: qty 2

## 2015-12-10 MED ORDER — ENOXAPARIN SODIUM 40 MG/0.4ML ~~LOC~~ SOLN
40.0000 mg | SUBCUTANEOUS | Status: DC
  Administered 2015-12-10: 40 mg via SUBCUTANEOUS
  Filled 2015-12-10: qty 0.4

## 2015-12-10 MED ORDER — PERFLUTREN LIPID MICROSPHERE
1.0000 mL | INTRAVENOUS | Status: AC | PRN
  Administered 2015-12-10: 2 mL via INTRAVENOUS
  Filled 2015-12-10: qty 10

## 2015-12-10 MED ORDER — TRAMADOL HCL 50 MG PO TABS
50.0000 mg | ORAL_TABLET | Freq: Four times a day (QID) | ORAL | Status: DC | PRN
  Administered 2015-12-10 – 2015-12-11 (×3): 50 mg via ORAL
  Filled 2015-12-10 (×3): qty 1

## 2015-12-10 MED ORDER — CHLORHEXIDINE GLUCONATE 4 % EX LIQD
60.0000 mL | Freq: Once | CUTANEOUS | Status: AC
  Administered 2015-12-11: 4 via TOPICAL
  Filled 2015-12-10: qty 60

## 2015-12-10 NOTE — Progress Notes (Signed)
Patient ID: [name], male   DOB: 01/07/1956, 60 y.o.   MRN: [id number]   60 yo with history of CAD s/p multiple PCI, ischemic cardiomyopathy (EF 15% 10/16), VT with Medtronic ICD, smoking, h/o TIA who was admitted with low output CHF.  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   LHC/RHC (9/17): 85% ostial D2, 99% ostial OM1 (no change from prior) with patent LAD, LCX, OM2, and RCA stents; mean RA 8, PA 58/27 mean 39, mean PCWP 29, CI 2.25, PVR 2.1.   Echo (10/16) with EF 15%, mild to moderate AI, moderate MR, mildly decreased RV systolic function.   He had PICC line placed, initial co-ox was 31% and he was started on milrinone 0.125.  Co-ox 52% and 57% on milrinone 0.125.  Milrinone increased to 0.25, co-ox 84% today.  He got IV Lasix yesterday and diuresed well.  CVP down to 9 today.   Breathing improved.  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   Scheduled Meds: . aspirin EC  81 mg Oral Daily  . atorvastatin  40 mg Oral Daily  . clopidogrel  75 mg Oral Daily  . furosemide  40 mg Intravenous BID  . lisinopril  2.5 mg Oral BID  . LORazepam  1 mg Oral Once  . sodium chloride flush  3 mL Intravenous Q12H  . spironolactone  25 mg Oral Daily   Continuous Infusions: . milrinone 0.25 mcg/kg/min (12/09/15 2200)   PRN Meds:.sodium chloride, acetaminophen, albuterol, ALPRAZolam, clotrimazole, hydrOXYzine, nitroGLYCERIN, ondansetron (ZOFRAN) IV, sodium chloride flush, sodium chloride flush, zolpidem    Vitals:   12/09/15 2012 12/10/15 0300 12/10/15 0740 12/10/15 0815  BP: (!) 126/52 (!) 89/46 96/62 125/71  Pulse: (!) 46 78 74   Resp: 16  18   Temp: 97.9 F (36.6 C) 97.7 F (36.5 C) 97.6 F (36.4 C)   TempSrc: Oral Oral Oral   SpO2: 98% 97% 98%   Weight:  193 lb 3.2 oz (87.6 kg)    Height:        Intake/Output Summary (Last 24 hours) at 12/10/15 0835 Last data filed at 12/09/15 2351  Gross per 24 hour  Intake           941.19 ml  Output              3550 ml  Net         -2608.81 ml    LABS: Basic Metabolic Panel:  Recent Labs  82/95/6210/16/17 0500 12/10/15 0600  NA 138 136  K 4.1 3.8  CL 103 101  CO2 28 28  GLUCOSE 100* 102*  BUN 23* 25*  CREATININE 1.23 1.18  CALCIUM 9.3 9.0   Liver Function Tests: No results for input(s): AST, ALT, ALKPHOS, BILITOT, PROT, ALBUMIN in the last 72 hours. No results for input(s): LIPASE, AMYLASE in the last 72 hours. CBC:  Recent Labs  12/10/15 0600  WBC 4.8  HGB 13.0  HCT 39.1  MCV 90.9  PLT 221   Cardiac Enzymes: No results for input(s): CKTOTAL, CKMB, CKMBINDEX, TROPONINI in the last 72 hours. BNP: Invalid input(s): POCBNP D-Dimer: No results for input(s): DDIMER in the last 72 hours. Hemoglobin A1C: No results for input(s): HGBA1C in the last 72 hours. Fasting Lipid Panel: No results for input(s): CHOL, HDL, LDLCALC, TRIG, CHOLHDL, LDLDIRECT in the last 72 hours. Thyroid Function Tests: No results for input(s): TSH, T4TOTAL, T3FREE, THYROIDAB in the last 72  hours.  Invalid input(s): FREET3 Anemia Panel: No results for input(s): VITAMINB12, FOLATE, FERRITIN, TIBC, IRON, RETICCTPCT in the last 72 hours.  RADIOLOGY: Dg Chest 2 View  Result Date: 12/06/2015 CLINICAL DATA:  Chest pressure, shortness breath, low heart rate. EXAM: CHEST  2 VIEW COMPARISON:  10/25/2015 FINDINGS: Cardiomegaly. Left AICD remains in place, unchanged. Biapical scarring. Lungs otherwise clear. No effusions, edema or acute bony abnormality. IMPRESSION: Cardiomegaly.  No active disease. Electronically Signed   By: Charlett Nose M.D.   On: 12/06/2015 16:32    PHYSICAL EXAM General: NAD Neck: JVP 8 cm, no thyromegaly or thyroid nodule.  Lungs: Clear to auscultation bilaterally with normal respiratory effort. CV: Lateral PMI.  Heart irregular S1/S2, no S3/S4, 3/6 HSM apex.  No edema.    Abdomen: Soft, nontender, no hepatosplenomegaly, no distention.  Neurologic: Alert and oriented x 3.  Psych: Normal  affect. Extremities: No clubbing or cyanosis.   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  1. Acute on chronic systolic CHF: With low cardiac output noted.  Ischemic cardiomyopathy, has Medtronic ICD.  Co-ox markedly low at admission, 31%.  Co-ox up to 84% today on milrinone 0.25.  Good diuresis yesterday, CVP down to 9.  - He got IV Lasix this morning.  He will get device tomorrow and will need some contrast.  Will hold Lasix for now until after device tomorrow.  - Continue milrinone 0.25 for now until after device placed, then will try to wean.  Continue to follow co-ox and CVP.  - Continue lisinopril 2.5 mg bid.  - Increase spironolactone to 25 mg daily.  - Add digoxin after patient has device upgrade.  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  Suspect contribution to HF with loss of AV synchrony (and also has been bradycardic).  He has long RBBB, 172 msec QRS.  Per Dr Graciela Husbands, will place A and RV leads.  If QRS narrows, RV lead will be all he gets.  If QRS does not narrow, will place His bundle lead. - Repeat echo (last was 1 year ago).  - I am concerned that he may be nearing the need for advanced therapies.  Would not be transplant candidate at this time with active smoking.  LVAD would be an option.  Has 3 children and lives with sister.  Will follow his clinical trajectory closely. Hopefully can titrate off milrinone with device upgrade and aggressive medical management.  2. CAD: Stable, no chest pain. Recent LHC in 9/17 with stable coronary disease.  - Continue ASA 81, Plavix, and statin.  3. Smoking: I encouraged him to quit.  4. Rhythm: As above, noted to have type 1 2nd degree AVB and  2:1 AVB.  Suspect contribution to HF with loss of AV synchrony (and also has been bradycardic).  He has long RBBB, 172 msec QRS.  Per Dr Graciela Husbands, will place A and RV leads.  If QRS narrows, RV lead will be all he gets.  If QRS does not narrow, will place His bundle lead.  Marca Ancona 12/10/2015 8:35 AM

## 2015-12-10 NOTE — Progress Notes (Signed)
  Echocardiogram 2D Echocardiogram with definity has been performed.  [name], Aisa Schoeppner M 12/10/2015, 4:10 PM

## 2015-12-10 NOTE — Consult Note (Signed)
Patient Name: Hermenia Bers Hovland      SUBJECTIVE:   Past Medical History:  Diagnosis Date  . Anxiety   . Automatic implantable cardioverter-defibrillator in situ    a. Medtronic ICD.  [name] CAD (coronary artery disease)    a.  Severe LAD stenosis 2/2 acute thrombus - BMS 2009;  b. 06/01/11 Cath - LAD 20 isr, 2m (3.0x16 Veri-flex BMS), OM1 100p, EF 20-25%;  c. 07/2012 Abnl Cardiolite;  d. 08/2012 Cath/PCI: LM nl, LAD patent stents, D2 80-90 (jailed), LCX 90 p/m (4.0x24 Promus Premier DES), OM1 100, OM2 80-90p (3.0x20 Promus Premier DES), RCA patent mid stent, 40d, EF 25%.  e. 10/29/15 LHC stable disease with patent stents  . Chronic systolic CHF (congestive heart failure), NYHA class 3 (HCC) - low output state 12/12/2014  . Depression   . Dyspnea   . History of rheumatic fever 1962  . HLD (hyperlipidemia) mixed  . HTN (hypertension)   . Ischemic cardiomyopathy    a.  EF 30-35% 2010;  b.  EF 20-25% by LV gram 08/2012. c. EF 15% by echo 11/2014.  [name] Myocardial infarction 1998; 2002; ~ 2010  . Paroxysmal ventricular tachycardia (HCC)    a. VFlutter  CL 210 msec  Rx shock 04/2011  . PVC's (premature ventricular contractions)   . RBBB   . TIA (transient ischemic attack)    a. Dx 11/2014, continued on ASA/Plavix.    Scheduled Meds:  Scheduled Meds: . aspirin EC  81 mg Oral Daily  . atorvastatin  40 mg Oral Daily  . clopidogrel  75 mg Oral Daily  . furosemide  40 mg Intravenous BID  . lisinopril  2.5 mg Oral BID  . LORazepam  1 mg Oral Once  . sodium chloride flush  3 mL Intravenous Q12H  . spironolactone  12.5 mg Oral Daily   Continuous Infusions: . milrinone 0.25 mcg/kg/min (12/09/15 2200)   sodium chloride, acetaminophen, albuterol, ALPRAZolam, clotrimazole, hydrOXYzine, nitroGLYCERIN, ondansetron (ZOFRAN) IV, sodium chloride flush, sodium chloride flush, zolpidem    PHYSICAL EXAM Vitals:   12/09/15 1700 12/09/15 2012 12/10/15 0300 12/10/15 0740  BP: (!) 110/52 (!) 126/52  (!) 89/46 96/62  Pulse: (!) 58 (!) 46 78 74  Resp: 16 16  18   Temp: 98.1 F (36.7 C) 97.9 F (36.6 C) 97.7 F (36.5 C) 97.6 F (36.4 C)  TempSrc: Oral Oral Oral Oral  SpO2: 99% 98% 97% 98%  Weight:   193 lb 3.2 oz (87.6 kg)   Height:        Well developed and nourished in no acute distress HENT normal Neck supple with JVP-flat Clear Regular rate and rhythm, no murmurs or gallops Abd-soft with active BS No Clubbing cyanosis edema Skin-warm and dry cap refill < 3 sec A & Oriented  Grossly normal sensory and motor function PICC in place   TELEMETRY: Reviewed telemetry pt in mostly 1:1 conduction  Some ongoing heart block   Intake/Output Summary (Last 24 hours) at 12/10/15 0743 Last data filed at 12/09/15 2351  Gross per 24 hour  Intake          1181.19 ml  Output             3750 ml  Net         -2568.81 ml    LABS:  Personally reviewed   Basic Metabolic Panel:  Recent Labs Lab 12/06/15 1539 12/07/15 0438 12/08/15 0500 12/09/15 0500 12/10/15 0600  NA 138 138  139 138 136  K 3.7 3.4* 3.6 4.1 3.8  CL 104 104 103 103 101  CO2 24 27 27 28 28   GLUCOSE 91 110* 122* 100* 102*  BUN 20 23* 22* 23* 25*  CREATININE 1.38* 1.41* 1.26* 1.23 1.18  CALCIUM 9.1 8.7* 8.7* 9.3 9.0   Cardiac Enzymes: No results for input(s): CKTOTAL, CKMB, CKMBINDEX, TROPONINI in the last 72 hours. CBC:  Recent Labs Lab 12/06/15 1539 12/10/15 0600  WBC 6.8 4.8  HGB 13.0 13.0  HCT 40.6 39.1  MCV 93.8 90.9  PLT 223 221   PROTIME: No results for input(s): LABPROT, INR in the last 72 hours. Liver Function Tests: No results for input(s): AST, ALT, ALKPHOS, BILITOT, PROT, ALBUMIN in the last 72 hours. No results for input(s): LIPASE, AMYLASE in the last 72 hours. BNP: BNP (last 3 results)  Recent Labs  06/19/15 1526 07/09/15 1308 12/06/15 1537  BNP 1,149.4* 324.1* 1,701.1*     Coox 87% ????    ASSESSMENT AND PLAN:  Principal Problem:   Chronic systolic CHF (congestive  heart failure), NYHA class 3 (HCC) - low output state Active Problems:   Ischemic cardiomyopathy   HLD (hyperlipidemia)  Good diuresis, will discuss with D DM about backing down on diuretic with increasing Bunn  Cr As well as timing for upgrade.  Needs Atrial lead and something.  We have no idea but I think efforts at His placement would be reasonable, with QRS narrowing of 20% recently reported as target ( in pts with CRT failure) correlating with improved LV function May try and do this tomorrow I would probably do A lead, see if we can shorten QRS duration Lillette Boxer( [employer])  If that is unsuccessful, try His with same target and if not LV lead Low BP precludes uptitration of afterload today    Signed, Sherryl MangesSteven Calea Hribar MD  12/10/2015

## 2015-12-11 ENCOUNTER — Encounter (HOSPITAL_COMMUNITY)
Admission: EM | Disposition: A | Discharge status: Home Health | Payer: Self-pay | Source: Home / Self Care | Attending: Cardiology

## 2015-12-11 ENCOUNTER — Encounter: Payer: Self-pay | Admitting: *Deleted

## 2015-12-11 DIAGNOSIS — I5023 Acute on chronic systolic (congestive) heart failure: Secondary | ICD-10-CM

## 2015-12-11 DIAGNOSIS — I255 Ischemic cardiomyopathy: Secondary | ICD-10-CM

## 2015-12-11 HISTORY — PX: EP IMPLANTABLE DEVICE: SHX172B

## 2015-12-11 HISTORY — PX: PERIPHERAL VASCULAR CATHETERIZATION: SHX172C

## 2015-12-11 LAB — BASIC METABOLIC PANEL
ANION GAP: 8 (ref 5–15)
BUN: 27 mg/dL — AB (ref 6–20)
CHLORIDE: 96 mmol/L — AB (ref 101–111)
CO2: 28 mmol/L (ref 22–32)
Calcium: 9.1 mg/dL (ref 8.9–10.3)
Creatinine, Ser: 1.29 mg/dL — ABNORMAL HIGH (ref 0.61–1.24)
GFR calc Af Amer: >60 mL/min (ref >60)
GFR, EST NON AFRICAN AMERICAN: 58 mL/min — AB (ref >60)
GLUCOSE: 102 mg/dL — AB (ref 65–99)
POTASSIUM: 3.9 mmol/L (ref 3.5–5.1)
Sodium: 132 mmol/L — ABNORMAL LOW (ref 135–145)

## 2015-12-11 LAB — SURGICAL PCR SCREEN
MRSA, PCR: NEGATIVE
Staphylococcus aureus: NEGATIVE

## 2015-12-11 LAB — COOXEMETRY PANEL
CARBOXYHEMOGLOBIN: 1.8 % — AB (ref 0.5–1.5)
METHEMOGLOBIN: 0.3 % (ref 0.0–1.5)
O2 Saturation: 60.9 %
TOTAL HEMOGLOBIN: 13.2 g/dL (ref 12.0–16.0)

## 2015-12-11 SURGERY — BIV UPGRADE

## 2015-12-11 MED ORDER — HEPARIN (PORCINE) IN NACL 2-0.9 UNIT/ML-% IJ SOLN
INTRAMUSCULAR | Status: DC | PRN
  Administered 2015-12-11: 19:00:00

## 2015-12-11 MED ORDER — SODIUM CHLORIDE 0.9 % IR SOLN
Status: AC
  Filled 2015-12-11: qty 2

## 2015-12-11 MED ORDER — LIDOCAINE HCL (PF) 1 % IJ SOLN
INTRAMUSCULAR | Status: AC
  Filled 2015-12-11: qty 60

## 2015-12-11 MED ORDER — FENTANYL CITRATE (PF) 100 MCG/2ML IJ SOLN
INTRAMUSCULAR | Status: AC
  Filled 2015-12-11: qty 2

## 2015-12-11 MED ORDER — IOPAMIDOL (ISOVUE-370) INJECTION 76%
INTRAVENOUS | Status: DC | PRN
  Administered 2015-12-11: 20 mL via INTRAVENOUS

## 2015-12-11 MED ORDER — ACETAMINOPHEN 325 MG PO TABS: 325.0000 mg | ORAL_TABLET | ORAL | Status: DC | PRN

## 2015-12-11 MED ORDER — CEFAZOLIN IN D5W 1 GM/50ML IV SOLN
1.0000 g | Freq: Four times a day (QID) | INTRAVENOUS | Status: AC
  Administered 2015-12-11 – 2015-12-12 (×3): 1 g via INTRAVENOUS
  Filled 2015-12-11 (×3): qty 50

## 2015-12-11 MED ORDER — FENTANYL CITRATE (PF) 100 MCG/2ML IJ SOLN
INTRAMUSCULAR | Status: DC | PRN
  Administered 2015-12-11 (×4): 25 ug via INTRAVENOUS

## 2015-12-11 MED ORDER — LIDOCAINE HCL (PF) 1 % IJ SOLN
INTRAMUSCULAR | Status: DC | PRN
  Administered 2015-12-11: 45 mL

## 2015-12-11 MED ORDER — ONDANSETRON HCL 4 MG/2ML IJ SOLN: 4.0000 mg | Freq: Four times a day (QID) | INTRAMUSCULAR | Status: DC | PRN

## 2015-12-11 MED ORDER — CEFAZOLIN SODIUM-DEXTROSE 2-4 GM/100ML-% IV SOLN
INTRAVENOUS | Status: AC
  Filled 2015-12-11: qty 100

## 2015-12-11 MED ORDER — IOPAMIDOL (ISOVUE-370) INJECTION 76%
INTRAVENOUS | Status: AC
  Filled 2015-12-11: qty 50

## 2015-12-11 MED ORDER — MIDAZOLAM HCL 5 MG/5ML IJ SOLN
INTRAMUSCULAR | Status: DC | PRN
  Administered 2015-12-11: 1 mg via INTRAVENOUS
  Administered 2015-12-11 (×2): 2 mg via INTRAVENOUS

## 2015-12-11 MED ORDER — MIDAZOLAM HCL 5 MG/5ML IJ SOLN
INTRAMUSCULAR | Status: AC
  Filled 2015-12-11: qty 5

## 2015-12-11 SURGICAL SUPPLY — 15 items
CABLE SURGICAL S-101-97-12 (CABLE) ×4 IMPLANT
CATH CPS DIRECT 135 DS2C020 (CATHETERS) ×2 IMPLANT
CATH RIGHTSITE C315HIS02 (CATHETERS) ×2 IMPLANT
HEMOSTAT SURGICEL 2X4 FIBR (HEMOSTASIS) ×2 IMPLANT
ICD CLARIA MRI DTMA1Q1 (ICD Generator) ×2 IMPLANT
KIT ESSENTIALS PG (KITS) ×2 IMPLANT
LEAD CAPSURE NOVUS 5076-52CM (Lead) ×2 IMPLANT
LEAD QUARTET 1458Q-86CM (Lead) ×2 IMPLANT
PAD DEFIB LIFELINK (PAD) ×2 IMPLANT
SHEATH CLASSIC 7F (SHEATH) ×4 IMPLANT
SHEATH CLASSIC 9.5F (SHEATH) ×2 IMPLANT
SLITTER UNIVERSAL DS2A003 (MISCELLANEOUS) ×2 IMPLANT
TRAY PACEMAKER INSERTION (PACKS) ×2 IMPLANT
WIRE ACUITY WHISPER EDS 4648 (WIRE) ×2 IMPLANT
WIRE HI TORQ VERSACORE-J 145CM (WIRE) ×2 IMPLANT

## 2015-12-11 NOTE — Progress Notes (Signed)
Patient ID: [name], male   DOB: 04-18-1955, 60 y.o.   MRN: [id number]   60 yo with history of CAD s/p multiple PCI, ischemic cardiomyopathy (EF 15% 10/16), VT with Medtronic ICD, smoking, h/o TIA who was admitted with low output CHF.  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   LHC/RHC (9/17): 85% ostial D2, 99% ostial OM1 (no change from prior) with patent LAD, LCX, OM2, and RCA stents; mean RA 8, PA 58/27 mean 39, mean PCWP 29, CI 2.25, PVR 2.1.   Echo (10/16) with EF 15%, mild to moderate AI, moderate MR, mildly decreased RV systolic function.   He had PICC line placed, initial co-ox was 31% and he was started on milrinone 0.125.    Todays CO-OX is 61% on milrinone 0.25 mcg.    Denies SOB.    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   Scheduled Meds: . aspirin EC  81 mg Oral Daily  . atorvastatin  40 mg Oral Daily  .  ceFAZolin (ANCEF) IV  2 g Intravenous On Call  . clopidogrel  75 mg Oral Daily  . furosemide  40 mg Intravenous BID  . gentamicin irrigation  80 mg Irrigation On Call  . lisinopril  2.5 mg Oral BID  . LORazepam  1 mg Oral Once  . sodium chloride flush  3 mL Intravenous Q12H  . spironolactone  25 mg Oral Daily   Continuous Infusions: . sodium chloride    . sodium chloride 50 mL/hr at 12/11/15 0623  . milrinone 0.25 mcg/kg/min (12/11/15 0350)   PRN Meds:.sodium chloride, acetaminophen, albuterol, ALPRAZolam, clotrimazole, hydrOXYzine, nitroGLYCERIN, ondansetron (ZOFRAN) IV, sodium chloride flush, sodium chloride flush, traMADol, zolpidem    Vitals:   12/10/15 2336 12/11/15 0300 12/11/15 0742 12/11/15 0908  BP: (!) 130/56 (!) 106/53 (!) 107/52 122/61  Pulse: (!) 47 (!) 37 (!) 59   Resp: 18 16    Temp: 98 F (36.7 C) 97.7 F (36.5 C) 97.7 F (36.5 C)   TempSrc: Oral Oral Oral   SpO2: 97% 94% 94%   Weight:  194 lb 8 oz (88.2 kg)    Height:        Intake/Output Summary (Last 24 hours) at 12/11/15 1116 Last data filed at  12/11/15 0714  Gross per 24 hour  Intake              476 ml  Output             1600 ml  Net            -1124 ml    LABS: Basic Metabolic Panel:  Recent Labs  40/98/11 0600 12/11/15 0541  NA 136 132*  K 3.8 3.9  CL 101 96*  CO2 28 28  GLUCOSE 102* 102*  BUN 25* 27*  CREATININE 1.18 1.29*  CALCIUM 9.0 9.1   Liver Function Tests: No results for input(s): AST, ALT, ALKPHOS, BILITOT, PROT, ALBUMIN in the last 72 hours. No results for input(s): LIPASE, AMYLASE in the last 72 hours. CBC:  Recent Labs  12/10/15 0600  WBC 4.8  HGB 13.0  HCT 39.1  MCV 90.9  PLT 221   Cardiac Enzymes: No results for input(s): CKTOTAL, CKMB, CKMBINDEX, TROPONINI in the last 72 hours. BNP: Invalid input(s): POCBNP D-Dimer: No results for input(s): DDIMER in the last 72 hours. Hemoglobin A1C: No results for input(s): HGBA1C in the last 72 hours. Fasting Lipid Panel:  No results for input(s): CHOL, HDL, LDLCALC, TRIG, CHOLHDL, LDLDIRECT in the last 72 hours. Thyroid Function Tests: No results for input(s): TSH, T4TOTAL, T3FREE, THYROIDAB in the last 72 hours.  Invalid input(s): FREET3 Anemia Panel: No results for input(s): VITAMINB12, FOLATE, FERRITIN, TIBC, IRON, RETICCTPCT in the last 72 hours.  RADIOLOGY: Dg Chest 2 View  Result Date: 12/06/2015 CLINICAL DATA:  Chest pressure, shortness breath, low heart rate. EXAM: CHEST  2 VIEW COMPARISON:  10/25/2015 FINDINGS: Cardiomegaly. Left AICD remains in place, unchanged. Biapical scarring. Lungs otherwise clear. No effusions, edema or acute bony abnormality. IMPRESSION: Cardiomegaly.  No active disease. Electronically Signed   By: Charlett NoseKevin  Dover M.D.   On: 12/06/2015 16:32    PHYSICAL EXAM CVP ~4  General: NAD Neck: JVP flat  no thyromegaly or thyroid nodule.  Lungs: Clear to auscultation bilaterally with normal respiratory effort. CV: Lateral PMI.  Heart irregular S1/S2, no S3/S4, 3/6 HSM apex.  No edema.    Abdomen: Soft, nontender,  no hepatosplenomegaly, no distention.  Neurologic: Alert and oriented x 3.  Psych: Normal affect. Extremities: No clubbing or cyanosis.   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  1. Acute on chronic systolic CHF: With low cardiac output noted.  Ischemic cardiomyopathy, has Medtronic ICD.  Co-ox markedly low at admission, 31%.  Co-ox 61% on milrinone 0.25.  Plan device today.  CVP 4.  - Continue milrinone 0.25 for now until after device placed, then will try to wean.  CO-OX 61%. Renal function stable.   - Hold diuretic today. Will need to restart po eventually.  - Continue lisinopril 2.5 mg bid.  - Continue spironolactone to 25 mg daily.  - Add digoxin after patient has device upgrade.  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  Suspect contribution to HF with loss of AV synchrony (and also has been bradycardic).  He has long RBBB, 172 msec QRS.  Per Dr Graciela HusbandsKlein, will place A and RV leads.  If QRS narrows, RV lead will be all he gets.  If QRS does not narrow, will place His bundle lead. - Repeat echo (last was 1 year ago).  - Concerned that he may be nearing the need for advanced therapies.  Would not be transplant candidate at this time with active smoking.  LVAD would be an option.  Has 3 children and lives with sister.  Will follow his clinical trajectory closely. Hopefully can titrate off milrinone with device upgrade and aggressive medical management.  2. CAD: Stable, no chest pain. Recent LHC in 9/17 with stable coronary disease.  - Continue ASA 81, Plavix, and statin.  3. Smoking: Encouraged him to quit.  4. 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  Suspect  contribution to HF with loss of AV synchrony (and also has been bradycardic).  He has long RBBB, 172 msec QRS.  Per Dr Graciela HusbandsKlein, will place A and RV leads.  If QRS narrows, RV lead will be all he gets.  If QRS does not narrow, will place His bundle lead.  Amy Clegg NP-C  12/11/2015 11:16 AM   Patient  seen with NP, agree with the above note.  He will go for device today.  Can stop Lasix with CVP 4.  Continue currently milrinone, will start wean post-device likely tomorrow.    Marca Ancona 12/11/2015 1:16 PM

## 2015-12-11 NOTE — Progress Notes (Signed)
Patient ID: [name] R Pelissier, male   DOB: 10/31/1955, 60 y.o.   MRN: [id number] Patient did not show up for 12/11/15, 11:30 AM, appointment to have a 24 hour holter monitor applied.

## 2015-12-11 NOTE — Care Management Note (Signed)
Case Management Note  Patient Details  Name: [name] MRN: [id number] Date of Birth: 03/01/1955  Subjective/Objective:     Pt presented for Acute on Chronic CHF. Pt is without insurance and has PCP- MD Rosana FretPhilip Jerome Karam. Pt initiated on IV Milrinone. Possible Milrinone for home.  Financial Counselor is following the patient.                 Action/Plan: CM did speak with pt in regards to home care and charity care via Agency. [facility]. CM will continue to monitor to see if pt will need HHRN for Milrione at home.   Expected Discharge Date:                  Expected Discharge Plan:  Home w Home Health Services  In-House Referral:  Financial Counselor  Discharge planning Services  CM Consult  Post Acute Care Choice:  Home Health, Durable Medical Equipment Choice offered to:  Patient  DME Arranged:  IV pump/equipment DME Agency:  [facility]  [facility]  [facility]  [facility]  Status of Service:  In process, will continue to follow  If discussed at Long Length of Stay Meetings, dates discussed:  12-12-15  Additional Comments:  Gala LewandowskyGraves-Bigelow, Anniah Glick Kaye, RN 12/11/2015, 1:06 PM

## 2015-12-11 NOTE — Progress Notes (Signed)
Orthopedic Tech Progress Note Patient Details:  [name] 07/06/1955 [id number] Patient already has sling. Patient ID: [name], male   DOB: 09/07/1955, 60 y.o.   MRN: [id number]   [name] 12/11/2015, 7:42 PM

## 2015-12-11 NOTE — Progress Notes (Signed)
Heart Failure Navigator Consult Note  Presentation: [name] is a 60 y.o. year old male with a history of MDT ICD for ICM w/ EF 15% by echo 11/2014, S-CHF, HTN, HLD, tob use.  Pt had been doing well until a couple of days ago. He woke up on Wednesday with fatigue, DOE with any activity and sometimes at rest. No edema, orthopnea or PND. He has not had any med changes, dietary changes and has been compliant with current meds.   He has not had chest pain. The SOB did not get better over the last 2 days and activity did not change the symptoms any. He was able to mow the yard, but has been extremely tired and was SOB the whole time. He has not had chest pain.     Past Medical History:  Diagnosis Date  . Anxiety   . Automatic implantable cardioverter-defibrillator in situ    a. Medtronic ICD.  [name]. CAD (coronary artery disease)    a.  Severe LAD stenosis 2/2 acute thrombus - BMS 2009;  b. 06/01/11 Cath - LAD 20 isr, 5424m (3.0x16 Veri-flex BMS), OM1 100p, EF 20-25%;  c. 07/2012 Abnl Cardiolite;  d. 08/2012 Cath/PCI: LM nl, LAD patent stents, D2 80-90 (jailed), LCX 90 p/m (4.0x24 Promus Premier DES), OM1 100, OM2 80-90p (3.0x20 Promus Premier DES), RCA patent mid stent, 40d, EF 25%.  e. 10/29/15 LHC stable disease with patent stents  . Chronic systolic CHF (congestive heart failure), NYHA class 3 (HCC) - low output state 12/12/2014  . Depression   . Dyspnea   . History of rheumatic fever 1962  . HLD (hyperlipidemia) mixed  . HTN (hypertension)   . Ischemic cardiomyopathy    a.  EF 30-35% 2010;  b.  EF 20-25% by LV gram 08/2012. c. EF 15% by echo 11/2014.  [name]. Myocardial infarction 1998; 2002; ~ 2010  . Paroxysmal ventricular tachycardia (HCC)    a. VFlutter  CL 210 msec  Rx shock 04/2011  . PVC's (premature ventricular contractions)   . RBBB   . TIA (transient ischemic attack)    a. Dx 11/2014, continued on ASA/Plavix.    Social History   Social History  . Marital status: Married    Spouse  name: N/A  . Number of children: N/A  . Years of education: N/A   Social History Main Topics  . Smoking status: Former Smoker    Packs/day: 0.50    Years: 44.00    Types: Cigarettes    Quit date: 11/20/2015  . Smokeless tobacco: Never Used  . Alcohol use No     Comment: 08/29/2012 "6-8 beers once/wk". Has not had alcohol in the last 6-8 months  . Drug use: No  . Sexual activity: Not Currently   Other Topics Concern  . None   Social History Narrative   Married; full time.     ECHO:Study Conclusions--12/10/15  - Left ventricle: The cavity size was severely dilated. There was   moderate concentric hypertrophy. Systolic function was severely   reduced. The estimated ejection fraction was 15%. There is   akinesis of the mid-apicallateral and inferoseptal myocardium.   There is akinesis of the apical myocardium. There is akinesis of   the mid-apicalanterior, inferolateral, and inferior myocardium.   The study is not technically sufficient to allow evaluation of LV   diastolic function. - Ventricular septum: Septal motion showed moderate paradox. These   changes are consistent with right ventricular pacing. - Aortic valve: There was  mild regurgitation. - Aorta: Aortic root dimension: 38 mm (ED). - Aortic root: The aortic root was mildly dilated. - Mitral valve: There was mild to moderate regurgitation. - Left atrium: The atrium was moderately dilated. - Right ventricle: The cavity size was mildly dilated. Wall   thickness was normal. - Pulmonic valve: There was trivial regurgitation. - Pulmonary arteries: PA peak pressure: 53 mm Hg (S).  Impressions:  - The right ventricular systolic pressure was increased consistent   with moderate pulmonary hypertension.   BNP    Component Value Date/Time   BNP 1,701.1 (H) 12/06/2015 1537   BNP 324.1 (H) 07/09/2015 1308    ProBNP No results found for: PROBNP   Education Assessment and Provision:  Detailed education and  instructions provided on heart failure disease management including the following:  Signs and symptoms of Heart Failure When to call the physician Importance of daily weights Low sodium diet Fluid restriction Medication management Anticipated future follow-up appointments  Patient education given on each of the above topics.  Patient acknowledges understanding and acceptance of all instructions.  I spoke with [name] regarding his HF and current admission.  He has a scale at home and can teach back regarding when to call.  We reviewed briefly a low sodium diet and high sodium foods to avoid. He denies any previous issues with getting or taking prescribed medications however does admit that he is concerned about costs if he goes home on Milrinone--due to no current insurance.  He does say that he has a pending Medicaid application.  He tells me that his goal is to feel well enough to do some riding (motorcycle).    Education Materials:  "Living Better With Heart Failure" Booklet, Daily Weight Tracker Tool   High Risk Criteria for Readmission and/or Poor Patient Outcomes:  (Recommend Follow-up with [facility])--yes   EF <30%- 15%  2 or more admissions in 6 months- yes 3/71mo  Difficult social situation- No --however does not currently have insurance  Demonstrates medication noncompliance- Denies    Barriers of Care:  Knowledge and compliance Discharge Planning:   Plans to return home to [name] with his sister.

## 2015-12-12 ENCOUNTER — Encounter (HOSPITAL_COMMUNITY): Payer: Self-pay | Admitting: Internal Medicine

## 2015-12-12 ENCOUNTER — Inpatient Hospital Stay (HOSPITAL_COMMUNITY): Payer: Medicaid Other

## 2015-12-12 LAB — BASIC METABOLIC PANEL
ANION GAP: 7 (ref 5–15)
BUN: 19 mg/dL (ref 6–20)
CALCIUM: 8.8 mg/dL — AB (ref 8.9–10.3)
CO2: 24 mmol/L (ref 22–32)
Chloride: 101 mmol/L (ref 101–111)
Creatinine, Ser: 0.98 mg/dL (ref 0.61–1.24)
GLUCOSE: 130 mg/dL — AB (ref 65–99)
Potassium: 4.5 mmol/L (ref 3.5–5.1)
SODIUM: 132 mmol/L — AB (ref 135–145)

## 2015-12-12 LAB — COOXEMETRY PANEL
CARBOXYHEMOGLOBIN: 1.2 % (ref 0.5–1.5)
METHEMOGLOBIN: 0.9 % (ref 0.0–1.5)
O2 SAT: 67.6 %
TOTAL HEMOGLOBIN: 12.9 g/dL (ref 12.0–16.0)

## 2015-12-12 MED ORDER — OXYCODONE-ACETAMINOPHEN 5-325 MG PO TABS
1.0000 | ORAL_TABLET | Freq: Four times a day (QID) | ORAL | Status: DC | PRN
  Administered 2015-12-12 – 2015-12-13 (×4): 1 via ORAL
  Filled 2015-12-12 (×4): qty 1

## 2015-12-12 MED ORDER — LOSARTAN POTASSIUM 25 MG PO TABS
12.5000 mg | ORAL_TABLET | Freq: Two times a day (BID) | ORAL | Status: DC
  Administered 2015-12-12 – 2015-12-13 (×2): 12.5 mg via ORAL
  Filled 2015-12-12 (×2): qty 1

## 2015-12-12 MED ORDER — DIGOXIN 125 MCG PO TABS
0.1250 mg | ORAL_TABLET | Freq: Every day | ORAL | Status: DC
  Administered 2015-12-12 – 2015-12-14 (×3): 0.125 mg via ORAL
  Filled 2015-12-12 (×3): qty 1

## 2015-12-12 NOTE — Progress Notes (Signed)
Patient ID: [name], male   DOB: [birth year], 60 y.o.   MRN: [id number]   60 yo with history of CAD s/p multiple PCI, ischemic cardiomyopathy (EF 15% 10/16), VT with Medtronic ICD, smoking, h/o TIA who was admitted with low output CHF.  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   LHC/RHC (9/17): 85% ostial D2, 99% ostial OM1 (no change from prior) with patent LAD, LCX, OM2, and RCA stents; mean RA 8, PA 58/27 mean 39, mean PCWP 29, CI 2.25, PVR 2.1.   Echo (10/16) with EF 15%, mild to moderate AI, moderate MR, mildly decreased RV systolic function.   He had PICC line placed, initial co-ox was 31% and he was started on milrinone.     10/18 device upgrade to BiV ICD.  He is a-sensed, v-paced this morning.   Today's CO-OX is 67% on milrinone 0.25 mcg, CVP 1.    Denies SOB.    Scheduled Meds: . aspirin EC  81 mg Oral Daily  . atorvastatin  40 mg Oral Daily  .  ceFAZolin (ANCEF) IV  1 g Intravenous Q6H  . clopidogrel  75 mg Oral Daily  . digoxin  0.125 mg Oral Daily  . lisinopril  2.5 mg Oral BID  . LORazepam  1 mg Oral Once  . sodium chloride flush  3 mL Intravenous Q12H  . spironolactone  25 mg Oral Daily   Continuous Infusions: . milrinone 0.25 mcg/kg/min (12/11/15 2011)   PRN Meds:.sodium chloride, acetaminophen, albuterol, ALPRAZolam, clotrimazole, hydrOXYzine, nitroGLYCERIN, ondansetron (ZOFRAN) IV, oxyCODONE-acetaminophen, sodium chloride flush, sodium chloride flush, traMADol, zolpidem    Vitals:   12/12/15 0430 12/12/15 0436 12/12/15 0759 12/12/15 0925  BP:  124/63 (!) 112/47 (!) 122/48  Pulse:  78 74 75  Resp:      Temp: 97.9 F (36.6 C)  97.3 F (36.3 C) 97.9 F (36.6 C)  TempSrc: Oral  Oral Oral  SpO2:  96% 97% 96%  Weight:  196 lb 8 oz (89.1 kg)    Height:        Intake/Output Summary (Last 24 hours) at 12/12/15 0939 Last data filed at 12/12/15 0851  Gross per 24 hour  Intake           975.63 ml  Output             2830 ml    Net         -1854.37 ml    LABS: Basic Metabolic Panel:  Recent Labs  32/44/01 0600 12/11/15 0541  NA 136 132*  K 3.8 3.9  CL 101 96*  CO2 28 28  GLUCOSE 102* 102*  BUN 25* 27*  CREATININE 1.18 1.29*  CALCIUM 9.0 9.1   Liver Function Tests: No results for input(s): AST, ALT, ALKPHOS, BILITOT, PROT, ALBUMIN in the last 72 hours. No results for input(s): LIPASE, AMYLASE in the last 72 hours. CBC:  Recent Labs  12/10/15 0600  WBC 4.8  HGB 13.0  HCT 39.1  MCV 90.9  PLT 221   Cardiac Enzymes: No results for input(s): CKTOTAL, CKMB, CKMBINDEX, TROPONINI in the last 72 hours. BNP: Invalid input(s): POCBNP D-Dimer: No results for input(s): DDIMER in the last 72 hours. Hemoglobin A1C: No results for input(s): HGBA1C in the last 72 hours. Fasting Lipid Panel: No results for input(s): CHOL, HDL, LDLCALC, TRIG, CHOLHDL, LDLDIRECT in the last 72 hours. Thyroid Function Tests: No results for input(s): TSH, T4TOTAL, T3FREE, THYROIDAB in  the last 72 hours.  Invalid input(s): FREET3 Anemia Panel: No results for input(s): VITAMINB12, FOLATE, FERRITIN, TIBC, IRON, RETICCTPCT in the last 72 hours.  RADIOLOGY: Dg Chest 2 View  Result Date: 12/12/2015 CLINICAL DATA:  Pacemaker. EXAM: CHEST  2 VIEW COMPARISON:  12/06/2015 . FINDINGS: AICD in good anatomic position. Right PICC line noted with tip projected over the superior vena cava. Stable cardiomegaly. Low lung volumes. No pleural effusion or pneumothorax. Biapical pleural thickening noted consistent scarring. IMPRESSION: 1.  PICC line noted with tip projected over superior vena cava. 2. AICD noted in good anatomic position. Stable cardiomegaly. Low lung volumes. Electronically Signed   By: Maisie Fushomas  Register   On: 12/12/2015 07:53   Dg Chest 2 View  Result Date: 12/06/2015 CLINICAL DATA:  Chest pressure, shortness breath, low heart rate. EXAM: CHEST  2 VIEW COMPARISON:  10/25/2015 FINDINGS: Cardiomegaly. Left AICD remains in  place, unchanged. Biapical scarring. Lungs otherwise clear. No effusions, edema or acute bony abnormality. IMPRESSION: Cardiomegaly.  No active disease. Electronically Signed   By: Charlett NoseKevin  Dover M.D.   On: 12/06/2015 16:32    PHYSICAL EXAM CVP ~1  General: NAD Neck: JVP flat  no thyromegaly or thyroid nodule.  Lungs: Clear to auscultation bilaterally with normal respiratory effort. CV: Lateral PMI.  Heart irregular S1/S2, no S3/S4, 3/6 HSM apex.  No edema.    Abdomen: Soft, nontender, no hepatosplenomegaly, no distention.  Neurologic: Alert and oriented x 3.  Psych: Normal affect. Extremities: No clubbing or cyanosis.   TELEMETRY:   ASSESSMENT AND PLAN: 60 yo with history of CAD s/p multiple PCI, ischemic cardiomyopathy (EF 15% 10/16), VT with Medtronic ICD, smoking, h/o TIA who was admitted with low output CHF.  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  1. Acute on chronic systolic CHF: With low cardiac output noted.  Ischemic cardiomyopathy, had upgrade to Medtronic BiV ICD this admission.  Co-ox markedly low at admission, 31%.  Co-ox now 68% on milrinone 0.25.   - Cut back milrinone 0.125 mcg/kg/min now, will try to stop later today after repeat co-ox.  - Off diuretic today with CVP 1.  - Stop lisinopril. Start losartan 12.5 mg twice a day with 1st dose tonight anticipating Entresto use in the future. Renal function stable.   - Continue spironolactone to 25 mg daily.  - Add digoxin 0.125 mcg.  - Eventually would like him on low dose Coreg.  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  Suspect contribution to HF with loss of AV synchrony (and also has been bradycardic).  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  Now s/p upgrade to BiV ICD and a-sensed/v-paced.  Hopefully this will help.  - Concerned that he may be nearing the need for advanced therapies.  Would not be transplant candidate at this time with active smoking.  LVAD would be an option.   Has 3 children and lives with sister.  Will follow his clinical trajectory closely. Hopefully can titrate off milrinone with device upgrade and aggressive medical management.  2. CAD: Stable, no chest pain. Recent LHC in 9/17 with stable coronary disease.  - Continue ASA 81, Plavix, and statin.  3. Smoking: Encouraged him to quit.  4. 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  Suspect contribution to HF with loss of AV synchrony (and also has been bradycardic).  He had long  RBBB, 172 msec QRS.  He is now s/p BiV upgrade and is a-sensed/v-paced.   Amy Clegg NP-C  12/12/2015 9:39 AM   Patient seen with NP, agree with the above note.  He is doing well.  CVP 1 and co-ox 67%.  Off Lasix for now.   - Decrease milrinone to 0.125 and repeat co-ox in pm, hopefully can stop. - Add digoxin.  - transition to losartan in anticipation of eventual Entresto.   Now s/p BiV upgrade, a-sensed/v-paced.  Hopefully this will help his symptoms   Marca Ancona 12/12/2015 9:57 AM   Now

## 2015-12-12 NOTE — Op Note (Signed)
NAMBascom [name]:  [name], [name]                 ACCOUNT NO.:  [id number]  MEDICAL RECORD NO.:  [id number]  LOCATION:  [id number]                        FACILITY:  [facility]  PHYSICIAN:  Duke SalviaSteven C. Klein, MD, FACCDATE OF BIRTH:  [birth year]  DATE OF PROCEDURE:  12/11/2015 DATE OF DISCHARGE:                              OPERATIVE REPORT   PREOPERATIVE DIAGNOSES:  Ischemic cardiomyopathy with intermittent complete heart block, right bundle-branch block and congestive heart failure.  POSTOPERATIVE DIAGNOSES:  Ischemic cardiomyopathy with intermittent complete heart block, right bundle-branch block and congestive heart failure.  PROCEDURES:  Explantation of a previously-implanted device repair of a single ventricular lead, insertion of an atrial lead, insertion of a left ventricular lead with failed His bundle pacing attempt.  Contrast venogram.  DESCRIPTION OF PROCEDURE:  Following obtaining informed consent, the patient was brought to the electrophysiology laboratory and placed on the fluoroscopic table in supine position.  After routine prep and drape, a venogram demonstrated the patency of the extrathoracic left subclavian vein and after local anesthesia was administered, incision was carried down to the layer of the prepectoral fascia.  Venous access was obtained without difficulty.  A single venipuncture was accomplished at this juncture and an atrial lead was deployed.  This was a Medtronic [id number], 52-cm lead, [id number] and this was deployed to the right atrial appendage where the bipolar P-wave was 5 with a pace impedance of 678, a threshold of 1.3 V at 0.5 milliseconds.  This lead having been secured to the prepectoral fascia.  We then attempted His bundle pacing.  We have reobtained left subclavian venous access.  We then spent about 30-40 minutes mapping to His bundle.  We were not able to determine a regular His electrogram.  We were able to capture a parahisian, but the QRS widened modestly  from 160-170/180 milliseconds.  Ultimately, it was elected to abandon this and the coronary sinus was cannulated.  A nonocclusive venogram demonstrated a midlateral branch, which was then targeted and a [employer] 1458 lead, serial number [id number] was deployed to a junction between the mid and distal third.  3-4 poles were utilized and the threshold of 1.25 V at 0.4 milliseconds.  QLV at this juncture was about 78-80 milliseconds.  RV pacing to LV sense was 120 milliseconds.  This lead was secured following removal of the CS cannulation system.  The pocket was expanded to allow for housing of the larger device.  The pocket was copiously irrigated with antibiotic-containing saline solution.  The leads were then attached to the pulse generator; Medtronic Claria, serial number [id number]. Through the device, the thresholds were 0.5 at 0.4 in the A, 1.25 at 0.4 in the RV and LV.  RA sensitivity was 1.5, RV sensitivity was low at 1.9, this was chronic.  RA impedance was 494, RV impedance was 399 and LV impedance was 665.  High-voltage impedances were 55/43.  The pocket was copiously irrigated with antibiotic-containing saline solution. Hemostasis was assured.  The leads and the pulse generator were placed in the pocket, secured to the prepectoral fascia and Surgicel was placed along the superior aspect of the pocket.  The wound was then closed in 2 layers in normal  fashion.  The wound was washed, dried and a benzoin, Steri-Strip dressing were applied.  Needle counts, sponge counts and instrument counts were correct at the end of the procedure according to the staff. The patient tolerated the procedure without apparent complication.     [name], MD, [facility]     SCK/MEDQ  D:  12/11/2015  T:  12/12/2015  Job:  [id number]

## 2015-12-12 NOTE — Discharge Instructions (Signed)
° ° °  Supplemental Discharge Instructions for  °Pacemaker/Defibrillator Patients ° °Activity °No heavy lifting or vigorous activity with your left/right arm for 6 to 8 weeks.  Do not raise your left/right arm above your head for one week.  Gradually raise your affected arm as drawn below. ° °        ° °__        12/16/15                12/17/15                 12/18/15                12/19/15 ° °NO DRIVING for  1 week   ; you may begin driving on   12/19/15  . ° °WOUND CARE °- Keep the wound area clean and dry.    °- No bandage is needed on the site.  DO  NOT apply any creams, oils, or ointments to the wound area. °- If you notice any drainage or discharge from the wound, any swelling or bruising at the site, or you develop a fever > 101? F after you are discharged home, call the office at once. ° °Special Instructions °- You are still able to use cellular telephones; use the ear opposite the side where you have your pacemaker/defibrillator.  Avoid carrying your cellular phone near your device. °- When traveling through airports, show security personnel your identification card to avoid being screened in the metal detectors.  Ask the security personnel to use the hand wand. °- Avoid arc welding equipment, MRI testing (magnetic resonance imaging), TENS units (transcutaneous nerve stimulators).  Call the office for questions about other devices. °- Avoid electrical appliances that are in poor condition or are not properly grounded. °- Microwave ovens are safe to be near or to operate. ° °Additional information for defibrillator patients should your device go off: °- If your device goes off ONCE and you feel fine afterward, notify the device clinic nurses. °- If your device goes off ONCE and you do not feel well afterward, call 911. °- If your device goes off TWICE, call 911. °- If your device goes off THREE times in one day, call 911. ° °DO NOT DRIVE YOURSELF OR A FAMILY MEMBER °WITH A DEFIBRILLATOR TO THE  HOSPITAL--CALL 911. ° °

## 2015-12-12 NOTE — Progress Notes (Signed)
[facility]  New pt for [facility] this admission. [facility] requested to see pt for possible home Milrinone.  Met with pt and discussed possible need for home Milrinone.  Pt concerned about not having insurance and cost of home milrinone. I spoke with Henrietta Dine Financial Counselor, who states he spoke with [place] and though pt states he has applied for MCD, there is no record of application on file in [facility].  Adam shared that pt would have a MCD spend down.  Per Dr. Claris Gladden note form 12-11-15, attempting to start wean from Milrinone. [facility] will continue to follow Mr. Kindt and work with hospital and [facility] team on DC disposition.   If patient discharges after hours, please call [phone].   Larry Sierras 12/12/2015, 7:26 AM

## 2015-12-13 DIAGNOSIS — R001 Bradycardia, unspecified: Secondary | ICD-10-CM

## 2015-12-13 LAB — BASIC METABOLIC PANEL
Anion gap: 3 — ABNORMAL LOW (ref 5–15)
BUN: 15 mg/dL (ref 6–20)
CHLORIDE: 107 mmol/L (ref 101–111)
CO2: 25 mmol/L (ref 22–32)
CREATININE: 0.98 mg/dL (ref 0.61–1.24)
Calcium: 9 mg/dL (ref 8.9–10.3)
GFR calc Af Amer: >60 mL/min (ref >60)
GFR calc non Af Amer: >60 mL/min (ref >60)
GLUCOSE: 102 mg/dL — AB (ref 65–99)
POTASSIUM: 4.6 mmol/L (ref 3.5–5.1)
Sodium: 135 mmol/L (ref 135–145)

## 2015-12-13 LAB — COOXEMETRY PANEL
Carboxyhemoglobin: 1.1 % (ref 0.5–1.5)
Methemoglobin: 0.7 % (ref 0.0–1.5)
O2 SAT: 63.9 %
TOTAL HEMOGLOBIN: 13.8 g/dL (ref 12.0–16.0)

## 2015-12-13 MED ORDER — LOSARTAN POTASSIUM 25 MG PO TABS
25.0000 mg | ORAL_TABLET | Freq: Two times a day (BID) | ORAL | Status: DC
  Administered 2015-12-13 – 2015-12-14 (×2): 25 mg via ORAL
  Filled 2015-12-13 (×2): qty 1

## 2015-12-13 MED ORDER — FUROSEMIDE 40 MG PO TABS
40.0000 mg | ORAL_TABLET | Freq: Every day | ORAL | Status: DC
  Administered 2015-12-14: 40 mg via ORAL
  Filled 2015-12-13: qty 1

## 2015-12-13 NOTE — Progress Notes (Signed)
Patient ID: [name], male   DOB: 02/28/1955, 60 y.o.   MRN: [id number]   60 yo with history of CAD s/p multiple PCI, ischemic cardiomyopathy (EF 15% 10/16), VT with Medtronic ICD, smoking, h/o TIA who was admitted with low output CHF.  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   LHC/RHC (9/17): 85% ostial D2, 99% ostial OM1 (no change from prior) with patent LAD, LCX, OM2, and RCA stents; mean RA 8, PA 58/27 mean 39, mean PCWP 29, CI 2.25, PVR 2.1.   Echo (10/16) with EF 15%, mild to moderate AI, moderate MR, mildly decreased RV systolic function.   He had PICC line placed, initial co-ox was 31% and he was started on milrinone.     10/18 device upgrade to BiV ICD.  He is a-sensed, v-paced this morning. Had a couple short NSVT runs (4 beats).    Today's CO-OX is 64% on milrinone 0.125 mcg, CVP 6.   Feels great, much improved.   Denies SOB.    Scheduled Meds: . aspirin EC  81 mg Oral Daily  . atorvastatin  40 mg Oral Daily  . clopidogrel  75 mg Oral Daily  . digoxin  0.125 mg Oral Daily  . [START ON 12/14/2015] furosemide  40 mg Oral Daily  . LORazepam  1 mg Oral Once  . losartan  25 mg Oral BID  . sodium chloride flush  3 mL Intravenous Q12H  . spironolactone  25 mg Oral Daily   Continuous Infusions: . milrinone 0.125 mcg/kg/min (12/12/15 1237)   PRN Meds:.sodium chloride, acetaminophen, albuterol, ALPRAZolam, clotrimazole, hydrOXYzine, nitroGLYCERIN, ondansetron (ZOFRAN) IV, oxyCODONE-acetaminophen, sodium chloride flush, sodium chloride flush, traMADol, zolpidem    Vitals:   12/12/15 2001 12/12/15 2353 12/13/15 0433 12/13/15 0806  BP: (!) 143/55 (!) 122/54 (!) 132/58 115/71  Pulse: 86 82 81 83  Resp: 18 17 18    Temp: 98.2 F (36.8 C) 98.1 F (36.7 C) 97.4 F (36.3 C) 98.3 F (36.8 C)  TempSrc: Oral Oral Oral Oral  SpO2: 100% 98% 98% 97%  Weight:   194 lb 8 oz (88.2 kg)   Height:        Intake/Output Summary (Last 24 hours) at  12/13/15 1057 Last data filed at 12/13/15 0800  Gross per 24 hour  Intake              720 ml  Output             3070 ml  Net            -2350 ml    LABS: Basic Metabolic Panel:  Recent Labs  95/62/1310/19/17 1125 12/13/15 0500  NA 132* 135  K 4.5 4.6  CL 101 107  CO2 24 25  GLUCOSE 130* 102*  BUN 19 15  CREATININE 0.98 0.98  CALCIUM 8.8* 9.0   Liver Function Tests: No results for input(s): AST, ALT, ALKPHOS, BILITOT, PROT, ALBUMIN in the last 72 hours. No results for input(s): LIPASE, AMYLASE in the last 72 hours. CBC: No results for input(s): WBC, NEUTROABS, HGB, HCT, MCV, PLT in the last 72 hours. Cardiac Enzymes: No results for input(s): CKTOTAL, CKMB, CKMBINDEX, TROPONINI in the last 72 hours. BNP: Invalid input(s): POCBNP D-Dimer: No results for input(s): DDIMER in the last 72 hours. Hemoglobin A1C: No results for input(s): HGBA1C in the last 72 hours. Fasting Lipid Panel: No results for input(s): CHOL, HDL, LDLCALC, TRIG, CHOLHDL, LDLDIRECT in the  last 72 hours. Thyroid Function Tests: No results for input(s): TSH, T4TOTAL, T3FREE, THYROIDAB in the last 72 hours.  Invalid input(s): FREET3 Anemia Panel: No results for input(s): VITAMINB12, FOLATE, FERRITIN, TIBC, IRON, RETICCTPCT in the last 72 hours.  RADIOLOGY: Dg Chest 2 View  Result Date: 12/12/2015 CLINICAL DATA:  Pacemaker. EXAM: CHEST  2 VIEW COMPARISON:  12/06/2015 . FINDINGS: AICD in good anatomic position. Right PICC line noted with tip projected over the superior vena cava. Stable cardiomegaly. Low lung volumes. No pleural effusion or pneumothorax. Biapical pleural thickening noted consistent scarring. IMPRESSION: 1.  PICC line noted with tip projected over superior vena cava. 2. AICD noted in good anatomic position. Stable cardiomegaly. Low lung volumes. Electronically Signed   By: Maisie Fus  Register   On: 12/12/2015 07:53   Dg Chest 2 View  Result Date: 12/06/2015 CLINICAL DATA:  Chest pressure,  shortness breath, low heart rate. EXAM: CHEST  2 VIEW COMPARISON:  10/25/2015 FINDINGS: Cardiomegaly. Left AICD remains in place, unchanged. Biapical scarring. Lungs otherwise clear. No effusions, edema or acute bony abnormality. IMPRESSION: Cardiomegaly.  No active disease. Electronically Signed   By: Charlett Nose M.D.   On: 12/06/2015 16:32    PHYSICAL EXAM CVP ~1  General: NAD Neck: JVP flat  no thyromegaly or thyroid nodule.  Lungs: Clear to auscultation bilaterally with normal respiratory effort. CV: Lateral PMI.  Heart irregular S1/S2, no S3/S4, 3/6 HSM apex.  No edema.    Abdomen: Soft, nontender, no hepatosplenomegaly, no distention.  Neurologic: Alert and oriented x 3.  Psych: Normal affect. Extremities: No clubbing or cyanosis.   TELEMETRY: NSR, BiV pacing.  Short runs NSVT noted.   ASSESSMENT AND PLAN: 60 yo with history of CAD s/p multiple PCI, ischemic cardiomyopathy (EF 15% 10/16), VT with Medtronic ICD, smoking, h/o TIA who was admitted with low output CHF.  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  1. Acute on chronic systolic CHF: With low cardiac output noted.  Ischemic cardiomyopathy, had upgrade to Medtronic BiV ICD this admission.  Co-ox markedly low at admission, 31%.  Co-ox now 64% after decreasing milrinone from 0.25 => 0.125 yesterday.  CVP 6.   - Stop milrinone today.  Continue digoxin.    - Restart Lasix 40 mg daily tomorrow.  - Increase losartan to 25 mg bid.  Anticipate Entresto use in the future. Renal function stable.   - Continue spironolactone to 25 mg daily. .  - Eventually would like him on low dose Coreg, will not do this until we know we can get him off milrinone.  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  Suspect contribution to HF with loss of AV synchrony (and also has been bradycardic).  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  Now s/p upgrade to BiV ICD and a-sensed/v-paced.  Hopefully this will help.  -  Concerned that he may be nearing the need for advanced therapies.  Would not be transplant candidate at this time with active smoking.  LVAD would be an option.  Has 3 children and lives with sister.  Will follow his clinical trajectory closely. Hopefully can titrate off milrinone with device upgrade and aggressive medical management.  2. CAD: Stable, no chest pain. Recent LHC in 9/17 with stable coronary disease.  - Continue ASA 81, Plavix, and statin.  3. Smoking: Encouraged him to quit.  4. Rhythm: As above, noted to have type  1 2nd degree AVB and 2:1 AVB.  Suspect contribution to HF with loss of AV synchrony (and also has been bradycardic).  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  He is now s/p BiV upgrade and is a-sensed/v-paced.  5. NSVT: Short NSVT runs, suspect this will resolve with stopping milrinone.  Continue to follow on telemetry.  6. Disposition: Home tomorrow if stable off milrinone.  Will arrange followup 10 days in CHF clinic.   Marca Ancona  12/13/2015 10:57 AM

## 2015-12-14 DIAGNOSIS — E782 Mixed hyperlipidemia: Secondary | ICD-10-CM

## 2015-12-14 LAB — BASIC METABOLIC PANEL
Anion gap: 4 — ABNORMAL LOW (ref 5–15)
BUN: 15 mg/dL (ref 6–20)
CALCIUM: 8.9 mg/dL (ref 8.9–10.3)
CO2: 25 mmol/L (ref 22–32)
CREATININE: 0.87 mg/dL (ref 0.61–1.24)
Chloride: 105 mmol/L (ref 101–111)
GFR calc non Af Amer: >60 mL/min (ref >60)
Glucose, Bld: 104 mg/dL — ABNORMAL HIGH (ref 65–99)
Potassium: 4.3 mmol/L (ref 3.5–5.1)
SODIUM: 134 mmol/L — AB (ref 135–145)

## 2015-12-14 LAB — COOXEMETRY PANEL
CARBOXYHEMOGLOBIN: 1.1 % (ref 0.5–1.5)
METHEMOGLOBIN: 0.8 % (ref 0.0–1.5)
O2 SAT: 69.7 %
Total hemoglobin: 14 g/dL (ref 12.0–16.0)

## 2015-12-14 LAB — CBC
HCT: 41.6 % (ref 39.0–52.0)
Hemoglobin: 13.7 g/dL (ref 13.0–17.0)
MCH: 30.2 pg (ref 26.0–34.0)
MCHC: 32.9 g/dL (ref 30.0–36.0)
MCV: 91.6 fL (ref 78.0–100.0)
PLATELETS: 224 10*3/uL (ref 150–400)
RBC: 4.54 MIL/uL (ref 4.22–5.81)
RDW: 15.1 % (ref 11.5–15.5)
WBC: 5.2 10*3/uL (ref 4.0–10.5)

## 2015-12-14 MED ORDER — LOSARTAN POTASSIUM 25 MG PO TABS: 25.0000 mg | ORAL_TABLET | Freq: Two times a day (BID) | ORAL | 5 refills | Status: DC

## 2015-12-14 MED ORDER — DIGOXIN 125 MCG PO TABS: 0.1250 mg | ORAL_TABLET | Freq: Every day | ORAL | 5 refills | Status: DC

## 2015-12-14 MED ORDER — SPIRONOLACTONE 25 MG PO TABS: 25.0000 mg | ORAL_TABLET | Freq: Every day | ORAL | 5 refills | Status: DC

## 2015-12-14 NOTE — Discharge Summary (Signed)
Discharge Summary    Patient ID: [name],  MRN: [id number], DOB/AGE: 62/10/1955 62 y.o.  Admit date: 12/06/2015 Discharge date: 12/14/2015  Primary Care Provider: Tula NakayamaKARAM,PHILLIP JEROME Primary Cardiologist: Dr. Jens Somrenshaw Electrophysiologist: Dr. Graciela HusbandsKlein Advance HF: [name]   Discharge Diagnoses    Principal Problem:   Chronic systolic CHF (congestive heart failure), NYHA class 3 (HCC) - low output state Active Problems:   Ischemic cardiomyopathy   HLD (hyperlipidemia)   Allergies No Known Allergies  Diagnostic Studies/Procedures    PPM Implantation 12/11/15 (see op note for details)  History of Present Illness     60 yo male with history of CAD s/p multiple PCI, ischemic cardiomyopathy (EF 15% 10/16), VT with Medtronic ICD, smoking, h/o TIA who was admitted with low output CHF.  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   Hospital Course     Treated Hospital Conditions are listed below with treatment plans outlined.   1. Acute on chronic systolic CHF: With low cardiac output noted.  Ischemic cardiomyopathy, had upgrade to Medtronic BiV ICD this admission.  Co-ox markedly low at admission, 31%.  Co-ox now 64% after decreasing milrinone from 0.25 => 0.125 yesterday.  CVP 6.   - milrinone is now off.   Continue digoxin.    - on  Lasix 40 mg daily   Does not want to start Coreg.   Will let the CHF team initiate that when appropriate.  - Increase losartan to 25 mg bid.  Anticipate Entresto use in the future. Renal function stable.   - Continue spironolactone to 25 mg daily.   -    - He has been noted to have type 1 2nd degree AVB and 2:1 AVB.  Suspect contribution to HF with loss of AV synchrony (and also has been bradycardic).  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  Now s/p upgrade to BiV ICD and a-sensed/v-paced.  Hopefully this will help.  - Concerned that he may be nearing the need for advanced therapies.  Would not be transplant candidate  at this time with active smoking.  LVAD would be an option.  Has 3 children and lives with sister.  Will follow his clinical trajectory closely. Hopefully can titrate off milrinone with device upgrade and aggressive medical management.   2. CAD: Stable, no chest pain. Recent LHC in 9/17 with stable coronary disease.  - Continue ASA 81, Plavix, and statin.   3. Smoking:  He has stated that he has stopped.  4. 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  Suspect contribution to HF with loss of AV synchrony (and also has been bradycardic).  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  He is now s/p BiV upgrade and is a-sensed/v-paced.   5. NSVT: Short NSVT runs, suspect this will resolve with stopping milrinone.  Continue to follow on telemetry.   6. Disposition: Home today   Patient was last seen and examined by Dr. Elease HashimotoNahser on 12/14/15 and was determined stable for discharge home. He has f/u arranged in the AHF clinic with [name] on 12/23/15 and wound check in device clinic on 11/2.   Consultants: none    Discharge Vitals Blood pressure 112/64, pulse 80, temperature 97.7 F (36.5 C), temperature source Oral, resp. rate 16, height 5\' 11"  (1.803 m), weight 193 lb 6.4 oz (87.7 kg), SpO2 99 %.  Filed Weights   12/12/15 0436  12/13/15 0433 12/14/15 0359  Weight: 196 lb 8 oz (89.1 kg) 194 lb 8 oz (88.2 kg) 193 lb 6.4 oz (87.7 kg)    Labs & Radiologic Studies    CBC  Recent Labs  12/14/15 0511  WBC 5.2  HGB 13.7  HCT 41.6  MCV 91.6  PLT 224   Basic Metabolic Panel  Recent Labs  12/13/15 0500 12/14/15 0511  NA 135 134*  K 4.6 4.3  CL 107 105  CO2 25 25  GLUCOSE 102* 104*  BUN 15 15  CREATININE 0.98 0.87  CALCIUM 9.0 8.9   Liver Function Tests No results for input(s): AST, ALT, ALKPHOS, BILITOT, PROT, ALBUMIN in the last 72 hours. No results for input(s): LIPASE, AMYLASE in the last 72 hours. Cardiac Enzymes No results for input(s): CKTOTAL, CKMB,  CKMBINDEX, TROPONINI in the last 72 hours. BNP Invalid input(s): POCBNP D-Dimer No results for input(s): DDIMER in the last 72 hours. Hemoglobin A1C No results for input(s): HGBA1C in the last 72 hours. Fasting Lipid Panel No results for input(s): CHOL, HDL, LDLCALC, TRIG, CHOLHDL, LDLDIRECT in the last 72 hours. Thyroid Function Tests No results for input(s): TSH, T4TOTAL, T3FREE, THYROIDAB in the last 72 hours.  Invalid input(s): FREET3 _____________  Dg Chest 2 View  Result Date: 12/12/2015 CLINICAL DATA:  Pacemaker. EXAM: CHEST  2 VIEW COMPARISON:  12/06/2015 . FINDINGS: AICD in good anatomic position. Right PICC line noted with tip projected over the superior vena cava. Stable cardiomegaly. Low lung volumes. No pleural effusion or pneumothorax. Biapical pleural thickening noted consistent scarring. IMPRESSION: 1.  PICC line noted with tip projected over superior vena cava. 2. AICD noted in good anatomic position. Stable cardiomegaly. Low lung volumes. Electronically Signed   By: Maisie Fus  Register   On: 12/12/2015 07:53   Dg Chest 2 View  Result Date: 12/06/2015 CLINICAL DATA:  Chest pressure, shortness breath, low heart rate. EXAM: CHEST  2 VIEW COMPARISON:  10/25/2015 FINDINGS: Cardiomegaly. Left AICD remains in place, unchanged. Biapical scarring. Lungs otherwise clear. No effusions, edema or acute bony abnormality. IMPRESSION: Cardiomegaly.  No active disease. Electronically Signed   By: Charlett Nose M.D.   On: 12/06/2015 16:32   Disposition   Pt is being discharged home today in good condition.  Follow-up Plans & Appointments    Follow-up Information    [facility] [address] [facility] Follow up on 12/26/2015.   Specialty:  Cardiology Why:  at 10AM for wound check  Contact information: [address] [place] [address] [phone]       Gypsy Balsam, NP Follow up on 01/29/2016.   Specialty:  Cardiology Why:  at 9:40AM Contact  information: [address] [place] [address] [phone]        Sherryl Manges, MD Follow up on 03/12/2016.   Specialty:  Cardiology Why:  at 2:15PM  Contact information: [address] [place] [address] [phone]        Marca Ancona, MD Follow up on 12/23/2015.   Specialty:  Cardiology Why:  at 1030 for post hospital follow up.  Please bring all of your medications to your visit. The code for parking is 4000. Contact information: [address] [place] [address] [phone]          Discharge Instructions    Diet - low sodium heart healthy    Complete by:  As directed    Increase activity slowly  Complete by:  As directed       Discharge Medications   Current Discharge Medication List    START taking these medications   Details  digoxin (LANOXIN) 0.125 MG tablet Take 1 tablet (0.125 mg total) by mouth daily. Qty: 30 tablet, Refills: 5    losartan (COZAAR) 25 MG tablet Take 1 tablet (25 mg total) by mouth 2 (two) times daily. Qty: 60 tablet, Refills: 5    spironolactone (ALDACTONE) 25 MG tablet Take 1 tablet (25 mg total) by mouth daily. Qty: 30 tablet, Refills: 5      CONTINUE these medications which have NOT CHANGED   Details  albuterol (PROAIR HFA) 108 (90 Base) MCG/ACT inhaler Inhale 2 puffs into the lungs every 4 (four) hours as needed for wheezing or shortness of breath.    ALPRAZolam (XANAX) 0.25 MG tablet Take 1 tablet (0.25 mg total) by mouth 3 (three) times daily as needed for anxiety. Qty: 10 tablet, Refills: 0    aspirin EC 81 MG tablet Take 81 mg by mouth daily.    atorvastatin (LIPITOR) 40 MG tablet Take 1 tablet (40 mg total) by mouth daily at 6 PM. Qty: 30 tablet, Refills: 11   Associated Diagnoses: SOB (shortness of breath)    clopidogrel (PLAVIX) 75 MG tablet Take 75 mg by mouth daily.     clotrimazole-betamethasone (LOTRISONE) cream Apply 1 application topically 2 (two) times  daily. Qty: 15 g, Refills: 0    furosemide (LASIX) 40 MG tablet Take 40 mg by mouth See admin instructions. Take 1 tablet (40 mg) by mouth daily, may also take an additional tablet as needed for fluid buildup    hydrOXYzine (VISTARIL) 25 MG capsule Take 25 mg by mouth at bedtime as needed (sleep).    nitroGLYCERIN (NITROSTAT) 0.4 MG SL tablet Place 1 tablet (0.4 mg total) under the tongue every 5 (five) minutes as needed. For chest pain Qty: 25 tablet, Refills: 11   Associated Diagnoses: SOB (shortness of breath)      STOP taking these medications     lisinopril (PRINIVIL,ZESTRIL) 2.5 MG tablet          Aspirin prescribed at discharge?  Yes High Intensity Statin Prescribed? (Lipitor 40-80mg  or Crestor 20-40mg ): Yes Beta Blocker Prescribed? No: BP refused BB For EF <40%, was ACEI/ARB Prescribed? Yes ADP Receptor Inhibitor Prescribed? (i.e. Plavix etc.-Includes Medically Managed Patients): Yes For EF <40%, Aldosterone Inhibitor Prescribed? Yes Was EF assessed during THIS hospitalization? Yes Was Cardiac Rehab II ordered? (Included Medically managed Patients): No:    Outstanding Labs/Studies   none  Duration of Discharge Encounter   Greater than 30 minutes including physician time.  Signed, Robbie Lis PA-C 12/14/2015, 12:01 PM  Attending Note:   The patient was seen and examined.  Agree with assessment and plan as noted above.  Changes made to the above note as needed.  Patient seen and independently examined with Robbie Lis, PA .   We discussed all aspects of the encounter. I agree with the assessment and plan as stated above.  See progress note from day of discharge Pt is stable for DC follow up in the CHF clinic     I have spent a total of 40 minutes with patient reviewing hospital  notes , telemetry, EKGs, labs and examining patient as well as establishing an assessment and plan that was discussed with the patient. > 50% of time was spent in direct  patient care.    Alvia Grove., MD,  [facility] 12/16/2015, 12:09 PM [address],  [address] Office [phone] Pager [phone]

## 2015-12-14 NOTE — Progress Notes (Signed)
Patient ID: [name], male   DOB: 07/03/1955, 60 y.o.   MRN: [id number]   60 yo with history of CAD s/p multiple PCI, ischemic cardiomyopathy (EF 15% 10/16), VT with Medtronic ICD, smoking, h/o TIA who was admitted with low output CHF.  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   LHC/RHC (9/17): 85% ostial D2, 99% ostial OM1 (no change from prior) with patent LAD, LCX, OM2, and RCA stents; mean RA 8,  Moderate  PA 58/27 mean 39, mean PCWP 29, CI 2.25, PVR 2.1.   Echo (10/16) with EF 15%, mild to moderate AI, moderate MR, mildly decreased RV systolic function.   He had PICC line placed, initial co-ox was 31% and he was started on milrinone.     10/18 device upgrade to BiV ICD.  He is a-sensed, v-paced this morning. Had a couple short NSVT runs (4 beats).    Today's CO-OX is 64% on milrinone 0.125 mcg, CVP 6.   Feels great, much improved.   Denies SOB.    Scheduled Meds: . aspirin EC  81 mg Oral Daily  . atorvastatin  40 mg Oral Daily  . clopidogrel  75 mg Oral Daily  . digoxin  0.125 mg Oral Daily  . furosemide  40 mg Oral Daily  . LORazepam  1 mg Oral Once  . losartan  25 mg Oral BID  . sodium chloride flush  3 mL Intravenous Q12H  . spironolactone  25 mg Oral Daily   Continuous Infusions:   PRN Meds:.sodium chloride, acetaminophen, albuterol, ALPRAZolam, clotrimazole, hydrOXYzine, nitroGLYCERIN, ondansetron (ZOFRAN) IV, oxyCODONE-acetaminophen, sodium chloride flush, sodium chloride flush, traMADol, zolpidem    Vitals:   12/14/15 0015 12/14/15 0359 12/14/15 0740 12/14/15 0837  BP: 107/69 (!) 101/55 104/61 112/64  Pulse: 77 80 81 75  Resp: 14 16 15 16   Temp: 97.8 F (36.6 C) 97.4 F (36.3 C) 97.6 F (36.4 C) 97.7 F (36.5 C)  TempSrc: Oral Oral Oral Oral  SpO2: 97% 99% 100% 99%  Weight:  193 lb 6.4 oz (87.7 kg)    Height:        Intake/Output Summary (Last 24 hours) at 12/14/15 0951 Last data filed at 12/14/15 0839  Gross per 24  hour  Intake              840 ml  Output             3375 ml  Net            -2535 ml    LABS: Basic Metabolic Panel:  Recent Labs  78/29/56 0500 12/14/15 0511  NA 135 134*  K 4.6 4.3  CL 107 105  CO2 25 25  GLUCOSE 102* 104*  BUN 15 15  CREATININE 0.98 0.87  CALCIUM 9.0 8.9   Liver Function Tests: No results for input(s): AST, ALT, ALKPHOS, BILITOT, PROT, ALBUMIN in the last 72 hours. No results for input(s): LIPASE, AMYLASE in the last 72 hours. CBC:  Recent Labs  12/14/15 0511  WBC 5.2  HGB 13.7  HCT 41.6  MCV 91.6  PLT 224   Cardiac Enzymes: No results for input(s): CKTOTAL, CKMB, CKMBINDEX, TROPONINI in the last 72 hours. BNP: Invalid input(s): POCBNP D-Dimer: No results for input(s): DDIMER in the last 72 hours. Hemoglobin A1C: No results for input(s): HGBA1C in the last 72 hours. Fasting Lipid Panel: No results for input(s): CHOL, HDL, LDLCALC, TRIG, CHOLHDL, LDLDIRECT in the  last 72 hours. Thyroid Function Tests: No results for input(s): TSH, T4TOTAL, T3FREE, THYROIDAB in the last 72 hours.  Invalid input(s): FREET3 Anemia Panel: No results for input(s): VITAMINB12, FOLATE, FERRITIN, TIBC, IRON, RETICCTPCT in the last 72 hours.  RADIOLOGY: Dg Chest 2 View  Result Date: 12/12/2015 CLINICAL DATA:  Pacemaker. EXAM: CHEST  2 VIEW COMPARISON:  12/06/2015 . FINDINGS: AICD in good anatomic position. Right PICC line noted with tip projected over the superior vena cava. Stable cardiomegaly. Low lung volumes. No pleural effusion or pneumothorax. Biapical pleural thickening noted consistent scarring. IMPRESSION: 1.  PICC line noted with tip projected over superior vena cava. 2. AICD noted in good anatomic position. Stable cardiomegaly. Low lung volumes. Electronically Signed   By: Maisie Fushomas  Register   On: 12/12/2015 07:53   Dg Chest 2 View  Result Date: 12/06/2015 CLINICAL DATA:  Chest pressure, shortness breath, low heart rate. EXAM: CHEST  2 VIEW COMPARISON:   10/25/2015 FINDINGS: Cardiomegaly. Left AICD remains in place, unchanged. Biapical scarring. Lungs otherwise clear. No effusions, edema or acute bony abnormality. IMPRESSION: Cardiomegaly.  No active disease. Electronically Signed   By: Charlett NoseKevin  Dover M.D.   On: 12/06/2015 16:32    PHYSICAL EXAM CVP ~1  General: NAD Neck: JVP flat  no thyromegaly or thyroid nodule.  Lungs: Clear to auscultation bilaterally with normal respiratory effort. CV: Lateral PMI.  Heart irregular S1/S2, no S3/S4, 3/6 HSM apex.  No edema.    Abdomen: Soft, nontender, no hepatosplenomegaly, no distention.  Neurologic: Alert and oriented x 3.  Psych: Normal affect. Extremities: No clubbing or cyanosis.   TELEMETRY: NSR, BiV pacing.  Short runs NSVT noted.   ASSESSMENT AND PLAN: 60 yo with history of CAD s/p multiple PCI, ischemic cardiomyopathy (EF 15% 10/16), VT with Medtronic ICD, smoking, h/o TIA who was admitted with low output CHF.  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   1. Acute on chronic systolic CHF: With low cardiac output noted.  Ischemic cardiomyopathy, had upgrade to Medtronic BiV ICD this admission.  Co-ox markedly low at admission, 31%.  Co-ox now 64% after decreasing milrinone from 0.25 => 0.125 yesterday.  CVP 6.   - milrinone is now off.     Continue digoxin.    - on  Lasix 40 mg daily   Does not want to start Coreg.   Will let the CHF team initiate that when appropriate.  - Increase losartan to 25 mg bid.  Anticipate Entresto use in the future. Renal function stable.   - Continue spironolactone to 25 mg daily.   -    - He has been noted to have type 1 2nd degree AVB and 2:1 AVB.  Suspect contribution to HF with loss of AV synchrony (and also has been bradycardic).  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  Now s/p upgrade to BiV ICD and a-sensed/v-paced.  Hopefully this will help.  - Concerned that he may be nearing the need for advanced therapies.  Would not be transplant  candidate at this time with active smoking.  LVAD would be an option.  Has 3 children and lives with sister.  Will follow his clinical trajectory closely. Hopefully can titrate off milrinone with device upgrade and aggressive medical management.   2. CAD: Stable, no chest pain. Recent LHC in 9/17 with stable coronary disease.  - Continue ASA 81, Plavix, and statin.  3. Smoking:  He has stated that he has  stopped.  4. 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  Suspect contribution to HF with loss of AV synchrony (and also has been bradycardic).  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  He is now s/p BiV upgrade and is a-sensed/v-paced.   5. NSVT: Short NSVT runs, suspect this will resolve with stopping milrinone.  Continue to follow on telemetry.   6. Disposition: Home today     Kristeen Miss  12/14/2015 9:51 AM

## 2015-12-16 ENCOUNTER — Encounter (HOSPITAL_COMMUNITY): Payer: Self-pay

## 2015-12-16 ENCOUNTER — Emergency Department (HOSPITAL_COMMUNITY): Payer: Medicaid Other

## 2015-12-16 ENCOUNTER — Emergency Department (HOSPITAL_COMMUNITY)
Admission: EM | Admit: 2015-12-16 | Discharge: 2015-12-16 | Disposition: A | Discharge status: Home / Self Care | Payer: Medicaid Other | Attending: Emergency Medicine | Admitting: Emergency Medicine

## 2015-12-16 ENCOUNTER — Telehealth: Payer: Self-pay | Admitting: Internal Medicine

## 2015-12-16 DIAGNOSIS — Z7982 Long term (current) use of aspirin: Secondary | ICD-10-CM | POA: Insufficient documentation

## 2015-12-16 DIAGNOSIS — Z87891 Personal history of nicotine dependence: Secondary | ICD-10-CM | POA: Diagnosis not present

## 2015-12-16 DIAGNOSIS — R1031 Right lower quadrant pain: Secondary | ICD-10-CM | POA: Insufficient documentation

## 2015-12-16 DIAGNOSIS — I110 Hypertensive heart disease with heart failure: Secondary | ICD-10-CM | POA: Insufficient documentation

## 2015-12-16 DIAGNOSIS — I5023 Acute on chronic systolic (congestive) heart failure: Secondary | ICD-10-CM | POA: Diagnosis not present

## 2015-12-16 DIAGNOSIS — Z95810 Presence of automatic (implantable) cardiac defibrillator: Secondary | ICD-10-CM | POA: Insufficient documentation

## 2015-12-16 DIAGNOSIS — Z8673 Personal history of transient ischemic attack (TIA), and cerebral infarction without residual deficits: Secondary | ICD-10-CM | POA: Diagnosis not present

## 2015-12-16 DIAGNOSIS — R109 Unspecified abdominal pain: Secondary | ICD-10-CM | POA: Diagnosis present

## 2015-12-16 DIAGNOSIS — I2510 Atherosclerotic heart disease of native coronary artery without angina pectoris: Secondary | ICD-10-CM | POA: Insufficient documentation

## 2015-12-16 DIAGNOSIS — Z955 Presence of coronary angioplasty implant and graft: Secondary | ICD-10-CM | POA: Diagnosis not present

## 2015-12-16 LAB — CBC WITH DIFFERENTIAL/PLATELET
BASOS PCT: 1 %
Basophils Absolute: 0.1 10*3/uL (ref 0.0–0.1)
Eosinophils Absolute: 0.6 10*3/uL (ref 0.0–0.7)
Eosinophils Relative: 5 %
HEMATOCRIT: 45.6 % (ref 39.0–52.0)
HEMOGLOBIN: 15.8 g/dL (ref 13.0–17.0)
LYMPHS PCT: 22 %
Lymphs Abs: 2.3 10*3/uL (ref 0.7–4.0)
MCH: 31 pg (ref 26.0–34.0)
MCHC: 34.6 g/dL (ref 30.0–36.0)
MCV: 89.6 fL (ref 78.0–100.0)
MONOS PCT: 9 %
Monocytes Absolute: 0.9 10*3/uL (ref 0.1–1.0)
NEUTROS ABS: 6.7 10*3/uL (ref 1.7–7.7)
Neutrophils Relative %: 63 %
Platelets: 271 10*3/uL (ref 150–400)
RBC: 5.09 MIL/uL (ref 4.22–5.81)
RDW: 15 % (ref 11.5–15.5)
WBC: 10.6 10*3/uL — ABNORMAL HIGH (ref 4.0–10.5)

## 2015-12-16 LAB — URINALYSIS, ROUTINE W REFLEX MICROSCOPIC
BILIRUBIN URINE: NEGATIVE
Glucose, UA: NEGATIVE mg/dL
Hgb urine dipstick: NEGATIVE
Ketones, ur: NEGATIVE mg/dL
LEUKOCYTES UA: NEGATIVE
NITRITE: NEGATIVE
PH: 5.5 (ref 5.0–8.0)
Protein, ur: NEGATIVE mg/dL
SPECIFIC GRAVITY, URINE: 1.019 (ref 1.005–1.030)

## 2015-12-16 LAB — COMPREHENSIVE METABOLIC PANEL
ALT: 105 U/L — ABNORMAL HIGH (ref 17–63)
ANION GAP: 11 (ref 5–15)
AST: 78 U/L — ABNORMAL HIGH (ref 15–41)
Albumin: 4 g/dL (ref 3.5–5.0)
Alkaline Phosphatase: 78 U/L (ref 38–126)
BILIRUBIN TOTAL: 1 mg/dL (ref 0.3–1.2)
BUN: 26 mg/dL — ABNORMAL HIGH (ref 6–20)
CO2: 20 mmol/L — ABNORMAL LOW (ref 22–32)
Calcium: 9.7 mg/dL (ref 8.9–10.3)
Chloride: 101 mmol/L (ref 101–111)
Creatinine, Ser: 1.11 mg/dL (ref 0.61–1.24)
Glucose, Bld: 97 mg/dL (ref 65–99)
POTASSIUM: 4.3 mmol/L (ref 3.5–5.1)
Sodium: 132 mmol/L — ABNORMAL LOW (ref 135–145)
TOTAL PROTEIN: 7.9 g/dL (ref 6.5–8.1)

## 2015-12-16 MED ORDER — ONDANSETRON HCL 4 MG/2ML IJ SOLN
4.0000 mg | Freq: Once | INTRAMUSCULAR | Status: AC
  Administered 2015-12-16: 4 mg via INTRAVENOUS
  Filled 2015-12-16: qty 2

## 2015-12-16 MED ORDER — HYDROMORPHONE HCL 2 MG/ML IJ SOLN
1.0000 mg | Freq: Once | INTRAMUSCULAR | Status: AC
  Administered 2015-12-16: 1 mg via INTRAVENOUS
  Filled 2015-12-16: qty 1

## 2015-12-16 MED ORDER — MORPHINE SULFATE (PF) 4 MG/ML IV SOLN
4.0000 mg | Freq: Once | INTRAVENOUS | Status: AC
  Administered 2015-12-16: 4 mg via INTRAVENOUS
  Filled 2015-12-16: qty 1

## 2015-12-16 MED ORDER — HYDROCODONE-ACETAMINOPHEN 5-325 MG PO TABS: 2.0000 | ORAL_TABLET | Freq: Four times a day (QID) | ORAL | 0 refills | Status: DC | PRN

## 2015-12-16 NOTE — Telephone Encounter (Signed)
Pt was in hospital had to have a wire replaced on his Medtronic device and does not need the monitor at this time. I have removed the monitor from the work que.   [name] LaundrySonya

## 2015-12-16 NOTE — Discharge Instructions (Signed)
Hydrocodone as prescribed as needed for pain.  Return to the ER if you develop worsening pain, high fevers, bloody stool, or other new and concerning symptoms.

## 2015-12-16 NOTE — ED Triage Notes (Signed)
Pt [facility] c/o new onset abd pain, RLQ, today, deneis N/V/D, denies fever/chills.  Pt reports discharge Saturday with new pacemaker/defib. Pt AOx4, distress noted.

## 2015-12-16 NOTE — ED Notes (Signed)
Pt provided with water, states he does not think he can provide urine sample at this time

## 2015-12-16 NOTE — ED Provider Notes (Signed)
[facility] DEPT Provider Note   CSN: [id number] Arrival date & time: 12/16/15  1802     History   Chief Complaint Chief Complaint  Patient presents with  . Abdominal Pain    HPI [name] is a 60 y.o. male.  Patient is a 60 year old male with PMH of CAD, CHF status post recent AICD/pacemaker upgrade. He presents today for evaluation of right-sided abdominal pain. This started acutely this morning shortly after waking. He denies any nausea or vomiting. He denies any constipation or diarrhea. He denies any fevers or chills. He denies any urinary complaints. His pain is worsened throughout the day and is worse when he moves or pushes on this area. There are no alleviating factors. He does report a history of prior kidney stone many years ago and is uncertain if this feels similar.      Past Medical History:  Diagnosis Date  . Anxiety   . Automatic implantable cardioverter-defibrillator in situ    a. Medtronic ICD.  [name] CAD (coronary artery disease)    a.  Severe LAD stenosis 2/2 acute thrombus - BMS 2009;  b. 06/01/11 Cath - LAD 20 isr, 23m (3.0x16 Veri-flex BMS), OM1 100p, EF 20-25%;  c. 07/2012 Abnl Cardiolite;  d. 08/2012 Cath/PCI: LM nl, LAD patent stents, D2 80-90 (jailed), LCX 90 p/m (4.0x24 Promus Premier DES), OM1 100, OM2 80-90p (3.0x20 Promus Premier DES), RCA patent mid stent, 40d, EF 25%.  e. 10/29/15 LHC stable disease with patent stents  . Chronic systolic CHF (congestive heart failure), NYHA class 3 (HCC) - low output state 12/12/2014  . Depression   . Dyspnea   . History of rheumatic fever 1962  . HLD (hyperlipidemia) mixed  . HTN (hypertension)   . Ischemic cardiomyopathy    a.  EF 30-35% 2010;  b.  EF 20-25% by LV gram 08/2012. c. EF 15% by echo 11/2014.  [name] Myocardial infarction 1998; 2002; ~ 2010  . Paroxysmal ventricular tachycardia (HCC)    a. VFlutter  CL 210 msec  Rx shock 04/2011  . PVC's (premature ventricular contractions)   . RBBB   . TIA (transient  ischemic attack)    a. Dx 11/2014, continued on ASA/Plavix.    Patient Active Problem List   Diagnosis Date Noted  . DOE (dyspnea on exertion) 06/19/2015  . Acute systolic congestive heart failure, NYHA class 3 (HCC)   . Status post implantation of automatic cardioverter/defibrillator (AICD) - MDT   . H/O medication noncompliance   . Acute kidney insufficiency   . Chest pain at rest   . Depression 04/09/2015  . Acute on chronic systolic CHF (congestive heart failure) (HCC) 04/09/2015  . Erectile dysfunction 04/09/2015  . RBBB   . PVC's (premature ventricular contractions)   . Chronic systolic CHF (congestive heart failure), NYHA class 3 (HCC) - low output state 12/12/2014  . Generalized anxiety disorder 12/09/2014  . Major depressive disorder, recurrent episode (HCC) 12/09/2014  . TIA (transient ischemic attack)   . HLD (hyperlipidemia)   . Aphasia 05/04/2014  . Unstable angina (HCC) 06/02/2011  . CAD (coronary artery disease)   . Ischemic cardiomyopathy   . Automatic implantable cardioverter-defibrillator in situ   . TOBACCO ABUSE 02/04/2010  . VENTRICULAR TACHYCARDIA 08/21/2008  . HYPERLIPIDEMIA-MIXED 06/04/2008  . ANXIETY 06/04/2008  . Essential hypertension 06/04/2008  . Cardiomyopathy, ischemic 06/04/2008  . CHEST PAIN-UNSPECIFIED 06/04/2008    Past Surgical History:  Procedure Laterality Date  . CARDIAC CATHETERIZATION N/A 10/29/2015  Procedure: Right/Left Heart Cath and Coronary Angiography;  Surgeon: Peter M SwazilandJordan, MD;  [facility];  Service: Cardiovascular;  Laterality: N/A;  . CARDIAC DEFIBRILLATOR PLACEMENT     MDT single chamber primary prevention ICD implanted by Dr Graciela HusbandsKlein as part of MASTER study  . EP IMPLANTABLE DEVICE N/A 12/11/2015   Procedure: BiV Upgrade;  Surgeon: Duke SalviaSteven C Klein, MD;  [facility];  Service: Cardiovascular;  Laterality: N/A;  . EP IMPLANTABLE DEVICE N/A 12/11/2015   Procedure: Pocket Revision/Relocation;   Surgeon: Duke SalviaSteven C Klein, MD;  [facility];  Service: Cardiovascular;  Laterality: N/A;  . EP IMPLANTABLE DEVICE N/A 12/11/2015   Procedure: Lead Revision/Repair;  Surgeon: Duke SalviaSteven C Klein, MD;  [facility];  Service: Cardiovascular;  Laterality: N/A;  . HERNIA REPAIR     "w/gallbladder OR" (08/29/2012)  . LAPAROSCOPIC CHOLECYSTECTOMY    . LEFT HEART CATHETERIZATION WITH CORONARY ANGIOGRAM N/A 06/08/2011   Procedure: LEFT HEART CATHETERIZATION WITH CORONARY ANGIOGRAM;  Surgeon: Dolores Pattyaniel R Bensimhon, MD;  [facility];  Service: Cardiovascular;  Laterality: N/A;  . PERCUTANEOUS CORONARY STENT INTERVENTION (PCI-S) N/A 06/01/2011   Procedure: PERCUTANEOUS CORONARY STENT INTERVENTION (PCI-S);  Surgeon: Peter M SwazilandJordan, MD;  [facility];  Service: Cardiovascular;  Laterality: N/A;  . PERCUTANEOUS CORONARY STENT INTERVENTION (PCI-S) N/A 08/29/2012   Procedure: PERCUTANEOUS CORONARY STENT INTERVENTION (PCI-S);  Surgeon: Peter M SwazilandJordan, MD;  [facility];  Service: Cardiovascular;  Laterality: N/A;  . PERIPHERAL VASCULAR CATHETERIZATION  12/11/2015   Procedure: Upper Extremity Venography;  Surgeon: Duke SalviaSteven C Klein, MD;  [facility];  Service: Cardiovascular;;       Home Medications    Prior to Admission medications   Medication Sig Start Date End Date Taking? Authorizing Provider  albuterol (PROAIR HFA) 108 (90 Base) MCG/ACT inhaler Inhale 2 puffs into the lungs every 4 (four) hours as needed for wheezing or shortness of breath.    Historical Provider, MD  ALPRAZolam Prudy Feeler(XANAX) 0.25 MG tablet Take 1 tablet (0.25 mg total) by mouth 3 (three) times daily as needed for anxiety. Patient taking differently: Take 0.25 mg by mouth daily as needed for anxiety.  10/18/15   Loren Raceravid Yelverton, MD  aspirin EC 81 MG tablet Take 81 mg by mouth daily.    Historical Provider, MD  atorvastatin (LIPITOR) 40 MG tablet Take 1 tablet (40 mg total) by mouth  daily at 6 PM. Patient taking differently: Take 40 mg by mouth daily.  03/29/15   Lewayne BuntingBrian S Crenshaw, MD  clopidogrel (PLAVIX) 75 MG tablet Take 75 mg by mouth daily.     Historical Provider, MD  clotrimazole-betamethasone (LOTRISONE) cream Apply 1 application topically 2 (two) times daily. Patient taking differently: Apply 1 application topically 2 (two) times daily as needed (eczema).  06/27/15   [name] G Barrett, PA-C  digoxin (LANOXIN) 0.125 MG tablet Take 1 tablet (0.125 mg total) by mouth daily. 12/15/15   Brittainy Sherlynn CarbonM Simmons, PA-C  furosemide (LASIX) 40 MG tablet Take 40 mg by mouth See admin instructions. Take 1 tablet (40 mg) by mouth daily, may also take an additional tablet as needed for fluid buildup    Historical Provider, MD  hydrOXYzine (VISTARIL) 25 MG capsule Take 25 mg by mouth at bedtime as needed (sleep).    Historical Provider, MD  losartan (COZAAR) 25 MG tablet Take 1 tablet (25 mg total) by mouth 2 (two) times daily. 12/14/15  Brittainy Sherlynn Carbon, PA-C  nitroGLYCERIN (NITROSTAT) 0.4 MG SL tablet Place 1 tablet (0.4 mg total) under the tongue every 5 (five) minutes as needed. For chest pain Patient taking differently: Place 0.4 mg under the tongue every 5 (five) minutes as needed for chest pain.  03/29/15   [name], MD  spironolactone (ALDACTONE) 25 MG tablet Take 1 tablet (25 mg total) by mouth daily. 12/15/15   Brittainy Sherlynn Carbon, PA-C    Family History Family History  Problem Relation Age of Onset  . Cancer Father   . Cancer Sister     twin sister and only one has cancer, unknown what kind  . Hypertension Brother   . Cancer      family hx  . Heart attack Neg Hx   . Stroke Neg Hx     Social History Social History  Substance Use Topics  . Smoking status: Former Smoker    Packs/day: 0.50    Years: 44.00    Types: Cigarettes    Quit date: 11/20/2015  . Smokeless tobacco: Never Used  . Alcohol use No     Comment: 08/29/2012 "6-8 beers once/wk". Has not had  alcohol in the last 6-8 months     Allergies   Review of patient's allergies indicates no known allergies.   Review of Systems Review of Systems  All other systems reviewed and are negative.    Physical Exam Updated Vital Signs BP 123/81   Pulse 99   Temp 98.6 F (37 C) (Oral)   Resp 24   SpO2 96%   Physical Exam  Constitutional: He is oriented to person, place, and time. He appears well-developed and well-nourished. No distress.  HENT:  Head: Normocephalic and atraumatic.  Mouth/Throat: Oropharynx is clear and moist.  Neck: Normal range of motion. Neck supple.  Cardiovascular: Normal rate and regular rhythm.  Exam reveals no friction rub.   No murmur heard. Pulmonary/Chest: Effort normal and breath sounds normal. No respiratory distress. He has no wheezes. He has no rales.  Abdominal: Soft. Bowel sounds are normal. He exhibits no distension. There is tenderness. There is no rebound and no guarding.  There is mild tenderness to the right mid abdomen.  Musculoskeletal: Normal range of motion. He exhibits no edema.  Neurological: He is alert and oriented to person, place, and time. Coordination normal.  Skin: Skin is warm and dry. He is not diaphoretic.  Nursing note and vitals reviewed.    ED Treatments / Results  Labs (all labs ordered are listed, but only abnormal results are displayed) Labs Reviewed  COMPREHENSIVE METABOLIC PANEL  CBC WITH DIFFERENTIAL/PLATELET  URINALYSIS, ROUTINE W REFLEX MICROSCOPIC (NOT AT [facility])    EKG  EKG Interpretation None       Radiology No results found.  Procedures Procedures (including critical care time)  Medications Ordered in ED Medications  morphine 4 MG/ML injection 4 mg (not administered)  ondansetron (ZOFRAN) injection 4 mg (not administered)     Initial Impression / Assessment and Plan / ED Course  I have reviewed the triage vital signs and the nursing notes.  Pertinent labs & imaging results that were  available during my care of the patient were reviewed by me and considered in my medical decision making (see chart for details).  Clinical Course    Patient presents with complaints of severe pain in the right side of his abdomen. His laboratory studies are unremarkable and CT scan shows no evidence for appendicitis, renal calculus, or  other acute abnormality. He was given medications in the ER and is feeling better. He will be discharged with a small quantity of hydrocodone and is to follow-up with his primary Dr. if not improving.  Final Clinical Impressions(s) / ED Diagnoses   Final diagnoses:  None    New Prescriptions New Prescriptions   No medications on file     Geoffery Lyons, MD 12/16/15 2351

## 2015-12-17 ENCOUNTER — Telehealth (HOSPITAL_COMMUNITY): Payer: Self-pay | Admitting: Surgery

## 2015-12-17 NOTE — Telephone Encounter (Signed)
I attempted to call patient to check on him after recent hospitalization.  There was no answer and no opportunity to leave a message.  I will attempt again at another time.

## 2015-12-18 ENCOUNTER — Encounter (HOSPITAL_COMMUNITY): Payer: Self-pay | Admitting: Cardiology

## 2015-12-19 ENCOUNTER — Telehealth: Payer: Self-pay | Admitting: Licensed Clinical Social Worker

## 2015-12-19 LAB — CUP PACEART INCLINIC DEVICE CHECK
Battery Voltage: 2.65 V
HighPow Impedance: 38 Ohm
HighPow Impedance: 51 Ohm
Lead Channel Pacing Threshold Amplitude: 1.75 V
Lead Channel Pacing Threshold Pulse Width: 0.4 ms
Lead Channel Setting Pacing Amplitude: 3 V
Lead Channel Setting Sensing Sensitivity: 0.3 mV
MDC IDC MSMT LEADCHNL RV IMPEDANCE VALUE: 418 Ohm
MDC IDC MSMT LEADCHNL RV SENSING INTR AMPL: 2.4 mV
MDC IDC SESS DTM: 20171010164806
MDC IDC SET LEADCHNL RV PACING PULSEWIDTH: 0.4 ms
MDC IDC STAT BRADY RV PERCENT PACED: 0.05 %

## 2015-12-19 NOTE — Telephone Encounter (Signed)
CSW contacted patient to follow up on medicaid application status. Patient reports "in progress". Patient followed up with caseworker and provided necessary documents. Patient reports he has appointment on Monday and will follow up with CSW at that time. Lasandra BeechJackie Braylyn Eye, LCSW [phone]

## 2015-12-22 ENCOUNTER — Encounter: Payer: Self-pay | Admitting: Internal Medicine

## 2015-12-22 NOTE — Progress Notes (Signed)
ICD Criteria  Current LVEF:15%. Within 12 months prior to implant: Yes   Heart failure history: Yes, Class III  Cardiomyopathy history: Yes, Ischemic Cardiomyopathy - Prior MI.  Atrial Fibrillation/Atrial Flutter: No.  Ventricular tachycardia history: Yes, Hemodynamic instability present. VT Type: Sustained Ventricular Tachycardia - Monomorphic.  Cardiac arrest history: No.  History of syndromes with risk of sudden death: No.  Previous ICD: Yes, Reason for ICD:  Primary prevention.  Current ICD indication: Secondary  PPM indication: Yes. Pacing type: Ventricular. Less than 40% RV pacing requirement anticipated. Indication: Complete Heart Block   Class I or II Bradycardia indication present: Yes  Beta Blocker therapy for 3 or more months: No, medical reason.  Ace Inhibitor/ARB therapy for 3 or more months: Yes, prescribed.

## 2015-12-23 ENCOUNTER — Ambulatory Visit (HOSPITAL_COMMUNITY)
Admission: RE | Admit: 2015-12-23 | Discharge: 2015-12-23 | Disposition: A | Discharge status: Home / Self Care | Payer: Medicaid Other | Source: Ambulatory Visit | Attending: Cardiology | Admitting: Cardiology

## 2015-12-23 ENCOUNTER — Telehealth: Payer: Self-pay | Admitting: Licensed Clinical Social Worker

## 2015-12-23 VITALS — BP 96/64 | HR 103 | Ht 71.0 in | Wt 193.1 lb

## 2015-12-23 DIAGNOSIS — I255 Ischemic cardiomyopathy: Secondary | ICD-10-CM | POA: Insufficient documentation

## 2015-12-23 DIAGNOSIS — Z7982 Long term (current) use of aspirin: Secondary | ICD-10-CM | POA: Insufficient documentation

## 2015-12-23 DIAGNOSIS — Z720 Tobacco use: Secondary | ICD-10-CM

## 2015-12-23 DIAGNOSIS — I2510 Atherosclerotic heart disease of native coronary artery without angina pectoris: Secondary | ICD-10-CM

## 2015-12-23 DIAGNOSIS — Z87891 Personal history of nicotine dependence: Secondary | ICD-10-CM | POA: Diagnosis not present

## 2015-12-23 DIAGNOSIS — Z79899 Other long term (current) drug therapy: Secondary | ICD-10-CM | POA: Insufficient documentation

## 2015-12-23 DIAGNOSIS — I5022 Chronic systolic (congestive) heart failure: Secondary | ICD-10-CM | POA: Diagnosis present

## 2015-12-23 LAB — COMPREHENSIVE METABOLIC PANEL
ALT: 61 U/L (ref 17–63)
ANION GAP: 9 (ref 5–15)
AST: 43 U/L — ABNORMAL HIGH (ref 15–41)
Albumin: 4.1 g/dL (ref 3.5–5.0)
Alkaline Phosphatase: 76 U/L (ref 38–126)
BUN: 28 mg/dL — ABNORMAL HIGH (ref 6–20)
CHLORIDE: 103 mmol/L (ref 101–111)
CO2: 24 mmol/L (ref 22–32)
CREATININE: 0.98 mg/dL (ref 0.61–1.24)
Calcium: 10.3 mg/dL (ref 8.9–10.3)
Glucose, Bld: 94 mg/dL (ref 65–99)
Potassium: 4.7 mmol/L (ref 3.5–5.1)
SODIUM: 136 mmol/L (ref 135–145)
Total Bilirubin: 0.8 mg/dL (ref 0.3–1.2)
Total Protein: 8.3 g/dL — ABNORMAL HIGH (ref 6.5–8.1)

## 2015-12-23 LAB — DIGOXIN LEVEL: DIGOXIN LVL: 0.4 ng/mL — AB (ref 0.8–2.0)

## 2015-12-23 MED ORDER — BISOPROLOL FUMARATE 5 MG PO TABS: 2.5000 mg | ORAL_TABLET | Freq: Every day | ORAL | 3 refills | Status: DC

## 2015-12-23 NOTE — Telephone Encounter (Signed)
CSW attempted to reach patient to follow up on pending medicaid application with no success. Patient has number for return call if needed. CSW available as needed. Lasandra BeechJackie Caeden Foots, LCSW [phone]

## 2015-12-23 NOTE — Progress Notes (Signed)
Cardiology: Dr. Jens Som HF Cardiology: Dr. Shirlee Latch  60 yo with history of smoking, CAD s/p multiple PCIs, chronic systolic CHF/ischemic cardiomyopathy presents for HF clinic followup after recent admission.  Last cardiac cath in 9/17 showed stable anatomy, no intervention.  EF has been markedly reduced for several years now.  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 PICC was placed and low cardiac output confirmed.  He was started on milrinone and diuresed.  He had CRT upgrade of his Medtronic device.    Currently doing ok, feels better than prior to hospital.  Short of breath if he walks briskly for 100 feet.  No chest pain.  No orthopnea/PND. SBP runs in the 90s at times but he denies lightheadedness.  He has had a hard time taking Coreg in the past, it makes him "feel bad."  He is trying to quit smoking.   He was seen in the ER on 12/16/15 with RLQ abdominal pain, no diarrhea. CT abdomen showed no acute findings.  Transaminases were mildly elevate.  Pain resolved after a day or 2.   ECG: NSR, BiV paced  Labs (10/17): hgb 15.8, K 4.3, creatinine 1.11, AST 78, ALT 105  PMH: 1. Active smoker: PFTs (6/17) with minimal obstruction on PFTs.  2. Coronary artery disease: Multiple PCIs in the past.  - LHC/RHC (9/17): 85% ostial D2, 99% ostial OM1 (no change from prior) with patent LAD, LCX, OM2, and RCA stents; mean RA 8, PA 58/27 mean 39, mean PCWP 29, CI 2.25, PVR 2.1.  3. Chronic systolic CHF: Ischemic cardiomyopathy.   - Echo (10/17): EF 15%, moderate MR, mild to moderate AI, mildly decreased RV systolic function.  - Medtronic CRT-D device.  4. 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  5. H/o TIA  ROS: All systems reviewed and negative except as per HPI.   Social History   Social History  . Marital status: Legally Separated    Spouse name: N/A  . Number of children: N/A  .  Years of education: N/A   Occupational History  . Not on file.   Social History Main Topics  . Smoking status: Former Smoker    Packs/day: 0.50    Years: 44.00    Types: Cigarettes    Quit date: 11/20/2015  . Smokeless tobacco: Never Used  . Alcohol use No     Comment: 08/29/2012 "6-8 beers once/wk". Has not had alcohol in the last 6-8 months  . Drug use: No  . Sexual activity: Not Currently   Other Topics Concern  . Not on file   Social History Narrative   Married; full time.    Family History  Problem Relation Age of Onset  . Cancer Father   . Cancer Sister     twin sister and only one has cancer, unknown what kind  . Hypertension Brother   . Cancer      family hx  . Heart attack Neg Hx   . Stroke Neg Hx    Current Outpatient Prescriptions  Medication Sig Dispense Refill  . albuterol (PROAIR HFA) 108 (90 Base) MCG/ACT inhaler Inhale 2 puffs into the lungs every 4 (four) hours as needed for wheezing or shortness of breath.    [name] aspirin EC 81 MG tablet Take 81 mg by mouth daily.    [name] atorvastatin (LIPITOR) 40 MG tablet Take 40 mg  by mouth daily.    . clopidogrel (PLAVIX) 75 MG tablet Take 75 mg by mouth daily.     . digoxin (LANOXIN) 0.125 MG tablet Take 1 tablet (0.125 mg total) by mouth daily. 30 tablet 5  . furosemide (LASIX) 40 MG tablet Take 40 mg by mouth See admin instructions. Take 1 tablet (40 mg) by mouth daily, may also take an additional tablet as needed for fluid buildup    . hydrOXYzine (VISTARIL) 25 MG capsule Take 25 mg by mouth at bedtime as needed (sleep).    . losartan (COZAAR) 25 MG tablet Take 25 mg by mouth daily.    [name]. spironolactone (ALDACTONE) 25 MG tablet Take 1 tablet (25 mg total) by mouth daily. 30 tablet 5  . ALPRAZolam (XANAX) 0.25 MG tablet Take 1 tablet (0.25 mg total) by mouth 3 (three) times daily as needed for anxiety. (Patient not taking: Reported on 12/23/2015) 10 tablet 0  . bisoprolol (ZEBETA) 5 MG tablet Take 0.5 tablets (2.5 mg  total) by mouth at bedtime. 15 tablet 3  . clotrimazole-betamethasone (LOTRISONE) cream Apply 1 application topically 2 (two) times daily. (Patient not taking: Reported on 12/23/2015) 15 g 0  . nitroGLYCERIN (NITROSTAT) 0.4 MG SL tablet Place 1 tablet (0.4 mg total) under the tongue every 5 (five) minutes as needed. For chest pain (Patient not taking: Reported on 12/23/2015) 25 tablet 11   No current facility-administered medications for this encounter.    BP 96/64 (BP Location: Right Arm, Patient Position: Sitting, Cuff Size: Normal)   Pulse (!) 103   Ht 5\' 11"  (1.803 m)   Wt 193 lb 1.9 oz (87.6 kg)   SpO2 96%   BMI 26.93 kg/m  General: NAD Neck: No JVD, no thyromegaly or thyroid nodule.  Lungs: Clear to auscultation bilaterally with normal respiratory effort. CV: Nondisplaced PMI.  Heart regular S1/S2, no S3/S4, no murmur.  No peripheral edema.  No carotid bruit.  Normal pedal pulses.  Abdomen: Soft, nontender, no hepatosplenomegaly, no distention.  Skin: Intact without lesions or rashes.  Neurologic: Alert and oriented x 3.  Psych: Normal affect. Extremities: No clubbing or cyanosis.  HEENT: Normal.   Assessment/Plan: 1. Chronic systolic CHF: With low cardiac output noted at last admission in 10/17.  Ischemic cardiomyopathy, had upgrade to Medtronic BiV ICD in 10/17.  Volume looks ok on exam, NYHA class III symptoms.  - Continue Lasix 40 mg daily.   - Continue losartan 25 mg daily.  Do not have BP room [facility].  - Continue spironolactone 25 mg daily.   - Continue digoxin, check level today.  - Add low dose bisoprolol 2.5 mg daily, take in evening.  - I am concerned that he may be nearing the need for advanced therapies.  Would not be transplant candidate at this time with active smoking.  LVAD would be an option.  Has 3 children and lives with sister.  Will follow his clinical trajectory closely.  I will arrange for CPX.  2. CAD: Stable, no chest pain. Recent LHC in  9/17 with stable coronary disease.  - Continue ASA 81, Plavix, and statin.  3. Smoking: Encouraged him to quit. He is trying.  4. 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  Suspect contribution to HF with loss of AV synchrony (and also was bradycardic).  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  He is now s/p BiV upgrade and is a-sensed/v-paced.   Followup in 2  wks.   Marca AnconaDalton McLean 12/23/2015

## 2015-12-23 NOTE — Patient Instructions (Signed)
Routine lab work today. Will notify you of abnormal results, otherwise no news is good news!  Will schedule you for Cardiopulmonary Exercise Test. This test is done at our Heart Failure Clinic. Please wear comfortable clothes and shoes for this test. Avoid heavy meal before the test (light snack/meal recommended). Avoid caffeine, alcohol, tobacco products 12 hrs before test. Please give 24 hr notice for cancellations/rescheduling: [phone].  START Bisoprolol 2.5 mg (1/2 tablet) once daily at bedtime.  EKG today.  Follow up 2 weeks with [name].  Do the following things EVERYDAY: 1) Weigh yourself in the morning before breakfast. Write it down and keep it in a log. 2) Take your medicines as prescribed 3) Eat low salt foods-Limit salt (sodium) to 2000 mg per day.  4) Stay as active as you can everyday 5) Limit all fluids for the day to less than 2 liters

## 2015-12-23 NOTE — Progress Notes (Signed)
Advanced Heart Failure Medication Review by a Pharmacist  Does the patient  feel that his/her medications are working for him/her?  yes  Has the patient been experiencing any side effects to the medications prescribed?  no  Does the patient measure his/her own blood pressure or blood glucose at home?  no   Does the patient have any problems obtaining medications due to transportation or finances?   No - Medicaid pending but states he can afford his medication costs  Understanding of regimen: good Understanding of indications: good Potential of compliance: good Patient understands to avoid NSAIDs. Patient understands to avoid decongestants.  Issues to address at subsequent visits: None   Pharmacist comments:  [name] is a pleasant 60 yo M presenting with his wife and a recent hospital discharge medication list. He reports good compliance with his regimen but has only been taking losartan once daily since his medication bottle directions stated to take it this way instead of previously prescribed twice daily. He did not have any specific medication-related questions or concerns for me at this time.   Tyler DeisErika K. Bonnye FavaNicolsen, PharmD, BCPS, CPP Clinical Pharmacist Pager: [phone] Phone: [phone] 12/23/2015 10:28 AM    Time with patient: 10 minutes Preparation and documentation time: 2 minutes Total time: 12 minutes

## 2015-12-26 ENCOUNTER — Ambulatory Visit (INDEPENDENT_AMBULATORY_CARE_PROVIDER_SITE_OTHER): Payer: Self-pay | Admitting: *Deleted

## 2015-12-26 DIAGNOSIS — Z95810 Presence of automatic (implantable) cardiac defibrillator: Secondary | ICD-10-CM

## 2015-12-26 NOTE — Progress Notes (Signed)
Wound check appointment. Steri-strips removed. Wound without redness or edema. Incision edges approximated, wound well healed. Normal device function. Thresholds, sensing, and impedances consistent with implant measurements. Device programmed at 3.5V/ adaptive for extra safety margin until 3 month visit. Histogram distribution appropriate for patient and level of activity. No mode switches or ventricular arrhythmias noted. Patient educated about wound care, arm mobility, lifting restrictions, shock plan. ROV 01/29/2016 with AS. 03/12/16 with SK.

## 2015-12-28 ENCOUNTER — Telehealth: Payer: Self-pay | Admitting: Student

## 2015-12-28 NOTE — Telephone Encounter (Signed)
  Received a call from the patient regarding bilateral scapular pain occurring for the past 3 weeks. Says this has occurred ever since hospital discharge in October. Was recently seen by [name] on 12/23/2015 but reports he forgot to mention this.   The pain is brought on by exertion and relieved with rest. Typically only lasts approximately 5 minutes. No associated dyspnea, diaphoresis, nausea, or vomiting. Has not tried taking any SL NTG.  Patient does not wish to come to the ED as he is not currently having pain. Encouraged him to take SL NTG if pain represents (reviewed dosing instructions and importance of checking BP). Advised patient that if his pain persists, begins to occur at rest, or continues to worsen than he should proceed to the nearest emergency department for further evaluation.   Patient aware of this and appreciative of the call.   Signed, Ellsworth LennoxBrittany M Orly Quimby, PA-C 12/28/2015, 4:31 PM Pager: [phone]

## 2015-12-30 ENCOUNTER — Encounter: Payer: Self-pay | Admitting: Internal Medicine

## 2016-01-02 ENCOUNTER — Other Ambulatory Visit (HOSPITAL_COMMUNITY): Payer: Self-pay | Admitting: *Deleted

## 2016-01-02 ENCOUNTER — Ambulatory Visit (HOSPITAL_COMMUNITY): Payer: Medicaid Other | Attending: Cardiology

## 2016-01-02 DIAGNOSIS — I509 Heart failure, unspecified: Secondary | ICD-10-CM | POA: Diagnosis not present

## 2016-01-02 DIAGNOSIS — I5022 Chronic systolic (congestive) heart failure: Secondary | ICD-10-CM

## 2016-01-06 ENCOUNTER — Encounter (HOSPITAL_COMMUNITY): Payer: Self-pay

## 2016-01-06 NOTE — Progress Notes (Signed)
HPI: FU CAD s/p multiple stents, paroxysmal ventricular tachycardia, ischemic cardiomyopathy, chronic systolic heart failure s/p Medtronic AICD. 2D Echo 12/09/14: EF 15%, grade 2 DD, high ventricular filling pressures, multiple WMA, mild-mod AI, mod MR, mildly dilated RA, severely dilated LA. CTA August 2017 showed ascending aorta 4.1 x 4 cm. Cardiac catheterization September 2017 showed an 85% second diagonal (covered by LAD stent; chronic), 99% first marginal (chronic) and patent stents in the proximal to mid LAD, proximal to mid circumflex, second obtuse marginal and mid right coronary artery. Left ventricular end-diastolic pressure 22 and PA pressure 58/27. Medical therapy recommended. Patient was admitted October 2017 with acute on chronic systolic congestive heart failure. He was noted to be bradycardic with 2-1 AV block and occasional Mobitz 1. Patient was treated with milrinone, diuresed and had CRT upgrade of his device. Echocardiogram October 2017 showed ejection fraction 15%, mild aortic insufficiency, mild to moderate mitral regurgitation, moderate left atrial enlargement and mild right ventricular enlargement. Since last seen, the patient has dyspnea with more extreme activities but not with routine activities. It is relieved with rest. It is not associated with chest pain. There is no orthopnea, PND or pedal edema. There is no syncope or palpitations. There is no exertional chest pain.   Current Outpatient Prescriptions  Medication Sig Dispense Refill  . albuterol (PROAIR HFA) 108 (90 Base) MCG/ACT inhaler Inhale 2 puffs into the lungs every 4 (four) hours as needed for wheezing or shortness of breath.    . ALPRAZolam (XANAX) 0.25 MG tablet Take 1 tablet (0.25 mg total) by mouth 3 (three) times daily as needed for anxiety. 10 tablet 0  . aspirin EC 81 MG tablet Take 81 mg by mouth daily.    [name] atorvastatin (LIPITOR) 40 MG tablet Take 40 mg by mouth daily.    . bisoprolol (ZEBETA) 5 MG  tablet Take 0.5 tablets (2.5 mg total) by mouth at bedtime. 15 tablet 3  . clopidogrel (PLAVIX) 75 MG tablet Take 75 mg by mouth daily.     . clotrimazole-betamethasone (LOTRISONE) cream Apply 1 application topically 2 (two) times daily. 15 g 0  . digoxin (LANOXIN) 0.125 MG tablet Take 1 tablet (0.125 mg total) by mouth daily. 30 tablet 5  . furosemide (LASIX) 40 MG tablet Take 40 mg by mouth See admin instructions. Take 1 tablet (40 mg) by mouth daily, may also take an additional tablet as needed for fluid buildup    . hydrOXYzine (VISTARIL) 25 MG capsule Take 25 mg by mouth at bedtime as needed (sleep).    . losartan (COZAAR) 25 MG tablet Take 25 mg by mouth daily.    . nitroGLYCERIN (NITROSTAT) 0.4 MG SL tablet Place 1 tablet (0.4 mg total) under the tongue every 5 (five) minutes as needed. For chest pain 25 tablet 11  . spironolactone (ALDACTONE) 25 MG tablet Take 1 tablet (25 mg total) by mouth daily. 30 tablet 5   No current facility-administered medications for this visit.      Past Medical History:  Diagnosis Date  . Anxiety   . Automatic implantable cardioverter-defibrillator in situ    a. Medtronic ICD.  [name] CAD (coronary artery disease)    a.  Severe LAD stenosis 2/2 acute thrombus - BMS 2009;  b. 06/01/11 Cath - LAD 20 isr, 38m (3.0x16 Veri-flex BMS), OM1 100p, EF 20-25%;  c. 07/2012 Abnl Cardiolite;  d. 08/2012 Cath/PCI: LM nl, LAD patent stents, D2 80-90 (jailed), LCX 90 p/m (4.0x24 Promus  Premier DES), OM1 100, OM2 80-90p (3.0x20 Promus Premier DES), RCA patent mid stent, 40d, EF 25%.  e. 10/29/15 LHC stable disease with patent stents  . Chronic systolic CHF (congestive heart failure), NYHA class 3 (HCC) - low output state 12/12/2014  . Depression   . Dyspnea   . History of rheumatic fever 1962  . HLD (hyperlipidemia) mixed  . HTN (hypertension)   . Ischemic cardiomyopathy    a.  EF 30-35% 2010;  b.  EF 20-25% by LV gram 08/2012. c. EF 15% by echo 11/2014.  [name]. Myocardial infarction  1998; 2002; ~ 2010  . Paroxysmal ventricular tachycardia (HCC)    a. VFlutter  CL 210 msec  Rx shock 04/2011  . PVC's (premature ventricular contractions)   . RBBB   . TIA (transient ischemic attack)    a. Dx 11/2014, continued on ASA/Plavix.    Past Surgical History:  Procedure Laterality Date  . CARDIAC CATHETERIZATION N/A 10/29/2015   Procedure: Right/Left Heart Cath and Coronary Angiography;  Surgeon: Peter M SwazilandJordan, MD;  [facility];  Service: Cardiovascular;  Laterality: N/A;  . CARDIAC DEFIBRILLATOR PLACEMENT     MDT single chamber primary prevention ICD implanted by Dr Graciela HusbandsKlein as part of MASTER study  . EP IMPLANTABLE DEVICE N/A 12/11/2015   Procedure: BiV Upgrade;  Surgeon: Duke SalviaSteven C Klein, MD;  [facility];  Service: Cardiovascular;  Laterality: N/A;  . EP IMPLANTABLE DEVICE N/A 12/11/2015   Procedure: Pocket Revision/Relocation;  Surgeon: Duke SalviaSteven C Klein, MD;  [facility];  Service: Cardiovascular;  Laterality: N/A;  . EP IMPLANTABLE DEVICE N/A 12/11/2015   Procedure: Lead Revision/Repair;  Surgeon: Duke SalviaSteven C Klein, MD;  [facility];  Service: Cardiovascular;  Laterality: N/A;  . HERNIA REPAIR     "w/gallbladder OR" (08/29/2012)  . LAPAROSCOPIC CHOLECYSTECTOMY    . LEFT HEART CATHETERIZATION WITH CORONARY ANGIOGRAM N/A 06/08/2011   Procedure: LEFT HEART CATHETERIZATION WITH CORONARY ANGIOGRAM;  Surgeon: Dolores Pattyaniel R Bensimhon, MD;  [facility];  Service: Cardiovascular;  Laterality: N/A;  . PERCUTANEOUS CORONARY STENT INTERVENTION (PCI-S) N/A 06/01/2011   Procedure: PERCUTANEOUS CORONARY STENT INTERVENTION (PCI-S);  Surgeon: Peter M SwazilandJordan, MD;  [facility];  Service: Cardiovascular;  Laterality: N/A;  . PERCUTANEOUS CORONARY STENT INTERVENTION (PCI-S) N/A 08/29/2012   Procedure: PERCUTANEOUS CORONARY STENT INTERVENTION (PCI-S);  Surgeon: Peter M SwazilandJordan, MD;  [facility];  Service: Cardiovascular;   Laterality: N/A;  . PERIPHERAL VASCULAR CATHETERIZATION  12/11/2015   Procedure: Upper Extremity Venography;  Surgeon: Duke SalviaSteven C Klein, MD;  [facility];  Service: Cardiovascular;;    Social History   Social History  . Marital status: Legally Separated    Spouse name: N/A  . Number of children: N/A  . Years of education: N/A   Occupational History  . Not on file.   Social History Main Topics  . Smoking status: Former Smoker    Packs/day: 0.50    Years: 44.00    Types: Cigarettes    Quit date: 11/20/2015  . Smokeless tobacco: Never Used  . Alcohol use No     Comment: 08/29/2012 "6-8 beers once/wk". Has not had alcohol in the last 6-8 months  . Drug use: No  . Sexual activity: Not Currently   Other Topics Concern  . Not on file   Social History Narrative   Married; full time.     Family History  Problem Relation  Age of Onset  . Cancer Father   . Cancer Sister     twin sister and only one has cancer, unknown what kind  . Hypertension Brother   . Cancer      family hx  . Heart attack Neg Hx   . Stroke Neg Hx     ROS: no fevers or chills, productive cough, hemoptysis, dysphasia, odynophagia, melena, hematochezia, dysuria, hematuria, rash, seizure activity, orthopnea, PND, pedal edema, claudication. Remaining systems are negative.  Physical Exam: Well-developed well-nourished in no acute distress.  Skin is warm and dry.  HEENT is normal.  Neck is supple.  Chest is clear to auscultation with normal expansion. Pacer site without evidence of infection. Cardiovascular exam is regular rate and rhythm.  Abdominal exam nontender or distended. No masses palpated. Extremities show no edema. neuro grossly intact  A/P  1 ICD-followed by electrophysiology; patient is markedly improved following upgrade of his device. Much of his previous CHF symptoms were likely due to loss of AV synchrony.   2 coronary artery disease-continue aspirin and statin.  3  ischemic cardiomyopathy-continue ARB and beta blocker. I cannot advance medications as his blood pressure is borderline.  4 chronic systolic congestive heart failure-continue present dose of Lasix and spironolactone. Patient is euvolemic on examination. He is now followed in the CHF clinic as he may require advance therapy in the future.  5 hypertension-blood pressure controlled. Continue present medications.  6 hyperlipidemia-continue statin.  7 tobacco abuse-patient counseled on discontinuing.  8 prior TIA-continue aspirin and Plavix.   9 thoracic aortic aneurysm-follow-up CTA August 2018.  Olga MillersBrian Shae Hinnenkamp, MD

## 2016-01-09 ENCOUNTER — Encounter: Payer: Self-pay | Admitting: Cardiology

## 2016-01-09 ENCOUNTER — Ambulatory Visit (INDEPENDENT_AMBULATORY_CARE_PROVIDER_SITE_OTHER): Payer: Self-pay | Admitting: Cardiology

## 2016-01-09 VITALS — BP 90/52 | HR 71 | Ht 71.0 in | Wt 198.0 lb

## 2016-01-09 DIAGNOSIS — I2510 Atherosclerotic heart disease of native coronary artery without angina pectoris: Secondary | ICD-10-CM

## 2016-01-09 DIAGNOSIS — Z95810 Presence of automatic (implantable) cardiac defibrillator: Secondary | ICD-10-CM

## 2016-01-09 DIAGNOSIS — Z720 Tobacco use: Secondary | ICD-10-CM

## 2016-01-09 DIAGNOSIS — I255 Ischemic cardiomyopathy: Secondary | ICD-10-CM

## 2016-01-09 NOTE — Patient Instructions (Signed)
Your physician recommends that you schedule a follow-up appointment in: 3 MONTHS WITH DR CRENSHAW  

## 2016-01-13 ENCOUNTER — Encounter (HOSPITAL_COMMUNITY): Payer: Self-pay

## 2016-01-13 ENCOUNTER — Encounter: Payer: Self-pay | Admitting: Internal Medicine

## 2016-01-13 ENCOUNTER — Ambulatory Visit (HOSPITAL_COMMUNITY)
Admission: RE | Admit: 2016-01-13 | Discharge: 2016-01-13 | Disposition: A | Discharge status: Home / Self Care | Payer: Medicaid Other | Source: Ambulatory Visit | Attending: Cardiology | Admitting: Cardiology

## 2016-01-13 VITALS — BP 110/64 | HR 70 | Wt 200.0 lb

## 2016-01-13 DIAGNOSIS — I2510 Atherosclerotic heart disease of native coronary artery without angina pectoris: Secondary | ICD-10-CM

## 2016-01-13 DIAGNOSIS — I5022 Chronic systolic (congestive) heart failure: Secondary | ICD-10-CM

## 2016-01-13 DIAGNOSIS — Z79899 Other long term (current) drug therapy: Secondary | ICD-10-CM | POA: Diagnosis not present

## 2016-01-13 DIAGNOSIS — Z720 Tobacco use: Secondary | ICD-10-CM

## 2016-01-13 DIAGNOSIS — I712 Thoracic aortic aneurysm, without rupture: Secondary | ICD-10-CM | POA: Diagnosis not present

## 2016-01-13 DIAGNOSIS — Z7982 Long term (current) use of aspirin: Secondary | ICD-10-CM | POA: Insufficient documentation

## 2016-01-13 DIAGNOSIS — Z87891 Personal history of nicotine dependence: Secondary | ICD-10-CM | POA: Insufficient documentation

## 2016-01-13 LAB — BRAIN NATRIURETIC PEPTIDE: B Natriuretic Peptide: 256 pg/mL — ABNORMAL HIGH (ref 0.0–100.0)

## 2016-01-13 LAB — BASIC METABOLIC PANEL
Anion gap: 7 (ref 5–15)
BUN: 23 mg/dL — AB (ref 6–20)
CHLORIDE: 105 mmol/L (ref 101–111)
CO2: 29 mmol/L (ref 22–32)
CREATININE: 1.07 mg/dL (ref 0.61–1.24)
Calcium: 9.7 mg/dL (ref 8.9–10.3)
GFR calc Af Amer: >60 mL/min (ref >60)
GFR calc non Af Amer: >60 mL/min (ref >60)
Glucose, Bld: 81 mg/dL (ref 65–99)
POTASSIUM: 4.3 mmol/L (ref 3.5–5.1)
SODIUM: 141 mmol/L (ref 135–145)

## 2016-01-13 LAB — DIGOXIN LEVEL: Digoxin Level: 0.7 ng/mL — ABNORMAL LOW (ref 0.8–2.0)

## 2016-01-13 MED ORDER — SACUBITRIL-VALSARTAN 24-26 MG PO TABS: 1.0000 | ORAL_TABLET | Freq: Two times a day (BID) | ORAL | 1 refills | Status: DC

## 2016-01-13 NOTE — Progress Notes (Signed)
Cardiology: Dr. Jens Somrenshaw HF Cardiology: [name]  60 yo with history of smoking, CAD s/p multiple PCIs, chronic systolic CHF/ischemic cardiomyopathy presents for HF clinic followup after recent admission.  Last cardiac cath in 9/17 showed stable anatomy, no intervention.  EF has been markedly reduced for several years now.  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 PICC was placed and low cardiac output confirmed.  He was started on milrinone and diuresed.  He had CRT upgrade of his Medtronic device.    Currently doing ok.  No problems walking on flat ground.  No orthopnea/PND.  Mild dyspnea walking up steps. No chest pain.  No orthopnea/PND. He is trying to quit smoking.   Medtronic device interrogation: Several brief atrial tachycardia runs on 01/07/16.   Labs (10/17): hgb 15.8, K 4.3, creatinine 1.11 => 0.98, AST 78 => 43, ALT 105 => 61, digoxin 0.4  PMH: 1. Active smoker: PFTs (6/17) with minimal obstruction on PFTs.  2. Coronary artery disease: Multiple PCIs in the past.  - LHC/RHC (9/17): 85% ostial D2, 99% ostial OM1 (no change from prior) with patent LAD, LCX, OM2, and RCA stents; mean RA 8, PA 58/27 mean 39, mean PCWP 29, CI 2.25, PVR 2.1.  3. Chronic systolic CHF: Ischemic cardiomyopathy.   - Echo (10/17): EF 15%, moderate MR, mild to moderate AI, mildly decreased RV systolic function.  - Medtronic CRT-D device.  - CPX (11/17): peak VO2 20.6, VE/VCO2 slope 38, RER 0.97.  Submaximal, probably mild to moderate HF limitation.  4. 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  5. H/o TIA 6. Ascending aortic aneurysm: 4.1 cm on CTA chest in 8/17.  7. Atrial tachycardia: Noted on device interrogation.   ROS: All systems reviewed and negative except as per HPI.   Social History   Social History  . Marital status: Legally Separated    Spouse name: N/A  . Number of children: N/A   . Years of education: N/A   Occupational History  . Not on file.   Social History Main Topics  . Smoking status: Former Smoker    Packs/day: 0.50    Years: 44.00    Types: Cigarettes    Quit date: 11/20/2015  . Smokeless tobacco: Never Used  . Alcohol use No     Comment: 08/29/2012 "6-8 beers once/wk". Has not had alcohol in the last 6-8 months  . Drug use: No  . Sexual activity: Not Currently   Other Topics Concern  . Not on file   Social History Narrative   Married; full time.    Family History  Problem Relation Age of Onset  . Cancer Father   . Cancer Sister     twin sister and only one has cancer, unknown what kind  . Hypertension Brother   . Cancer      family hx  . Heart attack Neg Hx   . Stroke Neg Hx    Current Outpatient Prescriptions  Medication Sig Dispense Refill  . albuterol (PROAIR HFA) 108 (90 Base) MCG/ACT inhaler Inhale 2 puffs into the lungs every 4 (four) hours as needed for wheezing or shortness of breath.    . ALPRAZolam (XANAX) 0.25 MG tablet Take 1 tablet (0.25 mg total) by mouth 3 (three) times daily as needed for anxiety. 10 tablet 0  . aspirin EC 81 MG tablet Take 81 mg  by mouth daily.    [name] atorvastatin (LIPITOR) 40 MG tablet Take 40 mg by mouth daily.    . bisoprolol (ZEBETA) 5 MG tablet Take 0.5 tablets (2.5 mg total) by mouth at bedtime. 15 tablet 3  . clopidogrel (PLAVIX) 75 MG tablet Take 75 mg by mouth daily.     . clotrimazole-betamethasone (LOTRISONE) cream Apply 1 application topically 2 (two) times daily. 15 g 0  . digoxin (LANOXIN) 0.125 MG tablet Take 1 tablet (0.125 mg total) by mouth daily. 30 tablet 5  . furosemide (LASIX) 40 MG tablet Take 40 mg by mouth See admin instructions. Take 1 tablet (40 mg) by mouth daily, may also take an additional tablet as needed for fluid buildup    . hydrOXYzine (VISTARIL) 25 MG capsule Take 25 mg by mouth at bedtime as needed (sleep).    . nitroGLYCERIN (NITROSTAT) 0.4 MG SL tablet Place 1 tablet  (0.4 mg total) under the tongue every 5 (five) minutes as needed. For chest pain 25 tablet 11  . sacubitril-valsartan (ENTRESTO) 24-26 MG Take 1 tablet by mouth 2 (two) times daily. 60 tablet 1  . spironolactone (ALDACTONE) 25 MG tablet Take 1 tablet (25 mg total) by mouth daily. 30 tablet 5   No current facility-administered medications for this encounter.    BP 110/64 (BP Location: Left Arm, Patient Position: Sitting, Cuff Size: Normal)   Pulse 70   Wt 200 lb (90.7 kg)   SpO2 96%   BMI 27.89 kg/m  General: NAD Neck: No JVD, no thyromegaly or thyroid nodule.  Lungs: Clear to auscultation bilaterally with normal respiratory effort. CV: Nondisplaced PMI.  Heart regular S1/S2, no S3/S4, no murmur.  No peripheral edema.  No carotid bruit.  Normal pedal pulses.  Abdomen: Soft, nontender, no hepatosplenomegaly, no distention.  Skin: Intact without lesions or rashes.  Neurologic: Alert and oriented x 3.  Psych: Normal affect. Extremities: No clubbing or cyanosis.  HEENT: Normal.   Assessment/Plan: 1. Chronic systolic CHF: With low cardiac output noted at last admission in 10/17.  Ischemic cardiomyopathy, had upgrade to Medtronic BiV ICD in 10/17.  Volume looks ok on exam, NYHA class II-III symptoms. CPX was submaximal but suggests mild to moderate HF limitation.  - Continue Lasix 40 mg daily.   - Good BP, will have him stop losartan and start Entresto 24/26 bid.   - Continue spironolactone 25 mg daily.   - Continue digoxin, check level today.  - Continue bisoprolol 2.5 qhs.  - I am concerned that he may be nearing the need for advanced therapies.  Would not be transplant candidate at this time with active smoking.  LVAD would be an option.  Has 3 children and lives with sister.  Will follow his clinical trajectory closely.  - BMET today and again in 10 days.  2.  CAD: Stable, no chest pain. Recent LHC in 9/17 with stable coronary disease.  - Continue ASA 81, Plavix, and statin.  3.  Smoking: Encouraged him to quit. He is trying.  4. 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  Suspect contribution to HF with loss of AV synchrony (and also was bradycardic).  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  He is now s/p BiV upgrade and is a-sensed/v-paced.  5. Ascending aortic aneurysm: 4.1 cm in 8/17. Needs repeat CT in 8/18.  6. Atrial tachycardia: Several episodes noted on device interrogatoin on 11/14.  No changes to meds, continue to follow.  Followup in 1 month.   Marca AnconaDalton Brooke Payes 01/13/2016

## 2016-01-13 NOTE — Patient Instructions (Signed)
STOP Losartan.  START Entresto 24/26mg  tablet twice daily. Use your 30 day free card.  Routine lab work today. Will notify you of abnormal results, otherwise no news is good news!  Return in 10 days for repeat lab work.  Follow up 1 month with [name].  Do the following things EVERYDAY: 1) Weigh yourself in the morning before breakfast. Write it down and keep it in a log. 2) Take your medicines as prescribed 3) Eat low salt foods-Limit salt (sodium) to 2000 mg per day.  4) Stay as active as you can everyday 5) Limit all fluids for the day to less than 2 liters

## 2016-01-15 ENCOUNTER — Telehealth (HOSPITAL_COMMUNITY): Payer: Self-pay | Admitting: Cardiology

## 2016-01-15 DIAGNOSIS — I4729 Other ventricular tachycardia: Secondary | ICD-10-CM

## 2016-01-15 DIAGNOSIS — I472 Ventricular tachycardia: Secondary | ICD-10-CM

## 2016-01-15 NOTE — Telephone Encounter (Signed)
Patient called to report instant decreased in b/p and dizziness. Patient reports he was standing in the kitchen cooking when all of a sudden he felt a wave a dizziness. patient sat down and took b/p (81/55)  Patient reports no recent medication changes, denies SOB, chest pain, visual changes. Patient advised to send manual transmission with Medtronic devise. Will discuss with provider

## 2016-01-15 NOTE — Telephone Encounter (Signed)
May have been the NSVT. Have him come in for BMET and Mg check on Monday.

## 2016-01-15 NOTE — Telephone Encounter (Signed)
Pt aware and agreeable, he was sch for labs 11/30, [facility]

## 2016-01-15 NOTE — Telephone Encounter (Signed)
Pt's episode occurred yesterday 11/21 nad pt quickly recovered back to baseline and states he feels fine today.  Upon review of his transmission he did have an episode of NSVT 11/21 at 12:14 only lasting a second.  Pt had similar episodes on 11/14 as well.  Will send to Dr [name] for his review.

## 2016-01-16 ENCOUNTER — Telehealth: Payer: Self-pay | Admitting: Student

## 2016-01-16 NOTE — Telephone Encounter (Signed)
  The patient called reported a "carmel-colored" oozing coming from his PPM site. Denies any evidence of pus-like drainage. No fevers or chills.   According to the patient, the drainage was minimal and has now ceased. He covered it with a bandage. I asked him to use gauze to form a pressure dressing in the oozing reoccurs.   He has an appointment for labs on Monday. If still having drainage, will have it checked at that time. Is aware if he develops fevers, chills, or pus-like drainage then he needs to be evaluated in the Emergency Department since offices for closed until Monday. Will make EP APP aware for Monday.  Signed, Ellsworth LennoxBrittany M Emree Locicero, PA-C 01/16/2016, 10:30 AM Pager: [phone]

## 2016-01-17 ENCOUNTER — Telehealth: Payer: Self-pay | Admitting: Physician Assistant

## 2016-01-17 NOTE — Telephone Encounter (Signed)
Pt called because his ICD had beeped for a little while and then stopped. He was asymptomatic. He was concerned.   Contacted MDT and pt able to force a download.  This was reviewed by Noreene LarssonJill and she advised his LV lead impedance was high but the device was functioning fine. He had some NSVT remotely, but nothing to explain the recent dizziness.   He needs to be seen in the device clinic on Monday to get his device adjusted.   Contacted pt and advised him he was ok, but device needs a little work.   Pt otherwise doing well.  Theodore DemarkBarrett, [name], PA-C 01/17/2016 4:00 PM Beeper [phone]

## 2016-01-20 ENCOUNTER — Encounter: Payer: Self-pay | Admitting: Licensed Clinical Social Worker

## 2016-01-20 ENCOUNTER — Ambulatory Visit (INDEPENDENT_AMBULATORY_CARE_PROVIDER_SITE_OTHER): Payer: Medicaid Other | Admitting: *Deleted

## 2016-01-20 ENCOUNTER — Ambulatory Visit (HOSPITAL_COMMUNITY)
Admission: RE | Admit: 2016-01-20 | Discharge: 2016-01-20 | Disposition: A | Discharge status: Home / Self Care | Payer: Medicaid Other | Source: Ambulatory Visit | Attending: Cardiology | Admitting: Cardiology

## 2016-01-20 DIAGNOSIS — Z95810 Presence of automatic (implantable) cardiac defibrillator: Secondary | ICD-10-CM

## 2016-01-20 DIAGNOSIS — I5022 Chronic systolic (congestive) heart failure: Secondary | ICD-10-CM | POA: Insufficient documentation

## 2016-01-20 DIAGNOSIS — I472 Ventricular tachycardia: Secondary | ICD-10-CM

## 2016-01-20 DIAGNOSIS — I4729 Other ventricular tachycardia: Secondary | ICD-10-CM

## 2016-01-20 LAB — BASIC METABOLIC PANEL
ANION GAP: 5 (ref 5–15)
BUN: 21 mg/dL — ABNORMAL HIGH (ref 6–20)
CALCIUM: 9.2 mg/dL (ref 8.9–10.3)
CO2: 25 mmol/L (ref 22–32)
Chloride: 107 mmol/L (ref 101–111)
Creatinine, Ser: 0.92 mg/dL (ref 0.61–1.24)
GLUCOSE: 100 mg/dL — AB (ref 65–99)
POTASSIUM: 4.4 mmol/L (ref 3.5–5.1)
SODIUM: 137 mmol/L (ref 135–145)

## 2016-01-20 LAB — MAGNESIUM: MAGNESIUM: 1.9 mg/dL (ref 1.7–2.4)

## 2016-01-20 NOTE — Progress Notes (Signed)
Patient came by the office to share his medicaid application was approved. Patient became very tearful stating how grateful he is [facility]. Patient reports he is feeling so much better and states that he is body is still slow but grateful for the improvement. CSW provided supportive counseling and will continue to be available as needed. Lasandra BeechJackie Giada Schoppe, LCSW [phone]

## 2016-01-20 NOTE — Progress Notes (Signed)
Device checked d/t LV lead impedance warning. LV threshold 1.50V@1 .00ms. LV impedance baseline 850-1000ohms, per JA max LV pacing lead impedance parameter changed from 1000ohms to 1500hms. Incision edges well approximated, no drainage noted. ROV with AS 01/29/2016.

## 2016-01-23 ENCOUNTER — Other Ambulatory Visit (HOSPITAL_COMMUNITY): Payer: Self-pay

## 2016-01-24 ENCOUNTER — Telehealth (HOSPITAL_COMMUNITY): Payer: Self-pay

## 2016-01-24 NOTE — Telephone Encounter (Signed)
Patient called CHF clinic to report dizziness with standing. Only new medication/change is starting nicotine patches. Advised that this may cause dizziness. Patient states he took it off and is now feeling better. Advised to return call to clinic for any further needs and/or return of symptoms. Aware and agreeable to plan as stated above.  Ave FilterBradley, Josefita Weissmann Genevea, RN

## 2016-01-27 ENCOUNTER — Inpatient Hospital Stay (HOSPITAL_COMMUNITY): Payer: Medicaid Other

## 2016-01-27 ENCOUNTER — Ambulatory Visit: Payer: Self-pay | Admitting: Cardiology

## 2016-01-27 ENCOUNTER — Emergency Department (HOSPITAL_COMMUNITY): Payer: Medicaid Other

## 2016-01-27 ENCOUNTER — Encounter (HOSPITAL_COMMUNITY): Payer: Self-pay | Admitting: Emergency Medicine

## 2016-01-27 ENCOUNTER — Inpatient Hospital Stay (HOSPITAL_COMMUNITY)
Admission: EM | Admit: 2016-01-27 | Discharge: 2016-02-02 | DRG: 286 | Disposition: A | Discharge status: Home / Self Care | Payer: Medicaid Other | Attending: Internal Medicine | Admitting: Internal Medicine

## 2016-01-27 DIAGNOSIS — I252 Old myocardial infarction: Secondary | ICD-10-CM

## 2016-01-27 DIAGNOSIS — Z8249 Family history of ischemic heart disease and other diseases of the circulatory system: Secondary | ICD-10-CM

## 2016-01-27 DIAGNOSIS — Z79899 Other long term (current) drug therapy: Secondary | ICD-10-CM

## 2016-01-27 DIAGNOSIS — F4310 Post-traumatic stress disorder, unspecified: Secondary | ICD-10-CM | POA: Diagnosis not present

## 2016-01-27 DIAGNOSIS — I442 Atrioventricular block, complete: Secondary | ICD-10-CM | POA: Diagnosis present

## 2016-01-27 DIAGNOSIS — I472 Ventricular tachycardia: Principal | ICD-10-CM | POA: Diagnosis present

## 2016-01-27 DIAGNOSIS — R509 Fever, unspecified: Secondary | ICD-10-CM | POA: Diagnosis present

## 2016-01-27 DIAGNOSIS — Z8619 Personal history of other infectious and parasitic diseases: Secondary | ICD-10-CM

## 2016-01-27 DIAGNOSIS — Z01818 Encounter for other preprocedural examination: Secondary | ICD-10-CM

## 2016-01-27 DIAGNOSIS — I110 Hypertensive heart disease with heart failure: Secondary | ICD-10-CM | POA: Diagnosis present

## 2016-01-27 DIAGNOSIS — J9601 Acute respiratory failure with hypoxia: Secondary | ICD-10-CM | POA: Diagnosis present

## 2016-01-27 DIAGNOSIS — Z95810 Presence of automatic (implantable) cardiac defibrillator: Secondary | ICD-10-CM | POA: Diagnosis not present

## 2016-01-27 DIAGNOSIS — I4901 Ventricular fibrillation: Secondary | ICD-10-CM | POA: Diagnosis present

## 2016-01-27 DIAGNOSIS — M549 Dorsalgia, unspecified: Secondary | ICD-10-CM | POA: Diagnosis present

## 2016-01-27 DIAGNOSIS — Z7982 Long term (current) use of aspirin: Secondary | ICD-10-CM | POA: Diagnosis not present

## 2016-01-27 DIAGNOSIS — I255 Ischemic cardiomyopathy: Secondary | ICD-10-CM | POA: Diagnosis present

## 2016-01-27 DIAGNOSIS — T82199A Other mechanical complication of unspecified cardiac device, initial encounter: Secondary | ICD-10-CM | POA: Diagnosis not present

## 2016-01-27 DIAGNOSIS — Y711 Therapeutic (nonsurgical) and rehabilitative cardiovascular devices associated with adverse incidents: Secondary | ICD-10-CM | POA: Diagnosis not present

## 2016-01-27 DIAGNOSIS — E876 Hypokalemia: Secondary | ICD-10-CM | POA: Diagnosis present

## 2016-01-27 DIAGNOSIS — E782 Mixed hyperlipidemia: Secondary | ICD-10-CM | POA: Diagnosis present

## 2016-01-27 DIAGNOSIS — Z7902 Long term (current) use of antithrombotics/antiplatelets: Secondary | ICD-10-CM

## 2016-01-27 DIAGNOSIS — I712 Thoracic aortic aneurysm, without rupture: Secondary | ICD-10-CM | POA: Diagnosis present

## 2016-01-27 DIAGNOSIS — Z4502 Encounter for adjustment and management of automatic implantable cardiac defibrillator: Secondary | ICD-10-CM

## 2016-01-27 DIAGNOSIS — R34 Anuria and oliguria: Secondary | ICD-10-CM | POA: Diagnosis present

## 2016-01-27 DIAGNOSIS — J811 Chronic pulmonary edema: Secondary | ICD-10-CM

## 2016-01-27 DIAGNOSIS — I2510 Atherosclerotic heart disease of native coronary artery without angina pectoris: Secondary | ICD-10-CM | POA: Diagnosis not present

## 2016-01-27 DIAGNOSIS — T82855A Stenosis of coronary artery stent, initial encounter: Secondary | ICD-10-CM | POA: Diagnosis present

## 2016-01-27 DIAGNOSIS — T1591XA Foreign body on external eye, part unspecified, right eye, initial encounter: Secondary | ICD-10-CM | POA: Diagnosis present

## 2016-01-27 DIAGNOSIS — I4902 Ventricular flutter: Secondary | ICD-10-CM | POA: Diagnosis present

## 2016-01-27 DIAGNOSIS — I452 Bifascicular block: Secondary | ICD-10-CM | POA: Diagnosis present

## 2016-01-27 DIAGNOSIS — F419 Anxiety disorder, unspecified: Secondary | ICD-10-CM | POA: Diagnosis present

## 2016-01-27 DIAGNOSIS — I5023 Acute on chronic systolic (congestive) heart failure: Secondary | ICD-10-CM | POA: Diagnosis present

## 2016-01-27 DIAGNOSIS — G8929 Other chronic pain: Secondary | ICD-10-CM | POA: Diagnosis present

## 2016-01-27 DIAGNOSIS — F17210 Nicotine dependence, cigarettes, uncomplicated: Secondary | ICD-10-CM | POA: Diagnosis present

## 2016-01-27 DIAGNOSIS — J810 Acute pulmonary edema: Secondary | ICD-10-CM | POA: Diagnosis not present

## 2016-01-27 DIAGNOSIS — Y92230 Patient room in hospital as the place of occurrence of the external cause: Secondary | ICD-10-CM | POA: Diagnosis not present

## 2016-01-27 DIAGNOSIS — E1165 Type 2 diabetes mellitus with hyperglycemia: Secondary | ICD-10-CM | POA: Diagnosis present

## 2016-01-27 DIAGNOSIS — Z8673 Personal history of transient ischemic attack (TIA), and cerebral infarction without residual deficits: Secondary | ICD-10-CM

## 2016-01-27 DIAGNOSIS — W1830XA Fall on same level, unspecified, initial encounter: Secondary | ICD-10-CM | POA: Diagnosis present

## 2016-01-27 LAB — COMPREHENSIVE METABOLIC PANEL
ALK PHOS: 43 U/L (ref 38–126)
ALT: 36 U/L (ref 17–63)
AST: 37 U/L (ref 15–41)
Albumin: 3.8 g/dL (ref 3.5–5.0)
Anion gap: 11 (ref 5–15)
BILIRUBIN TOTAL: 1.1 mg/dL (ref 0.3–1.2)
BUN: 14 mg/dL (ref 6–20)
CHLORIDE: 107 mmol/L (ref 101–111)
CO2: 22 mmol/L (ref 22–32)
Calcium: 9.4 mg/dL (ref 8.9–10.3)
Creatinine, Ser: 1.19 mg/dL (ref 0.61–1.24)
GLUCOSE: 144 mg/dL — AB (ref 65–99)
Potassium: 3.4 mmol/L — ABNORMAL LOW (ref 3.5–5.1)
Sodium: 140 mmol/L (ref 135–145)
Total Protein: 7.3 g/dL (ref 6.5–8.1)

## 2016-01-27 LAB — MRSA PCR SCREENING: MRSA by PCR: NEGATIVE

## 2016-01-27 LAB — CBC
HCT: 40.6 % (ref 39.0–52.0)
Hemoglobin: 13.6 g/dL (ref 13.0–17.0)
MCH: 29.7 pg (ref 26.0–34.0)
MCHC: 33.5 g/dL (ref 30.0–36.0)
MCV: 88.6 fL (ref 78.0–100.0)
PLATELETS: 219 10*3/uL (ref 150–400)
RBC: 4.58 MIL/uL (ref 4.22–5.81)
RDW: 15.2 % (ref 11.5–15.5)
WBC: 6.9 10*3/uL (ref 4.0–10.5)

## 2016-01-27 LAB — MAGNESIUM: MAGNESIUM: 1.7 mg/dL (ref 1.7–2.4)

## 2016-01-27 LAB — APTT: aPTT: 31 seconds (ref 24–36)

## 2016-01-27 LAB — DIGOXIN LEVEL

## 2016-01-27 LAB — BRAIN NATRIURETIC PEPTIDE: B Natriuretic Peptide: 595.7 pg/mL — ABNORMAL HIGH (ref 0.0–100.0)

## 2016-01-27 LAB — I-STAT TROPONIN, ED: TROPONIN I, POC: 0.22 ng/mL — AB (ref 0.00–0.08)

## 2016-01-27 LAB — TROPONIN I: TROPONIN I: 1.89 ng/mL — AB (ref <0.03)

## 2016-01-27 MED ORDER — ALBUTEROL SULFATE (2.5 MG/3ML) 0.083% IN NEBU: 2.5000 mg | INHALATION_SOLUTION | RESPIRATORY_TRACT | Status: DC | PRN

## 2016-01-27 MED ORDER — ONDANSETRON HCL 4 MG/2ML IJ SOLN
4.0000 mg | Freq: Four times a day (QID) | INTRAMUSCULAR | Status: DC | PRN
  Administered 2016-02-01: 4 mg via INTRAVENOUS
  Filled 2016-01-27: qty 2

## 2016-01-27 MED ORDER — DEXMEDETOMIDINE HCL IN NACL 200 MCG/50ML IV SOLN
0.0000 ug/kg/h | INTRAVENOUS | Status: DC
  Administered 2016-01-27: 0.6 ug/kg/h via INTRAVENOUS
  Administered 2016-01-27: 0.4 ug/kg/h via INTRAVENOUS
  Administered 2016-01-27: 0.9 ug/kg/h via INTRAVENOUS
  Administered 2016-01-27: 0.6 ug/kg/h via INTRAVENOUS
  Administered 2016-01-27 – 2016-01-29 (×11): 0.9 ug/kg/h via INTRAVENOUS
  Administered 2016-01-29: 0.7 ug/kg/h via INTRAVENOUS
  Administered 2016-01-29: 0.9 ug/kg/h via INTRAVENOUS
  Filled 2016-01-27 (×20): qty 50

## 2016-01-27 MED ORDER — FLUORESCEIN SODIUM 1 MG OP STRP
1.0000 | ORAL_STRIP | Freq: Once | OPHTHALMIC | Status: DC
Start: 2016-01-27 — End: 2016-02-02

## 2016-01-27 MED ORDER — AMIODARONE LOAD VIA INFUSION
150.0000 mg | Freq: Once | INTRAVENOUS | Status: AC
  Administered 2016-01-27: 150 mg via INTRAVENOUS
  Filled 2016-01-27: qty 83.34

## 2016-01-27 MED ORDER — MIDAZOLAM HCL 2 MG/2ML IJ SOLN
INTRAMUSCULAR | Status: AC
  Filled 2016-01-27: qty 2

## 2016-01-27 MED ORDER — FENTANYL CITRATE (PF) 100 MCG/2ML IJ SOLN
INTRAMUSCULAR | Status: AC | PRN
  Administered 2016-01-27: 50 ug via INTRAVENOUS

## 2016-01-27 MED ORDER — AMIODARONE HCL IN DEXTROSE 360-4.14 MG/200ML-% IV SOLN
30.0000 mg/h | INTRAVENOUS | Status: DC
  Administered 2016-01-27 – 2016-01-31 (×9): 30 mg/h via INTRAVENOUS
  Filled 2016-01-27 (×8): qty 200

## 2016-01-27 MED ORDER — NITROGLYCERIN 0.4 MG SL SUBL: 0.4000 mg | SUBLINGUAL_TABLET | SUBLINGUAL | Status: DC | PRN

## 2016-01-27 MED ORDER — SODIUM CHLORIDE 0.9 % IV SOLN
30.0000 meq | Freq: Once | INTRAVENOUS | Status: AC
  Administered 2016-01-27: 30 meq via INTRAVENOUS
  Filled 2016-01-27: qty 15

## 2016-01-27 MED ORDER — CLOPIDOGREL BISULFATE 75 MG PO TABS
75.0000 mg | ORAL_TABLET | Freq: Every day | ORAL | Status: DC
  Administered 2016-01-28 – 2016-02-02 (×6): 75 mg via ORAL
  Filled 2016-01-27 (×6): qty 1

## 2016-01-27 MED ORDER — FENTANYL CITRATE (PF) 100 MCG/2ML IJ SOLN
100.0000 ug | INTRAMUSCULAR | Status: DC | PRN
  Administered 2016-01-27 (×2): 100 ug via INTRAVENOUS
  Filled 2016-01-27 (×2): qty 2

## 2016-01-27 MED ORDER — ATORVASTATIN CALCIUM 40 MG PO TABS
40.0000 mg | ORAL_TABLET | Freq: Every day | ORAL | Status: DC
  Administered 2016-01-28 – 2016-02-02 (×6): 40 mg via ORAL
  Filled 2016-01-27 (×6): qty 1

## 2016-01-27 MED ORDER — FAMOTIDINE IN NACL 20-0.9 MG/50ML-% IV SOLN
20.0000 mg | Freq: Two times a day (BID) | INTRAVENOUS | Status: DC
  Administered 2016-01-27 – 2016-01-31 (×8): 20 mg via INTRAVENOUS
  Filled 2016-01-27 (×8): qty 50

## 2016-01-27 MED ORDER — DIGOXIN 125 MCG PO TABS: 0.1250 mg | ORAL_TABLET | Freq: Every day | ORAL | Status: DC

## 2016-01-27 MED ORDER — DEXTROSE 5 % IV SOLN
0.0000 ug/min | INTRAVENOUS | Status: DC
  Filled 2016-01-27: qty 1

## 2016-01-27 MED ORDER — MAGNESIUM SULFATE 50 % IJ SOLN
INTRAMUSCULAR | Status: AC | PRN
  Administered 2016-01-27 (×2): 2 g via INTRAVENOUS

## 2016-01-27 MED ORDER — FENTANYL 2500MCG IN NS 250ML (10MCG/ML) PREMIX INFUSION
25.0000 ug/h | INTRAVENOUS | Status: DC
  Administered 2016-01-27: 50 ug/h via INTRAVENOUS
  Administered 2016-01-29: 75 ug/h via INTRAVENOUS
  Filled 2016-01-27 (×2): qty 250

## 2016-01-27 MED ORDER — NOREPINEPHRINE BITARTRATE 1 MG/ML IV SOLN
2.0000 ug/min | INTRAVENOUS | Status: DC
  Administered 2016-01-27: 6 ug/min via INTRAVENOUS
  Administered 2016-01-27: 20 ug/min via INTRAVENOUS
  Administered 2016-01-28: 5 ug/min via INTRAVENOUS
  Administered 2016-01-29: 3 ug/min via INTRAVENOUS
  Filled 2016-01-27 (×4): qty 4

## 2016-01-27 MED ORDER — ETOMIDATE 2 MG/ML IV SOLN
INTRAVENOUS | Status: AC | PRN
  Administered 2016-01-27: 20 mg via INTRAVENOUS

## 2016-01-27 MED ORDER — ASPIRIN EC 81 MG PO TBEC: 81.0000 mg | DELAYED_RELEASE_TABLET | Freq: Every day | ORAL | Status: DC

## 2016-01-27 MED ORDER — NOREPINEPHRINE BITARTRATE 1 MG/ML IV SOLN
0.0000 ug/min | INTRAVENOUS | Status: DC
  Filled 2016-01-27: qty 4

## 2016-01-27 MED ORDER — SPIRONOLACTONE 25 MG PO TABS: 25.0000 mg | ORAL_TABLET | Freq: Every day | ORAL | Status: DC

## 2016-01-27 MED ORDER — BISOPROLOL FUMARATE 5 MG PO TABS: 2.5000 mg | ORAL_TABLET | Freq: Every day | ORAL | Status: DC

## 2016-01-27 MED ORDER — SACUBITRIL-VALSARTAN 24-26 MG PO TABS: 1.0000 | ORAL_TABLET | Freq: Two times a day (BID) | ORAL | Status: DC

## 2016-01-27 MED ORDER — FUROSEMIDE 40 MG PO TABS: 40.0000 mg | ORAL_TABLET | Freq: Every day | ORAL | Status: DC

## 2016-01-27 MED ORDER — MIDAZOLAM HCL 2 MG/2ML IJ SOLN
INTRAMUSCULAR | Status: AC
  Administered 2016-01-27: 2 mg via INTRAVENOUS
  Filled 2016-01-27: qty 2

## 2016-01-27 MED ORDER — ACETAMINOPHEN 325 MG PO TABS
650.0000 mg | ORAL_TABLET | ORAL | Status: DC | PRN
  Administered 2016-01-29: 650 mg via ORAL
  Filled 2016-01-27: qty 2

## 2016-01-27 MED ORDER — ROCURONIUM BROMIDE 50 MG/5ML IV SOLN
INTRAVENOUS | Status: AC | PRN
  Administered 2016-01-27: 60 mg via INTRAVENOUS

## 2016-01-27 MED ORDER — LIDOCAINE IN D5W 4-5 MG/ML-% IV SOLN
1.0000 mg/min | INTRAVENOUS | Status: DC
  Administered 2016-01-27 – 2016-01-30 (×3): 1 mg/min via INTRAVENOUS
  Filled 2016-01-27 (×4): qty 500

## 2016-01-27 MED ORDER — ENOXAPARIN SODIUM 40 MG/0.4ML ~~LOC~~ SOLN: 40.0000 mg | SUBCUTANEOUS | Status: DC

## 2016-01-27 MED ORDER — MIDAZOLAM HCL 2 MG/2ML IJ SOLN
2.0000 mg | Freq: Once | INTRAMUSCULAR | Status: AC
  Administered 2016-01-27: 2 mg via INTRAVENOUS

## 2016-01-27 MED ORDER — LIDOCAINE BOLUS VIA INFUSION
100.0000 mg | Freq: Once | INTRAVENOUS | Status: AC
  Administered 2016-01-27: 100 mg via INTRAVENOUS
  Filled 2016-01-27 (×2): qty 100

## 2016-01-27 MED ORDER — AMIODARONE HCL IN DEXTROSE 360-4.14 MG/200ML-% IV SOLN
INTRAVENOUS | Status: AC
  Filled 2016-01-27: qty 200

## 2016-01-27 MED ORDER — ALBUTEROL SULFATE HFA 108 (90 BASE) MCG/ACT IN AERS: 2.0000 | INHALATION_SPRAY | RESPIRATORY_TRACT | Status: DC | PRN

## 2016-01-27 MED ORDER — PROPOFOL 10 MG/ML IV BOLUS
INTRAVENOUS | Status: AC
  Administered 2016-01-27: 40 mg
  Filled 2016-01-27: qty 20

## 2016-01-27 MED ORDER — LORAZEPAM 2 MG/ML IJ SOLN
0.5000 mg | Freq: Once | INTRAMUSCULAR | Status: AC
  Administered 2016-01-27: 0.5 mg via INTRAVENOUS
  Filled 2016-01-27: qty 1

## 2016-01-27 MED ORDER — AMIODARONE HCL IN DEXTROSE 360-4.14 MG/200ML-% IV SOLN
60.0000 mg/h | INTRAVENOUS | Status: AC
  Administered 2016-01-27 (×2): 150 mg/h via INTRAVENOUS
  Administered 2016-01-27: 60 mg/h via INTRAVENOUS
  Filled 2016-01-27 (×3): qty 200

## 2016-01-27 MED ORDER — FENTANYL CITRATE (PF) 100 MCG/2ML IJ SOLN
INTRAMUSCULAR | Status: AC
Start: 2016-01-27 — End: 2016-01-28
  Filled 2016-01-27: qty 2

## 2016-01-27 MED ORDER — MIDAZOLAM HCL 2 MG/2ML IJ SOLN
1.0000 mg | INTRAMUSCULAR | Status: DC | PRN
  Administered 2016-01-27 – 2016-01-29 (×2): 2 mg via INTRAVENOUS
  Filled 2016-01-27 (×2): qty 2

## 2016-01-27 MED ORDER — ASPIRIN EC 81 MG PO TBEC
81.0000 mg | DELAYED_RELEASE_TABLET | Freq: Every day | ORAL | Status: DC
  Administered 2016-01-28 – 2016-02-02 (×6): 81 mg via ORAL
  Filled 2016-01-27 (×6): qty 1

## 2016-01-27 MED ORDER — FENTANYL CITRATE (PF) 100 MCG/2ML IJ SOLN: 100.0000 ug | INTRAMUSCULAR | Status: DC | PRN

## 2016-01-27 MED FILL — Medication: Qty: 1 | Status: AC

## 2016-01-27 NOTE — H&P (Signed)
H&P    Patient ID: [name] MRN: [id number], DOB/AGE: January 24, 1956 60 y.o.  Admit date: 01/27/2016 Date of Consult: 01/27/2016   Primary Physician: Tula Nakayama, MD Primary Cardiologist: Dr. Shirlee Latch Electrophysiologist: Dr. Graciela Husbands  Reason for Consultation: multiple ICD shocks  HPI: [name] is a 60 y.o. male with PMHx of CAD, ICM with intermittent CHB, RBBB, upgrade to CRT device in October, chronic CHF, active smoker, HTN.  He reports that over the weekend he had a couple of episodes of dizziness, attributed tohis nicotine patch, better after taking it off.  He was at "the shop" getting ready to go intoa shed when he started feeling weak and near syncopal, felt hiself get shocked, and sounds like was in-out feeling himself get shocked about 5 times he thinks, reportedly 5 witnessed by EMS as well.  Interrogation shows 41 total shocks reviewed with Dr. Graciela Husbands, appears VFlutter that was shocked and degenerated into VF.  All are appropriate.  He reports he had been feeling "fine" until then otherwise with no anginal type symptoms.   LABS K+ 3.4 Mag 1.7 Dig <0.2 BUN/Creat 14/1.19 LFTs wnl BNP 595 H/H 13/40 plts 219 WBC 6.9   DEVICE HISTORY: MDT CTP-ICD upgrade 12/11/15, original device was a single lead device implanted originally 2004, gen change 2010 + appropriate therapy for VFlutter in 2103   Past Medical History:  Diagnosis Date  . Anxiety   . Automatic implantable cardioverter-defibrillator in situ    a. Medtronic ICD.  [name] CAD (coronary artery disease)    a.  Severe LAD stenosis 2/2 acute thrombus - BMS 2009;  b. 06/01/11 Cath - LAD 20 isr, 76m (3.0x16 Veri-flex BMS), OM1 100p, EF 20-25%;  c. 07/2012 Abnl Cardiolite;  d. 08/2012 Cath/PCI: LM nl, LAD patent stents, D2 80-90 (jailed), LCX 90 p/m (4.0x24 Promus Premier DES), OM1 100, OM2 80-90p (3.0x20 Promus Premier DES), RCA patent mid stent, 40d, EF 25%.  e. 10/29/15 LHC stable disease with patent stents  .  Chronic systolic CHF (congestive heart failure), NYHA class 3 (HCC) - low output state 12/12/2014  . Depression   . Dyspnea   . History of rheumatic fever 1962  . HLD (hyperlipidemia) mixed  . HTN (hypertension)   . Ischemic cardiomyopathy    a.  EF 30-35% 2010;  b.  EF 20-25% by LV gram 08/2012. c. EF 15% by echo 11/2014.  [name] Myocardial infarction 1998; 2002; ~ 2010  . Paroxysmal ventricular tachycardia (HCC)    a. VFlutter  CL 210 msec  Rx shock 04/2011  . PVC's (premature ventricular contractions)   . RBBB   . TIA (transient ischemic attack)    a. Dx 11/2014, continued on ASA/Plavix.     Surgical History:  Past Surgical History:  Procedure Laterality Date  . CARDIAC CATHETERIZATION N/A 10/29/2015   Procedure: Right/Left Heart Cath and Coronary Angiography;  Surgeon: Peter M Swaziland, MD;  Location: [facility] INVASIVE CV LAB;  Service: Cardiovascular;  Laterality: N/A;  . CARDIAC DEFIBRILLATOR PLACEMENT     MDT single chamber primary prevention ICD implanted by Dr Graciela Husbands as part of MASTER study  . EP IMPLANTABLE DEVICE N/A 12/11/2015   Procedure: BiV Upgrade;  Surgeon: [name], MD;  Location: [facility] INVASIVE CV LAB;  Service: Cardiovascular;  Laterality: N/A;  . EP IMPLANTABLE DEVICE N/A 12/11/2015   Procedure: Pocket Revision/Relocation;  Surgeon: [name], MD;  Location: [facility] INVASIVE CV LAB;  Service: Cardiovascular;  Laterality: N/A;  . EP IMPLANTABLE DEVICE  N/A 12/11/2015   Procedure: Lead Revision/Repair;  Surgeon: Duke SalviaSteven C Delynn Pursley, MD;  [facility] LAB;  Service: Cardiovascular;  Laterality: N/A;  . HERNIA REPAIR     "w/gallbladder OR" (08/29/2012)  . LAPAROSCOPIC CHOLECYSTECTOMY    . LEFT HEART CATHETERIZATION WITH CORONARY ANGIOGRAM N/A 06/08/2011   Procedure: LEFT HEART CATHETERIZATION WITH CORONARY ANGIOGRAM;  Surgeon: Dolores Pattyaniel R Bensimhon, MD;  [facility];  Service: Cardiovascular;  Laterality: N/A;  . PERCUTANEOUS CORONARY STENT INTERVENTION (PCI-S) N/A  06/01/2011   Procedure: PERCUTANEOUS CORONARY STENT INTERVENTION (PCI-S);  Surgeon: Peter M SwazilandJordan, MD;  [facility];  Service: Cardiovascular;  Laterality: N/A;  . PERCUTANEOUS CORONARY STENT INTERVENTION (PCI-S) N/A 08/29/2012   Procedure: PERCUTANEOUS CORONARY STENT INTERVENTION (PCI-S);  Surgeon: Peter M SwazilandJordan, MD;  [facility];  Service: Cardiovascular;  Laterality: N/A;  . PERIPHERAL VASCULAR CATHETERIZATION  12/11/2015   Procedure: Upper Extremity Venography;  Surgeon: Duke SalviaSteven C Mija Effertz, MD;  [facility];  Service: Cardiovascular;;      (Not in a hospital admission)  Inpatient Medications:   Allergies: No Known Allergies  Social History   Social History  . Marital status: Legally Separated    Spouse name: N/A  . Number of children: N/A  . Years of education: N/A   Occupational History  . Not on file.   Social History Main Topics  . Smoking status: Former Smoker    Packs/day: 0.50    Years: 44.00    Types: Cigarettes    Quit date: 11/20/2015  . Smokeless tobacco: Never Used  . Alcohol use No     Comment: 08/29/2012 "6-8 beers once/wk". Has not had alcohol in the last 6-8 months  . Drug use: No  . Sexual activity: Not Currently   Other Topics Concern  . Not on file   Social History Narrative   Married; full time.      Family History  Problem Relation Age of Onset  . Cancer Father   . Cancer Sister     twin sister and only one has cancer, unknown what kind  . Hypertension Brother   . Cancer      family hx  . Heart attack Neg Hx   . Stroke Neg Hx      Review of Systems: All other systems reviewed and are otherwise negative except as noted above.  Physical Exam: Vitals:   01/27/16 1217  BP: 130/76  Pulse: 102  Resp: 18  Temp: 97.4 F (36.3 C)  TempSrc: Oral  SpO2: 100%    GEN- The patient is well appearing, alert and oriented x 3 today.   HEENT: normocephalic, small abrasions R eyebrow, left cheek ; sclera clear,  conjunctiva pink; hearing intact; oropharynx clear; neck supple, no JVP Lymph- no cervical lymphadenopathy Lungs- Clear to ausculation bilaterally, normal work of breathing.  No wheezes, rales, rhonchi Heart- Regular rate and rhythm, no murmurs, rubs or gallops, PMI not laterally displaced GI- soft, non-tender, non-distended Extremities- no clubbing, cyanosis, or edema MS- no significant deformity or atrophy Skin- warm and dry, no rash, abrasion R knee, ICD site has some fluctuation at the pocket Psych- euthymic mood, full affect Neuro- no gross deficits observed  Labs:   Lab Results  Component Value Date   WBC 10.6 (H) 12/16/2015   HGB 15.8 12/16/2015   HCT 45.6 12/16/2015   MCV 89.6 12/16/2015   PLT 271 12/16/2015   No results for input(s): NA, K, CL, CO2, BUN,  CREATININE, CALCIUM, PROT, BILITOT, ALKPHOS, ALT, AST, GLUCOSE in the last 168 hours.  Invalid input(s): LABALBU    Radiology/Studies:  Dg Chest Port 1 View Result Date: 01/27/2016 CLINICAL DATA:  Evaluate pacemaker leads.ICD firing. EXAM: PORTABLE CHEST 1 VIEW COMPARISON:  12/16/2015 FINDINGS: Chronic cardiopericardial enlargement. Status post coronary stenting. Biventricular ICD/pacer from the left with leads in stable position. Mild basilar interstitial crowding. There is no edema, consolidation, effusion, or pneumothorax. IMPRESSION: Biventricular ICD/ pacer has a stable appearance since 12/16/2015. Electronically Signed   By: Marnee SpringJonathon  Watts M.D.   On: 01/27/2016 12:10    EKG: SR, V paced F/u is SR, bigeminal rhythm TELEMETRY: SR, Vpaced  LHC/RHC (9/17): 85% ostial D2, 99% ostial OM1 (no change from prior) with patent LAD, LCX, OM2, and RCA stents; mean RA 8, PA 58/27 mean 39, mean PCWP 29, CI 2.25, PVR 2.1.  Echo (10/17): EF 15%, moderate MR, mild to moderate AI, mildly decreased RV systolic function.   Assessment and Plan:   1. Syncope/VT/VF storm     Admit to cardiac ICU     The patient received 41  appropriate shocks today for VFlutter/VF     Device has been interrogated and the patient seen by Dr. Graciela HusbandsKlein     Amiodarone and lidocaine gtt's ordered     Device reprogrammed LV lead off with concerns of pre-arrhythmia     While in ED recurrent VT/VF received ICD tx x3, restoring SR     2g mag given     Lidocaine received bolus given and started gtt     Further VT 160's     Re-bolus amiodarone  2. No prior complaints of CP     Low suspicion of coronary event  3. R eye pain     Exacerbation of chronic back pain     Anxiety post syncope/shocks     Internal medicine will aid with w/u of these and management  4. ICM     Exam appears euvolemic     No c/o SOB, in-fact had improved    Signed, Francis DowseRenee Ursuy, PA-C 01/27/2016 12:20 PM   Seen  and examined Patient with ischemic cardiomyopathy presents with VT storm secondary to ventricular flutter. Multiple episodes and required multiple shocks. He is received greater than 40 shocks. Reviewing prior device history, before CRT upgrade 10/17 he had only an isolated episode 2013 and 2015. This raises the specter of CRT proarrhythmia. The literature has identified this to occur up to 30 days following implanted. We are currently at about 46 days. This may make this less likely as the explanation. However, functional status has been stable. He was seen last week by Dr. DM. The case is been discussed with him. He has had no symptoms to suggest ischemia although this is another potential culprit. Hence, we had turned off his LV lead. Because of recurring shocks this afternoon we have asked for help from CCM to intubate him onto a get his rhythm under control. We're using amiodarone and lidocaine. Electrolytes were within normal ranges pretty much left (K1.4 magnesium 1.7)  His prognosis is very worrisome at this point with his baseline ejection fraction 15%, we may well end up with shock as a consequence of multiple therapies. In discussion with CCM we  will use norepinephrine as opposed to Neo-Synephrine for blood pressure support  Have reviewed with his family.

## 2016-01-27 NOTE — ED Notes (Signed)
Dr. Jamey Reaslein at bedside.

## 2016-01-27 NOTE — Code Documentation (Signed)
Pt noted to be in Vtach at rate of 160 bpm, patient continues to be alert and speaking. Pulses weak, but present.

## 2016-01-27 NOTE — ED Notes (Signed)
CCM at bedside electing to intubate. RT notified and present.

## 2016-01-27 NOTE — Code Documentation (Signed)
Pt rhythm still Vtach at 160-180, MD Knott at bedside recommending cardioversion due to patient becoming pale, with weak pulses. Verbal order given for 40 mg propofol.

## 2016-01-27 NOTE — ED Notes (Addendum)
Pt noted to be in vtach rhythm on monitor, pulses present. MD Clydene PughKnott at bedside. Pt able to speak with us. On zoll. BP 129/86.

## 2016-01-27 NOTE — Code Documentation (Signed)
After amio bolus patient converted out of Vtach. BBB noted on monitor with rate of 90. Pt arousable but remains somewhat sedated from 40 mg propofol push, pulses strong.

## 2016-01-27 NOTE — Procedures (Signed)
Intubation Procedure Note [name] [id number] 10/10/1955  Procedure: Intubation Indications: Airway protection and maintenance  Procedure Details Consent: Risks of procedure as well as the alternatives and risks of each were explained to the (patient/caregiver).  Consent for procedure obtained. Time Out: Verified patient identification, verified procedure, site/side was marked, verified correct patient position, special equipment/implants available, medications/allergies/relevent history reviewed, required imaging and test results available.  Performed  Maximum sterile technique was used including antiseptics, cap, gloves, hand hygiene and mask.  MAC and 4    Evaluation Hemodynamic Status: BP stable throughout; O2 sats: stable throughout Patient's Current Condition: stable Complications: No apparent complications Patient did tolerate procedure well. Chest X-ray ordered to verify placement.  CXR: pending.  Simonne MartinetPeter E Babcock ACNP-BC [facility]Critical [facility] Pager # [phone] OR # [phone] if no answer  Shelby Mattocksete E Babcock 01/27/2016

## 2016-01-27 NOTE — ED Notes (Signed)
Dr. Klein at bedside 

## 2016-01-27 NOTE — Code Documentation (Addendum)
Pt suddenly showing v fib on monitor, PA Renee with cardiology at bedside as well as EDP MD Clydene PughKnott, patient AICD fired x2 during this event with success. Pt remains alert. Given verbal order from EDP to give 2 g magnesium IV push.

## 2016-01-27 NOTE — ED Notes (Signed)
Given verbal order to bolus patient an additional 150 mg amiodorone at this time. Cardiology made aware of patient current event of vtach at 160.

## 2016-01-27 NOTE — ED Provider Notes (Signed)
[facility]-EMERGENCY DEPT Provider Note   CSN: [id number] Arrival date & time:        History   Chief Complaint Chief Complaint  Patient presents with  . Loss of Consciousness  . AICD Problem    HPI [name] is a 60 y.o. male.  HPI Patient is a 60 year old male with past medical history significant for ICM, s/p AICD placement who presents after multiple defibrillator shocks today. Patient reports he was working on his motorcycle when he felt lightheaded and had 2 syncopal episodes. He fell from standing to the ground. He subsequently felt a prodrome of chest pain, palpitations, lightheaded, and felt his device fire 5 separate times. EMS was called. He experienced 5 additional shocks with EMS. Rhythm strip obtained prior to arrival shows likely V. Tach. Patient currently denies headache, short of breath, chest pain or other symptoms.   Past Medical History:  Diagnosis Date  . Anxiety   . Automatic implantable cardioverter-defibrillator in situ    a. Medtronic ICD.  [name]. CAD (coronary artery disease)    a.  Severe LAD stenosis 2/2 acute thrombus - BMS 2009;  b. 06/01/11 Cath - LAD 20 isr, 449m (3.0x16 Veri-flex BMS), OM1 100p, EF 20-25%;  c. 07/2012 Abnl Cardiolite;  d. 08/2012 Cath/PCI: LM nl, LAD patent stents, D2 80-90 (jailed), LCX 90 p/m (4.0x24 Promus Premier DES), OM1 100, OM2 80-90p (3.0x20 Promus Premier DES), RCA patent mid stent, 40d, EF 25%.  e. 10/29/15 LHC stable disease with patent stents  . Chronic systolic CHF (congestive heart failure), NYHA class 3 (HCC) - low output state 12/12/2014  . Depression   . Dyspnea   . History of rheumatic fever 1962  . HLD (hyperlipidemia) mixed  . HTN (hypertension)   . Ischemic cardiomyopathy    a.  EF 30-35% 2010;  b.  EF 20-25% by LV gram 08/2012. c. EF 15% by echo 11/2014.  [name]. Myocardial infarction 1998; 2002; ~ 2010  . Paroxysmal ventricular tachycardia (HCC)    a. VFlutter  CL 210 msec  Rx shock 04/2011  . PVC's (premature ventricular  contractions)   . RBBB   . TIA (transient ischemic attack)    a. Dx 11/2014, continued on ASA/Plavix.    Patient Active Problem List   Diagnosis Date Noted  . VF (ventricular fibrillation) (HCC) 01/27/2016  . Encounter for intubation   . Acute respiratory failure with hypoxia (HCC)   . Hypokalemia   . DOE (dyspnea on exertion) 06/19/2015  . Acute systolic congestive heart failure, NYHA class 3 (HCC)   . Status post implantation of automatic cardioverter/defibrillator (AICD) - MDT   . H/O medication noncompliance   . Acute kidney insufficiency   . Chest pain at rest   . Depression 04/09/2015  . Acute on chronic systolic CHF (congestive heart failure) (HCC) 04/09/2015  . Erectile dysfunction 04/09/2015  . RBBB   . PVC's (premature ventricular contractions)   . Chronic systolic CHF (congestive heart failure), NYHA class 3 (HCC) - low output state 12/12/2014  . Generalized anxiety disorder 12/09/2014  . Major depressive disorder, recurrent episode (HCC) 12/09/2014  . TIA (transient ischemic attack)   . HLD (hyperlipidemia)   . Aphasia 05/04/2014  . Unstable angina (HCC) 06/02/2011  . CAD (coronary artery disease)   . Ischemic cardiomyopathy   . Automatic implantable cardioverter-defibrillator in situ   . TOBACCO ABUSE 02/04/2010  . VENTRICULAR TACHYCARDIA 08/21/2008  . HYPERLIPIDEMIA-MIXED 06/04/2008  . ANXIETY 06/04/2008  . Essential hypertension 06/04/2008  .  Cardiomyopathy, ischemic 06/04/2008  . CHEST PAIN-UNSPECIFIED 06/04/2008    Past Surgical History:  Procedure Laterality Date  . CARDIAC CATHETERIZATION N/A 10/29/2015   Procedure: Right/Left Heart Cath and Coronary Angiography;  Surgeon: Peter M Swaziland, MD;  Location: [facility] INVASIVE CV LAB;  Service: Cardiovascular;  Laterality: N/A;  . CARDIAC DEFIBRILLATOR PLACEMENT     MDT single chamber primary prevention ICD implanted by Dr Graciela Husbands as part of MASTER study  . EP IMPLANTABLE DEVICE N/A 12/11/2015   Procedure: BiV  Upgrade;  Surgeon: [name], MD;  Location: [facility] INVASIVE CV LAB;  Service: Cardiovascular;  Laterality: N/A;  . EP IMPLANTABLE DEVICE N/A 12/11/2015   Procedure: Pocket Revision/Relocation;  Surgeon: [name], MD;  Location: [facility] INVASIVE CV LAB;  Service: Cardiovascular;  Laterality: N/A;  . EP IMPLANTABLE DEVICE N/A 12/11/2015   Procedure: Lead Revision/Repair;  Surgeon: [name], MD;  Location: [facility] INVASIVE CV LAB;  Service: Cardiovascular;  Laterality: N/A;  . HERNIA REPAIR     "w/gallbladder OR" (08/29/2012)  . LAPAROSCOPIC CHOLECYSTECTOMY    . LEFT HEART CATHETERIZATION WITH CORONARY ANGIOGRAM N/A 06/08/2011   Procedure: LEFT HEART CATHETERIZATION WITH CORONARY ANGIOGRAM;  Surgeon: Dolores Patty, MD;  Location: [facility] CATH LAB;  Service: Cardiovascular;  Laterality: N/A;  . PERCUTANEOUS CORONARY STENT INTERVENTION (PCI-S) N/A 06/01/2011   Procedure: PERCUTANEOUS CORONARY STENT INTERVENTION (PCI-S);  Surgeon: Peter M Swaziland, MD;  Location: [facility] CATH LAB;  Service: Cardiovascular;  Laterality: N/A;  . PERCUTANEOUS CORONARY STENT INTERVENTION (PCI-S) N/A 08/29/2012   Procedure: PERCUTANEOUS CORONARY STENT INTERVENTION (PCI-S);  Surgeon: Peter M Swaziland, MD;  Location: [facility] CATH LAB;  Service: Cardiovascular;  Laterality: N/A;  . PERIPHERAL VASCULAR CATHETERIZATION  12/11/2015   Procedure: Upper Extremity Venography;  Surgeon: [name], MD;  Location: [facility] INVASIVE CV LAB;  Service: Cardiovascular;;       Home Medications    Prior to Admission medications   Medication Sig Start Date End Date Taking? Authorizing Provider  albuterol (PROAIR HFA) 108 (90 Base) MCG/ACT inhaler Inhale 2 puffs into the lungs every 4 (four) hours as needed for wheezing or shortness of breath.   Yes Historical Provider, MD  ALPRAZolam (XANAX) 0.25 MG tablet Take 1 tablet (0.25 mg total) by mouth 3 (three) times daily as needed for anxiety. 10/18/15  Yes Loren Racer, MD  aspirin EC 81 MG tablet Take 81  mg by mouth daily.   Yes Historical Provider, MD  atorvastatin (LIPITOR) 40 MG tablet Take 40 mg by mouth daily.   Yes Historical Provider, MD  bisoprolol (ZEBETA) 5 MG tablet Take 0.5 tablets (2.5 mg total) by mouth at bedtime. 12/23/15  Yes Laurey Morale, MD  clopidogrel (PLAVIX) 75 MG tablet Take 75 mg by mouth daily.    Yes Historical Provider, MD  digoxin (LANOXIN) 0.125 MG tablet Take 1 tablet (0.125 mg total) by mouth daily. 12/15/15  Yes Brittainy Sherlynn Carbon, PA-C  furosemide (LASIX) 40 MG tablet Take 40 mg by mouth See admin instructions. Take 1 tablet (40 mg) by mouth daily, may also take an additional tablet as needed for fluid buildup   Yes Historical Provider, MD  hydrOXYzine (VISTARIL) 25 MG capsule Take 25 mg by mouth at bedtime as needed (sleep).   Yes Historical Provider, MD  losartan (COZAAR) 25 MG tablet Take 25 mg by mouth daily.   Yes Historical Provider, MD  nicotine (NICODERM CQ - DOSED IN MG/24 HOURS) 21 mg/24hr patch Place 21 mg onto the skin  daily.   Yes Historical Provider, MD  nitroGLYCERIN (NITROSTAT) 0.4 MG SL tablet Place 1 tablet (0.4 mg total) under the tongue every 5 (five) minutes as needed. For chest pain 03/29/15  Yes [name], MD  sacubitril-valsartan (ENTRESTO) 24-26 MG Take 1 tablet by mouth 2 (two) times daily. 01/13/16  Yes Laurey Morale, MD  spironolactone (ALDACTONE) 25 MG tablet Take 1 tablet (25 mg total) by mouth daily. 12/15/15  Yes Brittainy Sherlynn Carbon, PA-C    Family History Family History  Problem Relation Age of Onset  . Cancer Father   . Cancer Sister     twin sister and only one has cancer, unknown what kind  . Hypertension Brother   . Cancer      family hx  . Heart attack Neg Hx   . Stroke Neg Hx     Social History Social History  Substance Use Topics  . Smoking status: Former Smoker    Packs/day: 0.50    Years: 44.00    Types: Cigarettes    Quit date: 11/20/2015  . Smokeless tobacco: Never Used  . Alcohol use No      Comment: 08/29/2012 "6-8 beers once/wk". Has not had alcohol in the last 6-8 months     Allergies   Patient has no known allergies.   Review of Systems Review of Systems  Constitutional: Positive for fatigue. Negative for chills and fever.  HENT: Negative for ear pain and sore throat.   Eyes: Negative for pain and visual disturbance.  Respiratory: Positive for shortness of breath. Negative for cough.   Cardiovascular: Positive for chest pain and palpitations.  Gastrointestinal: Negative for abdominal pain and vomiting.  Genitourinary: Negative for dysuria and hematuria.  Musculoskeletal: Negative for arthralgias and back pain.  Skin: Negative for color change and rash.  Neurological: Positive for syncope and weakness. Negative for seizures.  All other systems reviewed and are negative.    Physical Exam Updated Vital Signs BP 115/76   Pulse 80   Temp 97.4 F (36.3 C) (Oral)   Resp (!) 21   Ht 5\' 11"  (1.803 m)   Wt 90.7 kg   SpO2 99%   BMI 27.89 kg/m   Physical Exam  Constitutional:  Appears chronically ill  HENT:  Head: Normocephalic.  Scattered abrasions over face  Eyes: Conjunctivae are normal.  Neck: Neck supple.  Cardiovascular: Tachycardia present.   Pulmonary/Chest: Effort normal and breath sounds normal. No respiratory distress. He has no wheezes. He has no rales.  AICD present in left upper chest  Abdominal: Soft. There is no tenderness.  Musculoskeletal: He exhibits no edema.  Neurological: He is alert.  Skin: Skin is warm and dry.  Psychiatric: He has a normal mood and affect.  Nursing note and vitals reviewed.    ED Treatments / Results  Labs (all labs ordered are listed, but only abnormal results are displayed) Labs Reviewed  DIGOXIN LEVEL - Abnormal; Notable for the following:       Result Value   Digoxin Level <0.2 (*)    All other components within normal limits  COMPREHENSIVE METABOLIC PANEL - Abnormal; Notable for the following:     Potassium 3.4 (*)    Glucose, Bld 144 (*)    All other components within normal limits  BRAIN NATRIURETIC PEPTIDE - Abnormal; Notable for the following:    B Natriuretic Peptide 595.7 (*)    All other components within normal limits  BLOOD GAS, ARTERIAL - Abnormal; Notable for  the following:    pH, Arterial 7.247 (*)    pCO2 arterial 50.5 (*)    pO2, Arterial 135 (*)    Acid-base deficit 4.9 (*)    All other components within normal limits  I-STAT TROPOININ, ED - Abnormal; Notable for the following:    Troponin i, poc 0.22 (*)    All other components within normal limits  MRSA PCR SCREENING  CBC  APTT  MAGNESIUM  RENAL FUNCTION PANEL  MAGNESIUM  CBC WITH DIFFERENTIAL/PLATELET  TROPONIN I  TROPONIN I  TROPONIN I  BASIC METABOLIC PANEL  LIDOCAINE LEVEL    EKG  EKG Interpretation  Date/Time:  Monday January 27 2016 14:06:53 EST Ventricular Rate:  97 PR Interval:    QRS Duration: 175 QT Interval:  464 QTC Calculation: 590 R Axis:   5 Text Interpretation:  ventricular-paced complexes Confirmed by KNOTT MD, DANIEL [phone]([id number]) on 01/27/2016 2:13:37 PM       Radiology Dg Chest Port 1 View  Result Date: 01/27/2016 CLINICAL DATA:  Ventricular flutter and fibrillation with multiple ICD shocks. Intubation. EXAM: PORTABLE CHEST 1 VIEW COMPARISON:  01/27/2016 and prior exams FINDINGS: Cardiomegaly, left-sided ICD, defibrillator pads overlying the chest again noted. Pulmonary vascular congestion is noted with possible mild interstitial edema. An endotracheal tube is noted with tip 4 cm above the carina and NG tube entering the stomach with tip off the field of view now noted. There is no evidence of pneumothorax or pleural effusion. IMPRESSION: Cardiomegaly with pulmonary vascular congestion and possible mild interstitial edema. Support apparatus as described. Electronically Signed   By: Harmon PierJeffrey  Hu M.D.   On: 01/27/2016 15:42   Dg Chest Port 1 View  Result Date: 01/27/2016 CLINICAL  DATA:  Evaluate pacemaker leads.ICD firing. EXAM: PORTABLE CHEST 1 VIEW COMPARISON:  12/16/2015 FINDINGS: Chronic cardiopericardial enlargement. Status post coronary stenting. Biventricular ICD/pacer from the left with leads in stable position. Mild basilar interstitial crowding. There is no edema, consolidation, effusion, or pneumothorax. IMPRESSION: Biventricular ICD/ pacer has a stable appearance since 12/16/2015. Electronically Signed   By: Marnee SpringJonathon  Watts M.D.   On: 01/27/2016 12:10    Procedures Procedures (including critical care time)  Medications Ordered in ED Medications  lidocaine (XYLOCAINE) 4 mg/mL bolus via infusion 100 mg (100 mg Intravenous Bolus from Bag 01/27/16 1346)    And  lidocaine (cardiac) IV  infusion 4 mg/mL (1 mg/min Intravenous New Bag/Given 01/27/16 1347)  amiodarone (NEXTERONE) 1.8 mg/mL load via infusion 150 mg (150 mg Intravenous Bolus from Bag 01/27/16 1249)    Followed by  amiodarone (NEXTERONE PREMIX) 360-4.14 MG/200ML-% (1.8 mg/mL) IV infusion (150 mg/hr Intravenous Bolus from Bag 01/27/16 1510)    Followed by  amiodarone (NEXTERONE PREMIX) 360-4.14 MG/200ML-% (1.8 mg/mL) IV infusion (not administered)  nitroGLYCERIN (NITROSTAT) SL tablet 0.4 mg (not administered)  acetaminophen (TYLENOL) tablet 650 mg (not administered)  ondansetron (ZOFRAN) injection 4 mg (not administered)  sacubitril-valsartan (ENTRESTO) 24-26 mg per tablet (not administered)  atorvastatin (LIPITOR) tablet 40 mg (not administered)  bisoprolol (ZEBETA) tablet 2.5 mg (not administered)  digoxin (LANOXIN) tablet 0.125 mg (not administered)  spironolactone (ALDACTONE) tablet 25 mg (not administered)  clopidogrel (PLAVIX) tablet 75 mg (not administered)  aspirin EC tablet 81 mg (not administered)  furosemide (LASIX) tablet 40 mg (not administered)  fluorescein ophthalmic strip 1 strip (not administered)  phenylephrine (NEO-SYNEPHRINE) 10 mg in dextrose 5 % 250 mL (0.04 mg/mL) infusion (0  mcg/min Intravenous Not Given 01/27/16 1535)  fentaNYL (SUBLIMAZE) 100 MCG/2ML injection (not  administered)  famotidine (PEPCID) IVPB 20 mg premix (not administered)  fentaNYL (SUBLIMAZE) injection 100 mcg (100 mcg Intravenous Given 01/27/16 1614)  fentaNYL (SUBLIMAZE) injection 100 mcg (not administered)  dexmedetomidine (PRECEDEX) 200 MCG/50ML (4 mcg/mL) infusion (0.6 mcg/kg/hr  90.7 kg Intravenous New Bag/Given 01/27/16 1827)  norepinephrine (LEVOPHED) 4 mg in dextrose 5 % 250 mL (0.016 mg/mL) infusion (10 mcg/min Intravenous Rate/Dose Change 01/27/16 1659)  potassium chloride 30 mEq in sodium chloride 0.9 % 265 mL (KCL MULTIRUN) IVPB (30 mEq Intravenous Given 01/27/16 1724)  midazolam (VERSED) 2 MG/2ML injection (not administered)  fentaNYL in NS (31mcg/ml) infusion-PREMIX (50 mcg/hr Intravenous New Bag/Given 01/27/16 1829)  midazolam (VERSED) injection 1-2 mg (2 mg Intravenous Given 01/27/16 1827)  albuterol (PROVENTIL) (2.5 MG/3ML) 0.083% nebulizer solution 2.5 mg (not administered)  LORazepam (ATIVAN) injection 0.5 mg (0.5 mg Intravenous Given 01/27/16 1248)  magnesium sulfate (IV Push/IM) injection (2 g Intravenous Given 01/27/16 1338)  propofol (DIPRIVAN) 10 mg/mL bolus/IV push (40 mg  Given 01/27/16 1358)  fentaNYL (SUBLIMAZE) injection (50 mcg Intravenous Given 01/27/16 1456)  etomidate (AMIDATE) injection (20 mg Intravenous Given 01/27/16 1458)  rocuronium (ZEMURON) injection (60 mg Intravenous Given 01/27/16 1459)  midazolam (VERSED) injection 2 mg (2 mg Intravenous Given 01/27/16 1638)     Initial Impression / Assessment and Plan / ED Course  I have reviewed the triage vital signs and the nursing notes.  Pertinent labs & imaging results that were available during my care of the patient were reviewed by me and considered in my medical decision making (see chart for details).  Clinical Course    Patient is a 75 male past medical history as above who presents after receiving  multiple shocks from his ICD. Tachycardic, normotensive without hypoxia on presentation. Cardiology consulted on patient's arrival. Device interrogation shows 41 total shocks. Labs w/ K 3.4, Mag 1.7, Dig <0.2, BUN/Cr near baseline at 14/1.19, elevated troponin and BNP. Hemoglobin and platelets stable. Patient was started on amiodarone load and drip per cardiology recommendations. Subsequently started on a lidocaine drip in addition. However, despite these measures patient had recurrent episodes of VT/VF and received additional ICD shocks 3. Patient was given 2 g of magnesium. Patient remained in VT/VF despite these measures. He received propofol with additional ICD shock. Amiodarone was re-bolused and patient returned to a paced rhythm. Admitted to cardiac ICU for further management.   Patient care supervised by Dr. Clydene Pugh, ED attending.  Final Clinical Impressions(s) / ED Diagnoses   Final diagnoses:  Encounter for intubation    New Prescriptions Current Discharge Medication List       Isa Rankin, MD 01/27/16 1901    Lyndal Pulley, MD 01/27/16 1945

## 2016-01-27 NOTE — Progress Notes (Signed)
   01/27/16 1335  Clinical Encounter Type  Visited With Family  Visit Type ED  Spiritual Encounters  Spiritual Needs Emotional  Stress Factors  Patient Stress Factors Not reviewed  Family Stress Factors Family relationships  Pt. Coded while family visiting. Family member panicked but doing better.

## 2016-01-27 NOTE — ED Triage Notes (Signed)
Pt to ER by [facility] due to AICD firing x10 this morning. Per EMS, 5 episodes of defibrillation witnessed by fire/EMS. Pt noted to be pale and diaphoretic. Has had multiple syncopal episodes. Pt is alert and oriented on arrival. VSS. Placed on zoll.

## 2016-01-27 NOTE — ED Provider Notes (Signed)
I saw and evaluated the patient, reviewed the resident's note and I agree with the findings and plan. Please see associated encounter note.   EKG Interpretation  Date/Time:  Monday January 27 2016 14:06:53 EST Ventricular Rate:  97 PR Interval:    QRS Duration: 175 QT Interval:  464 QTC Calculation: 590 R Axis:   5 Text Interpretation:  ventricular-paced complexes Confirmed by Dwyane Dupree MD, Ralph Benavidez [phone]([id number]) on 01/27/2016 2:13:37 PM      60 y.o. male presents with multiple ICD shocks during course of day. Arrived in paced rhythm and talking, had a period of decompensation and syncope where he was noted to have Vfib and was shocked multiple times by ICD in the ED. He recovered and again decompensated into unstable VT requiring multiple shocks but did not break until second amiodarone bolus of 150 mg with concurrent lidocaine drip. Cardiology at bedside working on pacemaker settings and following status. Critical care to admit.   CRITICAL CARE Performed by: Lyndal PulleyKnott, Albina Gosney Total critical care time: 30 minutes Critical care time was exclusive of separately billable procedures and treating other patients. Critical care was necessary to treat or prevent imminent or life-threatening deterioration. Critical care was time spent personally by me on the following activities: development of treatment plan with patient and/or surrogate as well as nursing, discussions with consultants, evaluation of patient's response to treatment, examination of patient, obtaining history from patient or surrogate, ordering and performing treatments and interventions, ordering and review of laboratory studies, ordering and review of radiographic studies, pulse oximetry and re-evaluation of patient's condition.  Procedural sedation Performed by: Lyndal PulleyKnott, Lakya Schrupp Consent: Emergent situation Time out: Immediately prior to procedure a "time out" was called to verify the correct patient, procedure, equipment, support staff and  site/side marked as required.  Sedation type: moderate (conscious) sedation NPO time confirmed and considedered  Sedatives: PROPOFOL  Physician Time at Bedside: 20 minutes  Vitals: Vital signs were monitored during sedation. Cardiac Monitor, pulse oximeter Patient tolerance: Patient tolerated the procedure well with no immediate complications. Comments: Pt with uneventful recovered. Returned to pre-procedural sedation baseline    .Cardioversion Date/Time: 01/27/2016 7:41 PM Performed by: Lyndal PulleyKNOTT, Aceyn Kathol Authorized by: Lyndal PulleyKNOTT, Cirilo Canner   Consent:    Consent obtained:  Emergent situation   Consent given by:  Patient Pre-procedure details:    Cardioversion basis:  Emergent   Rhythm:  Ventricular tachycardia   Electrode placement:  Anterior-posterior Attempt one:    Cardioversion mode:  Synchronous   Waveform:  Biphasic   Shock (joules) attempt one: 150 then 200.   Shock outcome:  No change in rhythm Post-procedure details:    Post-procedure mental status: sedated.   Patient tolerance of procedure:  Tolerated well, no immediate complications      Lyndal Pulleyaniel Itxel Wickard, MD 01/27/16 1945

## 2016-01-27 NOTE — Consult Note (Signed)
PULMONARY / CRITICAL CARE MEDICINE   Name: [name] MRN: [id number] DOB: 09/19/1955    ADMISSION DATE:  01/27/2016 CONSULTATION DATE:  12/4  REFERRING MD:  Graciela HusbandsKlein   CHIEF COMPLAINT: sustained VT  HISTORY OF PRESENT ILLNESS:    60 y.o. male with PMHx of CAD, ICM with intermittent CHB, RBBB, upgrade to CRT device in October 2017, chronic CHF, active smoker, HTN.  He reported that over the weekend he had a couple of episodes of dizziness, attributed tohis nicotine patch, better after taking it off.  He was at "the shop" getting ready to go into a shed when he started feeling weak and near syncopal, felt hiself get shocked, and sounds like was in-out feeling himself get shocked about 5 times he thinks, reportedly 5 witnessed by EMS as well. -Interrogation showed 41 total shocks reviewed with Dr. Graciela HusbandsKlein, appears V-Flutter that was shocked and degenerated into VF.  All are appropriate.  -PCCM was asked to see to sedate and ventilate to keep the pt comfortable as further cardiac evaluation takes place.   PAST MEDICAL HISTORY :  He  has a past medical history of Anxiety; Automatic implantable cardioverter-defibrillator in situ; CAD (coronary artery disease); Chronic systolic CHF (congestive heart failure), NYHA class 3 (HCC) - low output state (12/12/2014); Depression; Dyspnea; History of rheumatic fever (1962); HLD (hyperlipidemia) (mixed); HTN (hypertension); Ischemic cardiomyopathy; Myocardial infarction (1998; 2002; ~ 2010); Paroxysmal ventricular tachycardia (HCC); PVC's (premature ventricular contractions); RBBB; and TIA (transient ischemic attack).  PAST SURGICAL HISTORY: He  has a past surgical history that includes Cardiac defibrillator placement; Laparoscopic cholecystectomy; Hernia repair; percutaneous coronary stent intervention (pci-s) (N/A, 06/01/2011); left heart catheterization with coronary angiogram (N/A, 06/08/2011); percutaneous coronary stent intervention (pci-s) (N/A, 08/29/2012);  Cardiac catheterization (N/A, 10/29/2015); Cardiac catheterization (N/A, 12/11/2015); Cardiac catheterization (N/A, 12/11/2015); Cardiac catheterization (12/11/2015); and Cardiac catheterization (N/A, 12/11/2015).  No Known Allergies  No current facility-administered medications on file prior to encounter.    Current Outpatient Prescriptions on File Prior to Encounter  Medication Sig  . albuterol (PROAIR HFA) 108 (90 Base) MCG/ACT inhaler Inhale 2 puffs into the lungs every 4 (four) hours as needed for wheezing or shortness of breath.  . ALPRAZolam (XANAX) 0.25 MG tablet Take 1 tablet (0.25 mg total) by mouth 3 (three) times daily as needed for anxiety.  [name]. aspirin EC 81 MG tablet Take 81 mg by mouth daily.  [name]. atorvastatin (LIPITOR) 40 MG tablet Take 40 mg by mouth daily.  . bisoprolol (ZEBETA) 5 MG tablet Take 0.5 tablets (2.5 mg total) by mouth at bedtime.  . clopidogrel (PLAVIX) 75 MG tablet Take 75 mg by mouth daily.   . digoxin (LANOXIN) 0.125 MG tablet Take 1 tablet (0.125 mg total) by mouth daily.  . furosemide (LASIX) 40 MG tablet Take 40 mg by mouth See admin instructions. Take 1 tablet (40 mg) by mouth daily, may also take an additional tablet as needed for fluid buildup  . hydrOXYzine (VISTARIL) 25 MG capsule Take 25 mg by mouth at bedtime as needed (sleep).  . nitroGLYCERIN (NITROSTAT) 0.4 MG SL tablet Place 1 tablet (0.4 mg total) under the tongue every 5 (five) minutes as needed. For chest pain  . sacubitril-valsartan (ENTRESTO) 24-26 MG Take 1 tablet by mouth 2 (two) times daily.  [name]. spironolactone (ALDACTONE) 25 MG tablet Take 1 tablet (25 mg total) by mouth daily.    FAMILY HISTORY:  Not available   SOCIAL HISTORY: He  reports that he quit smoking about 2  months ago. His smoking use included Cigarettes. He has a 22.00 pack-year smoking history. He has never used smokeless tobacco. He reports that he does not drink alcohol or use drugs.  REVIEW OF SYSTEMS:   Not able    SUBJECTIVE:  Scared. In distress   VITAL SIGNS: BP 130/93   Pulse 117   Temp 97.4 F (36.3 C) (Oral)   Resp 25   Ht 5\' 11"  (1.803 m)   Wt 200 lb (90.7 kg)   SpO2 98%   BMI 27.89 kg/m   HEMODYNAMICS:    VENTILATOR SETTINGS: Vent Mode: PRVC FiO2 (%):  [100 %] 100 % Set Rate:  [14 bmp] 14 bmp Vt Set:  [600 mL] 600 mL PEEP:  [5 cmH20] 5 cmH20  INTAKE / OUTPUT: No intake/output data recorded.  PHYSICAL EXAMINATION: General:  60 year old male, awake, anxious, diaphoretic  Neuro:  Awake, oriented. No focal def  HEENT:  NCAT, no JVD. MMM Cardiovascular:  Tachy rrr.  Lungs:  Fine crackles in bases. Mild accessory use  Abdomen:  Not tender. No OM  Musculoskeletal:  Equal st and bulk  Skin:  Warm and dry   LABS:  BMET  Recent Labs Lab 01/27/16 1155  NA 140  K 3.4*  CL 107  CO2 22  BUN 14  CREATININE 1.19  GLUCOSE 144*    Electrolytes  Recent Labs Lab 01/27/16 1155  CALCIUM 9.4  MG 1.7    CBC  Recent Labs Lab 01/27/16 1155  WBC 6.9  HGB 13.6  HCT 40.6  PLT 219    Coag's  Recent Labs Lab 01/27/16 1155  APTT 31    Sepsis Markers No results for input(s): LATICACIDVEN, PROCALCITON, O2SATVEN in the last 168 hours.  ABG No results for input(s): PHART, PCO2ART, PO2ART in the last 168 hours.  Liver Enzymes  Recent Labs Lab 01/27/16 1155  AST 37  ALT 36  ALKPHOS 43  BILITOT 1.1  ALBUMIN 3.8    Cardiac Enzymes No results for input(s): TROPONINI, PROBNP in the last 168 hours.  Glucose No results for input(s): GLUCAP in the last 168 hours.  Imaging Dg Chest Port 1 View  Result Date: 01/27/2016 CLINICAL DATA:  Evaluate pacemaker leads.ICD firing. EXAM: PORTABLE CHEST 1 VIEW COMPARISON:  12/16/2015 FINDINGS: Chronic cardiopericardial enlargement. Status post coronary stenting. Biventricular ICD/pacer from the left with leads in stable position. Mild basilar interstitial crowding. There is no edema, consolidation, effusion, or  pneumothorax. IMPRESSION: Biventricular ICD/ pacer has a stable appearance since 12/16/2015. Electronically Signed   By: Marnee SpringJonathon  Watts M.D.   On: 01/27/2016 12:10     STUDIES:    CULTURES:   ANTIBIOTICS:   SIGNIFICANT EVENTS:   LINES/TUBES:   DISCUSSION: 60 year old w/ ICM comes in 12/4 w/ sustained VT (shocked by ICD > 40 times). PCCM asked to see for intubation and sedation so that further cardiac evaluation can be done and further discomfort from cardioversion avoided.   ASSESSMENT / PLAN:  PULMONARY A:  Pulmonary edema in setting of decompensated HF -->electively intubated for hemodynamic support  P:   Full vent support F/u abg F/u cxr   CARDIOVASCULAR A:  ICM w/ EF 15% Sustained VT P:  Full vent support Rhythm eval and rx per cards.   RENAL A:   Hypokalemia  Boarder line hypomagnesemia  P:   Replace K and Mg Strict I&O Avoid hypotension   GASTROINTESTINAL A:   No acute  P:   H2B NPO  HEMATOLOGIC  A:   No acute  P:  Trend cbc  Transfuse per protocol Place on [place] heparin   INFECTIOUS A:   No evidence of infection  P:   Trend fever and wbc curve   ENDOCRINE A:   Hyperglycemia  P:   ssi   NEUROLOGIC A:   Anxiety and pain  P:   RASS goal: -1 to -2 PAD protocol; will use precedex and PRN fent    FAMILY  - Updates:   - Inter-disciplinary family meet or Palliative Care meeting due by: 12/11  My critical care time 30 minutes   Simonne Martinet ACNP-BC [facility] Pulmonary/Critical Care Pager # [phone] OR # [phone] if no answer   01/27/2016, 3:24 PM

## 2016-01-27 NOTE — Progress Notes (Signed)
eLink Physician-Brief Progress Note Patient Name: [name] DOB: 03/27/1955 MRN: [id number]   Date of Service  01/27/2016  HPI/Events of Note  Agitation on precedex and PRN fentanyl  eICU Interventions  Fentanyl drip and PRN versed ordered.     Intervention Category Major Interventions: Other:  YACOUB,WESAM 01/27/2016, 4:55 PM

## 2016-01-28 DIAGNOSIS — I5023 Acute on chronic systolic (congestive) heart failure: Secondary | ICD-10-CM

## 2016-01-28 LAB — CBC WITH DIFFERENTIAL/PLATELET
BASOS PCT: 0 %
Basophils Absolute: 0 10*3/uL (ref 0.0–0.1)
EOS ABS: 0.2 10*3/uL (ref 0.0–0.7)
Eosinophils Relative: 1 %
HCT: 40.7 % (ref 39.0–52.0)
HEMOGLOBIN: 13.4 g/dL (ref 13.0–17.0)
LYMPHS ABS: 2.2 10*3/uL (ref 0.7–4.0)
Lymphocytes Relative: 19 %
MCH: 29.5 pg (ref 26.0–34.0)
MCHC: 32.9 g/dL (ref 30.0–36.0)
MCV: 89.6 fL (ref 78.0–100.0)
Monocytes Absolute: 1.1 10*3/uL — ABNORMAL HIGH (ref 0.1–1.0)
Monocytes Relative: 9 %
NEUTROS PCT: 71 %
Neutro Abs: 8.2 10*3/uL — ABNORMAL HIGH (ref 1.7–7.7)
Platelets: 251 10*3/uL (ref 150–400)
RBC: 4.54 MIL/uL (ref 4.22–5.81)
RDW: 15.2 % (ref 11.5–15.5)
WBC: 11.6 10*3/uL — AB (ref 4.0–10.5)

## 2016-01-28 LAB — RENAL FUNCTION PANEL
ALBUMIN: 3.2 g/dL — AB (ref 3.5–5.0)
Anion gap: 4 — ABNORMAL LOW (ref 5–15)
BUN: 13 mg/dL (ref 6–20)
CALCIUM: 8.7 mg/dL — AB (ref 8.9–10.3)
CO2: 27 mmol/L (ref 22–32)
Chloride: 107 mmol/L (ref 101–111)
Creatinine, Ser: 1.23 mg/dL (ref 0.61–1.24)
GFR calc Af Amer: >60 mL/min (ref >60)
GFR calc non Af Amer: >60 mL/min (ref >60)
GLUCOSE: 125 mg/dL — AB (ref 65–99)
PHOSPHORUS: 3.3 mg/dL (ref 2.5–4.6)
Potassium: 4.4 mmol/L (ref 3.5–5.1)
SODIUM: 138 mmol/L (ref 135–145)

## 2016-01-28 LAB — BLOOD GAS, ARTERIAL
Acid-base deficit: 4.9 mmol/L — ABNORMAL HIGH (ref 0.0–2.0)
Bicarbonate: 21.2 mmol/L (ref 20.0–28.0)
DRAWN BY: 275531
FIO2: 100
LHR: 14 {breaths}/min
O2 Saturation: 97.7 %
PCO2 ART: 50.5 mmHg — AB (ref 32.0–48.0)
PEEP: 5 cmH2O
Patient temperature: 98.6
VT: 600 mL
pH, Arterial: 7.247 — ABNORMAL LOW (ref 7.350–7.450)
pO2, Arterial: 135 mmHg — ABNORMAL HIGH (ref 83.0–108.0)

## 2016-01-28 LAB — LIDOCAINE LEVEL: LIDOCAINE LVL: 1.5 ug/mL (ref 1.5–5.0)

## 2016-01-28 LAB — MAGNESIUM: MAGNESIUM: 1.9 mg/dL (ref 1.7–2.4)

## 2016-01-28 LAB — TROPONIN I
TROPONIN I: 2.5 ng/mL — AB (ref <0.03)
Troponin I: 1.57 ng/mL (ref <0.03)

## 2016-01-28 LAB — GLUCOSE, CAPILLARY: GLUCOSE-CAPILLARY: 97 mg/dL (ref 65–99)

## 2016-01-28 MED ORDER — CHLORHEXIDINE GLUCONATE 0.12% ORAL RINSE (MEDLINE KIT)
15.0000 mL | Freq: Two times a day (BID) | OROMUCOSAL | Status: DC
  Administered 2016-01-28 – 2016-01-29 (×3): 15 mL via OROMUCOSAL

## 2016-01-28 MED ORDER — FUROSEMIDE 10 MG/ML IJ SOLN
40.0000 mg | Freq: Once | INTRAMUSCULAR | Status: AC
  Administered 2016-01-28: 40 mg via INTRAVENOUS
  Filled 2016-01-28: qty 4

## 2016-01-28 MED ORDER — ENOXAPARIN SODIUM 40 MG/0.4ML ~~LOC~~ SOLN
40.0000 mg | SUBCUTANEOUS | Status: DC
  Administered 2016-01-28 – 2016-01-29 (×2): 40 mg via SUBCUTANEOUS
  Filled 2016-01-28 (×3): qty 0.4

## 2016-01-28 MED ORDER — ORAL CARE MOUTH RINSE
15.0000 mL | Freq: Four times a day (QID) | OROMUCOSAL | Status: DC
  Administered 2016-01-28 – 2016-01-29 (×6): 15 mL via OROMUCOSAL

## 2016-01-28 MED ORDER — MAGNESIUM SULFATE IN D5W 1-5 GM/100ML-% IV SOLN
1.0000 g | Freq: Once | INTRAVENOUS | Status: AC
  Administered 2016-01-28: 1 g via INTRAVENOUS
  Filled 2016-01-28: qty 100

## 2016-01-28 MED ORDER — DIGOXIN 0.25 MG/ML IJ SOLN: 0.2500 mg | Freq: Three times a day (TID) | INTRAMUSCULAR | Status: DC

## 2016-01-28 MED ORDER — SODIUM CHLORIDE 0.9 % IV BOLUS (SEPSIS)
500.0000 mL | Freq: Once | INTRAVENOUS | Status: AC
  Administered 2016-01-28: 500 mL via INTRAVENOUS

## 2016-01-28 MED ORDER — DIGOXIN 125 MCG PO TABS
0.1250 mg | ORAL_TABLET | Freq: Every day | ORAL | Status: DC
  Administered 2016-01-29 – 2016-02-02 (×5): 0.125 mg via ORAL
  Filled 2016-01-28 (×5): qty 1

## 2016-01-28 MED ORDER — DIGOXIN 0.25 MG/ML IJ SOLN
0.2500 mg | Freq: Once | INTRAMUSCULAR | Status: AC
  Administered 2016-01-28: 0.25 mg via INTRAVENOUS
  Filled 2016-01-28: qty 2

## 2016-01-28 NOTE — Progress Notes (Signed)
eLink Physician-Brief Progress Note Patient Name: [name] LuriaLewis R Golla DOB: 10/06/1955 MRN: [id number]   Date of Service  01/28/2016  HPI/Events of Note  Oliguria CHF No fluids given? No swan  eICU Interventions  500cc saline bolus     Intervention Category Intermediate Interventions: Oliguria - evaluation and management  Max FickleDouglas McQuaid 01/28/2016, 12:03 AM

## 2016-01-28 NOTE — Consult Note (Addendum)
Advanced Heart Failure Team Consult Note  Admit date: 01/27/2016 Date of Consult: 01/27/2016   Primary Physician: Andreas Blower, MD Primary Cardiologist: Dr. Aundra Dubin Electrophysiologist: Dr. Caryl Comes  Reason for Consultation: multiple ICD shocks  HPI:     [name] is a 60 y.o. male with PMHx of CAD, ICM with intermittent CHB, RBBB, upgrade to CRT device in October, chronic CHF, active smoker, HTN.  He was doing well when last seen in CHF clinic.  He reports that over the weekend he had a couple of episodes of dizziness, attributed tohis nicotine patch, better after taking it off.  He was at "the shop" getting ready to go into a shed when he started feeling weak and near syncopal, felt hiself get shocked, and sounds like was in-out feeling himself get shocked about 5 times he thinks, reportedly 5 witnessed by EMS as well.  ICD interrogation in ED showed 41 total shocks. Rhythms included VFlutter that was shocked and degenerated into VF per EP.   With repeated shocks, was intubated and sedated. Placed on amiodarone and lidocaine gtts.   Remains intubated and with some sedation on board, though wakes to stimulus and responds to questioning.  History obtained from previous notes. He is now on norepinephrine 8.   Had only 1 short run NSVT overnight.    Shakes head "NO" to any pain or difficulty breathing on vent.   LV lead turned off, ?pro-arrhythmia.  Now a-paced, v-sensed (set DDD).   Review of Systems: [y] = yes, [ ]  = no   General: Weight gain [ ] ; Weight loss [ ] ; Anorexia [ ] ; Fatigue [ ] ; Fever [ ] ; Chills [ ] ; Weakness [y]  Cardiac: Chest pain/pressure [ ] ; Resting SOB [ ] ; Exertional SOB [ ] ; Orthopnea [ ] ; Pedal Edema [ ] ; Palpitations [ ] ; Syncope [ ] ; Presyncope [y]; Paroxysmal nocturnal dyspnea[ ]   Pulmonary: Cough [ ] ; Wheezing[ ] ; Hemoptysis[ ] ; Sputum [ ] ; Snoring [ ]   GI: Vomiting[ ] ; Dysphagia[ ] ; Melena[ ] ; Hematochezia [ ] ; Heartburn[ ] ; Abdominal pain [ ] ;  Constipation [ ] ; Diarrhea [ ] ; BRBPR [ ]   GU: Hematuria[ ] ; Dysuria [ ] ; Nocturia[ ]   Vascular: Pain in legs with walking [ ] ; Pain in feet with lying flat [ ] ; Non-healing sores [ ] ; Stroke [ ] ; TIA [ ] ; Slurred speech [ ] ;  Neuro: Headaches[ ] ; Vertigo[ ] ; Seizures[ ] ; Paresthesias[ ] ;Blurred vision [ ] ; Diplopia [ ] ; Vision changes [ ]   Ortho/Skin: Arthritis [ ] ; Joint pain [ ] ; Muscle pain [ ] ; Joint swelling [ ] ; Back Pain [ ] ; Rash [ ]   Psych: Depression[ ] ; Anxiety[ ]   Heme: Bleeding problems [ ] ; Clotting disorders [ ] ; Anemia [ ]   Endocrine: Diabetes [ ] ; Thyroid dysfunction[ ]   Home Medications Prior to Admission medications   Medication Sig Start Date End Date Taking? Authorizing Provider  albuterol (PROAIR HFA) 108 (90 Base) MCG/ACT inhaler Inhale 2 puffs into the lungs every 4 (four) hours as needed for wheezing or shortness of breath.   Yes Historical Provider, MD  ALPRAZolam (XANAX) 0.25 MG tablet Take 1 tablet (0.25 mg total) by mouth 3 (three) times daily as needed for anxiety. 10/18/15  Yes Julianne Rice, MD  aspirin EC 81 MG tablet Take 81 mg by mouth daily.   Yes Historical Provider, MD  atorvastatin (LIPITOR) 40 MG tablet Take 40 mg by mouth daily.   Yes Historical Provider, MD  bisoprolol (ZEBETA) 5 MG tablet Take 0.5  tablets (2.5 mg total) by mouth at bedtime. 12/23/15  Yes Larey Dresser, MD  clopidogrel (PLAVIX) 75 MG tablet Take 75 mg by mouth daily.    Yes Historical Provider, MD  digoxin (LANOXIN) 0.125 MG tablet Take 1 tablet (0.125 mg total) by mouth daily. 12/15/15  Yes Brittainy Erie Noe, PA-C  furosemide (LASIX) 40 MG tablet Take 40 mg by mouth See admin instructions. Take 1 tablet (40 mg) by mouth daily, may also take an additional tablet as needed for fluid buildup   Yes Historical Provider, MD  hydrOXYzine (VISTARIL) 25 MG capsule Take 25 mg by mouth at bedtime as needed (sleep).   Yes Historical Provider, MD  losartan (COZAAR) 25 MG tablet Take 25 mg by  mouth daily.   Yes Historical Provider, MD  nicotine (NICODERM CQ - DOSED IN MG/24 HOURS) 21 mg/24hr patch Place 21 mg onto the skin daily.   Yes Historical Provider, MD  nitroGLYCERIN (NITROSTAT) 0.4 MG SL tablet Place 1 tablet (0.4 mg total) under the tongue every 5 (five) minutes as needed. For chest pain 03/29/15  Yes Lelon Perla, MD  sacubitril-valsartan (ENTRESTO) 24-26 MG Take 1 tablet by mouth 2 (two) times daily. 01/13/16  Yes Larey Dresser, MD  spironolactone (ALDACTONE) 25 MG tablet Take 1 tablet (25 mg total) by mouth daily. 12/15/15  Yes Brittainy Erie Noe, PA-C    Past Medical History: Past Medical History:  Diagnosis Date  . Anxiety   . Automatic implantable cardioverter-defibrillator in situ    a. Medtronic ICD.  [name] CAD (coronary artery disease)    a.  Severe LAD stenosis 2/2 acute thrombus - BMS 2009;  b. 06/01/11 Cath - LAD 20 isr, 57m(3.0x16 Veri-flex BMS), OM1 100p, EF 20-25%;  c. 07/2012 Abnl Cardiolite;  d. 08/2012 Cath/PCI: LM nl, LAD patent stents, D2 80-90 (jailed), LCX 90 p/m (4.0x24 Promus Premier DES), OM1 100, OM2 80-90p (3.0x20 Promus Premier DES), RCA patent mid stent, 40d, EF 25%.  e. 10/29/15 LHC stable disease with patent stents  . Chronic systolic CHF (congestive heart failure), NYHA class 3 (HCC) - low output state 12/12/2014  . Depression   . Dyspnea   . History of rheumatic fever 1962  . HLD (hyperlipidemia) mixed  . HTN (hypertension)   . Ischemic cardiomyopathy    a.  EF 30-35% 2010;  b.  EF 20-25% by LV gram 08/2012. c. EF 15% by echo 11/2014.  .Marland KitchenMyocardial infarction 1998; 2002; ~ 2010  . Paroxysmal ventricular tachycardia (HCC)    a. VFlutter  CL 210 msec  Rx shock 04/2011  . PVC's (premature ventricular contractions)   . RBBB   . TIA (transient ischemic attack)    a. Dx 11/2014, continued on ASA/Plavix.    Past Surgical History: Past Surgical History:  Procedure Laterality Date  . CARDIAC CATHETERIZATION N/A 10/29/2015   Procedure:  Right/Left Heart Cath and Coronary Angiography;  Surgeon: Peter M JMartinique MD;  [facility];  Service: Cardiovascular;  Laterality: N/A;  . CARDIAC DEFIBRILLATOR PLACEMENT     MDT single chamber primary prevention ICD implanted by Dr KCaryl Comesas part of MASTER study  . EP IMPLANTABLE DEVICE N/A 12/11/2015   Procedure: BiV Upgrade;  Surgeon: SDeboraha Sprang MD;  [facility];  Service: Cardiovascular;  Laterality: N/A;  . EP IMPLANTABLE DEVICE N/A 12/11/2015   Procedure: Pocket Revision/Relocation;  Surgeon: SDeboraha Sprang MD;  [facility];  Service: Cardiovascular;  Laterality: N/A;  . EP IMPLANTABLE DEVICE N/A 12/11/2015   Procedure: Lead Revision/Repair;  Surgeon: Deboraha Sprang, MD;  Location: [place] CV LAB;  Service: Cardiovascular;  Laterality: N/A;  . HERNIA REPAIR     "w/gallbladder OR" (08/29/2012)  . LAPAROSCOPIC CHOLECYSTECTOMY    . LEFT HEART CATHETERIZATION WITH CORONARY ANGIOGRAM N/A 06/08/2011   Procedure: LEFT HEART CATHETERIZATION WITH CORONARY ANGIOGRAM;  Surgeon: Jolaine Artist, MD;  Location: [facility] CATH LAB;  Service: Cardiovascular;  Laterality: N/A;  . PERCUTANEOUS CORONARY STENT INTERVENTION (PCI-S) N/A 06/01/2011   Procedure: PERCUTANEOUS CORONARY STENT INTERVENTION (PCI-S);  Surgeon: Peter M Martinique, MD;  Location: [facility] CATH LAB;  Service: Cardiovascular;  Laterality: N/A;  . PERCUTANEOUS CORONARY STENT INTERVENTION (PCI-S) N/A 08/29/2012   Procedure: PERCUTANEOUS CORONARY STENT INTERVENTION (PCI-S);  Surgeon: Peter M Martinique, MD;  [facility];  Service: Cardiovascular;  Laterality: N/A;  . PERIPHERAL VASCULAR CATHETERIZATION  12/11/2015   Procedure: Upper Extremity Venography;  Surgeon: Deboraha Sprang, MD;  Location: [place] CV LAB;  Service: Cardiovascular;;    Family History: Family History  Problem Relation Age of Onset  . Cancer Father   . Cancer Sister     twin sister and only one has cancer, unknown what  kind  . Hypertension Brother   . Cancer      family hx  . Heart attack Neg Hx   . Stroke Neg Hx     Social History: Social History   Social History  . Marital status: Legally Separated    Spouse name: N/A  . Number of children: N/A  . Years of education: N/A   Social History Main Topics  . Smoking status: Former Smoker    Packs/day: 0.50    Years: 44.00    Types: Cigarettes    Quit date: 11/20/2015  . Smokeless tobacco: Never Used  . Alcohol use No     Comment: 08/29/2012 "6-8 beers once/wk". Has not had alcohol in the last 6-8 months  . Drug use: No  . Sexual activity: Not Currently   Other Topics Concern  . None   Social History Narrative   Married; full time.     Allergies:  No Known Allergies  Objective:    Vital Signs:   Temp:  [97 F (36.1 C)-98.6 F (37 C)] 98 F (36.7 C) (12/05 0400) Pulse Rate:  [55-117] 80 (12/05 0700) Resp:  [12-31] 15 (12/05 0700) BP: (88-141)/(51-98) 105/56 (12/05 0700) SpO2:  [93 %-100 %] 99 % (12/05 0700) FiO2 (%):  [40 %-100 %] 40 % (12/05 0400) Weight:  [200 lb (90.7 kg)-200 lb 2.8 oz (90.8 kg)] 200 lb 2.8 oz (90.8 kg) (12/05 0530)    Weight change: Filed Weights   01/27/16 1431 01/28/16 0530  Weight: 200 lb (90.7 kg) 200 lb 2.8 oz (90.8 kg)    Intake/Output:   Intake/Output Summary (Last 24 hours) at 01/28/16 0800 Last data filed at 01/28/16 0700  Gross per 24 hour  Intake           1776.3 ml  Output              688 ml  Net           1088.3 ml     Physical Exam: General:  Chronically ill appearing. Intubated. Sedated, but responds to questioning.  HEENT: ETT in place.  Neck: supple. JVP does not appear elevated. Carotids 2+ bilat; no bruits. No thyromegaly or nodule noted.  Cor: PMI nondisplaced.  RRR. No M/G/R noted.  Lungs: Mechanical breathing sounds. Mostly clear. Abdomen: Soft, NT, ND, no HSM. No bruits or masses. +BS  Extremities: No cyanosis, clubbing, rash. No peripheral edema. Neuro: Alert &  orientedx3, cranial nerves grossly intact. moves all 4 extremities w/o difficulty. Affect pleasant  Telemetry: Reviewed.  Quiescent overnight, short NSVT.  V-pacing.  Labs: Basic Metabolic Panel:  Recent Labs Lab 01/27/16 1155  NA 140  K 3.4*  CL 107  CO2 22  GLUCOSE 144*  BUN 14  CREATININE 1.19  CALCIUM 9.4  MG 1.7    Liver Function Tests:  Recent Labs Lab 01/27/16 1155  AST 37  ALT 36  ALKPHOS 43  BILITOT 1.1  PROT 7.3  ALBUMIN 3.8   No results for input(s): LIPASE, AMYLASE in the last 168 hours. No results for input(s): AMMONIA in the last 168 hours.  CBC:  Recent Labs Lab 01/27/16 1155  WBC 6.9  HGB 13.6  HCT 40.6  MCV 88.6  PLT 219    Cardiac Enzymes:  Recent Labs Lab 01/27/16 1803 01/27/16 2338  TROPONINI 1.89* 2.50*    BNP: BNP (last 3 results)  Recent Labs  12/06/15 1537 01/13/16 1008 01/27/16 1155  BNP 1,701.1* 256.0* 595.7*    ProBNP (last 3 results) No results for input(s): PROBNP in the last 8760 hours.   CBG: No results for input(s): GLUCAP in the last 168 hours.  Coagulation Studies: No results for input(s): LABPROT, INR in the last 72 hours.  Imaging: Dg Chest Port 1 View  Result Date: 01/27/2016 CLINICAL DATA:  Ventricular flutter and fibrillation with multiple ICD shocks. Intubation. EXAM: PORTABLE CHEST 1 VIEW COMPARISON:  01/27/2016 and prior exams FINDINGS: Cardiomegaly, left-sided ICD, defibrillator pads overlying the chest again noted. Pulmonary vascular congestion is noted with possible mild interstitial edema. An endotracheal tube is noted with tip 4 cm above the carina and NG tube entering the stomach with tip off the field of view now noted. There is no evidence of pneumothorax or pleural effusion. IMPRESSION: Cardiomegaly with pulmonary vascular congestion and possible mild interstitial edema. Support apparatus as described. Electronically Signed   By: Margarette Canada M.D.   On: 01/27/2016 15:42   Dg Chest Port  1 View  Result Date: 01/27/2016 CLINICAL DATA:  Evaluate pacemaker leads.ICD firing. EXAM: PORTABLE CHEST 1 VIEW COMPARISON:  12/16/2015 FINDINGS: Chronic cardiopericardial enlargement. Status post coronary stenting. Biventricular ICD/pacer from the left with leads in stable position. Mild basilar interstitial crowding. There is no edema, consolidation, effusion, or pneumothorax. IMPRESSION: Biventricular ICD/ pacer has a stable appearance since 12/16/2015. Electronically Signed   By: Monte Fantasia M.D.   On: 01/27/2016 12:10      Medications:     Current Medications: . aspirin EC  81 mg Oral Daily  . atorvastatin  40 mg Oral Daily  . chlorhexidine gluconate (MEDLINE KIT)  15 mL Mouth Rinse BID  . clopidogrel  75 mg Oral Daily  . digoxin  0.125 mg Oral Daily  . famotidine (PEPCID) IV  20 mg Intravenous Q12H  . fluorescein  1 strip Both Eyes Once  . furosemide  40 mg Oral Daily  . mouth rinse  15 mL Mouth Rinse QID     Infusions: . amiodarone 30 mg/hr (01/28/16 0751)  . dexmedetomidine 0.9 mcg/kg/hr (01/28/16 0753)  . fentaNYL infusion INTRAVENOUS 50 mcg/hr (01/28/16 0753)  . lidocaine 1 mg/min (01/27/16 1347)  . norepinephrine (LEVOPHED) Adult infusion 8 mcg/min (01/28/16 0659)  . phenylephrine (NEO-SYNEPHRINE) Adult infusion  Assessment/Plan   1. VT/VF storm - Currently intubated with sedation - Remains on amio drip and lidocaine drip per EP - EP to give IV digoxin today.  - Hold anti-hypertensives for now with pressor support.  - Wean vent as tolerated.  Decreasing sedation this am.  2. Chronic systolic HF - Volume status looks stable.  - OK to continue po lasix for now, especially with multiple drips. Monitor fluid status closely - Hold Entresto, bisoprolol, and Arlyce Harman for now with pressor support - Add back HF meds as tolerated. 3. CAD - Denies chest pain. - Recent LHC in 9/17 with stable coronary disease.  Though with new VT/VF, may need to look for  ischemic changes. - Continue ASA 81, Plavix, and Statin 4. Ascending aortic aneurysm:  - 4.1 cm in 8/17. Needs repeat CT in 8/18.  We will follow closely with EP.   Length of Stay: 1  Shirley Friar, [place]  01/28/2016, 8:00 AM  Advanced Heart Failure Team Pager [phone] (M-F; 7a - 4p)  Please contact [facility] for night-coverage after hours (4p -7a ) and weekends on [email or web address]  Patient seen with PA, agree with the above note.  Admitted with VT storm.  No clear trigger.  Elevated troponin but mild and likely due to multiple shocks.  Prior to VT, he had not noted chest pain or increased dyspnea.  Overall, had been doing well.  1. VT storm: Quiet now on amiodarone and lidocaine gtts.  ?Trigger => K was mildly low but should not have caused.  ?Triggered by BiV pacing.  ?Ischemia.   - Continue amiodarone/lidocaine per EP.  - LV lead off.  Has underlying wide RBBB.  In DDD mode, currently pacing atrium.  - Will likely do coronary angiography this admission.   - Await labs this am for K/Mg.   2. Acute on chronic systolic CHF: Ischemic cardiomyopathy.  Echo 10/17 with EF 15%.  Had admission in 10/17 with low output noted in setting of bradycardia/high grade heart block.  He had been much improved with pacing, was NYHA class II-III at last office appt.  Currently with pulmonary edema on CXR, suspect stunning with worsening of cardiac function post-multiple shocks.  He is currently on norepinephrine (titrating off).  - Continue digoxin.  - Wean norepinephrine as tolerated. If he cannot wean off this quickly, will need central access.  - Will give Lasix 40 mg IV x 1 now, may need more IV Lasix, will follow response.  3. CAD: No chest pain prior to event, stable cath in 9/17.  However, given VT storm would consider eventual re-look at coronaries.  Mild troponin elevation likely due to multiple shocks.  Continue ASA, statin, Plavix.  4. Rhythm: As above, history of high grade heart block.   LV lead turned off, DDD mode currently.  5. Pulmonary: Sedated yesterday given multiple shocks and therefore intubated for full support.  Wean vent per CCM.   35 minutes critical care time.   Loralie Champagne 01/28/2016 9:33 AM

## 2016-01-28 NOTE — Progress Notes (Signed)
Dr. McQuaid notified of low UOP, orders received 

## 2016-01-28 NOTE — Progress Notes (Signed)
SUBJECTIVE: The patient is intubated, sedated, but responds to verbal, opens eyes and follows direction.  [name] aspirin EC  81 mg Oral Daily  . atorvastatin  40 mg Oral Daily  . chlorhexidine gluconate (MEDLINE KIT)  15 mL Mouth Rinse BID  . clopidogrel  75 mg Oral Daily  . digoxin  0.25 mg Intravenous Q8H  . famotidine (PEPCID) IV  20 mg Intravenous Q12H  . fluorescein  1 strip Both Eyes Once  . furosemide  40 mg Oral Daily  . mouth rinse  15 mL Mouth Rinse QID   . amiodarone 30 mg/hr (01/28/16 0800)  . dexmedetomidine 0.9 mcg/kg/hr (01/28/16 0753)  . fentaNYL infusion INTRAVENOUS 50 mcg/hr (01/28/16 0753)  . lidocaine 1 mg/min (01/28/16 0800)  . norepinephrine (LEVOPHED) Adult infusion 8 mcg/min (01/28/16 0800)  . phenylephrine (NEO-SYNEPHRINE) Adult infusion      OBJECTIVE: Physical Exam: Vitals:   01/28/16 0630 01/28/16 0645 01/28/16 0700 01/28/16 0800  BP: (!) 101/56 (!) 103/55 (!) 105/56 114/62  Pulse: 80 80 80 80  Resp: 14 14 15 14   Temp:      TempSrc:      SpO2: 99% 99% 99% 99%  Weight:      Height:        Intake/Output Summary (Last 24 hours) at 01/28/16 6389 Last data filed at 01/28/16 0800  Gross per 24 hour  Intake          1858.87 ml  Output              688 ml  Net          1170.87 ml    Telemetry is reviewed with dr. Caryl Comes, SR, very brief NSVT only  GEN- The patient intubated, sedated, responds to verbal.   Head- normocephalic, slight abrasion left cheek Eyes-  Sclera clear, conjunctiva pink Ears- hearing intact Oropharynx- ETT Neck- supple, no JVP Lungs- diminished at the bases, intubated Heart- Regular rate and rhythm, no significant murmurs, no rubs or gallops GI- soft, NT, ND Extremities- no clubbing, cyanosis, or edema Skin- no rash or lesion Psych- euthymic mood, full affect Neuro- unable to examine  LABS: Basic Metabolic Panel:  Recent Labs  01/27/16 1155  NA 140  K 3.4*  CL 107  CO2 22  GLUCOSE 144*  BUN 14  CREATININE  1.19  CALCIUM 9.4  MG 1.7   Liver Function Tests:  Recent Labs  01/27/16 1155  AST 37  ALT 36  ALKPHOS 43  BILITOT 1.1  PROT 7.3  ALBUMIN 3.8   CBC:  Recent Labs  01/27/16 1155  WBC 6.9  HGB 13.6  HCT 40.6  MCV 88.6  PLT 219   Cardiac Enzymes:  Recent Labs  01/27/16 1803 01/27/16 2338  TROPONINI 1.89* 2.50*    RADIOLOGY: Dg Chest Port 1 View Result Date: 01/27/2016 CLINICAL DATA:  Ventricular flutter and fibrillation with multiple ICD shocks. Intubation. EXAM: PORTABLE CHEST 1 VIEW COMPARISON:  01/27/2016 and prior exams FINDINGS: Cardiomegaly, left-sided ICD, defibrillator pads overlying the chest again noted. Pulmonary vascular congestion is noted with possible mild interstitial edema. An endotracheal tube is noted with tip 4 cm above the carina and NG tube entering the stomach with tip off the field of view now noted. There is no evidence of pneumothorax or pleural effusion. IMPRESSION: Cardiomegaly with pulmonary vascular congestion and possible mild interstitial edema. Support apparatus as described. Electronically Signed   By: Margarette Canada M.D.   On: 01/27/2016 15:42  ASSESSMENT AND PLAN:   1. Syncope/VT/VF storm    >40 shocks    Intubated and sedated for patient comfort/safety    Only a brief NSVT overnight    Continue lidocaine and amio gtts today, no change today    IV dig today    Labs are pending  2. No prior complaints of CP     Low suspicion of coronary event     Trop 2.50, felt 2/2 multiple shocks, VF/VT     Not ACS  3. ICM    CHF team on board this AM  4. ICD site     Will need to address once clinically able     Fluctuation, discoloration  5. R eye pain, E noted some dirt/debris, flushed out in ED  Tommye Standard, PA-C 01/28/2016 8:08 AM

## 2016-01-28 NOTE — Progress Notes (Signed)
PULMONARY / CRITICAL CARE MEDICINE   Name: [name] MRN: [id number] DOB: 04/16/1955    ADMISSION DATE:  01/27/2016 CONSULTATION DATE:  12/4  REFERRING MD:  Graciela HusbandsKlein   CHIEF COMPLAINT: sustained VT  Brief:    60 y.o. male with PMHx of CAD, ICM with intermittent CHB, RBBB, upgrade to CRT device in October 2017, chronic CHF, active smoker, HTN.  He reported that over the weekend he had a couple of episodes of dizziness, attributed to his nicotine patch, better after taking it off.  He was at "the shop" getting ready to go into a shed when he started feeling weak and near syncopal, felt hiself get shocked, and sounds like was in-out feeling himself get shocked about 5 times he thinks, reportedly 5 witnessed by EMS as well. -Interrogation showed 41 total shocks reviewed with Dr. Graciela HusbandsKlein, appears V-Flutter that was shocked and degenerated into VF.  All are appropriate.  -PCCM was asked to see to sedate and ventilate to keep the pt comfortable as further cardiac evaluation takes place.   PAST MEDICAL HISTORY :  He  has a past medical history of Anxiety; Automatic implantable cardioverter-defibrillator in situ; CAD (coronary artery disease); Chronic systolic CHF (congestive heart failure), NYHA class 3 (HCC) - low output state (12/12/2014); Depression; Dyspnea; History of rheumatic fever (1962); HLD (hyperlipidemia) (mixed); HTN (hypertension); Ischemic cardiomyopathy; Myocardial infarction (1998; 2002; ~ 2010); Paroxysmal ventricular tachycardia (HCC); PVC's (premature ventricular contractions); RBBB; and TIA (transient ischemic attack).  PAST SURGICAL HISTORY: He  has a past surgical history that includes Cardiac defibrillator placement; Laparoscopic cholecystectomy; Hernia repair; percutaneous coronary stent intervention (pci-s) (N/A, 06/01/2011); left heart catheterization with coronary angiogram (N/A, 06/08/2011); percutaneous coronary stent intervention (pci-s) (N/A, 08/29/2012); Cardiac catheterization  (N/A, 10/29/2015); Cardiac catheterization (N/A, 12/11/2015); Cardiac catheterization (N/A, 12/11/2015); Cardiac catheterization (12/11/2015); and Cardiac catheterization (N/A, 12/11/2015).  No Known Allergies  No current facility-administered medications on file prior to encounter.    Current Outpatient Prescriptions on File Prior to Encounter  Medication Sig  . albuterol (PROAIR HFA) 108 (90 Base) MCG/ACT inhaler Inhale 2 puffs into the lungs every 4 (four) hours as needed for wheezing or shortness of breath.  . ALPRAZolam (XANAX) 0.25 MG tablet Take 1 tablet (0.25 mg total) by mouth 3 (three) times daily as needed for anxiety.  [name]. aspirin EC 81 MG tablet Take 81 mg by mouth daily.  [name]. atorvastatin (LIPITOR) 40 MG tablet Take 40 mg by mouth daily.  . bisoprolol (ZEBETA) 5 MG tablet Take 0.5 tablets (2.5 mg total) by mouth at bedtime.  . clopidogrel (PLAVIX) 75 MG tablet Take 75 mg by mouth daily.   . digoxin (LANOXIN) 0.125 MG tablet Take 1 tablet (0.125 mg total) by mouth daily.  . furosemide (LASIX) 40 MG tablet Take 40 mg by mouth See admin instructions. Take 1 tablet (40 mg) by mouth daily, may also take an additional tablet as needed for fluid buildup  . hydrOXYzine (VISTARIL) 25 MG capsule Take 25 mg by mouth at bedtime as needed (sleep).  . nitroGLYCERIN (NITROSTAT) 0.4 MG SL tablet Place 1 tablet (0.4 mg total) under the tongue every 5 (five) minutes as needed. For chest pain  . sacubitril-valsartan (ENTRESTO) 24-26 MG Take 1 tablet by mouth 2 (two) times daily.  [name]. spironolactone (ALDACTONE) 25 MG tablet Take 1 tablet (25 mg total) by mouth daily.    FAMILY HISTORY:  Not available   SOCIAL HISTORY: He  reports that he quit smoking about 2 months ago.  His smoking use included Cigarettes. He has a 22.00 pack-year smoking history. He has never used smokeless tobacco. He reports that he does not drink alcohol or use drugs.  REVIEW OF SYSTEMS:   Not able   SUBJECTIVE:  No distress.  Resting on Vent.   VITAL SIGNS: BP (!) 102/53   Pulse 80   Temp 98.9 F (37.2 C) (Oral)   Resp 14   Ht 5\' 11"  (1.803 m)   Wt 90.8 kg (200 lb 2.8 oz)   SpO2 100%   BMI 27.92 kg/m   HEMODYNAMICS:    VENTILATOR SETTINGS: Vent Mode: PRVC FiO2 (%):  [40 %-100 %] 40 % Set Rate:  [14 bmp] 14 bmp Vt Set:  [600 mL] 600 mL PEEP:  [5 cmH20] 5 cmH20 Plateau Pressure:  [16 cmH20-20 cmH20] 17 cmH20  INTAKE / OUTPUT: I/O last 3 completed shifts: In: 1776.3 [I.V.:1726.3; IV Piggyback:50] Out: 688 [Urine:598; Emesis/NG output:90]  PHYSICAL EXAMINATION: General:  60 year old male, sedated Neuro:  Lethargic, sedated, follows commands  HEENT:  NCAT, no JVD. MMM Cardiovascular:  Pacer in place, a-paced, no MRG   Lungs: diminished breath sounds at bases, unlabored on vent  Abdomen:  Not tender. No OM  Musculoskeletal:  Equal st and bulk  Skin:  Warm and dry   LABS:  BMET  Recent Labs Lab 01/27/16 1155 01/28/16 0738  NA 140 138  K 3.4* 4.4  CL 107 107  CO2 22 27  BUN 14 13  CREATININE 1.19 1.23  GLUCOSE 144* 125*    Electrolytes  Recent Labs Lab 01/27/16 1155 01/28/16 0738  CALCIUM 9.4 8.7*  MG 1.7 1.9  PHOS  --  3.3    CBC  Recent Labs Lab 01/27/16 1155 01/28/16 0738  WBC 6.9 11.6*  HGB 13.6 13.4  HCT 40.6 40.7  PLT 219 251    Coag's  Recent Labs Lab 01/27/16 1155  APTT 31    Sepsis Markers No results for input(s): LATICACIDVEN, PROCALCITON, O2SATVEN in the last 168 hours.  ABG  Recent Labs Lab 01/27/16 1655  PHART 7.247*  PCO2ART 50.5*  PO2ART 135*    Liver Enzymes  Recent Labs Lab 01/27/16 1155 01/28/16 0738  AST 37  --   ALT 36  --   ALKPHOS 43  --   BILITOT 1.1  --   ALBUMIN 3.8 3.2*    Cardiac Enzymes  Recent Labs Lab 01/27/16 1803 01/27/16 2338 01/28/16 0738  TROPONINI 1.89* 2.50* 1.57*    Glucose No results for input(s): GLUCAP in the last 168 hours.  Imaging Dg Chest Port 1 View  Result Date:  01/27/2016 CLINICAL DATA:  Ventricular flutter and fibrillation with multiple ICD shocks. Intubation. EXAM: PORTABLE CHEST 1 VIEW COMPARISON:  01/27/2016 and prior exams FINDINGS: Cardiomegaly, left-sided ICD, defibrillator pads overlying the chest again noted. Pulmonary vascular congestion is noted with possible mild interstitial edema. An endotracheal tube is noted with tip 4 cm above the carina and NG tube entering the stomach with tip off the field of view now noted. There is no evidence of pneumothorax or pleural effusion. IMPRESSION: Cardiomegaly with pulmonary vascular congestion and possible mild interstitial edema. Support apparatus as described. Electronically Signed   By: Harmon PierJeffrey  Hu M.D.   On: 01/27/2016 15:42   Dg Chest Port 1 View  Result Date: 01/27/2016 CLINICAL DATA:  Evaluate pacemaker leads.ICD firing. EXAM: PORTABLE CHEST 1 VIEW COMPARISON:  12/16/2015 FINDINGS: Chronic cardiopericardial enlargement. Status post coronary stenting. Biventricular ICD/pacer from the left  with leads in stable position. Mild basilar interstitial crowding. There is no edema, consolidation, effusion, or pneumothorax. IMPRESSION: Biventricular ICD/ pacer has a stable appearance since 12/16/2015. Electronically Signed   By: Marnee Spring M.D.   On: 01/27/2016 12:10     STUDIES:    CULTURES: CXR > Biventricular ICD/ pacer has a stable appearance since 12/16/2015  ANTIBIOTICS:   SIGNIFICANT EVENTS: 12/4 > Admit to hospital in VT storm   LINES/TUBES:   DISCUSSION: 60 year old w/ ICM comes in 12/4 w/ sustained VT (shocked by ICD > 40 times). PCCM asked to see for intubation and sedation so that further cardiac evaluation can be done and further discomfort from cardioversion avoided.   ASSESSMENT / PLAN:  PULMONARY A:  Pulmonary edema in setting of decompensated HF -->electively intubated for hemodynamic support  P:   Full vent support Wean as tolerated  Plan to keep intubated until  tomorrow  CARDIOVASCULAR A:  ICM w/ EF 15% Sustained VT P:  Full vent support Rhythm eval and rx per cards.  Levophed to maintain MAP >65   RENAL A:   Hypokalemia  Boarder line hypomagnesemia  P:   Replace K and Mg Strict I&O Avoid hypotension  Lasix per Cards   GASTROINTESTINAL A:   No acute  P:   H2B NPO  HEMATOLOGIC A:   No acute  P:  Trend cbc  Transfuse per protocol [facility] [place] heparin   INFECTIOUS A:   No evidence of infection  P:   Trend fever and wbc curve   ENDOCRINE A:   Hyperglycemia  P:   ssi   NEUROLOGIC A:   Anxiety and pain  P:   RASS goal: -1 to -2 PAD protocol; will use precedex and PRN fent    FAMILY  - Updates: Family at bedside and updated 12/5.   - Inter-disciplinary family meet or Palliative Care meeting due by: 12/11  My critical care time 31 minutes   Jovita Kussmaul, AG-ACNP [facility]  Pgr: [phone]  PCCM Pgr: [phone]  Attending Note:  I have examined patient, reviewed labs, studies and notes. I have discussed the case with Idolina Primer, and I agree with the data and plans as amended above. 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 Experienced multiple AICD shocks for flutter and VF. Required sedation and intubation in order to further manage. Started on lidocaine and amiodarone gtt's, norepi gtt. On my eval he will wake to voice on fentanyl gtt. He is writing notes on board. Lungs coarse B. CXR 12/4 showed pulm edema. Will plan to continue to wean norepi. Note lasix ordered for 12/5. Follow CXR for clearance pulm edema and then wake up for possible SBT's 12/6 as long as rhythm is stable.   Independent critical care time is 35 minutes.   Levy Pupa, MD, PhD 01/28/2016, 11:10 AM [facility] [phone] or if no answer [phone]

## 2016-01-29 ENCOUNTER — Inpatient Hospital Stay (HOSPITAL_COMMUNITY): Payer: Medicaid Other

## 2016-01-29 ENCOUNTER — Encounter: Payer: Self-pay | Admitting: Nurse Practitioner

## 2016-01-29 DIAGNOSIS — J810 Acute pulmonary edema: Secondary | ICD-10-CM

## 2016-01-29 DIAGNOSIS — J811 Chronic pulmonary edema: Secondary | ICD-10-CM

## 2016-01-29 LAB — POTASSIUM: POTASSIUM: 3.7 mmol/L (ref 3.5–5.1)

## 2016-01-29 LAB — BASIC METABOLIC PANEL
Anion gap: 4 — ABNORMAL LOW (ref 5–15)
BUN: 13 mg/dL (ref 6–20)
CALCIUM: 8.5 mg/dL — AB (ref 8.9–10.3)
CO2: 29 mmol/L (ref 22–32)
CREATININE: 1.14 mg/dL (ref 0.61–1.24)
Chloride: 101 mmol/L (ref 101–111)
GFR calc Af Amer: >60 mL/min (ref >60)
GLUCOSE: 104 mg/dL — AB (ref 65–99)
POTASSIUM: 3.7 mmol/L (ref 3.5–5.1)
SODIUM: 134 mmol/L — AB (ref 135–145)

## 2016-01-29 LAB — CBC WITH DIFFERENTIAL/PLATELET
Basophils Absolute: 0 10*3/uL (ref 0.0–0.1)
Basophils Relative: 0 %
EOS ABS: 0.3 10*3/uL (ref 0.0–0.7)
EOS PCT: 3 %
HCT: 34.4 % — ABNORMAL LOW (ref 39.0–52.0)
Hemoglobin: 11.6 g/dL — ABNORMAL LOW (ref 13.0–17.0)
LYMPHS ABS: 1.6 10*3/uL (ref 0.7–4.0)
Lymphocytes Relative: 19 %
MCH: 30 pg (ref 26.0–34.0)
MCHC: 33.7 g/dL (ref 30.0–36.0)
MCV: 88.9 fL (ref 78.0–100.0)
MONO ABS: 0.9 10*3/uL (ref 0.1–1.0)
Monocytes Relative: 10 %
Neutro Abs: 5.7 10*3/uL (ref 1.7–7.7)
Neutrophils Relative %: 68 %
PLATELETS: 164 10*3/uL (ref 150–400)
RBC: 3.87 MIL/uL — AB (ref 4.22–5.81)
RDW: 14.8 % (ref 11.5–15.5)
WBC: 8.4 10*3/uL (ref 4.0–10.5)

## 2016-01-29 LAB — RENAL FUNCTION PANEL
Albumin: 2.8 g/dL — ABNORMAL LOW (ref 3.5–5.0)
Anion gap: 5 (ref 5–15)
BUN: 12 mg/dL (ref 6–20)
CALCIUM: 8.4 mg/dL — AB (ref 8.9–10.3)
CO2: 27 mmol/L (ref 22–32)
CREATININE: 1.14 mg/dL (ref 0.61–1.24)
Chloride: 102 mmol/L (ref 101–111)
Glucose, Bld: 105 mg/dL — ABNORMAL HIGH (ref 65–99)
Phosphorus: 2.8 mg/dL (ref 2.5–4.6)
Potassium: 3.8 mmol/L (ref 3.5–5.1)
SODIUM: 134 mmol/L — AB (ref 135–145)

## 2016-01-29 LAB — MAGNESIUM
Magnesium: 1.8 mg/dL (ref 1.7–2.4)
Magnesium: 1.7 mg/dL (ref 1.7–2.4)

## 2016-01-29 MED ORDER — POTASSIUM CHLORIDE CRYS ER 20 MEQ PO TBCR
40.0000 meq | EXTENDED_RELEASE_TABLET | Freq: Every day | ORAL | Status: DC
  Administered 2016-01-29 – 2016-02-02 (×5): 40 meq via ORAL
  Filled 2016-01-29 (×5): qty 2

## 2016-01-29 MED ORDER — POTASSIUM CHLORIDE 20 MEQ/15ML (10%) PO SOLN
40.0000 meq | Freq: Two times a day (BID) | ORAL | Status: DC
  Administered 2016-01-29: 40 meq
  Filled 2016-01-29: qty 30

## 2016-01-29 MED ORDER — ALPRAZOLAM 0.25 MG PO TABS
0.2500 mg | ORAL_TABLET | Freq: Every day | ORAL | Status: DC
  Administered 2016-01-29 – 2016-02-01 (×4): 0.25 mg via ORAL
  Filled 2016-01-29 (×5): qty 1

## 2016-01-29 MED ORDER — POLYVINYL ALCOHOL 1.4 % OP SOLN
2.0000 [drp] | OPHTHALMIC | Status: DC | PRN
Start: 2016-01-29 — End: 2016-02-02
  Administered 2016-01-29: 2 [drp] via OPHTHALMIC
  Filled 2016-01-29: qty 15

## 2016-01-29 MED ORDER — POTASSIUM CHLORIDE 20 MEQ PO PACK
40.0000 meq | PACK | Freq: Two times a day (BID) | ORAL | Status: DC
  Filled 2016-01-29: qty 2

## 2016-01-29 MED ORDER — MAGNESIUM SULFATE 2 GM/50ML IV SOLN
2.0000 g | Freq: Once | INTRAVENOUS | Status: AC
  Administered 2016-01-29: 2 g via INTRAVENOUS
  Filled 2016-01-29: qty 50

## 2016-01-29 MED ORDER — FUROSEMIDE 10 MG/ML IJ SOLN
40.0000 mg | Freq: Once | INTRAMUSCULAR | Status: AC
  Administered 2016-01-29: 40 mg via INTRAVENOUS
  Filled 2016-01-29: qty 4

## 2016-01-29 NOTE — Progress Notes (Addendum)
Patient ID: [name], male   DOB: 11-Dec-1955, 60 y.o.   MRN: [id number]   HPI: [name] Guess Swiftis a 60 y.o.malewith PMHx of CAD, ICM with intermittent CHB, RBBB, upgrade to CRT device in October, chronic CHF, active smoker, HTN. He was doing well when last seen in CHF clinic.  He reports that over the weekend he had a couple of episodes of dizziness, attributed tohis nicotine patch, better after taking it off. He was at "the shop" getting ready to go into a shed when he started feeling weak and near syncopal, felt hiself get shocked, and sounds like was in-out feeling himself get shocked about 5 times he thinks, reportedly 5 witnessed by EMS as well.  ICD interrogation in ED showed 41 total shocks. Rhythms included VFlutter that was shocked and degenerated into VF per EP.   With repeated shocks, was intubated and sedated. Placed on amiodarone and lidocaine gtts.   Remains intubated and with some sedation on board, though wakes to stimulus and responds to questioning.  He is now on norepinephrine 3.   No VT overnight, occasional PVCs.    Good diuresis with 1 dose Lasix yesterday.   LV lead turned off, ?pro-arrhythmia.  Now a-paced, v-sensed (set DDD).   Scheduled Meds: . aspirin EC  81 mg Oral Daily  . atorvastatin  40 mg Oral Daily  . chlorhexidine gluconate (MEDLINE KIT)  15 mL Mouth Rinse BID  . clopidogrel  75 mg Oral Daily  . digoxin  0.125 mg Oral Daily  . enoxaparin (LOVENOX) injection  40 mg Subcutaneous Q24H  . famotidine (PEPCID) IV  20 mg Intravenous Q12H  . fluorescein  1 strip Both Eyes Once  . furosemide  40 mg Intravenous Once  . mouth rinse  15 mL Mouth Rinse QID  . potassium chloride  40 mEq Per Tube BID   Continuous Infusions: . amiodarone 30 mg/hr (01/29/16 0737)  . dexmedetomidine 0.9 mcg/kg/hr (01/29/16 0540)  . fentaNYL infusion INTRAVENOUS 25 mcg/hr (01/29/16 0740)  . lidocaine 1 mg/min (01/28/16 2149)  . norepinephrine (LEVOPHED) Adult infusion 2  mcg/min (01/29/16 0740)  . phenylephrine (NEO-SYNEPHRINE) Adult infusion     PRN Meds:.acetaminophen, albuterol, fentaNYL (SUBLIMAZE) injection, fentaNYL (SUBLIMAZE) injection, midazolam, nitroGLYCERIN, ondansetron (ZOFRAN) IV    Vitals:   01/29/16 0328 01/29/16 0400 01/29/16 0500 01/29/16 0600  BP: 99/67 97/67 103/67   Pulse: 80 79 79 79  Resp: _0 Temp: 99 F (37.2 C)     TempSrc: Oral     SpO2: 99% 99% 99% 99%  Weight:      Height:        Intake/Output Summary (Last 24 hours) at 01/29/16 0740 Last data filed at 01/29/16 0700  Gross per 24 hour  Intake          1812.79 ml  Output             2875 ml  Net         -1062.21 ml    LABS: Basic Metabolic Panel:  Recent Labs  01/27/16 1155 01/28/16 0738  NA 140 138  K 3.4* 4.4  CL 107 107  CO2 22 27  GLUCOSE 144* 125*  BUN 14 13  CREATININE 1.19 1.23  CALCIUM 9.4 8.7*  MG 1.7 1.9  PHOS  --  3.3   Liver Function Tests:  Recent Labs  01/27/16 1155 01/28/16 0738  AST 37  --   ALT 36  --   ALKPHOS 43  --  BILITOT 1.1  --   PROT 7.3  --   ALBUMIN 3.8 3.2*   No results for input(s): LIPASE, AMYLASE in the last 72 hours. CBC:  Recent Labs  01/27/16 1155 01/28/16 0738  WBC 6.9 11.6*  NEUTROABS  --  8.2*  HGB 13.6 13.4  HCT 40.6 40.7  MCV 88.6 89.6  PLT 219 251   Cardiac Enzymes:  Recent Labs  01/27/16 1803 01/27/16 2338 01/28/16 0738  TROPONINI 1.89* 2.50* 1.57*   BNP: Invalid input(s): POCBNP D-Dimer: No results for input(s): DDIMER in the last 72 hours. Hemoglobin A1C: No results for input(s): HGBA1C in the last 72 hours. Fasting Lipid Panel: No results for input(s): CHOL, HDL, LDLCALC, TRIG, CHOLHDL, LDLDIRECT in the last 72 hours. Thyroid Function Tests: No results for input(s): TSH, T4TOTAL, T3FREE, THYROIDAB in the last 72 hours.  Invalid input(s): FREET3 Anemia Panel: No results for input(s): VITAMINB12, FOLATE, FERRITIN, TIBC, IRON, RETICCTPCT in the last 72  hours.  RADIOLOGY: Dg Chest Port 1 View  Result Date: 01/29/2016 CLINICAL DATA:  Pulmonary edema EXAM: PORTABLE CHEST 1 VIEW COMPARISON:  Two days ago FINDINGS: Endotracheal tube tip between the clavicular heads and carina. An orogastric tube reaches the stomach. Biventricular ICD/ pacer in stable position. Cardiomegaly. Coronary stenting. Low volume chest with congested appearance of the vasculature. No Kerley line, air bronchogram effusion, or pneumothorax. IMPRESSION: 1. Stable positioning of endotracheal and orogastric tubes. 2. Stable cardiomegaly and vascular congestion. Electronically Signed   By: Monte Fantasia M.D.   On: 01/29/2016 07:12   Dg Chest Port 1 View  Result Date: 01/27/2016 CLINICAL DATA:  Ventricular flutter and fibrillation with multiple ICD shocks. Intubation. EXAM: PORTABLE CHEST 1 VIEW COMPARISON:  01/27/2016 and prior exams FINDINGS: Cardiomegaly, left-sided ICD, defibrillator pads overlying the chest again noted. Pulmonary vascular congestion is noted with possible mild interstitial edema. An endotracheal tube is noted with tip 4 cm above the carina and NG tube entering the stomach with tip off the field of view now noted. There is no evidence of pneumothorax or pleural effusion. IMPRESSION: Cardiomegaly with pulmonary vascular congestion and possible mild interstitial edema. Support apparatus as described. Electronically Signed   By: Margarette Canada M.D.   On: 01/27/2016 15:42   Dg Chest Port 1 View  Result Date: 01/27/2016 CLINICAL DATA:  Evaluate pacemaker leads.ICD firing. EXAM: PORTABLE CHEST 1 VIEW COMPARISON:  12/16/2015 FINDINGS: Chronic cardiopericardial enlargement. Status post coronary stenting. Biventricular ICD/pacer from the left with leads in stable position. Mild basilar interstitial crowding. There is no edema, consolidation, effusion, or pneumothorax. IMPRESSION: Biventricular ICD/ pacer has a stable appearance since 12/16/2015. Electronically Signed   By:  Monte Fantasia M.D.   On: 01/27/2016 12:10    PHYSICAL EXAM General: NAD, intubated Neck: JVP difficult, no thyromegaly or thyroid nodule.  Lungs: Decreased at bases bilaterally CV: Nondisplaced PMI.  Heart regular S1/S2, no S3/S4, no murmur.  No peripheral edema.   Abdomen: Soft, nontender, no hepatosplenomegaly, no distention.  Neurologic: Sedated but awakens  Psych: Normal affect. Extremities: No clubbing or cyanosis.   TELEMETRY: Reviewed telemetry pt in a-paced, v-sensed rhythm, no VT  ASSESSMENT AND PLAN: 60 yo with history of CAD, ischemic cardiomyopathy admitted with VT storm.  No clear trigger.  Elevated troponin but mild and likely due to multiple shocks.  Prior to VT, he had not noted chest pain or increased dyspnea.  Overall, had been doing well.  1. VT storm: Quiet now on amiodarone and lidocaine gtts.  ?Trigger =>  K was mildly low but should not have caused.  ?Triggered by BiV pacing.  ?Ischemia but no chest pain and doubt ACS.   - Continue amiodarone/lidocaine per EP => ?come off lidocaine today.  Lidocaine level 1.5, ok.  - LV lead off.  Has underlying wide RBBB.  In DDD mode, currently pacing atrium.  - Will likely do coronary angiography this admission.   - Await labs this am for K/Mg.   2. Acute on chronic systolic CHF: Ischemic cardiomyopathy.  Echo 10/17 with EF 15%.  Had admission in 10/17 with low output noted in setting of bradycardia/high grade heart block.  He had been much improved with pacing, was NYHA class II-III at last office appt.  Currently with pulmonary edema on CXR, suspect stunning with worsening of cardiac function post-multiple shocks.  He is currently on norepinephrine (titrating off) => down to 3.  - Continue digoxin.  - Hopefully can wean off norepinephrine this morning, only on low dose.  If not, would consider central access placement.  - Will give Lasix 40 mg IV x 1 again now.  3. CAD: No chest pain prior to event, stable cath in 9/17.  Mild  troponin elevation likely due to multiple shocks, doubt ACS. However, given VT storm would consider eventual re-look at coronaries.   Continue ASA, statin, Plavix.  4. Rhythm: As above, history of high grade heart block.  LV lead turned off, DDD mode currently.  5. Pulmonary: Sedated given multiple shocks and therefore intubated for full support.  Wean vent per CCM, hopefully extubate today.   Loralie Champagne 01/29/2016 7:46 AM

## 2016-01-29 NOTE — Progress Notes (Signed)
SUBJECTIVE: The patient is intubated, sedated, but responds to verbal, opens eyes and follows direction.  [name] aspirin EC  81 mg Oral Daily  . atorvastatin  40 mg Oral Daily  . chlorhexidine gluconate (MEDLINE KIT)  15 mL Mouth Rinse BID  . clopidogrel  75 mg Oral Daily  . digoxin  0.125 mg Oral Daily  . enoxaparin (LOVENOX) injection  40 mg Subcutaneous Q24H  . famotidine (PEPCID) IV  20 mg Intravenous Q12H  . fluorescein  1 strip Both Eyes Once  . furosemide  40 mg Intravenous Once  . mouth rinse  15 mL Mouth Rinse QID  . potassium chloride  40 mEq Per Tube BID   . amiodarone 30 mg/hr (01/29/16 0737)  . dexmedetomidine 0.9 mcg/kg/hr (01/29/16 0540)  . fentaNYL infusion INTRAVENOUS 25 mcg/hr (01/29/16 0740)  . lidocaine 1 mg/min (01/28/16 2149)  . norepinephrine (LEVOPHED) Adult infusion 2 mcg/min (01/29/16 0740)  . phenylephrine (NEO-SYNEPHRINE) Adult infusion      OBJECTIVE: Physical Exam: Vitals:   01/29/16 0328 01/29/16 0400 01/29/16 0500 01/29/16 0600  BP: 99/67 97/67 103/67   Pulse: 80 79 79 79  Resp: 14 14 14 14   Temp: 99 F (37.2 C)     TempSrc: Oral     SpO2: 99% 99% 99% 99%  Weight:      Height:        Intake/Output Summary (Last 24 hours) at 01/29/16 0748 Last data filed at 01/29/16 0700  Gross per 24 hour  Intake          1812.79 ml  Output             2875 ml  Net         -1062.21 ml    Telemetry Personally reviewed  No VT  GEN- The patient intubated, awake , responds to verbal.   Head- normocephalic, slight abrasion left cheek Eyes-  Sclera clear, conjunctiva pink Ears- hearing intact Oropharynx- ETT Neck- supple,  Lungs- diminished at the bases, intubated Heart- Regular rate and rhythm, no significant murmurs, no rubs or gallops GI- soft, NT, ND Extremities- no clubbing, cyanosis, or edema Skin- no rash or lesion Psych- euthymic mood, full affect Neuro- unable to examine  LABS: Basic Metabolic Panel:  Recent Labs  01/27/16 1155  01/28/16 0738  NA 140 138  K 3.4* 4.4  CL 107 107  CO2 22 27  GLUCOSE 144* 125*  BUN 14 13  CREATININE 1.19 1.23  CALCIUM 9.4 8.7*  MG 1.7 1.9  PHOS  --  3.3   Liver Function Tests:  Recent Labs  01/27/16 1155 01/28/16 0738  AST 37  --   ALT 36  --   ALKPHOS 43  --   BILITOT 1.1  --   PROT 7.3  --   ALBUMIN 3.8 3.2*   CBC:  Recent Labs  01/27/16 1155 01/28/16 0738  WBC 6.9 11.6*  NEUTROABS  --  8.2*  HGB 13.6 13.4  HCT 40.6 40.7  MCV 88.6 89.6  PLT 219 251   Cardiac Enzymes:  Recent Labs  01/27/16 1803 01/27/16 2338 01/28/16 0738  TROPONINI 1.89* 2.50* 1.57*    RADIOLOGY: Dg Chest Port 1 View Result Date: 01/27/2016 CLINICAL DATA:  Ventricular flutter and fibrillation with multiple ICD shocks. Intubation. EXAM: PORTABLE CHEST 1 VIEW COMPARISON:  01/27/2016 and prior exams FINDINGS: Cardiomegaly, left-sided ICD, defibrillator pads overlying the chest again noted. Pulmonary vascular congestion is noted with possible mild interstitial edema. An endotracheal tube  is noted with tip 4 cm above the carina and NG tube entering the stomach with tip off the field of view now noted. There is no evidence of pneumothorax or pleural effusion. IMPRESSION: Cardiomegaly with pulmonary vascular congestion and possible mild interstitial edema. Support apparatus as described. Electronically Signed   By: Margarette Canada M.D.   On: 01/27/2016 15:42     ASSESSMENT AND PLAN:   1. Syncope/VT/VF storm    >40 shocks   2. No prior complaints of CP   3. ICM  4. ICD site   5. R eye pain, E noted some dirt/debris, flushed out in ED     No further VT  Will continue amio and lido for now with plan to transition to mex/amio when taking PO. Agree with extubation today,  It appears we have VT suppressed Agree with cath, looking for potential trigger  If nothing found, suspect electromechanical issues which bodes ill

## 2016-01-29 NOTE — Procedures (Signed)
Extubation Procedure Note  Patient Details:   Name: [name] DOB: 08/31/1955 MRN: [id number]   Airway Documentation:     Evaluation  O2 sats: stable throughout Complications: No apparent complications Patient did tolerate procedure well. Bilateral Breath Sounds: Diminished, Rhonchi   Yes   Pt extubated per MD order and placed on 2L nasal cannula.  Pt tolerating well at this time.  RT will continue to monitor.   Tylene FantasiaHaley, Margurite Duffy Leigh 01/29/2016, 9:35 AM

## 2016-01-29 NOTE — Progress Notes (Signed)
PULMONARY / CRITICAL CARE MEDICINE   Name: [name] MRN: [id number] DOB: 04/16/1955    ADMISSION DATE:  01/27/2016 CONSULTATION DATE:  12/4  REFERRING MD:  Graciela HusbandsKlein   CHIEF COMPLAINT: sustained VT  Brief:    60 y.o. male with PMHx of CAD, ICM with intermittent CHB, RBBB, upgrade to CRT device in October 2017, chronic CHF, active smoker, HTN.  He reported that over the weekend he had a couple of episodes of dizziness, attributed to his nicotine patch, better after taking it off.  He was at "the shop" getting ready to go into a shed when he started feeling weak and near syncopal, felt hiself get shocked, and sounds like was in-out feeling himself get shocked about 5 times he thinks, reportedly 5 witnessed by EMS as well. -Interrogation showed 41 total shocks reviewed with Dr. Graciela HusbandsKlein, appears V-Flutter that was shocked and degenerated into VF.  All are appropriate.  -PCCM was asked to see to sedate and ventilate to keep the pt comfortable as further cardiac evaluation takes place.   PAST MEDICAL HISTORY :  He  has a past medical history of Anxiety; Automatic implantable cardioverter-defibrillator in situ; CAD (coronary artery disease); Chronic systolic CHF (congestive heart failure), NYHA class 3 (HCC) - low output state (12/12/2014); Depression; Dyspnea; History of rheumatic fever (1962); HLD (hyperlipidemia) (mixed); HTN (hypertension); Ischemic cardiomyopathy; Myocardial infarction (1998; 2002; ~ 2010); Paroxysmal ventricular tachycardia (HCC); PVC's (premature ventricular contractions); RBBB; and TIA (transient ischemic attack).  PAST SURGICAL HISTORY: He  has a past surgical history that includes Cardiac defibrillator placement; Laparoscopic cholecystectomy; Hernia repair; percutaneous coronary stent intervention (pci-s) (N/A, 06/01/2011); left heart catheterization with coronary angiogram (N/A, 06/08/2011); percutaneous coronary stent intervention (pci-s) (N/A, 08/29/2012); Cardiac catheterization  (N/A, 10/29/2015); Cardiac catheterization (N/A, 12/11/2015); Cardiac catheterization (N/A, 12/11/2015); Cardiac catheterization (12/11/2015); and Cardiac catheterization (N/A, 12/11/2015).  No Known Allergies  No current facility-administered medications on file prior to encounter.    Current Outpatient Prescriptions on File Prior to Encounter  Medication Sig  . albuterol (PROAIR HFA) 108 (90 Base) MCG/ACT inhaler Inhale 2 puffs into the lungs every 4 (four) hours as needed for wheezing or shortness of breath.  . ALPRAZolam (XANAX) 0.25 MG tablet Take 1 tablet (0.25 mg total) by mouth 3 (three) times daily as needed for anxiety.  [name]. aspirin EC 81 MG tablet Take 81 mg by mouth daily.  [name]. atorvastatin (LIPITOR) 40 MG tablet Take 40 mg by mouth daily.  . bisoprolol (ZEBETA) 5 MG tablet Take 0.5 tablets (2.5 mg total) by mouth at bedtime.  . clopidogrel (PLAVIX) 75 MG tablet Take 75 mg by mouth daily.   . digoxin (LANOXIN) 0.125 MG tablet Take 1 tablet (0.125 mg total) by mouth daily.  . furosemide (LASIX) 40 MG tablet Take 40 mg by mouth See admin instructions. Take 1 tablet (40 mg) by mouth daily, may also take an additional tablet as needed for fluid buildup  . hydrOXYzine (VISTARIL) 25 MG capsule Take 25 mg by mouth at bedtime as needed (sleep).  . nitroGLYCERIN (NITROSTAT) 0.4 MG SL tablet Place 1 tablet (0.4 mg total) under the tongue every 5 (five) minutes as needed. For chest pain  . sacubitril-valsartan (ENTRESTO) 24-26 MG Take 1 tablet by mouth 2 (two) times daily.  [name]. spironolactone (ALDACTONE) 25 MG tablet Take 1 tablet (25 mg total) by mouth daily.    FAMILY HISTORY:  Not available   SOCIAL HISTORY: He  reports that he quit smoking about 2 months ago.  His smoking use included Cigarettes. He has a 22.00 pack-year smoking history. He has never used smokeless tobacco. He reports that he does not drink alcohol or use drugs.  REVIEW OF SYSTEMS:   Not able   SUBJECTIVE:  No events  overnight. Sedation off. Tolerating wean.   VITAL SIGNS: BP (!) 73/57 (BP Location: Right Arm)   Pulse 81   Temp 98.8 F (37.1 C) (Oral)   Resp 20   Ht 5\' 11"  (1.803 m)   Wt 90.8 kg (200 lb 2.8 oz)   SpO2 99%   BMI 27.92 kg/m   HEMODYNAMICS:    VENTILATOR SETTINGS: Vent Mode: CPAP;PSV FiO2 (%):  [40 %] 40 % Set Rate:  [14 bmp] 14 bmp Vt Set:  [600 mL] 600 mL PEEP:  [5 cmH20] 5 cmH20 Pressure Support:  [5 cmH20] 5 cmH20 Plateau Pressure:  [12 cmH20-18 cmH20] 16 cmH20  INTAKE / OUTPUT: I/O last 3 completed shifts: In: 3589.1 [I.V.:3289.1; NG/GT:50; IV Piggyback:250] Out: 1610 [RUEAV:[id number] [Urine:3473; Emesis/NG output:50]  PHYSICAL EXAMINATION: General:  60 year old male, awake, no distress  Neuro:  alert, follows commands  HEENT:  NCAT, no JVD. MMM Cardiovascular:  Pacer in place, a-paced, no MRG   Lungs: crackles at bases, unlabored on vent  Abdomen:  Not tender. No OM  Musculoskeletal:  Equal st and bulk  Skin:  Warm and dry   LABS:  BMET  Recent Labs Lab 01/27/16 1155 01/28/16 0738  NA 140 138  K 3.4* 4.4  CL 107 107  CO2 22 27  BUN 14 13  CREATININE 1.19 1.23  GLUCOSE 144* 125*    Electrolytes  Recent Labs Lab 01/27/16 1155 01/28/16 0738  CALCIUM 9.4 8.7*  MG 1.7 1.9  PHOS  --  3.3    CBC  Recent Labs Lab 01/27/16 1155 01/28/16 0738 01/29/16 0733  WBC 6.9 11.6* 8.4  HGB 13.6 13.4 11.6*  HCT 40.6 40.7 34.4*  PLT 219 251 164    Coag's  Recent Labs Lab 01/27/16 1155  APTT 31    Sepsis Markers No results for input(s): LATICACIDVEN, PROCALCITON, O2SATVEN in the last 168 hours.  ABG  Recent Labs Lab 01/27/16 1655  PHART 7.247*  PCO2ART 50.5*  PO2ART 135*    Liver Enzymes  Recent Labs Lab 01/27/16 1155 01/28/16 0738  AST 37  --   ALT 36  --   ALKPHOS 43  --   BILITOT 1.1  --   ALBUMIN 3.8 3.2*    Cardiac Enzymes  Recent Labs Lab 01/27/16 1803 01/27/16 2338 01/28/16 0738  TROPONINI 1.89* 2.50* 1.57*     Glucose  Recent Labs Lab 01/28/16 2035  GLUCAP 97    Imaging Dg Chest Port 1 View  Result Date: 01/29/2016 CLINICAL DATA:  Pulmonary edema EXAM: PORTABLE CHEST 1 VIEW COMPARISON:  Two days ago FINDINGS: Endotracheal tube tip between the clavicular heads and carina. An orogastric tube reaches the stomach. Biventricular ICD/ pacer in stable position. Cardiomegaly. Coronary stenting. Low volume chest with congested appearance of the vasculature. No Kerley line, air bronchogram effusion, or pneumothorax. IMPRESSION: 1. Stable positioning of endotracheal and orogastric tubes. 2. Stable cardiomegaly and vascular congestion. Electronically Signed   By: Marnee SpringJonathon  Watts M.D.   On: 01/29/2016 07:12     STUDIES:    CULTURES: CXR > Biventricular ICD/ pacer has a stable appearance since 12/16/2015  ANTIBIOTICS:   SIGNIFICANT EVENTS: 12/4 > Admit to hospital in VT storm   LINES/TUBES:   DISCUSSION: 60 year  old w/ ICM comes in 12/4 w/ sustained VT (shocked by ICD > 40 times). PCCM asked to see for intubation and sedation so that further cardiac evaluation can be done and further discomfort from cardioversion avoided.   ASSESSMENT / PLAN:  PULMONARY A:  Pulmonary edema in setting of decompensated HF -->electively intubated for hemodynamic support  P:   Full vent support Wean as tolerated  Extubate this am  CARDIOVASCULAR A:  ICM w/ EF 15% Sustained VT P:  Full vent support Rhythm eval and rx per cards.  Levophed to maintain MAP >65   RENAL A:   Hypokalemia  Boarder line hypomagnesemia  P:   Replace electrolytes as needed  Strict I&O Lasix per Cards   GASTROINTESTINAL A:   No acute  P:   H2B NPO  HEMATOLOGIC A:   No acute  P:  Trend cbc  Transfuse per protocol Place on [place] heparin   INFECTIOUS A:   No evidence of infection  P:   Trend fever and wbc curve   ENDOCRINE A:   Hyperglycemia  P:   ssi   NEUROLOGIC A:   Anxiety and pain  P:    RASS goal: 0  D/C fent and precedex    FAMILY  - Updates: Family at bedside and updated 12/6.   - Inter-disciplinary family meet or Palliative Care meeting due by: 12/11  My critical care time 30 minutes   Jovita KussmaulKatalina Eubanks, AG-ACNP [facility]  Pgr: [phone]  PCCM Pgr: [phone]   Attending Note:  I have examined patient, reviewed labs, studies and notes. I have discussed the case with Idolina PrimerK Eubanks, and I agree with the data and plans as amended above. 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 Experienced multiple AICD shocks for flutter and VF. Required sedation and intubation in order to further manage. Started on lidocaine and amiodarone gtt's, norepi gtt. On my eval he is wide awake, is tolerating PSV well. He remains in shock on low dose norepi. Lungs coarse B. We will plan to extubate today, continue to attempt to wean pressors. Follow through the day today with cardiology.  Independent critical care time is 35 minutes.   Levy Pupaobert Yittel Emrich, MD, PhD 01/29/2016, 10:13 AM [facility] [phone] or if no answer [phone]

## 2016-01-29 NOTE — Progress Notes (Signed)
Pt in VT up to 130. BP stable, pt mentating good. Complains of anxiety. Allred, MD and Sharol HarnessSimmons, PA paged. Verbal order from Allred, MD to give amio bolus, then repeat. Sharol HarnessSimmons, PA to bedside to evaluate.  HR after 2nd bolus of amio now 94, BP 110/64. Room air.  Will continue to monitor closely.

## 2016-01-29 NOTE — Progress Notes (Signed)
225 ml Fentanyl wasted in sink with Aundra MilletMegan, Charity fundraiserN.  Chika Cichowski N. Jamelle HaringSnow

## 2016-01-30 ENCOUNTER — Encounter (HOSPITAL_COMMUNITY)
Admission: EM | Disposition: A | Discharge status: Home / Self Care | Payer: Self-pay | Source: Home / Self Care | Attending: Internal Medicine

## 2016-01-30 DIAGNOSIS — F419 Anxiety disorder, unspecified: Secondary | ICD-10-CM

## 2016-01-30 DIAGNOSIS — I2510 Atherosclerotic heart disease of native coronary artery without angina pectoris: Secondary | ICD-10-CM

## 2016-01-30 DIAGNOSIS — F4310 Post-traumatic stress disorder, unspecified: Secondary | ICD-10-CM

## 2016-01-30 LAB — RENAL FUNCTION PANEL
ALBUMIN: 3.2 g/dL — AB (ref 3.5–5.0)
Anion gap: 10 (ref 5–15)
BUN: 12 mg/dL (ref 6–20)
CALCIUM: 8.9 mg/dL (ref 8.9–10.3)
CO2: 25 mmol/L (ref 22–32)
CREATININE: 0.93 mg/dL (ref 0.61–1.24)
Chloride: 99 mmol/L — ABNORMAL LOW (ref 101–111)
GFR calc Af Amer: >60 mL/min (ref >60)
GFR calc non Af Amer: >60 mL/min (ref >60)
GLUCOSE: 88 mg/dL (ref 65–99)
PHOSPHORUS: 3.4 mg/dL (ref 2.5–4.6)
Potassium: 3.5 mmol/L (ref 3.5–5.1)
SODIUM: 134 mmol/L — AB (ref 135–145)

## 2016-01-30 LAB — PROTIME-INR
INR: 1.1
Prothrombin Time: 14.3 seconds (ref 11.4–15.2)

## 2016-01-30 LAB — CREATININE, SERUM: CREATININE: 1.07 mg/dL (ref 0.61–1.24)

## 2016-01-30 LAB — CBC
HEMATOCRIT: 33.8 % — AB (ref 39.0–52.0)
HEMOGLOBIN: 11.5 g/dL — AB (ref 13.0–17.0)
MCH: 29.6 pg (ref 26.0–34.0)
MCHC: 34 g/dL (ref 30.0–36.0)
MCV: 87.1 fL (ref 78.0–100.0)
Platelets: 169 10*3/uL (ref 150–400)
RBC: 3.88 MIL/uL — ABNORMAL LOW (ref 4.22–5.81)
RDW: 15 % (ref 11.5–15.5)
WBC: 6.9 10*3/uL (ref 4.0–10.5)

## 2016-01-30 LAB — CBC WITH DIFFERENTIAL/PLATELET
Basophils Absolute: 0 10*3/uL (ref 0.0–0.1)
Basophils Relative: 0 %
EOS ABS: 0.2 10*3/uL (ref 0.0–0.7)
Eosinophils Relative: 3 %
HCT: 36.6 % — ABNORMAL LOW (ref 39.0–52.0)
HEMOGLOBIN: 12.6 g/dL — AB (ref 13.0–17.0)
LYMPHS ABS: 1.6 10*3/uL (ref 0.7–4.0)
Lymphocytes Relative: 20 %
MCH: 29.9 pg (ref 26.0–34.0)
MCHC: 34.4 g/dL (ref 30.0–36.0)
MCV: 86.9 fL (ref 78.0–100.0)
MONOS PCT: 8 %
Monocytes Absolute: 0.6 10*3/uL (ref 0.1–1.0)
NEUTROS PCT: 69 %
Neutro Abs: 5.5 10*3/uL (ref 1.7–7.7)
Platelets: 172 10*3/uL (ref 150–400)
RBC: 4.21 MIL/uL — ABNORMAL LOW (ref 4.22–5.81)
RDW: 14.7 % (ref 11.5–15.5)
WBC: 8 10*3/uL (ref 4.0–10.5)

## 2016-01-30 LAB — MAGNESIUM: MAGNESIUM: 2.1 mg/dL (ref 1.7–2.4)

## 2016-01-30 SURGERY — CORONARY ANGIOGRAM

## 2016-01-30 MED ORDER — HEPARIN SODIUM (PORCINE) 1000 UNIT/ML IJ SOLN
INTRAMUSCULAR | Status: AC
  Filled 2016-01-30: qty 1

## 2016-01-30 MED ORDER — HEPARIN (PORCINE) IN NACL 2-0.9 UNIT/ML-% IJ SOLN
INTRAMUSCULAR | Status: DC | PRN
  Administered 2016-01-30: 1000 mL

## 2016-01-30 MED ORDER — FENTANYL CITRATE (PF) 100 MCG/2ML IJ SOLN
INTRAMUSCULAR | Status: DC | PRN
  Administered 2016-01-30 (×3): 25 ug via INTRAVENOUS

## 2016-01-30 MED ORDER — BISOPROLOL FUMARATE 5 MG PO TABS
2.5000 mg | ORAL_TABLET | Freq: Every day | ORAL | Status: DC
  Administered 2016-01-30 – 2016-02-02 (×4): 2.5 mg via ORAL
  Filled 2016-01-30 (×5): qty 1

## 2016-01-30 MED ORDER — LIDOCAINE HCL (PF) 1 % IJ SOLN
INTRAMUSCULAR | Status: DC | PRN
  Administered 2016-01-30: 3 mL

## 2016-01-30 MED ORDER — SODIUM CHLORIDE 0.9 % WEIGHT BASED INFUSION
1.0000 mL/kg/h | INTRAVENOUS | Status: AC
  Administered 2016-01-30 (×2): 1 mL/kg/h via INTRAVENOUS

## 2016-01-30 MED ORDER — MEXILETINE HCL 200 MG PO CAPS
200.0000 mg | ORAL_CAPSULE | Freq: Two times a day (BID) | ORAL | Status: DC
  Administered 2016-01-30 – 2016-02-02 (×7): 200 mg via ORAL
  Filled 2016-01-30 (×7): qty 1

## 2016-01-30 MED ORDER — MIDAZOLAM HCL 2 MG/2ML IJ SOLN
INTRAMUSCULAR | Status: DC | PRN
  Administered 2016-01-30 (×2): 1 mg via INTRAVENOUS

## 2016-01-30 MED ORDER — SODIUM CHLORIDE 0.9 % IV SOLN: INTRAVENOUS | Status: DC

## 2016-01-30 MED ORDER — SODIUM CHLORIDE 0.9% FLUSH: 3.0000 mL | INTRAVENOUS | Status: DC | PRN

## 2016-01-30 MED ORDER — SPIRONOLACTONE 25 MG PO TABS
12.5000 mg | ORAL_TABLET | Freq: Every day | ORAL | Status: DC
  Administered 2016-01-30 – 2016-02-02 (×4): 12.5 mg via ORAL
  Filled 2016-01-30 (×4): qty 1

## 2016-01-30 MED ORDER — HEPARIN SODIUM (PORCINE) 1000 UNIT/ML IJ SOLN
INTRAMUSCULAR | Status: DC | PRN
  Administered 2016-01-30: 5000 [IU] via INTRAVENOUS

## 2016-01-30 MED ORDER — VERAPAMIL HCL 2.5 MG/ML IV SOLN
INTRAVENOUS | Status: DC | PRN
  Administered 2016-01-30: 10 mL via INTRA_ARTERIAL

## 2016-01-30 MED ORDER — ALPRAZOLAM 0.5 MG PO TABS
0.5000 mg | ORAL_TABLET | Freq: Three times a day (TID) | ORAL | Status: DC | PRN
  Administered 2016-01-30 – 2016-02-01 (×5): 0.5 mg via ORAL
  Filled 2016-01-30 (×5): qty 1

## 2016-01-30 MED ORDER — LIDOCAINE HCL (PF) 1 % IJ SOLN
INTRAMUSCULAR | Status: AC
  Filled 2016-01-30: qty 30

## 2016-01-30 MED ORDER — FENTANYL CITRATE (PF) 100 MCG/2ML IJ SOLN
INTRAMUSCULAR | Status: AC
  Filled 2016-01-30: qty 2

## 2016-01-30 MED ORDER — SODIUM CHLORIDE 0.9 % IV SOLN: 250.0000 mL | INTRAVENOUS | Status: DC | PRN

## 2016-01-30 MED ORDER — ASPIRIN 81 MG PO CHEW
81.0000 mg | CHEWABLE_TABLET | ORAL | Status: AC
  Administered 2016-01-30: 81 mg via ORAL
  Filled 2016-01-30: qty 1

## 2016-01-30 MED ORDER — SODIUM CHLORIDE 0.9% FLUSH
3.0000 mL | Freq: Two times a day (BID) | INTRAVENOUS | Status: DC
  Administered 2016-01-31 – 2016-02-01 (×4): 3 mL via INTRAVENOUS

## 2016-01-30 MED ORDER — IOPAMIDOL (ISOVUE-370) INJECTION 76%
INTRAVENOUS | Status: DC | PRN
  Administered 2016-01-30: 120 mL via INTRAVENOUS

## 2016-01-30 MED ORDER — ACETAMINOPHEN 325 MG PO TABS: 650.0000 mg | ORAL_TABLET | ORAL | Status: DC | PRN

## 2016-01-30 MED ORDER — MIDAZOLAM HCL 2 MG/2ML IJ SOLN
INTRAMUSCULAR | Status: AC
  Filled 2016-01-30: qty 2

## 2016-01-30 MED ORDER — SODIUM CHLORIDE 0.9% FLUSH
3.0000 mL | Freq: Two times a day (BID) | INTRAVENOUS | Status: DC
  Administered 2016-01-30: 3 mL via INTRAVENOUS

## 2016-01-30 MED ORDER — ONDANSETRON HCL 4 MG/2ML IJ SOLN: 4.0000 mg | Freq: Four times a day (QID) | INTRAMUSCULAR | Status: DC | PRN

## 2016-01-30 MED ORDER — HEPARIN (PORCINE) IN NACL 2-0.9 UNIT/ML-% IJ SOLN
INTRAMUSCULAR | Status: AC
  Filled 2016-01-30: qty 1000

## 2016-01-30 MED ORDER — ALPRAZOLAM 0.25 MG PO TABS
0.2500 mg | ORAL_TABLET | Freq: Once | ORAL | Status: AC
  Administered 2016-01-30: 0.25 mg via ORAL

## 2016-01-30 MED ORDER — VERAPAMIL HCL 2.5 MG/ML IV SOLN
INTRAVENOUS | Status: AC
  Filled 2016-01-30: qty 2

## 2016-01-30 MED ORDER — HEPARIN SODIUM (PORCINE) 5000 UNIT/ML IJ SOLN
5000.0000 [IU] | Freq: Three times a day (TID) | INTRAMUSCULAR | Status: DC
  Administered 2016-01-30 – 2016-02-02 (×8): 5000 [IU] via SUBCUTANEOUS
  Filled 2016-01-30 (×8): qty 1

## 2016-01-30 MED ORDER — POTASSIUM CHLORIDE CRYS ER 20 MEQ PO TBCR
40.0000 meq | EXTENDED_RELEASE_TABLET | Freq: Once | ORAL | Status: AC
  Administered 2016-01-30: 40 meq via ORAL
  Filled 2016-01-30: qty 2

## 2016-01-30 MED ORDER — IOPAMIDOL (ISOVUE-370) INJECTION 76%
INTRAVENOUS | Status: AC
  Filled 2016-01-30: qty 50

## 2016-01-30 MED ORDER — SODIUM CHLORIDE 0.9 % IV SOLN
INTRAVENOUS | Status: DC
  Administered 2016-01-30: 09:00:00 via INTRAVENOUS

## 2016-01-30 SURGICAL SUPPLY — 10 items
CATH EXPO 5F FL3.5 (CATHETERS) ×3 IMPLANT
CATH INFINITI JR4 5F (CATHETERS) ×3 IMPLANT
DEVICE RAD COMP TR BAND LRG (VASCULAR PRODUCTS) ×3 IMPLANT
GLIDESHEATH SLEND SS 6F .021 (SHEATH) ×3 IMPLANT
GUIDEWIRE INQWIRE 1.5J.035X260 (WIRE) ×1 IMPLANT
INQWIRE 1.5J .035X260CM (WIRE) ×4
KIT HEART LEFT (KITS) ×4 IMPLANT
PACK CARDIAC CATHETERIZATION (CUSTOM PROCEDURE TRAY) ×4 IMPLANT
TRANSDUCER W/STOPCOCK (MISCELLANEOUS) ×4 IMPLANT
TUBING CIL FLEX 10 FLL-RA (TUBING) ×4 IMPLANT

## 2016-01-30 NOTE — Progress Notes (Signed)
SUBJECTIVE: The is extubated.  He is talking on the phone comfortably when I enter the room.  Tearful and requests something to help him sleep.  Denies CP or SOB.  [name] ALPRAZolam  0.25 mg Oral QHS  . aspirin EC  81 mg Oral Daily  . atorvastatin  40 mg Oral Daily  . bisoprolol  2.5 mg Oral Daily  . chlorhexidine gluconate (MEDLINE KIT)  15 mL Mouth Rinse BID  . clopidogrel  75 mg Oral Daily  . digoxin  0.125 mg Oral Daily  . enoxaparin (LOVENOX) injection  40 mg Subcutaneous Q24H  . famotidine (PEPCID) IV  20 mg Intravenous Q12H  . fluorescein  1 strip Both Eyes Once  . mouth rinse  15 mL Mouth Rinse QID  . potassium chloride  40 mEq Oral Daily  . spironolactone  12.5 mg Oral Daily   . amiodarone 30 mg/hr (01/30/16 0622)  . lidocaine 1 mg/min (01/30/16 0622)  . norepinephrine (LEVOPHED) Adult infusion Stopped (01/29/16 2128)  . phenylephrine (NEO-SYNEPHRINE) Adult infusion      OBJECTIVE: Physical Exam: Vitals:   01/30/16 0400 01/30/16 0500 01/30/16 0600 01/30/16 0700  BP: 113/71 107/60 110/69 129/75  Pulse:      Resp: 15 18 (!) 21 15  Temp: 97.7 F (36.5 C)     TempSrc: Oral     SpO2: 96% 97% 95% 96%  Weight:      Height:        Intake/Output Summary (Last 24 hours) at 01/30/16 0745 Last data filed at 01/30/16 0700  Gross per 24 hour  Intake          1416.69 ml  Output             3550 ml  Net         -2133.31 ml    Telemetry reveals intermittent A pacing as well as V pacing generally 80-100bpm.  Only one brief NSVT noted  GEN- The patient is in NAD, ill appearing, alert and oriented x 3 today.   Head- normocephalic, atraumatic Eyes-  Sclera clear, conjunctiva pink Ears- hearing intact Neck- supple, no JVP Lungs- Clear to ausculation bilaterally, normal work of breathing Heart- Regular rate and rhythm, no significant murmurs, no rubs or gallops GI- soft, NT, ND Extremities- no clubbing, cyanosis, or edema Skin- no rash or lesion Psych- anxious, euthymic  mood, full affect Neuro- no gross deficits appreciated  LABS: Basic Metabolic Panel:  Recent Labs  01/29/16 0733 01/29/16 1916 01/30/16 0227  NA 134*  134*  --  134*  K 3.7  3.8 3.7 3.5  CL 101  102  --  99*  CO2 29  27  --  25  GLUCOSE 104*  105*  --  88  BUN 13  12  --  12  CREATININE 1.14  1.14  --  0.93  CALCIUM 8.5*  8.4*  --  8.9  MG 1.8 1.7 2.1  PHOS 2.8  --  3.4   Liver Function Tests:  Recent Labs  01/27/16 1155  01/29/16 0733 01/30/16 0227  AST 37  --   --   --   ALT 36  --   --   --   ALKPHOS 43  --   --   --   BILITOT 1.1  --   --   --   PROT 7.3  --   --   --   ALBUMIN 3.8  < > 2.8* 3.2*  < > =  values in this interval not displayed. No results for input(s): LIPASE, AMYLASE in the last 72 hours. CBC:  Recent Labs  01/29/16 0733 01/30/16 0227  WBC 8.4 8.0  NEUTROABS 5.7 5.5  HGB 11.6* 12.6*  HCT 34.4* 36.6*  MCV 88.9 86.9  PLT 164 172   Cardiac Enzymes:  Recent Labs  01/27/16 1803 01/27/16 2338 01/28/16 0738  TROPONINI 1.89* 2.50* 1.57*     ASSESSMENT AND PLAN:   1. Syncope/VT/VFstorm    >40 shocks Intubated and sedated for patient comfort/safety >> extubated yesterday (01/29/16)    Only a brief NSVT overnight, device interrogated, no VT/VF. Telemetry reviewed with reports of VT last PM.  HR gradually increased from SR/V paced rhythm and believe this is ST with V pacing.    discussed with Dr. Caryl Comes, continue amio gtt for now, stop lidocaine gtt and start mexiletine    Decrease pacing rate to 60bpm    Labs look OK, getting K+ supplement today, mag supplement last PM    Off pressor, BP stable  2. No prior complaints of CP Low suspicion of coronary event     Trop 2.50, felt 2/2 multiple shocks, VF/VT     Not ACS     For cath today  3. ICM  CHF team on board      Fluid negative -1950m  4. ICD site     Will need to address once clinically able     Fluctuation, discoloration, tender, normal WBC,  afebrile  5. R eye pain, E noted some dirt/debris, flushed out in ED     No c/o of this today  6. Anxiety     Will ask CCM to help with this today     Will request psych to see the patient  RJodelle Green12/08/2015 7:45 AM   Reviewed record Telemetry Personally reviewed  No furtyther VT  Continue amio IV overnight And will change lido to mex today  Will need to deal with PTSD  Await cath  Low likelihood but maybe a trigger for VT will be found; we may well be left without explanation and the possibility of LV lead proarrhtyhmia despite the prolonged interval following placement.  Given his underlying conduction  as RBBB we will not resume CRT   Discussed with pt and daughters   Need to address incision lesion prior to discharge

## 2016-01-30 NOTE — Progress Notes (Signed)
Patient complains of anxiety. Cards Fellow paged and notified. Verbal orders to give 0.25 mg tablet of Xanax once.

## 2016-01-30 NOTE — Progress Notes (Signed)
Cards Fellow paged and notified about repeat Mag of 1.7. Verbal orders to give 2g Mag IV once. Potassium orders originally a solution. Verbal orders to change to of K+ one time a day given.

## 2016-01-30 NOTE — H&P (View-Only) (Signed)
Patient ID: [name], male   DOB: Jul 31, 1955, 60 y.o.   MRN: [id number] P  HPI: [name] Seder Swiftis a 60 y.o.malewith PMHx of CAD, ICM with intermittent CHB, RBBB, upgrade to CRT device in October, chronic CHF, active smoker, HTN. He was doing well when last seen in CHF clinic.  He reports that over the weekend he had a couple of episodes of dizziness, attributed tohis nicotine patch, better after taking it off. He was at "the shop" getting ready to go into a shed when he started feeling weak and near syncopal, felt hiself get shocked, and sounds like was in-out feeling himself get shocked about 5 times he thinks, reportedly 5 witnessed by EMS as well.  ICD interrogation in ED showed 41 total shocks. Rhythms included VFlutter that was shocked and degenerated into VF per EP.  LV lead turned off, ?pro-arrhythmia.   With repeated shocks, was intubated and sedated. Placed on amiodarone and lidocaine gtts.   He had VT again yesterday pm, was not shocked.  Short run NSVT overnight.   He is now extubated, no dyspnea or chest pain.  Anxious because of shocks.    Currently appears a-sensed, v-paced.    Scheduled Meds: . ALPRAZolam  0.25 mg Oral QHS  . aspirin EC  81 mg Oral Daily  . atorvastatin  40 mg Oral Daily  . bisoprolol  2.5 mg Oral Daily  . chlorhexidine gluconate (MEDLINE KIT)  15 mL Mouth Rinse BID  . clopidogrel  75 mg Oral Daily  . digoxin  0.125 mg Oral Daily  . enoxaparin (LOVENOX) injection  40 mg Subcutaneous Q24H  . famotidine (PEPCID) IV  20 mg Intravenous Q12H  . fluorescein  1 strip Both Eyes Once  . mouth rinse  15 mL Mouth Rinse QID  . potassium chloride  40 mEq Oral Daily  . spironolactone  12.5 mg Oral Daily   Continuous Infusions: . amiodarone 30 mg/hr (01/30/16 0622)  . lidocaine 1 mg/min (01/30/16 0622)  . norepinephrine (LEVOPHED) Adult infusion Stopped (01/29/16 2128)  . phenylephrine (NEO-SYNEPHRINE) Adult infusion     PRN Meds:.acetaminophen,  albuterol, nitroGLYCERIN, ondansetron (ZOFRAN) IV, polyvinyl alcohol    Vitals:   01/30/16 0400 01/30/16 0500 01/30/16 0600 01/30/16 0700  BP: 113/71 107/60 110/69 129/75  Pulse:      Resp: 15 18 (!) 21 15  Temp: 97.7 F (36.5 C)     TempSrc: Oral     SpO2: 96% 97% 95% 96%  Weight:      Height:        Intake/Output Summary (Last 24 hours) at 01/30/16 0743 Last data filed at 01/30/16 0700  Gross per 24 hour  Intake          1416.69 ml  Output             3550 ml  Net         -2133.31 ml    LABS: Basic Metabolic Panel:  Recent Labs  01/29/16 0733 01/29/16 1916 01/30/16 0227  NA 134*  134*  --  134*  K 3.7  3.8 3.7 3.5  CL 101  102  --  99*  CO2 29  27  --  25  GLUCOSE 104*  105*  --  88  BUN 13  12  --  12  CREATININE 1.14  1.14  --  0.93  CALCIUM 8.5*  8.4*  --  8.9  MG 1.8 1.7 2.1  PHOS 2.8  --  3.4  Liver Function Tests:  Recent Labs  01/27/16 1155  01/29/16 0733 01/30/16 0227  AST 37  --   --   --   ALT 36  --   --   --   ALKPHOS 43  --   --   --   BILITOT 1.1  --   --   --   PROT 7.3  --   --   --   ALBUMIN 3.8  < > 2.8* 3.2*  < > = values in this interval not displayed. No results for input(s): LIPASE, AMYLASE in the last 72 hours. CBC:  Recent Labs  01/29/16 0733 01/30/16 0227  WBC 8.4 8.0  NEUTROABS 5.7 5.5  HGB 11.6* 12.6*  HCT 34.4* 36.6*  MCV 88.9 86.9  PLT 164 172   Cardiac Enzymes:  Recent Labs  01/27/16 1803 01/27/16 2338 01/28/16 0738  TROPONINI 1.89* 2.50* 1.57*   BNP: Invalid input(s): POCBNP D-Dimer: No results for input(s): DDIMER in the last 72 hours. Hemoglobin A1C: No results for input(s): HGBA1C in the last 72 hours. Fasting Lipid Panel: No results for input(s): CHOL, HDL, LDLCALC, TRIG, CHOLHDL, LDLDIRECT in the last 72 hours. Thyroid Function Tests: No results for input(s): TSH, T4TOTAL, T3FREE, THYROIDAB in the last 72 hours.  Invalid input(s): FREET3 Anemia Panel: No results for input(s):  VITAMINB12, FOLATE, FERRITIN, TIBC, IRON, RETICCTPCT in the last 72 hours.  RADIOLOGY: Dg Chest Port 1 View  Result Date: 01/29/2016 CLINICAL DATA:  Pulmonary edema EXAM: PORTABLE CHEST 1 VIEW COMPARISON:  Two days ago FINDINGS: Endotracheal tube tip between the clavicular heads and carina. An orogastric tube reaches the stomach. Biventricular ICD/ pacer in stable position. Cardiomegaly. Coronary stenting. Low volume chest with congested appearance of the vasculature. No Kerley line, air bronchogram effusion, or pneumothorax. IMPRESSION: 1. Stable positioning of endotracheal and orogastric tubes. 2. Stable cardiomegaly and vascular congestion. Electronically Signed   By: Monte Fantasia M.D.   On: 01/29/2016 07:12   Dg Chest Port 1 View  Result Date: 01/27/2016 CLINICAL DATA:  Ventricular flutter and fibrillation with multiple ICD shocks. Intubation. EXAM: PORTABLE CHEST 1 VIEW COMPARISON:  01/27/2016 and prior exams FINDINGS: Cardiomegaly, left-sided ICD, defibrillator pads overlying the chest again noted. Pulmonary vascular congestion is noted with possible mild interstitial edema. An endotracheal tube is noted with tip 4 cm above the carina and NG tube entering the stomach with tip off the field of view now noted. There is no evidence of pneumothorax or pleural effusion. IMPRESSION: Cardiomegaly with pulmonary vascular congestion and possible mild interstitial edema. Support apparatus as described. Electronically Signed   By: Margarette Canada M.D.   On: 01/27/2016 15:42   Dg Chest Port 1 View  Result Date: 01/27/2016 CLINICAL DATA:  Evaluate pacemaker leads.ICD firing. EXAM: PORTABLE CHEST 1 VIEW COMPARISON:  12/16/2015 FINDINGS: Chronic cardiopericardial enlargement. Status post coronary stenting. Biventricular ICD/pacer from the left with leads in stable position. Mild basilar interstitial crowding. There is no edema, consolidation, effusion, or pneumothorax. IMPRESSION: Biventricular ICD/ pacer has a  stable appearance since 12/16/2015. Electronically Signed   By: Monte Fantasia M.D.   On: 01/27/2016 12:10    PHYSICAL EXAM General: NAD, intubated Neck: JVP 8 cm, no thyromegaly or thyroid nodule.  Lungs: Decreased at bases bilaterally CV: Nondisplaced PMI.  Heart regular S1/S2, no S3/S4, no murmur.  No peripheral edema.   Abdomen: Soft, nontender, no hepatosplenomegaly, no distention.  Neurologic: Sedated but awakens  Psych: Normal affect. Extremities: No clubbing or cyanosis.  TELEMETRY: Reviewed telemetry pt in a-sensed, v-paced  ASSESSMENT AND PLAN: 60 yo with history of CAD, ischemic cardiomyopathy admitted with VT storm.  No clear trigger.  Elevated troponin but mild and likely due to multiple shocks.  Prior to VT, he had not noted chest pain or increased dyspnea.  Overall, had been doing well.  1. VT storm: Quiet now on amiodarone and lidocaine gtts.  ?Trigger => K was mildly low but should not have caused.  ?Triggered by BiV pacing.  ?Ischemia but no chest pain and doubt ACS.  Otherwise, electromechanical issue which suggests poor prognosis.  - Continue amiodarone/lidocaine per EP => eventually to mexiletine + amiodarone po.  - LV lead off.  Has underlying wide RBBB. Currently sensing atrium, pacing ventricle.   - Coronary angiography today.  2. Acute on chronic systolic CHF: Ischemic cardiomyopathy.  Echo 10/17 with EF 15%.  Had admission in 10/17 with low output noted in setting of bradycardia/high grade heart block.  He had been much improved with pacing, was NYHA class II-III at last office appt. Now off norepinephrine, BP stable.  - Continue digoxin.  - Can restart spironolactone and low dose bisoprolol. - Hold Lasix this morning given plan for cath, restart probably on po tomorrow.  - Watch BP, can restart Entresto when stable.  3. CAD: No chest pain prior to event, stable cath in 9/17.  Mild troponin elevation likely due to multiple shocks, doubt ACS. However, given VT  storm will plan re-look at coronaries.    - Continue ASA, statin, Plavix.  - Cath today.  Discussed with patient, he understands risks/benefits and agrees to proceed.  4. Rhythm: As above, history of high grade heart block.  LV lead turned off, DDD mode currently.  5. Pulmonary: Extubated.   Loralie Champagne 01/30/2016 7:43 AM

## 2016-01-30 NOTE — Interval H&P Note (Signed)
History and Physical Interval Note:  01/30/2016 3:13 PM  [name]  has presented today for surgery, with the diagnosis of cp  The various methods of treatment have been discussed with the patient and family. After consideration of risks, benefits and other options for treatment, the patient has consented to  Procedure(s): Left Heart Cath and Coronary Angiography (N/A) as a surgical intervention .  The patient's history has been reviewed, patient examined, no change in status, stable for surgery.  I have reviewed the patient's chart and labs.  Questions were answered to the patient's satisfaction.     [name] [employer]

## 2016-01-30 NOTE — Progress Notes (Signed)
Patient ID: [name], male   DOB: [birth year], 60 y.o.   MRN: [id number] P  HPI: [name] Marling Swiftis a 60 y.o.malewith PMHx of CAD, ICM with intermittent CHB, RBBB, upgrade to CRT device in October, chronic CHF, active smoker, HTN. He was doing well when last seen in CHF clinic.  He reports that over the weekend he had a couple of episodes of dizziness, attributed tohis nicotine patch, better after taking it off. He was at "the shop" getting ready to go into a shed when he started feeling weak and near syncopal, felt hiself get shocked, and sounds like was in-out feeling himself get shocked about 5 times he thinks, reportedly 5 witnessed by EMS as well.  ICD interrogation in ED showed 41 total shocks. Rhythms included VFlutter that was shocked and degenerated into VF per EP.  LV lead turned off, ?pro-arrhythmia.   With repeated shocks, was intubated and sedated. Placed on amiodarone and lidocaine gtts.   He had VT again yesterday pm, was not shocked.  Short run NSVT overnight.   He is now extubated, no dyspnea or chest pain.  Anxious because of shocks.    Currently appears a-sensed, v-paced.    Scheduled Meds: . ALPRAZolam  0.25 mg Oral QHS  . aspirin EC  81 mg Oral Daily  . atorvastatin  40 mg Oral Daily  . bisoprolol  2.5 mg Oral Daily  . chlorhexidine gluconate (MEDLINE KIT)  15 mL Mouth Rinse BID  . clopidogrel  75 mg Oral Daily  . digoxin  0.125 mg Oral Daily  . enoxaparin (LOVENOX) injection  40 mg Subcutaneous Q24H  . famotidine (PEPCID) IV  20 mg Intravenous Q12H  . fluorescein  1 strip Both Eyes Once  . mouth rinse  15 mL Mouth Rinse QID  . potassium chloride  40 mEq Oral Daily  . spironolactone  12.5 mg Oral Daily   Continuous Infusions: . amiodarone 30 mg/hr (01/30/16 0622)  . lidocaine 1 mg/min (01/30/16 0622)  . norepinephrine (LEVOPHED) Adult infusion Stopped (01/29/16 2128)  . phenylephrine (NEO-SYNEPHRINE) Adult infusion     PRN Meds:.acetaminophen,  albuterol, nitroGLYCERIN, ondansetron (ZOFRAN) IV, polyvinyl alcohol    Vitals:   01/30/16 0400 01/30/16 0500 01/30/16 0600 01/30/16 0700  BP: 113/71 107/60 110/69 129/75  Pulse:      Resp: 15 18 (!) 21 15  Temp: 97.7 F (36.5 C)     TempSrc: Oral     SpO2: 96% 97% 95% 96%  Weight:      Height:        Intake/Output Summary (Last 24 hours) at 01/30/16 0743 Last data filed at 01/30/16 0700  Gross per 24 hour  Intake          1416.69 ml  Output             3550 ml  Net         -2133.31 ml    LABS: Basic Metabolic Panel:  Recent Labs  01/29/16 0733 01/29/16 1916 01/30/16 0227  NA 134*  134*  --  134*  K 3.7  3.8 3.7 3.5  CL 101  102  --  99*  CO2 29  27  --  25  GLUCOSE 104*  105*  --  88  BUN 13  12  --  12  CREATININE 1.14  1.14  --  0.93  CALCIUM 8.5*  8.4*  --  8.9  MG 1.8 1.7 2.1  PHOS 2.8  --  3.4  Liver Function Tests:  Recent Labs  01/27/16 1155  01/29/16 0733 01/30/16 0227  AST 37  --   --   --   ALT 36  --   --   --   ALKPHOS 43  --   --   --   BILITOT 1.1  --   --   --   PROT 7.3  --   --   --   ALBUMIN 3.8  < > 2.8* 3.2*  < > = values in this interval not displayed. No results for input(s): LIPASE, AMYLASE in the last 72 hours. CBC:  Recent Labs  01/29/16 0733 01/30/16 0227  WBC 8.4 8.0  NEUTROABS 5.7 5.5  HGB 11.6* 12.6*  HCT 34.4* 36.6*  MCV 88.9 86.9  PLT 164 172   Cardiac Enzymes:  Recent Labs  01/27/16 1803 01/27/16 2338 01/28/16 0738  TROPONINI 1.89* 2.50* 1.57*   BNP: Invalid input(s): POCBNP D-Dimer: No results for input(s): DDIMER in the last 72 hours. Hemoglobin A1C: No results for input(s): HGBA1C in the last 72 hours. Fasting Lipid Panel: No results for input(s): CHOL, HDL, LDLCALC, TRIG, CHOLHDL, LDLDIRECT in the last 72 hours. Thyroid Function Tests: No results for input(s): TSH, T4TOTAL, T3FREE, THYROIDAB in the last 72 hours.  Invalid input(s): FREET3 Anemia Panel: No results for input(s):  VITAMINB12, FOLATE, FERRITIN, TIBC, IRON, RETICCTPCT in the last 72 hours.  RADIOLOGY: Dg Chest Port 1 View  Result Date: 01/29/2016 CLINICAL DATA:  Pulmonary edema EXAM: PORTABLE CHEST 1 VIEW COMPARISON:  Two days ago FINDINGS: Endotracheal tube tip between the clavicular heads and carina. An orogastric tube reaches the stomach. Biventricular ICD/ pacer in stable position. Cardiomegaly. Coronary stenting. Low volume chest with congested appearance of the vasculature. No Kerley line, air bronchogram effusion, or pneumothorax. IMPRESSION: 1. Stable positioning of endotracheal and orogastric tubes. 2. Stable cardiomegaly and vascular congestion. Electronically Signed   By: Monte Fantasia M.D.   On: 01/29/2016 07:12   Dg Chest Port 1 View  Result Date: 01/27/2016 CLINICAL DATA:  Ventricular flutter and fibrillation with multiple ICD shocks. Intubation. EXAM: PORTABLE CHEST 1 VIEW COMPARISON:  01/27/2016 and prior exams FINDINGS: Cardiomegaly, left-sided ICD, defibrillator pads overlying the chest again noted. Pulmonary vascular congestion is noted with possible mild interstitial edema. An endotracheal tube is noted with tip 4 cm above the carina and NG tube entering the stomach with tip off the field of view now noted. There is no evidence of pneumothorax or pleural effusion. IMPRESSION: Cardiomegaly with pulmonary vascular congestion and possible mild interstitial edema. Support apparatus as described. Electronically Signed   By: Margarette Canada M.D.   On: 01/27/2016 15:42   Dg Chest Port 1 View  Result Date: 01/27/2016 CLINICAL DATA:  Evaluate pacemaker leads.ICD firing. EXAM: PORTABLE CHEST 1 VIEW COMPARISON:  12/16/2015 FINDINGS: Chronic cardiopericardial enlargement. Status post coronary stenting. Biventricular ICD/pacer from the left with leads in stable position. Mild basilar interstitial crowding. There is no edema, consolidation, effusion, or pneumothorax. IMPRESSION: Biventricular ICD/ pacer has a  stable appearance since 12/16/2015. Electronically Signed   By: Monte Fantasia M.D.   On: 01/27/2016 12:10    PHYSICAL EXAM General: NAD, intubated Neck: JVP 8 cm, no thyromegaly or thyroid nodule.  Lungs: Decreased at bases bilaterally CV: Nondisplaced PMI.  Heart regular S1/S2, no S3/S4, no murmur.  No peripheral edema.   Abdomen: Soft, nontender, no hepatosplenomegaly, no distention.  Neurologic: Sedated but awakens  Psych: Normal affect. Extremities: No clubbing or cyanosis.  TELEMETRY: Reviewed telemetry pt in a-sensed, v-paced  ASSESSMENT AND PLAN: 61 yo with history of CAD, ischemic cardiomyopathy admitted with VT storm.  No clear trigger.  Elevated troponin but mild and likely due to multiple shocks.  Prior to VT, he had not noted chest pain or increased dyspnea.  Overall, had been doing well.  1. VT storm: Quiet now on amiodarone and lidocaine gtts.  ?Trigger => K was mildly low but should not have caused.  ?Triggered by BiV pacing.  ?Ischemia but no chest pain and doubt ACS.  Otherwise, electromechanical issue which suggests poor prognosis.  - Continue amiodarone/lidocaine per EP => eventually to mexiletine + amiodarone po.  - LV lead off.  Has underlying wide RBBB. Currently sensing atrium, pacing ventricle.   - Coronary angiography today.  2. Acute on chronic systolic CHF: Ischemic cardiomyopathy.  Echo 10/17 with EF 15%.  Had admission in 10/17 with low output noted in setting of bradycardia/high grade heart block.  He had been much improved with pacing, was NYHA class II-III at last office appt. Now off norepinephrine, BP stable.  - Continue digoxin.  - Can restart spironolactone and low dose bisoprolol. - Hold Lasix this morning given plan for cath, restart probably on po tomorrow.  - Watch BP, can restart Entresto when stable.  3. CAD: No chest pain prior to event, stable cath in 9/17.  Mild troponin elevation likely due to multiple shocks, doubt ACS. However, given VT  storm will plan re-look at coronaries.    - Continue ASA, statin, Plavix.  - Cath today.  Discussed with patient, he understands risks/benefits and agrees to proceed.  4. Rhythm: As above, history of high grade heart block.  LV lead turned off, DDD mode currently.  5. Pulmonary: Extubated.   Loralie Champagne 01/30/2016 7:43 AM

## 2016-01-31 ENCOUNTER — Encounter (HOSPITAL_COMMUNITY): Payer: Self-pay

## 2016-01-31 ENCOUNTER — Other Ambulatory Visit: Payer: Self-pay | Admitting: Internal Medicine

## 2016-01-31 LAB — MAGNESIUM: Magnesium: 1.8 mg/dL (ref 1.7–2.4)

## 2016-01-31 LAB — CBC WITH DIFFERENTIAL/PLATELET
Basophils Absolute: 0 10*3/uL (ref 0.0–0.1)
Basophils Relative: 0 %
EOS PCT: 5 %
Eosinophils Absolute: 0.3 10*3/uL (ref 0.0–0.7)
HCT: 33.1 % — ABNORMAL LOW (ref 39.0–52.0)
Hemoglobin: 11.4 g/dL — ABNORMAL LOW (ref 13.0–17.0)
LYMPHS ABS: 1.4 10*3/uL (ref 0.7–4.0)
LYMPHS PCT: 22 %
MCH: 30.2 pg (ref 26.0–34.0)
MCHC: 34.4 g/dL (ref 30.0–36.0)
MCV: 87.6 fL (ref 78.0–100.0)
MONO ABS: 0.5 10*3/uL (ref 0.1–1.0)
Monocytes Relative: 8 %
Neutro Abs: 4.1 10*3/uL (ref 1.7–7.7)
Neutrophils Relative %: 65 %
PLATELETS: 176 10*3/uL (ref 150–400)
RBC: 3.78 MIL/uL — AB (ref 4.22–5.81)
RDW: 15 % (ref 11.5–15.5)
WBC: 6.2 10*3/uL (ref 4.0–10.5)

## 2016-01-31 LAB — RENAL FUNCTION PANEL
Albumin: 2.9 g/dL — ABNORMAL LOW (ref 3.5–5.0)
Anion gap: 8 (ref 5–15)
BUN: 7 mg/dL (ref 6–20)
CHLORIDE: 104 mmol/L (ref 101–111)
CO2: 25 mmol/L (ref 22–32)
Calcium: 8.4 mg/dL — ABNORMAL LOW (ref 8.9–10.3)
Creatinine, Ser: 0.93 mg/dL (ref 0.61–1.24)
GFR calc Af Amer: >60 mL/min (ref >60)
GFR calc non Af Amer: >60 mL/min (ref >60)
GLUCOSE: 95 mg/dL (ref 65–99)
POTASSIUM: 4 mmol/L (ref 3.5–5.1)
Phosphorus: 2.8 mg/dL (ref 2.5–4.6)
Sodium: 137 mmol/L (ref 135–145)

## 2016-01-31 LAB — BASIC METABOLIC PANEL
ANION GAP: 8 (ref 5–15)
BUN: 8 mg/dL (ref 6–20)
CALCIUM: 8.4 mg/dL — AB (ref 8.9–10.3)
CO2: 25 mmol/L (ref 22–32)
Chloride: 104 mmol/L (ref 101–111)
Creatinine, Ser: 0.98 mg/dL (ref 0.61–1.24)
GFR calc Af Amer: >60 mL/min (ref >60)
GFR calc non Af Amer: >60 mL/min (ref >60)
GLUCOSE: 95 mg/dL (ref 65–99)
Potassium: 4 mmol/L (ref 3.5–5.1)
Sodium: 137 mmol/L (ref 135–145)

## 2016-01-31 MED ORDER — PAROXETINE HCL 10 MG PO TABS
10.0000 mg | ORAL_TABLET | Freq: Every day | ORAL | Status: DC
  Administered 2016-01-31 – 2016-02-02 (×3): 10 mg via ORAL
  Filled 2016-01-31 (×3): qty 1

## 2016-01-31 MED ORDER — AMIODARONE HCL 200 MG PO TABS
400.0000 mg | ORAL_TABLET | Freq: Two times a day (BID) | ORAL | Status: DC
  Administered 2016-01-31 – 2016-02-02 (×5): 400 mg via ORAL
  Filled 2016-01-31 (×5): qty 2

## 2016-01-31 MED ORDER — MAGNESIUM SULFATE 2 GM/50ML IV SOLN
2.0000 g | Freq: Once | INTRAVENOUS | Status: AC
  Administered 2016-01-31: 2 g via INTRAVENOUS
  Filled 2016-01-31: qty 50

## 2016-01-31 MED ORDER — FUROSEMIDE 40 MG PO TABS
40.0000 mg | ORAL_TABLET | Freq: Every day | ORAL | Status: DC
  Administered 2016-01-31 – 2016-02-02 (×3): 40 mg via ORAL
  Filled 2016-01-31 (×3): qty 1

## 2016-01-31 MED ORDER — LOSARTAN POTASSIUM 25 MG PO TABS
12.5000 mg | ORAL_TABLET | Freq: Two times a day (BID) | ORAL | Status: DC
  Administered 2016-01-31 – 2016-02-02 (×5): 12.5 mg via ORAL
  Filled 2016-01-31 (×6): qty 1

## 2016-01-31 MED ORDER — MAGNESIUM OXIDE 400 (241.3 MG) MG PO TABS
400.0000 mg | ORAL_TABLET | Freq: Every day | ORAL | Status: DC
  Administered 2016-01-31 – 2016-02-02 (×3): 400 mg via ORAL
  Filled 2016-01-31 (×3): qty 1

## 2016-01-31 MED ORDER — LOPERAMIDE HCL 2 MG PO CAPS
4.0000 mg | ORAL_CAPSULE | ORAL | Status: DC | PRN
  Administered 2016-01-31 (×2): 4 mg via ORAL
  Filled 2016-01-31 (×2): qty 2

## 2016-01-31 MED ORDER — FAMOTIDINE 20 MG PO TABS
20.0000 mg | ORAL_TABLET | Freq: Two times a day (BID) | ORAL | Status: DC
  Administered 2016-01-31 – 2016-02-02 (×4): 20 mg via ORAL
  Filled 2016-01-31 (×4): qty 1

## 2016-01-31 NOTE — Progress Notes (Signed)
 [facility]2016 [facility] records. All available records (84 pages) from office/providers for 2017 mailed to provided address: [id number] [address] [place] [id number] Copy of request scanned into patient's electronic medical record. Case # [id number]  Ave FilterBradley, Megan Genevea, RN

## 2016-01-31 NOTE — Progress Notes (Signed)
SUBJECTIVE:  .  Denies CP or SOB.  [name]. ALPRAZolam  0.25 mg Oral QHS  . aspirin EC  81 mg Oral Daily  . atorvastatin  40 mg Oral Daily  . bisoprolol  2.5 mg Oral Daily  . clopidogrel  75 mg Oral Daily  . digoxin  0.125 mg Oral Daily  . famotidine (PEPCID) IV  20 mg Intravenous Q12H  . fluorescein  1 strip Both Eyes Once  . heparin  5,000 Units Subcutaneous Q8H  . mexiletine  200 mg Oral Q12H  . potassium chloride  40 mEq Oral Daily  . sodium chloride flush  3 mL Intravenous Q12H  . spironolactone  12.5 mg Oral Daily   . amiodarone 30 mg/hr (01/31/16 16100632)  . norepinephrine (LEVOPHED) Adult infusion Stopped (01/29/16 2128)  . phenylephrine (NEO-SYNEPHRINE) Adult infusion      OBJECTIVE: Physical Exam: Vitals:   01/31/16 0300 01/31/16 0400 01/31/16 0500 01/31/16 0600  BP: (!) 87/51 (!) 91/52 (!) 98/58 108/83  Pulse:      Resp: 15 17 17  (!) 23  Temp:      TempSrc:      SpO2: 96% 95% 95% 96%  Weight:      Height:        Intake/Output Summary (Last 24 hours) at 01/31/16 0709 Last data filed at 01/31/16 [id number]  Gross per 24 hour  Intake          1963.35 ml  Output             2700 ml  Net          -736.65 ml    Telemetry Personally reviewed  No VT rare couplet  GEN- The patient is in NAD, ill appearing, alert and oriented x 3 today.   Head- normocephalic, atraumatic Eyes-  Sclera clear, conjunctiva pink Ears- hearing intact Neck- supple, no JVP Lungs- Clear to ausculation bilaterally, normal work of breathing Heart- Regular rate and rhythm, no significant murmurs, no rubs or gallops GI- soft, NT, ND Extremities- no clubbing, cyanosis, or edema Skin- no rash or lesion Psych- anxious, euthymic mood, full affect Neuro- no gross deficits appreciated  LABS: Basic Metabolic Panel:  Recent Labs  54/10/8110/07/17 0227 01/30/16 1700 01/31/16 0232  NA 134*  --  137  K 3.5  --  4.0  CL 99*  --  104  CO2 25  --  25  GLUCOSE 88  --  95  BUN 12  --  7  CREATININE 0.93  1.07 0.93  CALCIUM 8.9  --  8.4*  MG 2.1  --  1.8  PHOS 3.4  --  2.8   Liver Function Tests:  Recent Labs  01/30/16 0227 01/31/16 0232  ALBUMIN 3.2* 2.9*   No results for input(s): LIPASE, AMYLASE in the last 72 hours. CBC:  Recent Labs  01/30/16 0227 01/30/16 1700 01/31/16 0232  WBC 8.0 6.9 6.2  NEUTROABS 5.5  --  4.1  HGB 12.6* 11.5* 11.4*  HCT 36.6* 33.8* 33.1*  MCV 86.9 87.1 87.6  PLT 172 169 176   Cardiac Enzymes:  Recent Labs  01/28/16 0738  TROPONINI 1.57*     ASSESSMENT AND PLAN:   1. Syncope/VT/VFstorm    >40 shocks    2. ICM  CHF team on board      Fluid negative -1902ml  3  ICD site     Will need to address once clinically able     Fluctuation, discoloration, tender, normal WBC, afebrile  4 Anxiety     Change amio to po 400 bid x 10 more days then 400 qd  Ambulate   antifipate home Sunday or Monday-- partly realted to the asusrance that he will need to know his VT is quiet  Begin paxil for PTSD  Back on heart failure

## 2016-01-31 NOTE — Progress Notes (Addendum)
Patient ID: [name], male   DOB: 01/26/1956, 60 y.o.   MRN: [id number]   HPI: [name] a 60 y.o.malewith PMHx of CAD, ICM with intermittent CHB, RBBB, upgrade to CRT device in October, chronic CHF, active smoker, HTN. He was doing well when last seen in CHF clinic.  He reports that over the weekend he had a couple of episodes of dizziness, attributed tohis nicotine patch, better after taking it off. He was at "the shop" getting ready to go into a shed when he started feeling weak and near syncopal, felt hiself get shocked, and sounds like was in-out feeling himself get shocked about 5 times he thinks, reportedly 5 witnessed by EMS as well.  ICD interrogation in ED showed 41 total shocks. Rhythms included VFlutter that was shocked and degenerated into VF per EP.  LV lead turned off, ?pro-arrhythmia.   With repeated shocks, was intubated and sedated. Placed on amiodarone and lidocaine gtts.   No further VT.    LHC (12/7) showed no change from prior angiography in 9/17.   He is now extubated, no dyspnea or chest pain. Generally fatigued and has not been out of bed.  Anxious because of shocks.    Currently NSR, no pacing.    Scheduled Meds: . ALPRAZolam  0.25 mg Oral QHS  . amiodarone  400 mg Oral BID  . aspirin EC  81 mg Oral Daily  . atorvastatin  40 mg Oral Daily  . bisoprolol  2.5 mg Oral Daily  . clopidogrel  75 mg Oral Daily  . digoxin  0.125 mg Oral Daily  . famotidine (PEPCID) IV  20 mg Intravenous Q12H  . fluorescein  1 strip Both Eyes Once  . furosemide  40 mg Oral Daily  . heparin  5,000 Units Subcutaneous Q8H  . losartan  12.5 mg Oral BID  . magnesium oxide  400 mg Oral Daily  . magnesium sulfate 1 - 4 g bolus IVPB  2 g Intravenous Once  . mexiletine  200 mg Oral Q12H  . PARoxetine  10 mg Oral Daily  . potassium chloride  40 mEq Oral Daily  . sodium chloride flush  3 mL Intravenous Q12H  . spironolactone  12.5 mg Oral Daily   Continuous  Infusions: . norepinephrine (LEVOPHED) Adult infusion Stopped (01/29/16 2128)  . phenylephrine (NEO-SYNEPHRINE) Adult infusion     PRN Meds:.sodium chloride, acetaminophen, albuterol, ALPRAZolam, nitroGLYCERIN, ondansetron (ZOFRAN) IV, polyvinyl alcohol, sodium chloride flush    Vitals:   01/31/16 0400 01/31/16 0500 01/31/16 0600 01/31/16 0700  BP: (!) 91/52 (!) 98/58 108/83 (!) 114/57  Pulse:      Resp: 17 17 (!) 23 13  Temp: 98.5 F (36.9 C)     TempSrc: Oral     SpO2: 95% 95% 96% 99%  Weight:      Height:        Intake/Output Summary (Last 24 hours) at 01/31/16 0752 Last data filed at 01/31/16 0700  Gross per 24 hour  Intake          1990.05 ml  Output             2700 ml  Net          -709.95 ml    LABS: Basic Metabolic Panel:  Recent Labs  40/98/1110/09/09 0227 01/30/16 1700 01/31/16 0232  NA 134*  --  137  K 3.5  --  4.0  CL 99*  --  104  CO2 25  --  25  GLUCOSE 88  --  95  BUN 12  --  7  CREATININE 0.93 1.07 0.93  CALCIUM 8.9  --  8.4*  MG 2.1  --  1.8  PHOS 3.4  --  2.8   Liver Function Tests:  Recent Labs  01/30/16 0227 01/31/16 0232  ALBUMIN 3.2* 2.9*   No results for input(s): LIPASE, AMYLASE in the last 72 hours. CBC:  Recent Labs  01/30/16 0227 01/30/16 1700 01/31/16 0232  WBC 8.0 6.9 6.2  NEUTROABS 5.5  --  4.1  HGB 12.6* 11.5* 11.4*  HCT 36.6* 33.8* 33.1*  MCV 86.9 87.1 87.6  PLT 172 169 176   Cardiac Enzymes: No results for input(s): CKTOTAL, CKMB, CKMBINDEX, TROPONINI in the last 72 hours. BNP: Invalid input(s): POCBNP D-Dimer: No results for input(s): DDIMER in the last 72 hours. Hemoglobin A1C: No results for input(s): HGBA1C in the last 72 hours. Fasting Lipid Panel: No results for input(s): CHOL, HDL, LDLCALC, TRIG, CHOLHDL, LDLDIRECT in the last 72 hours. Thyroid Function Tests: No results for input(s): TSH, T4TOTAL, T3FREE, THYROIDAB in the last 72 hours.  Invalid input(s): FREET3 Anemia Panel: No results for  input(s): VITAMINB12, FOLATE, FERRITIN, TIBC, IRON, RETICCTPCT in the last 72 hours.  RADIOLOGY: Dg Chest Port 1 View  Result Date: 01/29/2016 CLINICAL DATA:  Pulmonary edema EXAM: PORTABLE CHEST 1 VIEW COMPARISON:  Two days ago FINDINGS: Endotracheal tube tip between the clavicular heads and carina. An orogastric tube reaches the stomach. Biventricular ICD/ pacer in stable position. Cardiomegaly. Coronary stenting. Low volume chest with congested appearance of the vasculature. No Kerley line, air bronchogram effusion, or pneumothorax. IMPRESSION: 1. Stable positioning of endotracheal and orogastric tubes. 2. Stable cardiomegaly and vascular congestion. Electronically Signed   By: Marnee Spring M.D.   On: 01/29/2016 07:12   Dg Chest Port 1 View  Result Date: 01/27/2016 CLINICAL DATA:  Ventricular flutter and fibrillation with multiple ICD shocks. Intubation. EXAM: PORTABLE CHEST 1 VIEW COMPARISON:  01/27/2016 and prior exams FINDINGS: Cardiomegaly, left-sided ICD, defibrillator pads overlying the chest again noted. Pulmonary vascular congestion is noted with possible mild interstitial edema. An endotracheal tube is noted with tip 4 cm above the carina and NG tube entering the stomach with tip off the field of view now noted. There is no evidence of pneumothorax or pleural effusion. IMPRESSION: Cardiomegaly with pulmonary vascular congestion and possible mild interstitial edema. Support apparatus as described. Electronically Signed   By: Harmon Pier M.D.   On: 01/27/2016 15:42   Dg Chest Port 1 View  Result Date: 01/27/2016 CLINICAL DATA:  Evaluate pacemaker leads.ICD firing. EXAM: PORTABLE CHEST 1 VIEW COMPARISON:  12/16/2015 FINDINGS: Chronic cardiopericardial enlargement. Status post coronary stenting. Biventricular ICD/pacer from the left with leads in stable position. Mild basilar interstitial crowding. There is no edema, consolidation, effusion, or pneumothorax. IMPRESSION: Biventricular ICD/  pacer has a stable appearance since 12/16/2015. Electronically Signed   By: Marnee Spring M.D.   On: 01/27/2016 12:10    PHYSICAL EXAM General: NAD, intubated Neck: JVP 8 cm, no thyromegaly or thyroid nodule.  Lungs: Decreased at bases bilaterally CV: Nondisplaced PMI.  Heart regular S1/S2, no S3/S4, no murmur.  No peripheral edema.   Abdomen: Soft, nontender, no hepatosplenomegaly, no distention.  Neurologic: Sedated but awakens  Psych: flat affect. Extremities: No clubbing or cyanosis.   TELEMETRY: Reviewed telemetry pt in a-sensed, v-paced  ASSESSMENT AND PLAN: 60 yo with history of CAD, ischemic cardiomyopathy admitted with VT storm.  No  clear trigger.  Elevated troponin but mild and likely due to multiple shocks.  Prior to VT, he had not noted chest pain or increased dyspnea.  Overall, had been doing well.  1. VT storm: Quiet now on amiodarone and lidocaine gtts.  ?Trigger => K was mildly low but should not have caused.  ?Triggered by BiV pacing.  No ACS, coronary angiography showed no change from 9/17 cath.  May be electromechanical issue which suggests poor prognosis.  - Transition to po amiodarone today, already on mexiletine.  - LV lead off given concern for pro-arrhythmia.  Has underlying wide RBBB. Currently not pacing.    - No driving x 6 months.  2. Acute on chronic systolic CHF: Ischemic cardiomyopathy.  Echo 10/17 with EF 15%.  Had admission in 10/17 with low output noted in setting of bradycardia/high grade heart block.  He had been much improved with pacing, was NYHA class II-III at last office appt. Now off norepinephrine, BP stable but some what soft.  - Continue digoxin.  - Continue spironolactone and low dose bisoprolol. - Restart his home Lasix, 40 mg daily.  - BP not adequate yet to go back on home Entresto.  Would use losartan 12.5 mg bid and follow BP => if BP starts to run higher, [place] back [place].  3. CAD: No chest pain prior to event, stable cath in  9/17.  Mild troponin elevation likely due to multiple shocks, doubt ACS. Given VT storm, did re-look cath on 12/7.  This showed stable coronaries compared to 9/17 cath.    - Continue ASA, statin, Plavix.  4. Rhythm: As above, history of high grade heart block.  LV lead turned off, currently not pacing.   6. Psych: Suspect he will have significant PTSD. Paxil started.   Marca AnconaDalton Ilijah Doucet 01/31/2016 7:52 AM

## 2016-02-01 DIAGNOSIS — I255 Ischemic cardiomyopathy: Secondary | ICD-10-CM

## 2016-02-01 DIAGNOSIS — I472 Ventricular tachycardia: Principal | ICD-10-CM

## 2016-02-01 LAB — CBC WITH DIFFERENTIAL/PLATELET
Basophils Absolute: 0 10*3/uL (ref 0.0–0.1)
Basophils Relative: 0 %
EOS PCT: 6 %
Eosinophils Absolute: 0.4 10*3/uL (ref 0.0–0.7)
HCT: 32.3 % — ABNORMAL LOW (ref 39.0–52.0)
HEMOGLOBIN: 10.9 g/dL — AB (ref 13.0–17.0)
LYMPHS ABS: 1.7 10*3/uL (ref 0.7–4.0)
LYMPHS PCT: 22 %
MCH: 29.9 pg (ref 26.0–34.0)
MCHC: 33.7 g/dL (ref 30.0–36.0)
MCV: 88.5 fL (ref 78.0–100.0)
MONOS PCT: 7 %
Monocytes Absolute: 0.6 10*3/uL (ref 0.1–1.0)
Neutro Abs: 5.2 10*3/uL (ref 1.7–7.7)
Neutrophils Relative %: 65 %
PLATELETS: 188 10*3/uL (ref 150–400)
RBC: 3.65 MIL/uL — AB (ref 4.22–5.81)
RDW: 15.2 % (ref 11.5–15.5)
WBC: 7.9 10*3/uL (ref 4.0–10.5)

## 2016-02-01 LAB — RENAL FUNCTION PANEL
Albumin: 2.8 g/dL — ABNORMAL LOW (ref 3.5–5.0)
Anion gap: 7 (ref 5–15)
BUN: 9 mg/dL (ref 6–20)
CO2: 26 mmol/L (ref 22–32)
Calcium: 8.4 mg/dL — ABNORMAL LOW (ref 8.9–10.3)
Chloride: 104 mmol/L (ref 101–111)
Creatinine, Ser: 1.07 mg/dL (ref 0.61–1.24)
GFR calc Af Amer: >60 mL/min (ref >60)
Glucose, Bld: 100 mg/dL — ABNORMAL HIGH (ref 65–99)
POTASSIUM: 4 mmol/L (ref 3.5–5.1)
Phosphorus: 3.2 mg/dL (ref 2.5–4.6)
Sodium: 137 mmol/L (ref 135–145)

## 2016-02-01 LAB — MAGNESIUM: Magnesium: 1.9 mg/dL (ref 1.7–2.4)

## 2016-02-01 NOTE — Progress Notes (Signed)
Pt has arrived to 2w from 4N. Telemetry box applied and CCMD notified. Pt oriented to room. Pt given a xanax for anxiety, per pt request. Will continue current plan of care.   Berdine DanceLauren Moffitt BSN, RN

## 2016-02-01 NOTE — Progress Notes (Signed)
CARDIAC REHAB PHASE I   PRE:  Rate/Rhythm: 63 SR  BP:  Supine: 113/49  Sitting:   Standing:    SaO2: 99%RA  MODE:  Ambulation: 470 ft   POST:  Rate/Rhythm: 81 SR  BP:  Supine:   Sitting: 134/55  Standing:    SaO2: 100%RA 1100-1145 Pt walked 470 ft on RA with steady gait. Denied SOB. Tired at the end of walk. To recliner. Gave pt CHF booklet and reviewed when to call MD. Pt knows to weigh daily and discussed  when to call MD. Discussed 2000 mg sodium restriction and gave low sodium diets. Re enforced 2L FR. Discussed CRP 2 but pt not interested. Gave pt fake cigarette and smoking cessation handout. Pt not sure if he will be able to quit completely. Has cut down. Encouraged to call [phone] if needed.   Luetta Nuttingharlene Zakai Gonyea, RN BSN  02/01/2016 11:39 AM

## 2016-02-01 NOTE — Progress Notes (Signed)
SUBJECTIVE: The patient is doing well today.  At this time, he denies chest pain, shortness of breath, or any new concerns.  No further VT.  [name]. ALPRAZolam  0.25 mg Oral QHS  . amiodarone  400 mg Oral BID  . aspirin EC  81 mg Oral Daily  . atorvastatin  40 mg Oral Daily  . bisoprolol  2.5 mg Oral Daily  . clopidogrel  75 mg Oral Daily  . digoxin  0.125 mg Oral Daily  . famotidine  20 mg Oral BID  . fluorescein  1 strip Both Eyes Once  . furosemide  40 mg Oral Daily  . heparin  5,000 Units Subcutaneous Q8H  . losartan  12.5 mg Oral BID  . magnesium oxide  400 mg Oral Daily  . mexiletine  200 mg Oral Q12H  . PARoxetine  10 mg Oral Daily  . potassium chloride  40 mEq Oral Daily  . sodium chloride flush  3 mL Intravenous Q12H  . spironolactone  12.5 mg Oral Daily   . norepinephrine (LEVOPHED) Adult infusion Stopped (01/29/16 2128)  . phenylephrine (NEO-SYNEPHRINE) Adult infusion      OBJECTIVE: Physical Exam: Vitals:   02/01/16 0500 02/01/16 0600 02/01/16 0811 02/01/16 0900  BP: (!) 112/41 (!) 108/47 (!) 114/44 (!) 133/47  Pulse:   61   Resp: 13 16 16 13   Temp:   97.6 F (36.4 C)   TempSrc:   Oral   SpO2: 97% 93% 99% 95%  Weight:      Height:        Intake/Output Summary (Last 24 hours) at 02/01/16 1001 Last data filed at 02/01/16 0000  Gross per 24 hour  Intake              610 ml  Output              650 ml  Net              -40 ml    Telemetry reveals sinus rhythm, no further VT.  GEN- The patient is well appearing, alert and oriented x 3 today.   Head- normocephalic, atraumatic Eyes-  Sclera clear, conjunctiva pink Ears- hearing intact Oropharynx- clear Neck- supple,  Lungs- Clear to ausculation bilaterally, normal work of breathing Heart- Regular rate and rhythm  GI- soft, NT, ND, + BS Extremities- no clubbing, cyanosis, or edema Skin- there is a blister over the medial edge of his device pocket which is very thin but large. Neuro- strength and  sensation are intact  LABS: Basic Metabolic Panel:  Recent Labs  09/81/1910/10/10 0232 02/01/16 0201  NA 137  137 137  K 4.0  4.0 4.0  CL 104  104 104  CO2 25  25 26   GLUCOSE 95  95 100*  BUN 8  7 9   CREATININE 0.98  0.93 1.07  CALCIUM 8.4*  8.4* 8.4*  MG 1.8 1.9  PHOS 2.8 3.2   Liver Function Tests:  Recent Labs  01/31/16 0232 02/01/16 0201  ALBUMIN 2.9* 2.8*   No results for input(s): LIPASE, AMYLASE in the last 72 hours. CBC:  Recent Labs  01/31/16 0232 02/01/16 0201  WBC 6.2 7.9  NEUTROABS 4.1 5.2  HGB 11.4* 10.9*  HCT 33.1* 32.3*  MCV 87.6 88.5  PLT 176 188   RADIOLOGY: Dg Chest Port 1 View  Result Date: 01/29/2016 CLINICAL DATA:  Pulmonary edema EXAM: PORTABLE CHEST 1 VIEW COMPARISON:  Two days ago FINDINGS: Endotracheal tube tip between the  clavicular heads and carina. An orogastric tube reaches the stomach. Biventricular ICD/ pacer in stable position. Cardiomegaly. Coronary stenting. Low volume chest with congested appearance of the vasculature. No Kerley line, air bronchogram effusion, or pneumothorax. IMPRESSION: 1. Stable positioning of endotracheal and orogastric tubes. 2. Stable cardiomegaly and vascular congestion. Electronically Signed   By: Marnee SpringJonathon  Watts M.D.   On: 01/29/2016 07:12   Dg Chest Port 1 View  Result Date: 01/27/2016 CLINICAL DATA:  Ventricular flutter and fibrillation with multiple ICD shocks. Intubation. EXAM: PORTABLE CHEST 1 VIEW COMPARISON:  01/27/2016 and prior exams FINDINGS: Cardiomegaly, left-sided ICD, defibrillator pads overlying the chest again noted. Pulmonary vascular congestion is noted with possible mild interstitial edema. An endotracheal tube is noted with tip 4 cm above the carina and NG tube entering the stomach with tip off the field of view now noted. There is no evidence of pneumothorax or pleural effusion. IMPRESSION: Cardiomegaly with pulmonary vascular congestion and possible mild interstitial edema. Support  apparatus as described. Electronically Signed   By: Harmon PierJeffrey  Hu M.D.   On: 01/27/2016 15:42   Dg Chest Port 1 View  Result Date: 01/27/2016 CLINICAL DATA:  Evaluate pacemaker leads.ICD firing. EXAM: PORTABLE CHEST 1 VIEW COMPARISON:  12/16/2015 FINDINGS: Chronic cardiopericardial enlargement. Status post coronary stenting. Biventricular ICD/pacer from the left with leads in stable position. Mild basilar interstitial crowding. There is no edema, consolidation, effusion, or pneumothorax. IMPRESSION: Biventricular ICD/ pacer has a stable appearance since 12/16/2015. Electronically Signed   By: Marnee SpringJonathon  Watts M.D.   On: 01/27/2016 12:10    ASSESSMENT AND PLAN:   1. VT Admitted with VT storm No quiescent with amiodarone Continue current oral amiodarone dosing Titrate beta blocker as able No device programming changes required today  2. ICD pocket concerns There is a large and thin blister over the medial edge of the device pocket.  I suspect that this is a threatened erosion and may become an issue pretty soon.  No erythema/warmth/ tenderness.  Normal wbc and no fevers.  Antibiotics not currently indicated.  Will defer to Dr Graciela HusbandsKlein who appears to be aware on review of his recent rounding notes  3. Acute on chronic systolic dysfunction/ ischemic CM Stable, no ischemic symptoms.  S/p cath 9/17 CHF team has been following Will need close outpatient follow-up now that LV lead has been turned off BP too low for entesto  4. Anxiety SSRI started by Dr Graciela HusbandsKlein  Anticipate possible discharge to home tomorrow if rhythm remains stable  Transfer to telemetry today  Hillis RangeJames Jashae Wiggs, MD 02/01/2016 10:01 AM

## 2016-02-02 LAB — MAGNESIUM: Magnesium: 1.8 mg/dL (ref 1.7–2.4)

## 2016-02-02 LAB — RENAL FUNCTION PANEL
ALBUMIN: 3 g/dL — AB (ref 3.5–5.0)
ANION GAP: 8 (ref 5–15)
BUN: 13 mg/dL (ref 6–20)
CALCIUM: 8.7 mg/dL — AB (ref 8.9–10.3)
CO2: 25 mmol/L (ref 22–32)
CREATININE: 1.23 mg/dL (ref 0.61–1.24)
Chloride: 106 mmol/L (ref 101–111)
GFR calc non Af Amer: >60 mL/min (ref >60)
Glucose, Bld: 98 mg/dL (ref 65–99)
PHOSPHORUS: 3.8 mg/dL (ref 2.5–4.6)
Potassium: 4.4 mmol/L (ref 3.5–5.1)
SODIUM: 139 mmol/L (ref 135–145)

## 2016-02-02 MED ORDER — POTASSIUM CHLORIDE CRYS ER 20 MEQ PO TBCR: 20.0000 meq | EXTENDED_RELEASE_TABLET | Freq: Every day | ORAL | 3 refills | Status: DC

## 2016-02-02 MED ORDER — PAROXETINE HCL 10 MG PO TABS: 10.0000 mg | ORAL_TABLET | Freq: Every day | ORAL | 3 refills | Status: DC

## 2016-02-02 MED ORDER — AMIODARONE HCL 200 MG PO TABS: ORAL_TABLET | ORAL | 0 refills | Status: DC

## 2016-02-02 MED ORDER — ALPRAZOLAM 0.25 MG PO TABS: 0.2500 mg | ORAL_TABLET | Freq: Every evening | ORAL | 0 refills | Status: DC | PRN

## 2016-02-02 MED ORDER — LOSARTAN POTASSIUM 25 MG PO TABS: 12.5000 mg | ORAL_TABLET | Freq: Every day | ORAL | 6 refills | Status: DC

## 2016-02-02 MED ORDER — SPIRONOLACTONE 25 MG PO TABS: 12.5000 mg | ORAL_TABLET | Freq: Every day | ORAL | 5 refills | Status: DC

## 2016-02-02 MED ORDER — MEXILETINE HCL 200 MG PO CAPS: 200.0000 mg | ORAL_CAPSULE | Freq: Two times a day (BID) | ORAL | 3 refills | Status: DC

## 2016-02-02 MED ORDER — MAGNESIUM OXIDE 400 (241.3 MG) MG PO TABS: 400.0000 mg | ORAL_TABLET | Freq: Every day | ORAL | 6 refills | Status: DC

## 2016-02-02 MED ORDER — FAMOTIDINE 20 MG PO TABS: 20.0000 mg | ORAL_TABLET | Freq: Two times a day (BID) | ORAL | 6 refills | Status: DC

## 2016-02-02 NOTE — Progress Notes (Addendum)
SUBJECTIVE: The patient is doing well today.  At this time, he denies chest pain, shortness of breath, or any new concerns.  No further VT.  [name]. ALPRAZolam  0.25 mg Oral QHS  . amiodarone  400 mg Oral BID  . aspirin EC  81 mg Oral Daily  . atorvastatin  40 mg Oral Daily  . bisoprolol  2.5 mg Oral Daily  . clopidogrel  75 mg Oral Daily  . digoxin  0.125 mg Oral Daily  . famotidine  20 mg Oral BID  . fluorescein  1 strip Both Eyes Once  . furosemide  40 mg Oral Daily  . heparin  5,000 Units Subcutaneous Q8H  . losartan  12.5 mg Oral BID  . magnesium oxide  400 mg Oral Daily  . mexiletine  200 mg Oral Q12H  . PARoxetine  10 mg Oral Daily  . potassium chloride  40 mEq Oral Daily  . sodium chloride flush  3 mL Intravenous Q12H  . spironolactone  12.5 mg Oral Daily   . norepinephrine (LEVOPHED) Adult infusion Stopped (01/29/16 2128)  . phenylephrine (NEO-SYNEPHRINE) Adult infusion      OBJECTIVE: Physical Exam: Vitals:   02/01/16 1200 02/01/16 1359 02/01/16 1946 02/02/16 0607  BP: (!) 108/45 (!) 163/60 (!) 135/53 (!) 116/42  Pulse: 60 60 (!) 59 (!) 59  Resp: 16  18 18   Temp: 97.5 F (36.4 C) 97.7 F (36.5 C) 97.5 F (36.4 C) 97.7 F (36.5 C)  TempSrc: Oral Oral Oral Oral  SpO2: 99% 100% 100% 98%  Weight:      Height:        Intake/Output Summary (Last 24 hours) at 02/02/16 0851 Last data filed at 02/01/16 2100  Gross per 24 hour  Intake              480 ml  Output                0 ml  Net              480 ml    Telemetry reveals sinus rhythm, no further VT.  GEN- The patient is well appearing, alert and oriented x 3 today.   Head- normocephalic, atraumatic Eyes-  Sclera clear, conjunctiva pink Ears- hearing intact Oropharynx- clear Neck- supple,  Lungs- Clear to ausculation bilaterally, normal work of breathing Heart- Regular rate and rhythm  GI- soft, NT, ND, + BS Extremities- no clubbing, cyanosis, or edema Skin- there is a blister over the medial edge of  his device pocket which is very thin but large. Neuro- strength and sensation are intact  LABS: Basic Metabolic Panel:  Recent Labs  16/11/9610/09/17 0201 02/02/16 0326  NA 137 139  K 4.0 4.4  CL 104 106  CO2 26 25  GLUCOSE 100* 98  BUN 9 13  CREATININE 1.07 1.23  CALCIUM 8.4* 8.7*  MG 1.9 1.8  PHOS 3.2 3.8   Liver Function Tests:  Recent Labs  02/01/16 0201 02/02/16 0326  ALBUMIN 2.8* 3.0*   No results for input(s): LIPASE, AMYLASE in the last 72 hours. CBC:  Recent Labs  01/31/16 0232 02/01/16 0201  WBC 6.2 7.9  NEUTROABS 4.1 5.2  HGB 11.4* 10.9*  HCT 33.1* 32.3*  MCV 87.6 88.5  PLT 176 188    ASSESSMENT AND PLAN:   1. VT Admitted with VT storm Now quiescent with amiodarone and mexilitine Continue amiodarone 400mg  PO BIX x 1 week then 200mg  BID x 2 weeks then 200mg   daily Keep other medicines at current doses No device programming changes required today  2. ICD pocket concerns There is a large and thin blister over the medial edge of the device pocket.  I suspect that this is a threatened erosion and may become an issue pretty soon.  No erythema/warmth/ tenderness.  Normal wbc and no fevers.  Antibiotics not currently indicated.  Will defer to Dr Graciela HusbandsKlein who appears to be aware on review of his recent rounding notes  3. Acute on chronic systolic dysfunction/ ischemic CM Stable, no ischemic symptoms.  S/p cath 9/17 Will need close outpatient follow-up in CHF clinic now that LV lead has been turned off  4. Anxiety SSRI started by Dr Graciela HusbandsKlein  DC to home today Needs follow-up with Dr Graciela HusbandsKlein in 1-2 weeks Will need close CHF follow-up also.  No driving x 6 months  Hillis RangeJames Chassie Pennix, MD 02/02/2016 8:51 AM

## 2016-02-02 NOTE — Discharge Summary (Signed)
Discharge Summary    Patient ID: [name],  MRN: [id number], DOB/AGE: [birth year] 60 y.o.  Admit date: 01/27/2016 Discharge date: 02/02/2016  Primary Care Provider: Tula Nakayama Primary Cardiologist: Dr. Jens Som CHF: Dr. Shirlee Latch Electrophysiologist: Dr. Graciela Husbands  Discharge Diagnoses    Active Problems:   VF (ventricular fibrillation) [facility])   Encounter for intubation   Acute respiratory failure with hypoxia (HCC)   Hypokalemia   Pulmonary edema   Allergies No Known Allergies  Diagnostic Studies/Procedures   Cath 01/30/16 Conclusion   Left ventriculography not done.  LV not entered due to multiple runs of VT this admission.   Right coronary artery: Dominant.  There was a stent in the mid RCA with 30-40% in-stent restenosis.  Otherwise luminarl irregularities.   Left main: Short, no significant disease.   Left circumflex system: Occluded small to moderate OM1, no change from prior cath. OM2 with 40 ostial stenosis.  Patent stent in proximal LCx.    Left anterior descending system: Patent long stented segment in the proximal LAD.  Small D1.  Small to moderate D2 with ostial 90% stenosis.  D2 originates from the stented segment of proximal LAD so likely jailed.  No change from prior cath.   Impression:  No change compared to prior coronary angiography in 9/17.  No target for intervention.      History of Present Illness     [name] is a 60 y.o. male with PMHx of CAD, ICM with intermittent CHB, RBBB, upgrade to CRT device in October, chronic CHF, active smoker, HTN.  He reports that over the weekend he had a couple of episodes of dizziness, attributed tohis nicotine patch, better after taking it off.  He was at "the shop" getting ready to go intoa shed when he started feeling weak and near syncopal, felt hiself get shocked, and sounds like was in-out feeling himself get shocked about 5 times he thinks, reportedly 5 witnessed by EMS as  well.  Interrogation shows 41 total shocks reviewed with Dr. Graciela Husbands, appears VFlutter that was shocked and degenerated into VF.  All are appropriate.  He reports he had been feeling "fine" until then otherwise with no anginal type symptoms.   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 He required ventilator support initially 2nd to pulmonary edema from arrhythmia. Here was started on amio drip and lidocaine drip.   1. VT storm:  Quiet now on amiodarone and lidocaine gtts. ?Trigger =>K was mildly low but should not have caused. ?Triggered by BiV pacing. No ACS, coronary angiography this admission showed no change from 9/17 cath. May be electromechanical issue which suggests poor prognosis. Continue amiodarone 400mg  PO BID x 1 week then 200mg  BID x 2 weeks then 200mg  daily.  No driving x 6 months.   2. ICD pocket concerns There is a large and thin blister over the medial edge of the device pocket.  Suspected that this is a threatened erosion and may become an issue pretty soon.  No erythema/warmth/ tenderness.  Normal wbc and no fevers.  Antibiotics not currently indicated.  Will defer to Dr Graciela Husbands who appears to be aware on review of his recent rounding notes.  3.  Acute on chronic systolic CHF/ Ischemic cardiomyopathy - required pressor support initially. Entresto discontinued due to low BP. Multiple dose adjustment. He will need close outpatient follow-up in CHF clinic now that LV  lead has been turned off  4. CAD - No chest pain prior to event, stable cath in 9/17. Mild troponin elevation likely due to multiple shocks, doubt ACS. Given VT storm, did re-look cath on 12/7.  This showed stable coronaries compared to 9/17 cath.   - Continue ASA, statin, Plavix.   5. Anxiety -  Suspect he will have significant PTSD. Paxil started.   The patient has been seen by Dr. Johney Frame today and deemed ready for  discharge home. All follow-up appointments have been scheduled. Discharge medications are listed below.  _____________   Discharge Vitals Blood pressure (!) 116/42, pulse 60, temperature 97.7 F (36.5 C), temperature source Oral, resp. rate 18, height 5\' 11"  (1.803 m), weight 200 lb 2.8 oz (90.8 kg), SpO2 98 %.  Filed Weights   01/27/16 1431 01/28/16 0530  Weight: 200 lb (90.7 kg) 200 lb 2.8 oz (90.8 kg)    Labs & Radiologic Studies     CBC  Recent Labs  01/31/16 0232 02/01/16 0201  WBC 6.2 7.9  NEUTROABS 4.1 5.2  HGB 11.4* 10.9*  HCT 33.1* 32.3*  MCV 87.6 88.5  PLT 176 188   Basic Metabolic Panel  Recent Labs  02/01/16 0201 02/02/16 0326  NA 137 139  K 4.0 4.4  CL 104 106  CO2 26 25  GLUCOSE 100* 98  BUN 9 13  CREATININE 1.07 1.23  CALCIUM 8.4* 8.7*  MG 1.9 1.8  PHOS 3.2 3.8   Liver Function Tests  Recent Labs  02/01/16 0201 02/02/16 0326  ALBUMIN 2.8* 3.0*   No results for input(s): LIPASE, AMYLASE in the last 72 hours. Cardiac Enzymes No results for input(s): CKTOTAL, CKMB, CKMBINDEX, TROPONINI in the last 72 hours. BNP Invalid input(s): POCBNP D-Dimer No results for input(s): DDIMER in the last 72 hours. Hemoglobin A1C No results for input(s): HGBA1C in the last 72 hours. Fasting Lipid Panel No results for input(s): CHOL, HDL, LDLCALC, TRIG, CHOLHDL, LDLDIRECT in the last 72 hours. Thyroid Function Tests No results for input(s): TSH, T4TOTAL, T3FREE, THYROIDAB in the last 72 hours.  Invalid input(s): FREET3  Dg Chest Port 1 View  Result Date: 01/29/2016 CLINICAL DATA:  Pulmonary edema EXAM: PORTABLE CHEST 1 VIEW COMPARISON:  Two days ago FINDINGS: Endotracheal tube tip between the clavicular heads and carina. An orogastric tube reaches the stomach. Biventricular ICD/ pacer in stable position. Cardiomegaly. Coronary stenting. Low volume chest with congested appearance of the vasculature. No Kerley line, air bronchogram effusion, or  pneumothorax. IMPRESSION: 1. Stable positioning of endotracheal and orogastric tubes. 2. Stable cardiomegaly and vascular congestion. Electronically Signed   By: Marnee Spring M.D.   On: 01/29/2016 07:12   Dg Chest Port 1 View  Result Date: 01/27/2016 CLINICAL DATA:  Ventricular flutter and fibrillation with multiple ICD shocks. Intubation. EXAM: PORTABLE CHEST 1 VIEW COMPARISON:  01/27/2016 and prior exams FINDINGS: Cardiomegaly, left-sided ICD, defibrillator pads overlying the chest again noted. Pulmonary vascular congestion is noted with possible mild interstitial edema. An endotracheal tube is noted with tip 4 cm above the carina and NG tube entering the stomach with tip off the field of view now noted. There is no evidence of pneumothorax or pleural effusion. IMPRESSION: Cardiomegaly with pulmonary vascular congestion and possible mild interstitial edema. Support apparatus as described. Electronically Signed   By: Harmon Pier M.D.   On: 01/27/2016 15:42   Dg Chest Port 1 View  Result Date: 01/27/2016 CLINICAL DATA:  Evaluate pacemaker leads.ICD firing. EXAM: PORTABLE CHEST  1 VIEW COMPARISON:  12/16/2015 FINDINGS: Chronic cardiopericardial enlargement. Status post coronary stenting. Biventricular ICD/pacer from the left with leads in stable position. Mild basilar interstitial crowding. There is no edema, consolidation, effusion, or pneumothorax. IMPRESSION: Biventricular ICD/ pacer has a stable appearance since 12/16/2015. Electronically Signed   By: Marnee SpringJonathon  Watts M.D.   On: 01/27/2016 12:10    Disposition   Pt is being discharged home today in good condition.  Follow-up Plans & Appointments    Follow-up Information    Sherryl MangesSteven Klein, MD Follow up on 02/07/2016.   Specialty:  Cardiology Why:  at 2:30PM Contact information: [address] [place] [address] [phone]        Marca Anconaalton McLean, MD. Schedule an appointment as soon as possible for a visit in 2  week(s).   Specialty:  Cardiology Contact information: [address] [place] [address] [phone]        Tula NakayamaKARAM,PHILLIP JEROME, MD Follow up in 3 day(s).   Specialty:  Family Medicine Why:  for post hosptial and anxiety  Contact information: [address] [place] [address] [phone]        Olga MillersBrian Crenshaw, MD .   Specialty:  Cardiology Contact information: [address] [place] [address] [phone]          Discharge Instructions    Diet - low sodium heart healthy    Complete by:  As directed    Discharge instructions    Complete by:  As directed    No driving for 6 months.   Increase activity slowly    Complete by:  As directed       Discharge Medications   Current Discharge Medication List    START taking these medications   Details  amiodarone (PACERONE) 200 MG tablet Take 400mg  (2 tablets)  twice a day until 02/06/16;  then 200mg  ( 1 tablet) BID from 02/07/16 to 02/10/16; and then 200mg  daily by mouth Qty: 60 tablet, Refills: 0    famotidine (PEPCID) 20 MG tablet Take 1 tablet (20 mg total) by mouth 2 (two) times daily. Qty: 60 tablet, Refills: 6    magnesium oxide (MAG-OX) 400 (241.3 Mg) MG tablet Take 1 tablet (400 mg total) by mouth daily. Qty: 30 tablet, Refills: 6    mexiletine (MEXITIL) 200 MG capsule Take 1 capsule (200 mg total) by mouth every 12 (twelve) hours. Qty: 60 capsule, Refills: 3    PARoxetine (PAXIL) 10 MG tablet Take 1 tablet (10 mg total) by mouth daily. Qty: 30 tablet, Refills: 3    potassium chloride SA (K-DUR,KLOR-CON) 20 MEQ tablet Take 1 tablet (20 mEq total) by mouth daily. Qty: 30 tablet, Refills: 3      CONTINUE these medications which have CHANGED   Details  ALPRAZolam (XANAX) 0.25 MG tablet Take 1 tablet (0.25 mg total) by mouth at bedtime as needed for anxiety. F/u with PCP for refills. Qty: 7 tablet, Refills: 0    losartan (COZAAR) 25 MG tablet Take 0.5 tablets (12.5  mg total) by mouth daily. Qty: 30 tablet, Refills: 6    spironolactone (ALDACTONE) 25 MG tablet Take 0.5 tablets (12.5 mg total) by mouth daily. Qty: 30 tablet, Refills: 5      CONTINUE these medications which have NOT CHANGED   Details  albuterol (PROAIR HFA) 108 (90 Base) MCG/ACT inhaler Inhale 2 puffs into the lungs every 4 (four) hours as needed for wheezing or shortness of breath.  aspirin EC 81 MG tablet Take 81 mg by mouth daily.    atorvastatin (LIPITOR) 40 MG tablet Take 40 mg by mouth daily.    bisoprolol (ZEBETA) 5 MG tablet Take 0.5 tablets (2.5 mg total) by mouth at bedtime. Qty: 15 tablet, Refills: 3    clopidogrel (PLAVIX) 75 MG tablet Take 75 mg by mouth daily.     digoxin (LANOXIN) 0.125 MG tablet Take 1 tablet (0.125 mg total) by mouth daily. Qty: 30 tablet, Refills: 5    furosemide (LASIX) 40 MG tablet Take 40 mg by mouth See admin instructions. Take 1 tablet (40 mg) by mouth daily, may also take an additional tablet as needed for fluid buildup    hydrOXYzine (VISTARIL) 25 MG capsule Take 25 mg by mouth at bedtime as needed (sleep).    nicotine (NICODERM CQ - DOSED IN MG/24 HOURS) 21 mg/24hr patch Place 21 mg onto the skin daily.    nitroGLYCERIN (NITROSTAT) 0.4 MG SL tablet Place 1 tablet (0.4 mg total) under the tongue every 5 (five) minutes as needed. For chest pain Qty: 25 tablet, Refills: 11   Associated Diagnoses: SOB (shortness of breath)      STOP taking these medications     sacubitril-valsartan (ENTRESTO) 24-26 MG          Outstanding Labs/Studies   BMET during post hospital appointment   Duration of Discharge Encounter   Greater than 30 minutes including physician time.  Signed, Bhagat,Bhavinkumar PA-C 02/02/2016, 11:10 AM   Hillis RangeJames Mikhaela Zaugg MD, [facility] 02/02/2016 3:50 PM

## 2016-02-02 NOTE — Progress Notes (Signed)
Pt has been discharged home with family. IV and telemetry box removed. Pt received discharge instructions and all questions were answered. Pt received a prescription for xanax and was instructed to follow up with primary MD for refills. Pt verbalized understanding. Pt left with all of his belongings. Pt left the unit via wheelchair and was accompanied by this RN and pt's visitor.   Berdine DanceLauren Moffitt BSN, RN

## 2016-02-04 ENCOUNTER — Telehealth: Payer: Self-pay | Admitting: Internal Medicine

## 2016-02-04 NOTE — Telephone Encounter (Signed)
In reviewing the patient's chart, he was prescribed paxil 10 mg once daily at discharge on 12/10. I called the pharmacy to confirm they received this, which they did- they report that patient picked this up on 12/10. I called and spoke with the patient and advised him that per the pharmacy he did pick this up on 12/10. He looked through his medications and realized he did have this- he was uncertain what this was for. He states he had a episode of anxiety last night because he was by himself for a little bit.  He states he is uncertain of what most of his medications are for- I advised I will print a list with the uses for each drug and mail this to him. He was grateful for this. Mailing address confirmed.

## 2016-02-04 NOTE — Telephone Encounter (Signed)
New message       Pt states that Dr Graciela HusbandsKlein was going to give him some medication for PTSD but he did not get a presc.  Please call and let him know what medication he was going to get.

## 2016-02-06 ENCOUNTER — Telehealth: Payer: Self-pay | Admitting: *Deleted

## 2016-02-06 NOTE — Telephone Encounter (Signed)
PA for losartan 25 mg 1/2 tablet once daily complete and approved for one year. ZO#[id number]#[phone]

## 2016-02-07 ENCOUNTER — Encounter: Payer: Self-pay | Admitting: Internal Medicine

## 2016-02-07 ENCOUNTER — Encounter (INDEPENDENT_AMBULATORY_CARE_PROVIDER_SITE_OTHER): Payer: Self-pay

## 2016-02-07 ENCOUNTER — Ambulatory Visit (INDEPENDENT_AMBULATORY_CARE_PROVIDER_SITE_OTHER): Payer: Medicaid Other | Admitting: Internal Medicine

## 2016-02-07 VITALS — BP 116/62 | HR 89 | Ht 71.0 in | Wt 200.0 lb

## 2016-02-07 DIAGNOSIS — I4729 Other ventricular tachycardia: Secondary | ICD-10-CM

## 2016-02-07 DIAGNOSIS — Z95810 Presence of automatic (implantable) cardiac defibrillator: Secondary | ICD-10-CM

## 2016-02-07 DIAGNOSIS — I255 Ischemic cardiomyopathy: Secondary | ICD-10-CM | POA: Diagnosis not present

## 2016-02-07 DIAGNOSIS — I472 Ventricular tachycardia: Secondary | ICD-10-CM

## 2016-02-07 DIAGNOSIS — I493 Ventricular premature depolarization: Secondary | ICD-10-CM

## 2016-02-07 DIAGNOSIS — Z79899 Other long term (current) drug therapy: Secondary | ICD-10-CM

## 2016-02-07 MED ORDER — DIGOXIN 125 MCG PO TABS: 0.1250 mg | ORAL_TABLET | Freq: Every day | ORAL | 5 refills | Status: DC

## 2016-02-07 NOTE — Progress Notes (Signed)
Patient Care Team: Tula NakayamaPhillip Jerome Karam, MD as PCP - General (Family Medicine) Lewayne BuntingBrian S Crenshaw, MD as PCP - Cardiology (Cardiology) No Pcp Per Patient (General Practice)   HPI  [name] is a 60 y.o. male is seen in followup for ICD implanted as per the MASTER trial with prior myocardial infarction complicated by shock and prior stenting.   He is seen after hospitalization for VT storm 12/17: Because of recurrent shocks we initially intubated and quiet his heart down. This was done with amiodarone and lidocaine; later was transitioned to amiodarone   we ended up putting him on Paxil for acute PTSD.     12/17 catheterization was undertaken due to the likelihood of suspicion because of the need to identify a trigger.  Results unchanged from 2017     Because of ventricular tachycardia and exertional chest pain shortness of breath he underwent catheterization April 2012 demonstrating a new lesion in the mid LAD that was felt to be critical;  proximal mid LAD stents and mid RCA stents were patent. LVEF was 20-25%    Left heart cath 4/13 >>There is a long stented segment in the proximal to mid LAD is patent. There is diffuse 10-20% narrowing within the stent. The recently placed stent in the mid LAD is widely patent. It jails the ostium of the second diagonal branch with a 90% lesion. Theres is TIMI-3 in the ostium of the D2. Left circumflex (LCx): A left circumflex is a large vessel. There is a 50-60% stenosis in the proximal vessel. He first obtuse marginal vessel is very small in caliber and is sub-totally occluded proximally. The second and third marginal branches are large vessels. There is 30% lesion in ostium of OM-2  Right coronary artery (RCA): The right coronary has an inferior take off. It is a codominant vessel. There is 40% narrowing at the ostium. A stent is noted in the mid right coronary and is widely patent with less than 10-20% irregularities. There is a 30-40% lesion in the  distal right coronary. Stable CAD as described above. The D2 is jailed by the recently placed LAD stent but there is good flow He underwent stenting again in July 2014 Cardiac catheterization September 2017 showed an 85% second diagonal (covered by LAD stent; chronic), 99% first marginal (chronic) and patent stents in the proximal to mid LAD, proximal to mid circumflex, second obtuse marginal and mid right coronary artery; medical therapy recommended.   Echo 10/16  EF 15%    He was seen by Dr. Marsa ArisBC last week. At that time he was noted to be in Mobitz 1 heart block. Looking back over the summer he has had sporadic episodes of feeling quite weak. He has been tired. Over the last couple of weeks he has felt considerably worse. While he was in-hospital 9/17 telemetry demonstrated slow ventricular tachycardia but no heart block.      Past Medical History:  Diagnosis Date  . Anxiety   . Automatic implantable cardioverter-defibrillator in situ    a. Medtronic ICD.  [name]. CAD (coronary artery disease)    a.  Severe LAD stenosis 2/2 acute thrombus - BMS 2009;  b. 06/01/11 Cath - LAD 20 isr, 5822m (3.0x16 Veri-flex BMS), OM1 100p, EF 20-25%;  c. 07/2012 Abnl Cardiolite;  d. 08/2012 Cath/PCI: LM nl, LAD patent stents, D2 80-90 (jailed), LCX 90 p/m (4.0x24 Promus Premier DES), OM1 100, OM2 80-90p (3.0x20 Promus Premier DES), RCA patent mid stent, 40d, EF 25%.  e. 10/29/15  LHC stable disease with patent stents  . Chronic systolic CHF (congestive heart failure), NYHA class 3 (HCC) - low output state 12/12/2014  . Depression   . Dyspnea   . History of rheumatic fever 1962  . HLD (hyperlipidemia) mixed  . HTN (hypertension)   . Ischemic cardiomyopathy    a.  EF 30-35% 2010;  b.  EF 20-25% by LV gram 08/2012. c. EF 15% by echo 11/2014.  [name] Myocardial infarction 1998; 2002; ~ 2010  . Paroxysmal ventricular tachycardia (HCC)    a. VFlutter  CL 210 msec  Rx shock 04/2011  . PVC's (premature ventricular contractions)   .  RBBB   . TIA (transient ischemic attack)    a. Dx 11/2014, continued on ASA/Plavix.    Past Surgical History:  Procedure Laterality Date  . CARDIAC CATHETERIZATION N/A 10/29/2015   Procedure: Right/Left Heart Cath and Coronary Angiography;  Surgeon: Peter M Swaziland, MD;  Location: [facility] INVASIVE CV LAB;  Service: Cardiovascular;  Laterality: N/A;  . CARDIAC DEFIBRILLATOR PLACEMENT     MDT single chamber primary prevention ICD implanted by Dr Graciela Husbands as part of MASTER study  . EP IMPLANTABLE DEVICE N/A 12/11/2015   Procedure: BiV Upgrade;  Surgeon: [name], MD;  Location: [facility] INVASIVE CV LAB;  Service: Cardiovascular;  Laterality: N/A;  . EP IMPLANTABLE DEVICE N/A 12/11/2015   Procedure: Pocket Revision/Relocation;  Surgeon: [name], MD;  Location: [facility] INVASIVE CV LAB;  Service: Cardiovascular;  Laterality: N/A;  . EP IMPLANTABLE DEVICE N/A 12/11/2015   Procedure: Lead Revision/Repair;  Surgeon: [name], MD;  Location: [facility] INVASIVE CV LAB;  Service: Cardiovascular;  Laterality: N/A;  . HERNIA REPAIR     "w/gallbladder OR" (08/29/2012)  . LAPAROSCOPIC CHOLECYSTECTOMY    . LEFT HEART CATHETERIZATION WITH CORONARY ANGIOGRAM N/A 06/08/2011   Procedure: LEFT HEART CATHETERIZATION WITH CORONARY ANGIOGRAM;  Surgeon: Dolores Patty, MD;  Location: [facility] CATH LAB;  Service: Cardiovascular;  Laterality: N/A;  . PERCUTANEOUS CORONARY STENT INTERVENTION (PCI-S) N/A 06/01/2011   Procedure: PERCUTANEOUS CORONARY STENT INTERVENTION (PCI-S);  Surgeon: Peter M Swaziland, MD;  Location: [facility] CATH LAB;  Service: Cardiovascular;  Laterality: N/A;  . PERCUTANEOUS CORONARY STENT INTERVENTION (PCI-S) N/A 08/29/2012   Procedure: PERCUTANEOUS CORONARY STENT INTERVENTION (PCI-S);  Surgeon: Peter M Swaziland, MD;  Location: [facility] CATH LAB;  Service: Cardiovascular;  Laterality: N/A;  . PERIPHERAL VASCULAR CATHETERIZATION  12/11/2015   Procedure: Upper Extremity Venography;  Surgeon: [name], MD;  Location: [facility]  INVASIVE CV LAB;  Service: Cardiovascular;;    Current Outpatient Prescriptions  Medication Sig Dispense Refill  . albuterol (PROAIR HFA) 108 (90 Base) MCG/ACT inhaler Inhale 2 puffs into the lungs every 4 (four) hours as needed for wheezing or shortness of breath.    . ALPRAZolam (XANAX) 0.25 MG tablet Take 1 tablet (0.25 mg total) by mouth at bedtime as needed for anxiety. F/u with PCP for refills. 7 tablet 0  . amiodarone (PACERONE) 200 MG tablet Take 400mg  (2 tablets)  twice a day until 02/06/16;  then 200mg  ( 1 tablet) BID from 02/07/16 to 02/10/16; and then 200mg  daily by mouth 60 tablet 0  . aspirin EC 81 MG tablet Take 81 mg by mouth daily.    [name] atorvastatin (LIPITOR) 40 MG tablet Take 40 mg by mouth daily.    . bisoprolol (ZEBETA) 5 MG tablet Take 0.5 tablets (2.5 mg total) by mouth at bedtime. 15 tablet 3  . clopidogrel (PLAVIX) 75 MG tablet  Take 75 mg by mouth daily.     . digoxin (LANOXIN) 0.125 MG tablet Take 1 tablet (0.125 mg total) by mouth daily. 30 tablet 5  . famotidine (PEPCID) 20 MG tablet Take 1 tablet (20 mg total) by mouth 2 (two) times daily. 60 tablet 6  . furosemide (LASIX) 40 MG tablet Take 40 mg by mouth See admin instructions. Take 1 tablet (40 mg) by mouth daily, may also take an additional tablet as needed for fluid buildup    . hydrOXYzine (VISTARIL) 25 MG capsule Take 25 mg by mouth at bedtime as needed (sleep).    . losartan (COZAAR) 25 MG tablet Take 0.5 tablets (12.5 mg total) by mouth daily. 30 tablet 6  . magnesium oxide (MAG-OX) 400 (241.3 Mg) MG tablet Take 1 tablet (400 mg total) by mouth daily. 30 tablet 6  . mexiletine (MEXITIL) 200 MG capsule Take 1 capsule (200 mg total) by mouth every 12 (twelve) hours. 60 capsule 3  . nicotine (NICODERM CQ - DOSED IN MG/24 HOURS) 21 mg/24hr patch Place 21 mg onto the skin daily.    . nitroGLYCERIN (NITROSTAT) 0.4 MG SL tablet Place 1 tablet (0.4 mg total) under the tongue every 5 (five) minutes as needed. For chest  pain 25 tablet 11  . PARoxetine (PAXIL) 10 MG tablet Take 1 tablet (10 mg total) by mouth daily. 30 tablet 3  . potassium chloride SA (K-DUR,KLOR-CON) 20 MEQ tablet Take 1 tablet (20 mEq total) by mouth daily. 30 tablet 3  . spironolactone (ALDACTONE) 25 MG tablet Take 0.5 tablets (12.5 mg total) by mouth daily. 30 tablet 5   No current facility-administered medications for this visit.     No Known Allergies  Review of Systems negative except from HPI and PMH  Physical Exam BP 116/62   Pulse 89   Ht 5\' 11"  (1.803 m)   Wt 200 lb (90.7 kg)   SpO2 95%   BMI 27.89 kg/m  Well developed and nourished in no acute distress HENT normal Neck supple with JVP-flat Clear Regular rate and rhythm, no murmurs or gallops Abd-soft with active BS No Clubbing cyanosis edema Skin-warm and dry A & Oriented  Grossly normal sensory and motor function tearful  ECG demonstrates sinus rhythm at  84 23/18/49 Right bundle branch block left axis deviation Septal MI   Assessment and  Plan  Ischemic cardiomyopathy Without symptoms of ischemia  AV Heart block-Mobitz : 1 and 2-1   Ventricular tachycardia Storm  Implantable defibrillator -Medtronic  Single chamberThe patient's device was interrogated.  The information was reviewed. No changes were made in the programming.    \    the patient had VT storm. Thankfully his heart rhythm is a combination of amiodarone on decreasing doses facility. We will plan to see him in 3 months and at that time start treating his mexiletine 200--150 mg daily  We will check his digoxin level today.  He will continue his medication for his PTSD  Reminded that he cannot drive for 6 months  We spent more than 50% of our >25 min visit in face to face counseling regarding the above

## 2016-02-07 NOTE — Patient Instructions (Signed)
Medication Instructions: Your physician recommends that you continue on your current medications as directed. Please refer to the Current Medication list given to you today.   Labwork: Your physician recommends that you have lab work today : DIG Level   Procedures/Testing: None Ordered  Follow-Up: Your physician recommends that you schedule a follow-up appointment in: 3 months with Dr. Graciela HusbandsKlein.   Any Additional Special Instructions Will Be Listed Below (If Applicable).     If you need a refill on your cardiac medications before your next appointment, please call your pharmacy.

## 2016-02-08 LAB — DIGOXIN LEVEL: Digoxin Level: <0.5 ug/L — ABNORMAL LOW (ref 0.8–2.0)

## 2016-02-10 ENCOUNTER — Telehealth: Payer: Self-pay | Admitting: Internal Medicine

## 2016-02-10 ENCOUNTER — Telehealth (HOSPITAL_COMMUNITY): Payer: Self-pay | Admitting: Pharmacist

## 2016-02-10 NOTE — Telephone Encounter (Signed)
Bisoprolol 2.5 mg daily PA approved by [place] Medicaid through 01/26/17.   Tyler DeisErika K. Bonnye FavaNicolsen, PharmD, BCPS, CPP Clinical Pharmacist Pager: [phone] Phone: [phone] 02/10/2016 11:08 AM

## 2016-02-10 NOTE — Telephone Encounter (Signed)
Reviewed with Dr. Excell Seltzerooper (office DOD) and OK to wait for Dr. Graciela HusbandsKlein to review tomorrow regarding possible interaction.  I spoke with Joe at [facility] and told him we would contact him after reviewed by MD tomorrow.

## 2016-02-10 NOTE — Telephone Encounter (Signed)
I spoke with pt who reports pharmacist asked him to call our office regarding possible interaction between amiodarone and digoxin.  Pt reports he has been out of digoxin and has not taken for 5 days.  Today is the day he was to decrease amiodarone to 200 mg daily and he has made this change.

## 2016-02-10 NOTE — Telephone Encounter (Signed)
°  New Prob    States Amiodarone and Digoxin came back with a level 2 interaction. Not able to fill until MD states he is aware. Please call.

## 2016-02-10 NOTE — Telephone Encounter (Signed)
Was told by his pharmacist to call us regarding his medication.

## 2016-02-10 NOTE — Telephone Encounter (Signed)
Will forward to Dr. Graciela HusbandsKlein for review. Pt aware Dr. Graciela HusbandsKlein will be back in office tomorrow.

## 2016-02-11 ENCOUNTER — Telehealth: Payer: Self-pay

## 2016-02-11 ENCOUNTER — Other Ambulatory Visit: Payer: Self-pay

## 2016-02-11 MED ORDER — DIGOXIN 125 MCG PO TABS: ORAL_TABLET | ORAL | 3 refills | Status: DC

## 2016-02-11 NOTE — Telephone Encounter (Signed)
Reviewed with Dr. Graciela HusbandsKlein- stay on amiodarone and decrease digoxin to 0.125 mg one tablet by mouth every other day. The patient will need a repeat digoxin level in about 2 weeks. I have notified the patient of the above.  He verbalizes understanding.  I have notified the pharmacy of the change in the patient's prescription. He will have his digoxin level repeated at his CHF appt on 12/29- message will be sent to CHF clinic nurse.

## 2016-02-11 NOTE — Telephone Encounter (Signed)
Pt is calling back because he still has not received an answer in regards to the Amio and Dig interaction and as to whether he can continue to take the two medications together. I informed him that Dr. Graciela HusbandsKlein has not given an answer yet and that I would be with him this afternoon and that I would ask again. I told him that whenever I received and answer I would contact his pharmacy as the whether they should or should not fill the medication and then I would call him with Dr. Graciela HusbandsKlein recommendations. He provided me with his cell phone number [phone] and told me to leave the detailed message there. We were both agreeable to plan.

## 2016-02-11 NOTE — Telephone Encounter (Signed)
Pt is agreeable and aware of normal results

## 2016-02-11 NOTE — Telephone Encounter (Signed)
Pt calling to verify refill was sent to pharmacy. I verified with patient that di refill was sent and received 02/07/16 to [facility]. Pt was agreeable.

## 2016-02-13 ENCOUNTER — Other Ambulatory Visit: Payer: Self-pay

## 2016-02-13 MED ORDER — FUROSEMIDE 40 MG PO TABS: 40.0000 mg | ORAL_TABLET | ORAL | 11 refills | Status: DC

## 2016-02-13 MED ORDER — BISOPROLOL FUMARATE 5 MG PO TABS: 2.5000 mg | ORAL_TABLET | Freq: Every day | ORAL | 11 refills | Status: DC

## 2016-02-14 NOTE — Telephone Encounter (Signed)
Patient called and stated that he spoke to someone at our office yesterday and requested a refill on furosemide. He stated that [facility] and was informed that they did not receive an rx. He is now out of medication. [facility] have [facility] and given a verbal order. Patient aware and appreciative.

## 2016-02-21 ENCOUNTER — Ambulatory Visit (HOSPITAL_COMMUNITY)
Admission: RE | Admit: 2016-02-21 | Discharge: 2016-02-21 | Disposition: A | Discharge status: Home / Self Care | Payer: Medicaid Other | Source: Ambulatory Visit | Attending: Cardiology | Admitting: Cardiology

## 2016-02-21 ENCOUNTER — Encounter (HOSPITAL_COMMUNITY): Payer: Self-pay

## 2016-02-21 ENCOUNTER — Encounter: Payer: Self-pay | Admitting: Internal Medicine

## 2016-02-21 VITALS — BP 112/70 | HR 72 | Wt 202.8 lb

## 2016-02-21 DIAGNOSIS — Z79899 Other long term (current) drug therapy: Secondary | ICD-10-CM | POA: Insufficient documentation

## 2016-02-21 DIAGNOSIS — Z5189 Encounter for other specified aftercare: Secondary | ICD-10-CM | POA: Insufficient documentation

## 2016-02-21 DIAGNOSIS — I5022 Chronic systolic (congestive) heart failure: Secondary | ICD-10-CM | POA: Diagnosis not present

## 2016-02-21 DIAGNOSIS — Z7982 Long term (current) use of aspirin: Secondary | ICD-10-CM | POA: Diagnosis not present

## 2016-02-21 DIAGNOSIS — I2510 Atherosclerotic heart disease of native coronary artery without angina pectoris: Secondary | ICD-10-CM | POA: Insufficient documentation

## 2016-02-21 DIAGNOSIS — I472 Ventricular tachycardia: Secondary | ICD-10-CM | POA: Insufficient documentation

## 2016-02-21 DIAGNOSIS — F4310 Post-traumatic stress disorder, unspecified: Secondary | ICD-10-CM | POA: Insufficient documentation

## 2016-02-21 DIAGNOSIS — Z87891 Personal history of nicotine dependence: Secondary | ICD-10-CM | POA: Insufficient documentation

## 2016-02-21 DIAGNOSIS — I712 Thoracic aortic aneurysm, without rupture: Secondary | ICD-10-CM | POA: Insufficient documentation

## 2016-02-21 DIAGNOSIS — I4729 Other ventricular tachycardia: Secondary | ICD-10-CM

## 2016-02-21 LAB — COMPREHENSIVE METABOLIC PANEL
ALT: 32 U/L (ref 17–63)
AST: 27 U/L (ref 15–41)
Albumin: 4.1 g/dL (ref 3.5–5.0)
Alkaline Phosphatase: 53 U/L (ref 38–126)
Anion gap: 9 (ref 5–15)
BUN: 26 mg/dL — AB (ref 6–20)
CHLORIDE: 102 mmol/L (ref 101–111)
CO2: 26 mmol/L (ref 22–32)
CREATININE: 1.53 mg/dL — AB (ref 0.61–1.24)
Calcium: 9.6 mg/dL (ref 8.9–10.3)
GFR calc Af Amer: 55 mL/min — ABNORMAL LOW (ref >60)
GFR calc non Af Amer: 47 mL/min — ABNORMAL LOW (ref >60)
GLUCOSE: 90 mg/dL (ref 65–99)
POTASSIUM: 4.7 mmol/L (ref 3.5–5.1)
Sodium: 137 mmol/L (ref 135–145)
Total Bilirubin: 1 mg/dL (ref 0.3–1.2)
Total Protein: 7.6 g/dL (ref 6.5–8.1)

## 2016-02-21 LAB — DIGOXIN LEVEL: Digoxin Level: <0.2 ng/mL — ABNORMAL LOW (ref 0.8–2.0)

## 2016-02-21 LAB — TSH: TSH: 5.125 u[IU]/mL — ABNORMAL HIGH (ref 0.350–4.500)

## 2016-02-21 MED ORDER — LOSARTAN POTASSIUM 25 MG PO TABS: 25.0000 mg | ORAL_TABLET | Freq: Every day | ORAL | 6 refills | Status: DC

## 2016-02-21 MED ORDER — ALPRAZOLAM 0.25 MG PO TABS: 0.2500 mg | ORAL_TABLET | Freq: Every evening | ORAL | 0 refills | Status: DC | PRN

## 2016-02-21 MED ORDER — SPIRONOLACTONE 25 MG PO TABS: 25.0000 mg | ORAL_TABLET | Freq: Every day | ORAL | 5 refills | Status: DC

## 2016-02-21 NOTE — Patient Instructions (Signed)
EKG today.  Routine lab work today. Will notify you of abnormal results, otherwise no news is good news!  Return in 10 days for repeat labs (03/02/2016).  INCREASE Losartan to 25 mg (1 whole tablet) once daily.  INCREASE Spironolactone to 25 mg (1 whole tablet) once daily.  Xanax has been refilled for a 30 day supply. Follow up with PCP for any further refills.  Follow up with [name] in 1 month.  Happy New Year!  Do the following things EVERYDAY: 1) Weigh yourself in the morning before breakfast. Write it down and keep it in a log. 2) Take your medicines as prescribed 3) Eat low salt foods-Limit salt (sodium) to 2000 mg per day.  4) Stay as active as you can everyday 5) Limit all fluids for the day to less than 2 liters

## 2016-02-22 NOTE — Progress Notes (Signed)
Cardiology: Dr. Jens Somrenshaw HF Cardiology: [name]  60 yo with history of smoking, CAD s/p multiple PCIs, chronic systolic CHF/ischemic cardiomyopathy presents for HF clinic followup.  Cardiac cath in 9/17 showed stable anatomy, no intervention.  EF has been markedly reduced for several years now.  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 PICC was placed and low cardiac output confirmed.  He was started on milrinone and diuresed.  He had CRT upgrade of his Medtronic device.    He was admitted again in 12/17 with multiple ICD discharges in the setting of VT storm.  He was started on amiodarone and mexiletine.  He has had PTSD symptoms since that time. He has had no further VT.  In the hospital, LV lead was turned off (?pro-arrhythmic).  Coronary angiography was done, showing no change compared to 9/17.    He returns for followup today.  Generally doing ok other than anxiety and difficulty sleeping.  No dyspnea walking on flat ground.  He is able to walk 100 yards uphill to his mailbox without stopping.  No chest pain.  No orthopnea/PND.  No palpitations.  No lightheadedness.    ECG: NSR with V-pacing   Optivol: No further VT, fluid index < threshold with stable thoracic impedance, no atrial fibrillation.   Labs (10/17): hgb 15.8, K 4.3, creatinine 1.11 => 0.98, AST 78 => 43, ALT 105 => 61, digoxin 0.4 Labs (12/17): digoxin < 0.5, mg 1.8, K 4.4, creatinine 1.23  PMH: 1. H/o smoking: PFTs (6/17) with minimal obstruction on PFTs. Quit 12/17.  2. Coronary artery disease: Multiple PCIs in the past.  - LHC/RHC (9/17): 85% ostial D2, 99% ostial OM1 (no change from prior) with patent LAD, LCX, OM2, and RCA stents; mean RA 8, PA 58/27 mean 39, mean PCWP 29, CI 2.25, PVR 2.1.  - LHC (12/17): No change compared to 9/17.  3. Chronic systolic CHF: Ischemic cardiomyopathy.   - Echo (10/17): EF 15%, moderate  MR, mild to moderate AI, mildly decreased RV systolic function.  - Medtronic CRT-D device => LV lead turned off due to concern for possible pro-arrhythmia.  - CPX (11/17): peak VO2 20.6, VE/VCO2 slope 38, RER 0.97.  Submaximal, probably mild to moderate HF limitation.  4. 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  5. H/o TIA 6. Ascending aortic aneurysm: 4.1 cm on CTA chest in 8/17.  7. Atrial tachycardia: Noted on device interrogation.  8. 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  LV lead turned off due to concern for pro-arrhythmia.   ROS: All systems reviewed and negative except as per HPI.   Social History   Social History  . Marital status: Legally Separated    Spouse name: N/A  . Number of children: N/A  . Years of education: N/A   Occupational History  . Not on file.   Social History Main Topics  . Smoking status: Former Smoker    Packs/day: 0.50    Years: 44.00    Types: Cigarettes    Quit date: 11/20/2015  . Smokeless tobacco: Never Used  . Alcohol use No     Comment: 08/29/2012 "6-8 beers once/wk". Has not had alcohol in the last 6-8 months  . Drug use: No  . Sexual activity: Not Currently   Other Topics Concern  . Not on file   Social  History Narrative   Married; full time.    Family History  Problem Relation Age of Onset  . Cancer Father   . Cancer Sister     twin sister and only one has cancer, unknown what kind  . Hypertension Brother   . Cancer      family hx  . Heart attack Neg Hx   . Stroke Neg Hx    Current Outpatient Prescriptions  Medication Sig Dispense Refill  . albuterol (PROAIR HFA) 108 (90 Base) MCG/ACT inhaler Inhale 2 puffs into the lungs every 4 (four) hours as needed for wheezing or shortness of breath.    . ALPRAZolam (XANAX) 0.25 MG tablet Take 1 tablet (0.25 mg total) by mouth at bedtime as needed for anxiety. F/u with PCP for refills. 30 tablet 0  . amiodarone (PACERONE) 200 MG tablet  Take 200 mg by mouth daily.    [name]. aspirin EC 81 MG tablet Take 81 mg by mouth daily.    [name]. atorvastatin (LIPITOR) 40 MG tablet Take 40 mg by mouth daily.    . bisoprolol (ZEBETA) 5 MG tablet Take 0.5 tablets (2.5 mg total) by mouth at bedtime. 15 tablet 11  . clopidogrel (PLAVIX) 75 MG tablet Take 75 mg by mouth daily.     . digoxin (LANOXIN) 0.125 MG tablet Take one tablet (0.125 mg) by mouth once every other day 30 tablet 3  . famotidine (PEPCID) 20 MG tablet Take 1 tablet (20 mg total) by mouth 2 (two) times daily. 60 tablet 6  . furosemide (LASIX) 40 MG tablet Take 1 tablet (40 mg total) by mouth See admin instructions. Take 1 tablet (40 mg) by mouth daily, may also take an additional tablet as needed for fluid buildup 30 tablet 11  . hydrOXYzine (VISTARIL) 25 MG capsule Take 25 mg by mouth at bedtime as needed (sleep).    . losartan (COZAAR) 25 MG tablet Take 1 tablet (25 mg total) by mouth daily. 30 tablet 6  . magnesium oxide (MAG-OX) 400 (241.3 Mg) MG tablet Take 1 tablet (400 mg total) by mouth daily. 30 tablet 6  . mexiletine (MEXITIL) 200 MG capsule Take 1 capsule (200 mg total) by mouth every 12 (twelve) hours. 60 capsule 3  . nicotine (NICODERM CQ - DOSED IN MG/24 HOURS) 21 mg/24hr patch Place 21 mg onto the skin daily.    . nitroGLYCERIN (NITROSTAT) 0.4 MG SL tablet Place 1 tablet (0.4 mg total) under the tongue every 5 (five) minutes as needed. For chest pain 25 tablet 11  . PARoxetine (PAXIL) 10 MG tablet Take 1 tablet (10 mg total) by mouth daily. 30 tablet 3  . potassium chloride SA (K-DUR,KLOR-CON) 20 MEQ tablet Take 1 tablet (20 mEq total) by mouth daily. 30 tablet 3  . spironolactone (ALDACTONE) 25 MG tablet Take 1 tablet (25 mg total) by mouth daily. 30 tablet 5   No current facility-administered medications for this encounter.    BP 112/70 (BP Location: Right Arm, Patient Position: Sitting, Cuff Size: Normal)   Pulse 72   Wt 202 lb 12.8 oz (92 kg)   SpO2 96%   BMI 28.28  kg/m  General: NAD Neck: No JVD, no thyromegaly or thyroid nodule.  Lungs: Clear to auscultation bilaterally with normal respiratory effort. CV: Nondisplaced PMI.  Heart regular S1/S2, no S3/S4, no murmur.  No peripheral edema.  No carotid bruit.  Normal pedal pulses.  Abdomen: Soft, nontender, no hepatosplenomegaly, no distention.  Skin: Intact without lesions or  rashes.  Neurologic: Alert and oriented x 3.  Psych: Normal affect. Extremities: No clubbing or cyanosis.  HEENT: Normal.   Assessment/Plan: 1. Chronic systolic CHF: With low cardiac output noted on during admission in 10/17.  Ischemic cardiomyopathy, had upgrade to Medtronic BiV ICD in 10/17.  However, with VT storm in 12/17, the LV lead was turned off due to concerns for pro-arrhythmia.  Volume looks ok on exam, NYHA class II-III symptoms. CPX was submaximal but suggests mild to moderate HF limitation.  - Continue Lasix 40 mg daily.   - Increase losartan to 25 mg daily.  If BP remains stable, will likely go back on Entresto in the future.   - Increase spironolactone to 25 mg daily.   - Continue digoxin, check level today.  - Continue bisoprolol 2.5 qhs.  - BMET today and repeat in 10 days.  - I am concerned that he may be nearing the need for advanced therapies.  VT storm with no new coronary disease is concerning for LV decompensation.  He has quit smoking so would potentially be a transplant candidate down the road.  LVAD would be an option. Has 3 children and lives with sister.  Will follow his clinical trajectory closely.  - Would like him to do cardiac rehab but would be hard for him to get back and forth.    2.  CAD: Stable, no chest pain. Recent LHC in 12/17 with stable coronary disease.  - Continue ASA 81, Plavix, and statin.  3. Smoking: He has quit smoking.  4. 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  Suspect this contributes to HF with loss of AV synchrony (and also was bradycardic).  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  He was BiV paced after 10/17 admission, but LV lead was turned off due to concern for pro-arrhythmia.  5. Ascending aortic aneurysm: 4.1 cm in 8/17. Needs repeat CT in 8/18.  6. Ventricular tachycardia: VT storm in 12/17.  He is now on amiodarone and mexiletine without recurrence.   - Continue amiodarone, check LFTs/TSH today.  He understands need for regular eye exam.  - No new coronary disease on cath.  - LV lead turned off as above.  - Most ominous would be electromechanical trigger with worsening LF function.  - No driving x 6 months (1/61).  - Will discuss with Dr Graciela Husbands when/if to ever re-attempt CRT. 7. PTSD: Patient is on paroxetine due to significant anxiety around shocks.  He has a hard time sleeping, I will refill his Xanax.   Followup in 1 month.   Marca Ancona 02/22/2016

## 2016-03-02 ENCOUNTER — Other Ambulatory Visit: Payer: Self-pay | Admitting: *Deleted

## 2016-03-02 ENCOUNTER — Ambulatory Visit (HOSPITAL_COMMUNITY)
Admission: RE | Admit: 2016-03-02 | Discharge: 2016-03-02 | Disposition: A | Discharge status: Home / Self Care | Payer: Medicaid Other | Source: Ambulatory Visit | Attending: Internal Medicine | Admitting: Internal Medicine

## 2016-03-02 DIAGNOSIS — I5022 Chronic systolic (congestive) heart failure: Secondary | ICD-10-CM | POA: Insufficient documentation

## 2016-03-02 LAB — BASIC METABOLIC PANEL
Anion gap: 6 (ref 5–15)
BUN: 24 mg/dL — AB (ref 6–20)
CALCIUM: 9.4 mg/dL (ref 8.9–10.3)
CO2: 28 mmol/L (ref 22–32)
Chloride: 103 mmol/L (ref 101–111)
Creatinine, Ser: 1.36 mg/dL — ABNORMAL HIGH (ref 0.61–1.24)
GFR calc Af Amer: >60 mL/min (ref >60)
GFR, EST NON AFRICAN AMERICAN: 54 mL/min — AB (ref >60)
GLUCOSE: 111 mg/dL — AB (ref 65–99)
Potassium: 4.7 mmol/L (ref 3.5–5.1)
Sodium: 137 mmol/L (ref 135–145)

## 2016-03-03 MED ORDER — ATORVASTATIN CALCIUM 40 MG PO TABS: 40.0000 mg | ORAL_TABLET | Freq: Every day | ORAL | 5 refills | Status: DC

## 2016-03-03 MED ORDER — CLOPIDOGREL BISULFATE 75 MG PO TABS: 75.0000 mg | ORAL_TABLET | Freq: Every day | ORAL | 5 refills | Status: DC

## 2016-03-03 NOTE — Telephone Encounter (Signed)
Rx(s) sent to pharmacy electronically.  

## 2016-03-06 ENCOUNTER — Telehealth (HOSPITAL_COMMUNITY): Payer: Self-pay | Admitting: *Deleted

## 2016-03-06 NOTE — Telephone Encounter (Signed)
Patient called in complaining of being light headed especially when first standing up he is concerned about his blood pressure which is 90/50 and wants to have some of his medications adjusted. Message sent to Dr.Mclean to advise.

## 2016-03-10 ENCOUNTER — Encounter (HOSPITAL_COMMUNITY): Payer: Self-pay

## 2016-03-10 ENCOUNTER — Encounter (HOSPITAL_COMMUNITY): Payer: Self-pay | Admitting: *Deleted

## 2016-03-10 ENCOUNTER — Emergency Department (HOSPITAL_COMMUNITY)
Admission: EM | Admit: 2016-03-10 | Discharge: 2016-03-10 | Disposition: A | Discharge status: Home / Self Care | Payer: Medicaid Other | Attending: Emergency Medicine | Admitting: Emergency Medicine

## 2016-03-10 DIAGNOSIS — Z8673 Personal history of transient ischemic attack (TIA), and cerebral infarction without residual deficits: Secondary | ICD-10-CM | POA: Insufficient documentation

## 2016-03-10 DIAGNOSIS — M5441 Lumbago with sciatica, right side: Secondary | ICD-10-CM | POA: Diagnosis not present

## 2016-03-10 DIAGNOSIS — I2510 Atherosclerotic heart disease of native coronary artery without angina pectoris: Secondary | ICD-10-CM | POA: Diagnosis not present

## 2016-03-10 DIAGNOSIS — I252 Old myocardial infarction: Secondary | ICD-10-CM | POA: Diagnosis not present

## 2016-03-10 DIAGNOSIS — Z79899 Other long term (current) drug therapy: Secondary | ICD-10-CM | POA: Insufficient documentation

## 2016-03-10 DIAGNOSIS — I5022 Chronic systolic (congestive) heart failure: Secondary | ICD-10-CM | POA: Diagnosis not present

## 2016-03-10 DIAGNOSIS — Z7982 Long term (current) use of aspirin: Secondary | ICD-10-CM | POA: Insufficient documentation

## 2016-03-10 DIAGNOSIS — Z95810 Presence of automatic (implantable) cardiac defibrillator: Secondary | ICD-10-CM | POA: Diagnosis not present

## 2016-03-10 DIAGNOSIS — I110 Hypertensive heart disease with heart failure: Secondary | ICD-10-CM | POA: Insufficient documentation

## 2016-03-10 DIAGNOSIS — Z87891 Personal history of nicotine dependence: Secondary | ICD-10-CM | POA: Insufficient documentation

## 2016-03-10 DIAGNOSIS — M545 Low back pain: Secondary | ICD-10-CM | POA: Diagnosis present

## 2016-03-10 DIAGNOSIS — M5442 Lumbago with sciatica, left side: Secondary | ICD-10-CM | POA: Insufficient documentation

## 2016-03-10 MED ORDER — HYDROMORPHONE HCL 2 MG/ML IJ SOLN
1.0000 mg | Freq: Once | INTRAMUSCULAR | Status: AC
  Administered 2016-03-10: 1 mg via INTRAMUSCULAR
  Filled 2016-03-10: qty 1

## 2016-03-10 MED ORDER — CYCLOBENZAPRINE HCL 10 MG PO TABS
10.0000 mg | ORAL_TABLET | Freq: Once | ORAL | Status: AC
  Administered 2016-03-10: 10 mg via ORAL
  Filled 2016-03-10: qty 1

## 2016-03-10 MED ORDER — CYCLOBENZAPRINE HCL 10 MG PO TABS: 10.0000 mg | ORAL_TABLET | Freq: Two times a day (BID) | ORAL | 0 refills | Status: DC | PRN

## 2016-03-10 MED ORDER — OXYCODONE-ACETAMINOPHEN 5-325 MG PO TABS: 1.0000 | ORAL_TABLET | Freq: Four times a day (QID) | ORAL | 0 refills | Status: DC | PRN

## 2016-03-10 NOTE — Discharge Instructions (Signed)
Please take Flexeril twice a day as needed for muscle spasm. Please take Percocet every 6 hours as needed for pain. He is use warm compress to the area as well as gentle stretching to help alleviate pain. Follow-up with primary care physician to 5 days as needed.  Get help right away if: You develop new bowel or bladder control problems. You have unusual weakness or numbness in your arms or legs. You develop nausea or vomiting. You develop abdominal pain. You feel faint.

## 2016-03-10 NOTE — ED Notes (Signed)
Updated patient on status, attempted to help him get more comfortable on stretcher

## 2016-03-10 NOTE — ED Triage Notes (Signed)
Patient c/o lower back pain onset last pm. Denies injury. States he has had difficulty walking. C/o a little tingling in his feet. No numbness.

## 2016-03-10 NOTE — Progress Notes (Signed)
[facility] [employer] medical record request dated 02/25/2016 mailed to CHF clinic completed. All medical records for 2017 faxed to provided # [phone] ([phone] pages total). Copy of request scanned into patient's medical record request.  Ave FilterBradley, Bronda Alfred Genevea, RN

## 2016-03-10 NOTE — ED Provider Notes (Signed)
[facility]-EMERGENCY DEPT Provider Note   CSN: [id number] Arrival date & time: 03/10/16  1206     History   Chief Complaint Chief Complaint  Patient presents with  . Back Pain    HPI [name] is a 61 y.o. male with history of AICD presents to the ED complaining of worsening low back pain starting yesterday. Patient reports back pain starting yesterday starting on the left side and now is throughout low back without trauma or any abnormal movements. He describes pain as worsening sharp, and a 10/10. Patient reports associated tingling weakness to both feet on certain movements. Patient states that he has had back pain often in the past and last time he had something similar was 5 years ago and states he usually gets on the right side only. He reports going to a chiropractor and a cousin who usually helps with his chronic back pain. Patient denies trauma, fevers, chills, chest pain, shortness of breath, abdominal pain, IV drug use, history of cancer, changes in vision, focal neurological deficit, numbness, changes in bowel movements, changes in urinary symptoms.  The history is provided by the patient. No language interpreter was used.    Past Medical History:  Diagnosis Date  . Anxiety   . Automatic implantable cardioverter-defibrillator in situ    a. Medtronic ICD.  [name] CAD (coronary artery disease)    a.  Severe LAD stenosis 2/2 acute thrombus - BMS 2009;  b. 06/01/11 Cath - LAD 20 isr, 66m (3.0x16 Veri-flex BMS), OM1 100p, EF 20-25%;  c. 07/2012 Abnl Cardiolite;  d. 08/2012 Cath/PCI: LM nl, LAD patent stents, D2 80-90 (jailed), LCX 90 p/m (4.0x24 Promus Premier DES), OM1 100, OM2 80-90p (3.0x20 Promus Premier DES), RCA patent mid stent, 40d, EF 25%.  e. 10/29/15 LHC stable disease with patent stents  . Chronic systolic CHF (congestive heart failure), NYHA class 3 (HCC) - low output state 12/12/2014  . Depression   . Dyspnea   . History of rheumatic fever 1962  . HLD (hyperlipidemia) mixed    . HTN (hypertension)   . Ischemic cardiomyopathy    a.  EF 30-35% 2010;  b.  EF 20-25% by LV gram 08/2012. c. EF 15% by echo 11/2014.  [name] Myocardial infarction 1998; 2002; ~ 2010  . Paroxysmal ventricular tachycardia (HCC)    a. VFlutter  CL 210 msec  Rx shock 04/2011  . PVC's (premature ventricular contractions)   . RBBB   . TIA (transient ischemic attack)    a. Dx 11/2014, continued on ASA/Plavix.    Patient Active Problem List   Diagnosis Date Noted  . Pulmonary edema   . VF (ventricular fibrillation) (HCC) 01/27/2016  . Encounter for intubation   . Acute respiratory failure with hypoxia (HCC)   . Hypokalemia   . DOE (dyspnea on exertion) 06/19/2015  . Acute systolic congestive heart failure, NYHA class 3 (HCC)   . Status post implantation of automatic cardioverter/defibrillator (AICD) - MDT   . H/O medication noncompliance   . Acute kidney insufficiency   . Chest pain at rest   . Depression 04/09/2015  . Acute on chronic systolic CHF (congestive heart failure) (HCC) 04/09/2015  . Erectile dysfunction 04/09/2015  . RBBB   . PVC's (premature ventricular contractions)   . Chronic systolic CHF (congestive heart failure), NYHA class 3 (HCC) - low output state 12/12/2014  . Generalized anxiety disorder 12/09/2014  . Major depressive disorder, recurrent episode (HCC) 12/09/2014  . TIA (transient ischemic attack)   .  HLD (hyperlipidemia)   . Aphasia 05/04/2014  . Unstable angina (HCC) 06/02/2011  . CAD (coronary artery disease)   . Ischemic cardiomyopathy   . Automatic implantable cardioverter-defibrillator in situ   . TOBACCO ABUSE 02/04/2010  . VENTRICULAR TACHYCARDIA 08/21/2008  . HYPERLIPIDEMIA-MIXED 06/04/2008  . ANXIETY 06/04/2008  . Essential hypertension 06/04/2008  . Cardiomyopathy, ischemic 06/04/2008  . CHEST PAIN-UNSPECIFIED 06/04/2008    Past Surgical History:  Procedure Laterality Date  . CARDIAC CATHETERIZATION N/A 10/29/2015   Procedure: Right/Left Heart  Cath and Coronary Angiography;  Surgeon: Peter M Swaziland, MD;  Location: [facility] INVASIVE CV LAB;  Service: Cardiovascular;  Laterality: N/A;  . CARDIAC DEFIBRILLATOR PLACEMENT     MDT single chamber primary prevention ICD implanted by Dr Graciela Husbands as part of MASTER study  . EP IMPLANTABLE DEVICE N/A 12/11/2015   Procedure: BiV Upgrade;  Surgeon: [name], MD;  Location: [facility] INVASIVE CV LAB;  Service: Cardiovascular;  Laterality: N/A;  . EP IMPLANTABLE DEVICE N/A 12/11/2015   Procedure: Pocket Revision/Relocation;  Surgeon: [name], MD;  Location: [facility] INVASIVE CV LAB;  Service: Cardiovascular;  Laterality: N/A;  . EP IMPLANTABLE DEVICE N/A 12/11/2015   Procedure: Lead Revision/Repair;  Surgeon: [name], MD;  Location: [facility] INVASIVE CV LAB;  Service: Cardiovascular;  Laterality: N/A;  . HERNIA REPAIR     "w/gallbladder OR" (08/29/2012)  . LAPAROSCOPIC CHOLECYSTECTOMY    . LEFT HEART CATHETERIZATION WITH CORONARY ANGIOGRAM N/A 06/08/2011   Procedure: LEFT HEART CATHETERIZATION WITH CORONARY ANGIOGRAM;  Surgeon: Dolores Patty, MD;  Location: [facility] CATH LAB;  Service: Cardiovascular;  Laterality: N/A;  . PERCUTANEOUS CORONARY STENT INTERVENTION (PCI-S) N/A 06/01/2011   Procedure: PERCUTANEOUS CORONARY STENT INTERVENTION (PCI-S);  Surgeon: Peter M Swaziland, MD;  Location: [facility] CATH LAB;  Service: Cardiovascular;  Laterality: N/A;  . PERCUTANEOUS CORONARY STENT INTERVENTION (PCI-S) N/A 08/29/2012   Procedure: PERCUTANEOUS CORONARY STENT INTERVENTION (PCI-S);  Surgeon: Peter M Swaziland, MD;  Location: [facility] CATH LAB;  Service: Cardiovascular;  Laterality: N/A;  . PERIPHERAL VASCULAR CATHETERIZATION  12/11/2015   Procedure: Upper Extremity Venography;  Surgeon: [name], MD;  Location: [facility] INVASIVE CV LAB;  Service: Cardiovascular;;       Home Medications    Prior to Admission medications   Medication Sig Start Date End Date Taking? Authorizing Provider  albuterol (PROAIR HFA) 108 (90 Base) MCG/ACT  inhaler Inhale 2 puffs into the lungs every 4 (four) hours as needed for wheezing or shortness of breath.   Yes Historical Provider, MD  ALPRAZolam (XANAX) 0.25 MG tablet Take 1 tablet (0.25 mg total) by mouth at bedtime as needed for anxiety. F/u with PCP for refills. 02/21/16  Yes Laurey Morale, MD  amiodarone (PACERONE) 200 MG tablet Take 200 mg by mouth daily.   Yes Historical Provider, MD  aspirin EC 81 MG tablet Take 81 mg by mouth daily.   Yes Historical Provider, MD  atorvastatin (LIPITOR) 40 MG tablet Take 1 tablet (40 mg total) by mouth daily. 03/03/16  Yes [name], MD  bisoprolol (ZEBETA) 5 MG tablet Take 0.5 tablets (2.5 mg total) by mouth at bedtime. 02/13/16  Yes [name], MD  clopidogrel (PLAVIX) 75 MG tablet Take 1 tablet (75 mg total) by mouth daily. 03/03/16  Yes [name], MD  digoxin (LANOXIN) 0.125 MG tablet Take one tablet (0.125 mg) by mouth once every other day 02/11/16  Yes [name], MD  famotidine (PEPCID) 20 MG tablet Take 1  tablet (20 mg total) by mouth 2 (two) times daily. 02/02/16  Yes Bhavinkumar Bhagat, PA  furosemide (LASIX) 40 MG tablet Take 1 tablet (40 mg total) by mouth See admin instructions. Take 1 tablet (40 mg) by mouth daily, may also take an additional tablet as needed for fluid buildup 02/13/16  Yes [name], MD  hydrOXYzine (VISTARIL) 25 MG capsule Take 25 mg by mouth at bedtime as needed (sleep).   Yes Historical Provider, MD  losartan (COZAAR) 25 MG tablet Take 1 tablet (25 mg total) by mouth daily. 02/21/16  Yes Laurey Morale, MD  magnesium oxide (MAG-OX) 400 (241.3 Mg) MG tablet Take 1 tablet (400 mg total) by mouth daily. 02/02/16  Yes Bhavinkumar Bhagat, PA  mexiletine (MEXITIL) 200 MG capsule Take 1 capsule (200 mg total) by mouth every 12 (twelve) hours. 02/02/16  Yes Bhavinkumar Bhagat, PA  nitroGLYCERIN (NITROSTAT) 0.4 MG SL tablet Place 1 tablet (0.4 mg total) under the tongue every 5 (five) minutes as needed. For  chest pain 03/29/15  Yes [name], MD  PARoxetine (PAXIL) 10 MG tablet Take 1 tablet (10 mg total) by mouth daily. 02/02/16  Yes Bhavinkumar Bhagat, PA  potassium chloride SA (K-DUR,KLOR-CON) 20 MEQ tablet Take 1 tablet (20 mEq total) by mouth daily. 02/02/16  Yes Bhavinkumar Bhagat, PA  cyclobenzaprine (FLEXERIL) 10 MG tablet Take 1 tablet (10 mg total) by mouth 2 (two) times daily as needed for muscle spasms. 03/10/16   Domnick Chervenak Manuel [place], [place]  nicotine (NICODERM CQ - DOSED IN MG/24 HOURS) 21 mg/24hr patch Place 21 mg onto the skin daily.    Historical Provider, MD  oxyCODONE-acetaminophen (PERCOCET/ROXICET) 5-325 MG tablet Take 1-2 tablets by mouth every 6 (six) hours as needed for severe pain. 03/10/16   Arno Cullers Manuel [place], [place]  spironolactone (ALDACTONE) 25 MG tablet Take 1 tablet (25 mg total) by mouth daily. Patient not taking: Reported on 03/10/2016 02/21/16   Laurey Morale, MD    Family History Family History  Problem Relation Age of Onset  . Cancer Father   . Cancer Sister     twin sister and only one has cancer, unknown what kind  . Hypertension Brother   . Cancer      family hx  . Heart attack Neg Hx   . Stroke Neg Hx     Social History Social History  Substance Use Topics  . Smoking status: Former Smoker    Packs/day: 0.50    Years: 44.00    Types: Cigarettes    Quit date: 11/20/2015  . Smokeless tobacco: Never Used  . Alcohol use No     Comment: 08/29/2012 "6-8 beers once/wk". Has not had alcohol in the last 6-8 months     Allergies   Patient has no known allergies.   Review of Systems Review of Systems  Constitutional: Negative for chills and fever.  HENT: Negative for trouble swallowing.   Eyes: Negative for photophobia and visual disturbance.  Respiratory: Negative for shortness of breath and wheezing.   Cardiovascular: Negative for chest pain.  Gastrointestinal: Negative for abdominal pain, diarrhea, nausea and vomiting.  Genitourinary:  Negative for difficulty urinating and dysuria.  Musculoskeletal: Positive for back pain. Negative for neck pain and neck stiffness.  Skin: Negative for rash and wound.  Neurological: Negative for numbness.       Tingling down both legs bilaterally on certain movements.      Physical Exam Updated Vital Signs BP 129/73 (BP Location: Left  Arm)   Pulse 77   Temp 98 F (36.7 C) (Oral)   Resp 15   Ht 5\' 11"  (1.803 m)   Wt 90.7 kg   SpO2 97%   BMI 27.89 kg/m   Physical Exam  Constitutional: He is oriented to person, place, and time. He appears well-developed and well-nourished.  HENT:  Head: Normocephalic and atraumatic.  Nose: Nose normal.  Mouth/Throat: Oropharynx is clear and moist.  Eyes: EOM are normal. Pupils are equal, round, and reactive to light.  Neck: Normal range of motion.  Cardiovascular: Normal rate, normal heart sounds and intact distal pulses.   Intact distal pulses.  AICD placed on left anterior chest wall.   Pulmonary/Chest: Effort normal and breath sounds normal. No respiratory distress.  Abdominal: Soft. He exhibits no pulsatile midline mass. There is no tenderness. There is no rebound, no guarding, no tenderness at McBurney's point and negative Murphy's sign.  Musculoskeletal: Normal range of motion. He exhibits tenderness (Low back ). He exhibits no edema or deformity.  Normal cervical, thoracic or midline tenderness or paraspinal tenderness. Lumbar tenderness and left and right paraspinal muscles tender. No redness, swelling, or stepdown deformity noted  Neurological: He is alert and oriented to person, place, and time.  Cranial Nerves:  III,IV, VI: ptosis not present, extra-ocular movements intact bilaterally, direct and consensual pupillary light reflexes intact bilaterally V: facial sensation, jaw opening, and bite strength equal bilaterally VII: eyebrow raise, eyelid close, smile, frown, pucker equal bilaterally VIII: hearing grossly normal bilaterally    IX,X: palate elevation and swallowing intact XI: bilateral shoulder shrug and lateral head rotation equal and strong XII: midline tongue extension  Cerebellar tests negative.  Sensory intact.  Muscle strength 5/5 Patient able to stand with difficulty secondary to pain.    Skin: Skin is warm.  Nursing note and vitals reviewed.    ED Treatments / Results  Labs (all labs ordered are listed, but only abnormal results are displayed) Labs Reviewed - No data to display  EKG  EKG Interpretation None       Radiology No results found.  Procedures Procedures (including critical care time)  Medications Ordered in ED Medications  HYDROmorphone (DILAUDID) injection 1 mg (1 mg Intramuscular Given 03/10/16 1419)  cyclobenzaprine (FLEXERIL) tablet 10 mg (10 mg Oral Given 03/10/16 1452)     Initial Impression / Assessment and Plan / ED Course  I have reviewed the triage vital signs and the nursing notes.  Pertinent labs & imaging results that were available during my care of the patient were reviewed by me and considered in my medical decision making (see chart for details).  Clinical Course   Patient is a 61 yo male with a hx of back pain who presents to the ED with worsening back pain. No neurological deficits appreciated. Patient is ambulatory. No warning symptoms of back pain including: fecal incontinence, urinary retention or overflow incontinence, night sweats, waking from sleep with back pain, unexplained fevers or weight loss, h/o cancer, IVDU, recent trauma. No concern for cauda equina, epidural abscess, or other serious cause of back pain. Pain treated in Ed. patient feeling better and agreeable to discharge. Conservative measures such as rest, ice/heat and pain medicine indicated with PCP follow-up if no improvement with conservative management. Return precautions given for any new or worsening symptoms. I have reviewed the [place] Controlled Substance Reporting System.    Final Clinical Impressions(s) / ED Diagnoses   Final diagnoses:  Acute bilateral low back pain with  bilateral sciatica    New Prescriptions Discharge Medication List as of 03/10/2016  3:08 PM    START taking these medications   Details  cyclobenzaprine (FLEXERIL) 10 MG tablet Take 1 tablet (10 mg total) by mouth 2 (two) times daily as needed for muscle spasms., Starting Tue 03/10/2016, Print    oxyCODONE-acetaminophen (PERCOCET/ROXICET) 5-325 MG tablet Take 1-2 tablets by mouth every 6 (six) hours as needed for severe pain., Starting Tue 03/10/2016, Print         [address] Manuel [place], [place] 03/10/16 1542    Derwood KaplanAnkit Nanavati, MD 03/11/16 [id number]

## 2016-03-12 ENCOUNTER — Encounter: Payer: Self-pay | Admitting: Internal Medicine

## 2016-03-15 ENCOUNTER — Telehealth: Payer: Self-pay | Admitting: Cardiology

## 2016-03-15 ENCOUNTER — Other Ambulatory Visit: Payer: Self-pay | Admitting: Physician Assistant

## 2016-03-15 NOTE — Telephone Encounter (Signed)
Pt called this afternoon stating that he had developed increased dyspnea over the past 2 days. Reports weight is up 202>206 this morning. Has taken his daily lasix 40mg  at 8am. Denies any chest pain, or anginal symptoms. Reports increased lower extremity edema. Instructed to take an extra lasix now and see if this helps with edema and breathing. Instructed if no improvement or develops anginal symptoms needs to seek medical treatment. Pt voiced understanding.   Laverda PageLindsay Roberts NP-C

## 2016-03-17 ENCOUNTER — Other Ambulatory Visit: Payer: Self-pay

## 2016-03-17 ENCOUNTER — Emergency Department (HOSPITAL_COMMUNITY): Payer: Medicaid Other

## 2016-03-17 ENCOUNTER — Encounter (HOSPITAL_COMMUNITY): Payer: Self-pay | Admitting: Emergency Medicine

## 2016-03-17 ENCOUNTER — Inpatient Hospital Stay (HOSPITAL_COMMUNITY)
Admission: EM | Admit: 2016-03-17 | Discharge: 2016-03-31 | DRG: 224 | Disposition: A | Discharge status: Home Health | Payer: Medicaid Other | Attending: Cardiology | Admitting: Cardiology

## 2016-03-17 ENCOUNTER — Inpatient Hospital Stay (HOSPITAL_COMMUNITY): Payer: Medicaid Other

## 2016-03-17 DIAGNOSIS — Z959 Presence of cardiac and vascular implant and graft, unspecified: Secondary | ICD-10-CM

## 2016-03-17 DIAGNOSIS — I712 Thoracic aortic aneurysm, without rupture: Secondary | ICD-10-CM

## 2016-03-17 DIAGNOSIS — I110 Hypertensive heart disease with heart failure: Secondary | ICD-10-CM | POA: Diagnosis present

## 2016-03-17 DIAGNOSIS — Z515 Encounter for palliative care: Secondary | ICD-10-CM | POA: Diagnosis not present

## 2016-03-17 DIAGNOSIS — R309 Painful micturition, unspecified: Secondary | ICD-10-CM | POA: Diagnosis not present

## 2016-03-17 DIAGNOSIS — Z8673 Personal history of transient ischemic attack (TIA), and cerebral infarction without residual deficits: Secondary | ICD-10-CM | POA: Diagnosis not present

## 2016-03-17 DIAGNOSIS — T827XXS Infection and inflammatory reaction due to other cardiac and vascular devices, implants and grafts, sequela: Secondary | ICD-10-CM

## 2016-03-17 DIAGNOSIS — M5440 Lumbago with sciatica, unspecified side: Secondary | ICD-10-CM | POA: Diagnosis not present

## 2016-03-17 DIAGNOSIS — B957 Other staphylococcus as the cause of diseases classified elsewhere: Secondary | ICD-10-CM | POA: Diagnosis not present

## 2016-03-17 DIAGNOSIS — I5022 Chronic systolic (congestive) heart failure: Secondary | ICD-10-CM | POA: Diagnosis not present

## 2016-03-17 DIAGNOSIS — I472 Ventricular tachycardia: Secondary | ICD-10-CM | POA: Diagnosis not present

## 2016-03-17 DIAGNOSIS — N179 Acute kidney failure, unspecified: Secondary | ICD-10-CM | POA: Diagnosis not present

## 2016-03-17 DIAGNOSIS — Z01810 Encounter for preprocedural cardiovascular examination: Secondary | ICD-10-CM | POA: Diagnosis not present

## 2016-03-17 DIAGNOSIS — F4310 Post-traumatic stress disorder, unspecified: Secondary | ICD-10-CM | POA: Diagnosis present

## 2016-03-17 DIAGNOSIS — Z955 Presence of coronary angioplasty implant and graft: Secondary | ICD-10-CM

## 2016-03-17 DIAGNOSIS — E782 Mixed hyperlipidemia: Secondary | ICD-10-CM | POA: Diagnosis present

## 2016-03-17 DIAGNOSIS — Y831 Surgical operation with implant of artificial internal device as the cause of abnormal reaction of the patient, or of later complication, without mention of misadventure at the time of the procedure: Secondary | ICD-10-CM | POA: Diagnosis present

## 2016-03-17 DIAGNOSIS — M549 Dorsalgia, unspecified: Secondary | ICD-10-CM

## 2016-03-17 DIAGNOSIS — T827XXD Infection and inflammatory reaction due to other cardiac and vascular devices, implants and grafts, subsequent encounter: Secondary | ICD-10-CM | POA: Diagnosis not present

## 2016-03-17 DIAGNOSIS — F329 Major depressive disorder, single episode, unspecified: Secondary | ICD-10-CM | POA: Diagnosis present

## 2016-03-17 DIAGNOSIS — I951 Orthostatic hypotension: Secondary | ICD-10-CM | POA: Diagnosis not present

## 2016-03-17 DIAGNOSIS — R42 Dizziness and giddiness: Secondary | ICD-10-CM

## 2016-03-17 DIAGNOSIS — G8929 Other chronic pain: Secondary | ICD-10-CM | POA: Diagnosis present

## 2016-03-17 DIAGNOSIS — I340 Nonrheumatic mitral (valve) insufficiency: Secondary | ICD-10-CM | POA: Diagnosis present

## 2016-03-17 DIAGNOSIS — I2510 Atherosclerotic heart disease of native coronary artery without angina pectoris: Secondary | ICD-10-CM | POA: Diagnosis present

## 2016-03-17 DIAGNOSIS — I255 Ischemic cardiomyopathy: Secondary | ICD-10-CM | POA: Diagnosis present

## 2016-03-17 DIAGNOSIS — I442 Atrioventricular block, complete: Secondary | ICD-10-CM | POA: Diagnosis present

## 2016-03-17 DIAGNOSIS — I509 Heart failure, unspecified: Secondary | ICD-10-CM | POA: Diagnosis not present

## 2016-03-17 DIAGNOSIS — T827XXA Infection and inflammatory reaction due to other cardiac and vascular devices, implants and grafts, initial encounter: Principal | ICD-10-CM | POA: Diagnosis present

## 2016-03-17 DIAGNOSIS — F411 Generalized anxiety disorder: Secondary | ICD-10-CM | POA: Diagnosis not present

## 2016-03-17 DIAGNOSIS — I5023 Acute on chronic systolic (congestive) heart failure: Secondary | ICD-10-CM

## 2016-03-17 DIAGNOSIS — J40 Bronchitis, not specified as acute or chronic: Secondary | ICD-10-CM | POA: Diagnosis present

## 2016-03-17 DIAGNOSIS — Y712 Prosthetic and other implants, materials and accessory cardiovascular devices associated with adverse incidents: Secondary | ICD-10-CM | POA: Diagnosis not present

## 2016-03-17 DIAGNOSIS — I248 Other forms of acute ischemic heart disease: Secondary | ICD-10-CM | POA: Diagnosis present

## 2016-03-17 DIAGNOSIS — R0602 Shortness of breath: Secondary | ICD-10-CM | POA: Diagnosis present

## 2016-03-17 DIAGNOSIS — I252 Old myocardial infarction: Secondary | ICD-10-CM

## 2016-03-17 DIAGNOSIS — M545 Low back pain: Secondary | ICD-10-CM | POA: Diagnosis not present

## 2016-03-17 DIAGNOSIS — Z7982 Long term (current) use of aspirin: Secondary | ICD-10-CM | POA: Diagnosis not present

## 2016-03-17 DIAGNOSIS — R7881 Bacteremia: Secondary | ICD-10-CM | POA: Diagnosis present

## 2016-03-17 DIAGNOSIS — B958 Unspecified staphylococcus as the cause of diseases classified elsewhere: Secondary | ICD-10-CM | POA: Diagnosis present

## 2016-03-17 DIAGNOSIS — Z95810 Presence of automatic (implantable) cardiac defibrillator: Secondary | ICD-10-CM

## 2016-03-17 DIAGNOSIS — I351 Nonrheumatic aortic (valve) insufficiency: Secondary | ICD-10-CM | POA: Diagnosis not present

## 2016-03-17 DIAGNOSIS — Z87891 Personal history of nicotine dependence: Secondary | ICD-10-CM

## 2016-03-17 DIAGNOSIS — M5136 Other intervertebral disc degeneration, lumbar region: Secondary | ICD-10-CM | POA: Diagnosis present

## 2016-03-17 DIAGNOSIS — Z7902 Long term (current) use of antithrombotics/antiplatelets: Secondary | ICD-10-CM

## 2016-03-17 DIAGNOSIS — Z79899 Other long term (current) drug therapy: Secondary | ICD-10-CM | POA: Diagnosis not present

## 2016-03-17 LAB — COMPREHENSIVE METABOLIC PANEL
ALBUMIN: 3.6 g/dL (ref 3.5–5.0)
ALT: 17 U/L (ref 17–63)
ANION GAP: 8 (ref 5–15)
AST: 17 U/L (ref 15–41)
Alkaline Phosphatase: 47 U/L (ref 38–126)
BUN: 27 mg/dL — ABNORMAL HIGH (ref 6–20)
CALCIUM: 9.2 mg/dL (ref 8.9–10.3)
CHLORIDE: 101 mmol/L (ref 101–111)
CO2: 26 mmol/L (ref 22–32)
Creatinine, Ser: 1.43 mg/dL — ABNORMAL HIGH (ref 0.61–1.24)
GFR calc non Af Amer: 51 mL/min — ABNORMAL LOW (ref >60)
GFR, EST AFRICAN AMERICAN: 59 mL/min — AB (ref >60)
Glucose, Bld: 96 mg/dL (ref 65–99)
POTASSIUM: 4.4 mmol/L (ref 3.5–5.1)
Sodium: 135 mmol/L (ref 135–145)
Total Bilirubin: 0.8 mg/dL (ref 0.3–1.2)
Total Protein: 6.9 g/dL (ref 6.5–8.1)

## 2016-03-17 LAB — COOXEMETRY PANEL
CARBOXYHEMOGLOBIN: 1.3 % (ref 0.5–1.5)
Carboxyhemoglobin: 1.8 % — ABNORMAL HIGH (ref 0.5–1.5)
METHEMOGLOBIN: 0.9 % (ref 0.0–1.5)
Methemoglobin: 1.3 % (ref 0.0–1.5)
O2 SAT: 30.6 %
O2 SAT: 63.9 %
TOTAL HEMOGLOBIN: 9.7 g/dL — AB (ref 12.0–16.0)
Total hemoglobin: 11.1 g/dL — ABNORMAL LOW (ref 12.0–16.0)

## 2016-03-17 LAB — CBC WITH DIFFERENTIAL/PLATELET
BASOS PCT: 0 %
Basophils Absolute: 0 10*3/uL (ref 0.0–0.1)
EOS ABS: 0.2 10*3/uL (ref 0.0–0.7)
EOS PCT: 2 %
HCT: 34.1 % — ABNORMAL LOW (ref 39.0–52.0)
Hemoglobin: 11.2 g/dL — ABNORMAL LOW (ref 13.0–17.0)
LYMPHS ABS: 1.6 10*3/uL (ref 0.7–4.0)
Lymphocytes Relative: 15 %
MCH: 30.9 pg (ref 26.0–34.0)
MCHC: 32.8 g/dL (ref 30.0–36.0)
MCV: 94.2 fL (ref 78.0–100.0)
MONOS PCT: 6 %
Monocytes Absolute: 0.6 10*3/uL (ref 0.1–1.0)
Neutro Abs: 8.3 10*3/uL — ABNORMAL HIGH (ref 1.7–7.7)
Neutrophils Relative %: 77 %
PLATELETS: 260 10*3/uL (ref 150–400)
RBC: 3.62 MIL/uL — ABNORMAL LOW (ref 4.22–5.81)
RDW: 15.7 % — AB (ref 11.5–15.5)
WBC: 10.8 10*3/uL — ABNORMAL HIGH (ref 4.0–10.5)

## 2016-03-17 LAB — I-STAT TROPONIN, ED
TROPONIN I, POC: 0 ng/mL (ref 0.00–0.08)
TROPONIN I, POC: 0.01 ng/mL (ref 0.00–0.08)

## 2016-03-17 LAB — MRSA PCR SCREENING: MRSA BY PCR: NEGATIVE

## 2016-03-17 LAB — LIPASE, BLOOD: Lipase: 14 U/L (ref 11–51)

## 2016-03-17 LAB — DIGOXIN LEVEL: DIGOXIN LVL: 0.2 ng/mL — AB (ref 0.8–2.0)

## 2016-03-17 LAB — BRAIN NATRIURETIC PEPTIDE: B NATRIURETIC PEPTIDE 5: 894.5 pg/mL — AB (ref 0.0–100.0)

## 2016-03-17 MED ORDER — FUROSEMIDE 10 MG/ML IJ SOLN
40.0000 mg | Freq: Once | INTRAMUSCULAR | Status: AC
  Administered 2016-03-17: 40 mg via INTRAVENOUS
  Filled 2016-03-17: qty 4

## 2016-03-17 MED ORDER — SODIUM CHLORIDE 0.9 % IV BOLUS (SEPSIS)
250.0000 mL | Freq: Once | INTRAVENOUS | Status: AC
  Administered 2016-03-17: 250 mL via INTRAVENOUS

## 2016-03-17 MED ORDER — LOSARTAN POTASSIUM 25 MG PO TABS
25.0000 mg | ORAL_TABLET | Freq: Every day | ORAL | Status: DC
  Administered 2016-03-17 – 2016-03-24 (×8): 25 mg via ORAL
  Filled 2016-03-17 (×8): qty 1

## 2016-03-17 MED ORDER — CLOPIDOGREL BISULFATE 75 MG PO TABS
75.0000 mg | ORAL_TABLET | Freq: Every day | ORAL | Status: DC
  Administered 2016-03-17 – 2016-03-31 (×15): 75 mg via ORAL
  Filled 2016-03-17 (×15): qty 1

## 2016-03-17 MED ORDER — NICOTINE 21 MG/24HR TD PT24
21.0000 mg | MEDICATED_PATCH | Freq: Every day | TRANSDERMAL | Status: DC
  Administered 2016-03-18 – 2016-03-28 (×2): 21 mg via TRANSDERMAL
  Filled 2016-03-17 (×9): qty 1

## 2016-03-17 MED ORDER — ALBUTEROL SULFATE HFA 108 (90 BASE) MCG/ACT IN AERS: 2.0000 | INHALATION_SPRAY | RESPIRATORY_TRACT | Status: DC | PRN

## 2016-03-17 MED ORDER — SODIUM CHLORIDE 0.9% FLUSH: 3.0000 mL | INTRAVENOUS | Status: DC | PRN

## 2016-03-17 MED ORDER — ENOXAPARIN SODIUM 40 MG/0.4ML ~~LOC~~ SOLN
40.0000 mg | SUBCUTANEOUS | Status: DC
  Administered 2016-03-17 – 2016-03-19 (×3): 40 mg via SUBCUTANEOUS
  Filled 2016-03-17 (×3): qty 0.4

## 2016-03-17 MED ORDER — ALBUTEROL SULFATE (2.5 MG/3ML) 0.083% IN NEBU: 2.5000 mg | INHALATION_SOLUTION | RESPIRATORY_TRACT | Status: DC | PRN

## 2016-03-17 MED ORDER — MORPHINE SULFATE (PF) 4 MG/ML IV SOLN
4.0000 mg | Freq: Once | INTRAVENOUS | Status: AC
  Administered 2016-03-17: 4 mg via INTRAVENOUS
  Filled 2016-03-17: qty 1

## 2016-03-17 MED ORDER — SODIUM CHLORIDE 0.9% FLUSH
10.0000 mL | INTRAVENOUS | Status: DC | PRN
  Administered 2016-03-22 – 2016-03-31 (×2): 10 mL
  Filled 2016-03-17 (×2): qty 40

## 2016-03-17 MED ORDER — MILRINONE LACTATE IN DEXTROSE 20-5 MG/100ML-% IV SOLN
0.1250 ug/kg/min | INTRAVENOUS | Status: AC
Start: 2016-03-17 — End: 2016-03-19
  Administered 2016-03-17 – 2016-03-18 (×2): 0.25 ug/kg/min via INTRAVENOUS
  Administered 2016-03-19: 0.125 ug/kg/min via INTRAVENOUS
  Filled 2016-03-17 (×4): qty 100

## 2016-03-17 MED ORDER — AMIODARONE HCL 200 MG PO TABS
200.0000 mg | ORAL_TABLET | Freq: Every day | ORAL | Status: DC
  Administered 2016-03-17 – 2016-03-31 (×15): 200 mg via ORAL
  Filled 2016-03-17 (×15): qty 1

## 2016-03-17 MED ORDER — NITROGLYCERIN 0.4 MG SL SUBL: 0.4000 mg | SUBLINGUAL_TABLET | SUBLINGUAL | Status: DC | PRN

## 2016-03-17 MED ORDER — ONDANSETRON HCL 4 MG/2ML IJ SOLN
4.0000 mg | Freq: Once | INTRAMUSCULAR | Status: AC
  Administered 2016-03-17: 4 mg via INTRAVENOUS
  Filled 2016-03-17: qty 2

## 2016-03-17 MED ORDER — HYDROXYZINE HCL 25 MG PO TABS
25.0000 mg | ORAL_TABLET | Freq: Every evening | ORAL | Status: DC | PRN
  Administered 2016-03-17 – 2016-03-24 (×2): 25 mg via ORAL
  Filled 2016-03-17 (×3): qty 1

## 2016-03-17 MED ORDER — SODIUM CHLORIDE 0.9 % IV SOLN: 250.0000 mL | INTRAVENOUS | Status: DC | PRN

## 2016-03-17 MED ORDER — FUROSEMIDE 10 MG/ML IJ SOLN
80.0000 mg | Freq: Two times a day (BID) | INTRAMUSCULAR | Status: DC
  Administered 2016-03-17: 80 mg via INTRAVENOUS
  Filled 2016-03-17: qty 8

## 2016-03-17 MED ORDER — FAMOTIDINE 20 MG PO TABS
20.0000 mg | ORAL_TABLET | Freq: Two times a day (BID) | ORAL | Status: DC
  Administered 2016-03-17 – 2016-03-31 (×28): 20 mg via ORAL
  Filled 2016-03-17 (×28): qty 1

## 2016-03-17 MED ORDER — ALPRAZOLAM 0.25 MG PO TABS
0.2500 mg | ORAL_TABLET | Freq: Every evening | ORAL | Status: DC | PRN
  Administered 2016-03-18: 0.25 mg via ORAL
  Filled 2016-03-17: qty 1

## 2016-03-17 MED ORDER — DIGOXIN 125 MCG PO TABS
0.1250 mg | ORAL_TABLET | ORAL | Status: DC
  Administered 2016-03-17 – 2016-03-31 (×8): 0.125 mg via ORAL
  Filled 2016-03-17 (×11): qty 1

## 2016-03-17 MED ORDER — ASPIRIN EC 81 MG PO TBEC
81.0000 mg | DELAYED_RELEASE_TABLET | Freq: Every day | ORAL | Status: DC
Start: 2016-03-17 — End: 2016-03-31
  Administered 2016-03-17 – 2016-03-31 (×16): 81 mg via ORAL
  Filled 2016-03-17 (×16): qty 1

## 2016-03-17 MED ORDER — ACETAMINOPHEN 325 MG PO TABS: 650.0000 mg | ORAL_TABLET | ORAL | Status: DC | PRN

## 2016-03-17 MED ORDER — OXYCODONE-ACETAMINOPHEN 5-325 MG PO TABS
1.0000 | ORAL_TABLET | Freq: Once | ORAL | Status: AC
  Administered 2016-03-17: 1 via ORAL
  Filled 2016-03-17: qty 1

## 2016-03-17 MED ORDER — IOPAMIDOL (ISOVUE-370) INJECTION 76%
INTRAVENOUS | Status: AC
  Administered 2016-03-17: 100 mL via INTRAVENOUS
  Filled 2016-03-17: qty 100

## 2016-03-17 MED ORDER — MAGNESIUM OXIDE 400 (241.3 MG) MG PO TABS
400.0000 mg | ORAL_TABLET | Freq: Every day | ORAL | Status: DC
  Administered 2016-03-17 – 2016-03-31 (×15): 400 mg via ORAL
  Filled 2016-03-17 (×15): qty 1

## 2016-03-17 MED ORDER — MEXILETINE HCL 200 MG PO CAPS
200.0000 mg | ORAL_CAPSULE | Freq: Two times a day (BID) | ORAL | Status: DC
  Administered 2016-03-17 – 2016-03-31 (×28): 200 mg via ORAL
  Filled 2016-03-17 (×30): qty 1

## 2016-03-17 MED ORDER — ATORVASTATIN CALCIUM 40 MG PO TABS
40.0000 mg | ORAL_TABLET | Freq: Every day | ORAL | Status: DC
  Administered 2016-03-17 – 2016-03-30 (×13): 40 mg via ORAL
  Filled 2016-03-17 (×15): qty 1

## 2016-03-17 MED ORDER — ONDANSETRON HCL 4 MG/2ML IJ SOLN: 4.0000 mg | Freq: Four times a day (QID) | INTRAMUSCULAR | Status: DC | PRN

## 2016-03-17 MED ORDER — OXYCODONE-ACETAMINOPHEN 5-325 MG PO TABS
1.0000 | ORAL_TABLET | Freq: Four times a day (QID) | ORAL | Status: DC | PRN
  Administered 2016-03-17 (×2): 1 via ORAL
  Administered 2016-03-18 – 2016-03-31 (×23): 2 via ORAL
  Filled 2016-03-17 (×13): qty 2
  Filled 2016-03-17: qty 1
  Filled 2016-03-17 (×2): qty 2
  Filled 2016-03-17: qty 1
  Filled 2016-03-17 (×11): qty 2

## 2016-03-17 MED ORDER — CYCLOBENZAPRINE HCL 10 MG PO TABS
10.0000 mg | ORAL_TABLET | Freq: Two times a day (BID) | ORAL | Status: DC | PRN
  Administered 2016-03-17 – 2016-03-30 (×8): 10 mg via ORAL
  Filled 2016-03-17 (×9): qty 1

## 2016-03-17 MED ORDER — HYDROXYZINE PAMOATE 25 MG PO CAPS
25.0000 mg | ORAL_CAPSULE | Freq: Every evening | ORAL | Status: DC | PRN
  Filled 2016-03-17 (×2): qty 1

## 2016-03-17 MED ORDER — SODIUM CHLORIDE 0.9% FLUSH
3.0000 mL | Freq: Two times a day (BID) | INTRAVENOUS | Status: DC
  Administered 2016-03-17 – 2016-03-19 (×5): 3 mL via INTRAVENOUS

## 2016-03-17 MED ORDER — PAROXETINE HCL 10 MG PO TABS
10.0000 mg | ORAL_TABLET | Freq: Every day | ORAL | Status: DC
  Administered 2016-03-17 – 2016-03-27 (×11): 10 mg via ORAL
  Filled 2016-03-17 (×11): qty 1

## 2016-03-17 MED ORDER — ASPIRIN 81 MG PO CHEW: 324.0000 mg | CHEWABLE_TABLET | Freq: Once | ORAL | Status: DC

## 2016-03-17 NOTE — Progress Notes (Signed)
Peripherally Inserted Central Catheter/Midline Placement  The IV Nurse has discussed with the patient and/or persons authorized to consent for the patient, the purpose of this procedure and the potential benefits and risks involved with this procedure.  The benefits include less needle sticks, lab draws from the catheter, and the patient may be discharged home with the catheter. Risks include, but not limited to, infection, bleeding, blood clot (thrombus formation), and puncture of an artery; nerve damage and irregular heartbeat and possibility to perform a PICC exchange if needed/ordered by physician.  Alternatives to this procedure were also discussed.  Bard Power PICC patient education guide, fact sheet on infection prevention and patient information card has been provided to patient /or left at bedside.    PICC/Midline Placement Documentation  PICC Double Lumen 03/17/16 PICC Right Basilic 45 cm (Active)  Indication for Insertion or Continuance of Line Vasoactive infusions 03/17/2016  8:30 PM  Exposed Catheter (cm) 0 cm 03/17/2016  8:30 PM  Site Assessment Clean;Dry;Intact 03/17/2016  8:30 PM  Lumen #1 Status Blood return noted;Flushed;Saline locked 03/17/2016  8:30 PM  Lumen #2 Status Blood return noted;Flushed;Saline locked 03/17/2016  8:30 PM  Dressing Type Transparent 03/17/2016  8:30 PM  Dressing Status Clean;Dry;Intact;Antimicrobial disc in place 03/17/2016  8:30 PM  Dressing Intervention New dressing 03/17/2016  8:30 PM  Dressing Change Due 03/24/16 03/17/2016  8:30 PM       Christeen Douglashomas, Abrar Koone L 03/17/2016, 8:44 PM

## 2016-03-17 NOTE — ED Provider Notes (Signed)
Complains of anterior chest pain radiated to the back onset today since yesterday accompanied by dyspnea. He is presently asymptomatic. Patient received IV Lasix here prior to my exam. After menstruation of IV Lasix patient diuresed 400 mL. Reportedly his desaturated to 87% on room air upon walking after treatment with IV Lasix. Results for orders placed or performed during the hospital encounter of 03/17/16  CBC with Differential  Result Value Ref Range   WBC 10.8 (H) 4.0 - 10.5 K/uL   RBC 3.62 (L) 4.22 - 5.81 MIL/uL   Hemoglobin 11.2 (L) 13.0 - 17.0 g/dL   HCT 21.3 (L) 08.6 - 57.8 %   MCV 94.2 78.0 - 100.0 fL   MCH 30.9 26.0 - 34.0 pg   MCHC 32.8 30.0 - 36.0 g/dL   RDW 46.9 (H) 62.9 - 52.8 %   Platelets 260 150 - 400 K/uL   Neutrophils Relative % 77 %   Neutro Abs 8.3 (H) 1.7 - 7.7 K/uL   Lymphocytes Relative 15 %   Lymphs Abs 1.6 0.7 - 4.0 K/uL   Monocytes Relative 6 %   Monocytes Absolute 0.6 0.1 - 1.0 K/uL   Eosinophils Relative 2 %   Eosinophils Absolute 0.2 0.0 - 0.7 K/uL   Basophils Relative 0 %   Basophils Absolute 0.0 0.0 - 0.1 K/uL  Comprehensive metabolic panel  Result Value Ref Range   Sodium 135 135 - 145 mmol/L   Potassium 4.4 3.5 - 5.1 mmol/L   Chloride 101 101 - 111 mmol/L   CO2 26 22 - 32 mmol/L   Glucose, Bld 96 65 - 99 mg/dL   BUN 27 (H) 6 - 20 mg/dL   Creatinine, Ser 4.13 (H) 0.61 - 1.24 mg/dL   Calcium 9.2 8.9 - 24.4 mg/dL   Total Protein 6.9 6.5 - 8.1 g/dL   Albumin 3.6 3.5 - 5.0 g/dL   AST 17 15 - 41 U/L   ALT 17 17 - 63 U/L   Alkaline Phosphatase 47 38 - 126 U/L   Total Bilirubin 0.8 0.3 - 1.2 mg/dL   GFR calc non Af Amer 51 (L) >60 mL/min   GFR calc Af Amer 59 (L) >60 mL/min   Anion gap 8 5 - 15  Lipase, blood  Result Value Ref Range   Lipase 14 11 - 51 U/L  Brain natriuretic peptide  Result Value Ref Range   B Natriuretic Peptide 894.5 (H) 0.0 - 100.0 pg/mL  Digoxin level  Result Value Ref Range   Digoxin Level 0.2 (L) 0.8 - 2.0 ng/mL   I-Stat Troponin, ED (not at [facility])  Result Value Ref Range   Troponin i, poc 0.00 0.00 - 0.08 ng/mL   Comment 3          I-Stat Troponin, ED (not at [facility])  Result Value Ref Range   Troponin i, poc 0.01 0.00 - 0.08 ng/mL   Comment 3           Dg Chest 2 View  Result Date: 03/17/2016 CLINICAL DATA:  Chest and lower back pain. History of 3 myocardial infarctions with stents. EXAM: CHEST  2 VIEW COMPARISON:  01/29/2016 CXR FINDINGS: Left-sided AICD device with coronary sinus, right atrial and right ventricular leads present. Cardiomegaly is identified with coronary arterial stenting. No aortic aneurysm. Chronic mild interstitial prominence without pneumonic consolidation, effusion or pneumothorax. Mild apical pleuroparenchymal thickening and scarring. No acute osseous abnormality. IMPRESSION: Cardiomegaly without acute pulmonary disease. Electronically Signed   By: Tollie Eth  M.D.   On: 03/17/2016 03:53   Ct Angio Chest/abd/pel For Dissection W And/or Wo Contrast  Result Date: 03/17/2016 CLINICAL DATA:  Right-sided chest pain EXAM: CT ANGIOGRAPHY CHEST, ABDOMEN AND PELVIS TECHNIQUE: Multidetector CT imaging through the chest, abdomen and pelvis was performed using the standard protocol during bolus administration of intravenous contrast. Multiplanar reconstructed images and MIPs were obtained and reviewed to evaluate the vascular anatomy. CONTRAST:  100 mL Isovue 370. COMPARISON:  Chest x-ray from earlier in the same day, 12/16/2015, 10/18/2015 FINDINGS: CTA CHEST FINDINGS Cardiovascular: The thoracic aorta shows mild atherosclerotic calcifications without aneurysmal dilatation or dissection. Pulmonary artery shows a normal branching pattern without evidence of pulmonary embolism. Coronary calcifications and prior coronary stenting is noted. A defibrillator is again noted. Cardiac structures are mildly enlarged but stable. Multiple chest wall venous collaterals are identified likely related to stenosis of  the left subclavian vein from the defibrillator. Mediastinum/Nodes: Thoracic inlet is within normal limits. Stable mediastinal adenopathy is noted in the right peritracheal and subcarinal region with the largest of these in the right peritracheal region measuring 16 mm in short axis which is stable from the prior exam. Scattered small hilar lymph nodes are noted also stable from the previous study. The esophagus as visualize is within normal limits. Lungs/Pleura: Mild emphysematous changes are noted. No focal infiltrate or sizable effusion is seen. Mild peribronchial thickening is noted which is stable from the prior exam likely of a chronic nature. No acute abnormality is seen. Musculoskeletal: Degenerative changes of the thoracic spine are noted. No acute bony abnormality is seen. Review of the MIP images confirms the above findings. CTA ABDOMEN AND PELVIS FINDINGS VASCULAR Aorta: The abdominal aorta demonstrates atherosclerotic calcifications without aneurysmal dilatation or evidence of dissection. Celiac: Mild atherosclerotic calcifications are noted at the origin although the celiac axis is widely patent. SMA: Mild atherosclerotic changes are noted at the origin although the superior mesenteric artery is patent. Renals: Single renal arteries are identified bilaterally with mild atherosclerotic calcifications. No focal hemodynamically significant stenosis is noted. IMA: Widely patent Iliacs: Mild atherosclerotic changes are seen without aneurysmal dilatation or dissection. Veins: Although not timed for venous evaluation no definitive abnormality is seen. Review of the MIP images confirms the above findings. NON-VASCULAR Hepatobiliary: Multiple hypodensities are again identified within the liver consistent with hepatic cysts. These are stable from previous exams. The gallbladder has been surgically removed. Pancreas: Unremarkable. No pancreatic ductal dilatation or surrounding inflammatory changes. Spleen: Normal  in size without focal abnormality. Adrenals/Urinary Tract: The adrenal glands are within normal limits. Kidneys are well visualized bilaterally without evidence of renal mass or calcification. No obstructive changes are seen. The bladder is well distended. Stomach/Bowel: Scattered diverticular changes are noted throughout the colon. The appendix is well visualized and within normal limits. No obstructive changes are seen. No inflammatory changes are identified. Lymphatic: No significant lymphadenopathy is identified. Reproductive: Prostate is unremarkable. Other: No abdominal wall hernia or abnormality. No abdominopelvic ascites. Musculoskeletal: No acute or significant osseous findings. Review of the MIP images confirms the above findings. IMPRESSION: CTA of the chest: No evidence of aortic dissection or aneurysmal dilatation. No definitive pulmonary emboli are identified. Stable mediastinal and hilar lymph nodes likely reactive in nature. Mild changes of bronchitis are noted as well. 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 CTA of the abdomen and pelvis. No aneurysmal dilatation or dissection is noted. Scattered chronic changes similar to that noted on previous exams. Electronically Signed  By: Alcide CleverMark  Lukens M.D.   On: 03/17/2016 08:33  Cardiology consulted and will evaluate patient in ED   Doug SouSam Draper Gallon, MD 03/17/16 1702

## 2016-03-17 NOTE — H&P (Signed)
Advanced Heart Failure Team History and Physical Note   Cardiology: Dr. Jens Somrenshaw Primary HF:  [name]   Reason for Admission: A/C systolic CHF   HPI:    [name] is a 61 y.o. male with hx of smoking, CAD s/p multiple PCIs, Chronic systolic CHF 2/2 ICM.   Last seen in HF clinic 02/21/16. Thought to be stable. Losartan and Spiro increased. Volume looked OK on exam. Weight 202 lbs.   He [facility] by ambulance today for worsening SOB starting yesterday. Weight up ~8 lbs at home. Pertinent labs on admission include K 4.4 , Creatinine 1.53, Troponin negative. BNP 894.5. CTA chest/ABD/pelvis with no evidence of Aortic dissection or aneurysmal dilatation. No definite PE identified.  Mild bronchitis changes noted.   Tried to take an extra lasix yesterday evening with only minimal relief.  Also c/o chest pressure over same time period. Pt having pain behind his R shoulder blade that just starting this morning.  Has had "spells" of SOB over the past week that quickly resolve.  + Orthopnea and PND. He denies SOB changing clothes or bathing. Has had normal urine output, denies decline or darkening urine.  Has had night sweats last 2 nights. Has been lightheaded with standing over the past week. No near syncope. Denies cough, fever or chills.    Review of Systems: [y] = yes, [ ]  = no   General: Weight gain [y]; Weight loss [ ] ; Anorexia [ ] ; Fatigue [ ] ; Fever [ ] ; Chills [ ] ; Weakness [ ]   Cardiac: Chest pain/pressure [y]; Resting SOB [ ] ; Exertional SOB [y]; Orthopnea [y]; Pedal Edema [ ] ; Palpitations [ ] ; Syncope [ ] ; Presyncope [ ] ; Paroxysmal nocturnal dyspnea[y]  Pulmonary: Cough [ ] ; Wheezing[ ] ; Hemoptysis[ ] ; Sputum [ ] ; Snoring [ ]   GI: Vomiting[ ] ; Dysphagia[ ] ; Melena[ ] ; Hematochezia [ ] ; Heartburn[ ] ; Abdominal pain [ ] ; Constipation [ ] ; Diarrhea [ ] ; BRBPR [ ]   GU: Hematuria[ ] ; Dysuria [ ] ; Nocturia[ ]   Vascular: Pain in legs with walking [ ] ; Pain in feet with lying  flat [ ] ; Non-healing sores [ ] ; Stroke [ ] ; TIA [ ] ; Slurred speech [ ] ;  Neuro: Headaches[ ] ; Vertigo[ ] ; Seizures[ ] ; Paresthesias[ ] ;Blurred vision [ ] ; Diplopia [ ] ; Vision changes [ ]   Ortho/Skin: Arthritis [ ] ; Joint pain [ ] ; Muscle pain [ ] ; Joint swelling [ ] ; Back Pain [ ] ; Rash [ ]   Psych: Depression[ ] ; Anxiety[ ]   Heme: Bleeding problems [ ] ; Clotting disorders [ ] ; Anemia [ ]   Endocrine: Diabetes [ ] ; Thyroid dysfunction[ ]    Home Medications Prior to Admission medications   Medication Sig Start Date End Date Taking? Authorizing Provider  albuterol (PROAIR HFA) 108 (90 Base) MCG/ACT inhaler Inhale 2 puffs into the lungs every 4 (four) hours as needed for wheezing or shortness of breath.   Yes Historical Provider, MD  ALPRAZolam (XANAX) 0.25 MG tablet Take 1 tablet (0.25 mg total) by mouth at bedtime as needed for anxiety. F/u with PCP for refills. 02/21/16  Yes Laurey Moralealton S McLean, MD  amiodarone (PACERONE) 200 MG tablet Take 1 tablet (200 mg total) by mouth daily. 03/16/16  Yes Laurey Moralealton S McLean, MD  aspirin EC 81 MG tablet Take 81 mg by mouth daily.   Yes Historical Provider, MD  atorvastatin (LIPITOR) 40 MG tablet Take 1 tablet (40 mg total) by mouth daily. 03/03/16  Yes Lewayne BuntingBrian S Crenshaw, MD  bisoprolol (ZEBETA) 5 MG  tablet Take 0.5 tablets (2.5 mg total) by mouth at bedtime. 02/13/16  Yes [name], MD  clopidogrel (PLAVIX) 75 MG tablet Take 1 tablet (75 mg total) by mouth daily. 03/03/16  Yes [name], MD  cyclobenzaprine (FLEXERIL) 10 MG tablet Take 1 tablet (10 mg total) by mouth 2 (two) times daily as needed for muscle spasms. 03/10/16  Yes [address] [place], [place]  digoxin (LANOXIN) 0.125 MG tablet Take one tablet (0.125 mg) by mouth once every other day 02/11/16  Yes [name], MD  famotidine (PEPCID) 20 MG tablet Take 1 tablet (20 mg total) by mouth 2 (two) times daily. 02/02/16  Yes Bhavinkumar Bhagat, PA  furosemide (LASIX) 40 MG tablet Take 1 tablet (40 mg  total) by mouth See admin instructions. Take 1 tablet (40 mg) by mouth daily, may also take an additional tablet as needed for fluid buildup 02/13/16  Yes [name], MD  hydrOXYzine (VISTARIL) 25 MG capsule Take 25 mg by mouth at bedtime as needed (sleep).   Yes Historical Provider, MD  losartan (COZAAR) 25 MG tablet Take 1 tablet (25 mg total) by mouth daily. 02/21/16  Yes Laurey Morale, MD  magnesium oxide (MAG-OX) 400 (241.3 Mg) MG tablet Take 1 tablet (400 mg total) by mouth daily. 02/02/16  Yes Bhavinkumar Bhagat, PA  mexiletine (MEXITIL) 200 MG capsule Take 1 capsule (200 mg total) by mouth every 12 (twelve) hours. 02/02/16  Yes Bhavinkumar Bhagat, PA  nicotine (NICODERM CQ - DOSED IN MG/24 HOURS) 21 mg/24hr patch Place 21 mg onto the skin daily.   Yes Historical Provider, MD  nitroGLYCERIN (NITROSTAT) 0.4 MG SL tablet Place 1 tablet (0.4 mg total) under the tongue every 5 (five) minutes as needed. For chest pain 03/29/15  Yes [name], MD  oxyCODONE-acetaminophen (PERCOCET/ROXICET) 5-325 MG tablet Take 1-2 tablets by mouth every 6 (six) hours as needed for severe pain. 03/10/16  Yes Francisco [address] [place], [place]  PARoxetine (PAXIL) 10 MG tablet Take 1 tablet (10 mg total) by mouth daily. 02/02/16  Yes Bhavinkumar Bhagat, PA  potassium chloride SA (K-DUR,KLOR-CON) 20 MEQ tablet Take 1 tablet (20 mEq total) by mouth daily. 02/02/16  Yes Bhavinkumar Bhagat, PA  spironolactone (ALDACTONE) 25 MG tablet Take 1 tablet (25 mg total) by mouth daily. Patient not taking: Reported on 03/10/2016 02/21/16   Laurey Morale, MD    Past Medical History: Past Medical History:  Diagnosis Date  . Anxiety   . Automatic implantable cardioverter-defibrillator in situ    a. Medtronic ICD.  [name] CAD (coronary artery disease)    a.  Severe LAD stenosis 2/2 acute thrombus - BMS 2009;  b. 06/01/11 Cath - LAD 20 isr, 59m (3.0x16 Veri-flex BMS), OM1 100p, EF 20-25%;  c. 07/2012 Abnl Cardiolite;  d. 08/2012  Cath/PCI: LM nl, LAD patent stents, D2 80-90 (jailed), LCX 90 p/m (4.0x24 Promus Premier DES), OM1 100, OM2 80-90p (3.0x20 Promus Premier DES), RCA patent mid stent, 40d, EF 25%.  e. 10/29/15 LHC stable disease with patent stents  . Chronic systolic CHF (congestive heart failure), NYHA class 3 (HCC) - low output state 12/12/2014  . Depression   . Dyspnea   . History of rheumatic fever 1962  . HLD (hyperlipidemia) mixed  . HTN (hypertension)   . Ischemic cardiomyopathy    a.  EF 30-35% 2010;  b.  EF 20-25% by LV gram 08/2012. c. EF 15% by echo 11/2014.  [name] Myocardial infarction 1998; 2002; ~  2010  . Paroxysmal ventricular tachycardia (HCC)    a. VFlutter  CL 210 msec  Rx shock 04/2011  . PVC's (premature ventricular contractions)   . RBBB   . TIA (transient ischemic attack)    a. Dx 11/2014, continued on ASA/Plavix.    Past Surgical History: Past Surgical History:  Procedure Laterality Date  . CARDIAC CATHETERIZATION N/A 10/29/2015   Procedure: Right/Left Heart Cath and Coronary Angiography;  Surgeon: Peter M Swaziland, MD;  Location: [facility] INVASIVE CV LAB;  Service: Cardiovascular;  Laterality: N/A;  . CARDIAC DEFIBRILLATOR PLACEMENT     MDT single chamber primary prevention ICD implanted by Dr Graciela Husbands as part of MASTER study  . EP IMPLANTABLE DEVICE N/A 12/11/2015   Procedure: BiV Upgrade;  Surgeon: [name], MD;  Location: [facility] INVASIVE CV LAB;  Service: Cardiovascular;  Laterality: N/A;  . EP IMPLANTABLE DEVICE N/A 12/11/2015   Procedure: Pocket Revision/Relocation;  Surgeon: [name], MD;  Location: [facility] INVASIVE CV LAB;  Service: Cardiovascular;  Laterality: N/A;  . EP IMPLANTABLE DEVICE N/A 12/11/2015   Procedure: Lead Revision/Repair;  Surgeon: [name], MD;  Location: [facility] INVASIVE CV LAB;  Service: Cardiovascular;  Laterality: N/A;  . HERNIA REPAIR     "w/gallbladder OR" (08/29/2012)  . LAPAROSCOPIC CHOLECYSTECTOMY    . LEFT HEART CATHETERIZATION WITH CORONARY ANGIOGRAM N/A  06/08/2011   Procedure: LEFT HEART CATHETERIZATION WITH CORONARY ANGIOGRAM;  Surgeon: Dolores Patty, MD;  Location: [facility] CATH LAB;  Service: Cardiovascular;  Laterality: N/A;  . PERCUTANEOUS CORONARY STENT INTERVENTION (PCI-S) N/A 06/01/2011   Procedure: PERCUTANEOUS CORONARY STENT INTERVENTION (PCI-S);  Surgeon: Peter M Swaziland, MD;  Location: [facility] CATH LAB;  Service: Cardiovascular;  Laterality: N/A;  . PERCUTANEOUS CORONARY STENT INTERVENTION (PCI-S) N/A 08/29/2012   Procedure: PERCUTANEOUS CORONARY STENT INTERVENTION (PCI-S);  Surgeon: Peter M Swaziland, MD;  Location: [facility] CATH LAB;  Service: Cardiovascular;  Laterality: N/A;  . PERIPHERAL VASCULAR CATHETERIZATION  12/11/2015   Procedure: Upper Extremity Venography;  Surgeon: [name], MD;  Location: [facility] INVASIVE CV LAB;  Service: Cardiovascular;;    Family History:  Family History  Problem Relation Age of Onset  . Cancer Father   . Cancer Sister     twin sister and only one has cancer, unknown what kind  . Hypertension Brother   . Cancer      family hx  . Heart attack Neg Hx   . Stroke Neg Hx     Social History: Social History   Social History  . Marital status: Legally Separated    Spouse name: N/A  . Number of children: N/A  . Years of education: N/A   Social History Main Topics  . Smoking status: Former Smoker    Packs/day: 0.50    Years: 44.00    Types: Cigarettes    Quit date: 11/20/2015  . Smokeless tobacco: Never Used  . Alcohol use No     Comment: 08/29/2012 "6-8 beers once/wk". Has not had alcohol in the last 6-8 months  . Drug use: No  . Sexual activity: Not Currently   Other Topics Concern  . None   Social History Narrative   Married; full time.     Allergies:  No Known Allergies  Objective:    Vital Signs:   Temp:  [97.8 F (36.6 C)] 97.8 F (36.6 C) (01/23 0229) Pulse Rate:  [58-78] 58 (01/23 0930) Resp:  [11-22] 13 (01/23 0930) BP: (85-106)/(53-74) 106/74 (01/23 0930) SpO2:  [92 %-100 %]  99 %  (01/23 0930)     Physical Exam: General:  Appears older than stated age. Fatigued.  HEENT: Normal Neck: supple. JVD 9-10 cm. Carotids 2+ bilat; no bruits. No lymphadenopathy or thyromegaly appreciated. Cor: PMI nondisplaced. Regular. No M/G/R Lungs: Bibasilar crackles Abdomen: soft, NT, +distention, no HSM. No bruits or masses. +BS  Extremities: no cyanosis, clubbing, rash. No peripheral edema. Slightly cool.  Neuro: alert & oriented x 3, cranial nerves grossly intact. moves all 4 extremities w/o difficulty. Affect pleasant.  Telemetry: A sensed V paced  Labs: Basic Metabolic Panel:  Recent Labs Lab 03/17/16 0311  NA 135  K 4.4  CL 101  CO2 26  GLUCOSE 96  BUN 27*  CREATININE 1.43*  CALCIUM 9.2    Liver Function Tests:  Recent Labs Lab 03/17/16 0311  AST 17  ALT 17  ALKPHOS 47  BILITOT 0.8  PROT 6.9  ALBUMIN 3.6    Recent Labs Lab 03/17/16 0311  LIPASE 14   No results for input(s): AMMONIA in the last 168 hours.  CBC:  Recent Labs Lab 03/17/16 0311  WBC 10.8*  NEUTROABS 8.3*  HGB 11.2*  HCT 34.1*  MCV 94.2  PLT 260    Cardiac Enzymes: No results for input(s): CKTOTAL, CKMB, CKMBINDEX, TROPONINI in the last 168 hours.  BNP: BNP (last 3 results)  Recent Labs  01/13/16 1008 01/27/16 1155 03/17/16 0311  BNP 256.0* 595.7* 894.5*    ProBNP (last 3 results) No results for input(s): PROBNP in the last 8760 hours.   CBG: No results for input(s): GLUCAP in the last 168 hours.  Coagulation Studies: No results for input(s): LABPROT, INR in the last 72 hours.  Other results: EKG: A sensed V paced 76 bpm  Imaging: Dg Chest 2 View  Result Date: 03/17/2016 CLINICAL DATA:  Chest and lower back pain. History of 3 myocardial infarctions with stents. EXAM: CHEST  2 VIEW COMPARISON:  01/29/2016 CXR FINDINGS: Left-sided AICD device with coronary sinus, right atrial and right ventricular leads present. Cardiomegaly is identified with coronary  arterial stenting. No aortic aneurysm. Chronic mild interstitial prominence without pneumonic consolidation, effusion or pneumothorax. Mild apical pleuroparenchymal thickening and scarring. No acute osseous abnormality. IMPRESSION: Cardiomegaly without acute pulmonary disease. Electronically Signed   By: Tollie Eth M.D.   On: 03/17/2016 03:53   Ct Angio Chest/abd/pel For Dissection W And/or Wo Contrast  Result Date: 03/17/2016 CLINICAL DATA:  Right-sided chest pain EXAM: CT ANGIOGRAPHY CHEST, ABDOMEN AND PELVIS TECHNIQUE: Multidetector CT imaging through the chest, abdomen and pelvis was performed using the standard protocol during bolus administration of intravenous contrast. Multiplanar reconstructed images and MIPs were obtained and reviewed to evaluate the vascular anatomy. CONTRAST:  100 mL Isovue 370. COMPARISON:  Chest x-ray from earlier in the same day, 12/16/2015, 10/18/2015 FINDINGS: CTA CHEST FINDINGS Cardiovascular: The thoracic aorta shows mild atherosclerotic calcifications without aneurysmal dilatation or dissection. Pulmonary artery shows a normal branching pattern without evidence of pulmonary embolism. Coronary calcifications and prior coronary stenting is noted. A defibrillator is again noted. Cardiac structures are mildly enlarged but stable. Multiple chest wall venous collaterals are identified likely related to stenosis of the left subclavian vein from the defibrillator. Mediastinum/Nodes: Thoracic inlet is within normal limits. Stable mediastinal adenopathy is noted in the right peritracheal and subcarinal region with the largest of these in the right peritracheal region measuring 16 mm in short axis which is stable from the prior exam. Scattered small hilar lymph nodes are noted also stable  from the previous study. The esophagus as visualize is within normal limits. Lungs/Pleura: Mild emphysematous changes are noted. No focal infiltrate or sizable effusion is seen. Mild peribronchial  thickening is noted which is stable from the prior exam likely of a chronic nature. No acute abnormality is seen. Musculoskeletal: Degenerative changes of the thoracic spine are noted. No acute bony abnormality is seen. Review of the MIP images confirms the above findings. CTA ABDOMEN AND PELVIS FINDINGS VASCULAR Aorta: The abdominal aorta demonstrates atherosclerotic calcifications without aneurysmal dilatation or evidence of dissection. Celiac: Mild atherosclerotic calcifications are noted at the origin although the celiac axis is widely patent. SMA: Mild atherosclerotic changes are noted at the origin although the superior mesenteric artery is patent. Renals: Single renal arteries are identified bilaterally with mild atherosclerotic calcifications. No focal hemodynamically significant stenosis is noted. IMA: Widely patent Iliacs: Mild atherosclerotic changes are seen without aneurysmal dilatation or dissection. Veins: Although not timed for venous evaluation no definitive abnormality is seen. Review of the MIP images confirms the above findings. NON-VASCULAR Hepatobiliary: Multiple hypodensities are again identified within the liver consistent with hepatic cysts. These are stable from previous exams. The gallbladder has been surgically removed. Pancreas: Unremarkable. No pancreatic ductal dilatation or surrounding inflammatory changes. Spleen: Normal in size without focal abnormality. Adrenals/Urinary Tract: The adrenal glands are within normal limits. Kidneys are well visualized bilaterally without evidence of renal mass or calcification. No obstructive changes are seen. The bladder is well distended. Stomach/Bowel: Scattered diverticular changes are noted throughout the colon. The appendix is well visualized and within normal limits. No obstructive changes are seen. No inflammatory changes are identified. Lymphatic: No significant lymphadenopathy is identified. Reproductive: Prostate is unremarkable. Other: No  abdominal wall hernia or abnormality. No abdominopelvic ascites. Musculoskeletal: No acute or significant osseous findings. Review of the MIP images confirms the above findings. IMPRESSION: CTA of the chest: No evidence of aortic dissection or aneurysmal dilatation. No definitive pulmonary emboli are identified. Stable mediastinal and hilar lymph nodes likely reactive in nature. Mild changes of bronchitis are noted as well. 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 CTA of the abdomen and pelvis. No aneurysmal dilatation or dissection is noted. Scattered chronic changes similar to that noted on previous exams. Electronically Signed   By: Alcide Clever M.D.   On: 03/17/2016 08:33     Assessment/Plan   1. Acute on chronic systolic CHF - Echo 11/2015 LVEF 15% 2/2 ICM - Volume status at least mildly elevated on exam. But SOB out of proportion to exam. + orthopnea, fatigue, and SOB.  ? Recurrent low output, especially with soft pressures. - OK diuresis with IV lasix so far, but not feeling any better.  - Would hold BB for now.  - Continue spiro 25 mg daily for now.  - Can hold losartan as needed. Pressure has improved since admission.   2. CAD - Stable. Recent LHC in 12/17 with stable coronary disease - Chest pressure atypical. Troponin negative x 2 thus far.  Would repeat at least once more 3. Ascending aortic aneurysm - 4.1 cm 8/17. No evidence of dissection on CTA today.  4. VT storm in 12/17 - Continue amiodarone and Mexiletine - Interrogate device to be sure no arrythmias at fault for decompensation.   Will admit to stepdown and place PICC for possible recurrent low output.   Length of Stay: 0   Luane School 03/17/2016, 10:08 AM  Advanced Heart Failure Team Pager [phone] (  M-F; 7a - 4p)  Please contact [facility] for night-coverage after hours (4p -7a ) and weekends on [email or web address]   Patient seen and examined  with Otilio Saber, PA-C. We discussed all aspects of the encounter. I agree with the assessment and plan as stated above.   He has mild to moderate volume overload but I suspect he has recurrent low output. Will admit to SDU and place PICC line to check CVP and co-ox. Hold b-blocker. Watch renal function with low output and CTA today. We discussed advanced therapies. Echo reviewed and RV mildly HK.   Emmalee Solivan,MD 5:38 PM   Addendum:  Co-ox is 31%.  CVP pending. Start milrinone 0.25.   Shaquera Ansley,MD 5:39 PM

## 2016-03-17 NOTE — ED Notes (Signed)
Patient transported to X-ray 

## 2016-03-17 NOTE — ED Notes (Signed)
Before ambulating pt sating 92. While ambulating pt dropped to 87. Pt had a very difficult time walking and used the assistance of two individuals

## 2016-03-17 NOTE — ED Notes (Signed)
Transported to ct 

## 2016-03-17 NOTE — ED Provider Notes (Addendum)
[facility]-EMERGENCY DEPT Provider Note   CSN: [id number] Arrival date & time: 03/17/16  0225  By signing my name below, I, [name], attest that this documentation has been prepared under the direction and in the presence of [name] Baton, MD. Electronically Signed: Elder [name], Scribe. 03/17/16. 2:55 AM.   History   Chief Complaint Chief Complaint  Patient presents with  . Chest Pain    HPI [name] is a 61 y.o. male with history of ischemic cardiomyopathy with intermittent complete heart block, CHF, AICD in-place, and CAD presents to the ED for evaluation of chest pain and dyspnea. This patient states that yesterday he developed dyspnea which worsened today prompting his presentation at this facility. At interview, he is also reporting 4 hours of chest "pressure" without any clear modifying factors. This onset while he was at rest. Unchanged following meals. He does state that his pain is similar to "a heart attack he had in 1998". Pain is 3/10 in severity. Denies any leg swelling. He was recently increased on his diuretic dosage yesterday because of his SOB. He is otherwise reporting 1 week of back pain which he was evaluated for 1 week ago; since that time his symptoms have not improved. Reports chronic back pain. Reports difficulty ambulating due to pain. Denies any incontience, focal weakness.  Denies any fever or cough. Of note, this patient was admitted on 12/4 for VT storm; during admission underwent L heart catheterization which was unchanged from previous.  The history is provided by the patient. No language interpreter was used.    Past Medical History:  Diagnosis Date  . Anxiety   . Automatic implantable cardioverter-defibrillator in situ    a. Medtronic ICD.  [name] CAD (coronary artery disease)    a.  Severe LAD stenosis 2/2 acute thrombus - BMS 2009;  b. 06/01/11 Cath - LAD 20 isr, 73m (3.0x16 Veri-flex BMS), OM1 100p, EF 20-25%;  c. 07/2012 Abnl Cardiolite;  d.  08/2012 Cath/PCI: LM nl, LAD patent stents, D2 80-90 (jailed), LCX 90 p/m (4.0x24 Promus Premier DES), OM1 100, OM2 80-90p (3.0x20 Promus Premier DES), RCA patent mid stent, 40d, EF 25%.  e. 10/29/15 LHC stable disease with patent stents  . Chronic systolic CHF (congestive heart failure), NYHA class 3 (HCC) - low output state 12/12/2014  . Depression   . Dyspnea   . History of rheumatic fever 1962  . HLD (hyperlipidemia) mixed  . HTN (hypertension)   . Ischemic cardiomyopathy    a.  EF 30-35% 2010;  b.  EF 20-25% by LV gram 08/2012. c. EF 15% by echo 11/2014.  [name] Myocardial infarction 1998; 2002; ~ 2010  . Paroxysmal ventricular tachycardia (HCC)    a. VFlutter  CL 210 msec  Rx shock 04/2011  . PVC's (premature ventricular contractions)   . RBBB   . TIA (transient ischemic attack)    a. Dx 11/2014, continued on ASA/Plavix.    Patient Active Problem List   Diagnosis Date Noted  . Pulmonary edema   . VF (ventricular fibrillation) (HCC) 01/27/2016  . Encounter for intubation   . Acute respiratory failure with hypoxia (HCC)   . Hypokalemia   . DOE (dyspnea on exertion) 06/19/2015  . Acute systolic congestive heart failure, NYHA class 3 (HCC)   . Status post implantation of automatic cardioverter/defibrillator (AICD) - MDT   . H/O medication noncompliance   . Acute kidney insufficiency   . Chest pain at rest   . Depression 04/09/2015  .  Acute on chronic systolic CHF (congestive heart failure) (HCC) 04/09/2015  . Erectile dysfunction 04/09/2015  . RBBB   . PVC's (premature ventricular contractions)   . Chronic systolic CHF (congestive heart failure), NYHA class 3 (HCC) - low output state 12/12/2014  . Generalized anxiety disorder 12/09/2014  . Major depressive disorder, recurrent episode (HCC) 12/09/2014  . TIA (transient ischemic attack)   . HLD (hyperlipidemia)   . Aphasia 05/04/2014  . Unstable angina (HCC) 06/02/2011  . CAD (coronary artery disease)   . Ischemic cardiomyopathy     . Automatic implantable cardioverter-defibrillator in situ   . TOBACCO ABUSE 02/04/2010  . VENTRICULAR TACHYCARDIA 08/21/2008  . HYPERLIPIDEMIA-MIXED 06/04/2008  . ANXIETY 06/04/2008  . Essential hypertension 06/04/2008  . Cardiomyopathy, ischemic 06/04/2008  . CHEST PAIN-UNSPECIFIED 06/04/2008    Past Surgical History:  Procedure Laterality Date  . CARDIAC CATHETERIZATION N/A 10/29/2015   Procedure: Right/Left Heart Cath and Coronary Angiography;  Surgeon: Peter M Swaziland, MD;  Location: [facility] INVASIVE CV LAB;  Service: Cardiovascular;  Laterality: N/A;  . CARDIAC DEFIBRILLATOR PLACEMENT     MDT single chamber primary prevention ICD implanted by Dr Graciela Husbands as part of MASTER study  . EP IMPLANTABLE DEVICE N/A 12/11/2015   Procedure: BiV Upgrade;  Surgeon: [name], MD;  Location: [facility] INVASIVE CV LAB;  Service: Cardiovascular;  Laterality: N/A;  . EP IMPLANTABLE DEVICE N/A 12/11/2015   Procedure: Pocket Revision/Relocation;  Surgeon: [name], MD;  Location: [facility] INVASIVE CV LAB;  Service: Cardiovascular;  Laterality: N/A;  . EP IMPLANTABLE DEVICE N/A 12/11/2015   Procedure: Lead Revision/Repair;  Surgeon: [name], MD;  Location: [facility] INVASIVE CV LAB;  Service: Cardiovascular;  Laterality: N/A;  . HERNIA REPAIR     "w/gallbladder OR" (08/29/2012)  . LAPAROSCOPIC CHOLECYSTECTOMY    . LEFT HEART CATHETERIZATION WITH CORONARY ANGIOGRAM N/A 06/08/2011   Procedure: LEFT HEART CATHETERIZATION WITH CORONARY ANGIOGRAM;  Surgeon: Dolores Patty, MD;  Location: [facility] CATH LAB;  Service: Cardiovascular;  Laterality: N/A;  . PERCUTANEOUS CORONARY STENT INTERVENTION (PCI-S) N/A 06/01/2011   Procedure: PERCUTANEOUS CORONARY STENT INTERVENTION (PCI-S);  Surgeon: Peter M Swaziland, MD;  Location: [facility] CATH LAB;  Service: Cardiovascular;  Laterality: N/A;  . PERCUTANEOUS CORONARY STENT INTERVENTION (PCI-S) N/A 08/29/2012   Procedure: PERCUTANEOUS CORONARY STENT INTERVENTION (PCI-S);  Surgeon: Peter M  Swaziland, MD;  Location: [facility] CATH LAB;  Service: Cardiovascular;  Laterality: N/A;  . PERIPHERAL VASCULAR CATHETERIZATION  12/11/2015   Procedure: Upper Extremity Venography;  Surgeon: [name], MD;  Location: [facility] INVASIVE CV LAB;  Service: Cardiovascular;;       Home Medications    Prior to Admission medications   Medication Sig Start Date End Date Taking? Authorizing Provider  albuterol (PROAIR HFA) 108 (90 Base) MCG/ACT inhaler Inhale 2 puffs into the lungs every 4 (four) hours as needed for wheezing or shortness of breath.   Yes Historical Provider, MD  ALPRAZolam (XANAX) 0.25 MG tablet Take 1 tablet (0.25 mg total) by mouth at bedtime as needed for anxiety. F/u with PCP for refills. 02/21/16  Yes Laurey Morale, MD  amiodarone (PACERONE) 200 MG tablet Take 1 tablet (200 mg total) by mouth daily. 03/16/16  Yes Laurey Morale, MD  aspirin EC 81 MG tablet Take 81 mg by mouth daily.   Yes Historical Provider, MD  atorvastatin (LIPITOR) 40 MG tablet Take 1 tablet (40 mg total) by mouth daily. 03/03/16  Yes [name], MD  bisoprolol (ZEBETA) 5 MG  tablet Take 0.5 tablets (2.5 mg total) by mouth at bedtime. 02/13/16  Yes Duke SalviaSteven C Klein, MD  clopidogrel (PLAVIX) 75 MG tablet Take 1 tablet (75 mg total) by mouth daily. 03/03/16  Yes Lewayne BuntingBrian S Crenshaw, MD  cyclobenzaprine (FLEXERIL) 10 MG tablet Take 1 tablet (10 mg total) by mouth 2 (two) times daily as needed for muscle spasms. 03/10/16  Yes [address]Francisco [place]  digoxin (LANOXIN) 0.125 MG tablet Take one tablet (0.125 mg) by mouth once every other day 02/11/16  Yes Duke SalviaSteven C Klein, MD  famotidine (PEPCID) 20 MG tablet Take 1 tablet (20 mg total) by mouth 2 (two) times daily. 02/02/16  Yes Bhavinkumar Bhagat, PA  furosemide (LASIX) 40 MG tablet Take 1 tablet (40 mg total) by mouth See admin instructions. Take 1 tablet (40 mg) by mouth daily, may also take an additional tablet as needed for fluid buildup 02/13/16  Yes Duke SalviaSteven C Klein, MD   hydrOXYzine (VISTARIL) 25 MG capsule Take 25 mg by mouth at bedtime as needed (sleep).   Yes Historical Provider, MD  losartan (COZAAR) 25 MG tablet Take 1 tablet (25 mg total) by mouth daily. 02/21/16  Yes Laurey Moralealton S McLean, MD  magnesium oxide (MAG-OX) 400 (241.3 Mg) MG tablet Take 1 tablet (400 mg total) by mouth daily. 02/02/16  Yes Bhavinkumar Bhagat, PA  mexiletine (MEXITIL) 200 MG capsule Take 1 capsule (200 mg total) by mouth every 12 (twelve) hours. 02/02/16  Yes Bhavinkumar Bhagat, PA  nicotine (NICODERM CQ - DOSED IN MG/24 HOURS) 21 mg/24hr patch Place 21 mg onto the skin daily.   Yes Historical Provider, MD  nitroGLYCERIN (NITROSTAT) 0.4 MG SL tablet Place 1 tablet (0.4 mg total) under the tongue every 5 (five) minutes as needed. For chest pain 03/29/15  Yes Lewayne BuntingBrian S Crenshaw, MD  oxyCODONE-acetaminophen (PERCOCET/ROXICET) 5-325 MG tablet Take 1-2 tablets by mouth every 6 (six) hours as needed for severe pain. 03/10/16  Yes [address] [place]  PARoxetine (PAXIL) 10 MG tablet Take 1 tablet (10 mg total) by mouth daily. 02/02/16  Yes Bhavinkumar Bhagat, PA  potassium chloride SA (K-DUR,KLOR-CON) 20 MEQ tablet Take 1 tablet (20 mEq total) by mouth daily. 02/02/16  Yes Bhavinkumar Bhagat, PA  spironolactone (ALDACTONE) 25 MG tablet Take 1 tablet (25 mg total) by mouth daily. Patient not taking: Reported on 03/10/2016 02/21/16   Laurey Moralealton S McLean, MD    Family History Family History  Problem Relation Age of Onset  . Cancer Father   . Cancer Sister     twin sister and only one has cancer, unknown what kind  . Hypertension Brother   . Cancer      family hx  . Heart attack Neg Hx   . Stroke Neg Hx     Social History Social History  Substance Use Topics  . Smoking status: Former Smoker    Packs/day: 0.50    Years: 44.00    Types: Cigarettes    Quit date: 11/20/2015  . Smokeless tobacco: Never Used  . Alcohol use No     Comment: 08/29/2012 "6-8 beers once/wk". Has not had alcohol  in the last 6-8 months     Allergies   Patient has no known allergies.   Review of Systems Review of Systems  Constitutional: Negative for fever.  Respiratory: Positive for shortness of breath. Negative for cough.   Cardiovascular: Positive for chest pain. Negative for leg swelling.  Genitourinary: Negative for difficulty urinating.  Musculoskeletal: Positive for back pain.  Neurological: Negative for weakness and numbness.       No incontinence.   All other systems reviewed and are negative.    Physical Exam Updated Vital Signs BP 93/67   Pulse 67   Temp 97.8 F (36.6 C) (Oral)   Resp 13   SpO2 92%   Physical Exam  Constitutional: He is oriented to person, place, and time. No distress.  HENT:  Head: Normocephalic and atraumatic.  Cardiovascular: Normal rate, regular rhythm and normal heart sounds.   No murmur heard. Pulmonary/Chest: Effort normal and breath sounds normal. No respiratory distress. He has no wheezes.  Abdominal: Soft. Bowel sounds are normal. There is no tenderness. There is no rebound.  Musculoskeletal: He exhibits edema.  Trace bilateral lower extremity edema  Lymphadenopathy:    He has no cervical adenopathy.  Neurological: He is alert and oriented to person, place, and time.  5 out of 5 strength bilateral lower extremities, no midline to palpation of the lumbar spine  Skin: Skin is warm and dry.  Psychiatric: He has a normal mood and affect.  Nursing note and vitals reviewed.    ED Treatments / Results  DIAGNOSTIC STUDIES: Oxygen Saturation is 99 percent on room air which is normal by my interpretation.    COORDINATION OF CARE: 2:32 AM Discussed treatment plan with pt at bedside and pt agreed to plan.  Labs (all labs ordered are listed, but only abnormal results are displayed) Labs Reviewed  CBC WITH DIFFERENTIAL/PLATELET - Abnormal; Notable for the following:       Result Value   WBC 10.8 (*)    RBC 3.62 (*)    Hemoglobin 11.2 (*)      HCT 34.1 (*)    RDW 15.7 (*)    Neutro Abs 8.3 (*)    All other components within normal limits  COMPREHENSIVE METABOLIC PANEL - Abnormal; Notable for the following:    BUN 27 (*)    Creatinine, Ser 1.43 (*)    GFR calc non Af Amer 51 (*)    GFR calc Af Amer 59 (*)    All other components within normal limits  BRAIN NATRIURETIC PEPTIDE - Abnormal; Notable for the following:    B Natriuretic Peptide 894.5 (*)    All other components within normal limits  DIGOXIN LEVEL - Abnormal; Notable for the following:    Digoxin Level 0.2 (*)    All other components within normal limits  LIPASE, BLOOD  I-STAT TROPOININ, ED  I-STAT TROPOININ, ED  I-STAT TROPOININ, ED    EKG  EKG Interpretation  Date/Time:  Tuesday March 17 2016 03:00:06 EST Ventricular Rate:  76 PR Interval:    QRS Duration: 211 QT Interval:  468 QTC Calculation: 527 R Axis:   -63 Text Interpretation:  Atrial-sensed ventricular-paced rhythm No significant change since last tracing Confirmed by Mikel Pyon  MD, Dontell Mian ([id number]) on 03/17/2016 3:38:40 AM       Radiology Dg Chest 2 View  Result Date: 03/17/2016 CLINICAL DATA:  Chest and lower back pain. History of 3 myocardial infarctions with stents. EXAM: CHEST  2 VIEW COMPARISON:  01/29/2016 CXR FINDINGS: Left-sided AICD device with coronary sinus, right atrial and right ventricular leads present. Cardiomegaly is identified with coronary arterial stenting. No aortic aneurysm. Chronic mild interstitial prominence without pneumonic consolidation, effusion or pneumothorax. Mild apical pleuroparenchymal thickening and scarring. No acute osseous abnormality. IMPRESSION: Cardiomegaly without acute pulmonary disease. Electronically Signed   By: Tollie Eth M.D.   On: 03/17/2016 03:53  Procedures Procedures (including critical care time)  Medications Ordered in ED Medications  aspirin chewable tablet 324 mg (0 mg Oral Hold 03/17/16 0435)  morphine 4 MG/ML injection 4 mg (4  mg Intravenous Given 03/17/16 0342)  ondansetron (ZOFRAN) injection 4 mg (4 mg Intravenous Given 03/17/16 0341)  sodium chloride 0.9 % bolus 250 mL (0 mLs Intravenous Stopped 03/17/16 0407)  furosemide (LASIX) injection 40 mg (40 mg Intravenous Given 03/17/16 0547)  oxyCODONE-acetaminophen (PERCOCET/ROXICET) 5-325 MG per tablet 1 tablet (1 tablet Oral Given 03/17/16 0549)     Initial Impression / Assessment and Plan / ED Course  I have reviewed the triage vital signs and the nursing notes.  Pertinent labs & imaging results that were available during my care of the patient were reviewed by me and considered in my medical decision making (see chart for details).    Patient presents with one-day history of worsening shortness of breath. He increased his Lasix with minimal improvement. Also reports chest pain. Nontoxic on exam. He is in a paced rhythm on his EKG. Vital signs notable for blood pressures 90-100 systolic. Patient states that this is his baseline. He is on multiple blood pressure medications and reports at times he feels dizzy. He plans to discuss this with his cardiologist. Chest x-ray does not show any evidence of pulmonary edema.  The patient did have a CT scan in August that showed some widening of the thoracic aorta. Chest x-ray is stable. A section is a consideration; however, given shortness of breath this would be somewhat atypical. He only has trace edema to the lower extremities. Troponin negative. BNP is somewhat elevated but difficult to interpret in the setting of his systolic heart failure. I discussed the patient with the on-call cardiology fellow regarding my current concerns that he may be in early heart failure and is not a great candidate for aggressive diuresis given his blood pressures. He recommends IV Lasix and reassessment. If he gets dizzy or orthostatic, recommend admission. Repeat troponin negative. Patient's orthostatics are negative; however, he ambulated and drop his  pulse ox to 87%. CT ordered. Anticipate patient will need admission given hypoxia with ambulation.    Final Clinical Impressions(s) / ED Diagnoses   Final diagnoses:  SOB (shortness of breath)  Acute on chronic systolic congestive heart failure (HCC)    New Prescriptions New Prescriptions   No medications on file   I personally performed the services described in this documentation, which was scribed in my presence. The recorded information has been reviewed and is accurate.    [name] Baton, MD 03/17/16 1610    [name] Baton, MD 03/17/16 [phone]

## 2016-03-17 NOTE — ED Triage Notes (Signed)
[facility] for [facility] pain. Complains of midsternal chest pressure and mild SOB. Denies diaphoresis. Pt has a pacemaker. Pt took 324 of aspirin. Hx of chf

## 2016-03-18 ENCOUNTER — Inpatient Hospital Stay (HOSPITAL_COMMUNITY): Payer: Medicaid Other

## 2016-03-18 DIAGNOSIS — I472 Ventricular tachycardia: Secondary | ICD-10-CM

## 2016-03-18 LAB — BASIC METABOLIC PANEL
ANION GAP: 6 (ref 5–15)
BUN: 28 mg/dL — AB (ref 6–20)
CHLORIDE: 102 mmol/L (ref 101–111)
CO2: 27 mmol/L (ref 22–32)
Calcium: 8.6 mg/dL — ABNORMAL LOW (ref 8.9–10.3)
Creatinine, Ser: 1.35 mg/dL — ABNORMAL HIGH (ref 0.61–1.24)
GFR calc Af Amer: >60 mL/min (ref >60)
GFR, EST NON AFRICAN AMERICAN: 54 mL/min — AB (ref >60)
GLUCOSE: 98 mg/dL (ref 65–99)
POTASSIUM: 4.2 mmol/L (ref 3.5–5.1)
Sodium: 135 mmol/L (ref 135–145)

## 2016-03-18 LAB — COOXEMETRY PANEL
CARBOXYHEMOGLOBIN: 2.1 % — AB (ref 0.5–1.5)
METHEMOGLOBIN: 0.8 % (ref 0.0–1.5)
O2 Saturation: 54.1 %
Total hemoglobin: 10.5 g/dL — ABNORMAL LOW (ref 12.0–16.0)

## 2016-03-18 MED ORDER — TRAMADOL HCL 50 MG PO TABS
50.0000 mg | ORAL_TABLET | Freq: Two times a day (BID) | ORAL | Status: DC | PRN
  Administered 2016-03-21 – 2016-03-28 (×3): 100 mg via ORAL
  Filled 2016-03-18 (×3): qty 2

## 2016-03-18 MED ORDER — SPIRONOLACTONE 25 MG PO TABS
12.5000 mg | ORAL_TABLET | Freq: Every day | ORAL | Status: DC
  Administered 2016-03-18 – 2016-03-19 (×2): 12.5 mg via ORAL
  Filled 2016-03-18 (×2): qty 1

## 2016-03-18 MED ORDER — FUROSEMIDE 40 MG PO TABS
40.0000 mg | ORAL_TABLET | Freq: Two times a day (BID) | ORAL | Status: DC
  Administered 2016-03-18 – 2016-03-23 (×9): 40 mg via ORAL
  Filled 2016-03-18 (×10): qty 1

## 2016-03-18 NOTE — Consult Note (Signed)
ELECTROPHYSIOLOGY CONSULT NOTE    Patient ID: [name] MRN: [id number], DOB/AGE: 63-Aug-1957 63 y.o.  Admit date: 03/17/2016 Date of Consult: ,td   Primary Physician: Tula Nakayama, MD Primary Cardiologist: Dr. Shirlee Latch Requesting MD: Dr. Shirlee Latch  Reason for Consultation: evaluate regarding LV lead/CRT therapy  HPI: [name] is a 61 y.o. male PMHx of CAD, ICM with intermittent CHB, RBBB,chronic CHF, active smoker, HTN, upgrade to CRT device in October 2017, Dec 2017 had VT storm receiving >40 shocks and his LV lead programmed off with suspicion LV pacing was proarrhythmic for this patient.  He was admitted yesterday with c/o worsening SOB and increasing weight, felt to have an acute exacerbation of CHF, advanced CHF team feels most likely to represent low output failure feeling like his was not markedly fluid overloaded.  EP is asked to evaluate/comment on consideration of reprogramming LV lead back on.  There is discussion on initiating process for LVAD.  The patient is feeling "OK", mentions he is worried about doing anything at all exertional that may get his HR up in fear of being shocked.  No CP, no near syncope or syncope, no rest SOB.  Reports getting winded pretty easily.  Labs: Co-ox: 31% > milrinone > 54% BUN/Creat 28/1.35 (about baseline) Dig <0.2 H/H 11/34 WBC 10.8plts 260   DEVICE HISTORY:  01/27/16: VT storm with > 40shocks, LV lead was programmed off with suspicion of proarrhythmia MDT CTP-ICD upgrade 12/11/15, original device was a single lead device implanted originally 2004, gen change 2010 + appropriate therapy for VFlutter in 2103  Past Medical History:  Diagnosis Date  . Anxiety   . Automatic implantable cardioverter-defibrillator in situ    a. Medtronic ICD.  [name] CAD (coronary artery disease)    a.  Severe LAD stenosis 2/2 acute thrombus - BMS 2009;  b. 06/01/11 Cath - LAD 20 isr, 28m (3.0x16 Veri-flex BMS), OM1 100p, EF 20-25%;  c. 07/2012 Abnl  Cardiolite;  d. 08/2012 Cath/PCI: LM nl, LAD patent stents, D2 80-90 (jailed), LCX 90 p/m (4.0x24 Promus Premier DES), OM1 100, OM2 80-90p (3.0x20 Promus Premier DES), RCA patent mid stent, 40d, EF 25%.  e. 10/29/15 LHC stable disease with patent stents  . Chronic systolic CHF (congestive heart failure), NYHA class 3 (HCC) - low output state 12/12/2014  . Depression   . Dyspnea   . History of rheumatic fever 1962  . HLD (hyperlipidemia) mixed  . HTN (hypertension)   . Ischemic cardiomyopathy    a.  EF 30-35% 2010;  b.  EF 20-25% by LV gram 08/2012. c. EF 15% by echo 11/2014.  [name] Myocardial infarction 1998; 2002; ~ 2010  . Paroxysmal ventricular tachycardia (HCC)    a. VFlutter  CL 210 msec  Rx shock 04/2011  . PVC's (premature ventricular contractions)   . RBBB   . TIA (transient ischemic attack)    a. Dx 11/2014, continued on ASA/Plavix.     Surgical History:  Past Surgical History:  Procedure Laterality Date  . CARDIAC CATHETERIZATION N/A 10/29/2015   Procedure: Right/Left Heart Cath and Coronary Angiography;  Surgeon: Peter M Swaziland, MD;  Location[facility] INVASIVE CV LAB;  Service: Cardiovascular;  Laterality: N/A;  . CARDIAC DEFIBRILLATOR PLACEMENT     MDT single chamber primary prevention ICD implanted by Dr Graciela Husbands as part of MASTER study  . EP IMPLANTABLE DEVICE N/A 12/11/2015   Procedure: BiV Upgrade;  Surgeon: [name], MD;  Location: [facility] INVASIVE CV LAB;  Service: Cardiovascular;  Laterality: N/A;  . EP IMPLANTABLE DEVICE N/A 12/11/2015   Procedure: Pocket Revision/Relocation;  Surgeon: Duke SalviaSteven C Fronnie Urton, MD;  [facility];  Service: Cardiovascular;  Laterality: N/A;  . EP IMPLANTABLE DEVICE N/A 12/11/2015   Procedure: Lead Revision/Repair;  Surgeon: Duke SalviaSteven C Rey Dansby, MD;  [facility];  Service: Cardiovascular;  Laterality: N/A;  . HERNIA REPAIR     "w/gallbladder OR" (08/29/2012)  . LAPAROSCOPIC CHOLECYSTECTOMY    . LEFT HEART CATHETERIZATION WITH  CORONARY ANGIOGRAM N/A 06/08/2011   Procedure: LEFT HEART CATHETERIZATION WITH CORONARY ANGIOGRAM;  Surgeon: Dolores Pattyaniel R Bensimhon, MD;  [facility];  Service: Cardiovascular;  Laterality: N/A;  . PERCUTANEOUS CORONARY STENT INTERVENTION (PCI-S) N/A 06/01/2011   Procedure: PERCUTANEOUS CORONARY STENT INTERVENTION (PCI-S);  Surgeon: Peter M SwazilandJordan, MD;  [facility];  Service: Cardiovascular;  Laterality: N/A;  . PERCUTANEOUS CORONARY STENT INTERVENTION (PCI-S) N/A 08/29/2012   Procedure: PERCUTANEOUS CORONARY STENT INTERVENTION (PCI-S);  Surgeon: Peter M SwazilandJordan, MD;  [facility];  Service: Cardiovascular;  Laterality: N/A;  . PERIPHERAL VASCULAR CATHETERIZATION  12/11/2015   Procedure: Upper Extremity Venography;  Surgeon: Duke SalviaSteven C Girl Schissler, MD;  [facility];  Service: Cardiovascular;;     Prescriptions Prior to Admission  Medication Sig Dispense Refill Last Dose  . albuterol (PROAIR HFA) 108 (90 Base) MCG/ACT inhaler Inhale 2 puffs into the lungs every 4 (four) hours as needed for wheezing or shortness of breath.   unk  . ALPRAZolam (XANAX) 0.25 MG tablet Take 1 tablet (0.25 mg total) by mouth at bedtime as needed for anxiety. F/u with PCP for refills. 30 tablet 0 unk  . amiodarone (PACERONE) 200 MG tablet Take 1 tablet (200 mg total) by mouth daily. 30 tablet 3 03/16/2016 at Unknown time  . aspirin EC 81 MG tablet Take 81 mg by mouth daily.   03/16/2016 at Unknown time  . atorvastatin (LIPITOR) 40 MG tablet Take 1 tablet (40 mg total) by mouth daily. 30 tablet 5 03/16/2016 at Unknown time  . bisoprolol (ZEBETA) 5 MG tablet Take 0.5 tablets (2.5 mg total) by mouth at bedtime. 15 tablet 11 03/16/2016 at 1900  . clopidogrel (PLAVIX) 75 MG tablet Take 1 tablet (75 mg total) by mouth daily. 30 tablet 5 03/16/2016 at Unknown time  . cyclobenzaprine (FLEXERIL) 10 MG tablet Take 1 tablet (10 mg total) by mouth 2 (two) times daily as needed for muscle spasms. 20 tablet 0  Past Week at Unknown time  . digoxin (LANOXIN) 0.125 MG tablet Take one tablet (0.125 mg) by mouth once every other day 30 tablet 3 Past Week at Unknown time  . famotidine (PEPCID) 20 MG tablet Take 1 tablet (20 mg total) by mouth 2 (two) times daily. 60 tablet 6 03/16/2016 at Unknown time  . furosemide (LASIX) 40 MG tablet Take 1 tablet (40 mg total) by mouth See admin instructions. Take 1 tablet (40 mg) by mouth daily, may also take an additional tablet as needed for fluid buildup 30 tablet 11 03/16/2016 at Unknown time  . hydrOXYzine (VISTARIL) 25 MG capsule Take 25 mg by mouth at bedtime as needed (sleep).   unk  . losartan (COZAAR) 25 MG tablet Take 1 tablet (25 mg total) by mouth daily. 30 tablet 6 03/16/2016 at Unknown time  . magnesium oxide (MAG-OX) 400 (241.3 Mg) MG tablet Take 1 tablet (400 mg total) by mouth daily. 30 tablet 6 03/16/2016 at Unknown time  . mexiletine (  MEXITIL) 200 MG capsule Take 1 capsule (200 mg total) by mouth every 12 (twelve) hours. 60 capsule 3 03/16/2016 at Unknown time  . nicotine (NICODERM CQ - DOSED IN MG/24 HOURS) 21 mg/24hr patch Place 21 mg onto the skin daily.   03/16/2016 at Unknown time  . nitroGLYCERIN (NITROSTAT) 0.4 MG SL tablet Place 1 tablet (0.4 mg total) under the tongue every 5 (five) minutes as needed. For chest pain 25 tablet 11 unk  . oxyCODONE-acetaminophen (PERCOCET/ROXICET) 5-325 MG tablet Take 1-2 tablets by mouth every 6 (six) hours as needed for severe pain. 10 tablet 0 Past Week at Unknown time  . PARoxetine (PAXIL) 10 MG tablet Take 1 tablet (10 mg total) by mouth daily. 30 tablet 3 03/16/2016 at Unknown time  . potassium chloride SA (K-DUR,KLOR-CON) 20 MEQ tablet Take 1 tablet (20 mEq total) by mouth daily. 30 tablet 3 03/16/2016 at Unknown time  . spironolactone (ALDACTONE) 25 MG tablet Take 1 tablet (25 mg total) by mouth daily. (Patient not taking: Reported on 03/10/2016) 30 tablet 5 Not Taking at Unknown time    Inpatient Medications:  .  amiodarone  200 mg Oral Daily  . aspirin EC  81 mg Oral Daily  . atorvastatin  40 mg Oral q1800  . clopidogrel  75 mg Oral Daily  . digoxin  0.125 mg Oral QODAY  . enoxaparin (LOVENOX) injection  40 mg Subcutaneous Q24H  . famotidine  20 mg Oral BID  . furosemide  40 mg Oral BID  . losartan  25 mg Oral Daily  . magnesium oxide  400 mg Oral Daily  . mexiletine  200 mg Oral Q12H  . nicotine  21 mg Transdermal Daily  . PARoxetine  10 mg Oral Daily  . sodium chloride flush  3 mL Intravenous Q12H  . spironolactone  12.5 mg Oral QHS    Allergies: No Known Allergies  Social History   Social History  . Marital status: Legally Separated    Spouse name: N/A  . Number of children: N/A  . Years of education: N/A   Occupational History  . Not on file.   Social History Main Topics  . Smoking status: Former Smoker    Packs/day: 1.00    Years: 47.00    Types: Cigarettes    Quit date: 11/20/2015  . Smokeless tobacco: Never Used  . Alcohol use 3.6 oz/week    6 Cans of beer per week  . Drug use: No  . Sexual activity: Not Currently   Other Topics Concern  . Not on file   Social History Narrative   Married; full time.      Family History  Problem Relation Age of Onset  . Cancer Father   . Cancer Sister     twin sister and only one has cancer, unknown what kind  . Hypertension Brother   . Cancer      family hx  . Heart attack Neg Hx   . Stroke Neg Hx      Review of Systems: All other systems reviewed and are otherwise negative except as noted above.  Physical Exam: Vitals:   03/18/16 0423 03/18/16 0800 03/18/16 0900 03/18/16 1100  BP:  (!) 93/56 (!) 110/53 105/62  Pulse:  (!) 59 67 67  Resp:  (!) 22 16 15   Temp:  97.9 F (36.6 C)    TempSrc:      SpO2:  100% 100% 100%  Weight: 211 lb 11.2 oz (96 kg)  Height:        GEN- The patient is ill appearing, in NAD, alert and oriented x 3 today.   HEENT: normocephalic, atraumatic; sclera clear, conjunctiva pink;  hearing intact; oropharynx clear; neck supple, no JVP Lymph- no cervical lymphadenopathy Lungs- Clear to ausculation bilaterally, normal work of breathing.  No wheezes, rales, rhonchi Heart- RRR no murmurs, rubs or gallops, PMI not laterally displaced GI- soft, non-tender, non-distended Extremities- no clubbing, cyanosis, no edema MS- no significant deformity or atrophy Skin- warm and dry, no rash or lesion Psych- euthymic mood, full affect Neuro- no gross deficits observed  Labs:   Lab Results  Component Value Date   WBC 10.8 (H) 03/17/2016   HGB 11.2 (L) 03/17/2016   HCT 34.1 (L) 03/17/2016   MCV 94.2 03/17/2016   PLT 260 03/17/2016    Recent Labs Lab 03/17/16 0311 03/18/16 0411  NA 135 135  K 4.4 4.2  CL 101 102  CO2 26 27  BUN 27* 28*  CREATININE 1.43* 1.35*  CALCIUM 9.2 8.6*  PROT 6.9  --   BILITOT 0.8  --   ALKPHOS 47  --   ALT 17  --   AST 17  --   GLUCOSE 96 98      Radiology/Studies:   Dg Lumbar Spine 2-3 Views Result Date: 03/18/2016 CLINICAL DATA:  Acute low back pain without known injury. EXAM: LUMBAR SPINE - 2-3 VIEW COMPARISON:  CT scan of December 16, 2015. FINDINGS: No fracture or spondylolisthesis is noted. Atherosclerosis of abdominal aorta is noted. Mild degenerative disc disease is noted at L3-4. Remaining disc spaces appear intact. IMPRESSION: Mild degenerative disc disease at L3-4. No acute abnormality seen in the lumbar spine. Aortic atherosclerosis. Electronically Signed   By: Lupita Raider, M.D.   On: 03/18/2016 11:36   Dg Chest Port 1 View Result Date: 03/17/2016 CLINICAL DATA:  PICC line placement EXAM: PORTABLE CHEST 1 VIEW COMPARISON:  03/17/2016 FINDINGS: The visualized bony structures of the thorax are intact. 2054 hours. Right PICC line tip overlies the distal SVC level. The cardio pericardial silhouette is enlarged. The lungs are clear wiithout focal pneumonia, edema, pneumothorax or pleural effusion. Left pacer/AICD again noted.  IMPRESSION: 1. Right PICC line tip overlies the distal SVC. 2. Cardiomegaly. Electronically Signed   By: Kennith Center M.D.   On: 03/17/2016 21:05   Ct Angio Chest/abd/pel For Dissection W And/or Wo Contrast Result Date: 03/17/2016 CLINICAL DATA:  Right-sided chest pain EXAM: CT ANGIOGRAPHY CHEST, ABDOMEN AND PELVIS TECHNIQUE: Multidetector CT imaging through the chest, abdomen and pelvis was performed using the standard protocol during bolus administration of intravenous contrast. Multiplanar reconstructed images and MIPs were obtained and reviewed to evaluate the vascular anatomy. CONTRAST:  100 mL Isovue 370. COMPARISON:  Chest x-ray from earlier in the same day, 12/16/2015, 10/18/2015 FINDINGS: CTA CHEST FINDINGS Cardiovascular: The thoracic aorta shows mild atherosclerotic calcifications without aneurysmal dilatation or dissection. Pulmonary artery shows a normal branching pattern without evidence of pulmonary embolism. Coronary calcifications and prior coronary stenting is noted. A defibrillator is again noted. Cardiac structures are mildly enlarged but stable. Multiple chest wall venous collaterals are identified likely related to stenosis of the left subclavian vein from the defibrillator. Mediastinum/Nodes: Thoracic inlet is within normal limits. Stable mediastinal adenopathy is noted in the right peritracheal and subcarinal region with the largest of these in the right peritracheal region measuring 16 mm in short axis which is stable from the prior exam. Scattered small hilar lymph  nodes are noted also stable from the previous study. The esophagus as visualize is within normal limits. Lungs/Pleura: Mild emphysematous changes are noted. No focal infiltrate or sizable effusion is seen. Mild peribronchial thickening is noted which is stable from the prior exam likely of a chronic nature. No acute abnormality is seen. Musculoskeletal: Degenerative changes of the thoracic spine are noted. No acute bony  abnormality is seen. Review of the MIP images confirms the above findings. CTA ABDOMEN AND PELVIS FINDINGS VASCULAR Aorta: The abdominal aorta demonstrates atherosclerotic calcifications without aneurysmal dilatation or evidence of dissection. Celiac: Mild atherosclerotic calcifications are noted at the origin although the celiac axis is widely patent. SMA: Mild atherosclerotic changes are noted at the origin although the superior mesenteric artery is patent. Renals: Single renal arteries are identified bilaterally with mild atherosclerotic calcifications. No focal hemodynamically significant stenosis is noted. IMA: Widely patent Iliacs: Mild atherosclerotic changes are seen without aneurysmal dilatation or dissection. Veins: Although not timed for venous evaluation no definitive abnormality is seen. Review of the MIP images confirms the above findings. NON-VASCULAR Hepatobiliary: Multiple hypodensities are again identified within the liver consistent with hepatic cysts. These are stable from previous exams. The gallbladder has been surgically removed. Pancreas: Unremarkable. No pancreatic ductal dilatation or surrounding inflammatory changes. Spleen: Normal in size without focal abnormality. Adrenals/Urinary Tract: The adrenal glands are within normal limits. Kidneys are well visualized bilaterally without evidence of renal mass or calcification. No obstructive changes are seen. The bladder is well distended. Stomach/Bowel: Scattered diverticular changes are noted throughout the colon. The appendix is well visualized and within normal limits. No obstructive changes are seen. No inflammatory changes are identified. Lymphatic: No significant lymphadenopathy is identified. Reproductive: Prostate is unremarkable. Other: No abdominal wall hernia or abnormality. No abdominopelvic ascites. Musculoskeletal: No acute or significant osseous findings. Review of the MIP images confirms the above findings. IMPRESSION: CTA of  the chest: No evidence of aortic dissection or aneurysmal dilatation. No definitive pulmonary emboli are identified. Stable mediastinal and hilar lymph nodes likely reactive in nature. Mild changes of bronchitis are noted as well. 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 CTA of the abdomen and pelvis. No aneurysmal dilatation or dissection is noted. Scattered chronic changes similar to that noted on previous exams. Electronically Signed   By: Alcide Clever M.D.   On: 03/17/2016 08:33    EKG:SR, Vpaced TELEMETRY: SR, V paced, AV paced  01/30/16: LHC Left ventriculography not done.  LV not entered due to multiple runs of VT this admission.  Right coronary artery: Dominant.  There was a stent in the mid RCA with 30-40% in-stent restenosis.  Otherwise luminarl irregularities.  Left main: Short, no significant disease.  Left circumflex system: Occluded small to moderate OM1, no change from prior cath. OM2 with 40 ostial stenosis.  Patent stent in proximal LCx.   Left anterior descending system: Patent long stented segment in the proximal LAD.  Small D1.  Small to moderate D2 with ostial 90% stenosis.  D2 originates from the stented segment of proximal LAD so likely jailed.  No change from prior cath.  Impression:  No change compared to prior coronary angiography in 9/17.  No target for intervention.  01/02/16: CPX: Conclusion: The interpretation of this test is limited due to submaximal effort during the exercise. Based on available data, exercise testing with gas exchange demonstrates at least mild functional impairment due to heart failure. No ventilatory limitation indicated. VE/VCO2 slope significantly elevated  and predicts poor-short term prognosis.   12/10/15: TTE Study Conclusions - Left ventricle: The cavity size was severely dilated. There was   moderate concentric hypertrophy. Systolic function was severely   reduced. The estimated  ejection fraction was 15%. There is   akinesis of the mid-apicallateral and inferoseptal myocardium.   There is akinesis of the apical myocardium. There is akinesis of   the mid-apicalanterior, inferolateral, and inferior myocardium.   The study is not technically sufficient to allow evaluation of LV   diastolic function. - Ventricular septum: Septal motion showed moderate paradox. These   changes are consistent with right ventricular pacing. - Aortic valve: There was mild regurgitation. - Aorta: Aortic root dimension: 38 mm (ED). - Aortic root: The aortic root was mildly dilated. - Mitral valve: There was mild to moderate regurgitation. - Left atrium: The atrium was moderately dilated. - Right ventricle: The cavity size was mildly dilated. Wall   thickness was normal. - Pulmonic valve: There was trivial regurgitation. - Pulmonary arteries: PA peak pressure: 53 mm Hg (S). Impressions: - The right ventricular systolic pressure was increased consistent   with moderate pulmonary hypertension.    Assessment and Plan:   1. Acute on chronic CHF exacerbation     Fluid negative -1612 cumulative     No change in weight     Co-ox on Milrinone today 54%   2. Hx of VT storm 01/28/16     No further shocks     LV lead programmed off     Continue amiodarone and Mexiletine     On Paxil for PTSD secondary to numerous ICD shocks    3. CAD     Stable by cath last month   4. Low back pain     Suspect musculoskeletal, w/u including BC in progress   In discussion with Dr. Shirlee Latch, doubtful that BiVe pacing will provide enough improvement to avoid LVAD, (not transplant candidate given he only recently quit smoking).  Given the severity and incessant VT suspected that LV pacing to be the eitiology, and since likely will require LVAD regardless, would not resume LV pacing.     Continue care with advanced heart failure team, EP service remains available, please recall if  needed.    Signed, Francis Dowse, PA-C 03/18/2016 12:04 PM   Seen and examined Complicated problem with lifethreatening VT following implantation of an LV lead is upgrade for his device. The concern is that the LV lead was proarrhythmic. We will not resume LV pacing. I'm concerned about potential arrhythmogenicity of milrinone. And would be inclined to try to get him off as opposed to discharge him with it.  In the event that we were to proceed with LVAD, it would allow [place] to inactivate high-voltage therapy removing the trigger for his PTSD as well as potentially allow [place] to discontinue or reduce his antiarrhythmic drugs with her potential toxicities

## 2016-03-18 NOTE — Progress Notes (Addendum)
Advanced Heart Failure Rounding Note  Primary Cardiologist: Dr Jens Som Primary HF: Dr. Shirlee Latch   Subjective:    Admitted from ED 03/17/16 with recurrent low output HF with NYHA IIIb-IV symptoms.   Initial Coox 30%, however by time stamps this was PRIOR to PICC placement. So likely inaccurate/peripheral draw.   This am, however, Coox 54.1% on milrinone 0.25 mcg/kg/min.   Feeling better this am.  De-saturates with walking around room. 02 stable at rest.   Out 1.5 L and weight stable on Lasix 80 mg IV BID. Creatinine stable to improved.   Objective:   Weight Range: 211 lb 11.2 oz (96 kg) Body mass index is 29.53 kg/m.   Vital Signs:   Temp:  [97.2 F (36.2 C)-97.6 F (36.4 C)] 97.5 F (36.4 C) (01/24 0400) Pulse Rate:  [58-71] 58 (01/24 0400) Resp:  [11-24] 14 (01/24 0400) BP: (85-106)/(49-74) 103/60 (01/24 0400) SpO2:  [96 %-100 %] 100 % (01/24 0400) Weight:  [211 lb 6.4 oz (95.9 kg)-211 lb 11.2 oz (96 kg)] 211 lb 11.2 oz (96 kg) (01/24 0423) Last BM Date: 03/16/16  Weight change: Filed Weights   03/17/16 1708 03/18/16 0423  Weight: 211 lb 6.4 oz (95.9 kg) 211 lb 11.2 oz (96 kg)    Intake/Output:   Intake/Output Summary (Last 24 hours) at 03/18/16 0727 Last data filed at 03/18/16 0600  Gross per 24 hour  Intake           558.88 ml  Output             2100 ml  Net         -1541.12 ml     Physical Exam: General: Fatigued. Appears older than stated age.   HEENT: Normal.  Neck: supple. JVD 7-8 cm cm. Carotids 2+ bilat; no bruits. No thyromegaly or nodule noted.  Cor: PMI nondisplaced. Regular. No M/G/R Lungs: Mildly diminished basilar sounds.  Abdomen: soft, NT, mildly distended, no HSM. No bruits or masses. +BS  Extremities: no cyanosis, clubbing, rash. No peripheral edema. Slightly cool.  Neuro: alert & oriented x 3, cranial nerves grossly intact. moves all 4 extremities w/o difficulty. Affect pleasant.  Telemetry: Reviewed, A sensed V  paced.  Labs: CBC  Recent Labs  03/17/16 0311  WBC 10.8*  NEUTROABS 8.3*  HGB 11.2*  HCT 34.1*  MCV 94.2  PLT 260   Basic Metabolic Panel  Recent Labs  03/17/16 0311 03/18/16 0411  NA 135 135  K 4.4 4.2  CL 101 102  CO2 26 27  GLUCOSE 96 98  BUN 27* 28*  CREATININE 1.43* 1.35*  CALCIUM 9.2 8.6*   Liver Function Tests  Recent Labs  03/17/16 0311  AST 17  ALT 17  ALKPHOS 47  BILITOT 0.8  PROT 6.9  ALBUMIN 3.6    Recent Labs  03/17/16 0311  LIPASE 14   Cardiac Enzymes No results for input(s): CKTOTAL, CKMB, CKMBINDEX, TROPONINI in the last 72 hours.  BNP: BNP (last 3 results)  Recent Labs  01/13/16 1008 01/27/16 1155 03/17/16 0311  BNP 256.0* 595.7* 894.5*    ProBNP (last 3 results) No results for input(s): PROBNP in the last 8760 hours.   D-Dimer No results for input(s): DDIMER in the last 72 hours. Hemoglobin A1C No results for input(s): HGBA1C in the last 72 hours. Fasting Lipid Panel No results for input(s): CHOL, HDL, LDLCALC, TRIG, CHOLHDL, LDLDIRECT in the last 72 hours. Thyroid Function Tests No results for input(s): TSH, T4TOTAL, T3FREE,  THYROIDAB in the last 72 hours.  Invalid input(s): FREET3  Other results:     Imaging/Studies:  Dg Chest Port 1 View  Result Date: 03/17/2016 CLINICAL DATA:  PICC line placement EXAM: PORTABLE CHEST 1 VIEW COMPARISON:  03/17/2016 FINDINGS: The visualized bony structures of the thorax are intact. 2054 hours. Right PICC line tip overlies the distal SVC level. The cardio pericardial silhouette is enlarged. The lungs are clear wiithout focal pneumonia, edema, pneumothorax or pleural effusion. Left pacer/AICD again noted. IMPRESSION: 1. Right PICC line tip overlies the distal SVC. 2. Cardiomegaly. Electronically Signed   By: Kennith Center M.D.   On: 03/17/2016 21:05   Ct Angio Chest/abd/pel For Dissection W And/or Wo Contrast  Result Date: 03/17/2016 CLINICAL DATA:  Right-sided chest pain  EXAM: CT ANGIOGRAPHY CHEST, ABDOMEN AND PELVIS TECHNIQUE: Multidetector CT imaging through the chest, abdomen and pelvis was performed using the standard protocol during bolus administration of intravenous contrast. Multiplanar reconstructed images and MIPs were obtained and reviewed to evaluate the vascular anatomy. CONTRAST:  100 mL Isovue 370. COMPARISON:  Chest x-ray from earlier in the same day, 12/16/2015, 10/18/2015 FINDINGS: CTA CHEST FINDINGS Cardiovascular: The thoracic aorta shows mild atherosclerotic calcifications without aneurysmal dilatation or dissection. Pulmonary artery shows a normal branching pattern without evidence of pulmonary embolism. Coronary calcifications and prior coronary stenting is noted. A defibrillator is again noted. Cardiac structures are mildly enlarged but stable. Multiple chest wall venous collaterals are identified likely related to stenosis of the left subclavian vein from the defibrillator. Mediastinum/Nodes: Thoracic inlet is within normal limits. Stable mediastinal adenopathy is noted in the right peritracheal and subcarinal region with the largest of these in the right peritracheal region measuring 16 mm in short axis which is stable from the prior exam. Scattered small hilar lymph nodes are noted also stable from the previous study. The esophagus as visualize is within normal limits. Lungs/Pleura: Mild emphysematous changes are noted. No focal infiltrate or sizable effusion is seen. Mild peribronchial thickening is noted which is stable from the prior exam likely of a chronic nature. No acute abnormality is seen. Musculoskeletal: Degenerative changes of the thoracic spine are noted. No acute bony abnormality is seen. Review of the MIP images confirms the above findings. CTA ABDOMEN AND PELVIS FINDINGS VASCULAR Aorta: The abdominal aorta demonstrates atherosclerotic calcifications without aneurysmal dilatation or evidence of dissection. Celiac: Mild atherosclerotic  calcifications are noted at the origin although the celiac axis is widely patent. SMA: Mild atherosclerotic changes are noted at the origin although the superior mesenteric artery is patent. Renals: Single renal arteries are identified bilaterally with mild atherosclerotic calcifications. No focal hemodynamically significant stenosis is noted. IMA: Widely patent Iliacs: Mild atherosclerotic changes are seen without aneurysmal dilatation or dissection. Veins: Although not timed for venous evaluation no definitive abnormality is seen. Review of the MIP images confirms the above findings. NON-VASCULAR Hepatobiliary: Multiple hypodensities are again identified within the liver consistent with hepatic cysts. These are stable from previous exams. The gallbladder has been surgically removed. Pancreas: Unremarkable. No pancreatic ductal dilatation or surrounding inflammatory changes. Spleen: Normal in size without focal abnormality. Adrenals/Urinary Tract: The adrenal glands are within normal limits. Kidneys are well visualized bilaterally without evidence of renal mass or calcification. No obstructive changes are seen. The bladder is well distended. Stomach/Bowel: Scattered diverticular changes are noted throughout the colon. The appendix is well visualized and within normal limits. No obstructive changes are seen. No inflammatory changes are identified. Lymphatic: No significant lymphadenopathy is identified. Reproductive:  Prostate is unremarkable. Other: No abdominal wall hernia or abnormality. No abdominopelvic ascites. Musculoskeletal: No acute or significant osseous findings. Review of the MIP images confirms the above findings. IMPRESSION: CTA of the chest: No evidence of aortic dissection or aneurysmal dilatation. No definitive pulmonary emboli are identified. Stable mediastinal and hilar lymph nodes likely reactive in nature. Mild changes of bronchitis are noted as well. Changes of left subclavian venous stenosis  likely related to the previously placed defibrillator. CTA of the abdomen and pelvis. No aneurysmal dilatation or dissection is noted. Scattered chronic changes similar to that noted on previous exams. Electronically Signed   By: Alcide Clever M.D.   On: 03/17/2016 08:33       Medications:     Scheduled Medications: . amiodarone  200 mg Oral Daily  . aspirin EC  81 mg Oral Daily  . atorvastatin  40 mg Oral q1800  . clopidogrel  75 mg Oral Daily  . digoxin  0.125 mg Oral QODAY  . enoxaparin (LOVENOX) injection  40 mg Subcutaneous Q24H  . famotidine  20 mg Oral BID  . furosemide  80 mg Intravenous BID  . losartan  25 mg Oral Daily  . magnesium oxide  400 mg Oral Daily  . mexiletine  200 mg Oral Q12H  . nicotine  21 mg Transdermal Daily  . PARoxetine  10 mg Oral Daily  . sodium chloride flush  3 mL Intravenous Q12H     Infusions: . milrinone 0.25 mcg/kg/min (03/18/16 0444)     PRN Medications:  sodium chloride, acetaminophen, albuterol, ALPRAZolam, cyclobenzaprine, hydrOXYzine, nitroGLYCERIN, ondansetron (ZOFRAN) IV, oxyCODONE-acetaminophen, sodium chloride flush, sodium chloride flush   Assessment/Plan   1. Acute on chronic systolic CHF - Echo 11/2015 LVEF 15% 2/2 ICM - Volume status look better on exam. CVP 6-7. Will resume home lasix at increase dose of 40 mg BID.  - Symptoms improved with milrinone. Suspect low output primary problem.  - Coox 54 % on milrinone 0.25 mcg/kg/min. - No BB with low output.   - Continue spiro 25 mg daily - Continue losartan 25 mg daily.   - Continue digoxin.   - Have previously discussed work up for Advanced Therapies -> LVAD.  Will discuss with MD and may be able to begin process while he is here in hospital. Can have HFSW, VAD Coordinator, and Surgeon see in house if so.  2. CAD - Recent LHC in 12/17 with stable coronary disease - Chest pressure improved. Likely demand ischemia with his low output.  3. Ascending aortic aneurysm - 4.1  cm 8/17. No evidence of dissection on CTA 03/17/16.  4. VT storm in 12/17 - Continue amiodarone and Mexiletine - No VT on tele.  5. Low back pain: Began a week ago.  Limiting ambulation.  Needs low back films along with cyclobenzaprine and tramadol.   Coox marginal on milrinone 0.25 mcg/kg/min, but feeling better. Will discuss increase dose with MD.  Creatinine relatively. Stable.  May need home milrinone with his recurrent low output.   Length of Stay: 1  Luane School  03/18/2016, 7:27 AM  Advanced Heart Failure Team Pager [phone] (M-F; 7a - 4p)  Please contact [facility] for night-coverage after hours (4p -7a ) and weekends on [email or web address]  Patient seen with PA, agree with the above note.  NYHA class IIIb-IV symptoms at admission.  Some volume overload but low output seems more of an issue here.  CVP down to 7-8 with diuresis yesterday, co-ox 54%  this morning on milrinone 0.25, higher than yesterday but still marginal.  - Resume po Lasix today.  - Continue IV milrinone for now.  I am not very excited about him going home on milrinone given multiple episodes of VT in 12/17.  I think that it is time to start LVAD workup => will involve LVAD nurse coordinator and will ask surgeon to see.  He quit smoking about a month ago so not transplant candidate yet.  He had PFTs this summer and coronary angiography in 12/17.  His sister lives with him but is not there during the day, he will need to think about caregivers.   - Will likely do a formal RHC this admission.   No further VT.  Dr Graciela HusbandsKlein turned off his LV lead after VT storm given concern for possible pro-arrhythmia.  Will ask EP to see, should we try turning LV lead back on? Not sure if this would be enough to avoid LVAD.   Needs evaluation/treatment of low back pain (suspect musculoskeletal), began about a week ago and limiting at this point.  Will get PT/OT and will start cyclobenzaprine and tramadol prn. No fever and WBCs  only marginally elevated, doubt discitis but does have device in so will send blood cultures.   35 minutes critical care time.   Marca AnconaDalton Sherrol Vicars 03/18/2016 8:53 AM

## 2016-03-18 NOTE — Progress Notes (Signed)
CARDIAC REHAB PHASE I   PRE:  Rate/Rhythm: paced 60  BP:  Supine: 94/50  Sitting:   Standing:    SaO2: 100%RA  MODE:  Ambulation: 150 ft   POST:  Rate/Rhythm: 75 paced  BP:  Supine:   Sitting: 116/68   Standing:    SaO2: 100%RA 1450-1510 Pt walked 150 ft on RA with rolling walker with steady gait. Only complaint is that pt has low back pain. Denied SOB. Tolerated well. To recliner with call bell. LVAD coordinator in with pt.  Difficult to get OOB due to back pain. Needed much assistance.   Luetta Nuttingharlene Cannie Muckle, RN BSN  03/18/2016 3:08 PM

## 2016-03-18 NOTE — Progress Notes (Signed)
During ambulation to the Bathroom pt O2 sats dropped to low 70s. Pt O2 sats are back in high 90s while at rest.

## 2016-03-18 NOTE — Progress Notes (Signed)
CSW referred to meet with patient and discuss LVAD assessment process. CSW met at bedside with patient along with the VAD Coordinator. Patient will discuss with family members tonight and coordinate possible assessment time for tomorrow morning. CSW will return visit in the morning and confirm if family available for assessment. Raquel Sarna, [place], [place]

## 2016-03-18 NOTE — Evaluation (Signed)
Physical Therapy Evaluation Patient Details Name: Crista LuriaLewis R Handlin MRN: [id number] DOB: 03/13/1955 Today's Date: 03/18/2016   History of Present Illness  Pt is a 61 y/o male admitted secondary to worsening SOB, acute CHF exacerbation. 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  Clinical Impression  Pt presented supine in bed with HOB elevated, awake and willing to participate in therapy session. Prior to admission, pt reported that he was mod I with use of RW to ambulate. Pt currently at mod I with bed mobility, min guard for safety with transfers and ambulation. Pt ambulated 100' of RA with use of RW and min guard. He required multiple standing rest breaks secondary to desats as low as 72%. With standing rest breaks and instruction in pursed-lip breathing, SPO2 increased to 99%. Pt would continue to benefit from skilled physical therapy services at this time while admitted to address his below listed limitations in order to improve his overall safety and independence with functional mobility.      Follow Up Recommendations No PT follow up    Equipment Recommendations  None recommended by PT    Recommendations for Other Services       Precautions / Restrictions Restrictions Weight Bearing Restrictions: No      Mobility  Bed Mobility Overal bed mobility: Modified Independent             General bed mobility comments: increased time  Transfers Overall transfer level: Needs assistance Equipment used: Rolling walker (2 wheeled) Transfers: Sit to/from Stand Sit to Stand: Min guard         General transfer comment: pt required increased time, pulls up from sitting on RW with bilateral UEs secondary to back pain, min guard for safety  Ambulation/Gait Ambulation/Gait assistance: Min guard Ambulation Distance (Feet): 100 Feet Assistive device: Rolling walker (2 wheeled) Gait  Pattern/deviations: Step-through pattern;Decreased stride length Gait velocity: decreased Gait velocity interpretation: Below normal speed for age/gender General Gait Details: pt demonstrated safety with use of RW. Pt required multiple standing rest breaks secondary to desats to as low as 72%.  Stairs            Wheelchair Mobility    Modified Rankin (Stroke Patients Only)       Balance Overall balance assessment: Needs assistance Sitting-balance support: Feet supported;No upper extremity supported Sitting balance-Leahy Scale: Good     Standing balance support: During functional activity;Bilateral upper extremity supported Standing balance-Leahy Scale: Poor Standing balance comment: pt reliant on bilateral UEs on RW                             Pertinent Vitals/Pain Pain Assessment: Faces Faces Pain Scale: Hurts little more Pain Location: back Pain Descriptors / Indicators: Sore;Guarding Pain Intervention(s): Monitored during session;Repositioned    Home Living Family/patient expects to be discharged to:: Private residence Living Arrangements: Other relatives Available Help at Discharge: Family;Available PRN/intermittently Type of Home: House Home Access: Stairs to enter Entrance Stairs-Rails: Doctor, general practiceight;Left Entrance Stairs-Number of Steps: 8-10 Home Layout: One level Home Equipment: Walker - 2 wheels;Cane - single point      Prior Function Level of Independence: Independent with assistive device(s)         Comments: pt reported that he ambulates with use of RW secondary to chronic back pain     Hand Dominance        Extremity/Trunk Assessment  Upper Extremity Assessment Upper Extremity Assessment: Overall WFL for tasks assessed    Lower Extremity Assessment Lower Extremity Assessment: Overall WFL for tasks assessed    Cervical / Trunk Assessment Cervical / Trunk Assessment: Other exceptions Cervical / Trunk Exceptions: chronic LBP   Communication   Communication: No difficulties  Cognition Arousal/Alertness: Awake/alert Behavior During Therapy: WFL for tasks assessed/performed Overall Cognitive Status: Within Functional Limits for tasks assessed                      General Comments      Exercises     Assessment/Plan    PT Assessment Patient needs continued PT services  PT Problem List Decreased activity tolerance;Decreased balance;Decreased coordination;Decreased mobility;Cardiopulmonary status limiting activity          PT Treatment Interventions DME instruction;Gait training;Stair training;Functional mobility training;Therapeutic activities;Therapeutic exercise;Balance training;Neuromuscular re-education;Patient/family education    PT Goals (Current goals can be found in the Care Plan section)  Acute Rehab PT Goals Patient Stated Goal: return home PT Goal Formulation: With patient Time For Goal Achievement: 04/01/16 Potential to Achieve Goals: Good    Frequency Min 3X/week   Barriers to discharge        Co-evaluation               End of Session Equipment Utilized During Treatment: Gait belt Activity Tolerance: Patient tolerated treatment well Patient left: in bed;with call bell/phone within reach Nurse Communication: Mobility status         Time: 1610-9604 PT Time Calculation (min) (ACUTE ONLY): 17 min   Charges:   PT Evaluation $PT Eval Low Complexity: 1 Procedure     PT G CodesAlessandra Bevels Areana Kosanke 03/18/2016, 5:30 PM Deborah Chalk, PT, DPT [phone]

## 2016-03-18 NOTE — Progress Notes (Signed)
Initial Encounter with LVAD Team and MCS Introduction:  [name] is a 61 y.o. male whom  has a past medical history of Anxiety; Automatic implantable cardioverter-defibrillator in situ; CAD (coronary artery disease); Chronic systolic CHF (congestive heart failure), NYHA class 3 (HCC) - low output state (12/12/2014); Depression; Dyspnea; History of rheumatic fever (1962); HLD (hyperlipidemia) (mixed); HTN (hypertension); Ischemic cardiomyopathy; Myocardial infarction (1998; 2002; ~ 2010); Paroxysmal ventricular tachycardia (HCC); PVC's (premature ventricular contractions); RBBB; and TIA (transient ischemic attack).. We have been asked to evaluate the patient for advanced therapies which include Left Ventricular Assist Device implantation.   No results found for: [facility]   Lab Results  Component Value Date   HGBA1C 5.5 12/09/2014   Lab Results  Component Value Date   CREATININE 1.35 (H) 03/18/2016   CREATININE 1.43 (H) 03/17/2016   CREATININE 1.36 (H) 03/02/2016    VAD educational packet including "HM II Patient Handbook", "HM II Left Ventricular Assist System" packet, and "[place] HM II Patient Education" reviewed in detail with me and left at bedside for continued reference. Patient education DVD was given to her as well for reference should she want to see more about the equipment at home after reviewing the information I left her.   Explained that LVAD can be implanted for two indications in the setting of advanced left ventricular heart failure treatment:  Bridge to transplant - used for patients who cannot safely wait for heart transplant without this device.  Or   Destination therapy - used for patients until end of life or recovery of heart function.  Discussed that at this point [name] would be considered for Destination therapy should he be deemed an acceptable VAD candidate due to recent smoking cessation.   Provided brief equipment overview of the HeartMate II  pump and discussed placement, surgical procedure, peripheral equipment, life-long coumadin therapy, importance of medication adherence and clinic follow up for as long as patient is living on support, life-style modifications, as well as need for caregiver to be successful with this therapy.   The patient was open to the discussion of this therapy as a possible necessity. He asked appropriate questions throughout the encounter. We discussed the process of the evaluation period and how a [facility] whether he would be an appropriate candidate for therapy or not. Evaluation consent was reviewed and given for reference, although he agreed verbally to the evaluation process. Will discuss with his daughters tonight and sign formal consent in the morning.   Caregiver Support: potentially his daughters (lives w/in 20 miles from him). Sister lives with him but looks after their elderly mother.   Home Inspection Checklist: verified running water, electricity and telephone in the home.   Advised the patient review the materials, contact VAD Team with questions and we will plan on meeting with him tomorrow morning while in the hospital.  At this point after briefly reviewing his chart some points that would exclude the patient or make them high risk to have LVAD surgery include:   None identified at this time   Session Time: 30 min  [name] AlbertsStephanie Dixon, RN VAD Coordinator   Office: [phone] 24/7 VAD Pager: [phone]

## 2016-03-19 DIAGNOSIS — Z95828 Presence of other vascular implants and grafts: Secondary | ICD-10-CM

## 2016-03-19 DIAGNOSIS — I5023 Acute on chronic systolic (congestive) heart failure: Secondary | ICD-10-CM

## 2016-03-19 DIAGNOSIS — Z809 Family history of malignant neoplasm, unspecified: Secondary | ICD-10-CM

## 2016-03-19 DIAGNOSIS — Z8249 Family history of ischemic heart disease and other diseases of the circulatory system: Secondary | ICD-10-CM

## 2016-03-19 DIAGNOSIS — R7881 Bacteremia: Secondary | ICD-10-CM

## 2016-03-19 DIAGNOSIS — Z95810 Presence of automatic (implantable) cardiac defibrillator: Secondary | ICD-10-CM

## 2016-03-19 DIAGNOSIS — I2510 Atherosclerotic heart disease of native coronary artery without angina pectoris: Secondary | ICD-10-CM

## 2016-03-19 DIAGNOSIS — I255 Ischemic cardiomyopathy: Secondary | ICD-10-CM

## 2016-03-19 DIAGNOSIS — M545 Low back pain: Secondary | ICD-10-CM

## 2016-03-19 DIAGNOSIS — F17211 Nicotine dependence, cigarettes, in remission: Secondary | ICD-10-CM

## 2016-03-19 LAB — BLOOD CULTURE ID PANEL (REFLEXED)
ACINETOBACTER BAUMANNII: NOT DETECTED
CANDIDA ALBICANS: NOT DETECTED
CANDIDA KRUSEI: NOT DETECTED
Candida glabrata: NOT DETECTED
Candida parapsilosis: NOT DETECTED
Candida tropicalis: NOT DETECTED
ENTEROBACTERIACEAE SPECIES: NOT DETECTED
ENTEROCOCCUS SPECIES: NOT DETECTED
ESCHERICHIA COLI: NOT DETECTED
Enterobacter cloacae complex: NOT DETECTED
Haemophilus influenzae: NOT DETECTED
KLEBSIELLA OXYTOCA: NOT DETECTED
Klebsiella pneumoniae: NOT DETECTED
LISTERIA MONOCYTOGENES: NOT DETECTED
METHICILLIN RESISTANCE: NOT DETECTED
Neisseria meningitidis: NOT DETECTED
PSEUDOMONAS AERUGINOSA: NOT DETECTED
Proteus species: NOT DETECTED
SERRATIA MARCESCENS: NOT DETECTED
STAPHYLOCOCCUS AUREUS BCID: NOT DETECTED
STREPTOCOCCUS AGALACTIAE: NOT DETECTED
STREPTOCOCCUS PNEUMONIAE: NOT DETECTED
STREPTOCOCCUS PYOGENES: NOT DETECTED
Staphylococcus species: DETECTED — AB
Streptococcus species: NOT DETECTED

## 2016-03-19 LAB — CBC
HCT: 28 % — ABNORMAL LOW (ref 39.0–52.0)
Hemoglobin: 9.4 g/dL — ABNORMAL LOW (ref 13.0–17.0)
MCH: 31.3 pg (ref 26.0–34.0)
MCHC: 33.6 g/dL (ref 30.0–36.0)
MCV: 93.3 fL (ref 78.0–100.0)
PLATELETS: 266 10*3/uL (ref 150–400)
RBC: 3 MIL/uL — ABNORMAL LOW (ref 4.22–5.81)
RDW: 15.3 % (ref 11.5–15.5)
WBC: 5.1 10*3/uL (ref 4.0–10.5)

## 2016-03-19 LAB — TYPE AND SCREEN
ABO/RH(D): A NEG
Antibody Screen: NEGATIVE

## 2016-03-19 LAB — BASIC METABOLIC PANEL
ANION GAP: 8 (ref 5–15)
BUN: 20 mg/dL (ref 6–20)
CALCIUM: 8.8 mg/dL — AB (ref 8.9–10.3)
CO2: 28 mmol/L (ref 22–32)
Chloride: 99 mmol/L — ABNORMAL LOW (ref 101–111)
Creatinine, Ser: 1.04 mg/dL (ref 0.61–1.24)
Glucose, Bld: 89 mg/dL (ref 65–99)
Potassium: 3.9 mmol/L (ref 3.5–5.1)
SODIUM: 135 mmol/L (ref 135–145)

## 2016-03-19 LAB — PSA: PSA: 1.77 ng/mL (ref 0.00–4.00)

## 2016-03-19 LAB — ANTITHROMBIN III: ANTITHROMB III FUNC: 98 % (ref 75–120)

## 2016-03-19 LAB — PREALBUMIN: PREALBUMIN: 17.2 mg/dL — AB (ref 18–38)

## 2016-03-19 LAB — TSH: TSH: 5.519 u[IU]/mL — AB (ref 0.350–4.500)

## 2016-03-19 LAB — COOXEMETRY PANEL
CARBOXYHEMOGLOBIN: 2.2 % — AB (ref 0.5–1.5)
Methemoglobin: 0.7 % (ref 0.0–1.5)
O2 SAT: 66.4 %
TOTAL HEMOGLOBIN: 10.4 g/dL — AB (ref 12.0–16.0)

## 2016-03-19 LAB — URIC ACID: Uric Acid, Serum: 6.2 mg/dL (ref 4.4–7.6)

## 2016-03-19 LAB — T4, FREE: Free T4: 1.22 ng/dL — ABNORMAL HIGH (ref 0.61–1.12)

## 2016-03-19 LAB — LACTATE DEHYDROGENASE: LDH: 143 U/L (ref 98–192)

## 2016-03-19 LAB — ABO/RH: ABO/RH(D): A NEG

## 2016-03-19 LAB — HIV ANTIBODY (ROUTINE TESTING W REFLEX): HIV SCREEN 4TH GENERATION: NONREACTIVE

## 2016-03-19 MED ORDER — SODIUM CHLORIDE 0.9% FLUSH
3.0000 mL | Freq: Two times a day (BID) | INTRAVENOUS | Status: DC
  Administered 2016-03-19 (×2): 3 mL via INTRAVENOUS

## 2016-03-19 MED ORDER — SODIUM CHLORIDE 0.9 % IV SOLN: 250.0000 mL | INTRAVENOUS | Status: DC

## 2016-03-19 MED ORDER — SODIUM CHLORIDE 0.9% FLUSH: 3.0000 mL | Freq: Two times a day (BID) | INTRAVENOUS | Status: DC

## 2016-03-19 MED ORDER — ALPRAZOLAM 0.25 MG PO TABS
0.2500 mg | ORAL_TABLET | Freq: Two times a day (BID) | ORAL | Status: DC | PRN
  Administered 2016-03-19 – 2016-03-30 (×15): 0.25 mg via ORAL
  Filled 2016-03-19 (×15): qty 1

## 2016-03-19 MED ORDER — VANCOMYCIN HCL IN DEXTROSE 1-5 GM/200ML-% IV SOLN
1000.0000 mg | Freq: Three times a day (TID) | INTRAVENOUS | Status: DC
  Filled 2016-03-19: qty 200

## 2016-03-19 MED ORDER — SODIUM CHLORIDE 0.9 % IV SOLN
250.0000 mL | INTRAVENOUS | Status: DC | PRN
Start: 2016-03-19 — End: 2016-03-20

## 2016-03-19 MED ORDER — SODIUM CHLORIDE 0.9 % IV SOLN
INTRAVENOUS | Status: DC
  Administered 2016-03-19: via INTRAVENOUS

## 2016-03-19 MED ORDER — DEXTROSE 5 % IV SOLN
2.0000 g | INTRAVENOUS | Status: DC
  Administered 2016-03-19 – 2016-03-24 (×6): 2 g via INTRAVENOUS
  Filled 2016-03-19 (×6): qty 2

## 2016-03-19 MED ORDER — SODIUM CHLORIDE 0.9% FLUSH: 3.0000 mL | INTRAVENOUS | Status: DC | PRN

## 2016-03-19 NOTE — Progress Notes (Addendum)
Pharmacy Antibiotic Note  [name] is a [age] y.o. male admitted on 03/17/2016 with recurrent low output HF now undergoing LVAD workup. Blood cultures were drawn to r/o discitis given low back pain - now 2/2 positive for CoNS. Pharmacy has been consulted for vancomycin dosing. Renal function stable.  Vancomycin goal trough 15-20  Plan: 1) Vancomycin 1g IV q8 2) Follow renal function, cultures, LOT, level if needed 3) May be able to de-escalate to cefazolin or ceftriaxone soon  ADDENDUM: Ok to switch to ceftriaxone per [name]. Will dose 2g IV q24 for bacteremia.  Height: 5\' 11"  (180.3 cm) Weight: 211 lb 2.7 oz (95.8 kg) IBW/kg (Calculated) : 75.3  Temp (24hrs), Avg:97.7 F (36.5 C), Min:97.4 F (36.3 C), Max:98.3 F (36.8 C)   Recent Labs Lab 03/17/16 0311 03/18/16 0411 03/19/16 0614  WBC 10.8*  --  5.1  CREATININE 1.43* 1.35* 1.04    Estimated Creatinine Clearance: 85.9 mL/min (by C-G formula based on SCr of 1.04 mg/dL).    No Known Allergies  Antimicrobials this admission: 1/25 Vancomycin >> 1/25 (didn't receive any doses) 1/25 Ceftriaxone >>  Dose adjustments this admission: n/a  Microbiology results: 1/23 MRSA PCR negative 1/24 blood cx: 2/2 GPC, BCID CoNS  Thank you for allowing pharmacy to be a part of this patient's care.  [name], Myrtice Lowdermilk Sue 03/19/2016 9:36 AM

## 2016-03-19 NOTE — Progress Notes (Signed)
OT Cancellation Note  Patient Details Name: [name] MRN: [id number] DOB: 06/05/1955   Cancelled Treatment:    Reason Eval/Treat Not Completed: Patient at procedure or test/ unavailable (discussing LVAD placement). Will follow up for OT eval as time allows.  Gaye AlkenBailey A Kamia Insalaco M.S., OTR/L Pager: [phone]  03/19/2016, 10:12 AM

## 2016-03-19 NOTE — Progress Notes (Signed)
PHARMACY - PHYSICIAN COMMUNICATION CRITICAL VALUE ALERT - BLOOD CULTURE IDENTIFICATION (BCID)  Results for orders placed or performed during the hospital encounter of 03/17/16  Blood Culture ID Panel (Reflexed) (Collected: 03/18/2016 10:06 AM)  Result Value Ref Range   Enterococcus species NOT DETECTED NOT DETECTED   Listeria monocytogenes NOT DETECTED NOT DETECTED   Staphylococcus species DETECTED (A) NOT DETECTED   Staphylococcus aureus NOT DETECTED NOT DETECTED   Methicillin resistance NOT DETECTED NOT DETECTED   Streptococcus species NOT DETECTED NOT DETECTED   Streptococcus agalactiae NOT DETECTED NOT DETECTED   Streptococcus pneumoniae NOT DETECTED NOT DETECTED   Streptococcus pyogenes NOT DETECTED NOT DETECTED   Acinetobacter baumannii NOT DETECTED NOT DETECTED   Enterobacteriaceae species NOT DETECTED NOT DETECTED   Enterobacter cloacae complex NOT DETECTED NOT DETECTED   Escherichia coli NOT DETECTED NOT DETECTED   Klebsiella oxytoca NOT DETECTED NOT DETECTED   Klebsiella pneumoniae NOT DETECTED NOT DETECTED   Proteus species NOT DETECTED NOT DETECTED   Serratia marcescens NOT DETECTED NOT DETECTED   Haemophilus influenzae NOT DETECTED NOT DETECTED   Neisseria meningitidis NOT DETECTED NOT DETECTED   Pseudomonas aeruginosa NOT DETECTED NOT DETECTED   Candida albicans NOT DETECTED NOT DETECTED   Candida glabrata NOT DETECTED NOT DETECTED   Candida krusei NOT DETECTED NOT DETECTED   Candida parapsilosis NOT DETECTED NOT DETECTED   Candida tropicalis NOT DETECTED NOT DETECTED    Name of physician (or Provider) Contacted: [name] / [name]  Changes to prescribed antibiotics required: Will start vancomycin per pharmacy protocol.  [name], [name] 03/19/2016  9:35 AM

## 2016-03-19 NOTE — Progress Notes (Signed)
Advanced Heart Failure Rounding Note  Primary Cardiologist: Dr Jens Somrenshaw Primary HF: [name]   Subjective:    Admitted from ED 03/17/16 with recurrent low output HF with NYHA IIIb-IV symptoms.   Initial Coox 30%, however by time stamps this was PRIOR to PICC placement. So likely inaccurate/peripheral draw.   Coox 66.4% this am on 0.25 mcg/kg/min.    Feeling good this am. Walked halls without dyspnea yesterday. Hasn't walked yet today.   Out 1.1 L though weight stable back on lasix 40 mg BID po. . Creatinine stable. BUN with gentle uptrend.   Objective:   Weight Range: 211 lb 2.7 oz (95.8 kg) Body mass index is 29.45 kg/m.   Vital Signs:   Temp:  [97.4 F (36.3 C)-98.3 F (36.8 C)] 97.4 F (36.3 C) (01/25 0300) Pulse Rate:  [59-75] 69 (01/25 0300) Resp:  [13-22] 14 (01/25 0300) BP: (92-132)/(53-73) 105/58 (01/25 0300) SpO2:  [99 %-100 %] 100 % (01/24 2150) Weight:  [211 lb 2.7 oz (95.8 kg)] 211 lb 2.7 oz (95.8 kg) (01/25 0300) Last BM Date: 03/16/16  Weight change: Filed Weights   03/17/16 1708 03/18/16 0423 03/19/16 0300  Weight: 211 lb 6.4 oz (95.9 kg) 211 lb 11.2 oz (96 kg) 211 lb 2.7 oz (95.8 kg)    Intake/Output:   Intake/Output Summary (Last 24 hours) at 03/19/16 0721 Last data filed at 03/19/16 0415  Gross per 24 hour  Intake              312 ml  Output             1500 ml  Net            -1188 ml     Physical Exam: General: Well appearing. NAD.    HEENT: Normal.  Neck: supple. JVD 6-7 cm cm. Carotids 2+ bilat; no bruits. No thyromegaly or nodule noted.  Cor: PMI nondisplaced. Regular. No M/G/R Lungs: CTAB, normal effort Abdomen: soft, NT, ND, no HSM. No bruits or masses. +BS  Extremities: no cyanosis, clubbing, rash. Trace ankle edema. Slightly cool.  Neuro: alert & oriented x 3, cranial nerves grossly intact. moves all 4 extremities w/o difficulty. Affect pleasant.  Telemetry: Reviewed, A sensed V paced.   Labs: CBC  Recent Labs  03/17/16 0311 03/19/16 0614  WBC 10.8* 5.1  NEUTROABS 8.3*  --   HGB 11.2* 9.4*  HCT 34.1* 28.0*  MCV 94.2 93.3  PLT 260 266   Basic Metabolic Panel  Recent Labs  03/17/16 0311 03/18/16 0411  NA 135 135  K 4.4 4.2  CL 101 102  CO2 26 27  GLUCOSE 96 98  BUN 27* 28*  CREATININE 1.43* 1.35*  CALCIUM 9.2 8.6*   Liver Function Tests  Recent Labs  03/17/16 0311  AST 17  ALT 17  ALKPHOS 47  BILITOT 0.8  PROT 6.9  ALBUMIN 3.6    Recent Labs  03/17/16 0311  LIPASE 14   Cardiac Enzymes No results for input(s): CKTOTAL, CKMB, CKMBINDEX, TROPONINI in the last 72 hours.  BNP: BNP (last 3 results)  Recent Labs  01/13/16 1008 01/27/16 1155 03/17/16 0311  BNP 256.0* 595.7* 894.5*    ProBNP (last 3 results) No results for input(s): PROBNP in the last 8760 hours.   D-Dimer No results for input(s): DDIMER in the last 72 hours. Hemoglobin A1C No results for input(s): HGBA1C in the last 72 hours. Fasting Lipid Panel No results for input(s): CHOL, HDL, LDLCALC, TRIG, CHOLHDL,  LDLDIRECT in the last 72 hours. Thyroid Function Tests No results for input(s): TSH, T4TOTAL, T3FREE, THYROIDAB in the last 72 hours.  Invalid input(s): FREET3  Other results:     Imaging/Studies:  Dg Lumbar Spine 2-3 Views  Result Date: 03/18/2016 CLINICAL DATA:  Acute low back pain without known injury. EXAM: LUMBAR SPINE - 2-3 VIEW COMPARISON:  CT scan of December 16, 2015. FINDINGS: No fracture or spondylolisthesis is noted. Atherosclerosis of abdominal aorta is noted. Mild degenerative disc disease is noted at L3-4. Remaining disc spaces appear intact. IMPRESSION: Mild degenerative disc disease at L3-4. No acute abnormality seen in the lumbar spine. Aortic atherosclerosis. Electronically Signed   By: Lupita Raider, M.D.   On: 03/18/2016 11:36      Medications:     Scheduled Medications: . amiodarone  200 mg Oral Daily  . aspirin EC  81 mg Oral Daily  . atorvastatin  40  mg Oral q1800  . clopidogrel  75 mg Oral Daily  . digoxin  0.125 mg Oral QODAY  . enoxaparin (LOVENOX) injection  40 mg Subcutaneous Q24H  . famotidine  20 mg Oral BID  . furosemide  40 mg Oral BID  . losartan  25 mg Oral Daily  . magnesium oxide  400 mg Oral Daily  . mexiletine  200 mg Oral Q12H  . nicotine  21 mg Transdermal Daily  . PARoxetine  10 mg Oral Daily  . sodium chloride flush  3 mL Intravenous Q12H  . spironolactone  12.5 mg Oral QHS    Infusions: . milrinone 0.25 mcg/kg/min (03/18/16 0444)    PRN Medications: sodium chloride, acetaminophen, albuterol, ALPRAZolam, cyclobenzaprine, hydrOXYzine, nitroGLYCERIN, ondansetron (ZOFRAN) IV, oxyCODONE-acetaminophen, sodium chloride flush, sodium chloride flush, traMADol   Assessment/Plan   1. Acute on chronic systolic CHF - Echo 11/2015 LVEF 15% 2/2 ICM - Volume status stable on exam. CVP remains 6-7.  - Continue po lasix 40 mg BID.  - Coox 66.4% this am on milrinone 0.25 mcg/kg/min. Will wean to 0.125 mcg/mg/min now, and stop this evening for RHC OFF milrinone in am.  - No BB with low output.   - Continue spiro 25 mg daily - Continue losartan 25 mg daily.  No room this am to uptitrate or switch to [employer]. - Continue digoxin 0.125 mg daily. Level 0.2 on admit.  - On going work up for LVAD.  Surgeon to see this am. VAD coordinator has seen. HFSW to plan family meeting today.   2. CAD - Recent LHC in 12/17 with stable coronary disease - Chest pressure improved. Likely demand ischemia with his low output.  - No change to current plan.  3. Ascending aortic aneurysm - 4.1 cm 8/17. No evidence of dissection on CTA 03/17/16.  4. VT storm in 12/17 - Continue amiodarone and Mexiletine - No VT on tele. -  EP has seen 03/18/16. No indication to re-activate LV lead at this time. CRT would be unlikely to provide enough benefit to avoid advanced therapies and there is a strong chance it could trigger VT again.   5. Low back pain:    - Lumbar 2-3 view with mild degenerative disc disease at L3-L4.  - Suspect pain may be purely muscular.  - Continue flexeril and tramadol prn.  - Blood Cx to r/o discitis pending. 6. VAD work up - VAD coordinator has seen.  No high risk factors identified at this time. HFSW to see today and schedule family meeting.  Have also asked Surgeon  to see this admission.   Wean milrinone off today.  Plan on RHC OFF inotrope support first thing tomorrow am. Orders placed.   Length of Stay: 2  Luane School  03/19/2016, 7:21 AM  Advanced Heart Failure Team Pager [phone] (M-F; 7a - 4p)  Please contact [facility] for night-coverage after hours (4p -7a ) and weekends on [email or web address]  Patient seen with PA, agree with the above note.    In addition, blood culture returned today with gram positive cocci in clusters.  Done because of low back pain and presence of device to screen for bacteremia from discitis.  Low back plain films unremarkable.  Will await speciation, given lack of fever and normal WBCs may be contaminant.  May need to involve ID and further imaging based on result of culture.  Will start vancomycin per pharmacy consult.   Will plan weaning milrinone as above with RHC tomorrow.  Will avoid leaving [place] in given bacteremia so will go from right femoral.   Ongoing LVAD workup.    Marca Ancona 03/19/2016 9:08 AM

## 2016-03-19 NOTE — Consult Note (Signed)
[facility]       Reason for Consult: Bacteremia    Referring Physician: Dr. Shirlee Latch  Active Problems:   Systolic CHF, acute on chronic (HCC)   . amiodarone  200 mg Oral Daily  . aspirin EC  81 mg Oral Daily  . atorvastatin  40 mg Oral q1800  . cefTRIAXone (ROCEPHIN)  IV  2 g Intravenous Q24H  . clopidogrel  75 mg Oral Daily  . digoxin  0.125 mg Oral QODAY  . enoxaparin (LOVENOX) injection  40 mg Subcutaneous Q24H  . famotidine  20 mg Oral BID  . furosemide  40 mg Oral BID  . losartan  25 mg Oral Daily  . magnesium oxide  400 mg Oral Daily  . mexiletine  200 mg Oral Q12H  . nicotine  21 mg Transdermal Daily  . PARoxetine  10 mg Oral Daily  . sodium chloride flush  3 mL Intravenous Q12H  . sodium chloride flush  3 mL Intravenous Q12H  . spironolactone  12.5 mg Oral QHS    Recommendations: Continue ceftriaxone TEE for ? ICD infection Repeat blood cultures    Assessment: He has 2/2 blood cultures with CoNS drawn for concern for discitis.  It is unusual for blood cultures to be positive in discitis, particularly without systemic symptoms and I am more concerned with ICD site infection, IE, particularly with recent concerns noted by Dr. Graciela Husbands and Dr. Johney Frame recently with ICD pocket concerns.  I would not consider this a contaminate with the ICD and b/c it is now 2/2.  Line infection also possible but picc just recently placed.    Antibiotics: ceftriaxone  HPI: [name] is a 61 y.o. male with advanced heart failure with ICCM, CAD, recent smoker, who presented to HF team with SOB.  Picc line was placed and he was started on milrinone.  He also was complaining of new onset back pain that started on admission and described as intermittent and a sharp, shooting pain.  No bowel or bladder dysfunction.  There was concern for discitis and xray done and blood cultures sent.  Back pain in his lower back, along the muscular region across the spine.     xray independently reviewed and no compression fx.   Review of Systems:  Constitutional: negative for fevers, chills and weight loss Respiratory: negative for cough or sputum Cardiovascular: negative for chest pressure/discomfort Gastrointestinal: negative for nausea and diarrhea Genitourinary: negative for frequency Integument/breast: negative for rash Musculoskeletal: negative for muscle weakness All other systems reviewed and are negative    Past Medical History:  Diagnosis Date  . Anxiety   . Automatic implantable cardioverter-defibrillator in situ    a. Medtronic ICD.  [name] CAD (coronary artery disease)    a.  Severe LAD stenosis 2/2 acute thrombus - BMS 2009;  b. 06/01/11 Cath - LAD 20 isr, 16m (3.0x16 Veri-flex BMS), OM1 100p, EF 20-25%;  c. 07/2012 Abnl Cardiolite;  d. 08/2012 Cath/PCI: LM nl, LAD patent stents, D2 80-90 (jailed), LCX 90 p/m (4.0x24 Promus Premier DES), OM1 100, OM2 80-90p (3.0x20 Promus Premier DES), RCA patent mid stent, 40d, EF 25%.  e. 10/29/15 LHC stable disease with patent stents  . Chronic systolic CHF (congestive heart failure), NYHA class 3 (HCC) - low output state 12/12/2014  . Depression   . Dyspnea   . History of rheumatic fever 1962  . HLD (hyperlipidemia) mixed  . HTN (hypertension)   . Ischemic cardiomyopathy  a.  EF 30-35% 2010;  b.  EF 20-25% by LV gram 08/2012. c. EF 15% by echo 11/2014.  [name]. Myocardial infarction 1998; 2002; ~ 2010  . Paroxysmal ventricular tachycardia (HCC)    a. VFlutter  CL 210 msec  Rx shock 04/2011  . PVC's (premature ventricular contractions)   . RBBB   . TIA (transient ischemic attack)    a. Dx 11/2014, continued on ASA/Plavix.    Social History  Substance Use Topics  . Smoking status: Former Smoker    Packs/day: 1.00    Years: 47.00    Types: Cigarettes    Quit date: 11/20/2015  . Smokeless tobacco: Never Used  . Alcohol use 3.6 oz/week    6 Cans of beer per week    Family History  Problem Relation Age of  Onset  . Cancer Father   . Cancer Sister     twin sister and only one has cancer, unknown what kind  . Hypertension Brother   . Cancer      family hx  . Heart attack Neg Hx   . Stroke Neg Hx     No Known Allergies  Physical Exam: Constitutional: in no apparent distress and alert  Vitals:   03/19/16 1258 03/19/16 1529  BP: (!) 105/58 (!) 89/46  Pulse: 70 67  Resp: 16 13  Temp:  97.8 F (36.6 C)   EYES: anicteric ENMT: no thrush Cardiovascular: Cor RRR Respiratory: CTA B; normal respiratory effort GI: Bowel sounds are normal, liver is not enlarged, spleen is not enlarged Musculoskeletal: no pedal edema noted Skin: negatives: no rash; ICD pocket with same area of concern with necrotic appearing skin, denuded Hematologic: no cervical lad  Lab Results  Component Value Date   WBC 5.1 03/19/2016   HGB 9.4 (L) 03/19/2016   HCT 28.0 (L) 03/19/2016   MCV 93.3 03/19/2016   PLT 266 03/19/2016    Lab Results  Component Value Date   CREATININE 1.04 03/19/2016   BUN 20 03/19/2016   NA 135 03/19/2016   K 3.9 03/19/2016   CL 99 (L) 03/19/2016   CO2 28 03/19/2016    Lab Results  Component Value Date   ALT 17 03/17/2016   AST 17 03/17/2016   ALKPHOS 47 03/17/2016     Microbiology: Recent Results (from the past 240 hour(s))  MRSA PCR Screening     Status: None   Collection Time: 03/17/16  5:15 PM  Result Value Ref Range Status   MRSA by PCR NEGATIVE NEGATIVE Final    Comment:        The GeneXpert MRSA Assay (FDA approved for NASAL specimens only), is one component of a comprehensive MRSA colonization surveillance program. It is not intended to diagnose MRSA infection nor to guide or monitor treatment for MRSA infections.   Culture, blood (Routine X 2) w Reflex to ID Panel     Status: None (Preliminary result)   Collection Time: 03/18/16 10:00 AM  Result Value Ref Range Status   Specimen Description BLOOD LEFT ANTECUBITAL  Final   Special Requests IN PEDIATRIC  BOTTLE  2CC  Final   Culture  Setup Time   Final    GRAM POSITIVE COCCI IN CLUSTERS IN PEDIATRIC BOTTLE    Culture GRAM POSITIVE COCCI  Final   Report Status PENDING  Incomplete  Culture, blood (Routine X 2) w Reflex to ID Panel     Status: Abnormal (Preliminary result)   Collection Time: 03/18/16 10:06 AM  Result  Value Ref Range Status   Specimen Description BLOOD LEFT HAND  Final   Special Requests IN PEDIATRIC BOTTLE  3CC  Final   Culture  Setup Time   Final    GRAM POSITIVE COCCI IN CLUSTERS IN PEDIATRIC BOTTLE CRITICAL RESULT CALLED TO, READ BACK BY AND VERIFIED WITH: PHARMD A JOHNSTON [id number] 0904AM MLM    Culture STAPHYLOCOCCUS SPECIES (COAGULASE NEGATIVE) (A)  Final   Report Status PENDING  Incomplete  Blood Culture ID Panel (Reflexed)     Status: Abnormal   Collection Time: 03/18/16 10:06 AM  Result Value Ref Range Status   Enterococcus species NOT DETECTED NOT DETECTED Final   Listeria monocytogenes NOT DETECTED NOT DETECTED Final   Staphylococcus species DETECTED (A) NOT DETECTED Final    Comment: CRITICAL RESULT CALLED TO, READ BACK BY AND VERIFIED WITH: PHARMD A JOHNSTON [id number] 0904AM MLM    Staphylococcus aureus NOT DETECTED NOT DETECTED Final   Methicillin resistance NOT DETECTED NOT DETECTED Final   Streptococcus species NOT DETECTED NOT DETECTED Final   Streptococcus agalactiae NOT DETECTED NOT DETECTED Final   Streptococcus pneumoniae NOT DETECTED NOT DETECTED Final   Streptococcus pyogenes NOT DETECTED NOT DETECTED Final   Acinetobacter baumannii NOT DETECTED NOT DETECTED Final   Enterobacteriaceae species NOT DETECTED NOT DETECTED Final   Enterobacter cloacae complex NOT DETECTED NOT DETECTED Final   Escherichia coli NOT DETECTED NOT DETECTED Final   Klebsiella oxytoca NOT DETECTED NOT DETECTED Final   Klebsiella pneumoniae NOT DETECTED NOT DETECTED Final   Proteus species NOT DETECTED NOT DETECTED Final   Serratia marcescens NOT DETECTED NOT DETECTED Final    Haemophilus influenzae NOT DETECTED NOT DETECTED Final   Neisseria meningitidis NOT DETECTED NOT DETECTED Final   Pseudomonas aeruginosa NOT DETECTED NOT DETECTED Final   Candida albicans NOT DETECTED NOT DETECTED Final   Candida glabrata NOT DETECTED NOT DETECTED Final   Candida krusei NOT DETECTED NOT DETECTED Final   Candida parapsilosis NOT DETECTED NOT DETECTED Final   Candida tropicalis NOT DETECTED NOT DETECTED Final    Markhi Kleckner, Molly Maduro, MD [facility] [email or web address] [phone] pager  [phone] cell 03/19/2016, 5:08 PM

## 2016-03-19 NOTE — Care Management Note (Addendum)
Case Management Note  Patient Details  Name: [name] MRN: [id number] Date of Birth: 08/05/1955  Subjective/Objective:  Pt presented for worsening SOB and weight gain. Initiated on IV Lasix and IV Milrinone gtt. Per MD notes plan to wean Milrinone and plan for RHC on 03-20-16. Pt is from home with sister in [place].                     Action/Plan: LVAD workup in process. CM will continue to monitor for home needs.   Expected Discharge Date:                  Expected Discharge Plan:  Home w Home Health Services  In-House Referral:  NA  Discharge planning Services  CM Consult  Post Acute Care Choice:    Choice offered to:     DME Arranged:    DME Agency:     HH Arranged:    HH Agency:     Status of Service:  In process, will continue to follow  If discussed at Long Length of Stay Meetings, dates discussed:  03-24-16 Additional Comments: 1248 03-23-16 Tomi BambergerBrenda Graves-Bigelow, RN,BSN [phone] CM did fax information to [employer]. Awaiting Insurance Authorization at this time. ICD pocket infection - for extraction today.  CM will continue to monitor.  [name], [name] Arth Kaye, RN 03/19/2016, 2:44 PM

## 2016-03-19 NOTE — Consult Note (Signed)
[address]       [place] [address]             [phone]       Cardiothoracic Surgery Consultation  Reason for Consult: Ischemic cardiomyopathy with LVEF 15% and NYHA class IIIb-IV heart failure. Referring Physician: Dr. Hanley Hays [name] is an 61 y.o. male.  HPI:   The patient is a 61 year old gentleman with a long history of coronary artery disease, s/p multiple PCI's with stents, ischemic cardiomyopathy with chronic systolic CHF, VT, s/p ICD and revision in 11/2015 to a BiV device. He came to [facility] by ambulance two days ago due to worsening SOB that started the week before but progressed. He says that he really did not feel that bad but knew something was wrong and that he needed to be seen. He had been having some orthopnea and PND as well as some lightheadedness with standing. His weight was increasing but he did not notice any significant swelling in his legs. On presentation he had a soft BP and creatinine that was up to 1.53 from his normal of 1.0 in Dec 2017. He had a Co-ox drawn that was 30's and he was started on Milrinone 0.25. It turns out that the time of the Co-ox was before the PICC was inserted and it was probably drawn peripherally. His Co-ox this am was 66.4 on Milrinone. It was turned down to 0.125. His creat was back to 1.04 this am and he has been diuresing.  He says that he has been eating well and maintaining weight. He denies any GI blood loss. He was on coumadin in the distant past for CAD according to him. He has been on Plavix and ASA for years.  He had two positive blood cultures for Coag neg Staph on admission. He denies any fever or chills. He does report that he had some spontaneous drainage from the lateral edge of his ICD incision on Thanksgiving day 2017. He says that this was a lot of caramel-colored drainage that did not look like pus. It drained for a while that day and then resolved and he has not had any further drainage.    His most significant problem according to him is severe lower back pain that started Monday a week ago. This is sharp and goes across the lower back at the belt line. He has a long history of back problems and says that he has had this same pain in the past that resolved with time. He woke up with the pain this time with no inciting event.  He had VT storm in 01/2016 and was shocked 40+ times. It was felt that the ventricular lead may be pacing him into VT and it was turned off. He has some PTSD since that episode and became emotional just talking about it.   He currently lives with his sister in [place] since getting divorced. He is unemployed. His sister is gone most of the day sitting with their mother who lives with his brother. His brother is unemployed also. His sister and brother have never been married. The patient has three daughters who live within 30 minutes.   Past Medical History:  Diagnosis Date  . Anxiety   . Automatic implantable cardioverter-defibrillator in situ    a. Medtronic ICD.  [name] CAD (coronary artery disease)    a.  Severe LAD stenosis 2/2 acute thrombus - BMS 2009;  b. 06/01/11 Cath -  LAD 20 isr, 79m(3.0x16 Veri-flex BMS), OM1 100p, EF 20-25%;  c. 07/2012 Abnl Cardiolite;  d. 08/2012 Cath/PCI: LM nl, LAD patent stents, D2 80-90 (jailed), LCX 90 p/m (4.0x24 Promus Premier DES), OM1 100, OM2 80-90p (3.0x20 Promus Premier DES), RCA patent mid stent, 40d, EF 25%.  e. 10/29/15 LHC stable disease with patent stents  . Chronic systolic CHF (congestive heart failure), NYHA class 3 (HCC) - low output state 12/12/2014  . Depression   . Dyspnea   . History of rheumatic fever 1962  . HLD (hyperlipidemia) mixed  . HTN (hypertension)   . Ischemic cardiomyopathy    a.  EF 30-35% 2010;  b.  EF 20-25% by LV gram 08/2012. c. EF 15% by echo 11/2014.  .Marland KitchenMyocardial infarction 1998; 2002; ~ 2010  . Paroxysmal ventricular tachycardia (HCC)    a. VFlutter  CL 210 msec  Rx shock 04/2011  . PVC's  (premature ventricular contractions)   . RBBB   . TIA (transient ischemic attack)    a. Dx 11/2014, continued on ASA/Plavix.    Past Surgical History:  Procedure Laterality Date  . CARDIAC CATHETERIZATION N/A 10/29/2015   Procedure: Right/Left Heart Cath and Coronary Angiography;  Surgeon: Peter M JMartinique MD;  [facility];  Service: Cardiovascular;  Laterality: N/A;  . CARDIAC DEFIBRILLATOR PLACEMENT     MDT single chamber primary prevention ICD implanted by Dr KCaryl Comesas part of MASTER study  . EP IMPLANTABLE DEVICE N/A 12/11/2015   Procedure: BiV Upgrade;  Surgeon: SDeboraha Sprang MD;  [facility];  Service: Cardiovascular;  Laterality: N/A;  . EP IMPLANTABLE DEVICE N/A 12/11/2015   Procedure: Pocket Revision/Relocation;  Surgeon: SDeboraha Sprang MD;  [facility];  Service: Cardiovascular;  Laterality: N/A;  . EP IMPLANTABLE DEVICE N/A 12/11/2015   Procedure: Lead Revision/Repair;  Surgeon: SDeboraha Sprang MD;  [place];  Service: Cardiovascular;  Laterality: N/A;  . HERNIA REPAIR     "w/gallbladder OR" (08/29/2012)  . LAPAROSCOPIC CHOLECYSTECTOMY    . LEFT HEART CATHETERIZATION WITH CORONARY ANGIOGRAM N/A 06/08/2011   Procedure: LEFT HEART CATHETERIZATION WITH CORONARY ANGIOGRAM;  Surgeon: DJolaine Artist MD;  [facility];  Service: Cardiovascular;  Laterality: N/A;  . PERCUTANEOUS CORONARY STENT INTERVENTION (PCI-S) N/A 06/01/2011   Procedure: PERCUTANEOUS CORONARY STENT INTERVENTION (PCI-S);  Surgeon: Peter M JMartinique MD;  [facility];  Service: Cardiovascular;  Laterality: N/A;  . PERCUTANEOUS CORONARY STENT INTERVENTION (PCI-S) N/A 08/29/2012   Procedure: PERCUTANEOUS CORONARY STENT INTERVENTION (PCI-S);  Surgeon: Peter M JMartinique MD;  [facility];  Service: Cardiovascular;  Laterality: N/A;  . PERIPHERAL VASCULAR CATHETERIZATION  12/11/2015   Procedure: Upper Extremity Venography;  Surgeon:  SDeboraha Sprang MD;  [facility];  Service: Cardiovascular;;    Family History  Problem Relation Age of Onset  . Cancer Father   . Cancer Sister     twin sister and only one has cancer, unknown what kind  . Hypertension Brother   . Cancer      family hx  . Heart attack Neg Hx   . Stroke Neg Hx     Social History:  reports that he quit smoking about 3 months ago. His smoking use included Cigarettes. He has a 47.00 pack-year smoking history. He has never used smokeless tobacco. He reports that he drinks about 3.6 oz of alcohol per week . He reports that  he does not use drugs.  Allergies: No Known Allergies  Medications:  I have reviewed the patient's current medications. Prior to Admission:  Prescriptions Prior to Admission  Medication Sig Dispense Refill Last Dose  . albuterol (PROAIR HFA) 108 (90 Base) MCG/ACT inhaler Inhale 2 puffs into the lungs every 4 (four) hours as needed for wheezing or shortness of breath.   unk  . ALPRAZolam (XANAX) 0.25 MG tablet Take 1 tablet (0.25 mg total) by mouth at bedtime as needed for anxiety. F/u with PCP for refills. 30 tablet 0 unk  . amiodarone (PACERONE) 200 MG tablet Take 1 tablet (200 mg total) by mouth daily. 30 tablet 3 03/16/2016 at Unknown time  . aspirin EC 81 MG tablet Take 81 mg by mouth daily.   03/16/2016 at Unknown time  . atorvastatin (LIPITOR) 40 MG tablet Take 1 tablet (40 mg total) by mouth daily. 30 tablet 5 03/16/2016 at Unknown time  . bisoprolol (ZEBETA) 5 MG tablet Take 0.5 tablets (2.5 mg total) by mouth at bedtime. 15 tablet 11 03/16/2016 at 1900  . clopidogrel (PLAVIX) 75 MG tablet Take 1 tablet (75 mg total) by mouth daily. 30 tablet 5 03/16/2016 at Unknown time  . cyclobenzaprine (FLEXERIL) 10 MG tablet Take 1 tablet (10 mg total) by mouth 2 (two) times daily as needed for muscle spasms. 20 tablet 0 Past Week at Unknown time  . digoxin (LANOXIN) 0.125 MG tablet Take one tablet (0.125 mg) by mouth once every  other day 30 tablet 3 Past Week at Unknown time  . famotidine (PEPCID) 20 MG tablet Take 1 tablet (20 mg total) by mouth 2 (two) times daily. 60 tablet 6 03/16/2016 at Unknown time  . furosemide (LASIX) 40 MG tablet Take 1 tablet (40 mg total) by mouth See admin instructions. Take 1 tablet (40 mg) by mouth daily, may also take an additional tablet as needed for fluid buildup 30 tablet 11 03/16/2016 at Unknown time  . hydrOXYzine (VISTARIL) 25 MG capsule Take 25 mg by mouth at bedtime as needed (sleep).   unk  . losartan (COZAAR) 25 MG tablet Take 1 tablet (25 mg total) by mouth daily. 30 tablet 6 03/16/2016 at Unknown time  . magnesium oxide (MAG-OX) 400 (241.3 Mg) MG tablet Take 1 tablet (400 mg total) by mouth daily. 30 tablet 6 03/16/2016 at Unknown time  . mexiletine (MEXITIL) 200 MG capsule Take 1 capsule (200 mg total) by mouth every 12 (twelve) hours. 60 capsule 3 03/16/2016 at Unknown time  . nicotine (NICODERM CQ - DOSED IN MG/24 HOURS) 21 mg/24hr patch Place 21 mg onto the skin daily.   03/16/2016 at Unknown time  . nitroGLYCERIN (NITROSTAT) 0.4 MG SL tablet Place 1 tablet (0.4 mg total) under the tongue every 5 (five) minutes as needed. For chest pain 25 tablet 11 unk  . oxyCODONE-acetaminophen (PERCOCET/ROXICET) 5-325 MG tablet Take 1-2 tablets by mouth every 6 (six) hours as needed for severe pain. 10 tablet 0 Past Week at Unknown time  . PARoxetine (PAXIL) 10 MG tablet Take 1 tablet (10 mg total) by mouth daily. 30 tablet 3 03/16/2016 at Unknown time  . potassium chloride SA (K-DUR,KLOR-CON) 20 MEQ tablet Take 1 tablet (20 mEq total) by mouth daily. 30 tablet 3 03/16/2016 at Unknown time  . spironolactone (ALDACTONE) 25 MG tablet Take 1 tablet (25 mg total) by mouth daily. (Patient not taking: Reported on 03/10/2016) 30 tablet 5 Not Taking at Unknown time   Scheduled: . amiodarone  200 mg Oral Daily  . aspirin EC  81 mg Oral Daily  . atorvastatin  40 mg Oral q1800  . cefTRIAXone (ROCEPHIN)  IV   2 g Intravenous Q24H  . clopidogrel  75 mg Oral Daily  . digoxin  0.125 mg Oral QODAY  . enoxaparin (LOVENOX) injection  40 mg Subcutaneous Q24H  . famotidine  20 mg Oral BID  . furosemide  40 mg Oral BID  . losartan  25 mg Oral Daily  . magnesium oxide  400 mg Oral Daily  . mexiletine  200 mg Oral Q12H  . nicotine  21 mg Transdermal Daily  . PARoxetine  10 mg Oral Daily  . sodium chloride flush  3 mL Intravenous Q12H  . sodium chloride flush  3 mL Intravenous Q12H  . spironolactone  12.5 mg Oral QHS   Continuous: . sodium chloride     IHW:TUUEKC chloride, acetaminophen, albuterol, ALPRAZolam, cyclobenzaprine, hydrOXYzine, nitroGLYCERIN, ondansetron (ZOFRAN) IV, oxyCODONE-acetaminophen, sodium chloride flush, sodium chloride flush, sodium chloride flush, traMADol Anti-infectives    Start     Dose/Rate Route Frequency Ordered Stop   03/19/16 1000  vancomycin (VANCOCIN) IVPB 1000 mg/200 mL premix  Status:  Discontinued     1,000 mg 200 mL/hr over 60 Minutes Intravenous Every 8 hours 03/19/16 0915 03/19/16 0954   03/19/16 1000  cefTRIAXone (ROCEPHIN) 2 g in dextrose 5 % 50 mL IVPB     2 g 100 mL/hr over 30 Minutes Intravenous Every 24 hours 03/19/16 0954        Results for orders placed or performed during the hospital encounter of 03/17/16 (from the past 48 hour(s))  Cooxemetry Panel (carboxy, met, total hgb, O2 sat)     Status: Abnormal   Collection Time: 03/17/16 11:35 PM  Result Value Ref Range   Total hemoglobin 9.7 (L) 12.0 - 16.0 g/dL   O2 Saturation 63.9 %   Carboxyhemoglobin 1.8 (H) 0.5 - 1.5 %   Methemoglobin 1.3 0.0 - 1.5 %  Basic metabolic panel     Status: Abnormal   Collection Time: 03/18/16  4:11 AM  Result Value Ref Range   Sodium 135 135 - 145 mmol/L   Potassium 4.2 3.5 - 5.1 mmol/L   Chloride 102 101 - 111 mmol/L   CO2 27 22 - 32 mmol/L   Glucose, Bld 98 65 - 99 mg/dL   BUN 28 (H) 6 - 20 mg/dL   Creatinine, Ser 1.35 (H) 0.61 - 1.24 mg/dL   Calcium 8.6  (L) 8.9 - 10.3 mg/dL   GFR calc non Af Amer 54 (L) >60 mL/min   GFR calc Af Amer >60 >60 mL/min    Comment: (NOTE) The eGFR has been calculated using the CKD EPI equation. This calculation has not been validated in all clinical situations. eGFR's persistently <60 mL/min signify possible Chronic Kidney Disease.    Anion gap 6 5 - 15  Cooxemetry Panel (carboxy, met, total hgb, O2 sat)     Status: Abnormal   Collection Time: 03/18/16  4:24 AM  Result Value Ref Range   Total hemoglobin 10.5 (L) 12.0 - 16.0 g/dL   O2 Saturation 54.1 %   Carboxyhemoglobin 2.1 (H) 0.5 - 1.5 %   Methemoglobin 0.8 0.0 - 1.5 %  Culture, blood (Routine X 2) w Reflex to ID Panel     Status: None (Preliminary result)   Collection Time: 03/18/16 10:00 AM  Result Value Ref Range   Specimen Description BLOOD LEFT ANTECUBITAL  Special Requests IN PEDIATRIC BOTTLE  2CC    Culture  Setup Time      GRAM POSITIVE COCCI IN CLUSTERS IN PEDIATRIC BOTTLE    Culture GRAM POSITIVE COCCI    Report Status PENDING   Culture, blood (Routine X 2) w Reflex to ID Panel     Status: Abnormal (Preliminary result)   Collection Time: 03/18/16 10:06 AM  Result Value Ref Range   Specimen Description BLOOD LEFT HAND    Special Requests IN PEDIATRIC BOTTLE  3CC    Culture  Setup Time      GRAM POSITIVE COCCI IN CLUSTERS IN PEDIATRIC BOTTLE CRITICAL RESULT CALLED TO, READ BACK BY AND VERIFIED WITH: PHARMD A JOHNSTON [id number] 0904AM MLM    Culture STAPHYLOCOCCUS SPECIES (COAGULASE NEGATIVE) (A)    Report Status PENDING   Blood Culture ID Panel (Reflexed)     Status: Abnormal   Collection Time: 03/18/16 10:06 AM  Result Value Ref Range   Enterococcus species NOT DETECTED NOT DETECTED   Listeria monocytogenes NOT DETECTED NOT DETECTED   Staphylococcus species DETECTED (A) NOT DETECTED    Comment: CRITICAL RESULT CALLED TO, READ BACK BY AND VERIFIED WITH: PHARMD A JOHNSTON [id number] 0904AM MLM    Staphylococcus aureus NOT DETECTED NOT  DETECTED   Methicillin resistance NOT DETECTED NOT DETECTED   Streptococcus species NOT DETECTED NOT DETECTED   Streptococcus agalactiae NOT DETECTED NOT DETECTED   Streptococcus pneumoniae NOT DETECTED NOT DETECTED   Streptococcus pyogenes NOT DETECTED NOT DETECTED   Acinetobacter baumannii NOT DETECTED NOT DETECTED   Enterobacteriaceae species NOT DETECTED NOT DETECTED   Enterobacter cloacae complex NOT DETECTED NOT DETECTED   Escherichia coli NOT DETECTED NOT DETECTED   Klebsiella oxytoca NOT DETECTED NOT DETECTED   Klebsiella pneumoniae NOT DETECTED NOT DETECTED   Proteus species NOT DETECTED NOT DETECTED   Serratia marcescens NOT DETECTED NOT DETECTED   Haemophilus influenzae NOT DETECTED NOT DETECTED   Neisseria meningitidis NOT DETECTED NOT DETECTED   Pseudomonas aeruginosa NOT DETECTED NOT DETECTED   Candida albicans NOT DETECTED NOT DETECTED   Candida glabrata NOT DETECTED NOT DETECTED   Candida krusei NOT DETECTED NOT DETECTED   Candida parapsilosis NOT DETECTED NOT DETECTED   Candida tropicalis NOT DETECTED NOT DETECTED  Cooxemetry Panel (carboxy, met, total hgb, O2 sat)     Status: Abnormal   Collection Time: 03/19/16  5:00 AM  Result Value Ref Range   Total hemoglobin 10.4 (L) 12.0 - 16.0 g/dL   O2 Saturation 66.4 %   Carboxyhemoglobin 2.2 (H) 0.5 - 1.5 %   Methemoglobin 0.7 0.0 - 1.5 %  Basic metabolic panel     Status: Abnormal   Collection Time: 03/19/16  6:14 AM  Result Value Ref Range   Sodium 135 135 - 145 mmol/L   Potassium 3.9 3.5 - 5.1 mmol/L   Chloride 99 (L) 101 - 111 mmol/L   CO2 28 22 - 32 mmol/L   Glucose, Bld 89 65 - 99 mg/dL   BUN 20 6 - 20 mg/dL   Creatinine, Ser 1.04 0.61 - 1.24 mg/dL   Calcium 8.8 (L) 8.9 - 10.3 mg/dL   GFR calc non Af Amer >60 >60 mL/min   GFR calc Af Amer >60 >60 mL/min    Comment: (NOTE) The eGFR has been calculated using the CKD EPI equation. This calculation has not been validated in all clinical situations. eGFR's  persistently <60 mL/min signify possible Chronic Kidney Disease.    Anion gap 8  5 - 15  CBC     Status: Abnormal   Collection Time: 03/19/16  6:14 AM  Result Value Ref Range   WBC 5.1 4.0 - 10.5 K/uL   RBC 3.00 (L) 4.22 - 5.81 MIL/uL   Hemoglobin 9.4 (L) 13.0 - 17.0 g/dL   HCT 28.0 (L) 39.0 - 52.0 %   MCV 93.3 78.0 - 100.0 fL   MCH 31.3 26.0 - 34.0 pg   MCHC 33.6 30.0 - 36.0 g/dL   RDW 15.3 11.5 - 15.5 %   Platelets 266 150 - 400 K/uL  Antithrombin III     Status: None   Collection Time: 03/19/16  6:14 AM  Result Value Ref Range   AntiThromb III Func 98 75 - 120 %  HIV antibody     Status: None   Collection Time: 03/19/16  6:14 AM  Result Value Ref Range   HIV Screen 4th Generation wRfx Non Reactive Non Reactive    Comment: (NOTE) Performed At: [facility] [address] [place], [place] [id number] Lindon Romp MD EY:[id number]   Lactate dehydrogenase     Status: None   Collection Time: 03/19/16  6:14 AM  Result Value Ref Range   LDH 143 98 - 192 U/L  Prealbumin     Status: Abnormal   Collection Time: 03/19/16  6:14 AM  Result Value Ref Range   Prealbumin 17.2 (L) 18 - 38 mg/dL  PSA     Status: None   Collection Time: 03/19/16  6:14 AM  Result Value Ref Range   PSA 1.77 0.00 - 4.00 ng/mL    Comment: (NOTE) While PSA levels of <=4.0 ng/ml are reported as reference range, some men with levels below 4.0 ng/ml can have prostate cancer and many men with PSA above 4.0 ng/ml do not have prostate cancer.  Other tests such as free PSA, age specific reference ranges, PSA velocity and PSA doubling time may be helpful especially in men less than 58 years old.   TSH     Status: Abnormal   Collection Time: 03/19/16  6:14 AM  Result Value Ref Range   TSH 5.519 (H) 0.350 - 4.500 uIU/mL    Comment: Performed by a 3rd Generation assay with a functional sensitivity of <=0.01 uIU/mL.  Uric acid     Status: None   Collection Time: 03/19/16  6:14 AM  Result Value Ref  Range   Uric Acid, Serum 6.2 4.4 - 7.6 mg/dL  T4, free     Status: Abnormal   Collection Time: 03/19/16  6:14 AM  Result Value Ref Range   Free T4 1.22 (H) 0.61 - 1.12 ng/dL    Comment: (NOTE) Biotin ingestion may interfere with free T4 tests. If the results are inconsistent with the TSH level, previous test results, or the clinical presentation, then consider biotin interference. If needed, order repeat testing after stopping biotin.   ABO/Rh     Status: None   Collection Time: 03/19/16  6:20 AM  Result Value Ref Range   ABO/RH(D) A NEG   Type and screen Arjay     Status: None   Collection Time: 03/19/16  6:20 AM  Result Value Ref Range   ABO/RH(D) A NEG    Antibody Screen NEG    Sample Expiration 03/22/2016     Dg Lumbar Spine 2-3 Views  Result Date: 03/18/2016 CLINICAL DATA:  Acute low back pain without known injury. EXAM: LUMBAR SPINE - 2-3 VIEW COMPARISON:  CT scan of December 16, 2015. FINDINGS: No fracture or spondylolisthesis is noted. Atherosclerosis of abdominal aorta is noted. Mild degenerative disc disease is noted at L3-4. Remaining disc spaces appear intact. IMPRESSION: Mild degenerative disc disease at L3-4. No acute abnormality seen in the lumbar spine. Aortic atherosclerosis. Electronically Signed   By: Marijo Conception, M.D.   On: 03/18/2016 11:36   Dg Chest Port 1 View  Result Date: 03/17/2016 CLINICAL DATA:  PICC line placement EXAM: PORTABLE CHEST 1 VIEW COMPARISON:  03/17/2016 FINDINGS: The visualized bony structures of the thorax are intact. 2054 hours. Right PICC line tip overlies the distal SVC level. The cardio pericardial silhouette is enlarged. The lungs are clear wiithout focal pneumonia, edema, pneumothorax or pleural effusion. Left pacer/AICD again noted. IMPRESSION: 1. Right PICC line tip overlies the distal SVC. 2. Cardiomegaly. Electronically Signed   By: Misty Stanley M.D.   On: 03/17/2016 21:05    Review of Systems    Constitutional: Positive for malaise/fatigue. Negative for chills, diaphoresis, fever and weight loss.  HENT:       Has several remaining teeth anteriorly in mandible but remainder pulled in past. Has not seen dentist in years. Denies any pain in teeth or jaw.  Eyes: Negative.   Respiratory: Positive for shortness of breath. Negative for cough, hemoptysis and sputum production.   Cardiovascular: Positive for orthopnea and PND. Negative for chest pain, palpitations and leg swelling.  Gastrointestinal: Negative.   Genitourinary: Negative.   Musculoskeletal: Positive for back pain.  Skin: Positive for rash.       Few spots of eczema  Neurological: Positive for dizziness.       Mental fog recently  Endo/Heme/Allergies: Negative.   Psychiatric/Behavioral: Positive for depression.       PTSD from multiple ICD shocks.   Blood pressure (!) 89/46, pulse 67, temperature 97.8 F (36.6 C), temperature source Oral, resp. rate 13, height 5' 11"  (1.803 m), weight 95.8 kg (211 lb 2.7 oz), SpO2 96 %. Physical Exam  Constitutional: He is oriented to person, place, and time. He appears well-developed and well-nourished. No distress.  HENT:  Head: Normocephalic and atraumatic.  Mouth/Throat: Oropharynx is clear and moist.  Several mandibular teeth remain in fair condition  Eyes: Conjunctivae and EOM are normal. Pupils are equal, round, and reactive to light. No scleral icterus.  Neck: Normal range of motion. Neck supple. No JVD present. No thyromegaly present.  Cardiovascular: Normal rate, regular rhythm and intact distal pulses.   Murmur heard. 2/6 systolic murmur at apex. No diastolic murmur  Respiratory: Effort normal and breath sounds normal. No respiratory distress. He has no wheezes. He has no rales.  The ICD on the left anterior chest wall is starting to erode through the skin along the upper medial edge. There is no erythema or tenderness and the skin has not broken down yet, but close.  GI:  Soft. Bowel sounds are normal. He exhibits no distension and no mass. There is no tenderness.  Musculoskeletal: Normal range of motion. He exhibits no edema or tenderness.  Lymphadenopathy:    He has no cervical adenopathy.  Neurological: He is alert and oriented to person, place, and time. He has normal strength. No cranial nerve deficit or sensory deficit.  Skin: Skin is warm and dry.  Psychiatric: He has a normal mood and affect.                               *  [facility]*                         [address], [place] [address]                            [phone]  ------------------------------------------------------------------- Transthoracic Echocardiography  Patient:    [name], [name] MR #:       [id number] Study Date: 12/10/2015 Gender:     M Age:        23 Height:     180.3 cm Weight:     87.6 kg BSA:        2.11 m^2 Pt. Status: Room:       [id number]   ADMITTING    Minus Breeding, MD  Elenore Paddy, M.D.  REFERRING    Loralie Champagne, M.D.  ATTENDING    [place], [place], Inpatient  SONOGRAPHER  Darlina Sicilian, RDCS  cc:  ------------------------------------------------------------------- LV EF: 15%  ------------------------------------------------------------------- Indications:      Cardiomyopathy - ischemic 414.8.  ------------------------------------------------------------------- History:   PMH:  Second Degree AV Block.  Coronary artery disease. Congestive heart failure.  Transient ischemic attack.  ------------------------------------------------------------------- Study Conclusions  - Left ventricle: The cavity size was severely dilated. There was   moderate concentric hypertrophy. Systolic function was severely   reduced. The estimated ejection fraction was 15%. There is   akinesis of the mid-apicallateral and inferoseptal  myocardium.   There is akinesis of the apical myocardium. There is akinesis of   the mid-apicalanterior, inferolateral, and inferior myocardium.   The study is not technically sufficient to allow evaluation of LV   diastolic function. - Ventricular septum: Septal motion showed moderate paradox. These   changes are consistent with right ventricular pacing. - Aortic valve: There was mild regurgitation. - Aorta: Aortic root dimension: 38 mm (ED). - Aortic root: The aortic root was mildly dilated. - Mitral valve: There was mild to moderate regurgitation. - Left atrium: The atrium was moderately dilated. - Right ventricle: The cavity size was mildly dilated. Wall   thickness was normal. - Pulmonic valve: There was trivial regurgitation. - Pulmonary arteries: PA peak pressure: 53 mm Hg (S).  Impressions:  - The right ventricular systolic pressure was increased consistent   with moderate pulmonary hypertension.  ------------------------------------------------------------------- Labs, prior tests, procedures, and surgery: ICD.  ------------------------------------------------------------------- Study data:  Comparison was made to the study of 12/09/2014.  Study status:  Routine.  Procedure:  The patient reported no pain pre or post test. Transthoracic echocardiography. Image quality was poor. The study was technically difficult, as a result of poor acoustic windows. Intravenous contrast (Definity) was administered.  Study completion:  There were no complications.          Transthoracic echocardiography.  M-mode, complete 2D, spectral Doppler, and color Doppler.  Birthdate:  Patient birthdate: 12-Jul-1955.  Age:  Patient is 61 yr old.  Sex:  Gender: male.    BMI: 27 kg/m^2.  Blood pressure:     123/71  Patient status:  Inpatient.  Study date: Study date: 12/10/2015. Study time: 03:20 PM.  Location:  Bedside.    -------------------------------------------------------------------  ------------------------------------------------------------------- Left ventricle:  The cavity size was severely dilated. There was moderate concentric hypertrophy. Systolic function was severely reduced. The estimated ejection fraction was 15%.  Regional wall motion abnormalities:   There is akinesis of the mid-apicallateral and inferoseptal myocardium.  There is akinesis of the apical myocardium.  There is akinesis of the mid-apicalanterior, inferolateral, and inferior myocardium. The study is not technically sufficient to allow evaluation of LV diastolic function.  ------------------------------------------------------------------- Aortic valve:   Trileaflet; moderately thickened, moderately calcified leaflets. Mobility was not restricted.  Doppler: Transvalvular velocity was within the normal range. There was no stenosis. There was mild regurgitation.  ------------------------------------------------------------------- Aorta:  Aortic root: The aortic root was mildly dilated.  ------------------------------------------------------------------- Mitral valve:   Structurally normal valve.   Mobility was not restricted.  Doppler:  Transvalvular velocity was within the normal range. There was no evidence for stenosis. There was mild to moderate regurgitation.    Peak gradient (D): 7 mm Hg.  ------------------------------------------------------------------- Left atrium:  The atrium was moderately dilated.  ------------------------------------------------------------------- Right ventricle:  The cavity size was mildly dilated. Wall thickness was normal. Pacer wire or catheter noted in right ventricle. Systolic function was normal.  ------------------------------------------------------------------- Ventricular septum:   Septal motion showed moderate paradox. These changes are consistent with right  ventricular pacing.  ------------------------------------------------------------------- Pulmonic valve:    Structurally normal valve.   Cusp separation was normal.  Doppler:  Transvalvular velocity was within the normal range. There was no evidence for stenosis. There was trivial regurgitation.  ------------------------------------------------------------------- Tricuspid valve:   Structurally normal valve.    Doppler: Transvalvular velocity was within the normal range. There was mild regurgitation.  ------------------------------------------------------------------- Pulmonary artery:   The main pulmonary artery was normal-sized. Systolic pressure was within the normal range.  ------------------------------------------------------------------- Right atrium:  The atrium was normal in size. Pacer wire or catheter noted in right atrium.  ------------------------------------------------------------------- Pericardium:  There was no pericardial effusion.  ------------------------------------------------------------------- Systemic veins: Inferior vena cava: The vessel was dilated. The respirophasic diameter changes were blunted (< 50%), consistent with elevated central venous pressure.  ------------------------------------------------------------------- Measurements   Left ventricle                           Value        Reference  LV ID, ED, PLAX chordal          (H)     68.5  mm     43 - 52  LV ID, ES, PLAX chordal          (H)     60    mm     23 - 38  LV fx shortening, PLAX chordal   (L)     12    %      >=29  LV PW thickness, ED                      12.1  mm     ---------  IVS/LV PW ratio, ED                      1.12         <=1.3  Stroke volume, 2D                        64    ml     ---------  Stroke volume/bsa, 2D  30    ml/m^2 ---------  LV ejection fraction, 1-p A4C            12    %      ---------  LV end-diastolic volume, 2-p              327   ml     ---------  LV end-systolic volume, 2-p              277   ml     ---------  LV ejection fraction, 2-p                15    %      ---------  Stroke volume, 2-p                       50    ml     ---------  LV end-diastolic volume/bsa, 2-p         155   ml/m^2 ---------  LV end-systolic volume/bsa, 2-p          131   ml/m^2 ---------  Stroke volume/bsa, 2-p                   23.7  ml/m^2 ---------    Ventricular septum                       Value        Reference  IVS thickness, ED                        13.5  mm     ---------    LVOT                                     Value        Reference  LVOT ID, S                               24    mm     ---------  LVOT area                                4.52  cm^2   ---------  LVOT peak velocity, S                    81.9  cm/s   ---------  LVOT mean velocity, S                    50.1  cm/s   ---------  LVOT VTI, S                              14.1  cm     ---------    Aorta                                    Value        Reference  Aortic root ID, ED                       38  mm     ---------    Left atrium                              Value        Reference  LA ID, A-P, ES                           47    mm     ---------  LA ID/bsa, A-P                   (H)     2.23  cm/m^2 <=2.2  LA volume, S                             95.7  ml     ---------  LA volume/bsa, S                         45.4  ml/m^2 ---------  LA volume, ES, 1-p A4C                   91.6  ml     ---------  LA volume/bsa, ES, 1-p A4C               43.4  ml/m^2 ---------  LA volume, ES, 1-p A2C                   94.8  ml     ---------  LA volume/bsa, ES, 1-p A2C               45    ml/m^2 ---------    Mitral valve                             Value        Reference  Mitral E-wave peak velocity              134   cm/s   ---------  Mitral deceleration time         (L)     142   ms     150 - 230  Mitral peak gradient, D                  7     mm Hg  ---------     Pulmonary arteries                       Value        Reference  PA pressure, S, DP               (H)     53    mm Hg  <=30    Tricuspid valve                          Value        Reference  Tricuspid regurg peak velocity           308   cm/s   ---------  Tricuspid peak RV-RA gradient            38    mm Hg  ---------    Systemic veins  Value        Reference  Estimated CVP                            15    mm Hg  ---------    Right ventricle                          Value        Reference  RV pressure, S, DP               (H)     53    mm Hg  <=30  RV s&', lateral, S                        10.4  cm/s   ---------    Pulmonic valve                           Value        Reference  Pulmonic regurg velocity, ED             186   cm/s   ---------  Pulmonic regurg gradient, ED             14    mm Hg  ---------  Legend: (L)  and  (H)  mark values outside specified reference range.  ------------------------------------------------------------------- Prepared and Electronically Authenticated by  Fransico Him, MD 2017-10-17T18:16:07    [facility]  Cardiac catheterization  Order# [id number]  Reading physician: Larey Dresser, MD Ordering physician: Larey Dresser, MD Study date: 01/30/16  Physicians   Panel Physicians Referring Physician Case Authorizing Physician  Larey Dresser, MD (Primary)    Conclusion   Left ventriculography not done.  LV not entered due to multiple runs of VT this admission.   Right coronary artery: Dominant.  There was a stent in the mid RCA with 30-40% in-stent restenosis.  Otherwise luminarl irregularities.   Left main: Short, no significant disease.   Left circumflex system: Occluded small to moderate OM1, no change from prior cath. OM2 with 40 ostial stenosis.  Patent stent in proximal LCx.    Left anterior descending system: Patent long stented segment in the proximal LAD.  Small D1.  Small to moderate D2 with  ostial 90% stenosis.  D2 originates from the stented segment of proximal LAD so likely jailed.  No change from prior cath.   Impression:  No change compared to prior coronary angiography in 9/17.  No target for intervention.      CLINICAL DATA:  Right-sided chest pain  EXAM: CT ANGIOGRAPHY CHEST, ABDOMEN AND PELVIS  TECHNIQUE: Multidetector CT imaging through the chest, abdomen and pelvis was performed using the standard protocol during bolus administration of intravenous contrast. Multiplanar reconstructed images and MIPs were obtained and reviewed to evaluate the vascular anatomy.  CONTRAST:  100 mL Isovue 370.  COMPARISON:  Chest x-ray from earlier in the same day, 12/16/2015, 10/18/2015  FINDINGS: CTA CHEST FINDINGS  Cardiovascular: The thoracic aorta shows mild atherosclerotic calcifications without aneurysmal dilatation or dissection. Pulmonary artery shows a normal branching pattern without evidence of pulmonary embolism. Coronary calcifications and prior coronary stenting is noted. A defibrillator is again noted. Cardiac structures are mildly enlarged but stable. Multiple chest wall venous collaterals are identified likely related to stenosis of the left subclavian vein from  the defibrillator.  Mediastinum/Nodes: Thoracic inlet is within normal limits. Stable mediastinal adenopathy is noted in the right peritracheal and subcarinal region with the largest of these in the right peritracheal region measuring 16 mm in short axis which is stable from the prior exam. Scattered small hilar lymph nodes are noted also stable from the previous study. The esophagus as visualize is within normal limits.  Lungs/Pleura: Mild emphysematous changes are noted. No focal infiltrate or sizable effusion is seen. Mild peribronchial thickening is noted which is stable from the prior exam likely of a chronic nature. No acute abnormality is seen.  Musculoskeletal:  Degenerative changes of the thoracic spine are noted. No acute bony abnormality is seen.  Review of the MIP images confirms the above findings.  CTA ABDOMEN AND PELVIS FINDINGS  VASCULAR  Aorta: The abdominal aorta demonstrates atherosclerotic calcifications without aneurysmal dilatation or evidence of dissection.  Celiac: Mild atherosclerotic calcifications are noted at the origin although the celiac axis is widely patent.  SMA: Mild atherosclerotic changes are noted at the origin although the superior mesenteric artery is patent.  Renals: Single renal arteries are identified bilaterally with mild atherosclerotic calcifications. No focal hemodynamically significant stenosis is noted.  IMA: Widely patent  Iliacs: Mild atherosclerotic changes are seen without aneurysmal dilatation or dissection.  Veins: Although not timed for venous evaluation no definitive abnormality is seen.  Review of the MIP images confirms the above findings.  NON-VASCULAR  Hepatobiliary: Multiple hypodensities are again identified within the liver consistent with hepatic cysts. These are stable from previous exams. The gallbladder has been surgically removed.  Pancreas: Unremarkable. No pancreatic ductal dilatation or surrounding inflammatory changes.  Spleen: Normal in size without focal abnormality.  Adrenals/Urinary Tract: The adrenal glands are within normal limits. Kidneys are well visualized bilaterally without evidence of renal mass or calcification. No obstructive changes are seen. The bladder is well distended.  Stomach/Bowel: Scattered diverticular changes are noted throughout the colon. The appendix is well visualized and within normal limits. No obstructive changes are seen. No inflammatory changes are identified.  Lymphatic: No significant lymphadenopathy is identified.  Reproductive: Prostate is unremarkable.  Other: No abdominal wall hernia or  abnormality. No abdominopelvic ascites.  Musculoskeletal: No acute or significant osseous findings.  Review of the MIP images confirms the above findings.  IMPRESSION: CTA of the chest: No evidence of aortic dissection or aneurysmal dilatation. No definitive pulmonary emboli are identified.  Stable mediastinal and hilar lymph nodes likely reactive in nature. Mild changes of bronchitis are noted as well.  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  CTA of the abdomen and pelvis. No aneurysmal dilatation or dissection is noted.  Scattered chronic changes similar to that noted on previous exams.   Electronically Signed   By: Inez Catalina M.D.   On: 03/17/2016 08:33  Assessment/Plan:  1. Ischemic cardiomyopathy with an EF of 15% by prior echo, acute on chronic systolic heart failure with NYHA class IIIb-IV symptoms on presentation with borderline BP, elevated creatinine. He is symptomatically improved on Milrinone with Co-ox of 66.4 on 0.25 mcg. Plan to stop milrinone and do RHC tomorrow. His last echo in 11/2015 was reviewed and shows moderate RV dilation and mild dysfunction. He has some AI which was read as mild but looks like it may be significant to me and require closure of the aortic valve. He should probably have a TEE now to rule out endocarditis and this would reassess the AI now.  2. Severe multi-vessel CAD with multiple stents. I have reviewed his LHC  from 01/2016 and his coronary disease appears stable with no reason for intervention.   3. Mild enlargement of the ascending aorta which was measured by radiology at 4.0 x 4.1 cm in 09/2015 but there was motion artifact. His CTA now shows the ascending aorta to be 3.5 cm.  4. Positive blood cultures for Coag neg Staph x 2 of unclear etiology. ID has seen him. I agree that this is unlikely contamination with 2 positive cultures. Repeat cultures have been sent. I  am concerned about the ICD generator which looks like it may erode through the skin with drainage from the incision in 12/2015 after recent revision in 11/2015. He does not have any fever or leukocytosis but some ICD infections are indolent. I think he does need a TEE to rule out endocarditis. His back pain could be discitis but he has had this before. His CT of the abdomen and pelvis on admission does not show any significant abnormality of the lumbar or sacral spine and there is no phlegmon around the spine.   5.  Multiple missing teeth and no dental follow up. He will need dental evaluation by Dr. Enrique Sack to assess for dental infection. I don't think this is the cause of his Staph bacteremia but needs to be done before implantation of any device.  I think he would be a satisfactory LVAD candidate but the possibility of infection with positive blood cultures needs to be cleared up first. I think he may need to have the ICD system removed. The generator is going to erode through the skin before long. He seems to be a medically compliant patient who had been on coumadin in the past and ASA/Plavix now with no hx of bleeding problems. He has multiple family members that could help him out but exactly who will have to determined. I discussed LVAD therapy with him, benefits and risks in detail, the importance of complete medical compliance and close follow up. He would like to continue talking about it. We will see what his RHC looks like tomorrow and will discuss further workup and management of his bacteremia.  Gaye Pollack 03/19/2016, 8:07 PM

## 2016-03-19 NOTE — Evaluation (Signed)
Occupational Therapy Evaluation Patient Details Name: [name] MRN: [id number] DOB: [birth year] Today's Date: 03/19/2016    History of Present Illness Pt is a 61 y/o male admitted secondary to worsening SOB, acute CHF exacerbation. 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   Clinical Impression   Pt reports he was independent with BADL PTA. Currently pt overall min guard for ADL and functional mobility. Pt presenting with deconditioning, impaired standing balance, chronic lower back pain impacting his independence and safety with ADL and functional mobility. Pt planning to d/c home with intermittent family supervision. Pt would benefit from continued skilled OT to address established goals.    Follow Up Recommendations  No OT follow up;Supervision - Intermittent    Equipment Recommendations  None recommended by OT    Recommendations for Other Services       Precautions / Restrictions Restrictions Weight Bearing Restrictions: No      Mobility Bed Mobility               General bed mobility comments: Pt sitting EOB upon arrival.  Transfers Overall transfer level: Needs assistance Equipment used: None Transfers: Sit to/from Stand Sit to Stand: Min guard         General transfer comment: Increased time required. Min guard for safety.    Balance Overall balance assessment: Needs assistance Sitting-balance support: Feet supported;No upper extremity supported Sitting balance-Leahy Scale: Good     Standing balance support: No upper extremity supported;During functional activity Standing balance-Leahy Scale: Fair                              ADL Overall ADL's : Needs assistance/impaired Eating/Feeding: Modified independent;Sitting   Grooming: Min guard;Standing   Upper Body Bathing: Set up;Supervision/ safety;Sitting   Lower Body Bathing: Min guard;Sit  to/from stand   Upper Body Dressing : Set up;Supervision/safety;Sitting   Lower Body Dressing: Min guard;Sit to/from stand   Toilet Transfer: Min guard;Ambulation;Regular Toilet;RW       Tub/ Shower Transfer: Min guard;Tub transfer;Ambulation Tub/Shower Transfer Details (indicate cue type and reason): Simulated tub transfer Functional mobility during ADLs: Min guard;Rolling walker General ADL Comments: SpO2 down to high 70s on RA with mobility; back up in 90s with standing rest break, 99 with seated rest.     Vision     Perception     Praxis      Pertinent Vitals/Pain Pain Assessment: Faces Faces Pain Scale: Hurts even more Pain Location: lower back Pain Descriptors / Indicators: Sharp;Stabbing Pain Intervention(s): Monitored during session;Repositioned     Hand Dominance     Extremity/Trunk Assessment Upper Extremity Assessment Upper Extremity Assessment: Overall WFL for tasks assessed   Lower Extremity Assessment Lower Extremity Assessment: Defer to PT evaluation   Cervical / Trunk Assessment Cervical / Trunk Assessment: Other exceptions Cervical / Trunk Exceptions: chronic lower back pain   Communication Communication Communication: No difficulties   Cognition Arousal/Alertness: Awake/alert Behavior During Therapy: WFL for tasks assessed/performed Overall Cognitive Status: Within Functional Limits for tasks assessed                     General Comments       Exercises       Shoulder Instructions      Home Living Family/patient expects to be discharged to:: Private residence Living Arrangements: Other relatives Available Help at  Discharge: Family;Available PRN/intermittently Type of Home: House Home Access: Stairs to enter Entergy CorporationEntrance Stairs-Number of Steps: 8-10 Entrance Stairs-Rails: Right;Left Home Layout: One level     Bathroom Shower/Tub: Tub/shower unit Shower/tub characteristics: Curtain FirefighterBathroom Toilet: Standard     Home  Equipment: Environmental consultantWalker - 2 wheels;Cane - single point;Shower seat          Prior Functioning/Environment Level of Independence: Independent with assistive device(s)        Comments: pt reported that he ambulates with use of RW secondary to chronic back pain. does not drive        OT Problem List: Decreased activity tolerance;Impaired balance (sitting and/or standing);Decreased knowledge of use of DME or AE;Cardiopulmonary status limiting activity;Pain   OT Treatment/Interventions: Self-care/ADL training;Energy conservation;DME and/or AE instruction;Therapeutic activities;Patient/family education;Balance training    OT Goals(Current goals can be found in the care plan section) Acute Rehab OT Goals Patient Stated Goal: return home OT Goal Formulation: With patient Time For Goal Achievement: 04/02/16 Potential to Achieve Goals: Good ADL Goals Pt Will Perform Lower Body Bathing: with modified independence;sit to/from stand (with or without AE) Pt Will Perform Lower Body Dressing: with modified independence;sit to/from stand (with or without AE) Pt Will Transfer to Toilet: with modified independence;ambulating;regular height toilet Additional ADL Goal #1: Pt will independently verbally recall 3 energy conservation strategies.  OT Frequency: Min 2X/week   Barriers to D/C: Decreased caregiver support  sister works during the day       Co-evaluation              End of Session Equipment Utilized During Treatment: Engineer, waterolling walker Nurse Communication: Mobility status  Activity Tolerance: Patient tolerated treatment well Patient left: in chair;with call bell/phone within reach   Time: [id number] OT Time Calculation (min): 17 min Charges:  OT General Charges $OT Visit: 1 Procedure OT Evaluation $OT Eval Moderate Complexity: 1 Procedure G-Codes:     Gaye AlkenBailey A Gevorg Brum M.S., OTR/L Pager: [phone]: [phone]  03/19/2016, 4:22 PM

## 2016-03-20 ENCOUNTER — Encounter (HOSPITAL_COMMUNITY): Payer: Self-pay | Admitting: Cardiology

## 2016-03-20 ENCOUNTER — Encounter (HOSPITAL_COMMUNITY)
Admission: EM | Disposition: A | Discharge status: Home Health | Payer: Self-pay | Source: Home / Self Care | Attending: Cardiothoracic Surgery

## 2016-03-20 ENCOUNTER — Inpatient Hospital Stay (HOSPITAL_COMMUNITY): Payer: Medicaid Other

## 2016-03-20 DIAGNOSIS — R7881 Bacteremia: Secondary | ICD-10-CM

## 2016-03-20 DIAGNOSIS — I351 Nonrheumatic aortic (valve) insufficiency: Secondary | ICD-10-CM

## 2016-03-20 DIAGNOSIS — I509 Heart failure, unspecified: Secondary | ICD-10-CM

## 2016-03-20 HISTORY — PX: CARDIAC CATHETERIZATION: SHX172

## 2016-03-20 HISTORY — PX: TEE WITHOUT CARDIOVERSION: SHX5443

## 2016-03-20 LAB — HEPATITIS B CORE ANTIBODY, TOTAL: HEP B C TOTAL AB: NEGATIVE

## 2016-03-20 LAB — POCT I-STAT 3, VENOUS BLOOD GAS (G3P V)
ACID-BASE EXCESS: 4 mmol/L — AB (ref 0.0–2.0)
ACID-BASE EXCESS: 4 mmol/L — AB (ref 0.0–2.0)
Bicarbonate: 29.3 mmol/L — ABNORMAL HIGH (ref 20.0–28.0)
Bicarbonate: 30.2 mmol/L — ABNORMAL HIGH (ref 20.0–28.0)
O2 Saturation: 60 %
O2 Saturation: 63 %
PH VEN: 7.383 (ref 7.250–7.430)
PH VEN: 7.389 (ref 7.250–7.430)
TCO2: 32 mmol/L (ref 0–100)
TCO2: 31 mmol/L (ref 0–100)
pCO2, Ven: 48.5 mmHg (ref 44.0–60.0)
pCO2, Ven: 50.8 mmHg (ref 44.0–60.0)
pO2, Ven: 32 mmHg (ref 32.0–45.0)
pO2, Ven: 33 mmHg (ref 32.0–45.0)

## 2016-03-20 LAB — PROTIME-INR
INR: 1.05
PROTHROMBIN TIME: 13.7 s (ref 11.4–15.2)

## 2016-03-20 LAB — BASIC METABOLIC PANEL
Anion gap: 9 (ref 5–15)
BUN: 18 mg/dL (ref 6–20)
CO2: 29 mmol/L (ref 22–32)
CREATININE: 1.28 mg/dL — AB (ref 0.61–1.24)
Calcium: 9.2 mg/dL (ref 8.9–10.3)
Chloride: 97 mmol/L — ABNORMAL LOW (ref 101–111)
GFR, EST NON AFRICAN AMERICAN: 58 mL/min — AB (ref >60)
Glucose, Bld: 93 mg/dL (ref 65–99)
POTASSIUM: 4.4 mmol/L (ref 3.5–5.1)
SODIUM: 135 mmol/L (ref 135–145)

## 2016-03-20 LAB — HEPATITIS B SURFACE ANTIBODY,QUALITATIVE: Hep B S Ab: NONREACTIVE

## 2016-03-20 LAB — LUPUS ANTICOAGULANT PANEL
DRVVT: 43.7 s (ref 0.0–47.0)
PTT LA: 43.3 s (ref 0.0–51.9)

## 2016-03-20 LAB — HEPATITIS C ANTIBODY

## 2016-03-20 LAB — HEPATITIS B SURFACE ANTIGEN: Hepatitis B Surface Ag: NEGATIVE

## 2016-03-20 LAB — T3, FREE: T3, Free: 3.1 pg/mL (ref 2.0–4.4)

## 2016-03-20 LAB — COOXEMETRY PANEL
Carboxyhemoglobin: 1.2 % (ref 0.5–1.5)
METHEMOGLOBIN: 1 % (ref 0.0–1.5)
O2 Saturation: 53.2 %
TOTAL HEMOGLOBIN: 11.6 g/dL — AB (ref 12.0–16.0)

## 2016-03-20 SURGERY — ECHOCARDIOGRAM, TRANSESOPHAGEAL
Anesthesia: Moderate Sedation

## 2016-03-20 SURGERY — RIGHT HEART CATH

## 2016-03-20 MED ORDER — ACETAMINOPHEN 325 MG PO TABS: 650.0000 mg | ORAL_TABLET | ORAL | Status: DC | PRN

## 2016-03-20 MED ORDER — BISOPROLOL FUMARATE 5 MG PO TABS
2.5000 mg | ORAL_TABLET | Freq: Every day | ORAL | Status: DC
  Administered 2016-03-20 – 2016-03-25 (×6): 2.5 mg via ORAL
  Filled 2016-03-20: qty 0.5
  Filled 2016-03-20 (×3): qty 1
  Filled 2016-03-20: qty 0.5
  Filled 2016-03-20: qty 1

## 2016-03-20 MED ORDER — LIDOCAINE HCL (PF) 1 % IJ SOLN
INTRAMUSCULAR | Status: DC | PRN
  Administered 2016-03-20: 15 mL via INTRADERMAL

## 2016-03-20 MED ORDER — ONDANSETRON HCL 4 MG/2ML IJ SOLN: 4.0000 mg | Freq: Four times a day (QID) | INTRAMUSCULAR | Status: DC | PRN

## 2016-03-20 MED ORDER — MIDAZOLAM HCL 2 MG/2ML IJ SOLN
INTRAMUSCULAR | Status: DC | PRN
  Administered 2016-03-20 (×2): 1 mg via INTRAVENOUS

## 2016-03-20 MED ORDER — HEPARIN (PORCINE) IN NACL 2-0.9 UNIT/ML-% IJ SOLN
INTRAMUSCULAR | Status: AC
  Filled 2016-03-20: qty 500

## 2016-03-20 MED ORDER — OXYCODONE HCL 5 MG PO TABS
5.0000 mg | ORAL_TABLET | Freq: Four times a day (QID) | ORAL | Status: DC | PRN
  Administered 2016-03-20 – 2016-03-24 (×4): 5 mg via ORAL
  Filled 2016-03-20 (×4): qty 1

## 2016-03-20 MED ORDER — SODIUM CHLORIDE 0.9 % IV SOLN: 250.0000 mL | INTRAVENOUS | Status: DC | PRN

## 2016-03-20 MED ORDER — SODIUM CHLORIDE 0.9% FLUSH
3.0000 mL | Freq: Two times a day (BID) | INTRAVENOUS | Status: DC
  Administered 2016-03-20 (×2): 3 mL via INTRAVENOUS

## 2016-03-20 MED ORDER — FENTANYL CITRATE (PF) 100 MCG/2ML IJ SOLN
INTRAMUSCULAR | Status: AC
  Filled 2016-03-20: qty 2

## 2016-03-20 MED ORDER — SODIUM CHLORIDE 0.9% FLUSH: 3.0000 mL | INTRAVENOUS | Status: DC | PRN

## 2016-03-20 MED ORDER — MIDAZOLAM HCL 5 MG/ML IJ SOLN
INTRAMUSCULAR | Status: AC
  Filled 2016-03-20: qty 2

## 2016-03-20 MED ORDER — HEPARIN SODIUM (PORCINE) 5000 UNIT/ML IJ SOLN
5000.0000 [IU] | Freq: Three times a day (TID) | INTRAMUSCULAR | Status: DC
  Administered 2016-03-20 – 2016-03-26 (×17): 5000 [IU] via SUBCUTANEOUS
  Filled 2016-03-20 (×17): qty 1

## 2016-03-20 MED ORDER — MIDAZOLAM HCL 10 MG/2ML IJ SOLN
INTRAMUSCULAR | Status: DC | PRN
  Administered 2016-03-20 (×2): 2 mg via INTRAVENOUS

## 2016-03-20 MED ORDER — FENTANYL CITRATE (PF) 100 MCG/2ML IJ SOLN
INTRAMUSCULAR | Status: DC | PRN
  Administered 2016-03-20: 50 ug via INTRAVENOUS
  Administered 2016-03-20: 25 ug via INTRAVENOUS

## 2016-03-20 MED ORDER — LIDOCAINE HCL (PF) 1 % IJ SOLN
INTRAMUSCULAR | Status: AC
  Filled 2016-03-20: qty 30

## 2016-03-20 MED ORDER — DIPHENHYDRAMINE HCL 50 MG/ML IJ SOLN
INTRAMUSCULAR | Status: AC
  Filled 2016-03-20: qty 1

## 2016-03-20 MED ORDER — HEPARIN (PORCINE) IN NACL 2-0.9 UNIT/ML-% IJ SOLN
INTRAMUSCULAR | Status: DC | PRN
  Administered 2016-03-20: 500 mL

## 2016-03-20 MED ORDER — SPIRONOLACTONE 25 MG PO TABS
25.0000 mg | ORAL_TABLET | Freq: Every day | ORAL | Status: DC
  Administered 2016-03-20 – 2016-03-22 (×3): 25 mg via ORAL
  Filled 2016-03-20 (×3): qty 1

## 2016-03-20 MED ORDER — FENTANYL CITRATE (PF) 100 MCG/2ML IJ SOLN
INTRAMUSCULAR | Status: DC | PRN
  Administered 2016-03-20: 25 ug via INTRAVENOUS

## 2016-03-20 MED ORDER — BUTAMBEN-TETRACAINE-BENZOCAINE 2-2-14 % EX AERO
INHALATION_SPRAY | CUTANEOUS | Status: DC | PRN
  Administered 2016-03-20: 2 via TOPICAL

## 2016-03-20 MED ORDER — SODIUM CHLORIDE 0.9 % IV SOLN: INTRAVENOUS | Status: DC

## 2016-03-20 MED ORDER — MIDAZOLAM HCL 2 MG/2ML IJ SOLN
INTRAMUSCULAR | Status: AC
  Filled 2016-03-20: qty 2

## 2016-03-20 SURGICAL SUPPLY — 7 items
CATH SWAN GANZ 7F STRAIGHT (CATHETERS) ×2 IMPLANT
KIT HEART RIGHT NAMIC (KITS) ×2 IMPLANT
PACK CARDIAC CATHETERIZATION (CUSTOM PROCEDURE TRAY) ×3 IMPLANT
PROTECTION STATION PRESSURIZED (MISCELLANEOUS) ×3
SHEATH PINNACLE 7F 10CM (SHEATH) ×2 IMPLANT
STATION PROTECTION PRESSURIZED (MISCELLANEOUS) IMPLANT
TRANSDUCER W/STOPCOCK (MISCELLANEOUS) ×3 IMPLANT

## 2016-03-20 NOTE — H&P (View-Only) (Signed)
Advanced Heart Failure Rounding Note  Primary Cardiologist: Dr Jens Somrenshaw Primary HF: [name]   Subjective:    Admitted from ED 03/17/16 with recurrent low output HF with NYHA IIIb-IV symptoms.   Initial Coox 30%, however by time stamps this was PRIOR to PICC placement. So likely inaccurate/peripheral draw.   Coox 66.4% this am on 0.25 mcg/kg/min.    Feeling good this am. Walked halls without dyspnea yesterday. Hasn't walked yet today.   Out 1.1 L though weight stable back on lasix 40 mg BID po. . Creatinine stable. BUN with gentle uptrend.   Objective:   Weight Range: 211 lb 2.7 oz (95.8 kg) Body mass index is 29.45 kg/m.   Vital Signs:   Temp:  [97.4 F (36.3 C)-98.3 F (36.8 C)] 97.4 F (36.3 C) (01/25 0300) Pulse Rate:  [59-75] 69 (01/25 0300) Resp:  [13-22] 14 (01/25 0300) BP: (92-132)/(53-73) 105/58 (01/25 0300) SpO2:  [99 %-100 %] 100 % (01/24 2150) Weight:  [211 lb 2.7 oz (95.8 kg)] 211 lb 2.7 oz (95.8 kg) (01/25 0300) Last BM Date: 03/16/16  Weight change: Filed Weights   03/17/16 1708 03/18/16 0423 03/19/16 0300  Weight: 211 lb 6.4 oz (95.9 kg) 211 lb 11.2 oz (96 kg) 211 lb 2.7 oz (95.8 kg)    Intake/Output:   Intake/Output Summary (Last 24 hours) at 03/19/16 0721 Last data filed at 03/19/16 0415  Gross per 24 hour  Intake              312 ml  Output             1500 ml  Net            -1188 ml     Physical Exam: General: Well appearing. NAD.    HEENT: Normal.  Neck: supple. JVD 6-7 cm cm. Carotids 2+ bilat; no bruits. No thyromegaly or nodule noted.  Cor: PMI nondisplaced. Regular. No M/G/R Lungs: CTAB, normal effort Abdomen: soft, NT, ND, no HSM. No bruits or masses. +BS  Extremities: no cyanosis, clubbing, rash. Trace ankle edema. Slightly cool.  Neuro: alert & oriented x 3, cranial nerves grossly intact. moves all 4 extremities w/o difficulty. Affect pleasant.  Telemetry: Reviewed, A sensed V paced.   Labs: CBC  Recent Labs  03/17/16 0311 03/19/16 0614  WBC 10.8* 5.1  NEUTROABS 8.3*  --   HGB 11.2* 9.4*  HCT 34.1* 28.0*  MCV 94.2 93.3  PLT 260 266   Basic Metabolic Panel  Recent Labs  03/17/16 0311 03/18/16 0411  NA 135 135  K 4.4 4.2  CL 101 102  CO2 26 27  GLUCOSE 96 98  BUN 27* 28*  CREATININE 1.43* 1.35*  CALCIUM 9.2 8.6*   Liver Function Tests  Recent Labs  03/17/16 0311  AST 17  ALT 17  ALKPHOS 47  BILITOT 0.8  PROT 6.9  ALBUMIN 3.6    Recent Labs  03/17/16 0311  LIPASE 14   Cardiac Enzymes No results for input(s): CKTOTAL, CKMB, CKMBINDEX, TROPONINI in the last 72 hours.  BNP: BNP (last 3 results)  Recent Labs  01/13/16 1008 01/27/16 1155 03/17/16 0311  BNP 256.0* 595.7* 894.5*    ProBNP (last 3 results) No results for input(s): PROBNP in the last 8760 hours.   D-Dimer No results for input(s): DDIMER in the last 72 hours. Hemoglobin A1C No results for input(s): HGBA1C in the last 72 hours. Fasting Lipid Panel No results for input(s): CHOL, HDL, LDLCALC, TRIG, CHOLHDL,  LDLDIRECT in the last 72 hours. Thyroid Function Tests No results for input(s): TSH, T4TOTAL, T3FREE, THYROIDAB in the last 72 hours.  Invalid input(s): FREET3  Other results:     Imaging/Studies:  Dg Lumbar Spine 2-3 Views  Result Date: 03/18/2016 CLINICAL DATA:  Acute low back pain without known injury. EXAM: LUMBAR SPINE - 2-3 VIEW COMPARISON:  CT scan of December 16, 2015. FINDINGS: No fracture or spondylolisthesis is noted. Atherosclerosis of abdominal aorta is noted. Mild degenerative disc disease is noted at L3-4. Remaining disc spaces appear intact. IMPRESSION: Mild degenerative disc disease at L3-4. No acute abnormality seen in the lumbar spine. Aortic atherosclerosis. Electronically Signed   By: Lupita Raider, M.D.   On: 03/18/2016 11:36      Medications:     Scheduled Medications: . amiodarone  200 mg Oral Daily  . aspirin EC  81 mg Oral Daily  . atorvastatin  40  mg Oral q1800  . clopidogrel  75 mg Oral Daily  . digoxin  0.125 mg Oral QODAY  . enoxaparin (LOVENOX) injection  40 mg Subcutaneous Q24H  . famotidine  20 mg Oral BID  . furosemide  40 mg Oral BID  . losartan  25 mg Oral Daily  . magnesium oxide  400 mg Oral Daily  . mexiletine  200 mg Oral Q12H  . nicotine  21 mg Transdermal Daily  . PARoxetine  10 mg Oral Daily  . sodium chloride flush  3 mL Intravenous Q12H  . spironolactone  12.5 mg Oral QHS    Infusions: . milrinone 0.25 mcg/kg/min (03/18/16 0444)    PRN Medications: sodium chloride, acetaminophen, albuterol, ALPRAZolam, cyclobenzaprine, hydrOXYzine, nitroGLYCERIN, ondansetron (ZOFRAN) IV, oxyCODONE-acetaminophen, sodium chloride flush, sodium chloride flush, traMADol   Assessment/Plan   1. Acute on chronic systolic CHF - Echo 11/2015 LVEF 15% 2/2 ICM - Volume status stable on exam. CVP remains 6-7.  - Continue po lasix 40 mg BID.  - Coox 66.4% this am on milrinone 0.25 mcg/kg/min. Will wean to 0.125 mcg/mg/min now, and stop this evening for RHC OFF milrinone in am.  - No BB with low output.   - Continue spiro 25 mg daily - Continue losartan 25 mg daily.  No room this am to uptitrate or switch to [employer]. - Continue digoxin 0.125 mg daily. Level 0.2 on admit.  - On going work up for LVAD.  Surgeon to see this am. VAD coordinator has seen. HFSW to plan family meeting today.   2. CAD - Recent LHC in 12/17 with stable coronary disease - Chest pressure improved. Likely demand ischemia with his low output.  - No change to current plan.  3. Ascending aortic aneurysm - 4.1 cm 8/17. No evidence of dissection on CTA 03/17/16.  4. VT storm in 12/17 - Continue amiodarone and Mexiletine - No VT on tele. -  EP has seen 03/18/16. No indication to re-activate LV lead at this time. CRT would be unlikely to provide enough benefit to avoid advanced therapies and there is a strong chance it could trigger VT again.   5. Low back pain:    - Lumbar 2-3 view with mild degenerative disc disease at L3-L4.  - Suspect pain may be purely muscular.  - Continue flexeril and tramadol prn.  - Blood Cx to r/o discitis pending. 6. VAD work up - VAD coordinator has seen.  No high risk factors identified at this time. HFSW to see today and schedule family meeting.  Have also asked Surgeon  to see this admission.   Wean milrinone off today.  Plan on RHC OFF inotrope support first thing tomorrow am. Orders placed.   Length of Stay: 2  Luane School  03/19/2016, 7:21 AM  Advanced Heart Failure Team Pager [phone] (M-F; 7a - 4p)  Please contact [facility] for night-coverage after hours (4p -7a ) and weekends on [email or web address]  Patient seen with PA, agree with the above note.    In addition, blood culture returned today with gram positive cocci in clusters.  Done because of low back pain and presence of device to screen for bacteremia from discitis.  Low back plain films unremarkable.  Will await speciation, given lack of fever and normal WBCs may be contaminant.  May need to involve ID and further imaging based on result of culture.  Will start vancomycin per pharmacy consult.   Will plan weaning milrinone as above with RHC tomorrow.  Will avoid leaving [place] in given bacteremia so will go from right femoral.   Ongoing LVAD workup.    Marca Ancona 03/19/2016 9:08 AM

## 2016-03-20 NOTE — Progress Notes (Addendum)
Site area: RFV Site Prior to Removal:  Level 0 Pressure Applied For:10 min Manual:  yes  Patient Status During Pull:  stable Post Pull Site:  Level 0 Post Pull Instructions Given: yes  Post Pull Pulses Present: palpable Dressing Applied:  tegaderm Bedrest begins @ 0850 till1050 Comments:

## 2016-03-20 NOTE — Progress Notes (Addendum)
Physical Therapy Treatment Patient Details Name: [name] MRN: [id number] DOB: May 24, 1955 Today's Date: 03/20/2016    History of Present Illness Pt is a 61 y/o male admitted secondary to worsening SOB, acute CHF exacerbation. 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 s/p cardioversion 03/20/16.     PT Comments    Pt progressing with mobility. He ambulated 130' with RW, SaO2 82-92% on RA with walking, 0/4 dyspnea.    Follow Up Recommendations  No PT follow up     Equipment Recommendations  None recommended by PT    Recommendations for Other Services       Precautions / Restrictions Precautions Precautions: Other (comment) Precaution Comments: monitor O2 Restrictions Weight Bearing Restrictions: No    Mobility  Bed Mobility               General bed mobility comments: Pt sitting EOB upon arrival.  Transfers Overall transfer level: Needs assistance Equipment used: None Transfers: Sit to/from Stand Sit to Stand: Supervision         General transfer comment: supervision for safety  Ambulation/Gait Ambulation/Gait assistance: Supervision Ambulation Distance (Feet): 130 Feet Assistive device: Rolling walker (2 wheeled) Gait Pattern/deviations: Step-through pattern;Trunk flexed Gait velocity: decreased   General Gait Details: steady with RW, no LOB, VCs for posture and positioning in RW, SaO2 82-92% on RA walking, no dyspnea, VCs for pursed lip breathing   Stairs            Wheelchair Mobility    Modified Rankin (Stroke Patients Only)       Balance Overall balance assessment: Needs assistance Sitting-balance support: Feet supported;No upper extremity supported Sitting balance-Leahy Scale: Good     Standing balance support: No upper extremity supported;During functional activity Standing balance-Leahy Scale: Fair                       Cognition Arousal/Alertness: Awake/alert Behavior During Therapy: WFL for tasks assessed/performed Overall Cognitive Status: Within Functional Limits for tasks assessed                      Exercises      General Comments        Pertinent Vitals/Pain Pain Assessment: 0-10 Pain Score: 7  Pain Location: lower back Pain Descriptors / Indicators: Sore Pain Intervention(s): Limited activity within patient's tolerance;Monitored during session;Premedicated before session    Home Living                      Prior Function            PT Goals (current goals can now be found in the care plan section) Acute Rehab PT Goals Patient Stated Goal: to ride his [address] [place], to walk without a walker PT Goal Formulation: With patient Time For Goal Achievement: 04/01/16 Potential to Achieve Goals: Good Progress towards PT goals: Progressing toward goals    Frequency    Min 3X/week      PT Plan Current plan remains appropriate    Co-evaluation             End of Session Equipment Utilized During Treatment: Gait belt Activity Tolerance: Patient tolerated treatment well Patient left: with call bell/phone within reach;in chair     Time: 4098-1191 PT Time Calculation (min) (ACUTE ONLY): 24 min  Charges:  $Gait Training: 8-22 mins $Therapeutic Activity: 8-22 mins  G Codes:      Ralene BatheUhlenberg, Rogerick Baldwin Kistler 03/20/2016, 3:05 PM [phone]

## 2016-03-20 NOTE — Interval H&P Note (Signed)
History and Physical Interval Note:  03/20/2016 11:35 AM  [name]  has presented today for surgery, with the diagnosis of endocarditis  The various methods of treatment have been discussed with the patient and family. After consideration of risks, benefits and other options for treatment, the patient has consented to  Procedure(s): TRANSESOPHAGEAL ECHOCARDIOGRAM (TEE) (N/A) as a surgical intervention .  The patient's history has been reviewed, patient examined, no change in status, stable for surgery.  I have reviewed the patient's chart and labs.  Questions were answered to the patient's satisfaction.     Kristeen MissPhilip Nahser

## 2016-03-20 NOTE — Progress Notes (Signed)
Patient ID: [name], male   DOB: 08/31/1955, 61 y.o.   MRN: [id number]     Advanced Heart Failure Rounding Note  Primary Cardiologist: Dr Jens Som Primary HF: Dr. Shirlee Latch   Subjective:    Admitted from ED 03/17/16 with recurrent low output HF with NYHA IIIb-IV symptoms.   Initial Coox 30%, however by time stamps this was PRIOR to PICC placement. So likely inaccurate/peripheral draw.   He was on milrinone initially with diuresis, now off.  RHC was done today (see below).   Main complaint continues to be low back pain but able to walk.  CT abdomen/pelvis did not show evidence for discitis or acute MSK changes.   He has coag negative Staph in blood.  There has been concern about his ICD pocket.  No fever and WBCs normal.   RHC Procedural Findings (1/26, off milrinone): Hemodynamics (mmHg) RA mean 1 RV 36/8 PA 36/11, mean 21 PCWP mean 7 Oxygen saturations: PA 62% AO 96% Cardiac Output (Fick) 6.58  Cardiac Index (Fick) 3.06 Cardiac Output (Thermo) 6.62 Cardiac Index (Thermo) 3.08  Objective:   Weight Range: 208 lb 9.6 oz (94.6 kg) Body mass index is 29.09 kg/m.   Vital Signs:   Temp:  [97.5 F (36.4 C)-97.9 F (36.6 C)] 97.7 F (36.5 C) (01/26 0441) Pulse Rate:  [65-86] 73 (01/26 0812) Resp:  [12-26] 18 (01/26 0812) BP: (89-134)/(46-79) 105/65 (01/26 0812) SpO2:  [94 %-100 %] 99 % (01/26 0812) Weight:  [208 lb 9.6 oz (94.6 kg)] 208 lb 9.6 oz (94.6 kg) (01/26 0441) Last BM Date: 03/16/16  Weight change: Filed Weights   03/18/16 0423 03/19/16 0300 03/20/16 0441  Weight: 211 lb 11.2 oz (96 kg) 211 lb 2.7 oz (95.8 kg) 208 lb 9.6 oz (94.6 kg)    Intake/Output:   Intake/Output Summary (Last 24 hours) at 03/20/16 0831 Last data filed at 03/20/16 0645  Gross per 24 hour  Intake           1862.3 ml  Output             3275 ml  Net          -1412.7 ml     Physical Exam: General: Well appearing. NAD.    HEENT: Normal.  Neck: supple. JVD 6-7 cm cm. Carotids 2+  bilat; no bruits. No thyromegaly or nodule noted.  Cor: PMI nondisplaced. Regular. No M/G/R Lungs: CTAB, normal effort Abdomen: soft, NT, ND, no HSM. No bruits or masses. +BS  Extremities: no cyanosis, clubbing, rash. Trace ankle edema. Slightly cool.  Neuro: alert & oriented x 3, cranial nerves grossly intact. moves all 4 extremities w/o difficulty. Affect pleasant.  Telemetry: Reviewed, A sensed V paced.   Labs: CBC  Recent Labs  03/19/16 0614  WBC 5.1  HGB 9.4*  HCT 28.0*  MCV 93.3  PLT 266   Basic Metabolic Panel  Recent Labs  03/19/16 0614 03/20/16 0503  NA 135 135  K 3.9 4.4  CL 99* 97*  CO2 28 29  GLUCOSE 89 93  BUN 20 18  CREATININE 1.04 1.28*  CALCIUM 8.8* 9.2   Liver Function Tests No results for input(s): AST, ALT, ALKPHOS, BILITOT, PROT, ALBUMIN in the last 72 hours. No results for input(s): LIPASE, AMYLASE in the last 72 hours. Cardiac Enzymes No results for input(s): CKTOTAL, CKMB, CKMBINDEX, TROPONINI in the last 72 hours.  BNP: BNP (last 3 results)  Recent Labs  01/13/16 1008 01/27/16 1155 03/17/16 0311  BNP 256.0*  595.7* 894.5*    ProBNP (last 3 results) No results for input(s): PROBNP in the last 8760 hours.   D-Dimer No results for input(s): DDIMER in the last 72 hours. Hemoglobin A1C No results for input(s): HGBA1C in the last 72 hours. Fasting Lipid Panel No results for input(s): CHOL, HDL, LDLCALC, TRIG, CHOLHDL, LDLDIRECT in the last 72 hours. Thyroid Function Tests  Recent Labs  03/19/16 0614  TSH 5.519*  T3FREE 3.1    Other results:     Imaging/Studies:  No results found.    Medications:     Scheduled Medications: . [MAR Hold] amiodarone  200 mg Oral Daily  . [MAR Hold] aspirin EC  81 mg Oral Daily  . [MAR Hold] atorvastatin  40 mg Oral q1800  . bisoprolol  2.5 mg Oral Daily  . [MAR Hold] cefTRIAXone (ROCEPHIN)  IV  2 g Intravenous Q24H  . [MAR Hold] clopidogrel  75 mg Oral Daily  . [MAR Hold]  digoxin  0.125 mg Oral QODAY  . [MAR Hold] enoxaparin (LOVENOX) injection  40 mg Subcutaneous Q24H  . [MAR Hold] famotidine  20 mg Oral BID  . [MAR Hold] furosemide  40 mg Oral BID  . [MAR Hold] losartan  25 mg Oral Daily  . [MAR Hold] magnesium oxide  400 mg Oral Daily  . [MAR Hold] mexiletine  200 mg Oral Q12H  . [MAR Hold] nicotine  21 mg Transdermal Daily  . [MAR Hold] PARoxetine  10 mg Oral Daily  . [MAR Hold] sodium chloride flush  3 mL Intravenous Q12H  . sodium chloride flush  3 mL Intravenous Q12H  . [MAR Hold] sodium chloride flush  3 mL Intravenous Q12H  . [MAR Hold] spironolactone  12.5 mg Oral QHS    Infusions: . sodium chloride 10 mL/hr at 03/19/16 2341  . sodium chloride      PRN Medications: [MAR Hold] sodium chloride, sodium chloride, [MAR Hold] acetaminophen, [MAR Hold] albuterol, [MAR Hold] ALPRAZolam, [MAR Hold] cyclobenzaprine, [MAR Hold] hydrOXYzine, [MAR Hold] nitroGLYCERIN, [MAR Hold] ondansetron (ZOFRAN) IV, [MAR Hold] oxyCODONE-acetaminophen, [MAR Hold] sodium chloride flush, [MAR Hold] sodium chloride flush, sodium chloride flush, [MAR Hold] sodium chloride flush, [MAR Hold] traMADol   Assessment/Plan   1. Acute on chronic systolic CHF: Echo 11/2015 LVEF 15%, ischemic cardiomyopathy.  Initially on milrinone and diuresed, now off milrinone and back on po Lasix.  See RHC above => filling pressures optimized and CO preserved off milrinone.  I wonder if his initial malaise/fatigue may have been due to indolent infection with Coag negative Staph? Long-term, I am still concerned about him.  We have begun LVAD workup.  - He can stay off milrinone at this point.   - Continue po lasix 40 mg BID.  - Can go back on home dose bisoprolol 2.5 mg daily.    - Continue spiro 25 mg daily - Continue losartan 25 mg daily.  No room this am to uptitrate or switch to [employer]. - Continue digoxin 0.125 mg daily. Level 0.2 on admit.  - On going work up for LVAD, I think this may be  in the cards for him eventually.  He may end up being a transplant candidate down the road, just stopped smoking. VAD coordinator and Dr. Laneta SimmersBartle have seen. HFSW to plan family meeting today.   2. CAD: Recent LHC in 12/17 with stable coronary disease  - Continue statin, ASA, Plavix.   3. Ascending aortic aneurysm - 4.1 cm 8/17. No evidence of dissection on CTA  03/17/16.  4. VT storm in 12/17: Continue amiodarone and Mexiletine. EP has seen 03/18/16. No plan to re-activate LV lead at this time. There is a strong chance it could trigger VT again. - No VT on tele.    5. Low back pain:  Suspect pain may be purely muscular.  No discitis/abscess on CT abdomen/pelvis.  - Continue flexeril and tramadol prn.  6. 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  LV lead is off due to possible pro-arrhythmia.  He does have some RV pacing.  As above, discussed with EP and plan to keep LV lead off.  7. ID: Blood cultures (done for back pain, ?discitis given concerns about ICD pocket) returned 2/2 coag negative Staph.  There are ongoing concerns regarding ICD pocket.  ID has seen.  - Continue ceftriaxone IV per ID.  Blood cultures re-drawn yesterday.  - Will arrange for TEE today to assess the ICD leads and valve.  Will make decision on length of abx post- TEE.    Marca Ancona 03/20/2016 8:31 AM

## 2016-03-20 NOTE — Interval H&P Note (Signed)
History and Physical Interval Note:  03/20/2016 7:52 AM  [name]  has presented today for surgery, with the diagnosis of hf  The various methods of treatment have been discussed with the patient and family. After consideration of risks, benefits and other options for treatment, the patient has consented to  Procedure(s): Right Heart Cath (N/A) as a surgical intervention .  The patient's history has been reviewed, patient examined, no change in status, stable for surgery.  I have reviewed the patient's chart and labs.  Questions were answered to the patient's satisfaction.     Izic Stfort Chesapeake EnergyMcLean

## 2016-03-20 NOTE — Progress Notes (Signed)
[facility]   Reason for visit: Follow up on bacteremia  Interval History: repeat blood cultures sent; TEE without vegetation, afebrile, normal wbc  Physical Exam: Constitutional:  Vitals:   03/20/16 1500 03/20/16 1536  BP: (!) 90/58   Pulse: (!) 58 63  Resp: 20 16  Temp: 97.4 F (36.3 C)    patient appears in NAD Respiratory: Normal respiratory effort; CTA B Cardiovascular: RRR; pocket with erosion  Review of Systems: Constitutional: negative for fevers and chills Gastrointestinal: negative for diarrhea  Lab Results  Component Value Date   WBC 5.1 03/19/2016   HGB 9.4 (L) 03/19/2016   HCT 28.0 (L) 03/19/2016   MCV 93.3 03/19/2016   PLT 266 03/19/2016    Lab Results  Component Value Date   CREATININE 1.28 (H) 03/20/2016   BUN 18 03/20/2016   NA 135 03/20/2016   K 4.4 03/20/2016   CL 97 (L) 03/20/2016   CO2 29 03/20/2016    Lab Results  Component Value Date   ALT 17 03/17/2016   AST 17 03/17/2016   ALKPHOS 47 03/17/2016     Microbiology: Recent Results (from the past 240 hour(s))  MRSA PCR Screening     Status: None   Collection Time: 03/17/16  5:15 PM  Result Value Ref Range Status   MRSA by PCR NEGATIVE NEGATIVE Final    Comment:        The GeneXpert MRSA Assay (FDA approved for NASAL specimens only), is one component of a comprehensive MRSA colonization surveillance program. It is not intended to diagnose MRSA infection nor to guide or monitor treatment for MRSA infections.   Culture, blood (Routine X 2) w Reflex to ID Panel     Status: Abnormal (Preliminary result)   Collection Time: 03/18/16 10:00 AM  Result Value Ref Range Status   Specimen Description BLOOD LEFT ANTECUBITAL  Final   Special Requests IN PEDIATRIC BOTTLE  2CC  Final   Culture  Setup Time   Final    GRAM POSITIVE COCCI IN CLUSTERS IN PEDIATRIC BOTTLE    Culture STAPHYLOCOCCUS EPIDERMIDIS (A)  Final   Report Status PENDING  Incomplete    Culture, blood (Routine X 2) w Reflex to ID Panel     Status: Abnormal (Preliminary result)   Collection Time: 03/18/16 10:06 AM  Result Value Ref Range Status   Specimen Description BLOOD LEFT HAND  Final   Special Requests IN PEDIATRIC BOTTLE  3CC  Final   Culture  Setup Time   Final    GRAM POSITIVE COCCI IN CLUSTERS IN PEDIATRIC BOTTLE CRITICAL RESULT CALLED TO, READ BACK BY AND VERIFIED WITH: PHARMD A JOHNSTON [id number] MLM    Culture (A)  Final    STAPHYLOCOCCUS EPIDERMIDIS SUSCEPTIBILITIES TO FOLLOW    Report Status PENDING  Incomplete  Blood Culture ID Panel (Reflexed)     Status: Abnormal   Collection Time: 03/18/16 10:06 AM  Result Value Ref Range Status   Enterococcus species NOT DETECTED NOT DETECTED Final   Listeria monocytogenes NOT DETECTED NOT DETECTED Final   Staphylococcus species DETECTED (A) NOT DETECTED Final    Comment: CRITICAL RESULT CALLED TO, READ BACK BY AND VERIFIED WITH: PHARMD A JOHNSTON [id number] 0904AM MLM    Staphylococcus aureus NOT DETECTED NOT DETECTED Final   Methicillin resistance NOT DETECTED NOT DETECTED Final   Streptococcus species NOT DETECTED NOT DETECTED Final   Streptococcus agalactiae NOT DETECTED NOT DETECTED Final   Streptococcus pneumoniae NOT  DETECTED NOT DETECTED Final   Streptococcus pyogenes NOT DETECTED NOT DETECTED Final   Acinetobacter baumannii NOT DETECTED NOT DETECTED Final   Enterobacteriaceae species NOT DETECTED NOT DETECTED Final   Enterobacter cloacae complex NOT DETECTED NOT DETECTED Final   Escherichia coli NOT DETECTED NOT DETECTED Final   Klebsiella oxytoca NOT DETECTED NOT DETECTED Final   Klebsiella pneumoniae NOT DETECTED NOT DETECTED Final   Proteus species NOT DETECTED NOT DETECTED Final   Serratia marcescens NOT DETECTED NOT DETECTED Final   Haemophilus influenzae NOT DETECTED NOT DETECTED Final   Neisseria meningitidis NOT DETECTED NOT DETECTED Final   Pseudomonas aeruginosa NOT DETECTED NOT  DETECTED Final   Candida albicans NOT DETECTED NOT DETECTED Final   Candida glabrata NOT DETECTED NOT DETECTED Final   Candida krusei NOT DETECTED NOT DETECTED Final   Candida parapsilosis NOT DETECTED NOT DETECTED Final   Candida tropicalis NOT DETECTED NOT DETECTED Final    Impression/Plan:  1. bacteremia - Staph epidermidis in 2/2.  Repeat blood cultures sent.  TEE without vegetation on leads.  I still suspect ICD most.  Will continue with ceftriaxone.  OK to keep picc in from ID standpoint with S epidermidis. CT chest did not show any lumbar infection.   Dr. Graciela HusbandsKlein to see  2. Back pain - as above, I much less suspect discitis.   Dr. Drue SecondSnider will follow up over the weekend

## 2016-03-20 NOTE — H&P (View-Only) (Signed)
Patient ID: [name], male   DOB: 08/31/1955, 61 y.o.   MRN: [id number]     Advanced Heart Failure Rounding Note  Primary Cardiologist: Dr Jens Som Primary HF: Dr. Shirlee Latch   Subjective:    Admitted from ED 03/17/16 with recurrent low output HF with NYHA IIIb-IV symptoms.   Initial Coox 30%, however by time stamps this was PRIOR to PICC placement. So likely inaccurate/peripheral draw.   He was on milrinone initially with diuresis, now off.  RHC was done today (see below).   Main complaint continues to be low back pain but able to walk.  CT abdomen/pelvis did not show evidence for discitis or acute MSK changes.   He has coag negative Staph in blood.  There has been concern about his ICD pocket.  No fever and WBCs normal.   RHC Procedural Findings (1/26, off milrinone): Hemodynamics (mmHg) RA mean 1 RV 36/8 PA 36/11, mean 21 PCWP mean 7 Oxygen saturations: PA 62% AO 96% Cardiac Output (Fick) 6.58  Cardiac Index (Fick) 3.06 Cardiac Output (Thermo) 6.62 Cardiac Index (Thermo) 3.08  Objective:   Weight Range: 208 lb 9.6 oz (94.6 kg) Body mass index is 29.09 kg/m.   Vital Signs:   Temp:  [97.5 F (36.4 C)-97.9 F (36.6 C)] 97.7 F (36.5 C) (01/26 0441) Pulse Rate:  [65-86] 73 (01/26 0812) Resp:  [12-26] 18 (01/26 0812) BP: (89-134)/(46-79) 105/65 (01/26 0812) SpO2:  [94 %-100 %] 99 % (01/26 0812) Weight:  [208 lb 9.6 oz (94.6 kg)] 208 lb 9.6 oz (94.6 kg) (01/26 0441) Last BM Date: 03/16/16  Weight change: Filed Weights   03/18/16 0423 03/19/16 0300 03/20/16 0441  Weight: 211 lb 11.2 oz (96 kg) 211 lb 2.7 oz (95.8 kg) 208 lb 9.6 oz (94.6 kg)    Intake/Output:   Intake/Output Summary (Last 24 hours) at 03/20/16 0831 Last data filed at 03/20/16 0645  Gross per 24 hour  Intake           1862.3 ml  Output             3275 ml  Net          -1412.7 ml     Physical Exam: General: Well appearing. NAD.    HEENT: Normal.  Neck: supple. JVD 6-7 cm cm. Carotids 2+  bilat; no bruits. No thyromegaly or nodule noted.  Cor: PMI nondisplaced. Regular. No M/G/R Lungs: CTAB, normal effort Abdomen: soft, NT, ND, no HSM. No bruits or masses. +BS  Extremities: no cyanosis, clubbing, rash. Trace ankle edema. Slightly cool.  Neuro: alert & oriented x 3, cranial nerves grossly intact. moves all 4 extremities w/o difficulty. Affect pleasant.  Telemetry: Reviewed, A sensed V paced.   Labs: CBC  Recent Labs  03/19/16 0614  WBC 5.1  HGB 9.4*  HCT 28.0*  MCV 93.3  PLT 266   Basic Metabolic Panel  Recent Labs  03/19/16 0614 03/20/16 0503  NA 135 135  K 3.9 4.4  CL 99* 97*  CO2 28 29  GLUCOSE 89 93  BUN 20 18  CREATININE 1.04 1.28*  CALCIUM 8.8* 9.2   Liver Function Tests No results for input(s): AST, ALT, ALKPHOS, BILITOT, PROT, ALBUMIN in the last 72 hours. No results for input(s): LIPASE, AMYLASE in the last 72 hours. Cardiac Enzymes No results for input(s): CKTOTAL, CKMB, CKMBINDEX, TROPONINI in the last 72 hours.  BNP: BNP (last 3 results)  Recent Labs  01/13/16 1008 01/27/16 1155 03/17/16 0311  BNP 256.0*  595.7* 894.5*    ProBNP (last 3 results) No results for input(s): PROBNP in the last 8760 hours.   D-Dimer No results for input(s): DDIMER in the last 72 hours. Hemoglobin A1C No results for input(s): HGBA1C in the last 72 hours. Fasting Lipid Panel No results for input(s): CHOL, HDL, LDLCALC, TRIG, CHOLHDL, LDLDIRECT in the last 72 hours. Thyroid Function Tests  Recent Labs  03/19/16 0614  TSH 5.519*  T3FREE 3.1    Other results:     Imaging/Studies:  No results found.    Medications:     Scheduled Medications: . [MAR Hold] amiodarone  200 mg Oral Daily  . [MAR Hold] aspirin EC  81 mg Oral Daily  . [MAR Hold] atorvastatin  40 mg Oral q1800  . bisoprolol  2.5 mg Oral Daily  . [MAR Hold] cefTRIAXone (ROCEPHIN)  IV  2 g Intravenous Q24H  . [MAR Hold] clopidogrel  75 mg Oral Daily  . [MAR Hold]  digoxin  0.125 mg Oral QODAY  . [MAR Hold] enoxaparin (LOVENOX) injection  40 mg Subcutaneous Q24H  . [MAR Hold] famotidine  20 mg Oral BID  . [MAR Hold] furosemide  40 mg Oral BID  . [MAR Hold] losartan  25 mg Oral Daily  . [MAR Hold] magnesium oxide  400 mg Oral Daily  . [MAR Hold] mexiletine  200 mg Oral Q12H  . [MAR Hold] nicotine  21 mg Transdermal Daily  . [MAR Hold] PARoxetine  10 mg Oral Daily  . [MAR Hold] sodium chloride flush  3 mL Intravenous Q12H  . sodium chloride flush  3 mL Intravenous Q12H  . [MAR Hold] sodium chloride flush  3 mL Intravenous Q12H  . [MAR Hold] spironolactone  12.5 mg Oral QHS    Infusions: . sodium chloride 10 mL/hr at 03/19/16 2341  . sodium chloride      PRN Medications: [MAR Hold] sodium chloride, sodium chloride, [MAR Hold] acetaminophen, [MAR Hold] albuterol, [MAR Hold] ALPRAZolam, [MAR Hold] cyclobenzaprine, [MAR Hold] hydrOXYzine, [MAR Hold] nitroGLYCERIN, [MAR Hold] ondansetron (ZOFRAN) IV, [MAR Hold] oxyCODONE-acetaminophen, [MAR Hold] sodium chloride flush, [MAR Hold] sodium chloride flush, sodium chloride flush, [MAR Hold] sodium chloride flush, [MAR Hold] traMADol   Assessment/Plan   1. Acute on chronic systolic CHF: Echo 11/2015 LVEF 15%, ischemic cardiomyopathy.  Initially on milrinone and diuresed, now off milrinone and back on po Lasix.  See RHC above => filling pressures optimized and CO preserved off milrinone.  I wonder if his initial malaise/fatigue may have been due to indolent infection with Coag negative Staph? Long-term, I am still concerned about him.  We have begun LVAD workup.  - He can stay off milrinone at this point.   - Continue po lasix 40 mg BID.  - Can go back on home dose bisoprolol 2.5 mg daily.    - Continue spiro 25 mg daily - Continue losartan 25 mg daily.  No room this am to uptitrate or switch to [employer]. - Continue digoxin 0.125 mg daily. Level 0.2 on admit.  - On going work up for LVAD, I think this may be  in the cards for him eventually.  He may end up being a transplant candidate down the road, just stopped smoking. VAD coordinator and Dr. Laneta SimmersBartle have seen. HFSW to plan family meeting today.   2. CAD: Recent LHC in 12/17 with stable coronary disease  - Continue statin, ASA, Plavix.   3. Ascending aortic aneurysm - 4.1 cm 8/17. No evidence of dissection on CTA  03/17/16.  4. VT storm in 12/17: Continue amiodarone and Mexiletine. EP has seen 03/18/16. No plan to re-activate LV lead at this time. There is a strong chance it could trigger VT again. - No VT on tele.    5. Low back pain:  Suspect pain may be purely muscular.  No discitis/abscess on CT abdomen/pelvis.  - Continue flexeril and tramadol prn.  6. 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  LV lead is off due to possible pro-arrhythmia.  He does have some RV pacing.  As above, discussed with EP and plan to keep LV lead off.  7. ID: Blood cultures (done for back pain, ?discitis given concerns about ICD pocket) returned 2/2 coag negative Staph.  There are ongoing concerns regarding ICD pocket.  ID has seen.  - Continue ceftriaxone IV per ID.  Blood cultures re-drawn yesterday.  - Will arrange for TEE today to assess the ICD leads and valve.  Will make decision on length of abx post- TEE.    Marca Ancona 03/20/2016 8:31 AM

## 2016-03-20 NOTE — CV Procedure (Signed)
    Transesophageal Echocardiogram Note  Crista LuriaLewis R Witham [id number] 08/31/1955  Procedure: Transesophageal Echocardiogram Indications: bacteremia   Procedure Details Consent: Obtained Time Out: Verified patient identification, verified procedure, site/side was marked, verified correct patient position, special equipment/implants available, Radiology Safety Procedures followed,  medications/allergies/relevent history reviewed, required imaging and test results available.  Performed  Medications:  During this procedure the patient is administered a total of Versed 4 mg and Fentanyl 25 mcg  to achieve and maintain moderate conscious sedation.  The patient's heart rate, blood pressure, and oxygen saturation are monitored continuously during the procedure. The period of conscious sedation is 30 minutes, of which I was present face-to-face 100% of this time.  Left Ventrical:  Severe LV dysfunction - EF 15-20%  Mitral Valve: moderate - severe MR.   No vegetation   Aortic Valve: no vegetation, moderate AI   Tricuspid Valve: mild TR   Pulmonic Valve: no significant PI  Left Atrium/ Left atrial appendage: no thrombi   Atrial septum: no PFO or ASD by color doppler   Aorta: mild plaque  Pacing wire:  No evidence of vegetation    Complications: No apparent complications Patient did tolerate procedure well.   Vesta MixerPhilip J. Dreana Britz, Montez HagemanJr., MD, [facility] 03/20/2016, 11:53 AM

## 2016-03-20 NOTE — Progress Notes (Signed)
Day of Surgery Procedure(s) (LRB): TRANSESOPHAGEAL ECHOCARDIOGRAM (TEE) (N/A) Subjective:  Feels ok today off Milrinone. BP 90/58. AV pacing at 60.  RHC looked good with CO 3.08 off milrinone and optimized filling pressures. TEE shows no vegetation on valves or leads. EF 15%. Moderate to severe MR, mod AI, mild TR.  Back pain about the same   Objective: Vital signs in last 24 hours: Temp:  [97.4 F (36.3 C)-98.4 F (36.9 C)] 97.4 F (36.3 C) (01/26 1500) Pulse Rate:  [58-86] 58 (01/26 1500) Cardiac Rhythm: Ventricular paced (01/26 1230) Resp:  [0-26] 20 (01/26 1500) BP: (90-134)/(50-79) 90/58 (01/26 1500) SpO2:  [94 %-100 %] 97 % (01/26 1500) Weight:  [94.6 kg (208 lb 9.6 oz)] 94.6 kg (208 lb 9.6 oz) (01/26 1105)  Hemodynamic parameters for last 24 hours: CVP:  [8 mmHg] 8 mmHg  Intake/Output from previous day: 01/25 0701 - 01/26 0700 In: 1862.3 [P.O.:1076; I.V.:736.3; IV Piggyback:50] Out: 3275 [Urine:3275] Intake/Output this shift: Total I/O In: 169.2 [I.V.:119.2; IV Piggyback:50] Out: -   General appearance: alert and cooperative Heart: regular rate and rhythm, S1, S2 normal, no murmur, click, rub or gallop Lungs: clear to auscultation bilaterally Extremities: edema minimal in ankles  Lab Results:  Recent Labs  03/19/16 0614  WBC 5.1  HGB 9.4*  HCT 28.0*  PLT 266   BMET:  Recent Labs  03/19/16 0614 03/20/16 0503  NA 135 135  K 3.9 4.4  CL 99* 97*  CO2 28 29  GLUCOSE 89 93  BUN 20 18  CREATININE 1.04 1.28*  CALCIUM 8.8* 9.2    PT/INR:  Recent Labs  03/20/16 0503  LABPROT 13.7  INR 1.05   ABG    Component Value Date/Time   PHART 7.247 (L) 01/27/2016 1655   HCO3 21.2 01/27/2016 1655   TCO2 25 10/29/2015 1559   ACIDBASEDEF 4.9 (H) 01/27/2016 1655   O2SAT 53.2 03/20/2016 0500   CBG (last 3)  No results for input(s): GLUCAP in the last 72 hours.  Assessment/Plan:  He is hemodynamically stable with good CO off milrinone today. BP on  the low side.  I think he will need a VAD at some point in the not too distant future but there is time to resolve this infectious process. Still not clear but suspicious for ICD infection given the appearance of the pocket. Will see what Dr. Graciela HusbandsKlein thinks. He is on antibiotics and follow [facility].  LOS: 3 days    Alleen BorneBryan K Ambreen Tufte 03/20/2016

## 2016-03-21 ENCOUNTER — Inpatient Hospital Stay (HOSPITAL_COMMUNITY): Payer: Medicaid Other

## 2016-03-21 ENCOUNTER — Encounter (HOSPITAL_COMMUNITY): Payer: Self-pay | Admitting: Student

## 2016-03-21 DIAGNOSIS — Z01810 Encounter for preprocedural cardiovascular examination: Secondary | ICD-10-CM

## 2016-03-21 DIAGNOSIS — M5440 Lumbago with sciatica, unspecified side: Secondary | ICD-10-CM

## 2016-03-21 LAB — CBC
HCT: 30 % — ABNORMAL LOW (ref 39.0–52.0)
Hemoglobin: 9.9 g/dL — ABNORMAL LOW (ref 13.0–17.0)
MCH: 30.7 pg (ref 26.0–34.0)
MCHC: 33 g/dL (ref 30.0–36.0)
MCV: 93.2 fL (ref 78.0–100.0)
Platelets: 261 10*3/uL (ref 150–400)
RBC: 3.22 MIL/uL — AB (ref 4.22–5.81)
RDW: 15.1 % (ref 11.5–15.5)
WBC: 6.1 10*3/uL (ref 4.0–10.5)

## 2016-03-21 LAB — GLUCOSE, CAPILLARY: Glucose-Capillary: 98 mg/dL (ref 65–99)

## 2016-03-21 LAB — CULTURE, BLOOD (ROUTINE X 2)

## 2016-03-21 LAB — VAS US DOPPLER PRE VAD
LCCADDIAS: -16 cm/s
LCCADSYS: -98 cm/s
LCCAPDIAS: 21 cm/s
LCCAPSYS: 103 cm/s
LEFT ECA DIAS: -11 cm/s
LEFT VERTEBRAL DIAS: 13 cm/s
LICADDIAS: -23 cm/s
LICADSYS: -83 cm/s
LICAPDIAS: -29 cm/s
LICAPSYS: -90 cm/s
RCCAPSYS: 94 cm/s
RIGHT ECA DIAS: -21 cm/s
RIGHT VERTEBRAL DIAS: 12 cm/s
Right CCA prox dias: 11 cm/s
Right cca dist sys: -75 cm/s

## 2016-03-21 LAB — BASIC METABOLIC PANEL
Anion gap: 10 (ref 5–15)
BUN: 21 mg/dL — ABNORMAL HIGH (ref 6–20)
CHLORIDE: 98 mmol/L — AB (ref 101–111)
CO2: 29 mmol/L (ref 22–32)
CREATININE: 1.27 mg/dL — AB (ref 0.61–1.24)
Calcium: 9.1 mg/dL (ref 8.9–10.3)
GFR calc non Af Amer: 58 mL/min — ABNORMAL LOW (ref >60)
Glucose, Bld: 99 mg/dL (ref 65–99)
POTASSIUM: 3.9 mmol/L (ref 3.5–5.1)
Sodium: 137 mmol/L (ref 135–145)

## 2016-03-21 LAB — COOXEMETRY PANEL
Carboxyhemoglobin: 1.4 % (ref 0.5–1.5)
METHEMOGLOBIN: 1.2 % (ref 0.0–1.5)
O2 Saturation: 60.3 %
TOTAL HEMOGLOBIN: 10.3 g/dL — AB (ref 12.0–16.0)

## 2016-03-21 MED ORDER — LIDOCAINE 5 % EX PTCH
1.0000 | MEDICATED_PATCH | CUTANEOUS | Status: DC
  Administered 2016-03-21 – 2016-03-28 (×5): 1 via TRANSDERMAL
  Filled 2016-03-21 (×10): qty 1

## 2016-03-21 MED ORDER — MILK AND MOLASSES ENEMA
1.0000 | Freq: Once | RECTAL | Status: AC
  Administered 2016-03-21: 250 mL via RECTAL
  Filled 2016-03-21: qty 250

## 2016-03-21 NOTE — Progress Notes (Signed)
Pt disimpacted, removed about the size of a golf ball. Molasses enema given after disimpaction. Pt tolerated well and was able to have BM  about 20 minutes after enema.

## 2016-03-21 NOTE — Progress Notes (Signed)
Initial Nutrition Assessment  DOCUMENTATION CODES:   Not applicable  INTERVENTION:  -Cater to pt preferences -Reinforced importance of adequate nutrition; discussed possible addition of snacks between meals and/or nutritional supplement if appetite decreases with decline in po intake in future -Reinforced heart healthy diet; pt reports he has is familiar with diet and has no questions at this time   NUTRITION DIAGNOSIS:   Increased nutrient needs related to chronic illness (pending surgery) as evidenced by estimated needs.  GOAL:   Patient will meet greater than or equal to 90% of their needs  MONITOR:   PO intake, Labs, Weight trends  REASON FOR ASSESSMENT:   Consult Assessment of nutrition requirement/status (LVAD)  ASSESSMENT:    61 yo male admitted with SOB with weight gain of 8 pounds with acute onchronic CHF. ECHO 11/2015 with LVEF 15% 2/2 ICM. Pt being evaluated for LVAD. Pt also with bactermia, TEE without vegetation. Pt with additional hx of smoking CAD s/p multiple PCIs, MI, TIA, depression  Pt with good appetite, ate 100% at lunch today, reports good breakfast as well. Pt reports good appetite prior to admission as well. Pt denies wt loss, 8 pound wt gain noted.   Nutrition-Focused physical exam completed. Findings are WDL for fat depletion, muscle depletion, and edema.   Pt with poor dentition, missing multiple teeth but reports no problems chewing. Pt does complain that intermittently it feels like food gets stuck in his chest although this has not happened recently. Encouraged pt to chew food well, take sips of liquid after each bite.   Labs: reviewed Meds: lasix, digoxin, plavix, lipitor  Diet Order:  Diet Heart Room service appropriate? Yes; Fluid consistency: Thin  Skin:  Reviewed, no issues  Last BM:  03/16/16  Height:   Ht Readings from Last 1 Encounters:  03/20/16 5\' 11"  (1.803 m)    Weight:   Wt Readings from Last 1 Encounters:  03/21/16  207 lb 9.6 oz (94.2 kg)   BMI:  Body mass index is 28.95 kg/m.  Estimated Nutritional Needs:   Kcal:  2100-2300 kcals  Protein:  110-130 g   Fluid:  >/= 1.5 L  EDUCATION NEEDS:   Education needs addressed  Romelle Starcherate Gricelda Foland MS, RD, LDN [phone] Pager  [phone] Weekend/On-Call Pager

## 2016-03-21 NOTE — Progress Notes (Signed)
[facility]  Day #3 of abtx - ctx  Reason for visit: Follow up on bacteremia  24hr event: remains afebrile, he doesn't recall his pacemaker pocket being irritated though he states that it has fluctuance recently.   Addendum: RN paged me to state that the ICD pocket is starting to drain serosanguinous fluid  Interval History: repeat blood cx from 1/25 NGTD at 40 hr ; TEE without vegetation,   Physical Exam: Constitutional:  Vitals:   03/21/16 0945 03/21/16 0947  BP: 140/60   Pulse:  71  Resp:    Temp:     patient appears in NAD Respiratory: Normal respiratory effort; CTA B Chest wall: left corner pocket of ICD has healing scab no surrounding erythema or drainage Cardiovascular: RRR; pocket with erosion  Review of Systems: Constitutional: negative for fevers and chills Gastrointestinal: negative for diarrhea  Lab Results  Component Value Date   WBC 6.1 03/21/2016   HGB 9.9 (L) 03/21/2016   HCT 30.0 (L) 03/21/2016   MCV 93.2 03/21/2016   PLT 261 03/21/2016    Lab Results  Component Value Date   CREATININE 1.27 (H) 03/21/2016   BUN 21 (H) 03/21/2016   NA 137 03/21/2016   K 3.9 03/21/2016   CL 98 (L) 03/21/2016   CO2 29 03/21/2016    Lab Results  Component Value Date   ALT 17 03/17/2016   AST 17 03/17/2016   ALKPHOS 47 03/17/2016     Microbiology: Recent Results (from the past 240 hour(s))  MRSA PCR Screening     Status: None   Collection Time: 03/17/16  5:15 PM  Result Value Ref Range Status   MRSA by PCR NEGATIVE NEGATIVE Final    Comment:        The GeneXpert MRSA Assay (FDA approved for NASAL specimens only), is one component of a comprehensive MRSA colonization surveillance program. It is not intended to diagnose MRSA infection nor to guide or monitor treatment for MRSA infections.   Culture, blood (Routine X 2) w Reflex to ID Panel     Status: Abnormal   Collection Time: 03/18/16 10:00 AM  Result Value Ref Range  Status   Specimen Description BLOOD LEFT ANTECUBITAL  Final   Special Requests IN PEDIATRIC BOTTLE  2CC  Final   Culture  Setup Time   Final    GRAM POSITIVE COCCI IN CLUSTERS IN PEDIATRIC BOTTLE CRITICAL VALUE NOTED.  VALUE IS CONSISTENT WITH PREVIOUSLY REPORTED AND CALLED VALUE.    Culture (A)  Final    STAPHYLOCOCCUS EPIDERMIDIS SUSCEPTIBILITIES PERFORMED ON PREVIOUS CULTURE WITHIN THE LAST 5 DAYS.    Report Status 03/21/2016 FINAL  Final  Culture, blood (Routine X 2) w Reflex to ID Panel     Status: Abnormal   Collection Time: 03/18/16 10:06 AM  Result Value Ref Range Status   Specimen Description BLOOD LEFT HAND  Final   Special Requests IN PEDIATRIC BOTTLE  3CC  Final   Culture  Setup Time   Final    GRAM POSITIVE COCCI IN CLUSTERS IN PEDIATRIC BOTTLE CRITICAL RESULT CALLED TO, READ BACK BY AND VERIFIED WITH: PHARMD A JOHNSTON [id number] MLM    Culture STAPHYLOCOCCUS EPIDERMIDIS (A)  Final   Report Status 03/21/2016 FINAL  Final   Organism ID, Bacteria STAPHYLOCOCCUS EPIDERMIDIS  Final      Susceptibility   Staphylococcus epidermidis - MIC*    CIPROFLOXACIN <=0.5 SENSITIVE Sensitive     ERYTHROMYCIN <=0.25 SENSITIVE Sensitive  GENTAMICIN <=0.5 SENSITIVE Sensitive     OXACILLIN <=0.25 SENSITIVE Sensitive     TETRACYCLINE <=1 SENSITIVE Sensitive     VANCOMYCIN 1 SENSITIVE Sensitive     TRIMETH/SULFA <=10 SENSITIVE Sensitive     CLINDAMYCIN <=0.25 SENSITIVE Sensitive     RIFAMPIN <=0.5 SENSITIVE Sensitive     Inducible Clindamycin NEGATIVE Sensitive     * STAPHYLOCOCCUS EPIDERMIDIS  Blood Culture ID Panel (Reflexed)     Status: Abnormal   Collection Time: 03/18/16 10:06 AM  Result Value Ref Range Status   Enterococcus species NOT DETECTED NOT DETECTED Final   Listeria monocytogenes NOT DETECTED NOT DETECTED Final   Staphylococcus species DETECTED (A) NOT DETECTED Final    Comment: CRITICAL RESULT CALLED TO, READ BACK BY AND VERIFIED WITH: PHARMD A JOHNSTON  [id number] MLM    Staphylococcus aureus NOT DETECTED NOT DETECTED Final   Methicillin resistance NOT DETECTED NOT DETECTED Final   Streptococcus species NOT DETECTED NOT DETECTED Final   Streptococcus agalactiae NOT DETECTED NOT DETECTED Final   Streptococcus pneumoniae NOT DETECTED NOT DETECTED Final   Streptococcus pyogenes NOT DETECTED NOT DETECTED Final   Acinetobacter baumannii NOT DETECTED NOT DETECTED Final   Enterobacteriaceae species NOT DETECTED NOT DETECTED Final   Enterobacter cloacae complex NOT DETECTED NOT DETECTED Final   Escherichia coli NOT DETECTED NOT DETECTED Final   Klebsiella oxytoca NOT DETECTED NOT DETECTED Final   Klebsiella pneumoniae NOT DETECTED NOT DETECTED Final   Proteus species NOT DETECTED NOT DETECTED Final   Serratia marcescens NOT DETECTED NOT DETECTED Final   Haemophilus influenzae NOT DETECTED NOT DETECTED Final   Neisseria meningitidis NOT DETECTED NOT DETECTED Final   Pseudomonas aeruginosa NOT DETECTED NOT DETECTED Final   Candida albicans NOT DETECTED NOT DETECTED Final   Candida glabrata NOT DETECTED NOT DETECTED Final   Candida krusei NOT DETECTED NOT DETECTED Final   Candida parapsilosis NOT DETECTED NOT DETECTED Final   Candida tropicalis NOT DETECTED NOT DETECTED Final  Culture, blood (routine x 2)     Status: None (Preliminary result)   Collection Time: 03/19/16 11:34 PM  Result Value Ref Range Status   Specimen Description BLOOD LEFT ARM  Final   Special Requests BOTTLES DRAWN AEROBIC AND ANAEROBIC  Final   Culture NO GROWTH 1 DAY  Final   Report Status PENDING  Incomplete  Culture, blood (routine x 2)     Status: None (Preliminary result)   Collection Time: 03/19/16 11:43 PM  Result Value Ref Range Status   Specimen Description BLOOD LEFT HAND  Final   Special Requests AEROBIC BOTTLE ONLY  Final   Culture NO GROWTH 1 DAY  Final   Report Status PENDING  Incomplete    Impression/Plan:   1. bacteremia - Staph  epidermidis in 2/2 on 1/24, cultures were drawn after picc line was placed on 1/23.  TEE without vegetation on leads. Highly suspicious for cardiac device infection/ICD pocket, especially since it is draining. I have contacted Dr Graciela Husbands, EP, agrees that ICD device extraction is sooner than later. He will coordinate with Dr Ladona Ridgel for early next week   - Will continue with ceftriaxone 2gm IV daily. Ideally would also add rifampin due to ICD but he has numerous medications, including amio and dig that would interact. Since device is going to be removed in a few days, can hold off on rifampin - would keep patient until blood cx are NGTD at 72 hr. - since picc line placed while patient  was bacteremic, ideally would also have it exchanged once ICD is also removed - plan for 4 wk of IV abtx for pacemaker pocket infection  2. Back pain - imaging not suggestive of discitis. Would continue with management of msk either with muscle relaxant +/- topical ointment

## 2016-03-21 NOTE — Progress Notes (Signed)
Pt went to the bathroom at shift change. He was trying to have a bowel movement. Pt was found on the toilet and was diaphoretic. Pt was assisted back to bed. Bp went up to the 118/100. Pt stated he need to have a bowel movement but it was stuck. Md on call paged. New order received for an enema. On coming nurse updated who will cont to monitor pt.

## 2016-03-21 NOTE — Consult Note (Signed)
Consultation Note Date: 03/21/2016   Patient Name: [name]  DOB: [birth year]  MRN: [id number]  Age / Sex: 61 y.o., male  PCP: Andreas Blower, MD Referring Physician: Larey Dresser, MD  Reason for Consultation: VAD evaluation  HPI/Patient Profile: 61 y.o. male  with past medical history of systolic CHF (EF 93%), CAD, s/p Medtronic AICD, depression, anxiety, HLD, HTN, TIA admitted on 03/17/2016 with heart failure exacerbation, bacteremia and ICD likely to require removal, followed by ID. Currently being evaluated for potential VAD candidacy.   Clinical Assessment and Goals of Care: I met today with [name]. No family at bedside. [name] has just recently been educated on LVAD option and work up just started. He has not yet met an LVAD patient.   [name] is a lovely and pleasant gentleman. We discussed LVAD options benefits vs risks and life changes. We also discussed care givers and assistance needed with LVAD. He lives with his sister but she leaves during the day to go care for their elderly mother. He has 3 daughters within ~20 minutes but he is concerned about being a burden on them.   Much of our conversation he was tearful and fairly anxious. He is fearful of returning home. He has PTSD from Dec 2017 when he says his AICD shocked him 45 times. He is scared to be alone and cannot go into his garage with his beloved motorcycle as this is where he was shocked. We discussed strategies to manage his anxiety and brainstormed options to help. Recommend First Alert necklace. Mentioned him going with his sister to his mother's but "I am a home body." Encouraged counseling to help and he says he has tried this once and did not have any benefit.   A lot of his anxiety comes from loneliness and fear of abandonment. His wife left him ~2 years ago and he has noted increased anxiety since and this was greatly  exacerbated by his AICD firing. He mentioned that his ex-wife offered to help him but he does not feel that he could trust her to stick around and follow through. His daughters would have to be his support person. We also briefly discussed VAD support group.   He is hopeful for VAD and for improved quantity and QOL. He would like to be able to ride his motorcycle and spend more time with his family. He believes he has worked on [employer] and his daughters are helping with this and he will look into with his daughters. He tells me that he would like more time but says he has strong faith and "I know where I'm going when I die."  Primary Decision Maker NEXT OF KIN 3 daughters are his surrogate decision makers    SUMMARY OF RECOMMENDATIONS   - Seems appropriate VAD candidate if daughters able to support him during recovery  - Recommend counseling/therapy to help with his anxiety and likely treatment for depression/PTSD (Rec increase Paxil) - I do worry how he would psychologically cope  with VAD given his current level of distress and anxiety  Code Status/Advance Care Planning:  Full code   Symptom Management:   Anxiety/depression: Recommend increase Paxil from 10 mg to 20 mg daily.   Chronic back pain: Ortho evaluating with recs.   Palliative Prophylaxis:   Bowel Regimen, Delirium Protocol and Frequent Pain Assessment  Additional Recommendations (Limitations, Scope, Preferences):  Full Scope Treatment  Psycho-social/Spiritual:   Desire for further Chaplaincy support:yes  Additional Recommendations: Caregiving  Support/Resources  Prognosis:   Unable to determine  Discharge Planning: To Be Determined      Primary Diagnoses: Present on Admission: . Systolic CHF, acute on chronic (HCC)   I have reviewed the medical record, interviewed the patient and family, and examined the patient. The following aspects are pertinent.  Past Medical History:  Diagnosis Date  .  Anxiety   . Automatic implantable cardioverter-defibrillator in situ    a. Medtronic ICD.  [name] CAD (coronary artery disease)    a.  Severe LAD stenosis 2/2 acute thrombus - BMS 2009;  b. 06/01/11 Cath - LAD 20 isr, 71m(3.0x16 Veri-flex BMS), OM1 100p, EF 20-25%;  c. 07/2012 Abnl Cardiolite;  d. 08/2012 Cath/PCI: LM nl, LAD patent stents, D2 80-90 (jailed), LCX 90 p/m (4.0x24 Promus Premier DES), OM1 100, OM2 80-90p (3.0x20 Promus Premier DES), RCA patent mid stent, 40d, EF 25%.  e. 10/29/15 LHC stable disease with patent stents  . Chronic systolic CHF (congestive heart failure), NYHA class 3 (HCC) - low output state 12/12/2014  . Depression   . Dyspnea   . History of rheumatic fever 1962  . HLD (hyperlipidemia) mixed  . HTN (hypertension)   . Ischemic cardiomyopathy    a.  EF 30-35% 2010;  b.  EF 20-25% by LV gram 08/2012. c. EF 15% by echo 11/2014.  .Marland KitchenMyocardial infarction 1998; 2002; ~ 2010  . Paroxysmal ventricular tachycardia (HCC)    a. VFlutter  CL 210 msec  Rx shock 04/2011  . PVC's (premature ventricular contractions)   . RBBB   . TIA (transient ischemic attack)    a. Dx 11/2014, continued on ASA/Plavix.   Social History   Social History  . Marital status: Legally Separated    Spouse name: N/A  . Number of children: N/A  . Years of education: N/A   Social History Main Topics  . Smoking status: Former Smoker    Packs/day: 1.00    Years: 47.00    Types: Cigarettes    Quit date: 11/20/2015  . Smokeless tobacco: Never Used  . Alcohol use 3.6 oz/week    6 Cans of beer per week  . Drug use: No  . Sexual activity: Not Currently   Other Topics Concern  . None   Social History Narrative   Married; full time.    Family History  Problem Relation Age of Onset  . Cancer Father   . Cancer Sister     twin sister and only one has cancer, unknown what kind  . Hypertension Brother   . Cancer      family hx  . Heart attack Neg Hx   . Stroke Neg Hx    Scheduled Meds: . amiodarone   200 mg Oral Daily  . aspirin EC  81 mg Oral Daily  . atorvastatin  40 mg Oral q1800  . bisoprolol  2.5 mg Oral Daily  . cefTRIAXone (ROCEPHIN)  IV  2 g Intravenous Q24H  . clopidogrel  75 mg Oral Daily  .  digoxin  0.125 mg Oral QODAY  . famotidine  20 mg Oral BID  . furosemide  40 mg Oral BID  . heparin  5,000 Units Subcutaneous Q8H  . losartan  25 mg Oral Daily  . magnesium oxide  400 mg Oral Daily  . mexiletine  200 mg Oral Q12H  . nicotine  21 mg Transdermal Daily  . PARoxetine  10 mg Oral Daily  . sodium chloride flush  3 mL Intravenous Q12H  . sodium chloride flush  3 mL Intravenous Q12H  . sodium chloride flush  3 mL Intravenous Q12H  . sodium chloride flush  3 mL Intravenous Q12H  . spironolactone  25 mg Oral QHS   Continuous Infusions: . sodium chloride     PRN Meds:.sodium chloride, sodium chloride, sodium chloride, acetaminophen, albuterol, ALPRAZolam, cyclobenzaprine, hydrOXYzine, nitroGLYCERIN, ondansetron (ZOFRAN) IV, ondansetron (ZOFRAN) IV, oxyCODONE, oxyCODONE-acetaminophen, sodium chloride flush, sodium chloride flush, sodium chloride flush, sodium chloride flush, sodium chloride flush, traMADol No Known Allergies Review of Systems  Constitutional: Positive for activity change, appetite change and fatigue.  Psychiatric/Behavioral: Negative for sleep disturbance. The patient is nervous/anxious.     Physical Exam  Constitutional: He is oriented to person, place, and time. He appears well-developed.  HENT:  Head: Normocephalic and atraumatic.  Cardiovascular: Normal rate.   V paced  Pulmonary/Chest: Effort normal. No accessory muscle usage. No tachypnea. No respiratory distress.  Abdominal: Soft. Normal appearance.  Neurological: He is alert and oriented to person, place, and time.  Psychiatric: His mood appears anxious. He exhibits a depressed mood.  Very tearful. Fearful to return home.   Nursing note and vitals reviewed.   Vital Signs: BP 140/60 (BP  Location: Right Arm)   Pulse 71   Temp 97.5 F (36.4 C) (Oral)   Resp 14   Ht 5' 11"  (1.803 m)   Wt 94.2 kg (207 lb 9.6 oz)   SpO2 100%   BMI 28.95 kg/m  Pain Assessment: 0-10 POSS *See Group Information*: S-Acceptable,Sleep, easy to arouse Pain Score: 0-No pain   SpO2: SpO2: 100 % O2 Device:SpO2: 100 % O2 Flow Rate: .O2 Flow Rate (L/min): 3 L/min  IO: Intake/output summary:  Intake/Output Summary (Last 24 hours) at 03/21/16 1254 Last data filed at 03/21/16 0902  Gross per 24 hour  Intake              360 ml  Output              600 ml  Net             -240 ml    LBM: Last BM Date: 03/16/16 Baseline Weight: Weight: 95.9 kg (211 lb 6.4 oz) Most recent weight: Weight: 94.2 kg (207 lb 9.6 oz)     Palliative Assessment/Data:     Time In: 1130 Time Out: 1240 Time Total: 41mn Greater than 50%  of this time was spent counseling and coordinating care related to the above assessment and plan.  Signed by: AVinie Sill NP Palliative Medicine Team Pager # [phone](M-F 8a-5p) Team Phone # [phone](Nights/Weekends)

## 2016-03-21 NOTE — Consult Note (Signed)
Reason for Consult:LBP Referring Physician: Dr. Rosezella [place] is an 61 y.o. male.  HPI: Pt currently admitted with bacteremia, heart failure. Ortho consulted for LBP. Pt reports pain since Monday, no injury. Has had prior episodes of back pain in the past but none recently. Reports pain localized in the lower back. Denies radiating leg pain, buttock or hip pain, numbness, tingling, weakness, bowel/bladder incontinence. Had been unable to get out of bed due to pain but was able today to get up to a chair.  Past Medical History:  Diagnosis Date  . Anxiety   . Automatic implantable cardioverter-defibrillator in situ    a. Medtronic ICD.  [name] CAD (coronary artery disease)    a.  Severe LAD stenosis 2/2 acute thrombus - BMS 2009;  b. 06/01/11 Cath - LAD 20 isr, 54m(3.0x16 Veri-flex BMS), OM1 100p, EF 20-25%;  c. 07/2012 Abnl Cardiolite;  d. 08/2012 Cath/PCI: LM nl, LAD patent stents, D2 80-90 (jailed), LCX 90 p/m (4.0x24 Promus Premier DES), OM1 100, OM2 80-90p (3.0x20 Promus Premier DES), RCA patent mid stent, 40d, EF 25%.  e. 10/29/15 LHC stable disease with patent stents  . Chronic systolic CHF (congestive heart failure), NYHA class 3 (HCC) - low output state 12/12/2014  . Depression   . Dyspnea   . History of rheumatic fever 1962  . HLD (hyperlipidemia) mixed  . HTN (hypertension)   . Ischemic cardiomyopathy    a.  EF 30-35% 2010;  b.  EF 20-25% by LV gram 08/2012. c. EF 15% by echo 11/2014.  .Marland KitchenMyocardial infarction 1998; 2002; ~ 2010  . Paroxysmal ventricular tachycardia (HCC)    a. VFlutter  CL 210 msec  Rx shock 04/2011  . PVC's (premature ventricular contractions)   . RBBB   . TIA (transient ischemic attack)    a. Dx 11/2014, continued on ASA/Plavix.    Past Surgical History:  Procedure Laterality Date  . CARDIAC CATHETERIZATION N/A 10/29/2015   Procedure: Right/Left Heart Cath and Coronary Angiography;  Surgeon: Peter M JMartinique MD;  [facility];  Service:  Cardiovascular;  Laterality: N/A;  . CARDIAC CATHETERIZATION N/A 03/20/2016   Procedure: Right Heart Cath;  Surgeon: DLarey Dresser MD;  [facility];  Service: Cardiovascular;  Laterality: N/A;  . CARDIAC DEFIBRILLATOR PLACEMENT     MDT single chamber primary prevention ICD implanted by Dr KCaryl Comesas part of MASTER study  . EP IMPLANTABLE DEVICE N/A 12/11/2015   Procedure: BiV Upgrade;  Surgeon: SDeboraha Sprang MD;  [facility];  Service: Cardiovascular;  Laterality: N/A;  . EP IMPLANTABLE DEVICE N/A 12/11/2015   Procedure: Pocket Revision/Relocation;  Surgeon: SDeboraha Sprang MD;  [facility];  Service: Cardiovascular;  Laterality: N/A;  . EP IMPLANTABLE DEVICE N/A 12/11/2015   Procedure: Lead Revision/Repair;  Surgeon: SDeboraha Sprang MD;  [facility];  Service: Cardiovascular;  Laterality: N/A;  . HERNIA REPAIR     "w/gallbladder OR" (08/29/2012)  . LAPAROSCOPIC CHOLECYSTECTOMY    . LEFT HEART CATHETERIZATION WITH CORONARY ANGIOGRAM N/A 06/08/2011   Procedure: LEFT HEART CATHETERIZATION WITH CORONARY ANGIOGRAM;  Surgeon: DJolaine Artist MD;  [facility];  Service: Cardiovascular;  Laterality: N/A;  . PERCUTANEOUS CORONARY STENT INTERVENTION (PCI-S) N/A 06/01/2011   Procedure: PERCUTANEOUS CORONARY STENT INTERVENTION (PCI-S);  Surgeon: Peter M JMartinique MD;  [facility];  Service: Cardiovascular;  Laterality: N/A;  . PERCUTANEOUS CORONARY STENT  INTERVENTION (PCI-S) N/A 08/29/2012   Procedure: PERCUTANEOUS CORONARY STENT INTERVENTION (PCI-S);  Surgeon: Peter M Martinique, MD;  Location: [facility] CATH LAB;  Service: Cardiovascular;  Laterality: N/A;  . PERIPHERAL VASCULAR CATHETERIZATION  12/11/2015   Procedure: Upper Extremity Venography;  Surgeon: Deboraha Sprang, MD;  Location: [place] [facility];  Service: Cardiovascular;;    Family History  Problem Relation Age of Onset  . Cancer Father   . Cancer Sister     twin  sister and only one has cancer, unknown what kind  . Hypertension Brother   . Cancer      family hx  . Heart attack Neg Hx   . Stroke Neg Hx     Social History:  reports that he quit smoking about 4 months ago. His smoking use included Cigarettes. He has a 47.00 pack-year smoking history. He has never used smokeless tobacco. He reports that he drinks about 3.6 oz of alcohol per week . He reports that he does not use drugs.  Allergies: No Known Allergies  Medications: I have reviewed the patient's current medications.  Results for orders placed or performed during the hospital encounter of 03/17/16 (from the past 48 hour(s))  Culture, blood (routine x 2)     Status: None (Preliminary result)   Collection Time: 03/19/16 11:34 PM  Result Value Ref Range   Specimen Description BLOOD LEFT ARM    Special Requests BOTTLES DRAWN AEROBIC AND ANAEROBIC 10ML    Culture NO GROWTH 1 DAY    Report Status PENDING   Culture, blood (routine x 2)     Status: None (Preliminary result)   Collection Time: 03/19/16 11:43 PM  Result Value Ref Range   Specimen Description BLOOD LEFT HAND    Special Requests AEROBIC BOTTLE ONLY 10ML    Culture NO GROWTH 1 DAY    Report Status PENDING   Cooxemetry Panel (carboxy, met, total hgb, O2 sat)     Status: Abnormal   Collection Time: 03/20/16  5:00 AM  Result Value Ref Range   Total hemoglobin 11.6 (L) 12.0 - 16.0 g/dL   O2 Saturation 53.2 %   Carboxyhemoglobin 1.2 0.5 - 1.5 %   Methemoglobin 1.0 0.0 - 1.5 %  Basic metabolic panel     Status: Abnormal   Collection Time: 03/20/16  5:03 AM  Result Value Ref Range   Sodium 135 135 - 145 mmol/L   Potassium 4.4 3.5 - 5.1 mmol/L   Chloride 97 (L) 101 - 111 mmol/L   CO2 29 22 - 32 mmol/L   Glucose, Bld 93 65 - 99 mg/dL   BUN 18 6 - 20 mg/dL   Creatinine, Ser 1.28 (H) 0.61 - 1.24 mg/dL   Calcium 9.2 8.9 - 10.3 mg/dL   GFR calc non Af Amer 58 (L) >60 mL/min   GFR calc Af Amer >60 >60 mL/min    Comment:  (NOTE) The eGFR has been calculated using the CKD EPI equation. This calculation has not been validated in all clinical situations. eGFR's persistently <60 mL/min signify possible Chronic Kidney Disease.    Anion gap 9 5 - 15  Protime-INR     Status: None   Collection Time: 03/20/16  5:03 AM  Result Value Ref Range   Prothrombin Time 13.7 11.4 - 15.2 seconds   INR 1.05   I-STAT 3, venous blood gas (G3P V)     Status: Abnormal   Collection Time: 03/20/16  8:09 AM  Result Value Ref Range  pH, Ven 7.389 7.250 - 7.430   pCO2, Ven 48.5 44.0 - 60.0 mmHg   pO2, Ven 33.0 32.0 - 45.0 mmHg   Bicarbonate 29.3 (H) 20.0 - 28.0 mmol/L   TCO2 31 0 - 100 mmol/L   O2 Saturation 63.0 %   Acid-Base Excess 4.0 (H) 0.0 - 2.0 mmol/L   Patient temperature HIDE    Sample type VENOUS    Comment NOTIFIED PHYSICIAN   I-STAT 3, venous blood gas (G3P V)     Status: Abnormal   Collection Time: 03/20/16  8:09 AM  Result Value Ref Range   pH, Ven 7.383 7.250 - 7.430   pCO2, Ven 50.8 44.0 - 60.0 mmHg   pO2, Ven 32.0 32.0 - 45.0 mmHg   Bicarbonate 30.2 (H) 20.0 - 28.0 mmol/L   TCO2 32 0 - 100 mmol/L   O2 Saturation 60.0 %   Acid-Base Excess 4.0 (H) 0.0 - 2.0 mmol/L   Patient temperature HIDE    Sample type VENOUS    Comment NOTIFIED PHYSICIAN   Basic metabolic panel     Status: Abnormal   Collection Time: 03/21/16  4:55 AM  Result Value Ref Range   Sodium 137 135 - 145 mmol/L   Potassium 3.9 3.5 - 5.1 mmol/L   Chloride 98 (L) 101 - 111 mmol/L   CO2 29 22 - 32 mmol/L   Glucose, Bld 99 65 - 99 mg/dL   BUN 21 (H) 6 - 20 mg/dL   Creatinine, Ser 1.27 (H) 0.61 - 1.24 mg/dL   Calcium 9.1 8.9 - 10.3 mg/dL   GFR calc non Af Amer 58 (L) >60 mL/min   GFR calc Af Amer >60 >60 mL/min    Comment: (NOTE) The eGFR has been calculated using the CKD EPI equation. This calculation has not been validated in all clinical situations. eGFR's persistently <60 mL/min signify possible Chronic Kidney Disease.    Anion  gap 10 5 - 15  CBC     Status: Abnormal   Collection Time: 03/21/16  4:55 AM  Result Value Ref Range   WBC 6.1 4.0 - 10.5 K/uL   RBC 3.22 (L) 4.22 - 5.81 MIL/uL   Hemoglobin 9.9 (L) 13.0 - 17.0 g/dL   HCT 30.0 (L) 39.0 - 52.0 %   MCV 93.2 78.0 - 100.0 fL   MCH 30.7 26.0 - 34.0 pg   MCHC 33.0 30.0 - 36.0 g/dL   RDW 15.1 11.5 - 15.5 %   Platelets 261 150 - 400 K/uL  Cooxemetry Panel (carboxy, met, total hgb, O2 sat)     Status: Abnormal   Collection Time: 03/21/16  5:00 AM  Result Value Ref Range   Total hemoglobin 10.3 (L) 12.0 - 16.0 g/dL   O2 Saturation 60.3 %   Carboxyhemoglobin 1.4 0.5 - 1.5 %   Methemoglobin 1.2 0.0 - 1.5 %    No results found.  Review of Systems  Gastrointestinal: Negative.   Genitourinary: Negative.   Musculoskeletal: Positive for back pain.  Neurological: Negative.   Psychiatric/Behavioral: Negative.    Blood pressure 140/60, pulse 71, temperature 97.5 F (36.4 C), temperature source Oral, resp. rate 14, height 5' 11"  (1.803 m), weight 94.2 kg (207 lb 9.6 oz), SpO2 100 %. Physical Exam  Constitutional: He is oriented to person, place, and time. He appears well-developed.  HENT:  Head: Normocephalic.  Eyes: Pupils are equal, round, and reactive to light.  Neck: Normal range of motion.  Cardiovascular: Normal rate.   Respiratory: Effort  normal.  GI: Soft.  Musculoskeletal:  Pt lying in bed, uncomfortable, no acute distress AAO x3 SLR with LBP no radicular symptoms Sensation intact distally No calf pain or sign of DVT Able to perform SLR's, hip flexion limited by LBP R>L, otherwise no LE weakness noted  Neurological: He is oriented to person, place, and time.  Skin: Skin is warm and dry.   Lspine xrays reviewed with DDD most notable at L3-4. No instability or listhesis. Aortic calcification CT chest/abd/pelvis from 1/23 with no evidence of any infectious process in the lumbar spine  Assessment/Plan: Acute LBP  Seen by myself and Dr.  Lyla Glassing Likely exacerbation of underlying DDD vs. Myofascial etiology, no evidence of any infectious process related to his bacteremia Warm compresses low back prn Activity modifications to avoid exacerbations Could benefit from PT as an inpatient for his back Expect to be self-limited If ongoing follow up as outpt with one of our back specialists at [facility]   [place], [place]. 03/21/2016, 2:43 PM

## 2016-03-21 NOTE — Progress Notes (Signed)
Pt resting in bed and serosanguineous fluid started oozing from a scab over his ICD. Gauze and tape applied over site.  Dr. Ilsa IhaSnyder from Infectious disease and Lisabeth DevoidMeng, [place] from cardiology aware. Will cont to monitor pt.

## 2016-03-21 NOTE — Progress Notes (Signed)
Pt's SBP has been running in the 80's this afternoon and eveining, Cardiology PA made aware and new orders were received to hold po lasix for SBP less than 85. Pt has also complained of chronic back pain this afternoon and evening. Medication was given and multiple heat packs were applied with no relief. Md on call aware, New order received for a lidocaine patch. Will cont to monitor pt.

## 2016-03-21 NOTE — Progress Notes (Addendum)
CARDIAC REHAB PHASE I   PRE:  Rate/Rhythm: paced 71  BP:  Supine:   Sitting: 101/62  Standing:    SaO2: 100%RA  MODE:  Ambulation: 150 ft   POST:  Rate/Rhythm: paced 80  BP:  Supine:   Sitting: 114/72  Standing:    SaO2: 99%RA 1016-1044 Pt needed assistance to get OOB due to back pain. Walked 150 ft on RA with rolling walker with slow steady gait. To recliner after walk. Encouraged pt to weigh daily. Pt still c/o back pain more with getting up and down than when walking.   Luetta Nuttingharlene Jatavia Keltner, RN BSN  03/21/2016 10:41 AM

## 2016-03-21 NOTE — Progress Notes (Signed)
Pre-op Cardiac Surgery  Carotid Findings:  Bilateral: No significant (1-39%) ICA stenosis. Antegrade vertebral flow.     Lower extremity venous duplex: No evidence of DVT or baker's cyst.   ABI Lower  Extremity Right Left  BRACHIAL N/A 107  Anterior Tibial 120,Tri 129,Tri  Posterior Tibial 116,Tri 122,Tri  Ankle/Brachial Indices 1.12 1.21   Findings:  ABI within normal limits.    Farrel DemarkJill Eunice, RDMS, RVT 03/21/2016

## 2016-03-21 NOTE — Progress Notes (Addendum)
Patient ID: [name], male   DOB: [birth year], 61 y.o.   MRN: [id number]     Advanced Heart Failure Rounding Note  Primary Cardiologist: Dr Jens Som Primary HF: Dr. Shirlee Latch   Subjective:    Admitted from ED 03/17/16 with recurrent low output HF with NYHA IIIb-IV symptoms.   Initial Coox 30%, however by time stamps this was PRIOR to PICC placement. So likely inaccurate/peripheral draw.   He was on milrinone initially with diuresis, now off.  RHC 1/26 looked good.   He has coag negative Staph in blood.  There has been concern about his ICD pocket.  No fever and WBCs normal. CT abdomen/pelvis did not show evidence for discitis or acute MSK changes. TEE on 1/26 with no evidence of vegetation.   Feels ok except for severe low back pain. Can's sit up. No sciatic symptoms. No fevers overnight. Breathing fine. Weight down a pound. CVP 7    RHC Procedural Findings (1/26, off milrinone): Hemodynamics (mmHg) RA mean 1 RV 36/8 PA 36/11, mean 21 PCWP mean 7 Oxygen saturations: PA 62% AO 96% Cardiac Output (Fick) 6.58  Cardiac Index (Fick) 3.06 Cardiac Output (Thermo) 6.62 Cardiac Index (Thermo) 3.08  Objective:   Weight Range: 94.2 kg (207 lb 9.6 oz) Body mass index is 28.95 kg/m.   Vital Signs:   Temp:  [97.4 F (36.3 C)-98.4 F (36.9 C)] 97.5 F (36.4 C) (01/27 0744) Pulse Rate:  [49-120] 73 (01/27 0744) Resp:  [0-27] 14 (01/27 0744) BP: (85-114)/(50-73) 111/65 (01/27 0744) SpO2:  [78 %-100 %] 100 % (01/27 0744) Weight:  [94.2 kg (207 lb 9.6 oz)-94.6 kg (208 lb 9.6 oz)] 94.2 kg (207 lb 9.6 oz) (01/27 0409) Last BM Date: 03/16/16  Weight change: Filed Weights   03/20/16 0441 03/20/16 1105 03/21/16 0409  Weight: 94.6 kg (208 lb 9.6 oz) 94.6 kg (208 lb 9.6 oz) 94.2 kg (207 lb 9.6 oz)    Intake/Output:   Intake/Output Summary (Last 24 hours) at 03/21/16 0954 Last data filed at 03/21/16 0902  Gross per 24 hour  Intake            529.2 ml  Output              600 ml    Net            -70.8 ml     Physical Exam: General: Well appearing. Lying flat in bed. NAD.    HEENT: Normal.  Neck: supple. JVP 7 cm cm. Carotids 2+ bilat; no bruits. No thyromegaly or nodule noted.  Cor: PMI nondisplaced. Regular. No M/G/R Lungs: CTAB, normal effort Abdomen: soft, NT, ND, no HSM. No bruits or masses. +BS  Extremities: no cyanosis, clubbing, rash. No edema. Normal Neuro: alert & oriented x 3, cranial nerves grossly intact. moves all 4 extremities w/o difficulty. Affect pleasant.  Telemetry: Reviewed, A sensed V paced.   Labs: CBC  Recent Labs  03/19/16 0614 03/21/16 0455  WBC 5.1 6.1  HGB 9.4* 9.9*  HCT 28.0* 30.0*  MCV 93.3 93.2  PLT 266 261   Basic Metabolic Panel  Recent Labs  03/20/16 0503 03/21/16 0455  NA 135 137  K 4.4 3.9  CL 97* 98*  CO2 29 29  GLUCOSE 93 99  BUN 18 21*  CREATININE 1.28* 1.27*  CALCIUM 9.2 9.1   Liver Function Tests No results for input(s): AST, ALT, ALKPHOS, BILITOT, PROT, ALBUMIN in the last 72 hours. No results for input(s): LIPASE, AMYLASE in the  last 72 hours. Cardiac Enzymes No results for input(s): CKTOTAL, CKMB, CKMBINDEX, TROPONINI in the last 72 hours.  BNP: BNP (last 3 results)  Recent Labs  01/13/16 1008 01/27/16 1155 03/17/16 0311  BNP 256.0* 595.7* 894.5*    ProBNP (last 3 results) No results for input(s): PROBNP in the last 8760 hours.   D-Dimer No results for input(s): DDIMER in the last 72 hours. Hemoglobin A1C No results for input(s): HGBA1C in the last 72 hours. Fasting Lipid Panel No results for input(s): CHOL, HDL, LDLCALC, TRIG, CHOLHDL, LDLDIRECT in the last 72 hours. Thyroid Function Tests  Recent Labs  03/19/16 0614  TSH 5.519*  T3FREE 3.1    Other results:     Imaging/Studies:  No results found.    Medications:     Scheduled Medications: . amiodarone  200 mg Oral Daily  . aspirin EC  81 mg Oral Daily  . atorvastatin  40 mg Oral q1800  . bisoprolol   2.5 mg Oral Daily  . cefTRIAXone (ROCEPHIN)  IV  2 g Intravenous Q24H  . clopidogrel  75 mg Oral Daily  . digoxin  0.125 mg Oral QODAY  . famotidine  20 mg Oral BID  . furosemide  40 mg Oral BID  . heparin  5,000 Units Subcutaneous Q8H  . losartan  25 mg Oral Daily  . magnesium oxide  400 mg Oral Daily  . mexiletine  200 mg Oral Q12H  . nicotine  21 mg Transdermal Daily  . PARoxetine  10 mg Oral Daily  . sodium chloride flush  3 mL Intravenous Q12H  . sodium chloride flush  3 mL Intravenous Q12H  . sodium chloride flush  3 mL Intravenous Q12H  . sodium chloride flush  3 mL Intravenous Q12H  . spironolactone  25 mg Oral QHS    Infusions: . sodium chloride      PRN Medications: sodium chloride, sodium chloride, sodium chloride, acetaminophen, albuterol, ALPRAZolam, cyclobenzaprine, hydrOXYzine, nitroGLYCERIN, ondansetron (ZOFRAN) IV, ondansetron (ZOFRAN) IV, oxyCODONE, oxyCODONE-acetaminophen, sodium chloride flush, sodium chloride flush, sodium chloride flush, sodium chloride flush, sodium chloride flush, traMADol   Assessment/Plan   1. Acute on chronic systolic CHF: Echo 11/2015 LVEF 15%, ischemic cardiomyopathy.  Initially on milrinone and diuresed, now off milrinone and back on po Lasix.  See RHC above => filling pressures optimized and CO preserved off milrinone.  I wonder if his initial malaise/fatigue may have been due to indolent infection with Coag negative Staph? Long-term, I am still concerned about him.  We have begun LVAD workup.  TEE 1/26; LVEF 15% mod-severe MR - Co-ox today 60%. He can stay off milrinone at this point.   - Continue po lasix 40 mg BID.  - Home dose bisoprolol 2.5 mg daily restated 1/26    - Continue spiro 25 mg daily - Continue losartan 25 mg daily.  SBP last night 85. No room this am to uptitrate or switch to [employer]. - Continue digoxin 0.125 mg daily. Level 0.2 on admit.  - On going work up for LVAD, I think this may be in the cards for him  eventually.  He may end up being a transplant candidate down the road, just stopped smoking. VAD coordinator and Dr. Laneta Simmers have seen. HFSW to plan family meeting today.   - He is ready for d/c from HF perspective with close outpatient f/u however back pain limiting factor. Will consult ortho.  2. CAD: Recent LHC in 12/17 with stable coronary disease  - Continue statin,  ASA, Plavix.   3. Ascending aortic aneurysm - 4.1 cm 8/17. No evidence of dissection on CTA 03/17/16.  4. VT storm in 12/17: Continue amiodarone and Mexiletine. EP has seen 03/18/16. No plan to re-activate LV lead at this time. There is a strong chance it could trigger VT again. - No VT on tele.    5. Low back pain:  Suspect pain in musculoskeletal. Says he can't sit up today. However did walk with PT.  No discitis/abscess on CT abdomen/pelvis.  - Continue flexeril and tramadol prn.  - Will ask ortho to see 6. 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  LV lead is off due to possible pro-arrhythmia.  He does have some RV pacing.  As above, discussed with EP and plan to keep LV lead off.  7. ID: Blood cultures (done for back pain, ?discitis given concerns about ICD pocket) returned 2/2 coag negative Staph.  There are ongoing concerns regarding ICD pocket.  ID has seen.  - Continue ceftriaxone IV per ID. Will ask if 2-4 weeks. Blood cultures re-drawn 1/25. NGTD.  - TEE 1/26 no vegetation  - I spoke with Dr. Graciela HusbandsKlein who agrees ICD should come out particularly if we are planning VAD. Could go home in IV ceftriaxone and come back for device extraction as needed. Will need LifeVest.   Arvilla MeresBensimhon, Kenneth Cuaresma 03/21/2016 9:54 AM   Addendum: Patient now sitting up in chair and was able to walk with PT. Says he just "has a catch in his back."  Will put heating pad for comfort. Unable to get MRI due to ICD. Discussed with Ortho team.  Cal Gindlesperger,MD 10:39 AM

## 2016-03-22 LAB — BASIC METABOLIC PANEL
Anion gap: 9 (ref 5–15)
BUN: 23 mg/dL — AB (ref 6–20)
CO2: 26 mmol/L (ref 22–32)
CREATININE: 1.2 mg/dL (ref 0.61–1.24)
Calcium: 9.1 mg/dL (ref 8.9–10.3)
Chloride: 101 mmol/L (ref 101–111)
Glucose, Bld: 111 mg/dL — ABNORMAL HIGH (ref 65–99)
POTASSIUM: 4.2 mmol/L (ref 3.5–5.1)
SODIUM: 136 mmol/L (ref 135–145)

## 2016-03-22 LAB — COOXEMETRY PANEL
Carboxyhemoglobin: 1.9 % — ABNORMAL HIGH (ref 0.5–1.5)
METHEMOGLOBIN: 0.7 % (ref 0.0–1.5)
O2 Saturation: 64.9 %
TOTAL HEMOGLOBIN: 10.3 g/dL — AB (ref 12.0–16.0)

## 2016-03-22 MED ORDER — DOCUSATE SODIUM 100 MG PO CAPS
100.0000 mg | ORAL_CAPSULE | Freq: Two times a day (BID) | ORAL | Status: DC
  Administered 2016-03-22 – 2016-03-31 (×18): 100 mg via ORAL
  Filled 2016-03-22 (×18): qty 1

## 2016-03-22 MED ORDER — METOLAZONE 5 MG PO TABS
2.5000 mg | ORAL_TABLET | Freq: Once | ORAL | Status: AC
  Administered 2016-03-22: 2.5 mg via ORAL
  Filled 2016-03-22: qty 1

## 2016-03-22 NOTE — Procedures (Signed)
CVP setup changed without complications.  

## 2016-03-22 NOTE — Progress Notes (Addendum)
Patient ID: [name], male   DOB: 1955/04/28, 61 y.o.   MRN: [id number]     Advanced Heart Failure Rounding Note  Primary Cardiologist: Dr Jens Som Primary HF: Dr. Shirlee Latch   Subjective:    Admitted from ED 03/17/16 with recurrent low output HF with NYHA IIIb-IV symptoms.   Initial Coox 30%, however by time stamps this was PRIOR to PICC placement. So likely inaccurate/peripheral draw.   He was on milrinone initially with diuresis, now off.  RHC 1/26 looked good.   He has coag negative Staph in blood.  There has been concern about his ICD pocket.  No fever and WBCs normal. CT abdomen/pelvis did not show evidence for discitis or acute MSK changes. TEE on 1/26 with no evidence of vegetation.   Back still sore. Breathing ok. But doesn't feel well.  Weight down another pound. F/u bcx remain negative. ICD pocket draining serous fluid and device is exposed   RHC Procedural Findings (1/26, off milrinone): Hemodynamics (mmHg) RA mean 1 RV 36/8 PA 36/11, mean 21 PCWP mean 7 Oxygen saturations: PA 62% AO 96% Cardiac Output (Fick) 6.58  Cardiac Index (Fick) 3.06 Cardiac Output (Thermo) 6.62 Cardiac Index (Thermo) 3.08  Objective:   Weight Range: 93.7 kg (206 lb 8 oz) Body mass index is 28.8 kg/m.   Vital Signs:   Temp:  [97.3 F (36.3 C)-98.8 F (37.1 C)] 97.3 F (36.3 C) (01/28 0759) Pulse Rate:  [55-75] 69 (01/28 0759) Resp:  [11-22] 22 (01/28 0759) BP: (78-118)/(45-100) 102/63 (01/28 0759) SpO2:  [93 %-98 %] 97 % (01/28 0759) Weight:  [93.7 kg (206 lb 8 oz)] 93.7 kg (206 lb 8 oz) (01/28 0438) Last BM Date: 03/22/16  Weight change: Filed Weights   03/20/16 1105 03/21/16 0409 03/22/16 0438  Weight: 94.6 kg (208 lb 9.6 oz) 94.2 kg (207 lb 9.6 oz) 93.7 kg (206 lb 8 oz)    Intake/Output:   Intake/Output Summary (Last 24 hours) at 03/22/16 1011 Last data filed at 03/22/16 0900  Gross per 24 hour  Intake              720 ml  Output             1501 ml  Net              -781 ml     Physical Exam: CVP 10-11 General: lying in bed. NAD.    HEENT: Normal.  Neck: supple. JVP 10 cm. Carotids 2+ bilat; no bruits. No thyromegaly or nodule noted.  Cor: PMI nondisplaced. Regular. No M/G/R   ICD pocket with device exposed and draining clear fluid through medial side of wound Lungs: CTAB, normal effort Abdomen: soft, NT, ND, no HSM. No bruits or masses. +BS  Extremities: no cyanosis, clubbing, rash. No edema. Normal Neuro: alert & oriented x 3, cranial nerves grossly intact. moves all 4 extremities w/o difficulty. Affect pleasant.  Telemetry: Reviewed, A sensed V paced.   Labs: CBC  Recent Labs  03/21/16 0455  WBC 6.1  HGB 9.9*  HCT 30.0*  MCV 93.2  PLT 261   Basic Metabolic Panel  Recent Labs  03/21/16 0455 03/22/16 0420  NA 137 136  K 3.9 4.2  CL 98* 101  CO2 29 26  GLUCOSE 99 111*  BUN 21* 23*  CREATININE 1.27* 1.20  CALCIUM 9.1 9.1   Liver Function Tests No results for input(s): AST, ALT, ALKPHOS, BILITOT, PROT, ALBUMIN in the last 72 hours. No results for input(s):  LIPASE, AMYLASE in the last 72 hours. Cardiac Enzymes No results for input(s): CKTOTAL, CKMB, CKMBINDEX, TROPONINI in the last 72 hours.  BNP: BNP (last 3 results)  Recent Labs  01/13/16 1008 01/27/16 1155 03/17/16 0311  BNP 256.0* 595.7* 894.5*    ProBNP (last 3 results) No results for input(s): PROBNP in the last 8760 hours.   D-Dimer No results for input(s): DDIMER in the last 72 hours. Hemoglobin A1C No results for input(s): HGBA1C in the last 72 hours. Fasting Lipid Panel No results for input(s): CHOL, HDL, LDLCALC, TRIG, CHOLHDL, LDLDIRECT in the last 72 hours. Thyroid Function Tests No results for input(s): TSH, T4TOTAL, T3FREE, THYROIDAB in the last 72 hours.  Invalid input(s): FREET3  Other results:     Imaging/Studies:  No results found.    Medications:     Scheduled Medications: . amiodarone  200 mg Oral Daily  . aspirin EC  81  mg Oral Daily  . atorvastatin  40 mg Oral q1800  . bisoprolol  2.5 mg Oral Daily  . cefTRIAXone (ROCEPHIN)  IV  2 g Intravenous Q24H  . clopidogrel  75 mg Oral Daily  . digoxin  0.125 mg Oral QODAY  . docusate sodium  100 mg Oral BID  . famotidine  20 mg Oral BID  . furosemide  40 mg Oral BID  . heparin  5,000 Units Subcutaneous Q8H  . lidocaine  1 patch Transdermal Q24H  . losartan  25 mg Oral Daily  . magnesium oxide  400 mg Oral Daily  . mexiletine  200 mg Oral Q12H  . nicotine  21 mg Transdermal Daily  . PARoxetine  10 mg Oral Daily  . sodium chloride flush  3 mL Intravenous Q12H  . sodium chloride flush  3 mL Intravenous Q12H  . sodium chloride flush  3 mL Intravenous Q12H  . sodium chloride flush  3 mL Intravenous Q12H  . spironolactone  25 mg Oral QHS    Infusions: . sodium chloride      PRN Medications: sodium chloride, sodium chloride, sodium chloride, acetaminophen, albuterol, ALPRAZolam, cyclobenzaprine, hydrOXYzine, nitroGLYCERIN, ondansetron (ZOFRAN) IV, ondansetron (ZOFRAN) IV, oxyCODONE, oxyCODONE-acetaminophen, sodium chloride flush, sodium chloride flush, sodium chloride flush, sodium chloride flush, sodium chloride flush, traMADol   Assessment/Plan   1. Acute on chronic systolic CHF: Echo 11/2015 LVEF 15%, ischemic cardiomyopathy.  Initially on milrinone and diuresed, now off milrinone and back on po Lasix.  See RHC above => filling pressures optimized and CO preserved off milrinone.  I wonder if his initial malaise/fatigue may have been due to indolent infection with Coag negative Staph? Long-term, I am still concerned about him.  We have begun LVAD workup.  TEE 1/26; LVEF 15% mod-severe MR - Feels worse today but co-ox ok at 65%. He can stay off milrinone at this point.   - Continue po lasix 40 mg BID. Will give one dose metolazone today with climbing CVP.  - Home dose bisoprolol 2.5 mg daily restated 1/26    - Continue spiro 25 mg daily - Continue losartan  25 mg daily.  SBP last night 84. No room this am to uptitrate or switch to [employer]. - Continue digoxin 0.125 mg daily. Level 0.2 on admit.  - On going work up for LVAD, I think this may be in the cards for him eventually.  He may end up being a transplant candidate down the road, just stopped smoking. VAD coordinator and Dr. Laneta Simmers have seen. HFSW to plan family meeting today.  2. CAD: Recent LHC in 12/17 with stable coronary disease  - Continue statin, ASA, Plavix.   3. Ascending aortic aneurysm - 4.1 cm 8/17. No evidence of dissection on CTA 03/17/16.  4. VT storm in 12/17: Continue amiodarone and Mexiletine. EP has seen 03/18/16. No plan to re-activate LV lead at this time. There is a strong chance it could trigger VT again. - No VT on tele.    5. Low back pain:  Suspect pain in musculoskeletal. Says he can't sit up today. However did walk with PT.  No discitis/abscess on CT abdomen/pelvis.  - Continue flexeril and tramadol prn.  - Ortho saw yesterday. Agree with current regimen. He is unable to get MRI due to ICD 6. 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  LV lead is off due to possible pro-arrhythmia.  He does have some RV pacing.  As above, discussed with EP and plan to keep LV lead off.  7. ID: Blood cultures (done for back pain, ?discitis given concerns about ICD pocket) returned 2/2 coag negative Staph.   - ICD pocket now eroded with drainage - Continue ceftriaxone IV per ID. Will ask if 2-4 weeks. Blood cultures re-drawn 1/25. NGTD.  - TEE 1/26 no vegetation  - I spoke with Dr. Graciela HusbandsKlein who agrees ICD should come out particularly if we are planning VAD. Will plan device extraction this week. Dr. Ladona Ridgelaylor aware. - Will need LifeVest on d/c  BensimhonReuel Boom, Juniper Cobey 03/22/2016 10:11 AM

## 2016-03-23 ENCOUNTER — Encounter (HOSPITAL_COMMUNITY)
Admission: EM | Disposition: A | Discharge status: Home Health | Payer: Self-pay | Source: Home / Self Care | Attending: Cardiothoracic Surgery

## 2016-03-23 ENCOUNTER — Inpatient Hospital Stay (HOSPITAL_COMMUNITY): Payer: Medicaid Other | Admitting: Certified Registered Nurse Anesthetist

## 2016-03-23 ENCOUNTER — Encounter (HOSPITAL_COMMUNITY): Payer: Self-pay | Admitting: Certified Registered Nurse Anesthetist

## 2016-03-23 ENCOUNTER — Inpatient Hospital Stay (HOSPITAL_COMMUNITY): Payer: Medicaid Other

## 2016-03-23 DIAGNOSIS — T827XXA Infection and inflammatory reaction due to other cardiac and vascular devices, implants and grafts, initial encounter: Principal | ICD-10-CM

## 2016-03-23 DIAGNOSIS — Y712 Prosthetic and other implants, materials and accessory cardiovascular devices associated with adverse incidents: Secondary | ICD-10-CM

## 2016-03-23 DIAGNOSIS — B957 Other staphylococcus as the cause of diseases classified elsewhere: Secondary | ICD-10-CM

## 2016-03-23 HISTORY — PX: GENERATOR REMOVAL: SHX5468

## 2016-03-23 HISTORY — PX: TEE WITHOUT CARDIOVERSION: SHX5443

## 2016-03-23 HISTORY — PX: EP IMPLANTABLE DEVICE: SHX172B

## 2016-03-23 LAB — BASIC METABOLIC PANEL
Anion gap: 11 (ref 5–15)
BUN: 24 mg/dL — ABNORMAL HIGH (ref 6–20)
CO2: 28 mmol/L (ref 22–32)
Calcium: 9.4 mg/dL (ref 8.9–10.3)
Chloride: 96 mmol/L — ABNORMAL LOW (ref 101–111)
Creatinine, Ser: 1.31 mg/dL — ABNORMAL HIGH (ref 0.61–1.24)
GFR calc Af Amer: >60 mL/min (ref >60)
GFR calc non Af Amer: 56 mL/min — ABNORMAL LOW (ref >60)
Glucose, Bld: 102 mg/dL — ABNORMAL HIGH (ref 65–99)
Potassium: 3.6 mmol/L (ref 3.5–5.1)
Sodium: 135 mmol/L (ref 135–145)

## 2016-03-23 LAB — POCT I-STAT 7, (LYTES, BLD GAS, ICA,H+H)
ACID-BASE EXCESS: 5 mmol/L — AB (ref 0.0–2.0)
BICARBONATE: 29.2 mmol/L — AB (ref 20.0–28.0)
CALCIUM ION: 1.19 mmol/L (ref 1.15–1.40)
HCT: 28 % — ABNORMAL LOW (ref 39.0–52.0)
Hemoglobin: 9.5 g/dL — ABNORMAL LOW (ref 13.0–17.0)
O2 Saturation: 100 %
PCO2 ART: 38.4 mmHg (ref 32.0–48.0)
PO2 ART: 200 mmHg — AB (ref 83.0–108.0)
Potassium: 4.3 mmol/L (ref 3.5–5.1)
Sodium: 137 mmol/L (ref 135–145)
TCO2: 30 mmol/L (ref 0–100)
pH, Arterial: 7.487 — ABNORMAL HIGH (ref 7.350–7.450)

## 2016-03-23 LAB — FACTOR 5 LEIDEN

## 2016-03-23 LAB — COOXEMETRY PANEL
Carboxyhemoglobin: 1.4 % (ref 0.5–1.5)
Methemoglobin: 1.1 % (ref 0.0–1.5)
O2 Saturation: 59.3 %
Total hemoglobin: 10.7 g/dL — ABNORMAL LOW (ref 12.0–16.0)

## 2016-03-23 LAB — ECHO INTRAOPERATIVE TEE
Height: 71 in
Weight: 3280 oz

## 2016-03-23 LAB — PREPARE RBC (CROSSMATCH)

## 2016-03-23 SURGERY — REMOVAL, PULSE GENERATOR, ICD
Anesthesia: General | Site: Chest

## 2016-03-23 MED ORDER — SODIUM CHLORIDE 0.9 % IR SOLN
Status: AC
  Filled 2016-03-23: qty 2

## 2016-03-23 MED ORDER — SUFENTANIL CITRATE 50 MCG/ML IV SOLN
INTRAVENOUS | Status: AC
  Filled 2016-03-23: qty 1

## 2016-03-23 MED ORDER — LIDOCAINE HCL (PF) 1 % IJ SOLN
INTRAMUSCULAR | Status: AC
  Filled 2016-03-23: qty 30

## 2016-03-23 MED ORDER — CYCLOBENZAPRINE HCL 10 MG PO TABS
ORAL_TABLET | ORAL | Status: AC
  Filled 2016-03-23: qty 1

## 2016-03-23 MED ORDER — POTASSIUM CHLORIDE CRYS ER 20 MEQ PO TBCR
40.0000 meq | EXTENDED_RELEASE_TABLET | Freq: Once | ORAL | Status: AC
  Administered 2016-03-23: 40 meq via ORAL
  Filled 2016-03-23: qty 2

## 2016-03-23 MED ORDER — PROPOFOL 10 MG/ML IV BOLUS
INTRAVENOUS | Status: AC
  Filled 2016-03-23: qty 20

## 2016-03-23 MED ORDER — FENTANYL CITRATE (PF) 100 MCG/2ML IJ SOLN
INTRAMUSCULAR | Status: AC
  Administered 2016-03-23: 50 ug via INTRAVENOUS
  Filled 2016-03-23: qty 2

## 2016-03-23 MED ORDER — SODIUM CHLORIDE 0.9 % IJ SOLN
INTRAMUSCULAR | Status: AC
  Filled 2016-03-23: qty 10

## 2016-03-23 MED ORDER — LACTATED RINGERS IV SOLN
INTRAVENOUS | Status: DC | PRN
  Administered 2016-03-23 (×2): via INTRAVENOUS

## 2016-03-23 MED ORDER — MIDAZOLAM HCL 2 MG/2ML IJ SOLN
INTRAMUSCULAR | Status: AC
  Filled 2016-03-23: qty 2

## 2016-03-23 MED ORDER — FENTANYL CITRATE (PF) 100 MCG/2ML IJ SOLN
INTRAMUSCULAR | Status: DC | PRN
  Administered 2016-03-23: 100 ug via INTRAVENOUS
  Administered 2016-03-23 (×4): 50 ug via INTRAVENOUS

## 2016-03-23 MED ORDER — CALCIUM CHLORIDE 10 % IV SOLN
INTRAVENOUS | Status: AC
  Filled 2016-03-23: qty 10

## 2016-03-23 MED ORDER — FUROSEMIDE 40 MG PO TABS
60.0000 mg | ORAL_TABLET | Freq: Two times a day (BID) | ORAL | Status: DC
  Administered 2016-03-24: 60 mg via ORAL
  Filled 2016-03-23: qty 1

## 2016-03-23 MED ORDER — ONDANSETRON HCL 4 MG/2ML IJ SOLN: 4.0000 mg | Freq: Once | INTRAMUSCULAR | Status: DC | PRN

## 2016-03-23 MED ORDER — CEFAZOLIN SODIUM 1 G IJ SOLR
INTRAMUSCULAR | Status: DC | PRN
  Administered 2016-03-23: 2 g via INTRAMUSCULAR
  Administered 2016-03-23: 1 g via INTRAMUSCULAR

## 2016-03-23 MED ORDER — FENTANYL CITRATE (PF) 100 MCG/2ML IJ SOLN
25.0000 ug | INTRAMUSCULAR | Status: DC | PRN
  Administered 2016-03-23 (×3): 50 ug via INTRAVENOUS

## 2016-03-23 MED ORDER — ROCURONIUM BROMIDE 50 MG/5ML IV SOSY
PREFILLED_SYRINGE | INTRAVENOUS | Status: AC
  Filled 2016-03-23: qty 10

## 2016-03-23 MED ORDER — PHENYLEPHRINE HCL 10 MG/ML IJ SOLN
INTRAVENOUS | Status: DC | PRN
  Administered 2016-03-23: 25 ug/min via INTRAVENOUS

## 2016-03-23 MED ORDER — SUGAMMADEX SODIUM 200 MG/2ML IV SOLN
INTRAVENOUS | Status: DC | PRN
  Administered 2016-03-23: 200 mg via INTRAVENOUS

## 2016-03-23 MED ORDER — OXYCODONE HCL 5 MG PO TABS
5.0000 mg | ORAL_TABLET | Freq: Once | ORAL | Status: AC | PRN
  Administered 2016-03-23: 5 mg via ORAL

## 2016-03-23 MED ORDER — SUFENTANIL CITRATE 50 MCG/ML IV SOLN
INTRAVENOUS | Status: DC | PRN
  Administered 2016-03-23 (×2): 10 ug via INTRAVENOUS

## 2016-03-23 MED ORDER — CEFAZOLIN SODIUM 1 G IJ SOLR
INTRAMUSCULAR | Status: AC
  Filled 2016-03-23: qty 10

## 2016-03-23 MED ORDER — OXYCODONE HCL 5 MG PO TABS
ORAL_TABLET | ORAL | Status: AC
  Administered 2016-03-23: 5 mg via ORAL
  Filled 2016-03-23: qty 2

## 2016-03-23 MED ORDER — SODIUM CHLORIDE 0.9 % IR SOLN
80.0000 mg | Status: DC
  Filled 2016-03-23: qty 2

## 2016-03-23 MED ORDER — OXYCODONE HCL 5 MG/5ML PO SOLN: 5.0000 mg | Freq: Once | ORAL | Status: AC | PRN

## 2016-03-23 MED ORDER — LIDOCAINE 2% (20 MG/ML) 5 ML SYRINGE
INTRAMUSCULAR | Status: AC
  Filled 2016-03-23: qty 5

## 2016-03-23 MED ORDER — ETOMIDATE 2 MG/ML IV SOLN
INTRAVENOUS | Status: DC | PRN
  Administered 2016-03-23: 14 mg via INTRAVENOUS

## 2016-03-23 MED ORDER — LACTATED RINGERS IV SOLN
INTRAVENOUS | Status: DC
  Administered 2016-03-25: 23:00:00 via INTRAVENOUS

## 2016-03-23 MED ORDER — ACETAMINOPHEN 325 MG PO TABS: 325.0000 mg | ORAL_TABLET | ORAL | Status: DC | PRN

## 2016-03-23 MED ORDER — HEPARIN SODIUM (PORCINE) 5000 UNIT/ML IJ SOLN
INTRAMUSCULAR | Status: DC | PRN
  Administered 2016-03-23: 500 mL

## 2016-03-23 MED ORDER — MIDAZOLAM HCL 5 MG/5ML IJ SOLN
INTRAMUSCULAR | Status: DC | PRN
  Administered 2016-03-23: 2 mg via INTRAVENOUS

## 2016-03-23 MED ORDER — CEFAZOLIN SODIUM-DEXTROSE 2-4 GM/100ML-% IV SOLN
2.0000 g | INTRAVENOUS | Status: DC
  Filled 2016-03-23: qty 100

## 2016-03-23 MED ORDER — LIDOCAINE HCL (PF) 1 % IJ SOLN
INTRAMUSCULAR | Status: DC | PRN
  Administered 2016-03-23: 15 mL
  Administered 2016-03-23: 30 mL

## 2016-03-23 MED ORDER — SODIUM CHLORIDE 0.9 % IR SOLN
Status: DC | PRN
  Administered 2016-03-23: 500 mL

## 2016-03-23 MED ORDER — SODIUM CHLORIDE 0.9 % IV SOLN
INTRAVENOUS | Status: DC | PRN
  Administered 2016-03-23: 20:00:00 via INTRAVENOUS

## 2016-03-23 MED ORDER — ONDANSETRON HCL 4 MG/2ML IJ SOLN
INTRAMUSCULAR | Status: AC
  Filled 2016-03-23: qty 2

## 2016-03-23 MED ORDER — FENTANYL CITRATE (PF) 250 MCG/5ML IJ SOLN
INTRAMUSCULAR | Status: AC
  Filled 2016-03-23: qty 5

## 2016-03-23 MED ORDER — LIDOCAINE 2% (20 MG/ML) 5 ML SYRINGE
INTRAMUSCULAR | Status: DC | PRN
  Administered 2016-03-23: 100 mg via INTRAVENOUS

## 2016-03-23 MED ORDER — ROCURONIUM BROMIDE 10 MG/ML (PF) SYRINGE
PREFILLED_SYRINGE | INTRAVENOUS | Status: DC | PRN
  Administered 2016-03-23: 20 mg via INTRAVENOUS
  Administered 2016-03-23: 10 mg via INTRAVENOUS
  Administered 2016-03-23: 50 mg via INTRAVENOUS
  Administered 2016-03-23 (×2): 20 mg via INTRAVENOUS

## 2016-03-23 MED ORDER — CHLORHEXIDINE GLUCONATE 4 % EX LIQD
60.0000 mL | Freq: Once | CUTANEOUS | Status: AC
  Administered 2016-03-23: 4 via TOPICAL
  Filled 2016-03-23: qty 60

## 2016-03-23 MED ORDER — PHENYLEPHRINE 40 MCG/ML (10ML) SYRINGE FOR IV PUSH (FOR BLOOD PRESSURE SUPPORT)
PREFILLED_SYRINGE | INTRAVENOUS | Status: DC | PRN
  Administered 2016-03-23 (×2): 80 ug via INTRAVENOUS

## 2016-03-23 MED ORDER — LACTATED RINGERS IV SOLN
INTRAVENOUS | Status: DC | PRN
  Administered 2016-03-23: 17:00:00 via INTRAVENOUS

## 2016-03-23 MED ORDER — ONDANSETRON HCL 4 MG/2ML IJ SOLN
INTRAMUSCULAR | Status: DC | PRN
  Administered 2016-03-23: 4 mg via INTRAVENOUS

## 2016-03-23 SURGICAL SUPPLY — 76 items
BAG BANDED W/RUBBER/TAPE 36X54 (MISCELLANEOUS) ×2 IMPLANT
BAG DECANTER FOR FLEXI CONT (MISCELLANEOUS) ×4 IMPLANT
BAG EQP BAND 135X91 W/RBR TAPE (MISCELLANEOUS) ×2
BLADE 10 SAFETY STRL DISP (BLADE) ×2 IMPLANT
BLADE OSCILLATING /SAGITTAL (BLADE) IMPLANT
BLADE STERNUM SYSTEM 6 (BLADE) IMPLANT
BLADE SURG ROTATE 9660 (MISCELLANEOUS) IMPLANT
BNDG ADH 5X4 AIR PERM ELC (GAUZE/BANDAGES/DRESSINGS) ×2
BNDG COHESIVE 4X5 WHT NS (GAUZE/BANDAGES/DRESSINGS) ×4 IMPLANT
CABLE PACING FASLOC BLUE (MISCELLANEOUS) ×2 IMPLANT
CANISTER SUCTION 2500CC (MISCELLANEOUS) ×4 IMPLANT
CATH S G BIP PACING (SET/KITS/TRAYS/PACK) ×2 IMPLANT
COIL ONE TIE COMPRESSION (MISCELLANEOUS) ×2 IMPLANT
COVER DOME SNAP 22 D (MISCELLANEOUS) ×2 IMPLANT
COVER SURGICAL LIGHT HANDLE (MISCELLANEOUS) ×2 IMPLANT
COVER TABLE BACK 60X90 (DRAPES) ×4 IMPLANT
DEVICE LCKNG LEAD CARDIAC (CATHETERS) IMPLANT
DRAPE C-ARM 42X72 X-RAY (DRAPES) IMPLANT
DRAPE CARDIOVASCULAR INCISE (DRAPES) ×4
DRAPE INCISE IOBAN 66X45 STRL (DRAPES) ×2 IMPLANT
DRAPE PROXIMA HALF (DRAPES) ×8 IMPLANT
DRAPE SRG 135X102X78XABS (DRAPES) ×2 IMPLANT
DRSG COVADERM 4X6 (GAUZE/BANDAGES/DRESSINGS) ×2 IMPLANT
DRSG TEGADERM 4X4.75 (GAUZE/BANDAGES/DRESSINGS) ×8 IMPLANT
ELECT REM PT RETURN 9FT ADLT (ELECTROSURGICAL) ×4
ELECTRODE REM PT RTRN 9FT ADLT (ELECTROSURGICAL) ×4 IMPLANT
FELT TEFLON 1X6 (MISCELLANEOUS) IMPLANT
GAUZE PACKING IODOFORM 2 (PACKING) ×2 IMPLANT
GAUZE SPONGE 4X4 12PLY STRL (GAUZE/BANDAGES/DRESSINGS) ×2 IMPLANT
GAUZE SPONGE 4X4 16PLY XRAY LF (GAUZE/BANDAGES/DRESSINGS) ×4 IMPLANT
GENERATOR PACEMAKER (Pacemaker) ×2 IMPLANT
GLOVE BIOGEL PI IND STRL 7.5 (GLOVE) ×2 IMPLANT
GLOVE BIOGEL PI INDICATOR 7.5 (GLOVE) ×2
GLOVE ECLIPSE 6.0 STRL STRAW (GLOVE) ×2 IMPLANT
GLOVE ECLIPSE 8.0 STRL XLNG CF (GLOVE) ×4 IMPLANT
GOWN STRL REUS W/ TWL LRG LVL3 (GOWN DISPOSABLE) ×2 IMPLANT
GOWN STRL REUS W/ TWL XL LVL3 (GOWN DISPOSABLE) ×2 IMPLANT
GOWN STRL REUS W/TWL LRG LVL3 (GOWN DISPOSABLE) ×4
GOWN STRL REUS W/TWL XL LVL3 (GOWN DISPOSABLE) ×4
GUIDEWIRE ANGLED .035X150CM (WIRE) ×2 IMPLANT
KIT ROOM TURNOVER OR (KITS) ×4 IMPLANT
LEAD CAPSURE NOVUS 5076-58CM (Lead) ×2 IMPLANT
NDL PERC 18GX7CM (NEEDLE) IMPLANT
NEEDLE PERC 18GX7CM (NEEDLE) ×4 IMPLANT
NS IRRIG 1000ML POUR BTL (IV SOLUTION) ×2 IMPLANT
PAD ARMBOARD 7.5X6 YLW CONV (MISCELLANEOUS) ×8 IMPLANT
PAD ELECT DEFIB RADIOL ZOLL (MISCELLANEOUS) ×4 IMPLANT
REMOVAL LLD CARDIAC LEAD EZ (CATHETERS) ×4
SHEATH CLASSIC 7F (SHEATH) ×2 IMPLANT
SHEATH EVOLUTION RL 13F (SHEATH) ×4 IMPLANT
SHEATH EVOLUTION SHORTE RL 11F (SHEATH) ×2 IMPLANT
SHEATH PINNACLE 6F 10CM (SHEATH) ×4 IMPLANT
SHEATH SET CRV FEMORAL 16FR (SHEATH) ×2 IMPLANT
SHEATH TIGHTRAIL MECH (SHEATH) ×1
SHEATH TIGHTRAIL MECH 11F (SHEATH) ×1 IMPLANT
SNARE GOOSENECK 15MM (MISCELLANEOUS) ×2 IMPLANT
SNARE NDL EYE 20MM W/SHTH (CATHETERS) IMPLANT
SNARE NEEDLE EYE 20MM W/SHTH (CATHETERS) ×4 IMPLANT
SPONGE GAUZE 4X4 12PLY STER LF (GAUZE/BANDAGES/DRESSINGS) ×6 IMPLANT
SPONGE LAP 18X18 X RAY DECT (DISPOSABLE) ×4 IMPLANT
SUT PROLENE 2 0 CT2 30 (SUTURE) IMPLANT
SUT PROLENE 2 0 SH DA (SUTURE) ×2 IMPLANT
SUT SILK  1 MH (SUTURE) ×4
SUT SILK 1 MH (SUTURE) IMPLANT
SUT VIC AB 2-0 CT2 18 VCP726D (SUTURE) IMPLANT
SUT VIC AB 3-0 X1 27 (SUTURE) ×2 IMPLANT
TAPE CLOTH SURG 4X10 WHT LF (GAUZE/BANDAGES/DRESSINGS) ×2 IMPLANT
TAPE CLOTH SURG 6X10 WHT LF (GAUZE/BANDAGES/DRESSINGS) ×2 IMPLANT
TOWEL OR 17X24 6PK STRL BLUE (TOWEL DISPOSABLE) ×8 IMPLANT
TOWEL OR 17X26 10 PK STRL BLUE (TOWEL DISPOSABLE) ×4 IMPLANT
TRAY FOLEY CATH 16FRSI W/METER (SET/KITS/TRAYS/PACK) ×4 IMPLANT
TUBE CONNECTING 12'X1/4 (SUCTIONS)
TUBE CONNECTING 12X1/4 (SUCTIONS) IMPLANT
WIRE MAILMAN 182CM (WIRE) ×2 IMPLANT
WIRE SPARTACORE .014X190CM (WIRE) ×2 IMPLANT
YANKAUER SUCT BULB TIP NO VENT (SUCTIONS) IMPLANT

## 2016-03-23 NOTE — Progress Notes (Signed)
SUBJECTIVE: The patient is stable today. No chest pain or shortness of breath at rest.   CURRENT MEDICATIONS: . amiodarone  200 mg Oral Daily  . aspirin EC  81 mg Oral Daily  . atorvastatin  40 mg Oral q1800  . bisoprolol  2.5 mg Oral Daily  .  ceFAZolin (ANCEF) IV  2 g Intravenous To Cath  . cefTRIAXone (ROCEPHIN)  IV  2 g Intravenous Q24H  . chlorhexidine  60 mL Topical Once  . chlorhexidine  60 mL Topical Once  . clopidogrel  75 mg Oral Daily  . digoxin  0.125 mg Oral QODAY  . docusate sodium  100 mg Oral BID  . famotidine  20 mg Oral BID  . furosemide  60 mg Oral BID  . gentamicin irrigation  80 mg Irrigation To Cath  . heparin  5,000 Units Subcutaneous Q8H  . lidocaine  1 patch Transdermal Q24H  . losartan  25 mg Oral Daily  . magnesium oxide  400 mg Oral Daily  . mexiletine  200 mg Oral Q12H  . nicotine  21 mg Transdermal Daily  . PARoxetine  10 mg Oral Daily  . potassium chloride  40 mEq Oral Once  . spironolactone  25 mg Oral QHS     OBJECTIVE: Physical Exam: Vitals:   03/23/16 0012 03/23/16 0357 03/23/16 0400 03/23/16 0855  BP: 110/64  102/60 114/62  Pulse: 68  70 72  Resp: 15  17 (!) 25  Temp: 97.6 F (36.4 C) 97.7 F (36.5 C)  97.7 F (36.5 C)  TempSrc: Oral Oral  Oral  SpO2: 93%  96% 97%  Weight:  205 lb (93 kg)    Height:        Intake/Output Summary (Last 24 hours) at 03/23/16 1054 Last data filed at 03/23/16 1005  Gross per 24 hour  Intake             1400 ml  Output             2875 ml  Net            -1475 ml    Telemetry reveals sinus rhythm with ventricular pacing   GEN- The patient is chronically ill appearing, alert and oriented x 3 today.   Head- normocephalic, atraumatic Eyes-  Sclera clear, conjunctiva pink Ears- hearing intact Oropharynx- clear Neck- supple  Lungs- Clear to ausculation bilaterally, normal work of breathing Heart- Regular rate and rhythm  GI- soft, NT, ND, + BS Extremities- no clubbing, cyanosis, or  edema Skin- no rash or lesion, ICD exposed through left upper chest incision Psych- euthymic mood, full affect Neuro- strength and sensation are intact  LABS: Basic Metabolic Panel:  Recent Labs  16/11/9599/28/18 0420 03/23/16 0440  NA 136 135  K 4.2 3.6  CL 101 96*  CO2 26 28  GLUCOSE 111* 102*  BUN 23* 24*  CREATININE 1.20 1.31*  CALCIUM 9.1 9.4   CBC:  Recent Labs  03/21/16 0455  WBC 6.1  HGB 9.9*  HCT 30.0*  MCV 93.2  PLT 261    RADIOLOGY: Dg Chest 2 View Result Date: 03/17/2016 CLINICAL DATA:  Chest and lower back pain. History of 3 myocardial infarctions with stents. EXAM: CHEST  2 VIEW COMPARISON:  01/29/2016 CXR FINDINGS: Left-sided AICD device with coronary sinus, right atrial and right ventricular leads present. Cardiomegaly is identified with coronary arterial stenting. No aortic aneurysm. Chronic mild interstitial prominence without pneumonic consolidation, effusion or pneumothorax. Mild apical  pleuroparenchymal thickening and scarring. No acute osseous abnormality. IMPRESSION: Cardiomegaly without acute pulmonary disease. Electronically Signed   By: Tollie Eth M.D.   On: 03/17/2016 03:53   ASSESSMENT AND PLAN:  Active Problems:   Acute on chronic systolic congestive heart failure (HCC)   Bacteremia   1.  Coag negative staph blood cultures with eroding device The patient has had blood cultures positive for coag negative staph.  He also has had erosion of his device through the pocket and his device is now exposed.  He will require device system extraction. Risks, benefits reviewed with the patient by Dr Ladona Ridgel who wishes to proceed. He previously has required RV pacing which prompted CRT upgrade in the fall of 2017.  Would prefer to avoid temp-pacer wire if possible, but may be required. Will evaluate during case  2.  Chronic systolic heart failure Management per AHF team VAD workup ongoing  3.  VT No recent recurrence Will need LifeVest at discharge - ordered  today  4.  CAD  No recent ischemic symptoms Continue medical therapy   Plan device system extraction later today in the OR.   Gypsy Balsam, NP 03/23/2016 11:19 AM  EP Attending  Patient seen and examined. Agree with above. Will plan ICD system extraction and insertion of a temp perm PM later today as our schedule allows.  Leonia Reeves.D.

## 2016-03-23 NOTE — H&P (View-Only) (Signed)
Occupational Therapy Treatment Patient Details Name: [name] MRN: [id number] DOB: 03/22/1955 Today's Date: 03/23/2016    History of present illness Pt is a 61 y/o male admitted secondary to worsening SOB, acute CHF exacerbation. 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 s/p cardioversion 03/20/16.    OT comments  Educated pt on use of AE for LB ADL due to back pain. Pt states he has had SI joint problems in the past and this "seems like the same pain". Pt states he needs to be able to care for himself after DC. Will follow up after procedure and modify plan of care as appropriate.   Follow Up Recommendations  No OT follow up;Supervision - Intermittent (pending hospitalization. will further assess)    Equipment Recommendations  Other (comment) (TBA)    Recommendations for Other Services      Precautions / Restrictions Precautions Precautions: Other (comment) Precaution Comments: monitor O2 (having pacemaker removed 1/29. life vest?) Restrictions Weight Bearing Restrictions: No       Mobility Bed Mobility Overal bed mobility: Needs Assistance Bed Mobility: Sidelying to Sit   Sidelying to sit: Supervision       General bed mobility comments: Pt able to get to EOB with pain. Educated on log rolling to reduce pain with mobility  Transfers Overall transfer level: Needs assistance   Transfers: Sit to/from Stand Sit to Stand: Supervision              Balance                                   ADL                                       Functional mobility during ADLs: Supervision/safety General ADL Comments: Demonstrated use of AE for LB ADL. PT complains of pain more consistent with SI pain. Pt will need to be mod I at DC.       Vision                     Perception     Praxis      Cognition   Behavior During Therapy: WFL for  tasks assessed/performed Overall Cognitive Status: Within Functional Limits for tasks assessed                       Extremity/Trunk Assessment               Exercises     Shoulder Instructions       General Comments      Pertinent Vitals/ Pain       Pain Assessment: Faces Faces Pain Scale: Hurts even more Pain Location: lower back Pain Descriptors / Indicators: Discomfort Pain Intervention(s): Limited activity within patient's tolerance  Home Living                                          Prior Functioning/Environment              Frequency  Min 2X/week        Progress Toward Goals  OT Goals(current goals  can now be found in the care plan section)  Progress towards OT goals: Progressing toward goals  Acute Rehab OT Goals Patient Stated Goal: to ride his [address] [employer], to walk without a walker OT Goal Formulation: With patient Time For Goal Achievement: 04/02/16 Potential to Achieve Goals: Good ADL Goals Pt Will Perform Lower Body Bathing: with modified independence;sit to/from stand Pt Will Perform Lower Body Dressing: with modified independence;sit to/from stand Pt Will Transfer to Toilet: with modified independence;ambulating;regular height toilet Additional ADL Goal #1: Pt will independently verbally recall 3 energy conservation strategies.  Plan Discharge plan remains appropriate    Co-evaluation                 End of Session     Activity Tolerance Patient tolerated treatment well   Patient Left in bed;with call bell/phone within reach   Nurse Communication Mobility status        Time: 1610-9604 OT Time Calculation (min): 20 min  Charges: OT General Charges $OT Visit: 1 Procedure OT Treatments $Self Care/Home Management : 8-22 mins  [name],[name] 03/23/2016, 11:29 AM   Luisa Dago, OT/L  [phone] 03/23/2016
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
## 2016-03-23 NOTE — Progress Notes (Signed)
Occupational Therapy Treatment Patient Details Name: [name] LuriaLewis R Ciszek MRN: [id number] DOB: 03/22/1955 Today's Date: 03/23/2016    History of present illness Pt is a 61 y/o male admitted secondary to worsening SOB, acute CHF exacerbation. 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 s/p cardioversion 03/20/16.    OT comments  Educated pt on use of AE for LB ADL due to back pain. Pt states he has had SI joint problems in the past and this "seems like the same pain". Pt states he needs to be able to care for himself after DC. Will follow up after procedure and modify plan of care as appropriate.   Follow Up Recommendations  No OT follow up;Supervision - Intermittent (pending hospitalization. will further assess)    Equipment Recommendations  Other (comment) (TBA)    Recommendations for Other Services      Precautions / Restrictions Precautions Precautions: Other (comment) Precaution Comments: monitor O2 (having pacemaker removed 1/29. life vest?) Restrictions Weight Bearing Restrictions: No       Mobility Bed Mobility Overal bed mobility: Needs Assistance Bed Mobility: Sidelying to Sit   Sidelying to sit: Supervision       General bed mobility comments: Pt able to get to EOB with pain. Educated on log rolling to reduce pain with mobility  Transfers Overall transfer level: Needs assistance   Transfers: Sit to/from Stand Sit to Stand: Supervision              Balance                                   ADL                                       Functional mobility during ADLs: Supervision/safety General ADL Comments: Demonstrated use of AE for LB ADL. PT complains of pain more consistent with SI pain. Pt will need to be mod I at DC.       Vision                     Perception     Praxis      Cognition   Behavior During Therapy: WFL for  tasks assessed/performed Overall Cognitive Status: Within Functional Limits for tasks assessed                       Extremity/Trunk Assessment               Exercises     Shoulder Instructions       General Comments      Pertinent Vitals/ Pain       Pain Assessment: Faces Faces Pain Scale: Hurts even more Pain Location: lower back Pain Descriptors / Indicators: Discomfort Pain Intervention(s): Limited activity within patient's tolerance  Home Living                                          Prior Functioning/Environment              Frequency  Min 2X/week        Progress Toward Goals  OT Goals(current goals  can now be found in the care plan section)  Progress towards OT goals: Progressing toward goals  Acute Rehab OT Goals Patient Stated Goal: to ride his [address] [employer]acker, to walk without a walker OT Goal Formulation: With patient Time For Goal Achievement: 04/02/16 Potential to Achieve Goals: Good ADL Goals Pt Will Perform Lower Body Bathing: with modified independence;sit to/from stand Pt Will Perform Lower Body Dressing: with modified independence;sit to/from stand Pt Will Transfer to Toilet: with modified independence;ambulating;regular height toilet Additional ADL Goal #1: Pt will independently verbally recall 3 energy conservation strategies.  Plan Discharge plan remains appropriate    Co-evaluation                 End of Session     Activity Tolerance Patient tolerated treatment well   Patient Left in bed;with call bell/phone within reach   Nurse Communication Mobility status        Time: 1610-9604 OT Time Calculation (min): 20 min  Charges: OT General Charges $OT Visit: 1 Procedure OT Treatments $Self Care/Home Management : 8-22 mins  Coulson Wehner,HILLARY 03/23/2016, 11:29 AM   Luisa Dago, OT/L  [phone] 03/23/2016

## 2016-03-23 NOTE — Anesthesia Postprocedure Evaluation (Addendum)
Anesthesia Post Note  Patient: Hermenia BersLewis R Bornemann  Procedure(s) Performed: Procedure(s) (LRB): DEVICE REMOVAL (N/A) TRANSESOPHAGEAL ECHOCARDIOGRAM (TEE) (N/A) Temporary placement of pacemaker lead and generator (N/A)  Patient location during evaluation: PACU Anesthesia Type: General Level of consciousness: awake, awake and alert and oriented Pain management: pain level controlled Vital Signs Assessment: post-procedure vital signs reviewed and stable Respiratory status: spontaneous breathing, nonlabored ventilation and respiratory function stable Cardiovascular status: blood pressure returned to baseline Postop Assessment: no headache Anesthetic complications: no       Last Vitals:  Vitals:   03/23/16 1518 03/23/16 2200  BP: 109/62 (P) 123/71  Pulse: 67 (P) 90  Resp: 20   Temp: 36.7 C (P) 36.7 C    Last Pain:  Vitals:   03/23/16 1518  TempSrc: Oral  PainSc:                  Timi Reeser COKER

## 2016-03-23 NOTE — Anesthesia Procedure Notes (Signed)
Procedure Name: Intubation Date/Time: 03/23/2016 5:23 PM Performed by: Annabelle HarmanSMITH, [name] Mott A Pre-anesthesia Checklist: Patient identified, Emergency Drugs available, Suction available and Patient being monitored Patient Re-evaluated:Patient Re-evaluated prior to inductionOxygen Delivery Method: Circle system utilized Preoxygenation: Pre-oxygenation with 100% oxygen Intubation Type: IV induction Ventilation: Mask ventilation without difficulty and Oral airway inserted - appropriate to patient size Laryngoscope Size: Hyacinth MeekerMiller and 2 Grade View: Grade I Tube type: Oral Tube size: 7.5 mm Number of attempts: 1 Airway Equipment and Method: Stylet Placement Confirmation: ETT inserted through vocal cords under direct vision,  positive ETCO2 and breath sounds checked- equal and bilateral Secured at: 24 cm Tube secured with: Tape Dental Injury: Teeth and Oropharynx as per pre-operative assessment

## 2016-03-23 NOTE — Progress Notes (Signed)
[facility]   Reason for visit: Follow up on ICD pocket infection  Interval History: plan for ICD removal today, afebrile, repeat blood cultures ngtd; patient anxious about his condition, no n/v/d.  Was seen by orthopedics for his back pain.   Physical Exam: Constitutional:  Vitals:   03/23/16 0400 03/23/16 0855  BP: 102/60 114/62  Pulse: 70 72  Resp: 17 (!) 25  Temp:  97.7 F (36.5 C)   patient appears in NAD Eyes: anicteric HENT: no thrush Respiratory: Normal respiratory effort; CTA B Cardiovascular: RRR GI: soft, nt, nd  Review of Systems: Constitutional: negative for fevers, chills and anorexia Respiratory: negative for cough Integument/breast: negative for rash Behavioral/Psych: positive for anxiety, negative for sleep disturbance  Lab Results  Component Value Date   WBC 6.1 03/21/2016   HGB 9.9 (L) 03/21/2016   HCT 30.0 (L) 03/21/2016   MCV 93.2 03/21/2016   PLT 261 03/21/2016    Lab Results  Component Value Date   CREATININE 1.31 (H) 03/23/2016   BUN 24 (H) 03/23/2016   NA 135 03/23/2016   K 3.6 03/23/2016   CL 96 (L) 03/23/2016   CO2 28 03/23/2016    Lab Results  Component Value Date   ALT 17 03/17/2016   AST 17 03/17/2016   ALKPHOS 47 03/17/2016     Microbiology: Recent Results (from the past 240 hour(s))  MRSA PCR Screening     Status: None   Collection Time: 03/17/16  5:15 PM  Result Value Ref Range Status   MRSA by PCR NEGATIVE NEGATIVE Final    Comment:        The GeneXpert MRSA Assay (FDA approved for NASAL specimens only), is one component of a comprehensive MRSA colonization surveillance program. It is not intended to diagnose MRSA infection nor to guide or monitor treatment for MRSA infections.   Culture, blood (Routine X 2) w Reflex to ID Panel     Status: Abnormal   Collection Time: 03/18/16 10:00 AM  Result Value Ref Range Status   Specimen Description BLOOD LEFT ANTECUBITAL  Final   Special  Requests IN PEDIATRIC BOTTLE  2CC  Final   Culture  Setup Time   Final    GRAM POSITIVE COCCI IN CLUSTERS IN PEDIATRIC BOTTLE CRITICAL VALUE NOTED.  VALUE IS CONSISTENT WITH PREVIOUSLY REPORTED AND CALLED VALUE.    Culture (A)  Final    STAPHYLOCOCCUS EPIDERMIDIS SUSCEPTIBILITIES PERFORMED ON PREVIOUS CULTURE WITHIN THE LAST 5 DAYS.    Report Status 03/21/2016 FINAL  Final  Culture, blood (Routine X 2) w Reflex to ID Panel     Status: Abnormal   Collection Time: 03/18/16 10:06 AM  Result Value Ref Range Status   Specimen Description BLOOD LEFT HAND  Final   Special Requests IN PEDIATRIC BOTTLE  3CC  Final   Culture  Setup Time   Final    GRAM POSITIVE COCCI IN CLUSTERS IN PEDIATRIC BOTTLE CRITICAL RESULT CALLED TO, READ BACK BY AND VERIFIED WITH: PHARMD A JOHNSTON [id number] MLM    Culture STAPHYLOCOCCUS EPIDERMIDIS (A)  Final   Report Status 03/21/2016 FINAL  Final   Organism ID, Bacteria STAPHYLOCOCCUS EPIDERMIDIS  Final      Susceptibility   Staphylococcus epidermidis - MIC*    CIPROFLOXACIN <=0.5 SENSITIVE Sensitive     ERYTHROMYCIN <=0.25 SENSITIVE Sensitive     GENTAMICIN <=0.5 SENSITIVE Sensitive     OXACILLIN <=0.25 SENSITIVE Sensitive     TETRACYCLINE <=1 SENSITIVE  Sensitive     VANCOMYCIN 1 SENSITIVE Sensitive     TRIMETH/SULFA <=10 SENSITIVE Sensitive     CLINDAMYCIN <=0.25 SENSITIVE Sensitive     RIFAMPIN <=0.5 SENSITIVE Sensitive     Inducible Clindamycin NEGATIVE Sensitive     * STAPHYLOCOCCUS EPIDERMIDIS  Blood Culture ID Panel (Reflexed)     Status: Abnormal   Collection Time: 03/18/16 10:06 AM  Result Value Ref Range Status   Enterococcus species NOT DETECTED NOT DETECTED Final   Listeria monocytogenes NOT DETECTED NOT DETECTED Final   Staphylococcus species DETECTED (A) NOT DETECTED Final    Comment: CRITICAL RESULT CALLED TO, READ BACK BY AND VERIFIED WITH: PHARMD A JOHNSTON [id number] MLM    Staphylococcus aureus NOT DETECTED NOT DETECTED Final     Methicillin resistance NOT DETECTED NOT DETECTED Final   Streptococcus species NOT DETECTED NOT DETECTED Final   Streptococcus agalactiae NOT DETECTED NOT DETECTED Final   Streptococcus pneumoniae NOT DETECTED NOT DETECTED Final   Streptococcus pyogenes NOT DETECTED NOT DETECTED Final   Acinetobacter baumannii NOT DETECTED NOT DETECTED Final   Enterobacteriaceae species NOT DETECTED NOT DETECTED Final   Enterobacter cloacae complex NOT DETECTED NOT DETECTED Final   Escherichia coli NOT DETECTED NOT DETECTED Final   Klebsiella oxytoca NOT DETECTED NOT DETECTED Final   Klebsiella pneumoniae NOT DETECTED NOT DETECTED Final   Proteus species NOT DETECTED NOT DETECTED Final   Serratia marcescens NOT DETECTED NOT DETECTED Final   Haemophilus influenzae NOT DETECTED NOT DETECTED Final   Neisseria meningitidis NOT DETECTED NOT DETECTED Final   Pseudomonas aeruginosa NOT DETECTED NOT DETECTED Final   Candida albicans NOT DETECTED NOT DETECTED Final   Candida glabrata NOT DETECTED NOT DETECTED Final   Candida krusei NOT DETECTED NOT DETECTED Final   Candida parapsilosis NOT DETECTED NOT DETECTED Final   Candida tropicalis NOT DETECTED NOT DETECTED Final  Culture, blood (routine x 2)     Status: None (Preliminary result)   Collection Time: 03/19/16 11:34 PM  Result Value Ref Range Status   Specimen Description BLOOD LEFT ARM  Final   Special Requests BOTTLES DRAWN AEROBIC AND ANAEROBIC 10ML  Final   Culture NO GROWTH 2 DAYS  Final   Report Status PENDING  Incomplete  Culture, blood (routine x 2)     Status: None (Preliminary result)   Collection Time: 03/19/16 11:43 PM  Result Value Ref Range Status   Specimen Description BLOOD LEFT HAND  Final   Special Requests AEROBIC BOTTLE ONLY 10ML  Final   Culture NO GROWTH 2 DAYS  Final   Report Status PENDING  Incomplete    Impression/Plan:  1. ICD pocket infection - for extraction today.  On ceftriaxone; repeat blood cultures ngtd. Will repeat  again tomorrow after extraction.  4 weeks of IV ceftriaxone from today after extraction through February 25th Reimplantation per EP, but at least after 72 hours if blood cultures remain negative  2.  Bacteremia - Staph epi.  Antibiotics as in #1. Will also be ideal to change picc line after today with bacteremia.    3.  Back pain - recommendations per orthopedics. Still with pain.

## 2016-03-23 NOTE — Transfer of Care (Signed)
Immediate Anesthesia Transfer of Care Note  Patient: [name]  Procedure(s) Performed: Procedure(s) with comments: DEVICE REMOVAL (N/A) - PVT to back up TRANSESOPHAGEAL ECHOCARDIOGRAM (TEE) (N/A) Temporary placement of pacemaker lead and generator (N/A)  Patient Location: PACU  Anesthesia Type:General  Level of Consciousness: awake, alert  and oriented  Airway & Oxygen Therapy: Patient Spontanous Breathing and Patient connected to face mask oxygen  Post-op Assessment: Report given to RN and Post -op Vital signs reviewed and stable  Post vital signs: Reviewed and stable  Last Vitals:  Vitals:   03/23/16 0855 03/23/16 1518  BP: 114/62 109/62  Pulse: 72 67  Resp: (!) 25 20  Temp: 36.5 C 36.7 C    Last Pain:  Vitals:   03/23/16 1518  TempSrc: Oral  PainSc:       Patients Stated Pain Goal: 0 (03/23/16 [id number])  Complications: No apparent anesthesia complications

## 2016-03-23 NOTE — Progress Notes (Signed)
CARDIAC REHAB PHASE I   PRE:  Rate/Rhythm: 60 paced  BP:  Sitting: 105/72        SaO2: 99 RA  MODE:  Ambulation: 150 ft   POST:  Rate/Rhythm: 74 paced  BP:  Sitting: 109/60         SaO2: 98 RA  Pt ambulated 150 ft on RA, rolling walker, assist x1, slow, steady gait, tolerated well with no complaints other than back pain. VSS. Pt to edge of bed per pt request after walk, call bell within reach. Will follow.   [phone] Joylene GrapesEmily C Mishawn Hemann, RN, BSN 03/23/2016 2:42 PM

## 2016-03-23 NOTE — Anesthesia Preprocedure Evaluation (Signed)
Anesthesia Evaluation  Patient identified by MRN, date of birth, ID band Patient awake    Reviewed: Allergy & Precautions, NPO status , Patient's Chart, lab work & pertinent test results  Airway Mallampati: I  TM Distance: >3 FB Neck ROM: Full    Dental   Pulmonary former smoker,    Pulmonary exam normal        Cardiovascular hypertension, Pt. on medications + CAD, + Past MI and + Cardiac Stents  Normal cardiovascular exam+ Cardiac Defibrillator      Neuro/Psych    GI/Hepatic   Endo/Other    Renal/GU      Musculoskeletal   Abdominal   Peds  Hematology   Anesthesia Other Findings   Reproductive/Obstetrics                             Anesthesia Physical Anesthesia Plan  ASA: III  Anesthesia Plan: General   Post-op Pain Management:    Induction: Intravenous  Airway Management Planned: Oral ETT  Additional Equipment: Arterial line  Intra-op Plan:   Post-operative Plan: Extubation in OR  Informed Consent: I have reviewed the patients History and Physical, chart, labs and discussed the procedure including the risks, benefits and alternatives for the proposed anesthesia with the patient or authorized representative who has indicated his/her understanding and acceptance.     Plan Discussed with: CRNA and Surgeon  Anesthesia Plan Comments:         Anesthesia Quick Evaluation

## 2016-03-23 NOTE — Interval H&P Note (Signed)
History and Physical Interval Note: EP Attending: Patient seen and examined. Agree with above. I have discussed the indications risks/benefits/goals/expectations of ICD system extraction and he wishes to proceed.  03/23/2016 3:54 PM  Crista LuriaLewis R Horiuchi  has presented today for surgery, with the diagnosis of Device issue  The various methods of treatment have been discussed with the patient and family. After consideration of risks, benefits and other options for treatment, the patient has consented to  Procedure(s) with comments: DEVICE REMOVAL (N/A) - PVT to back up TRANSESOPHAGEAL ECHOCARDIOGRAM (TEE) (N/A) as a surgical intervention .  The patient's history has been reviewed, patient examined, no change in status, stable for surgery.  I have reviewed the patient's chart and labs.  Questions were answered to the patient's satisfaction.     Lewayne BuntingGregg Emmilynn Marut

## 2016-03-23 NOTE — Progress Notes (Signed)
Physical Therapy Treatment Patient Details Name: [name] MRN: [id number] DOB: 05/18/1955 Today's Date: 03/23/2016    History of Present Illness Pt is a 61 y/o male admitted secondary to worsening SOB, acute CHF exacerbation. 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 s/p cardioversion 03/20/16.     PT Comments    Pt reports that his back feels better walking than sitting or lying down. Attempted sitting position after ambulation but pt requested return to bed. With palpation, pt confirms pain primarily at Lt upper/mid gluteal region (glute medius/piriformis regions). Pt with decreased active motion when attempting seated active piriformis stretch position. Recommended supine lateral hip stretch as tolerated. Bilateral grade 2 oscillations in sidelying through pelvis into rotation with pt reporting decreased pain. PT to continue to follow, to be going to get pacemaker removed later today.   Follow Up Recommendations  No PT follow up     Equipment Recommendations  None recommended by PT    Recommendations for Other Services       Precautions / Restrictions Precautions Precautions: Other (comment) Precaution Comments: monitor O2 (having pacemaker removed 1/29. life vest?) Restrictions Weight Bearing Restrictions: No    Mobility  Bed Mobility Overal bed mobility: Needs Assistance Bed Mobility: Sidelying to Sit   Sidelying to sit: Supervision       General bed mobility comments: Encouraging logroll for bed mobility  Transfers Overall transfer level: Needs assistance Equipment used: None Transfers: Sit to/from Stand Sit to Stand: Supervision         General transfer comment: cues for hand placement. Used rw for initial transfer from bed and none from chiar.   Ambulation/Gait Ambulation/Gait assistance: Supervision Ambulation Distance (Feet): 160 Feet Assistive device: Rolling  walker (2 wheeled) Gait Pattern/deviations: Step-through pattern;Decreased step length - right;Decreased step length - left;Trunk flexed Gait velocity: decreased   General Gait Details: cues for posture, SpO2 88-96% on RA.    Stairs            Wheelchair Mobility    Modified Rankin (Stroke Patients Only)       Balance Overall balance assessment: Needs assistance Sitting-balance support: Feet supported;No upper extremity supported Sitting balance-Leahy Scale: Good     Standing balance support: During functional activity;No upper extremity supported Standing balance-Leahy Scale: Fair Standing balance comment: using rw for ambulation, able to stand static without UE support.                     Cognition Arousal/Alertness: Awake/alert Behavior During Therapy: WFL for tasks assessed/performed Overall Cognitive Status: Within Functional Limits for tasks assessed                      Exercises      General Comments        Pertinent Vitals/Pain Pain Assessment: 0-10 Pain Score: 7  Faces Pain Scale: Hurts even more Pain Location: low back and lt gluteal region Pain Descriptors / Indicators: Aching;Sore Pain Intervention(s): Limited activity within patient's tolerance;Monitored during session    Home Living                      Prior Function            PT Goals (current goals can now be found in the care plan section) Acute Rehab PT Goals Patient Stated Goal: get procedures done with.  PT Goal Formulation: With  patient Time For Goal Achievement: 04/01/16 Potential to Achieve Goals: Good Progress towards PT goals: Progressing toward goals    Frequency    Min 3X/week      PT Plan Current plan remains appropriate    Co-evaluation             End of Session Equipment Utilized During Treatment: Gait belt Activity Tolerance: Patient limited by fatigue Patient left: in bed;with call bell/phone within reach     Time:  1008-1031 PT Time Calculation (min) (ACUTE ONLY): 23 min  Charges:  $Gait Training: 8-22 mins $Therapeutic Activity: 8-22 mins                    G Codes:      Christiane Ha, PT, CSCS Pager [phone] Office [phone]  03/23/2016, 11:41 AM

## 2016-03-23 NOTE — Progress Notes (Signed)
[id number] Talked with PT. Will let them evaluate pt with walking since he is still with extreme back pain. Will continue to follow. Luetta NuttingCharlene Oluwasemilore Bahl RN BSN 03/23/2016 9:35 AM

## 2016-03-23 NOTE — Progress Notes (Signed)
  Echocardiogram Echocardiogram Transesophageal has been performed.  Delcie RochENNINGTON, Ingrid Shifrin 03/23/2016, 6:50 PM

## 2016-03-23 NOTE — Progress Notes (Signed)
Patient ID: [name], male   DOB: March 11, 1955, 61 y.o.   MRN: [id number]     Advanced Heart Failure Rounding Note  Primary Cardiologist: Dr Jens Som Primary HF: Dr. Shirlee Latch   Subjective:    Admitted from ED 03/17/16 with recurrent low output HF with NYHA IIIb-IV symptoms.   Initial Coox 30%, however by time stamps this was PRIOR to PICC placement. So likely inaccurate/peripheral draw.   He was on milrinone initially with diuresis, now off.  RHC 1/26 looked good.   BCx 03/18/16 2/2 coag negative staph. ICD pocked noted to have serous drainage and exposed ICD. CT abdomen/pelvis did not show evidence for discitis or acute MSK changes Afebrile. WBCs WNL currently.   ICD will need to be removed.  TEE on 1/26 with no evidence of vegetation.   Feeling better today. States he was SOB yesterday am but improved with diuresis. No SOB this am. Denies fevers or chills.   Out 2.2 L and down 1 lb with metolazone yesterday.   RHC Procedural Findings (1/26, off milrinone): Hemodynamics (mmHg) RA mean 1 RV 36/8 PA 36/11, mean 21 PCWP mean 7 Oxygen saturations: PA 62% AO 96% Cardiac Output (Fick) 6.58  Cardiac Index (Fick) 3.06 Cardiac Output (Thermo) 6.62 Cardiac Index (Thermo) 3.08  Objective:   Weight Range: 205 lb (93 kg) Body mass index is 28.59 kg/m.   Vital Signs:   Temp:  [97.5 F (36.4 C)-98.7 F (37.1 C)] 97.7 F (36.5 C) (01/29 0855) Pulse Rate:  [63-72] 72 (01/29 0855) Resp:  [15-25] 25 (01/29 0855) BP: (91-114)/(44-68) 114/62 (01/29 0855) SpO2:  [93 %-98 %] 97 % (01/29 0855) Weight:  [205 lb (93 kg)] 205 lb (93 kg) (01/29 0357) Last BM Date: 03/22/16  Weight change: Filed Weights   03/21/16 0409 03/22/16 0438 03/23/16 0357  Weight: 207 lb 9.6 oz (94.2 kg) 206 lb 8 oz (93.7 kg) 205 lb (93 kg)    Intake/Output:   Intake/Output Summary (Last 24 hours) at 03/23/16 0922 Last data filed at 03/23/16 0851  Gross per 24 hour  Intake             1400 ml  Output              2875 ml  Net            -1475 ml     Physical Exam: CVP 9-10 General Lying in bed, NAD.    HEENT: Normal.  Neck: supple. JVP 8 cm. Carotids 2+ bilat; no bruits. No thyromegaly or nodule noted.  Cor: PMI nondisplaced. Regular. No M/G/R   ICD pocket with device exposed and draining clear fluid through medial side of wound. Dressing currently C/D/I. Lungs: Clear, normal effort.  Abdomen: soft, NT, ND, no HSM. No bruits or masses. +BS  Extremities: no cyanosis, clubbing, rash. No peripheral edema.   Neuro: alert & oriented x 3, cranial nerves grossly intact. moves all 4 extremities w/o difficulty. Affect pleasant.  Telemetry: Reviewed, A sensed V paced.   Labs: CBC  Recent Labs  03/21/16 0455  WBC 6.1  HGB 9.9*  HCT 30.0*  MCV 93.2  PLT 261   Basic Metabolic Panel  Recent Labs  03/22/16 0420 03/23/16 0440  NA 136 135  K 4.2 3.6  CL 101 96*  CO2 26 28  GLUCOSE 111* 102*  BUN 23* 24*  CREATININE 1.20 1.31*  CALCIUM 9.1 9.4   Liver Function Tests No results for input(s): AST, ALT, ALKPHOS, BILITOT, PROT, ALBUMIN  in the last 72 hours. No results for input(s): LIPASE, AMYLASE in the last 72 hours. Cardiac Enzymes No results for input(s): CKTOTAL, CKMB, CKMBINDEX, TROPONINI in the last 72 hours.  BNP: BNP (last 3 results)  Recent Labs  01/13/16 1008 01/27/16 1155 03/17/16 0311  BNP 256.0* 595.7* 894.5*    ProBNP (last 3 results) No results for input(s): PROBNP in the last 8760 hours.   D-Dimer No results for input(s): DDIMER in the last 72 hours. Hemoglobin A1C No results for input(s): HGBA1C in the last 72 hours. Fasting Lipid Panel No results for input(s): CHOL, HDL, LDLCALC, TRIG, CHOLHDL, LDLDIRECT in the last 72 hours. Thyroid Function Tests No results for input(s): TSH, T4TOTAL, T3FREE, THYROIDAB in the last 72 hours.  Invalid input(s): FREET3  Other results:     Imaging/Studies:  No results found.    Medications:      Scheduled Medications: . amiodarone  200 mg Oral Daily  . aspirin EC  81 mg Oral Daily  . atorvastatin  40 mg Oral q1800  . bisoprolol  2.5 mg Oral Daily  . cefTRIAXone (ROCEPHIN)  IV  2 g Intravenous Q24H  . clopidogrel  75 mg Oral Daily  . digoxin  0.125 mg Oral QODAY  . docusate sodium  100 mg Oral BID  . famotidine  20 mg Oral BID  . furosemide  40 mg Oral BID  . heparin  5,000 Units Subcutaneous Q8H  . lidocaine  1 patch Transdermal Q24H  . losartan  25 mg Oral Daily  . magnesium oxide  400 mg Oral Daily  . mexiletine  200 mg Oral Q12H  . nicotine  21 mg Transdermal Daily  . PARoxetine  10 mg Oral Daily  . spironolactone  25 mg Oral QHS    Infusions:   PRN Medications: acetaminophen, albuterol, ALPRAZolam, cyclobenzaprine, hydrOXYzine, nitroGLYCERIN, ondansetron (ZOFRAN) IV, ondansetron (ZOFRAN) IV, oxyCODONE, oxyCODONE-acetaminophen, sodium chloride flush, traMADol   Assessment/Plan   1. Acute on chronic systolic CHF: Echo 11/2015 LVEF 15%, ischemic cardiomyopathy.  Initially on milrinone and diuresed, now off milrinone and back on po Lasix.  See RHC above => filling pressures optimized and CO preserved off milrinone.  I wonder if his initial malaise/fatigue may have been due to indolent infection with Coag negative Staph? Long-term, I am still concerned about him.  We have begun LVAD workup.  TEE 1/26; LVEF 15% mod-severe MR.  Coox 59.3% off milrinone.  CVP 9-10.  - Can increase Lasix to 60 mg bid.   - Home dose bisoprolol 2.5 mg daily restarted 1/26    - Continue spiro 25 mg daily - Continue losartan 25 mg daily.  - Continue digoxin 0.125 mg daily. Level 0.2 on admit.  - On going work up for LVAD. May eventually be transplant candidate. Just stopped smoking PTA. VAD coordinator and Dr. Laneta Simmers have seen. HFSW to plan family meeting. 2. CAD: Recent LHC in 12/17 with stable coronary disease  - Continue statin, ASA, Plavix.   3. Ascending aortic aneurysm: 4.1 cm 8/17.  No evidence of dissection on CTA 03/17/16.  4. VT storm in 12/17: Continue amiodarone and Mexiletine. EP has seen 03/18/16. No plan to re-activate LV lead at this time. There is a strong chance it could trigger VT again. - No VT on tele.    - Will need Lifevest after ICD removed.  5. Low back pain:  Suspect pain in musculoskeletal. Says he can't sit up today. However did walk with PT.  No discitis/abscess  on CT abdomen/pelvis.  - Continue flexeril and tramadol prn.  - Ortho saw, suspect MSK in origin. Agree with current regimen. He is unable to get MRI due to ICD 6. 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  LV lead is off due to possible pro-arrhythmia.  He does have some RV pacing.  He will need device out due to exposure/infection.  Will observe in lab for need for temporary-permanent device and will watch for a few days in hospital.  7. ID: Blood cultures (done for back pain, ?discitis given concerns about ICD pocket) returned 2/2 coag negative Staph.  ICD pocket now eroded with drainage - Continue ceftriaxone IV per ID. Will ask if 2-4 weeks. Blood cultures re-drawn 1/25. NGTD.  - TEE 1/26 no vegetation  - ICD extraction today.    [name] FreerMichael Andrew Tillery, PA-C  03/23/2016 9:22 AM   Advanced Heart Failure Team Pager [phone] (M-F; 7a - 4p)  Please contact [facility] for night-coverage after hours (4p -7a ) and weekends on [email or web address]  Patient seen with PA, agree with the above note.  Mild volume overload but breathing better.  Co-ox ok.  - Increase Lasix to 60 mg bid.    ICD pocket erosion, will need to be removed (plan for today). Will need Lifevest afterwards.  May need temporary-permanent pacemaker given history of high grade heart block with RV pacing.  Will observe in lab after device removal and in hospital for a few days afterwards to determine need for temp-perm.    Eventually, suspect he will need LVAD.  Will continue to follow for resolution of infectious issues.    [name] 03/23/2016 10:25 AM

## 2016-03-23 NOTE — Progress Notes (Signed)
CT Surgery  I provided 2 hours of cardiac surgical backup for lead extraction on Maleki Kazmierczak.  P Donata ClayVan Trigt MD

## 2016-03-23 NOTE — Op Note (Signed)
EP procedure note  Procedure performed: Extraction of a biventricular ICD system and insertion of a temporary permanent transvenous pacemaker  Preoperative diagnosis: ICD system infection with bacteremia and a draining pocket  Postoperative diagnosis: same as preop diagnosis  Description of the procedure: After informed consent was obtained, the patient was taken to the operating room in the fasting state. Invasive arterial hemodynamic monitoring was applied to the anesthesia service. A transesophageal echo probe was placed by the anesthesia service along with general endotracheal anesthesia. A 6 French sheath was placed in both right and left femoral veins. Initial attempts to puncture the left jugular vein were unsuccessful and a Glidewire was advanced by way of the right femoral vein into the right jugular vein. The right jugular vein wasn't successfully punctured and a Medtronic active fixation pacing lead was inserted under fluoroscopic guidance into the right ventricle. At this point attention was turned to the left sided infected defibrillation system. The patient had an atrial lead that had been placed 3 months previously. After the sutures were freed up and the sewing sleeve retracted, a stylette was inserted into the lead body and the helix of the lead was retracted and the lead was removed with gentle traction. Next attention was turned to the left ventricular lead. An 014 angioplasty guidewire was inserted into the body of the lead. The silk suture was removed. The lead was removed with gentle traction. Next the 63 year old active fixation dual coil Medtronic defibrillation lead was targeted for extraction. The helix of the lead was retracted with a stylette. A Spectranetics active-fixation stylette was inserted into the body of the defibrillation lead after the lead been cut. A Cook RL 9 French short sheath was advanced was advanced over the lead beyond the clavicle into the junction of the  first rib and the subclavian vein/innominate vein. There is very severe fibrous scar tissue in this location and it appeared that the sheath insulation was beginning to bulge together. The short sheath was removed and the Spectranetics 11 French mechanical dissection sheath was advanced all the way to the portion of the innominate vein anterior to the spine. Again additional attempts to advance the lead were met by resistance and it appeared that the insulation of the lead was broaching up preventing the scar tissue from being broken. The Colbert RL 29 [place] dissection sheath was then advanced over the defibrillation lead all the way down to the proximal portion of the distal coil. Again it appeared that the inner filaments of the lead had gone through the insulation and despite multiple attempts to get by this region in the lead we were unsuccessful. At this point, attention was turned to the right femoral vein. The [facility] [address] [place] workstation was advanced under fluoroscopic guidance into the right atrium. The needles eye snare was then inserted into the 16 French sheath and advanced into the right atrium as well. Attempts to grab the defibrillation lead were quite difficult. This was due to the previously implanted active-fixation temporary permanent transvenous pacemaker lead which had been placed in the right internal jugular vein and was very close to the old defibrillation lead. In addition a PICC line from the right arm was also in the right atrium. Both the PICC line and the right ventricular pacing lead were removed. Ultimately, the lead was successfully snared and pulled back into the superior vena cava, but then slipped free. At this point a 15 mm gooseneck snare was advanced and was able to snare the freehand  of the distal portion of the defibrillation lead. The lead was pulled down into the 16 French sheath but unfortunately broke. The distal portion of the lead was in the 16 [place] sheath along with the  distal defibrillation coil. At this point I advanced an inner catheter into the lead resulting in the free end of the lead displaced into the inferior vena cava. The 36 French sheath was probe back to the junction of the right and left iliac vein. The gooseneck snare was then advanced through the 40 French sheath and recent snared the lead. The lead was fixed and the large 16 French sheath was advanced over the lead to the proximal portion of the distal helix. At this point, the sheath was removed and pressure was held. The 6 French sheath was also removed from the left femoral vein. The ICD pocket was irrigated and electrocautery was utilized to achieve hemostasis. The capsule of the pocket which was quite necrotic and purulent was dissected free. Cultures were not sent because the patient had known staph epi bacteremia. The incision was closed with multiple mattress Prolene sutures. Iodoform gauze was used to pack the wound. A bandage was placed, and the patient was returned to the recovery area. He was in stable hemodynamic condition. A TEE performed just prior to this demonstrated no pericardial effusion and no residual portions of the defibrillation lead inside the heart.  Complications: There were no immediate procedure complications  Conclusion: Successful extraction of a 63 year old dual coil active fixation defibrillation lead as well as previously implanted atrial and left ventricular pacing leads as well as removal of the ICD generator along with insertion of a active-fixation temporary permanent transvenous pacing system.  Cristopher Peru, M.D.

## 2016-03-23 NOTE — Progress Notes (Signed)
   03/23/16 1036  Clinical Encounter Type  Visited With Patient  Visit Type Initial  Referral From Patient  Consult/Referral To Chaplain  Recommendations AD  Spiritual Encounters  Spiritual Needs Literature  Stress Factors  Patient Stress Factors None identified  Advance Directives (For Healthcare)  Does Patient Have a Medical Advance Directive? No  Would patient like information on creating a medical advance directive? Yes (Inpatient - patient requests chaplain consult to create a medical advance directive)  Pt. Requested AD forms, wants to consult with daughter first.  Will contact if need further assistance from Spiritual Wellness.  Chaplain Shelma Eiben A. [name]   [phone]

## 2016-03-24 ENCOUNTER — Encounter (HOSPITAL_COMMUNITY): Payer: Self-pay | Admitting: Internal Medicine

## 2016-03-24 ENCOUNTER — Inpatient Hospital Stay (HOSPITAL_COMMUNITY): Payer: Medicaid Other

## 2016-03-24 ENCOUNTER — Inpatient Hospital Stay (HOSPITAL_COMMUNITY): Admission: RE | Admit: 2016-03-24 | Payer: Self-pay | Source: Ambulatory Visit

## 2016-03-24 DIAGNOSIS — T827XXD Infection and inflammatory reaction due to other cardiac and vascular devices, implants and grafts, subsequent encounter: Secondary | ICD-10-CM

## 2016-03-24 LAB — URINALYSIS, ROUTINE W REFLEX MICROSCOPIC
BILIRUBIN URINE: NEGATIVE
Glucose, UA: NEGATIVE mg/dL
Hgb urine dipstick: NEGATIVE
Ketones, ur: NEGATIVE mg/dL
LEUKOCYTES UA: NEGATIVE
NITRITE: NEGATIVE
PH: 5 (ref 5.0–8.0)
Protein, ur: NEGATIVE mg/dL
SPECIFIC GRAVITY, URINE: 1.016 (ref 1.005–1.030)

## 2016-03-24 LAB — CBC
HEMATOCRIT: 30.5 % — AB (ref 39.0–52.0)
HEMATOCRIT: 32.1 % — AB (ref 39.0–52.0)
HEMOGLOBIN: 10.2 g/dL — AB (ref 13.0–17.0)
Hemoglobin: 10.7 g/dL — ABNORMAL LOW (ref 13.0–17.0)
MCH: 30.8 pg (ref 26.0–34.0)
MCH: 30.7 pg (ref 26.0–34.0)
MCHC: 33.4 g/dL (ref 30.0–36.0)
MCHC: 33.3 g/dL (ref 30.0–36.0)
MCV: 92.1 fL (ref 78.0–100.0)
MCV: 92.2 fL (ref 78.0–100.0)
Platelets: 251 10*3/uL (ref 150–400)
Platelets: 255 10*3/uL (ref 150–400)
RBC: 3.31 MIL/uL — AB (ref 4.22–5.81)
RBC: 3.48 MIL/uL — ABNORMAL LOW (ref 4.22–5.81)
RDW: 15.9 % — AB (ref 11.5–15.5)
RDW: 15.7 % — AB (ref 11.5–15.5)
WBC: 9.9 10*3/uL (ref 4.0–10.5)
WBC: 11.5 10*3/uL — AB (ref 4.0–10.5)

## 2016-03-24 LAB — BASIC METABOLIC PANEL
ANION GAP: 10 (ref 5–15)
BUN: 20 mg/dL (ref 6–20)
CO2: 26 mmol/L (ref 22–32)
Calcium: 9.3 mg/dL (ref 8.9–10.3)
Chloride: 99 mmol/L — ABNORMAL LOW (ref 101–111)
Creatinine, Ser: 1.31 mg/dL — ABNORMAL HIGH (ref 0.61–1.24)
GFR, EST NON AFRICAN AMERICAN: 56 mL/min — AB (ref >60)
Glucose, Bld: 106 mg/dL — ABNORMAL HIGH (ref 65–99)
POTASSIUM: 4.6 mmol/L (ref 3.5–5.1)
SODIUM: 135 mmol/L (ref 135–145)

## 2016-03-24 LAB — CREATININE, SERUM
Creatinine, Ser: 1.22 mg/dL (ref 0.61–1.24)
GFR calc non Af Amer: >60 mL/min (ref >60)

## 2016-03-24 LAB — MRSA PCR SCREENING: MRSA by PCR: NEGATIVE

## 2016-03-24 LAB — GLUCOSE, CAPILLARY: Glucose-Capillary: 88 mg/dL (ref 65–99)

## 2016-03-24 MED ORDER — FENTANYL CITRATE (PF) 100 MCG/2ML IJ SOLN
50.0000 ug | Freq: Once | INTRAMUSCULAR | Status: AC
  Administered 2016-03-24: 50 ug via INTRAVENOUS
  Filled 2016-03-24: qty 2

## 2016-03-24 MED ORDER — NOREPINEPHRINE BITARTRATE 1 MG/ML IV SOLN
0.0000 ug/min | INTRAVENOUS | Status: DC
  Administered 2016-03-24: 2 ug/min via INTRAVENOUS
  Filled 2016-03-24: qty 4

## 2016-03-24 MED ORDER — SODIUM CHLORIDE 0.9 % IV BOLUS (SEPSIS)
250.0000 mL | Freq: Once | INTRAVENOUS | Status: AC
  Administered 2016-03-24: 250 mL via INTRAVENOUS

## 2016-03-24 MED ORDER — CEFAZOLIN SODIUM-DEXTROSE 2-4 GM/100ML-% IV SOLN
2.0000 g | Freq: Three times a day (TID) | INTRAVENOUS | Status: DC
  Administered 2016-03-25 – 2016-03-27 (×7): 2 g via INTRAVENOUS
  Filled 2016-03-24 (×10): qty 100
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
## 2016-03-24 NOTE — Progress Notes (Signed)
Heart failure clinic paged concerning BP continuing to be low despite NS bolus earlier.  Will notify MD and call back with orders.

## 2016-03-24 NOTE — Progress Notes (Signed)
RN reports pt has been orthostatic when he sits up. Will f/u tomorrow. Ethelda ChickKristan Varick Keys CES, ACSM 3:14 PM 03/24/2016

## 2016-03-24 NOTE — Progress Notes (Signed)
Paged for orthostasis  Pressures from 90s to 72/57 with standing to transfer to bed.   Hold evening lasix and hold spiro for now.  Will give 250 cc of fluid.   Received losartan this am, but will hold tomorrows dose pending BPs in am.   MD aware of plan.   Also c/o dysuria with "burning" will send UA and UCx.   Casimiro NeedleMichael [address]"Andy" [place], PA-C 03/24/2016 4:00 PM

## 2016-03-24 NOTE — Care Management Note (Signed)
Case Management Note Original Note initiated by [name] 03/23/16  Patient Details  Name: [name] MRN: [id number] Date of Birth: 12/28/1955  Subjective/Objective:  Pt presented for worsening SOB and weight gain. Initiated on IV Lasix and IV Milrinone gtt. Per MD notes plan to wean Milrinone and plan for RHC on 03-20-16. [place].                     Action/Plan: LVAD workup in process. CM will continue to monitor for home needs.   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 Awaiting Insurance Authorization at this time. ICD pocket infection - for extraction today.  CM will continue to monitor.  [name], [name] Mozingo S, RN 03/24/2016, 11:12 AM

## 2016-03-24 NOTE — Progress Notes (Signed)
Patient Name: [name] Date of Encounter: 03/24/2016     Active Problems:   Acute on chronic systolic congestive heart failure (HCC)   Bacteremia    SUBJECTIVE  S/p ICD system extraction. Stable. C/o back pain. No chest pain.   CURRENT MEDS . amiodarone  200 mg Oral Daily  . aspirin EC  81 mg Oral Daily  . atorvastatin  40 mg Oral q1800  . bisoprolol  2.5 mg Oral Daily  . cefTRIAXone (ROCEPHIN)  IV  2 g Intravenous Q24H  . clopidogrel  75 mg Oral Daily  . cyclobenzaprine      . digoxin  0.125 mg Oral QODAY  . docusate sodium  100 mg Oral BID  . famotidine  20 mg Oral BID  . furosemide  60 mg Oral BID  . heparin  5,000 Units Subcutaneous Q8H  . lidocaine  1 patch Transdermal Q24H  . losartan  25 mg Oral Daily  . magnesium oxide  400 mg Oral Daily  . mexiletine  200 mg Oral Q12H  . nicotine  21 mg Transdermal Daily  . PARoxetine  10 mg Oral Daily  . spironolactone  25 mg Oral QHS    OBJECTIVE  Vitals:   03/24/16 0400 03/24/16 0500 03/24/16 0600 03/24/16 0756  BP:  (!) 88/56 106/66   Pulse: 80 80 80   Resp: 12 11 18    Temp: 98 F (36.7 C)   97.5 F (36.4 C)  TempSrc: Oral   Oral  SpO2: 97% 95% 97%   Weight:      Height:        Intake/Output Summary (Last 24 hours) at 03/24/16 0946 Last data filed at 03/24/16 0800  Gross per 24 hour  Intake             2015 ml  Output             2255 ml  Net             -240 ml   Filed Weights   03/21/16 0409 03/22/16 0438 03/23/16 0357  Weight: 207 lb 9.6 oz (94.2 kg) 206 lb 8 oz (93.7 kg) 205 lb (93 kg)    PHYSICAL EXAM  General: Pleasant, NAD. Neuro: Alert and oriented X 3. Moves all extremities spontaneously. Psych: Normal affect. HEENT:  Normal  Neck: Supple without bruits or JVD. Indwelling right IJ PPM. Lungs:  Resp regular and unlabored, CTA. Heart: RRR no s3, s4, or murmurs. ICD pocket - with a small amount of serosanguinous drainage Abdomen: Soft, non-tender, non-distended, BS + x 4.    Extremities: No clubbing, cyanosis or edema. DP/PT/Radials 2+ and equal bilaterally.  Accessory Clinical Findings  CBC  Recent Labs  03/23/16 2330 03/24/16 0420  WBC 11.5* 9.9  HGB 10.7* 10.2*  HCT 32.1* 30.5*  MCV 92.2 92.1  PLT 251 255   Basic Metabolic Panel  Recent Labs  03/23/16 0440 03/23/16 2002 03/23/16 2330 03/24/16 0420  NA 135 137  --  135  K 3.6 4.3  --  4.6  CL 96*  --   --  99*  CO2 28  --   --  26  GLUCOSE 102*  --   --  106*  BUN 24*  --   --  20  CREATININE 1.31*  --  1.22 1.31*  CALCIUM 9.4  --   --  9.3   Liver Function Tests No results for input(s): AST, ALT, ALKPHOS, BILITOT, PROT, ALBUMIN in  the last 72 hours. No results for input(s): LIPASE, AMYLASE in the last 72 hours. Cardiac Enzymes No results for input(s): CKTOTAL, CKMB, CKMBINDEX, TROPONINI in the last 72 hours. BNP Invalid input(s): POCBNP D-Dimer No results for input(s): DDIMER in the last 72 hours. Hemoglobin A1C No results for input(s): HGBA1C in the last 72 hours. Fasting Lipid Panel No results for input(s): CHOL, HDL, LDLCALC, TRIG, CHOLHDL, LDLDIRECT in the last 72 hours. Thyroid Function Tests No results for input(s): TSH, T4TOTAL, T3FREE, THYROIDAB in the last 72 hours.  Invalid input(s): FREET3  TELE  Ventricular paced  Radiology/Studies  Dg Chest 2 View  Result Date: 03/24/2016 CLINICAL DATA:  Pacer removal EXAM: CHEST  2 VIEW COMPARISON:  7 days ago FINDINGS: Single chamber pacer from the right with lead in the expected location of the right ventricle. Left-sided pacer has been removed. Cardiopericardial size is stable. Cardiomegaly and coronary stent. Low volume chest without pneumothorax. Chest wall emphysema on the left, presumably related to dissection of generator pack. IMPRESSION: 1. Stable cardiopericardial size after left-sided pacer removal. Left chest wall gas, no pneumothorax. 2. New right-sided pacer with right ventricular lead. 3. Low volume chest.  Electronically Signed   By: Marnee Spring M.D.   On: 03/24/2016 08:39   Dg Chest 2 View  Result Date: 03/17/2016 CLINICAL DATA:  Chest and lower back pain. History of 3 myocardial infarctions with stents. EXAM: CHEST  2 VIEW COMPARISON:  01/29/2016 CXR FINDINGS: Left-sided AICD device with coronary sinus, right atrial and right ventricular leads present. Cardiomegaly is identified with coronary arterial stenting. No aortic aneurysm. Chronic mild interstitial prominence without pneumonic consolidation, effusion or pneumothorax. Mild apical pleuroparenchymal thickening and scarring. No acute osseous abnormality. IMPRESSION: Cardiomegaly without acute pulmonary disease. Electronically Signed   By: Tollie Eth M.D.   On: 03/17/2016 03:53   Dg Lumbar Spine 2-3 Views  Result Date: 03/18/2016 CLINICAL DATA:  Acute low back pain without known injury. EXAM: LUMBAR SPINE - 2-3 VIEW COMPARISON:  CT scan of December 16, 2015. FINDINGS: No fracture or spondylolisthesis is noted. Atherosclerosis of abdominal aorta is noted. Mild degenerative disc disease is noted at L3-4. Remaining disc spaces appear intact. IMPRESSION: Mild degenerative disc disease at L3-4. No acute abnormality seen in the lumbar spine. Aortic atherosclerosis. Electronically Signed   By: Lupita Raider, M.D.   On: 03/18/2016 11:36   Dg Chest Port 1 View  Result Date: 03/17/2016 CLINICAL DATA:  PICC line placement EXAM: PORTABLE CHEST 1 VIEW COMPARISON:  03/17/2016 FINDINGS: The visualized bony structures of the thorax are intact. 2054 hours. Right PICC line tip overlies the distal SVC level. The cardio pericardial silhouette is enlarged. The lungs are clear wiithout focal pneumonia, edema, pneumothorax or pleural effusion. Left pacer/AICD again noted. IMPRESSION: 1. Right PICC line tip overlies the distal SVC. 2. Cardiomegaly. Electronically Signed   By: Kennith Center M.D.   On: 03/17/2016 21:05   Ct Angio Chest/abd/pel For Dissection W And/or  Wo Contrast  Result Date: 03/17/2016 CLINICAL DATA:  Right-sided chest pain EXAM: CT ANGIOGRAPHY CHEST, ABDOMEN AND PELVIS TECHNIQUE: Multidetector CT imaging through the chest, abdomen and pelvis was performed using the standard protocol during bolus administration of intravenous contrast. Multiplanar reconstructed images and MIPs were obtained and reviewed to evaluate the vascular anatomy. CONTRAST:  100 mL Isovue 370. COMPARISON:  Chest x-ray from earlier in the same day, 12/16/2015, 10/18/2015 FINDINGS: CTA CHEST FINDINGS Cardiovascular: The thoracic aorta shows mild atherosclerotic calcifications without aneurysmal dilatation or  dissection. Pulmonary artery shows a normal branching pattern without evidence of pulmonary embolism. Coronary calcifications and prior coronary stenting is noted. A defibrillator is again noted. Cardiac structures are mildly enlarged but stable. Multiple chest wall venous collaterals are identified likely related to stenosis of the left subclavian vein from the defibrillator. Mediastinum/Nodes: Thoracic inlet is within normal limits. Stable mediastinal adenopathy is noted in the right peritracheal and subcarinal region with the largest of these in the right peritracheal region measuring 16 mm in short axis which is stable from the prior exam. Scattered small hilar lymph nodes are noted also stable from the previous study. The esophagus as visualize is within normal limits. Lungs/Pleura: Mild emphysematous changes are noted. No focal infiltrate or sizable effusion is seen. Mild peribronchial thickening is noted which is stable from the prior exam likely of a chronic nature. No acute abnormality is seen. Musculoskeletal: Degenerative changes of the thoracic spine are noted. No acute bony abnormality is seen. Review of the MIP images confirms the above findings. CTA ABDOMEN AND PELVIS FINDINGS VASCULAR Aorta: The abdominal aorta demonstrates atherosclerotic calcifications without  aneurysmal dilatation or evidence of dissection. Celiac: Mild atherosclerotic calcifications are noted at the origin although the celiac axis is widely patent. SMA: Mild atherosclerotic changes are noted at the origin although the superior mesenteric artery is patent. Renals: Single renal arteries are identified bilaterally with mild atherosclerotic calcifications. No focal hemodynamically significant stenosis is noted. IMA: Widely patent Iliacs: Mild atherosclerotic changes are seen without aneurysmal dilatation or dissection. Veins: Although not timed for venous evaluation no definitive abnormality is seen. Review of the MIP images confirms the above findings. NON-VASCULAR Hepatobiliary: Multiple hypodensities are again identified within the liver consistent with hepatic cysts. These are stable from previous exams. The gallbladder has been surgically removed. Pancreas: Unremarkable. No pancreatic ductal dilatation or surrounding inflammatory changes. Spleen: Normal in size without focal abnormality. Adrenals/Urinary Tract: The adrenal glands are within normal limits. Kidneys are well visualized bilaterally without evidence of renal mass or calcification. No obstructive changes are seen. The bladder is well distended. Stomach/Bowel: Scattered diverticular changes are noted throughout the colon. The appendix is well visualized and within normal limits. No obstructive changes are seen. No inflammatory changes are identified. Lymphatic: No significant lymphadenopathy is identified. Reproductive: Prostate is unremarkable. Other: No abdominal wall hernia or abnormality. No abdominopelvic ascites. Musculoskeletal: No acute or significant osseous findings. Review of the MIP images confirms the above findings. IMPRESSION: CTA of the chest: No evidence of aortic dissection or aneurysmal dilatation. No definitive pulmonary emboli are identified. Stable mediastinal and hilar lymph nodes likely reactive in nature. Mild changes  of bronchitis are noted as well. 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 CTA of the abdomen and pelvis. No aneurysmal dilatation or dissection is noted. Scattered chronic changes similar to that noted on previous exams. Electronically Signed   By: Alcide Clever M.D.   On: 03/17/2016 08:33    ASSESSMENT AND PLAN  1. ICD system infection - s/p extraction. He is stable hemodynamically. His pocket has a small amount of drainage. I removed packing material this morning. 2. 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 3. Difficulty with urination - he had a bladder scan with 600 cc of urine. He may require a foley catheter 4. Chronic systolic heart failure - his device is out. He will require a new device. Timing in part dependent on ID service rec's.  Leonia Reeves.D.  03/24/2016 9:46 AMPatient ID: [name] LuriaLewis R [name], male   DOB: 08/19/1955, 61 y.o.   MRN: [id number]

## 2016-03-24 NOTE — Progress Notes (Signed)
[facility] during this admission to support transition home on IV ABX as ordered by ID and hospital team.  [facility]   If patient discharges after hours, please call [phone].   [name] 03/24/2016, 9:28 PM

## 2016-03-24 NOTE — Progress Notes (Signed)
Maxine GlennMichael Tillery, PA-C notified of drop in BP upon sitting on side of bed and continuation of BP to be low after lying patient back down.  Order received for 250cc NS bolus.

## 2016-03-24 NOTE — Progress Notes (Signed)
[name] Demarkhonda Barrett, NP called back with orders for Levophed after discussing with attending physician for low BP.  Currently BP 104/51.  Order for Levophed if patient drops BP again tonight if needed.  Will continue to monitor.  Night shift RN made aware during report.

## 2016-03-24 NOTE — Progress Notes (Signed)
[facility]   Reason for visit: Follow up on pocket infection  Interval History: s/p extraction of biventricular ICD with insertion of temporary pacemaker; no fever, no associated n/v/d.  CXR independently reviewed and no opacity  Physical Exam: Constitutional:  Vitals:   03/24/16 0900 03/24/16 1000  BP:    Pulse: 80 80  Resp: 17 18  Temp:     patient appears in NAD Eyes: anicteric HENT: no thrush Respiratory: Normal respiratory effort; CTA B Cardiovascular: RRR GI: soft, nt, nd  Review of Systems: Constitutional: negative for fevers and chills Respiratory: negative for cough Cardiovascular: negative for chest pressure/discomfort Gastrointestinal: negative for diarrhea  Lab Results  Component Value Date   WBC 9.9 03/24/2016   HGB 10.2 (L) 03/24/2016   HCT 30.5 (L) 03/24/2016   MCV 92.1 03/24/2016   PLT 255 03/24/2016    Lab Results  Component Value Date   CREATININE 1.31 (H) 03/24/2016   BUN 20 03/24/2016   NA 135 03/24/2016   K 4.6 03/24/2016   CL 99 (L) 03/24/2016   CO2 26 03/24/2016    Lab Results  Component Value Date   ALT 17 03/17/2016   AST 17 03/17/2016   ALKPHOS 47 03/17/2016     Microbiology: Recent Results (from the past 240 hour(s))  MRSA PCR Screening     Status: None   Collection Time: 03/17/16  5:15 PM  Result Value Ref Range Status   MRSA by PCR NEGATIVE NEGATIVE Final    Comment:        The GeneXpert MRSA Assay (FDA approved for NASAL specimens only), is one component of a comprehensive MRSA colonization surveillance program. It is not intended to diagnose MRSA infection nor to guide or monitor treatment for MRSA infections.   Culture, blood (Routine X 2) w Reflex to ID Panel     Status: Abnormal   Collection Time: 03/18/16 10:00 AM  Result Value Ref Range Status   Specimen Description BLOOD LEFT ANTECUBITAL  Final   Special Requests IN PEDIATRIC BOTTLE  2CC  Final   Culture  Setup Time   Final      GRAM POSITIVE COCCI IN CLUSTERS IN PEDIATRIC BOTTLE CRITICAL VALUE NOTED.  VALUE IS CONSISTENT WITH PREVIOUSLY REPORTED AND CALLED VALUE.    Culture (A)  Final    STAPHYLOCOCCUS EPIDERMIDIS SUSCEPTIBILITIES PERFORMED ON PREVIOUS CULTURE WITHIN THE LAST 5 DAYS.    Report Status 03/21/2016 FINAL  Final  Culture, blood (Routine X 2) w Reflex to ID Panel     Status: Abnormal   Collection Time: 03/18/16 10:06 AM  Result Value Ref Range Status   Specimen Description BLOOD LEFT HAND  Final   Special Requests IN PEDIATRIC BOTTLE  3CC  Final   Culture  Setup Time   Final    GRAM POSITIVE COCCI IN CLUSTERS IN PEDIATRIC BOTTLE CRITICAL RESULT CALLED TO, READ BACK BY AND VERIFIED WITH: PHARMD A JOHNSTON [id number] MLM    Culture STAPHYLOCOCCUS EPIDERMIDIS (A)  Final   Report Status 03/21/2016 FINAL  Final   Organism ID, Bacteria STAPHYLOCOCCUS EPIDERMIDIS  Final      Susceptibility   Staphylococcus epidermidis - MIC*    CIPROFLOXACIN <=0.5 SENSITIVE Sensitive     ERYTHROMYCIN <=0.25 SENSITIVE Sensitive     GENTAMICIN <=0.5 SENSITIVE Sensitive     OXACILLIN <=0.25 SENSITIVE Sensitive     TETRACYCLINE <=1 SENSITIVE Sensitive     VANCOMYCIN 1 SENSITIVE Sensitive     TRIMETH/SULFA <=  10 SENSITIVE Sensitive     CLINDAMYCIN <=0.25 SENSITIVE Sensitive     RIFAMPIN <=0.5 SENSITIVE Sensitive     Inducible Clindamycin NEGATIVE Sensitive     * STAPHYLOCOCCUS EPIDERMIDIS  Blood Culture ID Panel (Reflexed)     Status: Abnormal   Collection Time: 03/18/16 10:06 AM  Result Value Ref Range Status   Enterococcus species NOT DETECTED NOT DETECTED Final   Listeria monocytogenes NOT DETECTED NOT DETECTED Final   Staphylococcus species DETECTED (A) NOT DETECTED Final    Comment: CRITICAL RESULT CALLED TO, READ BACK BY AND VERIFIED WITH: PHARMD A JOHNSTON [id number] MLM    Staphylococcus aureus NOT DETECTED NOT DETECTED Final   Methicillin resistance NOT DETECTED NOT DETECTED Final    Streptococcus species NOT DETECTED NOT DETECTED Final   Streptococcus agalactiae NOT DETECTED NOT DETECTED Final   Streptococcus pneumoniae NOT DETECTED NOT DETECTED Final   Streptococcus pyogenes NOT DETECTED NOT DETECTED Final   Acinetobacter baumannii NOT DETECTED NOT DETECTED Final   Enterobacteriaceae species NOT DETECTED NOT DETECTED Final   Enterobacter cloacae complex NOT DETECTED NOT DETECTED Final   Escherichia coli NOT DETECTED NOT DETECTED Final   Klebsiella oxytoca NOT DETECTED NOT DETECTED Final   Klebsiella pneumoniae NOT DETECTED NOT DETECTED Final   Proteus species NOT DETECTED NOT DETECTED Final   Serratia marcescens NOT DETECTED NOT DETECTED Final   Haemophilus influenzae NOT DETECTED NOT DETECTED Final   Neisseria meningitidis NOT DETECTED NOT DETECTED Final   Pseudomonas aeruginosa NOT DETECTED NOT DETECTED Final   Candida albicans NOT DETECTED NOT DETECTED Final   Candida glabrata NOT DETECTED NOT DETECTED Final   Candida krusei NOT DETECTED NOT DETECTED Final   Candida parapsilosis NOT DETECTED NOT DETECTED Final   Candida tropicalis NOT DETECTED NOT DETECTED Final  Culture, blood (routine x 2)     Status: None (Preliminary result)   Collection Time: 03/19/16 11:34 PM  Result Value Ref Range Status   Specimen Description BLOOD LEFT ARM  Final   Special Requests BOTTLES DRAWN AEROBIC AND ANAEROBIC  Final   Culture NO GROWTH 3 DAYS  Final   Report Status PENDING  Incomplete  Culture, blood (routine x 2)     Status: None (Preliminary result)   Collection Time: 03/19/16 11:43 PM  Result Value Ref Range Status   Specimen Description BLOOD LEFT HAND  Final   Special Requests AEROBIC BOTTLE ONLY  Final   Culture NO GROWTH 3 DAYS  Final   Report Status PENDING  Incomplete  MRSA PCR Screening     Status: None   Collection Time: 03/23/16 11:27 PM  Result Value Ref Range Status   MRSA by PCR NEGATIVE NEGATIVE Final    Comment:        The GeneXpert MRSA  Assay (FDA approved for NASAL specimens only), is one component of a comprehensive MRSA colonization surveillance program. It is not intended to diagnose MRSA infection nor to guide or monitor treatment for MRSA infections.     Impression/Plan:  1. 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  S/p removal.  Repeat blood cultures sent.  If blood cultures from today remain negative at 72 hours, ok to replace device (on Friday 2/2 or later)  2.  Bacteremia - Staph epi.  Will be ideal to change picc line now that device is out.

## 2016-03-24 NOTE — Progress Notes (Signed)
Patient ID: [name], male   DOB: August 05, 1955, 61 y.o.   MRN: [id number]     Advanced Heart Failure Rounding Note  Primary Cardiologist: Dr Jens Som Primary HF: Dr. Shirlee Latch   Subjective:    Admitted from ED 03/17/16 with recurrent low output HF with NYHA IIIb-IV symptoms.   He was on milrinone initially with diuresis, now off.  RHC 1/26 looked good.   BCx 03/18/16 2/2 coag negative staph. ICD pocked noted to have serous drainage and exposed ICD. CT abdomen/pelvis did not show evidence for discitis or acute MSK changes Afebrile. WBCs WNL currently.   S/p ICD extraction 03/23/16 with placement of Temporary permanent pacemaker with previous pacer dependency due to CHB.   TEE on 1/26 with no evidence of vegetation.   Feeling OK this morning. Sore. Primary complaint is back pain.  Usually much better after an "adjustment" chiropractor.  [place] Spaniel he has had the same pain several times and can be fine for a few years after seeing a Land.   + 350 cc. No weight this am. Creatinine 1.3. K 4.6.  RHC Procedural Findings (1/26, off milrinone): Hemodynamics (mmHg) RA mean 1 RV 36/8 PA 36/11, mean 21 PCWP mean 7 Oxygen saturations: PA 62% AO 96% Cardiac Output (Fick) 6.58  Cardiac Index (Fick) 3.06 Cardiac Output (Thermo) 6.62 Cardiac Index (Thermo) 3.08  Objective:   Weight Range: 205 lb (93 kg) Body mass index is 28.59 kg/m.   Vital Signs:   Temp:  [97.5 F (36.4 C)-98.2 F (36.8 C)] 97.5 F (36.4 C) (01/30 0756) Pulse Rate:  [67-91] 80 (01/30 0600) Resp:  [11-25] 18 (01/30 0600) BP: (88-123)/(56-76) 106/66 (01/30 0600) SpO2:  [95 %-100 %] 97 % (01/30 0600) Arterial Line BP: (80-123)/(41-56) 96/46 (01/30 0600) Last BM Date: 03/22/16  Weight change: Filed Weights   03/21/16 0409 03/22/16 0438 03/23/16 0357  Weight: 207 lb 9.6 oz (94.2 kg) 206 lb 8 oz (93.7 kg) 205 lb (93 kg)    Intake/Output:   Intake/Output Summary (Last 24 hours) at 03/24/16 0851 Last data filed  at 03/24/16 0800  Gross per 24 hour  Intake             2015 ml  Output             2255 ml  Net             -240 ml     Physical Exam: General: Sitting up in bed. Fatigued appearing.     HEENT: Normal Neck: supple. JVP ~8 cm. Carotids 2+ bilat; no bruits. No thyromegaly or nodule noted.  Cor: PMI nondisplaced. Regular. No M/G/R   Left chest dressing C/D/I s/p ICD extraction. R sided pacer site stable.  Lungs: Clear anteriorly, normal effort.    Abdomen: soft, NT, ND, no HSM. No bruits or masses. +BS  Extremities: no cyanosis, clubbing, rash. No peripheral edema.   Neuro: alert & oriented x 3, cranial nerves grossly intact. moves all 4 extremities w/o difficulty. Affect pleasant.  Telemetry: Reviewed, A sensed V paced.   Labs: CBC  Recent Labs  03/23/16 2330 03/24/16 0420  WBC 11.5* 9.9  HGB 10.7* 10.2*  HCT 32.1* 30.5*  MCV 92.2 92.1  PLT 251 255   Basic Metabolic Panel  Recent Labs  03/23/16 0440 03/23/16 2002 03/23/16 2330 03/24/16 0420  NA 135 137  --  135  K 3.6 4.3  --  4.6  CL 96*  --   --  99*  CO2 28  --   --  26  GLUCOSE 102*  --   --  106*  BUN 24*  --   --  20  CREATININE 1.31*  --  1.22 1.31*  CALCIUM 9.4  --   --  9.3   Liver Function Tests No results for input(s): AST, ALT, ALKPHOS, BILITOT, PROT, ALBUMIN in the last 72 hours. No results for input(s): LIPASE, AMYLASE in the last 72 hours. Cardiac Enzymes No results for input(s): CKTOTAL, CKMB, CKMBINDEX, TROPONINI in the last 72 hours.  BNP: BNP (last 3 results)  Recent Labs  01/13/16 1008 01/27/16 1155 03/17/16 0311  BNP 256.0* 595.7* 894.5*    ProBNP (last 3 results) No results for input(s): PROBNP in the last 8760 hours.   D-Dimer No results for input(s): DDIMER in the last 72 hours. Hemoglobin A1C No results for input(s): HGBA1C in the last 72 hours. Fasting Lipid Panel No results for input(s): CHOL, HDL, LDLCALC, TRIG, CHOLHDL, LDLDIRECT in the last 72 hours. Thyroid  Function Tests No results for input(s): TSH, T4TOTAL, T3FREE, THYROIDAB in the last 72 hours.  Invalid input(s): FREET3  Other results:     Imaging/Studies:  Dg Chest 2 View  Result Date: 03/24/2016 CLINICAL DATA:  Pacer removal EXAM: CHEST  2 VIEW COMPARISON:  7 days ago FINDINGS: Single chamber pacer from the right with lead in the expected location of the right ventricle. Left-sided pacer has been removed. Cardiopericardial size is stable. Cardiomegaly and coronary stent. Low volume chest without pneumothorax. Chest wall emphysema on the left, presumably related to dissection of generator pack. IMPRESSION: 1. Stable cardiopericardial size after left-sided pacer removal. Left chest wall gas, no pneumothorax. 2. New right-sided pacer with right ventricular lead. 3. Low volume chest. Electronically Signed   By: Marnee Spring M.D.   On: 03/24/2016 08:39      Medications:     Scheduled Medications: . amiodarone  200 mg Oral Daily  . aspirin EC  81 mg Oral Daily  . atorvastatin  40 mg Oral q1800  . bisoprolol  2.5 mg Oral Daily  . cefTRIAXone (ROCEPHIN)  IV  2 g Intravenous Q24H  . clopidogrel  75 mg Oral Daily  . cyclobenzaprine      . digoxin  0.125 mg Oral QODAY  . docusate sodium  100 mg Oral BID  . famotidine  20 mg Oral BID  . furosemide  60 mg Oral BID  . heparin  5,000 Units Subcutaneous Q8H  . lidocaine  1 patch Transdermal Q24H  . losartan  25 mg Oral Daily  . magnesium oxide  400 mg Oral Daily  . mexiletine  200 mg Oral Q12H  . nicotine  21 mg Transdermal Daily  . PARoxetine  10 mg Oral Daily  . spironolactone  25 mg Oral QHS    Infusions: . lactated ringers 10 mL/hr at 03/24/16 0700    PRN Medications: acetaminophen, acetaminophen, albuterol, ALPRAZolam, cyclobenzaprine, hydrOXYzine, nitroGLYCERIN, ondansetron (ZOFRAN) IV, ondansetron (ZOFRAN) IV, oxyCODONE, oxyCODONE-acetaminophen, sodium chloride flush, traMADol   Assessment/Plan   1. Acute on  chronic systolic CHF: Echo 11/2015 LVEF 15%, ischemic cardiomyopathy.  Initially on milrinone and diuresed, now off milrinone and back on po Lasix.  See RHC above => filling pressures optimized and CO preserved off milrinone.  - Initial presentation may have been related to Coag negative Staph.  - Have begun LVAD work up.  - TEE 1/26; LVEF 15% mod-severe MR.  Coox 59.3% off milrinone yesterday.  PICC line removed for procedure yesterday.  - Continue  lasix 60 mg po BID.    - Home dose bisoprolol 2.5 mg daily restarted 1/26    - Continue spiro 25 mg daily - Continue losartan 25 mg daily.  - Continue digoxin 0.125 mg daily. Level 0.2 on admit.  - On going work up for LVAD. May eventually be transplant candidate. Just stopped smoking PTA. VAD coordinator and Dr. Laneta SimmersBartle have seen. HFSW to plan family meeting. 2. CAD: Recent LHC in 12/17 with stable coronary disease  - Continue statin, ASA, Plavix.   3. Ascending aortic aneurysm: 4.1 cm 8/17. No evidence of dissection on CTA 03/17/16.  4. VT storm in 12/17: Continue amiodarone and Mexiletine.  - EP following with lead extraction.  - No VT on tele.    - Will need [employer] on discharge. Have notified rep. Will complete paper work.  - Now s/p ICD extraction and placement of temporary-permanent pacemaker.  5. Low back pain:  Suspect pain in musculoskeletal.  - Chronic. Working with PT. No discitis/abscess on CT abdomen/pelvis.  - Continue flexeril and tramadol prn.  - Ortho saw, suspect MSK in origin. Agree with current regimen. He is unable to get MRI due to ICD 6. 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  - s/p ICD extraction due to infected pocked.  - Had temporary-permanent device placed with RV pacing.   7. ID: Blood cultures (done for back pain, ?discitis given concerns about ICD pocket) returned 2/2 coag negative Staph.  ICD now eroded with drainage - s/p Extraction 03/23/16 as above.  - Continue  ceftriaxone IV per ID. Will be on until 04/19/16. Blood cultures re-drawn 1/25. NGTD.  - TEE 1/26 no vegetation   Graciella FreerMichael Andrew Tillery, [place] JerseyPA-C  03/24/2016 8:51 AM   Advanced Heart Failure Team Pager [phone] (M-F; 7a - 4p)  Please contact [facility] for night-coverage after hours (4p -7a ) and weekends on [email or web address]  Patient seen with PA, agree with the above note.  CRT-D device removed yesterday and PICC line removed.  He is pacemaker dependent and now has a temp-perm pacer in place.  Doing ok today, volume status appears stable.  - Continue current cardiac meds.   Blood cultures done today.  If remain negative, could potentially replace device on 2/2.  He will eventually need PICC replaced for 1 month IV abx (wait until closer to discharge).    Marca AnconaDalton Jasmine Maceachern 03/24/2016 1:38 PM

## 2016-03-25 DIAGNOSIS — R309 Painful micturition, unspecified: Secondary | ICD-10-CM

## 2016-03-25 LAB — BASIC METABOLIC PANEL
Anion gap: 10 (ref 5–15)
BUN: 20 mg/dL (ref 6–20)
CALCIUM: 8.5 mg/dL — AB (ref 8.9–10.3)
CO2: 25 mmol/L (ref 22–32)
CREATININE: 1.19 mg/dL (ref 0.61–1.24)
Chloride: 96 mmol/L — ABNORMAL LOW (ref 101–111)
GFR calc Af Amer: >60 mL/min (ref >60)
GLUCOSE: 86 mg/dL (ref 65–99)
Potassium: 3.8 mmol/L (ref 3.5–5.1)
SODIUM: 131 mmol/L — AB (ref 135–145)

## 2016-03-25 LAB — CULTURE, BLOOD (ROUTINE X 2)
CULTURE: NO GROWTH
Culture: NO GROWTH

## 2016-03-25 LAB — CBC
HCT: 30.8 % — ABNORMAL LOW (ref 39.0–52.0)
Hemoglobin: 10.2 g/dL — ABNORMAL LOW (ref 13.0–17.0)
MCH: 30.7 pg (ref 26.0–34.0)
MCHC: 33.1 g/dL (ref 30.0–36.0)
MCV: 92.8 fL (ref 78.0–100.0)
Platelets: 236 10*3/uL (ref 150–400)
RBC: 3.32 MIL/uL — ABNORMAL LOW (ref 4.22–5.81)
RDW: 15.3 % (ref 11.5–15.5)
WBC: 10.2 10*3/uL (ref 4.0–10.5)

## 2016-03-25 LAB — MAGNESIUM: Magnesium: 1.7 mg/dL (ref 1.7–2.4)

## 2016-03-25 MED ORDER — MAGNESIUM SULFATE 2 GM/50ML IV SOLN
2.0000 g | Freq: Once | INTRAVENOUS | Status: AC
  Administered 2016-03-25: 2 g via INTRAVENOUS
  Filled 2016-03-25: qty 50

## 2016-03-25 NOTE — Progress Notes (Signed)
Patient ID: [name], male   DOB: 07/22/1955, 61 y.o.   MRN: [id number]     Advanced Heart Failure Rounding Note  Primary Cardiologist: Dr Jens Somrenshaw Primary HF: [name]   Subjective:    Admitted from ED 03/17/16 with recurrent low output HF with NYHA IIIb-IV symptoms.   He was on milrinone initially with diuresis, now off.  RHC 1/26 looked good.   BCx 03/18/16 2/2 coag negative staph. ICD pocked noted to have serous drainage and exposed ICD. CT abdomen/pelvis did not show evidence for discitis or acute MSK changes Afebrile. WBCs WNL currently.   S/p ICD extraction 03/23/16 with placement of Temporary permanent pacemaker with previous pacer dependency due to CHB.   TEE on 1/26 with no evidence of vegetation.   Meds held with orthostasis 03/24/16. Given gentle IVF.   Remains orthostatic.  Started on levophed overnight. Now on 2 mcg/min.   Weight up 1 lb. Creatinine stable.   RHC Procedural Findings (1/26, off milrinone): Hemodynamics (mmHg) RA mean 1 RV 36/8 PA 36/11, mean 21 PCWP mean 7 Oxygen saturations: PA 62% AO 96% Cardiac Output (Fick) 6.58  Cardiac Index (Fick) 3.06 Cardiac Output (Thermo) 6.62 Cardiac Index (Thermo) 3.08  Objective:   Weight Range: 212 lb 1.3 oz (96.2 kg) Body mass index is 29.58 kg/m.   Vital Signs:   Temp:  [97.7 F (36.5 C)-98.6 F (37 C)] 97.7 F (36.5 C) (01/31 0700) Pulse Rate:  [80-81] 81 (01/31 0800) Resp:  [11-20] 20 (01/31 0800) BP: (67-113)/(41-71) 98/54 (01/31 0800) SpO2:  [91 %-100 %] 100 % (01/31 0800) Weight:  [212 lb 1.3 oz (96.2 kg)] 212 lb 1.3 oz (96.2 kg) (01/31 0500) Last BM Date: 03/22/16  Weight change: Filed Weights   03/23/16 0357 03/24/16 0800 03/25/16 0500  Weight: 205 lb (93 kg) 211 lb 3.2 oz (95.8 kg) 212 lb 1.3 oz (96.2 kg)    Intake/Output:   Intake/Output Summary (Last 24 hours) at 03/25/16 1041 Last data filed at 03/25/16 0800  Gross per 24 hour  Intake          1150.19 ml  Output              2225 ml  Net         -1074.81 ml     Physical Exam: General: Lying in bed. Elderly and fatigued appearing.      HEENT: Normal Neck: supple. JVP ~7-8 cm. Carotids 2+ bilat; no bruits. No thyromegaly or nodule noted.  Cor: PMI nondisplaced. Regular. No M/G/R   Left chest dressing C/D/I s/p ICD extraction. R sided pacer site stable.  Lungs: CTAB, normal effort Abdomen: soft, NT, ND, no HSM. No bruits or masses. +BS  Extremities: no cyanosis, clubbing, rash. No edema.    Neuro: alert & oriented x 3, cranial nerves grossly intact. moves all 4 extremities w/o difficulty. Affect pleasant.  Telemetry: Reviewed, A sensed V paced.   Labs: CBC  Recent Labs  03/24/16 0420 03/25/16 0640  WBC 9.9 10.2  HGB 10.2* 10.2*  HCT 30.5* 30.8*  MCV 92.1 92.8  PLT 255 236   Basic Metabolic Panel  Recent Labs  03/24/16 0420 03/25/16 0357  NA 135 131*  K 4.6 3.8  CL 99* 96*  CO2 26 25  GLUCOSE 106* 86  BUN 20 20  CREATININE 1.31* 1.19  CALCIUM 9.3 8.5*  MG  --  1.7   Liver Function Tests No results for input(s): AST, ALT, ALKPHOS, BILITOT, PROT, ALBUMIN in  the last 72 hours. No results for input(s): LIPASE, AMYLASE in the last 72 hours. Cardiac Enzymes No results for input(s): CKTOTAL, CKMB, CKMBINDEX, TROPONINI in the last 72 hours.  BNP: BNP (last 3 results)  Recent Labs  01/13/16 1008 01/27/16 1155 03/17/16 0311  BNP 256.0* 595.7* 894.5*    ProBNP (last 3 results) No results for input(s): PROBNP in the last 8760 hours.   D-Dimer No results for input(s): DDIMER in the last 72 hours. Hemoglobin A1C No results for input(s): HGBA1C in the last 72 hours. Fasting Lipid Panel No results for input(s): CHOL, HDL, LDLCALC, TRIG, CHOLHDL, LDLDIRECT in the last 72 hours. Thyroid Function Tests No results for input(s): TSH, T4TOTAL, T3FREE, THYROIDAB in the last 72 hours.  Invalid input(s): FREET3  Other results:     Imaging/Studies:  No results  found.    Medications:     Scheduled Medications: . amiodarone  200 mg Oral Daily  . aspirin EC  81 mg Oral Daily  . atorvastatin  40 mg Oral q1800  .  ceFAZolin (ANCEF) IV  2 g Intravenous Q8H  . clopidogrel  75 mg Oral Daily  . digoxin  0.125 mg Oral QODAY  . docusate sodium  100 mg Oral BID  . famotidine  20 mg Oral BID  . heparin  5,000 Units Subcutaneous Q8H  . lidocaine  1 patch Transdermal Q24H  . magnesium oxide  400 mg Oral Daily  . mexiletine  200 mg Oral Q12H  . nicotine  21 mg Transdermal Daily  . PARoxetine  10 mg Oral Daily    Infusions: . lactated ringers 10 mL/hr at 03/24/16 0700  . norepinephrine (LEVOPHED) Adult infusion 1 mcg/min (03/25/16 0700)    PRN Medications: acetaminophen, albuterol, ALPRAZolam, cyclobenzaprine, hydrOXYzine, nitroGLYCERIN, ondansetron (ZOFRAN) IV, oxyCODONE-acetaminophen, sodium chloride flush, traMADol   Assessment/Plan   1. Acute on chronic systolic CHF: Echo 11/2015 LVEF 15%, ischemic cardiomyopathy.  Initially on milrinone and diuresed, now off milrinone and back on po Lasix.  See RHC above => filling pressures optimized and CO preserved off milrinone.  - Initial presentation may have been related to Coag negative Staph.  - Have begun LVAD work up.  - TEE 1/26; LVEF 15% mod-severe MR.  - Lasix held with orthostasis.    - Bisoprolol, spironolactone, and losartan held with orthostatic hypotension.  - Continue digoxin 0.125 mg daily. Level 0.2 on admit.  - On going work up for LVAD. May eventually be transplant candidate. Just stopped smoking PTA. VAD coordinator and Dr. Laneta Simmers have seen. HFSW to plan family meeting. 2. CAD: Recent LHC in 12/17 with stable coronary disease  - Continue statin, ASA, Plavix.   3. Ascending aortic aneurysm: 4.1 cm 8/17. No evidence of dissection on CTA 03/17/16.  4. VT storm in 12/17: Continue amiodarone and Mexiletine.  - EP following with lead extraction.  - No VT on tele.   - Now s/p ICD  extraction and placement of temporary-permanent pacemaker.  ICD to re-implanted 03/27/16.  5. Low back pain:  Suspect pain in musculoskeletal.  - Chronic. Working with PT. No discitis/abscess on CT abdomen/pelvis.  - Continue flexeril and tramadol prn.  - Ortho saw, suspect MSK in origin. Agree with current regimen. He is unable to get MRI due to ICD - No change to current plan.  6. 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  - s/p ICD extraction due to infected pocked.  - Had temporary-permanent device placed with  RV pacing.  To have PPM/ICD replaced 03/27/16. 7. ID: Blood cultures (done for back pain, ?discitis given concerns about ICD pocket) returned 2/2 coag negative Staph.  ICD eroded with drainage from site.  - s/p Extraction 03/23/16 as above.  - Continue ceftriaxone IV per ID. Will be on until 04/19/16 (will need PICC) Blood cultures re-drawn 1/25. NGTD.  - TEE 1/26 no vegetation  - Per ID OK to re-implant CRT-D as soon as 03/27/16.  [name] Freer, PA-C  03/25/2016 10:41 AM   Advanced Heart Failure Team Pager [phone] (M-F; 7a - 4p)  Please contact [facility] for night-coverage after hours (4p -7a ) and weekends on [email or web address]  Patient seen with PA, agree with the above note.  Main issue today is orthostatic hypotension.  SBP to 70s with standing.  He was started on norepinephrine overnight. Afebrile, WBCs not elevated, blood cultures negative.  Possibly just dry given results of RHC (low PCWP and RA pressure).  However, this also happened after ICD removed and temp-perm pacemaker placed.  - Continue to hold Lasix, spironolactone, losartan, bisoprolol => hopefully orthostasis will resolve with mild volume expansion and holding BP-active meds.   - Would stop norepinephrine (BP adequate, at least when lying in bed).   - Place ted hose.    Tentative plan for replacement of ICD on Friday, given dependence on pacing, plan for possible His bundle  pacing (concern with pro-arrhythmia of LV pacing).    Marca Ancona 03/25/2016 12:59 PM

## 2016-03-25 NOTE — Progress Notes (Signed)
Occupational Therapy Treatment Patient Details Name: [name] MRN: [id number] DOB: 10/31/1955 Today's Date: 03/25/2016    History of present illness Pt is a 61 y/o male admitted secondary to worsening SOB, acute CHF exacerbation. 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 s/p cardioversion 03/20/16. [name]. Pt underwent removal of pacemaker and then placement of a new one.   OT comments  Session limited due to orthostatic BP and back pain. Pt able to perform stand pivot transfer with min hand held assist. Required set up for grooming tasks sitting EOB unsupported. Encouraged OOB multiple times per day with RN staff; pt verbalized understanding. D/c plan remains appropriate. Will continue to follow acutely.  BP sitting 110/69 Standing 85/62 Supine 109/63   Follow Up Recommendations  No OT follow up;Supervision - Intermittent (pending hospitalization, will continue to assess)    Equipment Recommendations  Other (comment) (TBD)    Recommendations for Other Services      Precautions / Restrictions Precautions Precautions: ICD/Pacemaker Precaution Comments: pt aware of precautions and able to recall Restrictions Weight Bearing Restrictions: Yes Other Position/Activity Restrictions: bilat UE with limited lifting and pushing d/t pacemaker precautions       Mobility Bed Mobility Overal bed mobility: Needs Assistance Bed Mobility: Rolling;Sit to Sidelying Rolling: Min guard       Sit to sidelying: Min assist General bed mobility comments: Assist for LEs back to bed. Cues for technique. HOB flat without use of bed rail.  Transfers Overall transfer level: Needs assistance Equipment used: 1 person hand held assist Transfers: Sit to/from UGI CorporationStand;Stand Pivot Transfers Sit to Stand: Min assist Stand pivot transfers: Min assist       General transfer comment: increased time. Min hand held assist  for safety    Balance Overall balance assessment: Needs assistance Sitting-balance support: No upper extremity supported;Feet supported Sitting balance-Leahy Scale: Fair       Standing balance-Leahy Scale: Fair                     ADL Overall ADL's : Needs assistance/impaired Eating/Feeding: Modified independent;Sitting Eating/Feeding Details (indicate cue type and reason): pt finishing lunch upon arrival Grooming: Set up;Sitting;Wash/dry face;Oral care Grooming Details (indicate cue type and reason): sitting unsupported EOB                 Toilet Transfer: Minimal assistance;Stand-pivot Toilet Transfer Details (indicate cue type and reason): hand held assist. Simulated by transfer from chair to bed         Functional mobility during ADLs: Minimal assistance (hand held assist) General ADL Comments: Limited activity today due to orthostatic BP. BP in sitting 110/69, standing 85/62, supine 109/63.      Vision                     Perception     Praxis      Cognition   Behavior During Therapy: WFL for tasks assessed/performed Overall Cognitive Status: Within Functional Limits for tasks assessed                       Extremity/Trunk Assessment               Exercises     Shoulder Instructions       General Comments      Pertinent Vitals/ Pain       Pain Assessment: Faces Faces Pain  Scale: Hurts even more Pain Location: lower back Pain Descriptors / Indicators: Aching;Sore Pain Intervention(s): Monitored during session;Repositioned;Limited activity within patient's tolerance  Home Living                                          Prior Functioning/Environment              Frequency  Min 2X/week        Progress Toward Goals  OT Goals(current goals can now be found in the care plan section)  Progress towards OT goals: Not progressing toward goals - comment (orthostatic BP)  Acute Rehab OT  Goals Patient Stated Goal: feel better OT Goal Formulation: With patient  Plan Discharge plan remains appropriate    Co-evaluation                 End of Session     Activity Tolerance Treatment limited secondary to medical complications (Comment) (orthostatic BP)   Patient Left in bed;with call bell/phone within reach   Nurse Communication Mobility status;Other (comment) (BP)        Time: 1350-1404 OT Time Calculation (min): 14 min  Charges: OT General Charges $OT Visit: 1 Procedure OT Treatments $Self Care/Home Management : 8-22 mins  Gaye Alken M.S., OTR/L Pager: [phone]  03/25/2016, 2:12 PM

## 2016-03-25 NOTE — Progress Notes (Signed)
[facility]  [name] is a new pt for [facility] this hospital admission.  Met with pt tonight to advise [facility] will be following pt to support home IV ABX and HH services as ordered/needed upon DC.  [facility] hospital team will follow pt until DC to ensure [facility] needs are met as ordered.   If patient discharges after hours, please call [phone].   Larry Sierras 03/25/2016, 10:54 PM

## 2016-03-25 NOTE — Care Management Note (Signed)
Case Management Note Original Note initiated by Mable FillBrenda Bigelow 03/23/16  Patient Details  Name: [name] MRN: [id number] Date of Birth: 10/07/1955  Subjective/Objective:  Pt presented for worsening SOB and weight gain. Initiated on IV Lasix and IV Milrinone gtt. Per MD notes plan to wean Milrinone and plan for RHC on 03-20-16. Pt is from home with sister in [place].                     Action/Plan: LVAD workup in process. CM will continue to monitor for home needs.   Expected Discharge Date:                  Expected Discharge Plan:  Home w Home Health Services  In-House Referral:  NA  Discharge planning Services  CM Consult  Post Acute Care Choice:    Choice offered to:     DME Arranged:    DME Agency:     HH Arranged:    HH Agency:     Status of Service:  In process, will continue to follow  If discussed at Long Length of Stay Meetings, dates discussed:  03-24-16 Additional Comments: 03/25/2016 CM confirmed with [employer] liaison that pt has been approved for vest, agency request call back closer to discharge date to schedule fitting.  Pt remains on pressor   03/24/16 Pt is now s/p S/p ICD extraction 03/23/16 with placement of Temporary permanent pacemaker with previous pacer dependency due to CHB  1248 03-23-16 [name] BambergerBrenda Graves-Bigelow, RN,BSN [phone] CM did fax information to [employer] for [employer]. Awaiting Insurance Authorization at this time. ICD pocket infection - for extraction today.  CM will continue to monitor.  [name], [name], RN 03/25/2016, 10:31 AM

## 2016-03-25 NOTE — Progress Notes (Signed)
Physical Therapy Treatment Patient Details Name: [name] MRN: [id number] DOB: 05/10/1955 Today's Date: 03/25/2016    History of Present Illness Pt is a 61 y/o male admitted secondary to worsening SOB, acute CHF exacerbation. 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 s/p cardioversion 03/20/16. [name]. Pt underwent removal of pacemaker and then placement of a new one.    PT Comments    Pt s/p removal and placement of pacemaker. Pt desires to participate in OOB mobility however orthostatic BP is limiting pt's mobility. BP 95/64 in bed, 77/44 in sitting and 79/52 in standing. Pt with c/o of dizziness/lightheadedness. Pt  Also limited by back pain. Acute PT to con't to follow and progress mobility as able.  Follow Up Recommendations  Home health PT;Supervision/Assistance - 24 hour     Equipment Recommendations  Rolling walker with 5" wheels    Recommendations for Other Services       Precautions / Restrictions Precautions Precautions: ICD/Pacemaker Precaution Comments: pt aware of precautions and able to recall Restrictions Weight Bearing Restrictions: Yes Other Position/Activity Restrictions: bilat UE with limited lifting and pushing d/t pacemaker precautions    Mobility  Bed Mobility Overal bed mobility: Needs Assistance Bed Mobility: Rolling;Sidelying to Sit Rolling: Min assist Sidelying to sit: Mod assist;Max assist       General bed mobility comments: increased time due to back pain, assist for trunk elevation due to back pain, pt able to manage LEs off EOB  Transfers Overall transfer level: Needs assistance Equipment used: 1 person hand held assist Transfers: Sit to/from UGI CorporationStand;Stand Pivot Transfers Sit to Stand: Min assist Stand pivot transfers: Min assist;+2 safety/equipment (line management)       General transfer comment: increased time, antalgic steps to chair,limted by back  pain  Ambulation/Gait             General Gait Details: unable to amb due to drop in BP in standing to 65/44   Stairs            Wheelchair Mobility    Modified Rankin (Stroke Patients Only)       Balance Overall balance assessment: Needs assistance Sitting-balance support: Single extremity supported;Feet supported Sitting balance-Leahy Scale: Fair     Standing balance support: No upper extremity supported Standing balance-Leahy Scale: Fair Standing balance comment: stood x 2 min while BP took, denied dizziness but then onset once returned to sitting                    Cognition Arousal/Alertness: Awake/alert Behavior During Therapy: WFL for tasks assessed/performed Overall Cognitive Status: Within Functional Limits for tasks assessed                      Exercises General Exercises - Lower Extremity Ankle Circles/Pumps: AROM;Both;10 reps;Seated Long Arc Quad: AROM;Both;20 reps;Seated    General Comments General comments (skin integrity, edema, etc.): dressings intact      Pertinent Vitals/Pain Pain Assessment: 0-10 Pain Score: 7  Pain Location: low back Pain Descriptors / Indicators: Aching Pain Intervention(s): Monitored during session    Home Living                      Prior Function            PT Goals (current goals can now be found in the care plan section) Acute Rehab PT Goals Patient Stated Goal:  walk Progress towards PT goals: Not progressing toward goals - comment (due to othrostatic BP)    Frequency    Min 3X/week      PT Plan Discharge plan needs to be updated    Co-evaluation             End of Session Equipment Utilized During Treatment: Gait belt Activity Tolerance: Other (comment) (limited by orthostatic BP) Patient left: in chair;with call bell/phone within reach;with nursing/sitter in room     Time: 0851-0908 PT Time Calculation (min) (ACUTE ONLY): 17 min  Charges:  $Therapeutic  Activity: 8-22 mins                    G CodesRozell Searing Archana Eckman 04-08-2016, 10:44 AM   Mcdaniel Shock, PT, DPT Pager #: [phone] Office #: [phone]

## 2016-03-25 NOTE — Progress Notes (Addendum)
[facility]   Reason for visit: Follow up on pocket infection  Interval History: no new complaints, tired, weakness; no associated n/v/d; burning with urination  Physical Exam: Constitutional:  Vitals:   03/25/16 0700 03/25/16 0800  BP: (!) 92/56 (!) 98/54  Pulse: 80 81  Resp: 13 20  Temp: 97.7 F (36.5 C)    patient appears in NAD Eyes: anicteric Respiratory: Normal respiratory effort; CTA B Cardiovascular: RRR GI: soft, nt, nd Neuro: non focal Skin: no rashes, pocket with bandage  Review of Systems: Constitutional: negative for fevers and chills Respiratory: negative for cough Gastrointestinal: negative for diarrhea  Lab Results  Component Value Date   WBC 10.2 03/25/2016   HGB 10.2 (L) 03/25/2016   HCT 30.8 (L) 03/25/2016   MCV 92.8 03/25/2016   PLT 236 03/25/2016    Lab Results  Component Value Date   CREATININE 1.19 03/25/2016   BUN 20 03/25/2016   NA 131 (L) 03/25/2016   K 3.8 03/25/2016   CL 96 (L) 03/25/2016   CO2 25 03/25/2016    Lab Results  Component Value Date   ALT 17 03/17/2016   AST 17 03/17/2016   ALKPHOS 47 03/17/2016     Microbiology: Recent Results (from the past 240 hour(s))  MRSA PCR Screening     Status: None   Collection Time: 03/17/16  5:15 PM  Result Value Ref Range Status   MRSA by PCR NEGATIVE NEGATIVE Final    Comment:        The GeneXpert MRSA Assay (FDA approved for NASAL specimens only), is one component of a comprehensive MRSA colonization surveillance program. It is not intended to diagnose MRSA infection nor to guide or monitor treatment for MRSA infections.   Culture, blood (Routine X 2) w Reflex to ID Panel     Status: Abnormal   Collection Time: 03/18/16 10:00 AM  Result Value Ref Range Status   Specimen Description BLOOD LEFT ANTECUBITAL  Final   Special Requests IN PEDIATRIC BOTTLE  2CC  Final   Culture  Setup Time   Final    GRAM POSITIVE COCCI IN CLUSTERS IN PEDIATRIC  BOTTLE CRITICAL VALUE NOTED.  VALUE IS CONSISTENT WITH PREVIOUSLY REPORTED AND CALLED VALUE.    Culture (A)  Final    STAPHYLOCOCCUS EPIDERMIDIS SUSCEPTIBILITIES PERFORMED ON PREVIOUS CULTURE WITHIN THE LAST 5 DAYS.    Report Status 03/21/2016 FINAL  Final  Culture, blood (Routine X 2) w Reflex to ID Panel     Status: Abnormal   Collection Time: 03/18/16 10:06 AM  Result Value Ref Range Status   Specimen Description BLOOD LEFT HAND  Final   Special Requests IN PEDIATRIC BOTTLE  3CC  Final   Culture  Setup Time   Final    GRAM POSITIVE COCCI IN CLUSTERS IN PEDIATRIC BOTTLE CRITICAL RESULT CALLED TO, READ BACK BY AND VERIFIED WITH: PHARMD A JOHNSTON [id number] MLM    Culture STAPHYLOCOCCUS EPIDERMIDIS (A)  Final   Report Status 03/21/2016 FINAL  Final   Organism ID, Bacteria STAPHYLOCOCCUS EPIDERMIDIS  Final      Susceptibility   Staphylococcus epidermidis - MIC*    CIPROFLOXACIN <=0.5 SENSITIVE Sensitive     ERYTHROMYCIN <=0.25 SENSITIVE Sensitive     GENTAMICIN <=0.5 SENSITIVE Sensitive     OXACILLIN <=0.25 SENSITIVE Sensitive     TETRACYCLINE <=1 SENSITIVE Sensitive     VANCOMYCIN 1 SENSITIVE Sensitive     TRIMETH/SULFA <=10 SENSITIVE Sensitive  CLINDAMYCIN <=0.25 SENSITIVE Sensitive     RIFAMPIN <=0.5 SENSITIVE Sensitive     Inducible Clindamycin NEGATIVE Sensitive     * STAPHYLOCOCCUS EPIDERMIDIS  Blood Culture ID Panel (Reflexed)     Status: Abnormal   Collection Time: 03/18/16 10:06 AM  Result Value Ref Range Status   Enterococcus species NOT DETECTED NOT DETECTED Final   Listeria monocytogenes NOT DETECTED NOT DETECTED Final   Staphylococcus species DETECTED (A) NOT DETECTED Final    Comment: CRITICAL RESULT CALLED TO, READ BACK BY AND VERIFIED WITH: PHARMD A JOHNSTON [id number] MLM    Staphylococcus aureus NOT DETECTED NOT DETECTED Final   Methicillin resistance NOT DETECTED NOT DETECTED Final   Streptococcus species NOT DETECTED NOT DETECTED Final    Streptococcus agalactiae NOT DETECTED NOT DETECTED Final   Streptococcus pneumoniae NOT DETECTED NOT DETECTED Final   Streptococcus pyogenes NOT DETECTED NOT DETECTED Final   Acinetobacter baumannii NOT DETECTED NOT DETECTED Final   Enterobacteriaceae species NOT DETECTED NOT DETECTED Final   Enterobacter cloacae complex NOT DETECTED NOT DETECTED Final   Escherichia coli NOT DETECTED NOT DETECTED Final   Klebsiella oxytoca NOT DETECTED NOT DETECTED Final   Klebsiella pneumoniae NOT DETECTED NOT DETECTED Final   Proteus species NOT DETECTED NOT DETECTED Final   Serratia marcescens NOT DETECTED NOT DETECTED Final   Haemophilus influenzae NOT DETECTED NOT DETECTED Final   Neisseria meningitidis NOT DETECTED NOT DETECTED Final   Pseudomonas aeruginosa NOT DETECTED NOT DETECTED Final   Candida albicans NOT DETECTED NOT DETECTED Final   Candida glabrata NOT DETECTED NOT DETECTED Final   Candida krusei NOT DETECTED NOT DETECTED Final   Candida parapsilosis NOT DETECTED NOT DETECTED Final   Candida tropicalis NOT DETECTED NOT DETECTED Final  Culture, blood (routine x 2)     Status: None (Preliminary result)   Collection Time: 03/19/16 11:34 PM  Result Value Ref Range Status   Specimen Description BLOOD LEFT ARM  Final   Special Requests BOTTLES DRAWN AEROBIC AND ANAEROBIC  Final   Culture NO GROWTH 4 DAYS  Final   Report Status PENDING  Incomplete  Culture, blood (routine x 2)     Status: None (Preliminary result)   Collection Time: 03/19/16 11:43 PM  Result Value Ref Range Status   Specimen Description BLOOD LEFT HAND  Final   Special Requests AEROBIC BOTTLE ONLY  Final   Culture NO GROWTH 4 DAYS  Final   Report Status PENDING  Incomplete  MRSA PCR Screening     Status: None   Collection Time: 03/23/16 11:27 PM  Result Value Ref Range Status   MRSA by PCR NEGATIVE NEGATIVE Final    Comment:        The GeneXpert MRSA Assay (FDA approved for NASAL specimens only), is one  component of a comprehensive MRSA colonization surveillance program. It is not intended to diagnose MRSA infection nor to guide or monitor treatment for MRSA infections.   Culture, blood (routine x 2)     Status: None (Preliminary result)   Collection Time: 03/24/16  2:51 AM  Result Value Ref Range Status   Specimen Description BLOOD LEFT WRIST  Final   Special Requests IN PEDIATRIC BOTTLE 1CC  Final   Culture NO GROWTH < 12 HOURS  Final   Report Status PENDING  Incomplete  Culture, blood (routine x 2)     Status: None (Preliminary result)   Collection Time: 03/24/16  5:53 AM  Result Value Ref Range Status  Specimen Description BLOOD LEFT WRIST  Final   Special Requests BOTTLES DRAWN AEROBIC AND ANAEROBIC 5CC EACH  Final   Culture NO GROWTH < 12 HOURS  Final   Report Status PENDING  Incomplete    Impression/Plan:  1. 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  S/p removal.  Repeat blood cultures sent and ngtd.  If blood cultures from today remain negative at 72 hours, ok to replace device (on Friday 2/2 or later) If repeat blood cultures remain negative, 2 weeks post extraction treatment will be adequate through February 11.    2.  Medication monitoring - following cbc  3.  Burning with urination - UA without concerns for infection.  No treatment indicated.

## 2016-03-25 NOTE — Progress Notes (Signed)
Patient Name: [name] Date of Encounter: 03/25/2016     Active Problems:   Acute on chronic systolic congestive heart failure (HCC)   Bacteremia    SUBJECTIVE  S/p ICD system extraction. Stable. C/o back pain. No chest pain.  Breathing stable  Very dizzy yesterday with standing and hypotensive RHC numbers better than expected so LVAD not in immediate plans  CURRENT MEDS . amiodarone  200 mg Oral Daily  . aspirin EC  81 mg Oral Daily  . atorvastatin  40 mg Oral q1800  .  ceFAZolin (ANCEF) IV  2 g Intravenous Q8H  . clopidogrel  75 mg Oral Daily  . digoxin  0.125 mg Oral QODAY  . docusate sodium  100 mg Oral BID  . famotidine  20 mg Oral BID  . heparin  5,000 Units Subcutaneous Q8H  . lidocaine  1 patch Transdermal Q24H  . magnesium oxide  400 mg Oral Daily  . mexiletine  200 mg Oral Q12H  . nicotine  21 mg Transdermal Daily  . PARoxetine  10 mg Oral Daily    OBJECTIVE  Vitals:   03/25/16 0600 03/25/16 0630 03/25/16 0700 03/25/16 0800  BP: (!) 104/59 (!) 88/55 (!) 92/56 (!) 98/54  Pulse: 80 80 80 81  Resp: 15 13 13 20   Temp:   97.7 F (36.5 C)   TempSrc:   Oral   SpO2: 100% 99% 99% 100%  Weight:      Height:        Intake/Output Summary (Last 24 hours) at 03/25/16 1038 Last data filed at 03/25/16 0800  Gross per 24 hour  Intake          1150.19 ml  Output             2225 ml  Net         -1074.81 ml   Filed Weights   03/23/16 0357 03/24/16 0800 03/25/16 0500  Weight: 205 lb (93 kg) 211 lb 3.2 oz (95.8 kg) 212 lb 1.3 oz (96.2 kg)    PHYSICAL EXAM  General: Pleasant, NAD. Neuro: Alert and oriented X 3. Moves all extremities spontaneously. Psych: Normal affect. HEENT:  Normal  Neck: Supple without bruits or JVD. Indwelling right IJ PPM. Lungs:  Resp regular and unlabored, CTA. Heart: RRR no s3, s4, or murmurs. ICD pocket - with a small amount of serosanguinous drainage Abdomen: Soft, non-tender, non-distended, BS + x 4.  Extremities: No  clubbing, cyanosis or edema. DP/PT/Radials 2+ and equal bilaterally.  Accessory Clinical Findings  CBC  Recent Labs  03/24/16 0420 03/25/16 0640  WBC 9.9 10.2  HGB 10.2* 10.2*  HCT 30.5* 30.8*  MCV 92.1 92.8  PLT 255 236   Basic Metabolic Panel  Recent Labs  03/24/16 0420 03/25/16 0357  NA 135 131*  K 4.6 3.8  CL 99* 96*  CO2 26 25  GLUCOSE 106* 86  BUN 20 20  CREATININE 1.31* 1.19  CALCIUM 9.3 8.5*  MG  --  1.7   Liver Function Tests No results for input(s): AST, ALT, ALKPHOS, BILITOT, PROT, ALBUMIN in the last 72 hours. No results for input(s): LIPASE, AMYLASE in the last 72 hours. Cardiac Enzymes No results for input(s): CKTOTAL, CKMB, CKMBINDEX, TROPONINI in the last 72 hours. BNP Invalid input(s): POCBNP D-Dimer No results for input(s): DDIMER in the last 72 hours. Hemoglobin A1C No results for input(s): HGBA1C in the last 72 hours. Fasting Lipid Panel No results for input(s): CHOL, HDL,  LDLCALC, TRIG, CHOLHDL, LDLDIRECT in the last 72 hours. Thyroid Function Tests No results for input(s): TSH, T4TOTAL, T3FREE, THYROIDAB in the last 72 hours.  Invalid input(s): FREET3  TELE  Ventricular paced  Radiology/Studies  Dg Chest 2 View  Result Date: 03/24/2016 CLINICAL DATA:  Pacer removal EXAM: CHEST  2 VIEW COMPARISON:  7 days ago FINDINGS: Single chamber pacer from the right with lead in the expected location of the right ventricle. Left-sided pacer has been removed. Cardiopericardial size is stable. Cardiomegaly and coronary stent. Low volume chest without pneumothorax. Chest wall emphysema on the left, presumably related to dissection of generator pack. IMPRESSION: 1. Stable cardiopericardial size after left-sided pacer removal. Left chest wall gas, no pneumothorax. 2. New right-sided pacer with right ventricular lead. 3. Low volume chest. Electronically Signed   By: Marnee Spring M.D.   On: 03/24/2016 08:39   Dg Chest 2 View  Result Date:  03/17/2016 CLINICAL DATA:  Chest and lower back pain. History of 3 myocardial infarctions with stents. EXAM: CHEST  2 VIEW COMPARISON:  01/29/2016 CXR FINDINGS: Left-sided AICD device with coronary sinus, right atrial and right ventricular leads present. Cardiomegaly is identified with coronary arterial stenting. No aortic aneurysm. Chronic mild interstitial prominence without pneumonic consolidation, effusion or pneumothorax. Mild apical pleuroparenchymal thickening and scarring. No acute osseous abnormality. IMPRESSION: Cardiomegaly without acute pulmonary disease. Electronically Signed   By: Tollie Eth M.D.   On: 03/17/2016 03:53   Dg Lumbar Spine 2-3 Views  Result Date: 03/18/2016 CLINICAL DATA:  Acute low back pain without known injury. EXAM: LUMBAR SPINE - 2-3 VIEW COMPARISON:  CT scan of December 16, 2015. FINDINGS: No fracture or spondylolisthesis is noted. Atherosclerosis of abdominal aorta is noted. Mild degenerative disc disease is noted at L3-4. Remaining disc spaces appear intact. IMPRESSION: Mild degenerative disc disease at L3-4. No acute abnormality seen in the lumbar spine. Aortic atherosclerosis. Electronically Signed   By: Lupita Raider, M.D.   On: 03/18/2016 11:36   Dg Chest Port 1 View  Result Date: 03/17/2016 CLINICAL DATA:  PICC line placement EXAM: PORTABLE CHEST 1 VIEW COMPARISON:  03/17/2016 FINDINGS: The visualized bony structures of the thorax are intact. 2054 hours. Right PICC line tip overlies the distal SVC level. The cardio pericardial silhouette is enlarged. The lungs are clear wiithout focal pneumonia, edema, pneumothorax or pleural effusion. Left pacer/AICD again noted. IMPRESSION: 1. Right PICC line tip overlies the distal SVC. 2. Cardiomegaly. Electronically Signed   By: Kennith Center M.D.   On: 03/17/2016 21:05   Ct Angio Chest/abd/pel For Dissection W And/or Wo Contrast  Result Date: 03/17/2016 CLINICAL DATA:  Right-sided chest pain EXAM: CT ANGIOGRAPHY CHEST,  ABDOMEN AND PELVIS TECHNIQUE: Multidetector CT imaging through the chest, abdomen and pelvis was performed using the standard protocol during bolus administration of intravenous contrast. Multiplanar reconstructed images and MIPs were obtained and reviewed to evaluate the vascular anatomy. CONTRAST:  100 mL Isovue 370. COMPARISON:  Chest x-ray from earlier in the same day, 12/16/2015, 10/18/2015 FINDINGS: CTA CHEST FINDINGS Cardiovascular: The thoracic aorta shows mild atherosclerotic calcifications without aneurysmal dilatation or dissection. Pulmonary artery shows a normal branching pattern without evidence of pulmonary embolism. Coronary calcifications and prior coronary stenting is noted. A defibrillator is again noted. Cardiac structures are mildly enlarged but stable. Multiple chest wall venous collaterals are identified likely related to stenosis of the left subclavian vein from the defibrillator. Mediastinum/Nodes: Thoracic inlet is within normal limits. Stable mediastinal adenopathy is noted in the  right peritracheal and subcarinal region with the largest of these in the right peritracheal region measuring 16 mm in short axis which is stable from the prior exam. Scattered small hilar lymph nodes are noted also stable from the previous study. The esophagus as visualize is within normal limits. Lungs/Pleura: Mild emphysematous changes are noted. No focal infiltrate or sizable effusion is seen. Mild peribronchial thickening is noted which is stable from the prior exam likely of a chronic nature. No acute abnormality is seen. Musculoskeletal: Degenerative changes of the thoracic spine are noted. No acute bony abnormality is seen. Review of the MIP images confirms the above findings. CTA ABDOMEN AND PELVIS FINDINGS VASCULAR Aorta: The abdominal aorta demonstrates atherosclerotic calcifications without aneurysmal dilatation or evidence of dissection. Celiac: Mild atherosclerotic calcifications are noted at the  origin although the celiac axis is widely patent. SMA: Mild atherosclerotic changes are noted at the origin although the superior mesenteric artery is patent. Renals: Single renal arteries are identified bilaterally with mild atherosclerotic calcifications. No focal hemodynamically significant stenosis is noted. IMA: Widely patent Iliacs: Mild atherosclerotic changes are seen without aneurysmal dilatation or dissection. Veins: Although not timed for venous evaluation no definitive abnormality is seen. Review of the MIP images confirms the above findings. NON-VASCULAR Hepatobiliary: Multiple hypodensities are again identified within the liver consistent with hepatic cysts. These are stable from previous exams. The gallbladder has been surgically removed. Pancreas: Unremarkable. No pancreatic ductal dilatation or surrounding inflammatory changes. Spleen: Normal in size without focal abnormality. Adrenals/Urinary Tract: The adrenal glands are within normal limits. Kidneys are well visualized bilaterally without evidence of renal mass or calcification. No obstructive changes are seen. The bladder is well distended. Stomach/Bowel: Scattered diverticular changes are noted throughout the colon. The appendix is well visualized and within normal limits. No obstructive changes are seen. No inflammatory changes are identified. Lymphatic: No significant lymphadenopathy is identified. Reproductive: Prostate is unremarkable. Other: No abdominal wall hernia or abnormality. No abdominopelvic ascites. Musculoskeletal: No acute or significant osseous findings. Review of the MIP images confirms the above findings. IMPRESSION: CTA of the chest: No evidence of aortic dissection or aneurysmal dilatation. No definitive pulmonary emboli are identified. Stable mediastinal and hilar lymph nodes likely reactive in nature. Mild changes of bronchitis are noted as well. 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 CTA of the abdomen and pelvis. No aneurysmal dilatation or dissection is noted. Scattered chronic changes similar to that noted on previous exams. Electronically Signed   By: Alcide CleverMark  Lukens M.D.   On: 03/17/2016 08:33    ASSESSMENT AND PLAN  1. ICD system infection - s/p extraction. He is stable hemodynamically. His pocket has a small amount of drainage. I removed packing material this morning. 2. 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 3. Difficulty with urination - he had a bladder scan with 600 cc of urine. He may require a foley catheter 4. Chronic systolic heart failure - his device is out. He will require a new device. Timing in part dependent on ID service rec's. 5.  Ventricular tachycardia storm, ? 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

## 2016-03-26 DIAGNOSIS — I442 Atrioventricular block, complete: Secondary | ICD-10-CM

## 2016-03-26 DIAGNOSIS — I9589 Other hypotension: Secondary | ICD-10-CM

## 2016-03-26 DIAGNOSIS — Z736 Limitation of activities due to disability: Secondary | ICD-10-CM

## 2016-03-26 LAB — COMPREHENSIVE METABOLIC PANEL
ALBUMIN: 2.9 g/dL — AB (ref 3.5–5.0)
ALT: 19 U/L (ref 17–63)
ANION GAP: 6 (ref 5–15)
AST: 16 U/L (ref 15–41)
Alkaline Phosphatase: 41 U/L (ref 38–126)
BUN: 18 mg/dL (ref 6–20)
CO2: 30 mmol/L (ref 22–32)
Calcium: 8.7 mg/dL — ABNORMAL LOW (ref 8.9–10.3)
Chloride: 99 mmol/L — ABNORMAL LOW (ref 101–111)
Creatinine, Ser: 1.16 mg/dL (ref 0.61–1.24)
GFR calc Af Amer: >60 mL/min (ref >60)
Glucose, Bld: 106 mg/dL — ABNORMAL HIGH (ref 65–99)
POTASSIUM: 4.2 mmol/L (ref 3.5–5.1)
Sodium: 135 mmol/L (ref 135–145)
TOTAL PROTEIN: 6.1 g/dL — AB (ref 6.5–8.1)
Total Bilirubin: 0.6 mg/dL (ref 0.3–1.2)

## 2016-03-26 LAB — CBC WITH DIFFERENTIAL/PLATELET
BASOS ABS: 0 10*3/uL (ref 0.0–0.1)
Basophils Relative: 0 %
Eosinophils Absolute: 0.5 10*3/uL (ref 0.0–0.7)
Eosinophils Relative: 8 %
HEMATOCRIT: 29.6 % — AB (ref 39.0–52.0)
HEMOGLOBIN: 9.7 g/dL — AB (ref 13.0–17.0)
LYMPHS PCT: 22 %
Lymphs Abs: 1.5 10*3/uL (ref 0.7–4.0)
MCH: 30.3 pg (ref 26.0–34.0)
MCHC: 32.8 g/dL (ref 30.0–36.0)
MCV: 92.5 fL (ref 78.0–100.0)
Monocytes Absolute: 0.8 10*3/uL (ref 0.1–1.0)
Monocytes Relative: 11 %
NEUTROS ABS: 4 10*3/uL (ref 1.7–7.7)
Neutrophils Relative %: 59 %
Platelets: 231 10*3/uL (ref 150–400)
RBC: 3.2 MIL/uL — AB (ref 4.22–5.81)
RDW: 14.7 % (ref 11.5–15.5)
WBC: 6.7 10*3/uL (ref 4.0–10.5)

## 2016-03-26 LAB — URINE CULTURE: CULTURE: NO GROWTH

## 2016-03-26 LAB — COOXEMETRY PANEL
Carboxyhemoglobin: 1.7 % — ABNORMAL HIGH (ref 0.5–1.5)
Methemoglobin: 1 % (ref 0.0–1.5)
O2 Saturation: 98.8 %
Total hemoglobin: 10.2 g/dL — ABNORMAL LOW (ref 12.0–16.0)

## 2016-03-26 LAB — MAGNESIUM: Magnesium: 2 mg/dL (ref 1.7–2.4)

## 2016-03-26 MED ORDER — SODIUM CHLORIDE 0.9 % IR SOLN
80.0000 mg | Status: AC
  Administered 2016-03-27: 80 mg
  Filled 2016-03-26: qty 2

## 2016-03-26 MED ORDER — CHLORHEXIDINE GLUCONATE 4 % EX LIQD
60.0000 mL | Freq: Once | CUTANEOUS | Status: AC
  Administered 2016-03-26: 4 via TOPICAL
  Filled 2016-03-26: qty 60

## 2016-03-26 MED ORDER — CEFAZOLIN SODIUM-DEXTROSE 2-4 GM/100ML-% IV SOLN
2.0000 g | INTRAVENOUS | Status: AC
  Administered 2016-03-27: 2 g via INTRAVENOUS
  Filled 2016-03-26: qty 100

## 2016-03-26 MED ORDER — SODIUM CHLORIDE 0.9 % IV SOLN
INTRAVENOUS | Status: DC
  Administered 2016-03-27: 06:00:00 via INTRAVENOUS

## 2016-03-26 MED ORDER — ENSURE ENLIVE PO LIQD
237.0000 mL | Freq: Two times a day (BID) | ORAL | Status: DC
  Administered 2016-03-26 – 2016-03-28 (×3): 237 mL via ORAL

## 2016-03-26 MED ORDER — FUROSEMIDE 40 MG PO TABS
40.0000 mg | ORAL_TABLET | Freq: Every day | ORAL | Status: DC
  Administered 2016-03-26 – 2016-03-30 (×5): 40 mg via ORAL
  Filled 2016-03-26 (×5): qty 1

## 2016-03-26 MED ORDER — CHLORHEXIDINE GLUCONATE 4 % EX LIQD
60.0000 mL | Freq: Once | CUTANEOUS | Status: AC
  Administered 2016-03-27: 4 via TOPICAL
  Filled 2016-03-26: qty 60

## 2016-03-26 NOTE — Progress Notes (Signed)
CARDIAC REHAB PHASE I   PRE:  Rate/Rhythm: 80 paced    BP: lying 91/54, standing 99/70    SaO2: 100 RA  MODE:  Ambulation: 330 ft   POST:  Rate/Rhythm: 80 pacing    BP: sitting 118/82     SaO2: 98 RA  Pt continues to c/o back pain. Needed assist to get to EOB, relying on bilateral arms to move hips. Pt likes to move slowly to decrease back pain. Once up and walking, he was steady with RW. Used RW, gait belt, assist x2 for safety. Rest x3 due to fatigue. Pt actually more upright toward end of walk. Return to bed. VSS.  [id number]   [name] MassonRandi [name] [name] CES, ACSM 03/26/2016 2:46 PM

## 2016-03-26 NOTE — Progress Notes (Signed)
Transferred -in from 2south by whellchair awake and alert.

## 2016-03-26 NOTE — Progress Notes (Addendum)
Nutrition Follow Up   DOCUMENTATION CODES:   Not applicable  INTERVENTION:    Ensure Enlive po BID, each supplement provides 350 kcal and 20 grams of protein  NUTRITION DIAGNOSIS:   Increased nutrient needs related to  (post-op healing) as evidenced by estimated needs, ongoing  GOAL:   Patient will meet greater than or equal to 90% of their needs, progressing   MONITOR:   PO intake, Labs, Weight trends, I & O's  ASSESSMENT:   61 yo Male admitted with SOB with weight gain of 8 pounds with acute onchronic CHF. ECHO 11/2015 with LVEF 15% 2/2 ICM. Pt being evaluated for LVAD. Pt also with bactermia, TEE without vegetation. Pt with additional hx of smoking CAD s/p multiple PCIs, MI, TIA, depression.   Pt s/p procedure 1/29: EXTRACTION OF BIVENTRICULAR ICD SYSTEM INSERTION OF TEMPORARY PERMANENT TRANSVENOUS PACEMAKER  Transferred from 3W-Cardiology to 2S-SICU post op. PO intake variable at 50-100% per flowsheet records. Would benefit from addition of oral nutrition supplements. Labs and medications reviewed. CBG 88.  Diet Order:  Diet Heart Room service appropriate? Yes; Fluid consistency: Thin  Skin:  Reviewed, no issues  Last BM:  1/29  Height:   Ht Readings from Last 1 Encounters:  03/26/16 5\' 11"  (1.803 m)    Weight:   Wt Readings from Last 1 Encounters:  03/26/16 202 lb 9.6 oz (91.9 kg)   BMI:  Body mass index is 28.26 kg/m.  Estimated Nutritional Needs:   Kcal:  2100-2300 kcals  Protein:  110-130 g   Fluid:  >/= 1.5 L  EDUCATION NEEDS:   No education needs identified at this time  Maureen ChattersKatie Cori Justus, RD, LDN Pager #: [phone] After-Hours Pager #: [phone]

## 2016-03-26 NOTE — Progress Notes (Signed)
Phlebotomy called, notified of abnormal coox results d/t possible accidental arterial sample instead of venous sample. They came and recollected sample; results still not what one would expect from a venous sample.

## 2016-03-26 NOTE — Progress Notes (Signed)
Patient ID: [name], male   DOB: Oct 30, 1955, 61 y.o.   MRN: [id number]     Advanced Heart Failure Rounding Note  Primary Cardiologist: Dr Jens Som Primary HF: Dr. Shirlee Latch   Subjective:    Admitted from ED 03/17/16 with recurrent low output HF with NYHA IIIb-IV symptoms.   He was on milrinone initially with diuresis, now off.  RHC 1/26 looked good.   BCx 03/18/16 2/2 coag negative staph. ICD pocked noted to have serous drainage and exposed ICD. CT abdomen/pelvis did not show evidence for discitis or acute MSK changes Afebrile. WBCs WNL currently.   S/p ICD extraction 03/23/16 with placement of Temporary permanent pacemaker with previous pacer dependency due to CHB.   TEE on 1/26 with no evidence of vegetation.   Meds held with orthostasis 03/24/16. Given gentle IVF.   Feeling OK this am. Spoke with Case Management. He is willing to consider short term SNF with IV ABX, deconditioning, and doesn't have 24 hr support at home.  Anxious over occasional NSVT.   Orthostasis resolved. Pressures stable though still soft.   Weight shows down 10 lbs. Inaccurate. Creatinine stable.    RHC Procedural Findings (1/26, off milrinone): Hemodynamics (mmHg) RA mean 1 RV 36/8 PA 36/11, mean 21 PCWP mean 7 Oxygen saturations: PA 62% AO 96% Cardiac Output (Fick) 6.58  Cardiac Index (Fick) 3.06 Cardiac Output (Thermo) 6.62 Cardiac Index (Thermo) 3.08  Objective:   Weight Range: 202 lb 9.6 oz (91.9 kg) Body mass index is 28.26 kg/m.   Vital Signs:   Temp:  [97.4 F (36.3 C)-98.2 F (36.8 C)] 97.5 F (36.4 C) (02/01 0744) Pulse Rate:  [75-84] 80 (02/01 1000) Resp:  [9-22] 10 (02/01 1000) BP: (75-120)/(51-71) 89/60 (02/01 1000) SpO2:  [81 %-100 %] 97 % (02/01 1000) Weight:  [202 lb 9.6 oz (91.9 kg)] 202 lb 9.6 oz (91.9 kg) (02/01 0500) Last BM Date: 03/23/16  Weight change: Filed Weights   03/24/16 0800 03/25/16 0500 03/26/16 0500  Weight: 211 lb 3.2 oz (95.8 kg) 212 lb 1.3 oz (96.2  kg) 202 lb 9.6 oz (91.9 kg)    Intake/Output:   Intake/Output Summary (Last 24 hours) at 03/26/16 1110 Last data filed at 03/26/16 0800  Gross per 24 hour  Intake          1637.13 ml  Output             2255 ml  Net          -617.87 ml     Physical Exam: General: Lying in bed. NAD.       HEENT: Normal Neck: supple. JVP 8-9 cm. Carotids 2+ bilat; no bruits. No thyromegaly or nodule noted.  Cor: PMI nondisplaced. Regular. No M/G/R   Left chest dressing C/D/I s/p ICD extraction. R sided pacer site stable.  Lungs: Clear, normal effort Abdomen: soft, NT, ND, no HSM. No bruits or masses. +BS  Extremities: no cyanosis, clubbing, rash. No edema.    Neuro: alert & oriented x 3, cranial nerves grossly intact. moves all 4 extremities w/o difficulty. Affect pleasant.  Telemetry: Reviewed, A sensed V paced. Occasional NSVT  Labs: CBC  Recent Labs  03/25/16 0640 03/26/16 0328  WBC 10.2 6.7  NEUTROABS  --  4.0  HGB 10.2* 9.7*  HCT 30.8* 29.6*  MCV 92.8 92.5  PLT 236 231   Basic Metabolic Panel  Recent Labs  03/25/16 0357 03/26/16 0328  NA 131* 135  K 3.8 4.2  CL 96* 99*  CO2 25 30  GLUCOSE 86 106*  BUN 20 18  CREATININE 1.19 1.16  CALCIUM 8.5* 8.7*  MG 1.7  --    Liver Function Tests  Recent Labs  03/26/16 0328  AST 16  ALT 19  ALKPHOS 41  BILITOT 0.6  PROT 6.1*  ALBUMIN 2.9*   No results for input(s): LIPASE, AMYLASE in the last 72 hours. Cardiac Enzymes No results for input(s): CKTOTAL, CKMB, CKMBINDEX, TROPONINI in the last 72 hours.  BNP: BNP (last 3 results)  Recent Labs  01/13/16 1008 01/27/16 1155 03/17/16 0311  BNP 256.0* 595.7* 894.5*    ProBNP (last 3 results) No results for input(s): PROBNP in the last 8760 hours.   D-Dimer No results for input(s): DDIMER in the last 72 hours. Hemoglobin A1C No results for input(s): HGBA1C in the last 72 hours. Fasting Lipid Panel No results for input(s): CHOL, HDL, LDLCALC, TRIG, CHOLHDL,  LDLDIRECT in the last 72 hours. Thyroid Function Tests No results for input(s): TSH, T4TOTAL, T3FREE, THYROIDAB in the last 72 hours.  Invalid input(s): FREET3  Other results:     Imaging/Studies:  No results found.    Medications:     Scheduled Medications: . amiodarone  200 mg Oral Daily  . aspirin EC  81 mg Oral Daily  . atorvastatin  40 mg Oral q1800  .  ceFAZolin (ANCEF) IV  2 g Intravenous Q8H  . clopidogrel  75 mg Oral Daily  . digoxin  0.125 mg Oral QODAY  . docusate sodium  100 mg Oral BID  . famotidine  20 mg Oral BID  . heparin  5,000 Units Subcutaneous Q8H  . lidocaine  1 patch Transdermal Q24H  . magnesium oxide  400 mg Oral Daily  . mexiletine  200 mg Oral Q12H  . nicotine  21 mg Transdermal Daily  . PARoxetine  10 mg Oral Daily    Infusions: . lactated ringers 10 mL/hr at 03/26/16 0700    PRN Medications: acetaminophen, albuterol, ALPRAZolam, cyclobenzaprine, hydrOXYzine, nitroGLYCERIN, ondansetron (ZOFRAN) IV, oxyCODONE-acetaminophen, sodium chloride flush, traMADol   Assessment/Plan   1. Acute on chronic systolic CHF: Echo 11/2015 LVEF 15%, ischemic cardiomyopathy.  Initially on milrinone and diuresed, now off milrinone and back on po Lasix.  See RHC above => filling pressures optimized and CO preserved off milrinone.  Initial presentation may have been related to Coag negative Staph. Have begun LVAD work up. TEE 1/26; LVEF 15% mod-severe MR. He developed orthostatic hypotension after dual chamber ICD removed and temp-perm pacer placed, possible that constant RV pacing has caused some worsening of his cardiac output.  Mild dyspnea with walk today, mild JVD.  - Restart home Lasix 40 mg po daily.    - Pressures remain soft but stable off nor-epi. Continue to hold bisoprolol, losartan, spironolactone for now. Will add back gradually as tolerated => probably after device re-implantation tomorrow.   - Continue digoxin 0.125 mg daily. Level 0.2 on admit.    - Possible replacement of device with His bundle pacing.  - On going work up for LVAD. May eventually be transplant candidate. Just stopped smoking PTA. VAD coordinator and Dr. Laneta SimmersBartle have seen. HFSW to plan family meeting. 2. CAD: Recent LHC in 12/17 with stable coronary disease  - Continue statin, ASA, Plavix.   3. Ascending aortic aneurysm: 4.1 cm 8/17. No evidence of dissection on CTA 03/17/16.  4. VT storm in 12/17: Continue amiodarone and Mexiletine.  - EP following with lead extraction.  - Short run  NSVT yesterday.   - Now s/p ICD extraction and placement of temporary-permanent pacemaker.  ICD to be re-implanted 03/27/16.  - Recheck Mg. May need further supp.  5. Low back pain, Chronic - Ortho has seen. Suspect MSK.  No discitis/Abscess on CT abdomen/pelvis.  - Continue flexeril and tramadol prn.  6. 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  - s/p ICD extraction due to infected pocked.  - Had temporary-permanent device placed with RV pacing.  To have device replaced tomorrow if cultures remain negative, hopefully device with His bundle pacing.  7. ID: Blood cultures (done for back pain, ?discitis given concerns about ICD pocket) returned 2/2 coag negative Staph.  ICD eroded with drainage from site.  - s/p Extraction 03/23/16 as above.  - Continue cefazolin IV per ID. Will be on until 04/19/16 (will need PICC) Blood cultures re-drawn 1/25. NGTD.  - TEE 1/26 no vegetation  - Per ID OK to re-implant CRT-D as soon as 03/27/16. - No change to current plan. Will need PICC line prior to discharge.   Transfer to [employer].   Graciella Freer, PA-C  03/26/2016 11:10 AM   Advanced Heart Failure Team Pager [phone] (M-F; 7a - 4p)  Please contact [facility] for night-coverage after hours (4p -7a ) and weekends on [email or web address]  Patient seen with PA, agree with the above note.  BP better.  Mild volume overload.   - Restart Lasix 40 mg daily.  -  Continue to hold other BP-active meds.   Tentative plan for replacement of ICD on Friday, given dependence on pacing, plan for possible His bundle pacing (concern with pro-arrhythmia of LV pacing).    Marca Ancona 03/26/2016 12:06 PM

## 2016-03-26 NOTE — Progress Notes (Signed)
Patient Name: [name] LuriaLewis R [name] Date of Encounter: 03/26/2016     Active Problems:   Acute on chronic systolic congestive heart failure (HCC)   Bacteremia    SUBJECTIVE  S/p ICD system extraction. Stable. C/o back pain. No chest pain.  Some anxiety w recurrent Nonsustained VT  SOB   CURRENT MEDS . amiodarone  200 mg Oral Daily  . aspirin EC  81 mg Oral Daily  . atorvastatin  40 mg Oral q1800  .  ceFAZolin (ANCEF) IV  2 g Intravenous Q8H  . clopidogrel  75 mg Oral Daily  . digoxin  0.125 mg Oral QODAY  . docusate sodium  100 mg Oral BID  . famotidine  20 mg Oral BID  . heparin  5,000 Units Subcutaneous Q8H  . lidocaine  1 patch Transdermal Q24H  . magnesium oxide  400 mg Oral Daily  . mexiletine  200 mg Oral Q12H  . nicotine  21 mg Transdermal Daily  . PARoxetine  10 mg Oral Daily    OBJECTIVE  Vitals:   03/26/16 0600 03/26/16 0645 03/26/16 0700 03/26/16 0744  BP: (!) 91/52 109/65 109/66   Pulse: 80 81 80   Resp: 12 18 15    Temp:    97.5 F (36.4 C)  TempSrc:    Oral  SpO2: 93% 98% 100%   Weight:      Height:        Intake/Output Summary (Last 24 hours) at 03/26/16 0819 Last data filed at 03/26/16 0700  Gross per 24 hour  Intake          1678.53 ml  Output             2255 ml  Net          -576.47 ml   Filed Weights   03/24/16 0800 03/25/16 0500 03/26/16 0500  Weight: 211 lb 3.2 oz (95.8 kg) 212 lb 1.3 oz (96.2 kg) 202 lb 9.6 oz (91.9 kg)    PHYSICAL EXAM  General: Pleasant, NAD. Neuro: Alert and oriented X 3. Moves all extremities spontaneously. Psych: Normal affect. HEENT:  Normal  Neck: Supple without bruits or JVD. Indwelling right IJ PPM. Lungs:  Resp regular and unlabored, CTA. Heart: RRR no s3, s4, or murmurs. ICD pocket - with a small amount of serosanguinous drainage Abdomen: Soft, non-tender, non-distended, BS + x 4.  Extremities: No clubbing, cyanosis or edema. DP/PT/Radials 2+ and equal bilaterally.  Accessory Clinical  Findings  CBC  Recent Labs  03/25/16 0640 03/26/16 0328  WBC 10.2 6.7  NEUTROABS  --  4.0  HGB 10.2* 9.7*  HCT 30.8* 29.6*  MCV 92.8 92.5  PLT 236 231   Basic Metabolic Panel  Recent Labs  03/25/16 0357 03/26/16 0328  NA 131* 135  K 3.8 4.2  CL 96* 99*  CO2 25 30  GLUCOSE 86 106*  BUN 20 18  CREATININE 1.19 1.16  CALCIUM 8.5* 8.7*  MG 1.7  --    Liver Function Tests  Recent Labs  03/26/16 0328  AST 16  ALT 19  ALKPHOS 41  BILITOT 0.6  PROT 6.1*  ALBUMIN 2.9*   No results for input(s): LIPASE, AMYLASE in the last 72 hours. Cardiac Enzymes No results for input(s): CKTOTAL, CKMB, CKMBINDEX, TROPONINI in the last 72 hours. BNP Invalid input(s): POCBNP D-Dimer No results for input(s): DDIMER in the last 72 hours. Hemoglobin A1C No results for input(s): HGBA1C in the last 72 hours. Fasting Lipid Panel No  results for input(s): CHOL, HDL, LDLCALC, TRIG, CHOLHDL, LDLDIRECT in the last 72 hours. Thyroid Function Tests No results for input(s): TSH, T4TOTAL, T3FREE, THYROIDAB in the last 72 hours.  Invalid input(s): FREET3  Telemetry Personally reviewed   Ventricular paced w VT NS  Radiology/Studies   Chest Xray personally reviewed  No PICC   Dg Chest 2 View  Result Date: 03/24/2016 CLINICAL DATA:  Pacer removal EXAM: CHEST  2 VIEW COMPARISON:  7 days ago FINDINGS: Single chamber pacer from the right with lead in the expected location of the right ventricle. Left-sided pacer has been removed. Cardiopericardial size is stable. Cardiomegaly and coronary stent. Low volume chest without pneumothorax. Chest wall emphysema on the left, presumably related to dissection of generator pack. IMPRESSION: 1. Stable cardiopericardial size after left-sided pacer removal. Left chest wall gas, no pneumothorax. 2. New right-sided pacer with right ventricular lead. 3. Low volume chest. Electronically Signed   By: Marnee Spring M.D.   On: 03/24/2016 08:39   Dg Chest 2  View  Result Date: 03/17/2016 CLINICAL DATA:  Chest and lower back pain. History of 3 myocardial infarctions with stents. EXAM: CHEST  2 VIEW COMPARISON:  01/29/2016 CXR FINDINGS: Left-sided AICD device with coronary sinus, right atrial and right ventricular leads present. Cardiomegaly is identified with coronary arterial stenting. No aortic aneurysm. Chronic mild interstitial prominence without pneumonic consolidation, effusion or pneumothorax. Mild apical pleuroparenchymal thickening and scarring. No acute osseous abnormality. IMPRESSION: Cardiomegaly without acute pulmonary disease. Electronically Signed   By: Tollie Eth M.D.   On: 03/17/2016 03:53   Dg Lumbar Spine 2-3 Views  Result Date: 03/18/2016 CLINICAL DATA:  Acute low back pain without known injury. EXAM: LUMBAR SPINE - 2-3 VIEW COMPARISON:  CT scan of December 16, 2015. FINDINGS: No fracture or spondylolisthesis is noted. Atherosclerosis of abdominal aorta is noted. Mild degenerative disc disease is noted at L3-4. Remaining disc spaces appear intact. IMPRESSION: Mild degenerative disc disease at L3-4. No acute abnormality seen in the lumbar spine. Aortic atherosclerosis. Electronically Signed   By: Lupita Raider, M.D.   On: 03/18/2016 11:36   Dg Chest Port 1 View  Result Date: 03/17/2016 CLINICAL DATA:  PICC line placement EXAM: PORTABLE CHEST 1 VIEW COMPARISON:  03/17/2016 FINDINGS: The visualized bony structures of the thorax are intact. 2054 hours. Right PICC line tip overlies the distal SVC level. The cardio pericardial silhouette is enlarged. The lungs are clear wiithout focal pneumonia, edema, pneumothorax or pleural effusion. Left pacer/AICD again noted. IMPRESSION: 1. Right PICC line tip overlies the distal SVC. 2. Cardiomegaly. Electronically Signed   By: Kennith Center M.D.   On: 03/17/2016 21:05   Ct Angio Chest/abd/pel For Dissection W And/or Wo Contrast  Result Date: 03/17/2016 CLINICAL DATA:  Right-sided chest pain EXAM: CT  ANGIOGRAPHY CHEST, ABDOMEN AND PELVIS TECHNIQUE: Multidetector CT imaging through the chest, abdomen and pelvis was performed using the standard protocol during bolus administration of intravenous contrast. Multiplanar reconstructed images and MIPs were obtained and reviewed to evaluate the vascular anatomy. CONTRAST:  100 mL Isovue 370. COMPARISON:  Chest x-ray from earlier in the same day, 12/16/2015, 10/18/2015 FINDINGS: CTA CHEST FINDINGS Cardiovascular: The thoracic aorta shows mild atherosclerotic calcifications without aneurysmal dilatation or dissection. Pulmonary artery shows a normal branching pattern without evidence of pulmonary embolism. Coronary calcifications and prior coronary stenting is noted. A defibrillator is again noted. Cardiac structures are mildly enlarged but stable. Multiple chest wall venous collaterals are identified likely related to stenosis of  the left subclavian vein from the defibrillator. Mediastinum/Nodes: Thoracic inlet is within normal limits. Stable mediastinal adenopathy is noted in the right peritracheal and subcarinal region with the largest of these in the right peritracheal region measuring 16 mm in short axis which is stable from the prior exam. Scattered small hilar lymph nodes are noted also stable from the previous study. The esophagus as visualize is within normal limits. Lungs/Pleura: Mild emphysematous changes are noted. No focal infiltrate or sizable effusion is seen. Mild peribronchial thickening is noted which is stable from the prior exam likely of a chronic nature. No acute abnormality is seen. Musculoskeletal: Degenerative changes of the thoracic spine are noted. No acute bony abnormality is seen. Review of the MIP images confirms the above findings. CTA ABDOMEN AND PELVIS FINDINGS VASCULAR Aorta: The abdominal aorta demonstrates atherosclerotic calcifications without aneurysmal dilatation or evidence of dissection. Celiac: Mild atherosclerotic calcifications  are noted at the origin although the celiac axis is widely patent. SMA: Mild atherosclerotic changes are noted at the origin although the superior mesenteric artery is patent. Renals: Single renal arteries are identified bilaterally with mild atherosclerotic calcifications. No focal hemodynamically significant stenosis is noted. IMA: Widely patent Iliacs: Mild atherosclerotic changes are seen without aneurysmal dilatation or dissection. Veins: Although not timed for venous evaluation no definitive abnormality is seen. Review of the MIP images confirms the above findings. NON-VASCULAR Hepatobiliary: Multiple hypodensities are again identified within the liver consistent with hepatic cysts. These are stable from previous exams. The gallbladder has been surgically removed. Pancreas: Unremarkable. No pancreatic ductal dilatation or surrounding inflammatory changes. Spleen: Normal in size without focal abnormality. Adrenals/Urinary Tract: The adrenal glands are within normal limits. Kidneys are well visualized bilaterally without evidence of renal mass or calcification. No obstructive changes are seen. The bladder is well distended. Stomach/Bowel: Scattered diverticular changes are noted throughout the colon. The appendix is well visualized and within normal limits. No obstructive changes are seen. No inflammatory changes are identified. Lymphatic: No significant lymphadenopathy is identified. Reproductive: Prostate is unremarkable. Other: No abdominal wall hernia or abnormality. No abdominopelvic ascites. Musculoskeletal: No acute or significant osseous findings. Review of the MIP images confirms the above findings. IMPRESSION: CTA of the chest: No evidence of aortic dissection or aneurysmal dilatation. No definitive pulmonary emboli are identified. Stable mediastinal and hilar lymph nodes likely reactive in nature. Mild changes of bronchitis are noted as well. Changes of left subclavian venous stenosis likely related  to the previously placed defibrillator. CTA of the abdomen and pelvis. No aneurysmal dilatation or dissection is noted. Scattered chronic changes similar to that noted on previous exams. Electronically Signed   By: Alcide Clever M.D.   On: 03/17/2016 08:33    ASSESSMENT AND PLAN  1. ICD system infection - s/p extraction. He is stable hemodynamically. His pocket has a small amount of drainage. I removed packing material this morning. 2. 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 3. Difficulty with urination - he had a bladder scan with 600 cc of urine. He may require a foley catheter 4. Chronic systolic heart failure - his device is out. He will require a new device. Timing in part dependent on ID service rec's. 5.  Ventricular tachycardia storm, ? 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

## 2016-03-26 NOTE — Clinical Social Work Placement (Signed)
   CLINICAL SOCIAL WORK PLACEMENT  NOTE  Date:  03/26/2016  Patient Details  Name: [name] MRN: [id number] Date of Birth: 02/03/1956  Clinical Social Work is seeking post-discharge placement for this patient at the Skilled  Nursing Facility level of care (*CSW will initial, date and re-position this form in  chart as items are completed):  Yes   Patient/family provided with [place] Clinical Social Work Department's list of facilities offering this level of care within the geographic area requested by the patient (or if unable, by the patient's family).  Yes   Patient/family informed of their freedom to choose among providers that offer the needed level of care, that participate in Medicare, Medicaid or managed care program needed by the patient, have an available bed and are willing to accept the patient.  Yes   Patient/family informed of 's ownership interest [facility] and [facility], as well as of the fact that they are under no obligation to receive care at these facilities.  PASRR submitted to EDS on 03/26/16     PASRR number received on 03/26/16     Existing PASRR number confirmed on       FL2 transmitted to all facilities in geographic area requested by pt/family on 03/26/16     FL2 transmitted to all facilities within larger geographic area on       Patient informed that his/her managed care company has contracts with or will negotiate with certain facilities, including the following:            Patient/family informed of bed offers received.  Patient chooses bed at       Physician recommends and patient chooses bed at      Patient to be transferred to   on  .  Patient to be transferred to facility by       Patient family notified on   of transfer.  Name of family member notified:        PHYSICIAN Please sign FL2     Additional Comment:    _______________________________________________ Raye Sorrowoble, Liberti Appleton N, LCSW 03/26/2016, 3:45  PM

## 2016-03-26 NOTE — Progress Notes (Signed)
Physical Therapy Treatment Patient Details Name: [name] LuriaLewis R Sherrow MRN: [id number] DOB: 11/25/1955 Today's Date: 03/26/2016    History of Present Illness Pt is a 61 y/o male admitted secondary to worsening SOB, acute CHF exacerbation. Pt with infected pacemaker s/p excision with temporary pacer placed. Planned AICD implantation 03/27/16. 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 s/p cardioversion 03/20/16.     PT Comments    Pt very pleasant and willing to mobilize. Pt stating that he has pain from bil SI joint malalignment and performed gentle mobilization in sidelying bil to reduce pain. Pt with continued low BP with mobility but MAP above 60 throughout and no presyncope with pt able to ambulate around unit today. Pt educated for mobility and HEP. Will continue to follow post operative to determine best D/C plan. Pt concerned about lack of assist at home and potential need for additional rehab prior to home.   95/79 supine 85/50 (61) sitting 83/58 (64) standing 123/62 return to supine HR 80 maintained throughout   Follow Up Recommendations  SNF;Supervision - Intermittent, pt reports lack of assist at home, will need IV antibiotics and interested in additional help at D/C     Equipment Recommendations  Rolling walker with 5" wheels    Recommendations for Other Services       Precautions / Restrictions Precautions Precautions: ICD/Pacemaker Restrictions Other Position/Activity Restrictions: bilat UE with limited lifting and pushing d/t pacemaker precautions    Mobility  Bed Mobility Overal bed mobility: Needs Assistance Bed Mobility: Rolling;Sidelying to Sit Rolling: Min guard Sidelying to sit: Min assist       General bed mobility comments: cues for safety and assist to elevate trunk from bed to prevent pushing. Pt rolled bil x 2 for SI adjustment  Transfers Overall transfer level: Needs assistance   Transfers:  Sit to/from Stand Sit to Stand: Min assist         General transfer comment: assist to rise and prevent pushing, cues for hand placement  Ambulation/Gait Ambulation/Gait assistance: Min guard Ambulation Distance (Feet): 300 Feet Assistive device: Rolling walker (2 wheeled) Gait Pattern/deviations: Step-through pattern;Decreased stride length   Gait velocity interpretation: Below normal speed for age/gender General Gait Details: cues for upright posture to decrease bil UE weight bearing   Stairs            Wheelchair Mobility    Modified Rankin (Stroke Patients Only)       Balance                                    Cognition Arousal/Alertness: Awake/alert Behavior During Therapy: WFL for tasks assessed/performed Overall Cognitive Status: Within Functional Limits for tasks assessed                      Exercises General Exercises - Lower Extremity Heel Slides: AROM;Both;Supine;10 reps    General Comments        Pertinent Vitals/Pain Pain Assessment: No/denies pain    Home Living                      Prior Function            PT Goals (current goals can now be found in the care plan section) Progress towards PT goals: Progressing toward goals    Frequency  PT Plan Discharge plan needs to be updated    Co-evaluation             End of Session Equipment Utilized During Treatment: Gait belt Activity Tolerance: Patient tolerated treatment well Patient left: in bed;with call bell/phone within reach     Time: 1127-1152 PT Time Calculation (min) (ACUTE ONLY): 25 min  Charges:  $Gait Training: 8-22 mins $Therapeutic Activity: 8-22 mins                    G Codes:      Elyzabeth Goatley B Nataliah Hatlestad 04/03/16, 1:50 PM Delaney Meigs, PT [phone]

## 2016-03-26 NOTE — Care Management Note (Addendum)
Case Management Note Original Note initiated by Mable FillBrenda Bigelow 03/23/16  Patient Details  Name: [name] LuriaLewis R Vandagriff MRN: [id number] Date of Birth: 04/19/1955  Subjective/Objective:  Pt presented for worsening SOB and weight gain. Initiated on IV Lasix and IV Milrinone gtt. Per MD notes plan to wean Milrinone and plan for RHC on 03-20-16. Pt is from home with sister in [place].                     Action/Plan: LVAD workup in process. CM will continue to monitor for home needs.   Expected Discharge Date:                  Expected Discharge Plan:  Home w Home Health Services  In-House Referral:  NA  Discharge planning Services  CM Consult  Post Acute Care Choice:  Durable Medical Equipment Choice offered to:  Patient  DME Arranged:  Walker rolling, IV pump/equipment DME Agency:  [facility]  [facility]  [facility]  [facility]  Status of Service:  In process, will continue to follow  If discussed at Long Length of Stay Meetings, dates discussed:  03-24-16 Additional Comments: 03/26/2016  PT will reassess pt post new ICD placement tomorrow.  CM will continue to follow for discharge needs  CM discussed recommendation of 24 hour supervision directly with pt - pt is unable to arrange this and he confirmed that he is not at baseline with mobility.  CM spoke with pts daughter April and concerns were raised with pt returning home in current condition with the additional complexity of IV antibiotics.  CM spoke with Greig CastillaAndrew from HF team - pt will no longer receive Life Vest as new ICD will be implanted tomorrow.   CM contacted PT to inform that pt does not have recommended supervision at home -  CSW consulted for tentative SNF referral.  CM contacted Life Vest liaison and informed that life vest is no longer needed.  If pt is not deemed appropriate for SNF - [facility]  03/25/16 CM confirmed with Life Vest liaison that pt has been  approved for vest, agency request call back closer to discharge date to schedule fitting.  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 Awaiting Insurance Authorization at this time. ICD pocket infection - for extraction today.  CM will continue to monitor.  [name], Velencia Lenart S, RN 03/26/2016, 10:54 AM

## 2016-03-26 NOTE — NC FL2 (Signed)
[place] MEDICAID FL2 LEVEL OF CARE SCREENING TOOL     IDENTIFICATION  Patient Name: [name] Birthdate: [birth year] Sex: male Admission Date (Current Location): 03/17/2016  [facility] and [place] Number:  Producer, television/film/video and Address:  [facility], [address], [place], [place] [address]      Provider Number: [id number]  Attending Physician Name and Address:  Kerin Perna, MD  Relative Name and Phone Number:       Current Level of Care: Hospital Recommended Level of Care: Skilled Nursing Facility Prior Approval Number:    Date Approved/Denied:   PASRR Number:   [id number]  Discharge Plan: SNF    Current Diagnoses: Patient Active Problem List   Diagnosis Date Noted  . Bacteremia   . Acute on chronic systolic congestive heart failure (HCC) 03/17/2016  . Pulmonary edema   . VF (ventricular fibrillation) (HCC) 01/27/2016  . Encounter for intubation   . Acute respiratory failure with hypoxia (HCC)   . Hypokalemia   . DOE (dyspnea on exertion) 06/19/2015  . Acute systolic congestive heart failure, NYHA class 3 (HCC)   . ICD (implantable cardioverter-defibrillator) in place   . H/O medication noncompliance   . Acute kidney insufficiency   . Chest pain at rest   . Depression 04/09/2015  . Acute on chronic systolic CHF (congestive heart failure) (HCC) 04/09/2015  . Erectile dysfunction 04/09/2015  . RBBB   . PVC's (premature ventricular contractions)   . Chronic systolic CHF (congestive heart failure), NYHA class 3 (HCC) - low output state 12/12/2014  . Generalized anxiety disorder 12/09/2014  . Major depressive disorder, recurrent episode (HCC) 12/09/2014  . TIA (transient ischemic attack)   . HLD (hyperlipidemia)   . Aphasia 05/04/2014  . Unstable angina (HCC) 06/02/2011  . CAD (coronary artery disease)   . Ischemic cardiomyopathy   . Automatic implantable cardioverter-defibrillator in situ   . TOBACCO ABUSE 02/04/2010  .  VENTRICULAR TACHYCARDIA 08/21/2008  . HYPERLIPIDEMIA-MIXED 06/04/2008  . ANXIETY 06/04/2008  . Essential hypertension 06/04/2008  . Cardiomyopathy, ischemic 06/04/2008  . CHEST PAIN-UNSPECIFIED 06/04/2008    Orientation RESPIRATION BLADDER Height & Weight     Self, Time, Situation, Place  Normal Continent Weight: 202 lb 9.6 oz (91.9 kg) Height:  5\' 11"  (180.3 cm)  BEHAVIORAL SYMPTOMS/MOOD NEUROLOGICAL BOWEL NUTRITION STATUS      Continent Diet (regular/see dc summary)  AMBULATORY STATUS COMMUNICATION OF NEEDS Skin   Supervision (walking 42ft) Verbally Surgical wounds (chest)                       Personal Care Assistance Level of Assistance  Bathing, Feeding, Dressing Bathing Assistance: Limited assistance Feeding assistance: Independent Dressing Assistance: Limited assistance     Functional Limitations Info  Sight, Hearing, Speech Sight Info: Adequate Hearing Info: Adequate Speech Info: Adequate    SPECIAL CARE FACTORS FREQUENCY  PT (By licensed PT), OT (By licensed OT)     PT Frequency: 5x OT Frequency: 5x      Will need IV antibiotics at Discharge. Will have PICC Line for access      Contractures Contractures Info: Not present    Additional Factors Info  Code Status, Allergies Code Status Info: Full Code Allergies Info: NKA           Current Medications (03/26/2016):  This is the current hospital active medication list Current Facility-Administered Medications  Medication Dose Route Frequency Provider Last Rate Last Dose  .  acetaminophen (TYLENOL) tablet 325-650 mg  325-650 mg Oral Q4H PRN Marinus Maw, MD      . albuterol (PROVENTIL) (2.5 MG/3ML) 0.083% nebulizer solution 2.5 mg  2.5 mg Nebulization Q4H PRN Laurey Morale, MD      . ALPRAZolam Prudy Feeler) tablet 0.25 mg  0.25 mg Oral BID PRN Arty Baumgartner, NP   0.25 mg at 03/26/16 0850  . amiodarone (PACERONE) tablet 200 mg  200 mg Oral Daily Graciella Freer, PA-C   200 mg at 03/26/16  1610  . aspirin EC tablet 81 mg  81 mg Oral Daily Graciella Freer, PA-C   81 mg at 03/26/16 9604  . atorvastatin (LIPITOR) tablet 40 mg  40 mg Oral q1800 Mariam Dollar Tillery, PA-C   40 mg at 03/25/16 1813  . ceFAZolin (ANCEF) IVPB 2g/100 mL premix  2 g Intravenous Q8H Gardiner Barefoot, MD   2 g at 03/26/16 1416  . clopidogrel (PLAVIX) tablet 75 mg  75 mg Oral Daily Graciella Freer, PA-C   75 mg at 03/26/16 5409  . cyclobenzaprine (FLEXERIL) tablet 10 mg  10 mg Oral BID PRN Graciella Freer, PA-C   10 mg at 03/26/16 0915  . digoxin (LANOXIN) tablet 0.125 mg  0.125 mg Oral Steffanie Rainwater, PA-C   0.125 mg at 03/25/16 8119  . docusate sodium (COLACE) capsule 100 mg  100 mg Oral BID Azalee Course, PA   100 mg at 03/26/16 0914  . famotidine (PEPCID) tablet 20 mg  20 mg Oral BID Mariam Dollar Tillery, PA-C   20 mg at 03/26/16 1478  . feeding supplement (ENSURE ENLIVE) (ENSURE ENLIVE) liquid 237 mL  237 mL Oral BID BM Kerin Perna, MD   237 mL at 03/26/16 1400  . furosemide (LASIX) tablet 40 mg  40 mg Oral Daily Laurey Morale, MD   40 mg at 03/26/16 1300  . heparin injection 5,000 Units  5,000 Units Subcutaneous Q8H Laurey Morale, MD   5,000 Units at 03/26/16 1310  . hydrOXYzine (ATARAX/VISTARIL) tablet 25 mg  25 mg Oral QHS PRN Laurey Morale, MD   25 mg at 03/24/16 0010  . lactated ringers infusion   Intravenous Continuous Dorris Singh, MD 10 mL/hr at 03/26/16 0700    . lidocaine (LIDODERM) 5 % 1 patch  1 patch Transdermal Q24H Nada Maclachlan, MD   1 patch at 03/25/16 1817  . magnesium oxide (MAG-OX) tablet 400 mg  400 mg Oral Daily Mariam Dollar Tillery, PA-C   400 mg at 03/26/16 0915  . mexiletine (MEXITIL) capsule 200 mg  200 mg Oral Q12H Graciella Freer, PA-C   200 mg at 03/26/16 0915  . nicotine (NICODERM CQ - dosed in mg/24 hours) patch 21 mg  21 mg Transdermal Daily Mariam Dollar Tillery, PA-C   21 mg at 03/18/16 1005  . nitroGLYCERIN (NITROSTAT)  SL tablet 0.4 mg  0.4 mg Sublingual Q5 min PRN Mariam Dollar Tillery, PA-C      . ondansetron [facility]) injection 4 mg  4 mg Intravenous Q6H PRN Laurey Morale, MD      . oxyCODONE-acetaminophen (PERCOCET/ROXICET) 5-325 MG per tablet 1-2 tablet  1-2 tablet Oral Q6H PRN Graciella Freer, PA-C   2 tablet at 03/26/16 2956  . PARoxetine (PAXIL) tablet 10 mg  10 mg Oral Daily Graciella Freer, PA-C   10 mg at 03/26/16 2130  . sodium chloride flush (NS) 0.9 % injection 10-40 mL  10-40 mL Intracatheter PRN Laurey Moralealton S McLean, MD   10 mL at 03/22/16 1142  . traMADol (ULTRAM) tablet 50-100 mg  50-100 mg Oral Q12H PRN Graciella FreerMichael Andrew Tillery, PA-C   100 mg at 03/24/16 [id number]     Discharge Medications: Please see discharge summary for a list of discharge medications.  Relevant Imaging Results:  Relevant Lab Results:   Additional Information SSN: [id number]   (patient has medicaid) willing to give up check and also remain in SNF for 30 days.  Raye Sorrowoble, Dalya Maselli N, LCSW

## 2016-03-26 NOTE — Clinical Social Work Note (Signed)
Clinical Social Work Assessment  Patient Details  Name: [name] MRN: [id number] Date of Birth: [birth year]  Date of referral:  03/26/16               Reason for consult:  Facility Placement, Discharge Planning                Permission sought to share information with:  Case Manager, Customer service manager, Family Supports Permission granted to share information::  Yes, Verbal Permission Granted  Name::        Agency::     Relationship::  Daughter: Garment/textile technologist Information:     Housing/Transportation Living arrangements for the past 2 months:  [place] of Information:  Patient, Medical Team, Case Manager, Adult Children Patient Interpreter Needed:  None Criminal Activity/Legal Involvement Pertinent to Current Situation/Hospitalization:  No - Comment as needed Significant Relationships:  Adult Children, Other Family Members Lives with:  Self Do you feel safe going back to the place where you live?  Yes Need for family participation in patient care:  Yes (Comment)  Care giving concerns:  Patient reports he lives alone in [place]. Reports he is worried about going home and wants to follow recommendations for 24/7 care.  Reports his daughter are involved and can help out some, but they work during the day.   Patient will need IV antibiotics at discharge. Patient is currently independent walking 462f.   Social Worker assessment / plan:  LCSW received consult for SNF placement. Met with patient and spoke with daughter via speaker phone as she called in to understand plan and recommendations. Discussed SNF and home with home health.  Patient reports he does not work, quit work in September.  Reports he draws SSI and has not applied for Medicare. Reports he does not have 24/hour care and reports this is what is being recommended.  Patient has Medicaid as insurance/payor for SNF.  LCSW explained in order for patient to go to SNF, he must be willing to give  up his Medicaid check for 1 month and remain in SNF for 30 days.  Also made patient and daughter aware that Medicaid does not pay for any physical therapy, only room and board at SNF.  Both are aware and and remain open to SNF.  [facility] however there are other options in surrounding counties in which LCSW will send referral.  Patient and daughter agreeable and report [facility]. [place] was agree[place].  Will complete SNF work up.  Will have PT re-eval and also follow up with bed offers.  Employment status:  Disabled (Comment on whether or not currently receiving Disability) Insurance information:  [place] Supervision Information / Referral to [place] resources[place]  [place] Patient/Family's Response to care:  Agreeable to plan  Patient/Family's Understanding of and Emotional Response to Diagnosis, Current Treatment, and Prognosis:  Patient feels like he does not have a lot of options other than to go to SNF. Understands needs and also recommendations for treatment. Open to recommendations and SW intervention.  Emotional Assessment Appearance:  Appears stated age Attitude/Demeanor/Rapport:    Affect (typically observed):  Accepting, Adaptable, Pleasant Orientation:  Oriented to Self, Oriented to Place, Oriented to  Time, Oriented to Situation Alcohol / Substance use:  Not Applicable Psych involvement (Current and /or in the community):  No (Comment)  Discharge Needs  Concerns to be addressed:  No discharge needs identified Readmission within the last 30 days:  Yes Current discharge risk:  None Barriers to Discharge:  No Barriers Identified, Continued Medical Work up   [place], LCSW 03/26/2016, 3:28 PM

## 2016-03-26 NOTE — Progress Notes (Signed)
Pt c/o brief SOB with exertion, dissipates quickly w/rest. Lungs still clear, dim in bases.  Also says feels odd sensation of being able to "hear and feel heartbeat" in neck when he lays flat, relieved when he sits back up. [place] SpanielSaid is a new sensation. Carotids auscultated, negative for bruits. Carotid pulses palpated - L carotid normal +2, R carotid +3 with obvious distinction of both S1&S2 palpable. No other symptoms. BP & HR stable.   No immediate concerns, pt calm/resting. Will pass on to MD in AM. Will continue to monitor.  Peyton Bottomsachel R Ziyan Schoon, RN 5:09 AM 03/26/16

## 2016-03-26 NOTE — Progress Notes (Signed)
Subjective: No new complaints   Antibiotics:  Anti-infectives    Start     Dose/Rate Route Frequency Ordered Stop   03/25/16 0600  ceFAZolin (ANCEF) IVPB 2g/100 mL premix     2 g 200 mL/hr over 30 Minutes Intravenous Every 8 hours 03/24/16 1330     03/23/16 1702  gentamicin (GARAMYCIN) 80 mg in sodium chloride irrigation 0.9 % 500 mL irrigation  Status:  Discontinued       As needed 03/23/16 1703 03/23/16 2154   03/23/16 1015  gentamicin (GARAMYCIN) 80 mg in sodium chloride irrigation 0.9 % 500 mL irrigation  Status:  Discontinued     80 mg Irrigation To Cath Lab 03/23/16 0958 03/23/16 2315   03/23/16 1015  ceFAZolin (ANCEF) IVPB 2g/100 mL premix  Status:  Discontinued     2 g 200 mL/hr over 30 Minutes Intravenous To Cath Lab 03/23/16 0958 03/23/16 2315   03/19/16 1000  vancomycin (VANCOCIN) IVPB 1000 mg/200 mL premix  Status:  Discontinued     1,000 mg 200 mL/hr over 60 Minutes Intravenous Every 8 hours 03/19/16 0915 03/19/16 0954   03/19/16 1000  cefTRIAXone (ROCEPHIN) 2 g in dextrose 5 % 50 mL IVPB  Status:  Discontinued     2 g 100 mL/hr over 30 Minutes Intravenous Every 24 hours 03/19/16 0954 03/24/16 1330      Medications: Scheduled Meds: . amiodarone  200 mg Oral Daily  . aspirin EC  81 mg Oral Daily  . atorvastatin  40 mg Oral q1800  .  ceFAZolin (ANCEF) IV  2 g Intravenous Q8H  . clopidogrel  75 mg Oral Daily  . digoxin  0.125 mg Oral QODAY  . docusate sodium  100 mg Oral BID  . famotidine  20 mg Oral BID  . heparin  5,000 Units Subcutaneous Q8H  . lidocaine  1 patch Transdermal Q24H  . magnesium oxide  400 mg Oral Daily  . mexiletine  200 mg Oral Q12H  . nicotine  21 mg Transdermal Daily  . PARoxetine  10 mg Oral Daily   Continuous Infusions: . lactated ringers 10 mL/hr at 03/26/16 0700   PRN Meds:.acetaminophen, albuterol, ALPRAZolam, cyclobenzaprine, hydrOXYzine, nitroGLYCERIN, ondansetron (ZOFRAN) IV, oxyCODONE-acetaminophen, sodium chloride  flush, traMADol    Objective: Weight change: -8 lb 9.6 oz (-3.9 kg)  Intake/Output Summary (Last 24 hours) at 03/26/16 1126 Last data filed at 03/26/16 0800  Gross per 24 hour  Intake          1637.13 ml  Output             2255 ml  Net          -617.87 ml   Blood pressure (!) 89/60, pulse 80, temperature 97.5 F (36.4 C), temperature source Oral, resp. rate 10, height 5\' 11"  (1.803 m), weight 202 lb 9.6 oz (91.9 kg), SpO2 97 %. Temp:  [97.4 F (36.3 C)-98.2 F (36.8 C)] 97.5 F (36.4 C) (02/01 0744) Pulse Rate:  [75-84] 80 (02/01 1000) Resp:  [9-22] 10 (02/01 1000) BP: (75-120)/(51-71) 89/60 (02/01 1000) SpO2:  [81 %-100 %] 97 % (02/01 1000) Weight:  [202 lb 9.6 oz (91.9 kg)] 202 lb 9.6 oz (91.9 kg) (02/01 0500)  Physical Exam: General: Alert and awake, oriented x3, not in any acute distress. HEENT: anicteric sclera, pupils reactive to light and accommodation, EOMI CVS regular rate, normal r,  no murmur rubs or gallops Chest: clear to auscultation bilaterally, no wheezing, rales  or rhonchi Abdomen: soft nontender, nondistended, normal bowel sounds,  Skin: tattoos, PM site bandaged Neuro: nonfocal  CBC:    BMET  Recent Labs  03/25/16 0357 03/26/16 0328  NA 131* 135  K 3.8 4.2  CL 96* 99*  CO2 25 30  GLUCOSE 86 106*  BUN 20 18  CREATININE 1.19 1.16  CALCIUM 8.5* 8.7*     Liver Panel   Recent Labs  03/26/16 0328  PROT 6.1*  ALBUMIN 2.9*  AST 16  ALT 19  ALKPHOS 41  BILITOT 0.6       Sedimentation Rate No results for input(s): ESRSEDRATE in the last 72 hours. C-Reactive Protein No results for input(s): CRP in the last 72 hours.  Micro Results: Recent Results (from the past 720 hour(s))  MRSA PCR Screening     Status: None   Collection Time: 03/17/16  5:15 PM  Result Value Ref Range Status   MRSA by PCR NEGATIVE NEGATIVE Final    Comment:        The GeneXpert MRSA Assay (FDA approved for NASAL specimens only), is one component of  a comprehensive MRSA colonization surveillance program. It is not intended to diagnose MRSA infection nor to guide or monitor treatment for MRSA infections.   Culture, blood (Routine X 2) w Reflex to ID Panel     Status: Abnormal   Collection Time: 03/18/16 10:00 AM  Result Value Ref Range Status   Specimen Description BLOOD LEFT ANTECUBITAL  Final   Special Requests IN PEDIATRIC BOTTLE  2CC  Final   Culture  Setup Time   Final    GRAM POSITIVE COCCI IN CLUSTERS IN PEDIATRIC BOTTLE CRITICAL VALUE NOTED.  VALUE IS CONSISTENT WITH PREVIOUSLY REPORTED AND CALLED VALUE.    Culture (A)  Final    STAPHYLOCOCCUS EPIDERMIDIS SUSCEPTIBILITIES PERFORMED ON PREVIOUS CULTURE WITHIN THE LAST 5 DAYS.    Report Status 03/21/2016 FINAL  Final  Culture, blood (Routine X 2) w Reflex to ID Panel     Status: Abnormal   Collection Time: 03/18/16 10:06 AM  Result Value Ref Range Status   Specimen Description BLOOD LEFT HAND  Final   Special Requests IN PEDIATRIC BOTTLE  3CC  Final   Culture  Setup Time   Final    GRAM POSITIVE COCCI IN CLUSTERS IN PEDIATRIC BOTTLE CRITICAL RESULT CALLED TO, READ BACK BY AND VERIFIED WITH: PHARMD A JOHNSTON [id number] MLM    Culture STAPHYLOCOCCUS EPIDERMIDIS (A)  Final   Report Status 03/21/2016 FINAL  Final   Organism ID, Bacteria STAPHYLOCOCCUS EPIDERMIDIS  Final      Susceptibility   Staphylococcus epidermidis - MIC*    CIPROFLOXACIN <=0.5 SENSITIVE Sensitive     ERYTHROMYCIN <=0.25 SENSITIVE Sensitive     GENTAMICIN <=0.5 SENSITIVE Sensitive     OXACILLIN <=0.25 SENSITIVE Sensitive     TETRACYCLINE <=1 SENSITIVE Sensitive     VANCOMYCIN 1 SENSITIVE Sensitive     TRIMETH/SULFA <=10 SENSITIVE Sensitive     CLINDAMYCIN <=0.25 SENSITIVE Sensitive     RIFAMPIN <=0.5 SENSITIVE Sensitive     Inducible Clindamycin NEGATIVE Sensitive     * STAPHYLOCOCCUS EPIDERMIDIS  Blood Culture ID Panel (Reflexed)     Status: Abnormal   Collection Time: 03/18/16 10:06 AM   Result Value Ref Range Status   Enterococcus species NOT DETECTED NOT DETECTED Final   Listeria monocytogenes NOT DETECTED NOT DETECTED Final   Staphylococcus species DETECTED (A) NOT DETECTED Final    Comment: CRITICAL RESULT CALLED  TO, READ BACK BY AND VERIFIED WITH: PHARMD A JOHNSTON [id number] 0904AM MLM    Staphylococcus aureus NOT DETECTED NOT DETECTED Final   Methicillin resistance NOT DETECTED NOT DETECTED Final   Streptococcus species NOT DETECTED NOT DETECTED Final   Streptococcus agalactiae NOT DETECTED NOT DETECTED Final   Streptococcus pneumoniae NOT DETECTED NOT DETECTED Final   Streptococcus pyogenes NOT DETECTED NOT DETECTED Final   Acinetobacter baumannii NOT DETECTED NOT DETECTED Final   Enterobacteriaceae species NOT DETECTED NOT DETECTED Final   Enterobacter cloacae complex NOT DETECTED NOT DETECTED Final   Escherichia coli NOT DETECTED NOT DETECTED Final   Klebsiella oxytoca NOT DETECTED NOT DETECTED Final   Klebsiella pneumoniae NOT DETECTED NOT DETECTED Final   Proteus species NOT DETECTED NOT DETECTED Final   Serratia marcescens NOT DETECTED NOT DETECTED Final   Haemophilus influenzae NOT DETECTED NOT DETECTED Final   Neisseria meningitidis NOT DETECTED NOT DETECTED Final   Pseudomonas aeruginosa NOT DETECTED NOT DETECTED Final   Candida albicans NOT DETECTED NOT DETECTED Final   Candida glabrata NOT DETECTED NOT DETECTED Final   Candida krusei NOT DETECTED NOT DETECTED Final   Candida parapsilosis NOT DETECTED NOT DETECTED Final   Candida tropicalis NOT DETECTED NOT DETECTED Final  Culture, blood (routine x 2)     Status: None   Collection Time: 03/19/16 11:34 PM  Result Value Ref Range Status   Specimen Description BLOOD LEFT ARM  Final   Special Requests BOTTLES DRAWN AEROBIC AND ANAEROBIC  Final   Culture NO GROWTH 5 DAYS  Final   Report Status 03/25/2016 FINAL  Final  Culture, blood (routine x 2)     Status: None   Collection Time: 03/19/16 11:43  PM  Result Value Ref Range Status   Specimen Description BLOOD LEFT HAND  Final   Special Requests AEROBIC BOTTLE ONLY  Final   Culture NO GROWTH 5 DAYS  Final   Report Status 03/25/2016 FINAL  Final  MRSA PCR Screening     Status: None   Collection Time: 03/23/16 11:27 PM  Result Value Ref Range Status   MRSA by PCR NEGATIVE NEGATIVE Final    Comment:        The GeneXpert MRSA Assay (FDA approved for NASAL specimens only), is one component of a comprehensive MRSA colonization surveillance program. It is not intended to diagnose MRSA infection nor to guide or monitor treatment for MRSA infections.   Culture, blood (routine x 2)     Status: None (Preliminary result)   Collection Time: 03/24/16  2:51 AM  Result Value Ref Range Status   Specimen Description BLOOD LEFT WRIST  Final   Special Requests IN PEDIATRIC BOTTLE 1CC  Final   Culture NO GROWTH 1 DAY  Final   Report Status PENDING  Incomplete  Culture, blood (routine x 2)     Status: None (Preliminary result)   Collection Time: 03/24/16  5:53 AM  Result Value Ref Range Status   Specimen Description BLOOD LEFT WRIST  Final   Special Requests BOTTLES DRAWN AEROBIC AND ANAEROBIC 5CC EACH  Final   Culture NO GROWTH 1 DAY  Final   Report Status PENDING  Incomplete  Culture, Urine     Status: None   Collection Time: 03/24/16  7:23 PM  Result Value Ref Range Status   Specimen Description URINE, CLEAN CATCH  Final   Special Requests NONE  Final   Culture NO GROWTH  Final   Report Status 03/26/2016 FINAL  Final    Studies/Results: No results found.    Assessment/Plan:  INTERVAL HISTORY:  Post ICD cultures NGTD   Active Problems:   Acute on chronic systolic congestive heart failure (HCC)   Bacteremia    [name] is a 61 y.o. male with  ICD pocket infection and Bacteremia with Staph Epi (not staph aureus). He Not have vegetations on his leads and his bacteremia appears to have been clearing rapidly.  --  because this is a non-staph aureus species that has cleared from blood quickly we can treat him with just 2 weeks of effective anti staphylococcal antibiotics  --IF his post ICD removal cultures are NG at 72 hours we device can be reimplanted  --timing of IV antiboitics would be day #1  = 03/24/16  Plan provided cultures remain negative would be as follows  Diagnosis: ICD infection with bacteremia  Culture Result: Coag negative staph  No Known Allergies  Discharge antibiotics: cefazolin 2 grams IV q 8 hours   Duration: 2 weeks postop  End Date:  03/06/16  [facility] Care Per Protocol:  Labs weekly while on IV antibiotics: x__ CBC with differential _x_ CMP   _x_ Please pull PIC at completion of IV antibiotics __ Please leave PIC in place until doctor has seen patient or been notified  Fax weekly labs to [phone]  Clinic Follow Up Appt:  With ID MD in 4 weeks  I will follow blood cultures but otherwise follow peripherally     LOS: 9 days   Acey Lav 03/26/2016, 11:26 AM

## 2016-03-27 ENCOUNTER — Encounter (HOSPITAL_COMMUNITY)
Admission: EM | Disposition: A | Discharge status: Home Health | Payer: Self-pay | Source: Home / Self Care | Attending: Cardiothoracic Surgery

## 2016-03-27 DIAGNOSIS — I472 Ventricular tachycardia: Secondary | ICD-10-CM

## 2016-03-27 DIAGNOSIS — Z515 Encounter for palliative care: Secondary | ICD-10-CM

## 2016-03-27 DIAGNOSIS — F411 Generalized anxiety disorder: Secondary | ICD-10-CM

## 2016-03-27 DIAGNOSIS — I5022 Chronic systolic (congestive) heart failure: Secondary | ICD-10-CM

## 2016-03-27 HISTORY — PX: EP IMPLANTABLE DEVICE: SHX172B

## 2016-03-27 LAB — CBC WITH DIFFERENTIAL/PLATELET
BASOS ABS: 0 10*3/uL (ref 0.0–0.1)
BASOS PCT: 1 %
EOS PCT: 9 %
Eosinophils Absolute: 0.7 10*3/uL (ref 0.0–0.7)
HCT: 29.5 % — ABNORMAL LOW (ref 39.0–52.0)
Hemoglobin: 9.9 g/dL — ABNORMAL LOW (ref 13.0–17.0)
LYMPHS PCT: 24 %
Lymphs Abs: 1.8 10*3/uL (ref 0.7–4.0)
MCH: 31.2 pg (ref 26.0–34.0)
MCHC: 33.6 g/dL (ref 30.0–36.0)
MCV: 93.1 fL (ref 78.0–100.0)
Monocytes Absolute: 0.8 10*3/uL (ref 0.1–1.0)
Monocytes Relative: 11 %
Neutro Abs: 4.1 10*3/uL (ref 1.7–7.7)
Neutrophils Relative %: 55 %
PLATELETS: 249 10*3/uL (ref 150–400)
RBC: 3.17 MIL/uL — AB (ref 4.22–5.81)
RDW: 14.8 % (ref 11.5–15.5)
WBC: 7.4 10*3/uL (ref 4.0–10.5)

## 2016-03-27 LAB — TYPE AND SCREEN
ABO/RH(D): A NEG
Antibody Screen: POSITIVE
DAT, IgG: NEGATIVE
PT AG Type: POSITIVE
Unit division: 0
Unit division: 0
Unit division: 0
Unit division: 0

## 2016-03-27 LAB — BASIC METABOLIC PANEL
ANION GAP: 9 (ref 5–15)
ANION GAP: 11 (ref 5–15)
BUN: 18 mg/dL (ref 6–20)
BUN: 16 mg/dL (ref 6–20)
CO2: 28 mmol/L (ref 22–32)
CO2: 28 mmol/L (ref 22–32)
Calcium: 8.9 mg/dL (ref 8.9–10.3)
Calcium: 9.1 mg/dL (ref 8.9–10.3)
Chloride: 98 mmol/L — ABNORMAL LOW (ref 101–111)
Chloride: 99 mmol/L — ABNORMAL LOW (ref 101–111)
Creatinine, Ser: 1.2 mg/dL (ref 0.61–1.24)
Creatinine, Ser: 1.18 mg/dL (ref 0.61–1.24)
GFR calc Af Amer: >60 mL/min (ref >60)
GFR calc Af Amer: >60 mL/min (ref >60)
GLUCOSE: 106 mg/dL — AB (ref 65–99)
Glucose, Bld: 103 mg/dL — ABNORMAL HIGH (ref 65–99)
POTASSIUM: 4 mmol/L (ref 3.5–5.1)
POTASSIUM: 4.2 mmol/L (ref 3.5–5.1)
Sodium: 135 mmol/L (ref 135–145)
Sodium: 138 mmol/L (ref 135–145)

## 2016-03-27 LAB — SURGICAL PCR SCREEN
MRSA, PCR: NEGATIVE
STAPHYLOCOCCUS AUREUS: NEGATIVE

## 2016-03-27 LAB — GLUCOSE, CAPILLARY: Glucose-Capillary: 103 mg/dL — ABNORMAL HIGH (ref 65–99)

## 2016-03-27 SURGERY — ICD IMPLANT

## 2016-03-27 MED ORDER — CEFAZOLIN SODIUM-DEXTROSE 2-4 GM/100ML-% IV SOLN
INTRAVENOUS | Status: AC
  Filled 2016-03-27: qty 100

## 2016-03-27 MED ORDER — SODIUM CHLORIDE 0.9 % IV SOLN: INTRAVENOUS | Status: AC

## 2016-03-27 MED ORDER — FENTANYL CITRATE (PF) 100 MCG/2ML IJ SOLN
INTRAMUSCULAR | Status: DC | PRN
  Administered 2016-03-27: 25 ug via INTRAVENOUS

## 2016-03-27 MED ORDER — ONDANSETRON HCL 4 MG/2ML IJ SOLN: 4.0000 mg | Freq: Four times a day (QID) | INTRAMUSCULAR | Status: DC | PRN

## 2016-03-27 MED ORDER — LIDOCAINE HCL (PF) 1 % IJ SOLN
INTRAMUSCULAR | Status: AC
  Filled 2016-03-27: qty 60

## 2016-03-27 MED ORDER — ACETAMINOPHEN 325 MG PO TABS: 325.0000 mg | ORAL_TABLET | ORAL | Status: DC | PRN

## 2016-03-27 MED ORDER — SODIUM CHLORIDE 0.9 % IR SOLN
Status: AC
  Filled 2016-03-27: qty 2

## 2016-03-27 MED ORDER — MIDAZOLAM HCL 5 MG/5ML IJ SOLN
INTRAMUSCULAR | Status: DC | PRN
  Administered 2016-03-27 (×3): 1 mg via INTRAVENOUS
  Administered 2016-03-27 (×2): 2 mg via INTRAVENOUS
  Administered 2016-03-27: 1 mg via INTRAVENOUS

## 2016-03-27 MED ORDER — CEFAZOLIN SODIUM-DEXTROSE 2-4 GM/100ML-% IV SOLN
2.0000 g | Freq: Three times a day (TID) | INTRAVENOUS | Status: DC
  Administered 2016-03-27 – 2016-03-31 (×12): 2 g via INTRAVENOUS
  Filled 2016-03-27 (×14): qty 100

## 2016-03-27 MED ORDER — HEPARIN (PORCINE) IN NACL 2-0.9 UNIT/ML-% IJ SOLN
INTRAMUSCULAR | Status: AC
  Filled 2016-03-27: qty 500

## 2016-03-27 MED ORDER — LIDOCAINE HCL (PF) 1 % IJ SOLN
INTRAMUSCULAR | Status: DC | PRN
  Administered 2016-03-27: 45 mL

## 2016-03-27 MED ORDER — MIDAZOLAM HCL 5 MG/5ML IJ SOLN
INTRAMUSCULAR | Status: AC
  Filled 2016-03-27: qty 5

## 2016-03-27 MED ORDER — IOPAMIDOL (ISOVUE-370) INJECTION 76%
INTRAVENOUS | Status: DC | PRN
  Administered 2016-03-27: 15 mL via INTRAVENOUS

## 2016-03-27 MED ORDER — IOPAMIDOL (ISOVUE-370) INJECTION 76%
INTRAVENOUS | Status: AC
  Filled 2016-03-27: qty 50

## 2016-03-27 MED ORDER — PAROXETINE HCL 20 MG PO TABS
20.0000 mg | ORAL_TABLET | Freq: Every day | ORAL | Status: DC
  Administered 2016-03-28 – 2016-03-31 (×4): 20 mg via ORAL
  Filled 2016-03-27 (×4): qty 1

## 2016-03-27 MED ORDER — FENTANYL CITRATE (PF) 100 MCG/2ML IJ SOLN
INTRAMUSCULAR | Status: DC | PRN
  Administered 2016-03-27: 25 ug via INTRAVENOUS
  Administered 2016-03-27 (×3): 50 ug via INTRAVENOUS

## 2016-03-27 MED ORDER — CEFAZOLIN IN D5W 1 GM/50ML IV SOLN: 1.0000 g | Freq: Four times a day (QID) | INTRAVENOUS | Status: DC

## 2016-03-27 MED ORDER — FENTANYL CITRATE (PF) 100 MCG/2ML IJ SOLN
INTRAMUSCULAR | Status: AC
  Filled 2016-03-27: qty 2

## 2016-03-27 MED ORDER — CEFAZOLIN SODIUM-DEXTROSE 2-4 GM/100ML-% IV SOLN
2.0000 g | Freq: Three times a day (TID) | INTRAVENOUS | Status: DC
  Filled 2016-03-27 (×2): qty 100

## 2016-03-27 MED ORDER — SPIRONOLACTONE 25 MG PO TABS
12.5000 mg | ORAL_TABLET | Freq: Every day | ORAL | Status: DC
  Administered 2016-03-27 – 2016-03-29 (×3): 12.5 mg via ORAL
  Filled 2016-03-27 (×3): qty 1

## 2016-03-27 SURGICAL SUPPLY — 17 items
CABLE SURGICAL S-101-97-12 (CABLE) ×4 IMPLANT
CATH RIGHTSITE C315HIS02 (CATHETERS) ×2 IMPLANT
ELECT DEFIB PAD ADLT CADENCE (PAD) ×2 IMPLANT
HEMOSTAT SURGICEL 2X4 FIBR (HEMOSTASIS) ×2 IMPLANT
HOVERMATT SINGLE USE (MISCELLANEOUS) ×2 IMPLANT
ICD VIVA XT CRT-D DTBA1D4 (ICD Generator) ×2 IMPLANT
LEAD CAPSURE NOVUS 5076-52CM (Lead) ×2 IMPLANT
LEAD SELECT SECURE 3830 383069 (Lead) IMPLANT
LEAD SPRINT QUAT SEC 6935M-62 (Lead) ×2 IMPLANT
PAD DEFIB LIFELINK (PAD) ×2 IMPLANT
SELECT SECURE 3830 383069 (Lead) ×3 IMPLANT
SET INTRODUCER MICROPUNCT 5F (INTRODUCER) ×2 IMPLANT
SHEATH CLASSIC 7F (SHEATH) ×4 IMPLANT
SHEATH CLASSIC 9F (SHEATH) ×2 IMPLANT
SLITTER 6232ADJ (MISCELLANEOUS) ×2 IMPLANT
TRAY PACEMAKER INSERTION (PACKS) ×2 IMPLANT
WIRE HI TORQ VERSACORE-J 145CM (WIRE) ×2 IMPLANT

## 2016-03-27 NOTE — Progress Notes (Signed)
Patient ID: [name], male   DOB: 12-30-1955, 61 y.o.   MRN: [id number]     Advanced Heart Failure Rounding Note  Primary Cardiologist: Dr Jens Som Primary HF: Dr. Shirlee Latch   Subjective:    Admitted from ED 03/17/16 with recurrent low output HF with NYHA IIIb-IV symptoms.   He was on milrinone initially with diuresis, now off.  RHC 1/26 looked good.   BCx 03/18/16 2/2 coag negative staph. ICD pocked noted to have serous drainage and exposed ICD. CT abdomen/pelvis did not show evidence for discitis or acute MSK changes Afebrile. WBCs WNL currently.   Repeat BCx 03/24/16 NGTD. Haven't resulted today.   S/p ICD extraction 03/23/16 with placement of Temporary permanent pacemaker with previous pacer dependency due to CHB.   TEE on 1/26 with no evidence of vegetation.   Meds held with orthostasis 03/24/16. Given gentle IVF.   Feeling OK this a morning. Anxious about procedure. Denies SOB or lightheadedness   Weight down 1 lb.  Creatinine stable.     RHC Procedural Findings (1/26, off milrinone): Hemodynamics (mmHg) RA mean 1 RV 36/8 PA 36/11, mean 21 PCWP mean 7 Oxygen saturations: PA 62% AO 96% Cardiac Output (Fick) 6.58  Cardiac Index (Fick) 3.06 Cardiac Output (Thermo) 6.62 Cardiac Index (Thermo) 3.08  Objective:   Weight Range: 201 lb 11.2 oz (91.5 kg) Body mass index is 28.13 kg/m.   Vital Signs:   Temp:  [97.3 F (36.3 C)-97.9 F (36.6 C)] 97.9 F (36.6 C) (02/02 0300) Pulse Rate:  [61-101] 79 (02/02 0400) Resp:  [10-21] 12 (02/02 0400) BP: (76-120)/(38-79) 92/59 (02/02 0400) SpO2:  [91 %-100 %] 100 % (02/02 0400) Weight:  [201 lb 11.2 oz (91.5 kg)] 201 lb 11.2 oz (91.5 kg) (02/02 0500) Last BM Date: 03/26/16  Weight change: Filed Weights   03/25/16 0500 03/26/16 0500 03/27/16 0500  Weight: 212 lb 1.3 oz (96.2 kg) 202 lb 9.6 oz (91.9 kg) 201 lb 11.2 oz (91.5 kg)    Intake/Output:   Intake/Output Summary (Last 24 hours) at 03/27/16 0742 Last data filed  at 03/27/16 0604  Gross per 24 hour  Intake             1550 ml  Output             2475 ml  Net             -925 ml     Physical Exam: General: Elderly appearing. NAD.        HEENT: Normal Neck: supple. JVP 8 cm. Carotids 2+ bilat; no bruits. No thyromegaly or nodule noted.  Cor: PMI nondisplaced. Regular. No M/G/R appreciated. No M/G/R   Left chest dressing C/D/I s/p ICD extraction. R sided pacer site stable.  Lungs: CTAB, normal effort Abdomen: soft, NT, ND, no HSM. No bruits or masses. +BS   Extremities: no cyanosis, clubbing, rash. No edema.     Neuro: alert & oriented x 3, cranial nerves grossly intact. moves all 4 extremities w/o difficulty. Affect pleasant.  Telemetry: Reviewed, A sensed V paced. Occasional NSVT   Labs: CBC  Recent Labs  03/26/16 0328 03/27/16 0403  WBC 6.7 7.4  NEUTROABS 4.0 4.1  HGB 9.7* 9.9*  HCT 29.6* 29.5*  MCV 92.5 93.1  PLT 231 249   Basic Metabolic Panel  Recent Labs  03/25/16 0357 03/26/16 0328 03/27/16 0403  NA 131* 135 135  K 3.8 4.2 4.0  CL 96* 99* 98*  CO2 25 30 28  GLUCOSE 86 106* 103*  BUN 20 18 18   CREATININE 1.19 1.16 1.20  CALCIUM 8.5* 8.7* 8.9  MG 1.7 2.0  --    Liver Function Tests  Recent Labs  03/26/16 0328  AST 16  ALT 19  ALKPHOS 41  BILITOT 0.6  PROT 6.1*  ALBUMIN 2.9*   No results for input(s): LIPASE, AMYLASE in the last 72 hours. Cardiac Enzymes No results for input(s): CKTOTAL, CKMB, CKMBINDEX, TROPONINI in the last 72 hours.  BNP: BNP (last 3 results)  Recent Labs  01/13/16 1008 01/27/16 1155 03/17/16 0311  BNP 256.0* 595.7* 894.5*    ProBNP (last 3 results) No results for input(s): PROBNP in the last 8760 hours.   D-Dimer No results for input(s): DDIMER in the last 72 hours. Hemoglobin A1C No results for input(s): HGBA1C in the last 72 hours. Fasting Lipid Panel No results for input(s): CHOL, HDL, LDLCALC, TRIG, CHOLHDL, LDLDIRECT in the last 72 hours. Thyroid Function  Tests No results for input(s): TSH, T4TOTAL, T3FREE, THYROIDAB in the last 72 hours.  Invalid input(s): FREET3  Other results:  Imaging/Studies:  No results found.  Medications:    Scheduled Medications: . amiodarone  200 mg Oral Daily  . aspirin EC  81 mg Oral Daily  . atorvastatin  40 mg Oral q1800  .  ceFAZolin (ANCEF) IV  2 g Intravenous Q8H  .  ceFAZolin (ANCEF) IV  2 g Intravenous To Cath  . chlorhexidine  60 mL Topical Once  . clopidogrel  75 mg Oral Daily  . digoxin  0.125 mg Oral QODAY  . docusate sodium  100 mg Oral BID  . famotidine  20 mg Oral BID  . feeding supplement (ENSURE ENLIVE)  237 mL Oral BID BM  . furosemide  40 mg Oral Daily  . gentamicin irrigation  80 mg Irrigation To Cath  . lidocaine  1 patch Transdermal Q24H  . magnesium oxide  400 mg Oral Daily  . mexiletine  200 mg Oral Q12H  . nicotine  21 mg Transdermal Daily  . PARoxetine  10 mg Oral Daily    Infusions: . sodium chloride 50 mL/hr at 03/27/16 0556    PRN Medications: acetaminophen, albuterol, ALPRAZolam, cyclobenzaprine, hydrOXYzine, nitroGLYCERIN, ondansetron (ZOFRAN) IV, oxyCODONE-acetaminophen, sodium chloride flush, traMADol  Assessment/Plan   1. Acute on chronic systolic CHF: Echo 11/2015 LVEF 15%, ischemic cardiomyopathy.  Initially on milrinone and diuresed, now off milrinone and back on po Lasix.  See RHC above => filling pressures optimized and CO preserved off milrinone.  Initial presentation may have been related to Coag negative Staph. Have begun LVAD work up. TEE 1/26; LVEF 15% mod-severe MR. He developed orthostatic hypotension after dual chamber ICD removed and temp-perm pacer placed, possible that constant RV pacing has caused some worsening of his cardiac output.  - Continue Lasix 40 mg po daily.    - Pressures remains soft in 90s.  - He is off bisoprolol, losartan, spironolactone for now. Will add back gradually as tolerated => probably after device re-implantation  tomorrow.   - Continue digoxin 0.125 mg daily. Level 0.2 on admit.  - Possible replacement of device with His bundle pacing this afternoon.  - On going work up for LVAD. May eventually be transplant candidate. Just stopped smoking PTA. VAD coordinator and Dr. Laneta SimmersBartle have seen. HFSW to plan family meeting.  2. CAD: Recent LHC in 12/17 with stable coronary disease  - Continue statin, ASA, Plavix.  No change to current plan.  3.  Ascending aortic aneurysm: 4.1 cm 8/17. No evidence of dissection on CTA 03/17/16.  4. VT storm in 12/17: Continue amiodarone and Mexiletine.  - EP following with lead extraction.  - Short run NSVT 03/25/16.   - Now s/p ICD extraction and placement of temporary-permanent pacemaker.  ICD to be re-implanted 03/27/16 with possible His bundle pacing.  - Follow electrolytes. Supp as needed.  5. Low back pain, Chronic - Ortho has seen. Suspect MSK.  No discitis/Abscess on CT abdomen/pelvis.  - Continue flexeril and tramadol prn.  6. 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  - s/p ICD extraction due to infected pocked.  - Had temporary-permanent device placed with RV pacing.  To have device replaced today if cultures remain negative, hopefully device with His bundle pacing.  7. ID: Blood cultures (done for back pain, ?discitis given concerns about ICD pocket) returned 2/2 coag negative Staph.  ICD eroded with drainage from site.  - s/p Extraction 03/23/16 as above.  - Continue cefazolin IV per ID. Will be on until 04/19/16 (will need PICC) Blood cultures re-drawn 1/25. NGTD.  - TEE 1/26 no vegetation  - Per ID OK to re-implant CRT-D as soon as 03/27/16. - No change to current plan.  Will need PICC prior to d/c.   Graciella Freer, PA-C  03/27/2016 7:42 AM   Advanced Heart Failure Team Pager [phone] (M-F; 7a - 4p)  Please contact [facility] for night-coverage after hours (4p -7a ) and weekends on [email or web address]  Patient seen with PA,  agree with the above note.  Stable, orthostatic symptoms have resolved and BP higher.  Plan for ICD implantation today, hopefully with His bundle pacing capability.    I will restart spironolactone at 12.5 daily today, continue po Lasix and digoxin. Hopefully I will be able to start back his other meds after pacemaker replaced.   Antibiotics will be needed until 2/25, will place PICC prior to discharge.   Marca Ancona 03/27/2016 8:07 AM

## 2016-03-27 NOTE — H&P (Signed)
ICD Criteria  Current LVEF:20%. Within 12 months prior to implant: Yes   Heart failure history: Yes, Class III  Cardiomyopathy history: Yes, Ischemic Cardiomyopathy - Prior MI.  Atrial Fibrillation/Atrial Flutter: No.  Ventricular tachycardia history: Yes, Hemodynamic instability present. VT Type: Sustained Ventricular Tachycardia - Monomorphic.  Cardiac arrest history: No.  History of syndromes with risk of sudden death: No.  Previous ICD: Yes, Reason for ICD:  Secondary prevention.  Current ICD indication: Secondary  PPM indication: Yes. Pacing type: Both. Less than 40% RV pacing requirement anticipated. Indication: Complete Heart Block   Class I or II Bradycardia indication present: Yes  Beta Blocker therapy for 3 or more months: Yes, prescribed.   Ace Inhibitor/ARB therapy for 3 or more months: Yes, prescribed.

## 2016-03-27 NOTE — Progress Notes (Signed)
Occupational Therapy Treatment Patient Details Name: POSEIDON PAM MRN: [id number] DOB: 12/26/1955 Today's Date: 03/27/2016    History of present illness Pt is a 61 y/o male admitted secondary to worsening SOB, acute CHF exacerbation. Pt with infected pacemaker s/p excision with temporary pacer placed. Planned AICD implantation 03/27/16. 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 s/p cardioversion 03/20/16.    OT comments  Pt with improved activity tolerance and less pain.  He was able to perform functional transfers and ambulation with min guard assist.  Session cut short due to transport arrived for procedure.  Will continue to follow.   Follow Up Recommendations  SNF    Equipment Recommendations  None recommended by OT    Recommendations for Other Services      Precautions / Restrictions Precautions Precautions: ICD/Pacemaker Precaution Comments: pt aware of precautions and able to recall Restrictions Weight Bearing Restrictions: No       Mobility Bed Mobility Overal bed mobility: Needs Assistance Bed Mobility: Supine to Sit;Sit to Supine     Supine to sit: Min guard Sit to supine: Min guard      Transfers Overall transfer level: Needs assistance Equipment used: Rolling walker (2 wheeled) Transfers: Sit to/from [employer] Sit to Stand: Min guard Stand pivot transfers: Min guard       General transfer comment: min guard for balance     Balance Overall balance assessment: Needs assistance Sitting-balance support: Feet supported Sitting balance-Leahy Scale: Good     Standing balance support: No upper extremity supported Standing balance-Leahy Scale: Fair                     ADL Overall ADL's : Needs assistance/impaired                         Toilet Transfer: Min guard;Ambulation;Comfort height toilet;BSC;Grab bars;RW   Toileting- Architect and  Hygiene: Min guard;Sit to/from stand       Functional mobility during ADLs: Publishing rights manager     Praxis      Cognition   Behavior During Therapy: WFL for tasks assessed/performed Overall Cognitive Status: Within Functional Limits for tasks assessed                       Extremity/Trunk Assessment               Exercises     Shoulder Instructions       General Comments      Pertinent Vitals/ Pain       Pain Assessment: Faces Faces Pain Scale: Hurts little more Pain Location: lower back Pain Descriptors / Indicators: Aching;Grimacing Pain Intervention(s): Monitored during session  Home Living                                          Prior Functioning/Environment              Frequency  Min 2X/week        Progress Toward Goals  OT Goals(current goals can now be found in the care plan section)  Progress towards OT goals: Progressing  toward goals     Plan Discharge plan needs to be updated    Co-evaluation                 End of Session Equipment Utilized During Treatment: Rolling walker;Oxygen   Activity Tolerance Other (comment) (Transport arrived for procedure )   Patient Left in bed;with call bell/phone within reach;with nursing/sitter in room;with family/visitor present   Nurse Communication Mobility status        Time: [id number] OT Time Calculation (min): 20 min  Charges: OT General Charges $OT Visit: 1 Procedure OT Treatments $Self Care/Home Management : 8-22 mins  Akiera Allbaugh M 03/27/2016, 2:58 PM

## 2016-03-27 NOTE — Progress Notes (Signed)
Back from the EP lab awake and alert.right arm sling in used. Dressing to right chest dry and intact. Hooked up to zoll, continue to monitor.

## 2016-03-27 NOTE — Progress Notes (Signed)
Patient Name: [name] Date of Encounter: 03/27/2016     Active Problems:   Acute on chronic systolic congestive heart failure (HCC)   Bacteremia    SUBJECTIVE  S/p ICD system extraction. Stable. C/o back pain. No chest pain.  Some anxiety w recurrent Nonsustained VT  Less sob today  CURRENT MEDS . amiodarone  200 mg Oral Daily  . aspirin EC  81 mg Oral Daily  . atorvastatin  40 mg Oral q1800  .  ceFAZolin (ANCEF) IV  2 g Intravenous To Cath  .  ceFAZolin (ANCEF) IV  2 g Intravenous Q8H  . clopidogrel  75 mg Oral Daily  . digoxin  0.125 mg Oral QODAY  . docusate sodium  100 mg Oral BID  . famotidine  20 mg Oral BID  . feeding supplement (ENSURE ENLIVE)  237 mL Oral BID BM  . furosemide  40 mg Oral Daily  . gentamicin irrigation  80 mg Irrigation To Cath  . lidocaine  1 patch Transdermal Q24H  . magnesium oxide  400 mg Oral Daily  . mexiletine  200 mg Oral Q12H  . nicotine  21 mg Transdermal Daily  . PARoxetine  10 mg Oral Daily  . spironolactone  12.5 mg Oral Daily    OBJECTIVE  Vitals:   03/27/16 0500 03/27/16 0756 03/27/16 0933 03/27/16 1108  BP:  109/71  102/62  Pulse:  80 80 80  Resp:  16  11  Temp:  97.4 F (36.3 C)  97.5 F (36.4 C)  TempSrc:  Oral  Oral  SpO2:  100%  98%  Weight: 201 lb 11.2 oz (91.5 kg)     Height:        Intake/Output Summary (Last 24 hours) at 03/27/16 1318 Last data filed at 03/27/16 1200  Gross per 24 hour  Intake             1370 ml  Output             2600 ml  Net            -1230 ml   Filed Weights   03/25/16 0500 03/26/16 0500 03/27/16 0500  Weight: 212 lb 1.3 oz (96.2 kg) 202 lb 9.6 oz (91.9 kg) 201 lb 11.2 oz (91.5 kg)    PHYSICAL EXAM  General: Pleasant, NAD. Neuro: Alert and oriented X 3. Moves all extremities spontaneously. Psych: Normal affect. HEENT:  Normal  Neck: Supple without bruits or JVD. Indwelling right IJ PPM. Lungs:  Resp regular and unlabored, CTA. Heart: RRR no s3, s4, or  murmurs. ICD pocket - with a small amount of serosanguinous drainage Abdomen: Soft, non-tender, non-distended, BS + x 4.  Extremities: No clubbing, cyanosis or edema. DP/PT/Radials 2+ and equal bilaterally.  Accessory Clinical Findings  CBC  Recent Labs  03/26/16 0328 03/27/16 0403  WBC 6.7 7.4  NEUTROABS 4.0 4.1  HGB 9.7* 9.9*  HCT 29.6* 29.5*  MCV 92.5 93.1  PLT 231 249   Basic Metabolic Panel  Recent Labs  03/25/16 0357 03/26/16 0328 03/27/16 0403  NA 131* 135 135  K 3.8 4.2 4.0  CL 96* 99* 98*  CO2 25 30 28   GLUCOSE 86 106* 103*  BUN 20 18 18   CREATININE 1.19 1.16 1.20  CALCIUM 8.5* 8.7* 8.9  MG 1.7 2.0  --    Liver Function Tests  Recent Labs  03/26/16 0328  AST 16  ALT 19  ALKPHOS 41  BILITOT 0.6  PROT 6.1*  ALBUMIN 2.9*   No results for input(s): LIPASE, AMYLASE in the last 72 hours. Cardiac Enzymes No results for input(s): CKTOTAL, CKMB, CKMBINDEX, TROPONINI in the last 72 hours. BNP Invalid input(s): POCBNP D-Dimer No results for input(s): DDIMER in the last 72 hours. Hemoglobin A1C No results for input(s): HGBA1C in the last 72 hours. Fasting Lipid Panel No results for input(s): CHOL, HDL, LDLCALC, TRIG, CHOLHDL, LDLDIRECT in the last 72 hours. Thyroid Function Tests No results for input(s): TSH, T4TOTAL, T3FREE, THYROIDAB in the last 72 hours.  Invalid input(s): FREET3  Telemetry Personally reviewed no VT  Radiology/Studies   Chest Xray personally reviewed  No PICC   Dg Chest 2 View  Result Date: 03/24/2016 CLINICAL DATA:  Pacer removal EXAM: CHEST  2 VIEW COMPARISON:  7 days ago FINDINGS: Single chamber pacer from the right with lead in the expected location of the right ventricle. Left-sided pacer has been removed. Cardiopericardial size is stable. Cardiomegaly and coronary stent. Low volume chest without pneumothorax. Chest wall emphysema on the left, presumably related to dissection of generator pack. IMPRESSION: 1. Stable  cardiopericardial size after left-sided pacer removal. Left chest wall gas, no pneumothorax. 2. New right-sided pacer with right ventricular lead. 3. Low volume chest. Electronically Signed   By: Marnee Spring M.D.   On: 03/24/2016 08:39   Dg Chest 2 View  Result Date: 03/17/2016 CLINICAL DATA:  Chest and lower back pain. History of 3 myocardial infarctions with stents. EXAM: CHEST  2 VIEW COMPARISON:  01/29/2016 CXR FINDINGS: Left-sided AICD device with coronary sinus, right atrial and right ventricular leads present. Cardiomegaly is identified with coronary arterial stenting. No aortic aneurysm. Chronic mild interstitial prominence without pneumonic consolidation, effusion or pneumothorax. Mild apical pleuroparenchymal thickening and scarring. No acute osseous abnormality. IMPRESSION: Cardiomegaly without acute pulmonary disease. Electronically Signed   By: Tollie Eth M.D.   On: 03/17/2016 03:53   Dg Lumbar Spine 2-3 Views  Result Date: 03/18/2016 CLINICAL DATA:  Acute low back pain without known injury. EXAM: LUMBAR SPINE - 2-3 VIEW COMPARISON:  CT scan of December 16, 2015. FINDINGS: No fracture or spondylolisthesis is noted. Atherosclerosis of abdominal aorta is noted. Mild degenerative disc disease is noted at L3-4. Remaining disc spaces appear intact. IMPRESSION: Mild degenerative disc disease at L3-4. No acute abnormality seen in the lumbar spine. Aortic atherosclerosis. Electronically Signed   By: Lupita Raider, M.D.   On: 03/18/2016 11:36   Dg Chest Port 1 View  Result Date: 03/17/2016 CLINICAL DATA:  PICC line placement EXAM: PORTABLE CHEST 1 VIEW COMPARISON:  03/17/2016 FINDINGS: The visualized bony structures of the thorax are intact. 2054 hours. Right PICC line tip overlies the distal SVC level. The cardio pericardial silhouette is enlarged. The lungs are clear wiithout focal pneumonia, edema, pneumothorax or pleural effusion. Left pacer/AICD again noted. IMPRESSION: 1. Right PICC line  tip overlies the distal SVC. 2. Cardiomegaly. Electronically Signed   By: Kennith Center M.D.   On: 03/17/2016 21:05   Ct Angio Chest/abd/pel For Dissection W And/or Wo Contrast  Result Date: 03/17/2016 CLINICAL DATA:  Right-sided chest pain EXAM: CT ANGIOGRAPHY CHEST, ABDOMEN AND PELVIS TECHNIQUE: Multidetector CT imaging through the chest, abdomen and pelvis was performed using the standard protocol during bolus administration of intravenous contrast. Multiplanar reconstructed images and MIPs were obtained and reviewed to evaluate the vascular anatomy. CONTRAST:  100 mL Isovue 370. COMPARISON:  Chest x-ray from earlier in the same day, 12/16/2015, 10/18/2015 FINDINGS: CTA  CHEST FINDINGS Cardiovascular: The thoracic aorta shows mild atherosclerotic calcifications without aneurysmal dilatation or dissection. Pulmonary artery shows a normal branching pattern without evidence of pulmonary embolism. Coronary calcifications and prior coronary stenting is noted. A defibrillator is again noted. Cardiac structures are mildly enlarged but stable. Multiple chest wall venous collaterals are identified likely related to stenosis of the left subclavian vein from the defibrillator. Mediastinum/Nodes: Thoracic inlet is within normal limits. Stable mediastinal adenopathy is noted in the right peritracheal and subcarinal region with the largest of these in the right peritracheal region measuring 16 mm in short axis which is stable from the prior exam. Scattered small hilar lymph nodes are noted also stable from the previous study. The esophagus as visualize is within normal limits. Lungs/Pleura: Mild emphysematous changes are noted. No focal infiltrate or sizable effusion is seen. Mild peribronchial thickening is noted which is stable from the prior exam likely of a chronic nature. No acute abnormality is seen. Musculoskeletal: Degenerative changes of the thoracic spine are noted. No acute bony abnormality is seen. Review of the  MIP images confirms the above findings. CTA ABDOMEN AND PELVIS FINDINGS VASCULAR Aorta: The abdominal aorta demonstrates atherosclerotic calcifications without aneurysmal dilatation or evidence of dissection. Celiac: Mild atherosclerotic calcifications are noted at the origin although the celiac axis is widely patent. SMA: Mild atherosclerotic changes are noted at the origin although the superior mesenteric artery is patent. Renals: Single renal arteries are identified bilaterally with mild atherosclerotic calcifications. No focal hemodynamically significant stenosis is noted. IMA: Widely patent Iliacs: Mild atherosclerotic changes are seen without aneurysmal dilatation or dissection. Veins: Although not timed for venous evaluation no definitive abnormality is seen. Review of the MIP images confirms the above findings. NON-VASCULAR Hepatobiliary: Multiple hypodensities are again identified within the liver consistent with hepatic cysts. These are stable from previous exams. The gallbladder has been surgically removed. Pancreas: Unremarkable. No pancreatic ductal dilatation or surrounding inflammatory changes. Spleen: Normal in size without focal abnormality. Adrenals/Urinary Tract: The adrenal glands are within normal limits. Kidneys are well visualized bilaterally without evidence of renal mass or calcification. No obstructive changes are seen. The bladder is well distended. Stomach/Bowel: Scattered diverticular changes are noted throughout the colon. The appendix is well visualized and within normal limits. No obstructive changes are seen. No inflammatory changes are identified. Lymphatic: No significant lymphadenopathy is identified. Reproductive: Prostate is unremarkable. Other: No abdominal wall hernia or abnormality. No abdominopelvic ascites. Musculoskeletal: No acute or significant osseous findings. Review of the MIP images confirms the above findings. IMPRESSION: CTA of the chest: No evidence of aortic  dissection or aneurysmal dilatation. No definitive pulmonary emboli are identified. Stable mediastinal and hilar lymph nodes likely reactive in nature. Mild changes of bronchitis are noted as well. 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 CTA of the abdomen and pelvis. No aneurysmal dilatation or dissection is noted. Scattered chronic changes similar to that noted on previous exams. Electronically Signed   By: Alcide Clever M.D.   On: 03/17/2016 08:33    ASSESSMENT AND PLAN  1. ICD system infection - s/p extraction. He is stable hemodynamically. His pocket has a small amount of drainage. I removed packing material this morning. 2. 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 3. Difficulty with urination - he had a bladder scan with 600 cc of urine. He may require a foley catheter 4. Chronic systolic heart failure - his device is out. He will require a  new device. Timing in part dependent on ID service rec's. 5.  Ventricular tachycardia storm, ? LV proarrhythmia 6  Orthostatic intolerance 7. 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

## 2016-03-27 NOTE — Progress Notes (Signed)
Daily Progress Note   Patient Name: [name]       Date: 03/27/2016 DOB: 06-19-1955  Age: 61 y.o. MRN#: [id number] Attending Physician: Kerin Perna, MD Primary Care Physician: Tula Nakayama, MD Admit Date: 03/17/2016  Reason for Consultation/Follow-up: VAD eval  Subjective: [name] is lying in bed and complains of back pain down his left leg.   Length of Stay: 10  Current Medications: Scheduled Meds:  . amiodarone  200 mg Oral Daily  . aspirin EC  81 mg Oral Daily  . atorvastatin  40 mg Oral q1800  .  ceFAZolin (ANCEF) IV  2 g Intravenous To Cath  .  ceFAZolin (ANCEF) IV  2 g Intravenous Q8H  . clopidogrel  75 mg Oral Daily  . digoxin  0.125 mg Oral QODAY  . docusate sodium  100 mg Oral BID  . famotidine  20 mg Oral BID  . feeding supplement (ENSURE ENLIVE)  237 mL Oral BID BM  . furosemide  40 mg Oral Daily  . gentamicin irrigation  80 mg Irrigation To Cath  . lidocaine  1 patch Transdermal Q24H  . magnesium oxide  400 mg Oral Daily  . mexiletine  200 mg Oral Q12H  . nicotine  21 mg Transdermal Daily  . [START ON 03/28/2016] PARoxetine  20 mg Oral Daily  . spironolactone  12.5 mg Oral Daily    Continuous Infusions: . sodium chloride 50 mL/hr at 03/27/16 0700    PRN Meds: acetaminophen, albuterol, ALPRAZolam, cyclobenzaprine, hydrOXYzine, nitroGLYCERIN, ondansetron (ZOFRAN) IV, oxyCODONE-acetaminophen, sodium chloride flush, traMADol  Physical Exam  Constitutional: He is oriented to person, place, and time. He appears well-developed.  HENT:  Head: Normocephalic and atraumatic.  Cardiovascular: Normal rate.   Pulmonary/Chest: Effort normal. No accessory muscle usage. No tachypnea. No respiratory distress.  Abdominal: Normal appearance.  Neurological: He  is alert and oriented to person, place, and time.  Nursing note and vitals reviewed.           Vital Signs: BP 102/62 (BP Location: Left Arm)   Pulse 80   Temp 97.5 F (36.4 C) (Oral)   Resp 11   Ht 5\' 11"  (1.803 m)   Wt 91.5 kg (201 lb 11.2 oz)   SpO2 98%   BMI 28.13 kg/m  SpO2: SpO2: 98 % O2  Device: O2 Device: Not Delivered O2 Flow Rate: O2 Flow Rate (L/min): 2 L/min  Intake/output summary:  Intake/Output Summary (Last 24 hours) at 03/27/16 1343 Last data filed at 03/27/16 1300  Gross per 24 hour  Intake             1420 ml  Output             2600 ml  Net            -1180 ml   LBM: Last BM Date: 03/26/16 Baseline Weight: Weight: 95.9 kg (211 lb 6.4 oz) Most recent weight: Weight: 91.5 kg (201 lb 11.2 oz)       Palliative Assessment/Data:    Flowsheet Rows   Flowsheet Row Most Recent Value  Intake Tab  Referral Department  Cardiology  Unit at Time of Referral  Cardiac/Telemetry Unit  Palliative Care Primary Diagnosis  Cardiac  Date Notified  03/20/16  Palliative Care Type  New Palliative care  Reason for referral  Other (Comment) [VAD Eval]  Date of Admission  03/17/16  Date first seen by Palliative Care  03/21/16  # of days Palliative referral response time  1 Day(s)  # of days IP prior to Palliative referral  3  Clinical Assessment  Psychosocial & Spiritual Assessment  Palliative Care Outcomes      Patient Active Problem List   Diagnosis Date Noted  . Bacteremia   . Acute on chronic systolic congestive heart failure (HCC) 03/17/2016  . Pulmonary edema   . VF (ventricular fibrillation) (HCC) 01/27/2016  . Encounter for intubation   . Acute respiratory failure with hypoxia (HCC)   . Hypokalemia   . DOE (dyspnea on exertion) 06/19/2015  . Acute systolic congestive heart failure, NYHA class 3 (HCC)   . ICD (implantable cardioverter-defibrillator) in place   . H/O medication noncompliance   . Acute kidney insufficiency   . Chest pain at rest   .  Depression 04/09/2015  . Acute on chronic systolic CHF (congestive heart failure) (HCC) 04/09/2015  . Erectile dysfunction 04/09/2015  . RBBB   . PVC's (premature ventricular contractions)   . Chronic systolic CHF (congestive heart failure), NYHA class 3 (HCC) - low output state 12/12/2014  . Generalized anxiety disorder 12/09/2014  . Major depressive disorder, recurrent episode (HCC) 12/09/2014  . TIA (transient ischemic attack)   . HLD (hyperlipidemia)   . Aphasia 05/04/2014  . Unstable angina (HCC) 06/02/2011  . CAD (coronary artery disease)   . Ischemic cardiomyopathy   . Automatic implantable cardioverter-defibrillator in situ   . TOBACCO ABUSE 02/04/2010  . VENTRICULAR TACHYCARDIA 08/21/2008  . HYPERLIPIDEMIA-MIXED 06/04/2008  . ANXIETY 06/04/2008  . Essential hypertension 06/04/2008  . Cardiomyopathy, ischemic 06/04/2008  . CHEST PAIN-UNSPECIFIED 06/04/2008    Palliative Care Assessment & Plan   HPI: 61 y.o. male  with past medical history of systolic CHF (EF 16%), CAD, s/p Medtronic AICD, depression, anxiety, HLD, HTN, TIA admitted on 03/17/2016 with heart failure exacerbation, bacteremia and ICD likely to require removal, followed by ID. Currently being evaluated for potential VAD candidacy.    Assessment: [name] is hurting more today and has refused to walk as "I don't feel like it." He is also concerned with his discharge plan as he feels he cannot go home because his back pain is limiting him but says he cannot afford to give up his check as he has bills to pay.   We discussed his anxiety/depression and he agrees to increase his Paxil.  He is very overwhelmed right now with his current situation and has not thought much more about LVAD. Emotional support provided.   Recommendations/Plan:  Anxiety/depression: Increase Paxil from 10 mg to 20 mg daily.    Chronic back pain: Continue PT, Percocet and Flexeril prn. May consider injection if no improvement.   Goals of  Care and Additional Recommendations:  Limitations on Scope of Treatment: Full Comfort Care  Code Status:  Full code  Prognosis:   Unable to determine  Discharge Planning:  To Be Determined   Thank you for allowing the Palliative Medicine Team to assist in the care of this patient.   Total Time 25min Prolonged Time Billed  no       Greater than 50%  of this time was spent counseling and coordinating care related to the above assessment and plan.  Yong ChannelAlicia Nanci Lakatos, NP Palliative Medicine Team Pager # [phone] (M-F 8a-5p) Team Phone # [phone] (Nights/Weekends)

## 2016-03-27 NOTE — Progress Notes (Signed)
To EP lab by bed.

## 2016-03-27 NOTE — Progress Notes (Signed)
CARDIAC REHAB PHASE I   Second attempt to ambulate with pt this am. Pt declined x2, states he does not think he wants to walk today. Pt states "I feel like I'm breathing harder," also c/o worsening back pain. Pt for procedure today, will plan to follow up tomorrow if unable to see later today. Pt in bed, call bell within reach.  Joylene GrapesEmily C Tanysha Quant, RN, BSN 03/27/2016 10:29 AM

## 2016-03-28 ENCOUNTER — Inpatient Hospital Stay (HOSPITAL_COMMUNITY): Payer: Medicaid Other

## 2016-03-28 DIAGNOSIS — I5023 Acute on chronic systolic (congestive) heart failure: Secondary | ICD-10-CM

## 2016-03-28 LAB — CBC WITH DIFFERENTIAL/PLATELET
BASOS PCT: 1 %
Basophils Absolute: 0 10*3/uL (ref 0.0–0.1)
Eosinophils Absolute: 0.4 10*3/uL (ref 0.0–0.7)
Eosinophils Relative: 6 %
HEMATOCRIT: 30.8 % — AB (ref 39.0–52.0)
HEMOGLOBIN: 10.1 g/dL — AB (ref 13.0–17.0)
Lymphocytes Relative: 18 %
Lymphs Abs: 1.3 10*3/uL (ref 0.7–4.0)
MCH: 30.4 pg (ref 26.0–34.0)
MCHC: 32.8 g/dL (ref 30.0–36.0)
MCV: 92.8 fL (ref 78.0–100.0)
MONOS PCT: 9 %
Monocytes Absolute: 0.7 10*3/uL (ref 0.1–1.0)
NEUTROS ABS: 4.8 10*3/uL (ref 1.7–7.7)
NEUTROS PCT: 66 %
PLATELETS: 290 10*3/uL (ref 150–400)
RBC: 3.32 MIL/uL — ABNORMAL LOW (ref 4.22–5.81)
RDW: 14.5 % (ref 11.5–15.5)
WBC: 7.2 10*3/uL (ref 4.0–10.5)

## 2016-03-28 LAB — BASIC METABOLIC PANEL
Anion gap: 11 (ref 5–15)
BUN: 16 mg/dL (ref 6–20)
CALCIUM: 9 mg/dL (ref 8.9–10.3)
CHLORIDE: 98 mmol/L — AB (ref 101–111)
CO2: 26 mmol/L (ref 22–32)
Creatinine, Ser: 1.09 mg/dL (ref 0.61–1.24)
GFR calc non Af Amer: >60 mL/min (ref >60)
Glucose, Bld: 89 mg/dL (ref 65–99)
Potassium: 4.4 mmol/L (ref 3.5–5.1)
Sodium: 135 mmol/L (ref 135–145)

## 2016-03-28 MED ORDER — LOSARTAN POTASSIUM 25 MG PO TABS
25.0000 mg | ORAL_TABLET | Freq: Every day | ORAL | Status: DC
  Administered 2016-03-28 – 2016-03-30 (×3): 25 mg via ORAL
  Filled 2016-03-28 (×3): qty 1

## 2016-03-28 MED ORDER — BISOPROLOL FUMARATE 5 MG PO TABS
2.5000 mg | ORAL_TABLET | Freq: Every day | ORAL | Status: DC
  Administered 2016-03-28 – 2016-03-29 (×2): 2.5 mg via ORAL
  Filled 2016-03-28 (×2): qty 1

## 2016-03-28 NOTE — Op Note (Signed)
[name], WITZ NO.:  [id number]  MEDICAL RECORD NO.:  [id number]  LOCATION:  [id number]                       FACILITY:  [facility]  PHYSICIAN:  [name], MD, FACCDATE OF BIRTH:  1955-06-16  DATE OF PROCEDURE:  03/27/2016 DATE OF DISCHARGE:                              OPERATIVE REPORT   PREOPERATIVE DIAGNOSES:  Complete heart block, congestive heart failure, cardiomyopathy, ventricular tachycardia.  POSTOPERATIVE DIAGNOSES:  Complete heart block, congestive heart failure, cardiomyopathy, ventricular tachycardia.  PROCEDURE:  Dual-chamber defibrillator implantation with His bundle pacing.  DESCRIPTION OF PROCEDURE:  Following obtaining informed consent, the patient was brought to the electrophysiology laboratory and placed on the fluoroscopic table in the supine position.  After routine prep and drape of the right upper chest, lidocaine was infiltrated in the pectoral groove.  An incision was made and carried down to the prepectoral fascia, electrocautery and sharp dissection.  A pocket was formed similarly hemostasis was obtained.  A contrast venogram was done to demonstrate the course of the extrathoracic right subclavian vein and what I had understood was a right IJ temporary venous temporary permanent pacemaker was in fact in the right subclavian vein.  In any case, we did 3 separate venipunctures.  Guidewires were placed and retained sequentially.  A 9- French sheath and 7-French sheaths were placed which passed _____ Medtronic 62 cm, 69, 35 cm single coil defibrillator lead [id number].  A Medtronic [id number] cm active fixation atrial lead, serial number [id number] and a Medtronic 3830, His bundle lead serial number [id number].  Under fluoroscopic guidance, the RV lead was manipulated to the apex where the bipolar paced R-wave was 4.2 with a pace impedance of 701, a threshold 0.7 V at 0.5 milliseconds.  Current threshold is 1.0 mA, and there is no  diaphragmatic pacing.  At 10 V, the current of injury was brisk.  This lead was secured to the prepectoral fascia.  The right atrial lead was then placed in the appendage where the bipolar P-wave was 1.6 with a pace impedance of 892 with threshold 0.6 V at 0.5 milliseconds.  Current threshold was 0.9 mA.  There is no diaphragmatic pacing at 10 V.  The current of injury was brisk.  This lead was secured to the prepectoral fascia.  We then worked on mapping the His bundle. We were able to find a His signal.  I struggled to deploy the lead in that vicinity.  Remapping sinus in the vicinity of the His and pacing at that location resulted in shortening by 25 milliseconds of the QRS duration from about 245-218.  This was a true in a bipolar as well as a unipolar pacing configuration and was seemingly about optimal with LV pacing somewhere about 20 milliseconds early.  The delivery system was removed.  The lead was secured.  The pocket was copiously irrigated with antibiotic containing saline solution. Hemostasis was assured.  Leads in the pulse generator were placed in the pocket secured to the prepectoral fascia.  Surgicel was placed along the cephalad and lateral aspect of the pocket.  The wound was closed in 3 layers in normal fashion.  The wound was washed dried.  A Steri-Strip dressing  was applied.  Needle counts, sponge counts, and instrument counts were correct at the end of the procedure according to the staff.  Thereafter, we removed the previously implanted temporal pacemaker.  Under fluoroscopic guidance, a stylet was placed.  The lead was unfixed and removed under fluoroscopic guidance.  A dressing was applied.     Duke SalviaSteven C. Klein, MD, [facility] Hospital Of ScrantonFACC     SCK/MEDQ  D:  03/27/2016  T:  03/28/2016  Job:  [id number]

## 2016-03-28 NOTE — Progress Notes (Signed)
Pt educated about bed alarm and safety , however pt refused to be on bed alarm during the night. Will continue to monitor.  Shawnee Higham, RN

## 2016-03-28 NOTE — Progress Notes (Signed)
1 Day Post-Op Procedure(s) (LRB): ICD Implant (N/A) Subjective: Ischemic cardiomyopathy status post infected AICD extraction Left pocket suture line dry, mild edema. New right infraclavicular incision device implant site with edema and some drainage Patient complaining of left SI joint pain and poor ability to ambulate Patient will need to be treated with IV antibiotics for> month, allow the new AICD incision to heal, and to be able to ambulate again before he is an operative candidate for mechanical support Objective: Vital signs in last 24 hours: Temp:  [97.4 F (36.3 C)-98.2 F (36.8 C)] 98.2 F (36.8 C) (02/03 1501) Pulse Rate:  [0-87] 69 (02/03 1501) Cardiac Rhythm: A-V Sequential paced (02/03 1545) Resp:  [0-28] 18 (02/03 1501) BP: (107-206)/(30-102) 119/83 (02/03 1501) SpO2:  [96 %-100 %] 99 % (02/03 1501) Weight:  [205 lb 7.5 oz (93.2 kg)-205 lb 14.6 oz (93.4 kg)] 205 lb 14.6 oz (93.4 kg) (02/03 1501)  Hemodynamic parameters for last 24 hours:    Intake/Output from previous day: 02/02 0701 - 02/03 0700 In: 1230 [P.O.:630; I.V.:400; IV Piggyback:200] Out: 1050 [Urine:1050] Intake/Output this shift: Total I/O In: 100 [IV Piggyback:100] Out: 800 [Urine:800]  Left infra clavicular incision clean and dry  Lab Results:  Recent Labs  03/27/16 0403 03/28/16 0717  WBC 7.4 7.2  HGB 9.9* 10.1*  HCT 29.5* 30.8*  PLT 249 290   BMET:  Recent Labs  03/27/16 1206 03/28/16 0717  NA 138 135  K 4.2 4.4  CL 99* 98*  CO2 28 26  GLUCOSE 106* 89  BUN 16 16  CREATININE 1.18 1.09  CALCIUM 9.1 9.0    PT/INR: No results for input(s): LABPROT, INR in the last 72 hours. ABG    Component Value Date/Time   PHART 7.487 (H) 03/23/2016 2002   HCO3 29.2 (H) 03/23/2016 2002   TCO2 30 03/23/2016 2002   ACIDBASEDEF 4.9 (H) 01/27/2016 1655   O2SAT 98.8 03/26/2016 0615   CBG (last 3)   Recent Labs  03/27/16 1113  GLUCAP 103*    Assessment/Plan: S/P Procedure(s)  (LRB): ICD Implant (N/A) We'll follow   LOS: 11 days    Kathlee Nationseter Van Trigt III 03/28/2016

## 2016-03-28 NOTE — Progress Notes (Signed)
Pt transferred from 4N, vitals stable, get situated in bed, aware of safety precautions, no any specific complain of pain at this point, will continue to monitor the patient.

## 2016-03-28 NOTE — Progress Notes (Signed)
Patient ID: [name], male   DOB: 09-Aug-1955, 61 y.o.   MRN: [id number]     Advanced Heart Failure Rounding Note  Primary Cardiologist: Dr Jens Som Primary HF: Dr. Shirlee Latch   Subjective:    Admitted from ED 03/17/16 with recurrent low output HF with NYHA IIIb-IV symptoms.   He was on milrinone initially with diuresis, now off.  RHC 1/26 looked good.   BCx 03/18/16 2/2 coag negative staph. ICD pocked noted to have serous drainage and exposed ICD. CT abdomen/pelvis did not show evidence for discitis or acute MSK changes Afebrile. WBCs WNL currently.   Repeat BCx 03/24/16 NGTD.   S/p ICD extraction 03/23/16 with placement of Temporary permanent pacemaker with previous pacer dependency due to CHB.   TEE on 1/26 with no evidence of vegetation.   Dual chamber ICD with His bundle pacing placed on 2/2.    This morning doing very well, BP up quite a bit now that he has regained AV synchrony.   RHC Procedural Findings (1/26, off milrinone): Hemodynamics (mmHg) RA mean 1 RV 36/8 PA 36/11, mean 21 PCWP mean 7 Oxygen saturations: PA 62% AO 96% Cardiac Output (Fick) 6.58  Cardiac Index (Fick) 3.06 Cardiac Output (Thermo) 6.62 Cardiac Index (Thermo) 3.08  Objective:   Weight Range: 205 lb 7.5 oz (93.2 kg) Body mass index is 28.66 kg/m.   Vital Signs:   Temp:  [97.4 F (36.3 C)-97.9 F (36.6 C)] 97.9 F (36.6 C) (02/03 0400) Pulse Rate:  [0-208] 70 (02/03 0400) Resp:  [0-28] 11 (02/03 0400) BP: (102-185)/(30-132) 134/55 (02/03 0400) SpO2:  [0 %-100 %] 97 % (02/03 0400) Weight:  [205 lb 7.5 oz (93.2 kg)] 205 lb 7.5 oz (93.2 kg) (02/03 0401) Last BM Date: 03/26/16  Weight change: Filed Weights   03/26/16 0500 03/27/16 0500 03/28/16 0401  Weight: 202 lb 9.6 oz (91.9 kg) 201 lb 11.2 oz (91.5 kg) 205 lb 7.5 oz (93.2 kg)    Intake/Output:   Intake/Output Summary (Last 24 hours) at 03/28/16 0752 Last data filed at 03/28/16 0400  Gross per 24 hour  Intake             1230 ml    Output             1050 ml  Net              180 ml     Physical Exam: General: Elderly appearing. NAD.        HEENT: Normal Neck: supple. JVP 7 cm. Carotids 2+ bilat; no bruits. No thyromegaly or nodule noted.  Cor: PMI nondisplaced. Regular. No M/G/R appreciated. No M/G/R    Lungs: CTAB, normal effort Abdomen: soft, NT, ND, no HSM. No bruits or masses. +BS   Extremities: no cyanosis, clubbing, rash. No edema.     Neuro: alert & oriented x 3, cranial nerves grossly intact. moves all 4 extremities w/o difficulty. Affect pleasant.  Telemetry: Reviewed, A sensed V paced. Occasional NSVT   Labs: CBC  Recent Labs  03/26/16 0328 03/27/16 0403  WBC 6.7 7.4  NEUTROABS 4.0 4.1  HGB 9.7* 9.9*  HCT 29.6* 29.5*  MCV 92.5 93.1  PLT 231 249   Basic Metabolic Panel  Recent Labs  03/26/16 0328 03/27/16 0403 03/27/16 1206  NA 135 135 138  K 4.2 4.0 4.2  CL 99* 98* 99*  CO2 30 28 28   GLUCOSE 106* 103* 106*  BUN 18 18 16   CREATININE 1.16 1.20 1.18  CALCIUM  8.7* 8.9 9.1  MG 2.0  --   --    Liver Function Tests  Recent Labs  03/26/16 0328  AST 16  ALT 19  ALKPHOS 41  BILITOT 0.6  PROT 6.1*  ALBUMIN 2.9*   No results for input(s): LIPASE, AMYLASE in the last 72 hours. Cardiac Enzymes No results for input(s): CKTOTAL, CKMB, CKMBINDEX, TROPONINI in the last 72 hours.  BNP: BNP (last 3 results)  Recent Labs  01/13/16 1008 01/27/16 1155 03/17/16 0311  BNP 256.0* 595.7* 894.5*    ProBNP (last 3 results) No results for input(s): PROBNP in the last 8760 hours.   D-Dimer No results for input(s): DDIMER in the last 72 hours. Hemoglobin A1C No results for input(s): HGBA1C in the last 72 hours. Fasting Lipid Panel No results for input(s): CHOL, HDL, LDLCALC, TRIG, CHOLHDL, LDLDIRECT in the last 72 hours. Thyroid Function Tests No results for input(s): TSH, T4TOTAL, T3FREE, THYROIDAB in the last 72 hours.  Invalid input(s): FREET3  Other  results:  Imaging/Studies:  Dg Chest 2 View  Result Date: 03/28/2016 CLINICAL DATA:  Post pacemaker revision. EXAM: CHEST  2 VIEW COMPARISON:  03/24/2016 and 03/17/2016 radiographs. FINDINGS: Interval revision of right subclavian pacemaker with placement of an AICD. There are 3 leads, extending into the right atrium and right ventricle. The heart size and mediastinal contours are stable. The lungs are clear. There is no pleural effusion or pneumothorax. IMPRESSION: Interval pacemaker/AICD revision without demonstrated complication. Electronically Signed   By: Carey Bullocks M.D.   On: 03/28/2016 07:10    Medications:    Scheduled Medications: . amiodarone  200 mg Oral Daily  . aspirin EC  81 mg Oral Daily  . atorvastatin  40 mg Oral q1800  . bisoprolol  2.5 mg Oral Daily  .  ceFAZolin (ANCEF) IV  2 g Intravenous Q8H  . clopidogrel  75 mg Oral Daily  . digoxin  0.125 mg Oral QODAY  . docusate sodium  100 mg Oral BID  . famotidine  20 mg Oral BID  . feeding supplement (ENSURE ENLIVE)  237 mL Oral BID BM  . furosemide  40 mg Oral Daily  . lidocaine  1 patch Transdermal Q24H  . losartan  25 mg Oral Daily  . magnesium oxide  400 mg Oral Daily  . mexiletine  200 mg Oral Q12H  . nicotine  21 mg Transdermal Daily  . PARoxetine  20 mg Oral Daily  . spironolactone  12.5 mg Oral Daily    Infusions:   PRN Medications: acetaminophen, albuterol, ALPRAZolam, cyclobenzaprine, hydrOXYzine, nitroGLYCERIN, ondansetron (ZOFRAN) IV, oxyCODONE-acetaminophen, sodium chloride flush, traMADol  Assessment/Plan   1. Acute on chronic systolic CHF: Echo 11/2015 LVEF 15%, ischemic cardiomyopathy.  Initially on milrinone and diuresed, now off milrinone and back on po Lasix.  See RHC above => filling pressures optimized and CO preserved off milrinone.  Initial presentation may have been related to Coag negative Staph. Have begun LVAD work up. TEE 1/26; LVEF 15% mod-severe MR. He developed orthostatic  hypotension after dual chamber ICD removed and temp-perm pacer placed, suspect due to loss of AV synchrony.  Now s/p dual chamber ICD with His bundle pacing, he is a-v sequentially paced and BP is much higher.  No complaints today.  - Continue Lasix 40 mg po daily.    - Continue spironolactone and digoxin. - Restart home losartan and bisoprolol.  - On going work up for LVAD. May eventually be transplant candidate. Just stopped smoking PTA. VAD  coordinator and Dr. Laneta SimmersBartle have seen. HFSW to plan family meeting.  2. CAD: Recent LHC in 12/17 with stable coronary disease  - Continue statin, ASA, Plavix.   3. Ascending aortic aneurysm: 4.1 cm 8/17. No evidence of dissection on CTA 03/17/16.  4. VT storm in 12/17: Continue amiodarone and Mexiletine.  - ICE has been re-implanted. 5. Low back pain, Chronic: Ortho has seen. Suspect MSK.  No discitis/Abscess on CT abdomen/pelvis.  - Continue flexeril and tramadol prn.  6. 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  - s/p ICD extraction due to infected pocked.  - Now has had device replaced.  7. ID: Blood cultures (done for back pain, ?discitis given concerns about ICD pocket) returned 2/2 coag negative Staph.  ICD eroded with drainage from site.  - s/p Extraction 03/23/16 and device replacement on 2/2 as above.  - Continue cefazolin IV per ID. Will be on until 04/19/16 (will need PICC, place day of discharge) Blood cultures re-drawn 1/25. NGTD.  - TEE 1/26 no vegetation.   To telemetry today, observe over weekend, home on Monday.   Marca Anconaalton Tranae Laramie, MD  03/28/2016 7:52 AM

## 2016-03-28 NOTE — Progress Notes (Signed)
CARDIAC REHAB PHASE I   PRE:  Rate/Rhythm: V paced 70  BP:  Supine: 148/63       SaO2: 98% room air  MODE:  Ambulation: none patient pivoted to rollator the assisted to chair   POST:  Rate/Rhythem: V paced 73  BP:    Sitting: 185/99     SaO2: 98% room air  1035-1115 Attempted to ambulate [name] this morning. Patient reports having back pain that makes it unable to put pressure on his left leg. " I won't be able to walk. Can you help me get to the chair. Patient transferred to the rollator chair and rolled and placed into the  recliner. Patient's RN notified about patient's elevated blood pressure post transfer. Call bell within reach.  Thayer HeadingsMaria Walden Whitaker RN BSN

## 2016-03-28 NOTE — Progress Notes (Signed)
Patient Name: [name] Date of Encounter: 03/28/2016     Active Problems:   Acute on chronic systolic congestive heart failure (HCC)   Bacteremia   Palliative care encounter    SUBJECTIVE  Feels better. Still with some back pain.  CURRENT MEDS . amiodarone  200 mg Oral Daily  . aspirin EC  81 mg Oral Daily  . atorvastatin  40 mg Oral q1800  . bisoprolol  2.5 mg Oral Daily  .  ceFAZolin (ANCEF) IV  2 g Intravenous Q8H  . clopidogrel  75 mg Oral Daily  . digoxin  0.125 mg Oral QODAY  . docusate sodium  100 mg Oral BID  . famotidine  20 mg Oral BID  . feeding supplement (ENSURE ENLIVE)  237 mL Oral BID BM  . furosemide  40 mg Oral Daily  . lidocaine  1 patch Transdermal Q24H  . losartan  25 mg Oral Daily  . magnesium oxide  400 mg Oral Daily  . mexiletine  200 mg Oral Q12H  . nicotine  21 mg Transdermal Daily  . PARoxetine  20 mg Oral Daily  . spironolactone  12.5 mg Oral Daily    OBJECTIVE  Vitals:   03/28/16 0200 03/28/16 0300 03/28/16 0400 03/28/16 0401  BP: 130/64 (!) 159/63 (!) 134/55   Pulse: 68 71 70   Resp: 10 14 11    Temp:   97.9 F (36.6 C)   TempSrc:   Oral   SpO2: 98% 98% 97%   Weight:    205 lb 7.5 oz (93.2 kg)  Height:        Intake/Output Summary (Last 24 hours) at 03/28/16 0807 Last data filed at 03/28/16 0400  Gross per 24 hour  Intake             1180 ml  Output             1050 ml  Net              130 ml   Filed Weights   03/26/16 0500 03/27/16 0500 03/28/16 0401  Weight: 202 lb 9.6 oz (91.9 kg) 201 lb 11.2 oz (91.5 kg) 205 lb 7.5 oz (93.2 kg)    PHYSICAL EXAM  General: Pleasant, chronically ill appearing, NAD. Neuro: Alert and oriented X 3. Moves all extremities spontaneously. Psych: Normal affect. HEENT:  Normal with beard  Neck: Supple without bruits or JVD. Lungs:  Resp regular and unlabored, CTA. Bilateral incisions clean and dry Heart: RRR no s3, s4, or murmurs. Abdomen: Soft, non-tender, non-distended, BS + x 4.    Extremities: No clubbing, cyanosis or edema. DP/PT/Radials 2+ and equal bilaterally.  Accessory Clinical Findings  CBC  Recent Labs  03/26/16 0328 03/27/16 0403  WBC 6.7 7.4  NEUTROABS 4.0 4.1  HGB 9.7* 9.9*  HCT 29.6* 29.5*  MCV 92.5 93.1  PLT 231 249   Basic Metabolic Panel  Recent Labs  03/26/16 0328 03/27/16 0403 03/27/16 1206  NA 135 135 138  K 4.2 4.0 4.2  CL 99* 98* 99*  CO2 30 28 28   GLUCOSE 106* 103* 106*  BUN 18 18 16   CREATININE 1.16 1.20 1.18  CALCIUM 8.7* 8.9 9.1  MG 2.0  --   --    Liver Function Tests  Recent Labs  03/26/16 0328  AST 16  ALT 19  ALKPHOS 41  BILITOT 0.6  PROT 6.1*  ALBUMIN 2.9*   No results for input(s): LIPASE, AMYLASE in the last  72 hours. Cardiac Enzymes No results for input(s): CKTOTAL, CKMB, CKMBINDEX, TROPONINI in the last 72 hours. BNP Invalid input(s): POCBNP D-Dimer No results for input(s): DDIMER in the last 72 hours. Hemoglobin A1C No results for input(s): HGBA1C in the last 72 hours. Fasting Lipid Panel No results for input(s): CHOL, HDL, LDLCALC, TRIG, CHOLHDL, LDLDIRECT in the last 72 hours. Thyroid Function Tests No results for input(s): TSH, T4TOTAL, T3FREE, THYROIDAB in the last 72 hours.  Invalid input(s): FREET3  TELE  nsr with Rv pacing  Radiology/Studies  Dg Chest 2 View  Result Date: 03/28/2016 CLINICAL DATA:  Post pacemaker revision. EXAM: CHEST  2 VIEW COMPARISON:  03/24/2016 and 03/17/2016 radiographs. FINDINGS: Interval revision of right subclavian pacemaker with placement of an AICD. There are 3 leads, extending into the right atrium and right ventricle. The heart size and mediastinal contours are stable. The lungs are clear. There is no pleural effusion or pneumothorax. IMPRESSION: Interval pacemaker/AICD revision without demonstrated complication. Electronically Signed   By: Carey Bullocks M.D.   On: 03/28/2016 07:10   Dg Chest 2 View  Result Date: 03/24/2016 CLINICAL DATA:  Pacer  removal EXAM: CHEST  2 VIEW COMPARISON:  7 days ago FINDINGS: Single chamber pacer from the right with lead in the expected location of the right ventricle. Left-sided pacer has been removed. Cardiopericardial size is stable. Cardiomegaly and coronary stent. Low volume chest without pneumothorax. Chest wall emphysema on the left, presumably related to dissection of generator pack. IMPRESSION: 1. Stable cardiopericardial size after left-sided pacer removal. Left chest wall gas, no pneumothorax. 2. New right-sided pacer with right ventricular lead. 3. Low volume chest. Electronically Signed   By: Marnee Spring M.D.   On: 03/24/2016 08:39   Dg Chest 2 View  Result Date: 03/17/2016 CLINICAL DATA:  Chest and lower back pain. History of 3 myocardial infarctions with stents. EXAM: CHEST  2 VIEW COMPARISON:  01/29/2016 CXR FINDINGS: Left-sided AICD device with coronary sinus, right atrial and right ventricular leads present. Cardiomegaly is identified with coronary arterial stenting. No aortic aneurysm. Chronic mild interstitial prominence without pneumonic consolidation, effusion or pneumothorax. Mild apical pleuroparenchymal thickening and scarring. No acute osseous abnormality. IMPRESSION: Cardiomegaly without acute pulmonary disease. Electronically Signed   By: Tollie Eth M.D.   On: 03/17/2016 03:53   Dg Lumbar Spine 2-3 Views  Result Date: 03/18/2016 CLINICAL DATA:  Acute low back pain without known injury. EXAM: LUMBAR SPINE - 2-3 VIEW COMPARISON:  CT scan of December 16, 2015. FINDINGS: No fracture or spondylolisthesis is noted. Atherosclerosis of abdominal aorta is noted. Mild degenerative disc disease is noted at L3-4. Remaining disc spaces appear intact. IMPRESSION: Mild degenerative disc disease at L3-4. No acute abnormality seen in the lumbar spine. Aortic atherosclerosis. Electronically Signed   By: Lupita Raider, M.D.   On: 03/18/2016 11:36   Dg Chest Port 1 View  Result Date:  03/17/2016 CLINICAL DATA:  PICC line placement EXAM: PORTABLE CHEST 1 VIEW COMPARISON:  03/17/2016 FINDINGS: The visualized bony structures of the thorax are intact. 2054 hours. Right PICC line tip overlies the distal SVC level. The cardio pericardial silhouette is enlarged. The lungs are clear wiithout focal pneumonia, edema, pneumothorax or pleural effusion. Left pacer/AICD again noted. IMPRESSION: 1. Right PICC line tip overlies the distal SVC. 2. Cardiomegaly. Electronically Signed   By: Kennith Center M.D.   On: 03/17/2016 21:05   Ct Angio Chest/abd/pel For Dissection W And/or Wo Contrast  Result Date: 03/17/2016 CLINICAL DATA:  Right-sided chest pain EXAM: CT ANGIOGRAPHY CHEST, ABDOMEN AND PELVIS TECHNIQUE: Multidetector CT imaging through the chest, abdomen and pelvis was performed using the standard protocol during bolus administration of intravenous contrast. Multiplanar reconstructed images and MIPs were obtained and reviewed to evaluate the vascular anatomy. CONTRAST:  100 mL Isovue 370. COMPARISON:  Chest x-ray from earlier in the same day, 12/16/2015, 10/18/2015 FINDINGS: CTA CHEST FINDINGS Cardiovascular: The thoracic aorta shows mild atherosclerotic calcifications without aneurysmal dilatation or dissection. Pulmonary artery shows a normal branching pattern without evidence of pulmonary embolism. Coronary calcifications and prior coronary stenting is noted. A defibrillator is again noted. Cardiac structures are mildly enlarged but stable. Multiple chest wall venous collaterals are identified likely related to stenosis of the left subclavian vein from the defibrillator. Mediastinum/Nodes: Thoracic inlet is within normal limits. Stable mediastinal adenopathy is noted in the right peritracheal and subcarinal region with the largest of these in the right peritracheal region measuring 16 mm in short axis which is stable from the prior exam. Scattered small hilar lymph nodes are noted also stable from  the previous study. The esophagus as visualize is within normal limits. Lungs/Pleura: Mild emphysematous changes are noted. No focal infiltrate or sizable effusion is seen. Mild peribronchial thickening is noted which is stable from the prior exam likely of a chronic nature. No acute abnormality is seen. Musculoskeletal: Degenerative changes of the thoracic spine are noted. No acute bony abnormality is seen. Review of the MIP images confirms the above findings. CTA ABDOMEN AND PELVIS FINDINGS VASCULAR Aorta: The abdominal aorta demonstrates atherosclerotic calcifications without aneurysmal dilatation or evidence of dissection. Celiac: Mild atherosclerotic calcifications are noted at the origin although the celiac axis is widely patent. SMA: Mild atherosclerotic changes are noted at the origin although the superior mesenteric artery is patent. Renals: Single renal arteries are identified bilaterally with mild atherosclerotic calcifications. No focal hemodynamically significant stenosis is noted. IMA: Widely patent Iliacs: Mild atherosclerotic changes are seen without aneurysmal dilatation or dissection. Veins: Although not timed for venous evaluation no definitive abnormality is seen. Review of the MIP images confirms the above findings. NON-VASCULAR Hepatobiliary: Multiple hypodensities are again identified within the liver consistent with hepatic cysts. These are stable from previous exams. The gallbladder has been surgically removed. Pancreas: Unremarkable. No pancreatic ductal dilatation or surrounding inflammatory changes. Spleen: Normal in size without focal abnormality. Adrenals/Urinary Tract: The adrenal glands are within normal limits. Kidneys are well visualized bilaterally without evidence of renal mass or calcification. No obstructive changes are seen. The bladder is well distended. Stomach/Bowel: Scattered diverticular changes are noted throughout the colon. The appendix is well visualized and within  normal limits. No obstructive changes are seen. No inflammatory changes are identified. Lymphatic: No significant lymphadenopathy is identified. Reproductive: Prostate is unremarkable. Other: No abdominal wall hernia or abnormality. No abdominopelvic ascites. Musculoskeletal: No acute or significant osseous findings. Review of the MIP images confirms the above findings. IMPRESSION: CTA of the chest: No evidence of aortic dissection or aneurysmal dilatation. No definitive pulmonary emboli are identified. Stable mediastinal and hilar lymph nodes likely reactive in nature. Mild changes of bronchitis are noted as well. 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 CTA of the abdomen and pelvis. No aneurysmal dilatation or dissection is noted. Scattered chronic changes similar to that noted on previous exams. Electronically Signed   By: Alcide CleverMark  Lukens M.D.   On: 03/17/2016 08:33    ASSESSMENT AND PLAN  1. ICD system infection - he  is s/p very difficult extraction. He will need sutures removed in 7-10 days. 2. New ICD system insertion - his device has been interrogated under my direction. All leads are working normally. CXR looks good. 3. Acute on chronic systolic heart failure - he was doing poorly until his device insertion. Now BP much improved. 4. Staph bacteremia - he will continue IV anti-biotics. Would hope to place the PICC line on the left side.  Sharlot Gowda Taylor,M.D.  03/28/2016 8:07 AMPatient ID: [name], male   DOB: [birth year], 61 y.o.   MRN: [id number]

## 2016-03-29 LAB — CULTURE, BLOOD (ROUTINE X 2)
CULTURE: NO GROWTH
Culture: NO GROWTH

## 2016-03-29 LAB — BASIC METABOLIC PANEL
Anion gap: 8 (ref 5–15)
BUN: 19 mg/dL (ref 6–20)
CHLORIDE: 100 mmol/L — AB (ref 101–111)
CO2: 28 mmol/L (ref 22–32)
CREATININE: 1.04 mg/dL (ref 0.61–1.24)
Calcium: 9.1 mg/dL (ref 8.9–10.3)
GFR calc Af Amer: >60 mL/min (ref >60)
GFR calc non Af Amer: >60 mL/min (ref >60)
Glucose, Bld: 91 mg/dL (ref 65–99)
Potassium: 4.4 mmol/L (ref 3.5–5.1)
SODIUM: 136 mmol/L (ref 135–145)

## 2016-03-29 LAB — CBC WITH DIFFERENTIAL/PLATELET
Basophils Absolute: 0 10*3/uL (ref 0.0–0.1)
Basophils Relative: 1 %
EOS ABS: 0.5 10*3/uL (ref 0.0–0.7)
EOS PCT: 7 %
HCT: 30.5 % — ABNORMAL LOW (ref 39.0–52.0)
Hemoglobin: 10.1 g/dL — ABNORMAL LOW (ref 13.0–17.0)
LYMPHS ABS: 2 10*3/uL (ref 0.7–4.0)
Lymphocytes Relative: 26 %
MCH: 30.9 pg (ref 26.0–34.0)
MCHC: 33.1 g/dL (ref 30.0–36.0)
MCV: 93.3 fL (ref 78.0–100.0)
MONOS PCT: 12 %
Monocytes Absolute: 0.9 10*3/uL (ref 0.1–1.0)
Neutro Abs: 4.1 10*3/uL (ref 1.7–7.7)
Neutrophils Relative %: 54 %
PLATELETS: 296 10*3/uL (ref 150–400)
RBC: 3.27 MIL/uL — AB (ref 4.22–5.81)
RDW: 14.4 % (ref 11.5–15.5)
WBC: 7.5 10*3/uL (ref 4.0–10.5)

## 2016-03-29 MED ORDER — BISOPROLOL FUMARATE 5 MG PO TABS
5.0000 mg | ORAL_TABLET | Freq: Every day | ORAL | Status: DC
  Administered 2016-03-30: 5 mg via ORAL
  Filled 2016-03-29: qty 1

## 2016-03-29 MED ORDER — SPIRONOLACTONE 25 MG PO TABS
25.0000 mg | ORAL_TABLET | Freq: Every day | ORAL | Status: DC
  Administered 2016-03-30: 25 mg via ORAL
  Filled 2016-03-29: qty 1

## 2016-03-29 NOTE — Progress Notes (Signed)
Patient with no complaints or concerns during 7pm - 7am shift. Alert and oriented, slept during the night. Dressings are clean, no swelling or any issues around incision area.   Ambreen Tufte, RN

## 2016-03-29 NOTE — Progress Notes (Signed)
Patient ID: [name], male   DOB: 06/23/1955, 61 y.o.   MRN: [id number]     Advanced Heart Failure Rounding Note  Primary Cardiologist: Dr Jens Somrenshaw Primary HF: [name]   Subjective:    Admitted from ED 03/17/16 with recurrent low output HF with NYHA IIIb-IV symptoms.   He was on milrinone initially with diuresis, now off.  RHC 1/26 looked good.   BCx 03/18/16 2/2 coag negative staph. ICD pocked noted to have serous drainage and exposed ICD. CT abdomen/pelvis did not show evidence for discitis or acute MSK changes Afebrile. WBCs WNL currently.   Repeat BCx 03/24/16 NGTD.   S/p ICD extraction 03/23/16 with placement of Temporary permanent pacemaker with previous pacer dependency due to CHB.   TEE on 1/26 with no evidence of vegetation.   Dual chamber ICD with His bundle pacing placed on 2/2.    OK today, BP higher with AV synchrony and His bundle pacing.  Still with low back pain, harder to get out of bed with arm in sling.   RHC Procedural Findings (1/26, off milrinone): Hemodynamics (mmHg) RA mean 1 RV 36/8 PA 36/11, mean 21 PCWP mean 7 Oxygen saturations: PA 62% AO 96% Cardiac Output (Fick) 6.58  Cardiac Index (Fick) 3.06 Cardiac Output (Thermo) 6.62 Cardiac Index (Thermo) 3.08  Objective:   Weight Range: 200 lb 14.4 oz (91.1 kg) Body mass index is 28.02 kg/m.   Vital Signs:   Temp:  [97.3 F (36.3 C)-98.2 F (36.8 C)] 97.3 F (36.3 C) (02/04 0453) Pulse Rate:  [69-86] 73 (02/04 0510) Resp:  [13-19] 16 (02/04 0453) BP: (100-206)/(46-102) 150/57 (02/04 0510) SpO2:  [96 %-100 %] 96 % (02/04 0510) Weight:  [200 lb 14.4 oz (91.1 kg)-205 lb 14.6 oz (93.4 kg)] 200 lb 14.4 oz (91.1 kg) (02/04 0453) Last BM Date: 03/27/16  Weight change: Filed Weights   03/28/16 0401 03/28/16 1501 03/29/16 0453  Weight: 205 lb 7.5 oz (93.2 kg) 205 lb 14.6 oz (93.4 kg) 200 lb 14.4 oz (91.1 kg)    Intake/Output:   Intake/Output Summary (Last 24 hours) at 03/29/16 1054 Last  data filed at 03/29/16 0839  Gross per 24 hour  Intake              930 ml  Output             1800 ml  Net             -870 ml     Physical Exam: General: Elderly appearing. NAD.        HEENT: Normal Neck: supple. JVP 7 cm. Carotids 2+ bilat; no bruits. No thyromegaly or nodule noted.  Cor: PMI nondisplaced. Regular. No M/G/R appreciated. No M/G/R    Lungs: CTAB, normal effort Abdomen: soft, NT, ND, no HSM. No bruits or masses. +BS   Extremities: no cyanosis, clubbing, rash. No edema.     Neuro: alert & oriented x 3, cranial nerves grossly intact. moves all 4 extremities w/o difficulty. Affect pleasant.  Telemetry: Reviewed, A sensed V paced. Occasional NSVT   Labs: CBC  Recent Labs  03/28/16 0717 03/29/16 0417  WBC 7.2 7.5  NEUTROABS 4.8 4.1  HGB 10.1* 10.1*  HCT 30.8* 30.5*  MCV 92.8 93.3  PLT 290 296   Basic Metabolic Panel  Recent Labs  03/28/16 0717 03/29/16 0417  NA 135 136  K 4.4 4.4  CL 98* 100*  CO2 26 28  GLUCOSE 89 91  BUN 16 19  CREATININE 1.09 1.04  CALCIUM 9.0 9.1   Liver Function Tests No results for input(s): AST, ALT, ALKPHOS, BILITOT, PROT, ALBUMIN in the last 72 hours. No results for input(s): LIPASE, AMYLASE in the last 72 hours. Cardiac Enzymes No results for input(s): CKTOTAL, CKMB, CKMBINDEX, TROPONINI in the last 72 hours.  BNP: BNP (last 3 results)  Recent Labs  01/13/16 1008 01/27/16 1155 03/17/16 0311  BNP 256.0* 595.7* 894.5*    ProBNP (last 3 results) No results for input(s): PROBNP in the last 8760 hours.   D-Dimer No results for input(s): DDIMER in the last 72 hours. Hemoglobin A1C No results for input(s): HGBA1C in the last 72 hours. Fasting Lipid Panel No results for input(s): CHOL, HDL, LDLCALC, TRIG, CHOLHDL, LDLDIRECT in the last 72 hours. Thyroid Function Tests No results for input(s): TSH, T4TOTAL, T3FREE, THYROIDAB in the last 72 hours.  Invalid input(s): FREET3  Other  results:  Imaging/Studies:  No results found.  Medications:    Scheduled Medications: . amiodarone  200 mg Oral Daily  . aspirin EC  81 mg Oral Daily  . atorvastatin  40 mg Oral q1800  . [START ON 03/30/2016] bisoprolol  5 mg Oral Daily  .  ceFAZolin (ANCEF) IV  2 g Intravenous Q8H  . clopidogrel  75 mg Oral Daily  . digoxin  0.125 mg Oral QODAY  . docusate sodium  100 mg Oral BID  . famotidine  20 mg Oral BID  . feeding supplement (ENSURE ENLIVE)  237 mL Oral BID BM  . furosemide  40 mg Oral Daily  . lidocaine  1 patch Transdermal Q24H  . losartan  25 mg Oral Daily  . magnesium oxide  400 mg Oral Daily  . mexiletine  200 mg Oral Q12H  . nicotine  21 mg Transdermal Daily  . PARoxetine  20 mg Oral Daily  . [START ON 03/30/2016] spironolactone  25 mg Oral Daily    Infusions:   PRN Medications: acetaminophen, albuterol, ALPRAZolam, cyclobenzaprine, hydrOXYzine, nitroGLYCERIN, ondansetron (ZOFRAN) IV, oxyCODONE-acetaminophen, sodium chloride flush, traMADol  Assessment/Plan   1. Acute on chronic systolic CHF: Echo 11/2015 LVEF 15%, ischemic cardiomyopathy.  Initially on milrinone and diuresed, now off milrinone and back on po Lasix.  See RHC above => filling pressures optimized and CO preserved off milrinone.  Initial presentation may have been related to Coag negative Staph. Have begun LVAD work up. TEE 1/26; LVEF 15% mod-severe MR. He developed orthostatic hypotension after dual chamber ICD removed and temp-perm pacer placed, suspect due to loss of AV synchrony.  Now s/p dual chamber ICD with His bundle pacing, he is a-v sequentially paced and BP is higher.  Volume looks ok.  - Continue Lasix 40 mg po daily.    - Continue digoxin and losartan. - Can increase bisoprolol to 5 mg daily and spironolactone to 25 mg daily.  - On going work up for LVAD. May eventually be transplant candidate. Just stopped smoking PTA. VAD coordinator and Dr. Laneta Simmers have seen. 2. CAD: Recent LHC in 12/17  with stable coronary disease  - Continue statin, ASA, Plavix.   3. Ascending aortic aneurysm: 4.1 cm 8/17. No evidence of dissection on CTA 03/17/16.  4. VT storm in 12/17: Continue amiodarone and Mexiletine.  - ICD has been re-implanted. 5. Low back pain, Chronic: Ortho has seen. Suspect MSK.  No discitis/Abscess on CT abdomen/pelvis. Pain continues to be an issue. - Work with PT.  - Continue flexeril and tramadol prn.  6. Heart block:  History of high grade HB with bradycardia.  He is pacer-dependent.  - s/p ICD extraction due to infected pocked.  - Now has had device replaced.  7. ID: Blood cultures (done for back pain, ?discitis given concerns about ICD pocket) returned 2/2 coag negative Staph.  ICD eroded with drainage from site.  - s/p Extraction 03/23/16 and device replacement on 2/2 as above.  - Continue cefazolin IV per ID. Will be on until 04/19/16 (will need PICC, place day of discharge.) Blood cultures re-drawn 1/25. NGTD.  - TEE 1/26 no vegetation.   Will likely go home tomorrow, will need PICC.    Marca Ancona, MD  03/29/2016 10:54 AM

## 2016-03-29 NOTE — Progress Notes (Signed)
RN notified that PICC will not be done today. 

## 2016-03-30 ENCOUNTER — Encounter (HOSPITAL_COMMUNITY): Payer: Self-pay | Admitting: Internal Medicine

## 2016-03-30 DIAGNOSIS — T827XXA Infection and inflammatory reaction due to other cardiac and vascular devices, implants and grafts, initial encounter: Secondary | ICD-10-CM

## 2016-03-30 DIAGNOSIS — R42 Dizziness and giddiness: Secondary | ICD-10-CM

## 2016-03-30 LAB — CBC WITH DIFFERENTIAL/PLATELET
BASOS PCT: 0 %
Basophils Absolute: 0 10*3/uL (ref 0.0–0.1)
Eosinophils Absolute: 0.5 10*3/uL (ref 0.0–0.7)
Eosinophils Relative: 7 %
HEMATOCRIT: 31.4 % — AB (ref 39.0–52.0)
HEMOGLOBIN: 10.4 g/dL — AB (ref 13.0–17.0)
LYMPHS PCT: 23 %
Lymphs Abs: 1.5 10*3/uL (ref 0.7–4.0)
MCH: 30.7 pg (ref 26.0–34.0)
MCHC: 33.1 g/dL (ref 30.0–36.0)
MCV: 92.6 fL (ref 78.0–100.0)
MONO ABS: 0.6 10*3/uL (ref 0.1–1.0)
Monocytes Relative: 9 %
NEUTROS ABS: 4.1 10*3/uL (ref 1.7–7.7)
NEUTROS PCT: 61 %
Platelets: 325 10*3/uL (ref 150–400)
RBC: 3.39 MIL/uL — ABNORMAL LOW (ref 4.22–5.81)
RDW: 14.6 % (ref 11.5–15.5)
WBC: 6.8 10*3/uL (ref 4.0–10.5)

## 2016-03-30 LAB — BASIC METABOLIC PANEL
ANION GAP: 6 (ref 5–15)
BUN: 18 mg/dL (ref 6–20)
CHLORIDE: 101 mmol/L (ref 101–111)
CO2: 28 mmol/L (ref 22–32)
Calcium: 9 mg/dL (ref 8.9–10.3)
Creatinine, Ser: 1.13 mg/dL (ref 0.61–1.24)
GFR calc non Af Amer: >60 mL/min (ref >60)
GLUCOSE: 96 mg/dL (ref 65–99)
POTASSIUM: 4.1 mmol/L (ref 3.5–5.1)
Sodium: 135 mmol/L (ref 135–145)

## 2016-03-30 MED ORDER — SPIRONOLACTONE 25 MG PO TABS
12.5000 mg | ORAL_TABLET | Freq: Every day | ORAL | Status: DC
Start: 2016-03-31 — End: 2016-03-30

## 2016-03-30 MED ORDER — BISOPROLOL FUMARATE 5 MG PO TABS
2.5000 mg | ORAL_TABLET | Freq: Every day | ORAL | Status: DC
  Administered 2016-03-31: 2.5 mg via ORAL
  Filled 2016-03-30: qty 1

## 2016-03-30 MED FILL — Heparin Sodium (Porcine) 2 Unit/ML in Sodium Chloride 0.9%: INTRAMUSCULAR | Qty: 500 | Status: AC

## 2016-03-30 NOTE — Care Management Note (Signed)
Case Management Note  Patient Details  Name: [name] LuriaLewis [name] An MRN: [id number] Date of Birth: 12/24/1955             Action/Plan: Patient is refusing SNF placement and [facility] chose [facility]; Jeri ModenaPam Kayliana Codd RN [facility] made aware;  Expected Discharge Date:   possibly 03/31/2016               [facility]  In-House Referral:   SW  Discharge planning Services  CM Consult  Choice offered to:  Patient  HH Arranged:  RN, PT [facility]  [facility]  Status of Service:  In process, will continue to follow  Reola MosherChandler, Lianna Sitzmann L, RN,MHA,BSN [phone] 03/30/2016, 3:26 PM

## 2016-03-30 NOTE — Progress Notes (Signed)
Peripherally Inserted Central Catheter/Midline Placement  The IV Nurse has discussed with the patient and/or persons authorized to consent for the patient, the purpose of this procedure and the potential benefits and risks involved with this procedure.  The benefits include less needle sticks, lab draws from the catheter, and the patient may be discharged home with the catheter. Risks include, but not limited to, infection, bleeding, blood clot (thrombus formation), and puncture of an artery; nerve damage and irregular heartbeat and possibility to perform a PICC exchange if needed/ordered by physician.  Alternatives to this procedure were also discussed.  Bard Power PICC patient education guide, fact sheet on infection prevention and patient information card has been provided to patient /or left at bedside.    PICC/Midline Placement Documentation        [name], [name] Jeanette 03/30/2016, 2:51 PM

## 2016-03-30 NOTE — Clinical Social Work Note (Signed)
Patient states that SNF is not an option for him right now. Patient would have to be at SNF for a minimum of 30 days for Medicaid to pay for it. Patient states his MD told him he would only need 7 days. Patient appreciated social work intervention.  Will notify RNCM. CSW signing off.  Charlynn CourtSarah Ezinne Yogi, CSW [phone]

## 2016-03-30 NOTE — Progress Notes (Signed)
[facility] is following for home IV antibiotic therapy; Alexis GoodellB Batul Diego RN,MHA,BSN [phone]

## 2016-03-30 NOTE — Progress Notes (Signed)
Occupational Therapy Treatment Patient Details Name: [name] MRN: [id number] DOB: February 09, 1956 Today's Date: 03/30/2016    History of present illness Pt is a 61 y/o male admitted secondary to worsening SOB, acute CHF exacerbation. Pt with infected pacemaker s/p excision with temporary pacer placed. Planned AICD implantation 03/27/16. 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 s/p cardioversion 03/20/16.    OT comments  Pt completed sponge bath with min assist for feet due to fatigue and min guard assist for standing. Progressing steadily.  Follow Up Recommendations  SNF    Equipment Recommendations       Recommendations for Other Services      Precautions / Restrictions Precautions Precautions: ICD/Pacemaker Precaution Comments: pt aware of precautions and able to recall Restrictions Weight Bearing Restrictions: No       Mobility Bed Mobility               General bed mobility comments: pt in chair following cardiac rehab  Transfers Overall transfer level: Needs assistance Equipment used: Rolling walker (2 wheeled) Transfers: Sit to/from Stand Sit to Stand: Min guard         General transfer comment: increased time, cues for technique    Balance Overall balance assessment: Needs assistance   Sitting balance-Leahy Scale: Good       Standing balance-Leahy Scale: Fair                     ADL Overall ADL's : Needs assistance/impaired     Grooming: Wash/dry hands;Wash/dry face;Sitting   Upper Body Bathing: Set up;Supervision/ safety;Sitting   Lower Body Bathing: Minimal assistance;Sit to/from stand Lower Body Bathing Details (indicate cue type and reason): due to fatigue Upper Body Dressing : Set up;Supervision/safety;Sitting   Lower Body Dressing: Min guard;Sit to/from stand Lower Body Dressing Details (indicate cue type and reason): no difficulty donning socks Toilet Transfer:  Ambulation;BSC;RW;Min guard (over toilet) Toilet Transfer Details (indicate cue type and reason): placed 3 in 1 over toilet Toileting- Clothing Manipulation and Hygiene: Min guard;Sit to/from stand       Functional mobility during ADLs: Min guard;Rolling walker General ADL Comments: pt without complaints of dizziness      Vision                     Perception     Praxis      Cognition   Behavior During Therapy: WFL for tasks assessed/performed Overall Cognitive Status: Within Functional Limits for tasks assessed                       Extremity/Trunk Assessment               Exercises     Shoulder Instructions       General Comments      Pertinent Vitals/ Pain       Pain Assessment: Faces Faces Pain Scale: Hurts little more Pain Location: lower back Pain Descriptors / Indicators: Aching;Grimacing Pain Intervention(s): Repositioned  Home Living Family/patient expects to be discharged to:: Private residence                                        Prior Functioning/Environment              Frequency  Min 2X/week  Progress Toward Goals  OT Goals(current goals can now be found in the care plan section)  Progress towards OT goals: Progressing toward goals  Acute Rehab OT Goals Patient Stated Goal: feel better Time For Goal Achievement: 04/02/16 Potential to Achieve Goals: Good  Plan Discharge plan remains appropriate    Co-evaluation                 End of Session Equipment Utilized During Treatment: Rolling walker   Activity Tolerance Patient tolerated treatment well   Patient Left in chair;with call bell/phone within reach   Nurse Communication          Time: [id number] OT Time Calculation (min): 31 min  Charges: OT General Charges $OT Visit: 1 Procedure OT Treatments $Self Care/Home Management : 23-37 mins  [name], [name] 03/30/2016, 10:20 AM  [phone]

## 2016-03-30 NOTE — Progress Notes (Signed)
Changed patients dressing over pacemaker and applied betadine per physicians order.

## 2016-03-30 NOTE — Progress Notes (Addendum)
Physical Therapy Treatment Patient Details Name: [name] MRN: [id number] DOB: 09/22/1955 Today's Date: 03/30/2016    History of Present Illness Pt is a 61 y/o male admitted secondary to worsening SOB, acute CHF exacerbation. Pt with infected pacemaker s/p excision with temporary pacer placed. Planned AICD implantation 03/27/16. 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 s/p cardioversion 03/20/16.     PT Comments    Patient seen for mobility progression and therapeutic activities. Patient with limited activity tolerance at this time due to positive orthostatic hypotension (nsg aware and present at bedside).  Formal orthostatic VS taken. Supine 106/53 hr 69; sitting 69/42 hr 68; standing 78/55 hr 61; 3 min standing 76/51 hr 57. Therapist also performed techniques for management of low back pain.   At this time, remained concerned for patients ability to mobilize safely and care for himself given precautions and need for intermittent assist during simple task performance. Additionally, patient with inaccessible home environment and has not been able to perform stair negotiation at this time. Continue to recommend ST SNF. Will follow.  Follow Up Recommendations  SNF;Supervision - Intermittent     Equipment Recommendations  Rolling walker with 5" wheels    Recommendations for Other Services       Precautions / Restrictions Precautions Precautions: ICD/Pacemaker Precaution Comments: pt aware of precautions and able to recall Restrictions Weight Bearing Restrictions: No    Mobility  Bed Mobility Overal bed mobility: Needs Assistance Bed Mobility: Supine to Sit;Sit to Supine     Supine to sit: Min assist Sit to supine: Min guard   General bed mobility comments: mulimodal cues for compliance with pacer precautions, assist to elevate to upright   Transfers Overall transfer level: Needs assistance Equipment used:  Rolling walker (2 wheeled) Transfers: Sit to/from Stand Sit to Stand: Min guard         General transfer comment: increased time, cues for technique  Ambulation/Gait Ambulation/Gait assistance: Min guard Ambulation Distance (Feet): 60 Feet Assistive device: Rolling walker (2 wheeled) Gait Pattern/deviations: Step-through pattern;Decreased stride length Gait velocity: decreased Gait velocity interpretation: Below normal speed for age/gender General Gait Details: 3 standing rest breaks due to symptomatic hypotension, slow and garded with gait.   Stairs            Wheelchair Mobility    Modified Rankin (Stroke Patients Only)       Balance Overall balance assessment: Needs assistance   Sitting balance-Leahy Scale: Good     Standing balance support: Bilateral upper extremity supported Standing balance-Leahy Scale: Fair Standing balance comment: stood with UE assist for 6 minutes during orthostatic assessment                    Cognition Arousal/Alertness: Awake/alert Behavior During Therapy: WFL for tasks assessed/performed Overall Cognitive Status: Within Functional Limits for tasks assessed                      Exercises      General Comments General comments (skin integrity, edema, etc.): performed manual faciliatation techniques for SI adjustment bilaterally in sidelying position. Modest relief report. Patient with increased catching pain intermittently throughout session with radicular symptoms. Heat provided upon return to bed      Pertinent Vitals/Pain Pain Assessment: 0-10 Pain Score: 7  Faces Pain Scale: Hurts little more Pain Location: left leg and low back Pain Descriptors / Indicators: Aching;Grimacing Pain Intervention(s): Heat applied;Utilized relaxation techniques;Repositioned;Monitored  during session    Home Living Family/patient expects to be discharged to:: Private residence                    Prior Function             PT Goals (current goals can now be found in the care plan section) Acute Rehab PT Goals Patient Stated Goal: feel better PT Goal Formulation: With patient Time For Goal Achievement: 04/01/16 Potential to Achieve Goals: Good Progress towards PT goals: Not progressing toward goals - comment (limited by symptomatic hypotension)    Frequency    Min 3X/week      PT Plan Discharge plan needs to be updated    Co-evaluation             End of Session Equipment Utilized During Treatment: Gait belt Activity Tolerance: Patient tolerated treatment well Patient left: in bed;with call bell/phone within reach     Time: 1024-1048 PT Time Calculation (min) (ACUTE ONLY): 24 min  Charges:  $Gait Training: 8-22 mins $Therapeutic Activity: 8-22 mins                    G Codes:      Fabio Asa 04/17/16, 11:03 AM Charlotte Crumb, PT DPT [phone]

## 2016-03-30 NOTE — Progress Notes (Signed)
Patient ID: [name], male   DOB: June 19, 1955, 61 y.o.   MRN: [id number]     Advanced Heart Failure Rounding Note  Primary Cardiologist: Dr Jens Som Primary HF: Dr. Shirlee Latch   Subjective:    Admitted from ED 03/17/16 with recurrent low output HF with NYHA IIIb-IV symptoms.   He was on milrinone initially with diuresis, now off.  RHC 1/26 looked good.   BCx 03/18/16 2/2 coag negative staph. ICD pocked noted to have serous drainage and exposed ICD. CT abdomen/pelvis did not show evidence for discitis or acute MSK changes Afebrile. WBCs WNL currently.   Repeat BCx 03/24/16 NGTD.   S/p ICD extraction 03/23/16 with placement of Temporary permanent pacemaker with previous pacer dependency due to CHB.   TEE on 1/26 with no evidence of vegetation.   Dual chamber ICD with His bundle pacing placed on 2/2.    Had dizziness over night. Denies SOB. Ongoing back pain.   RHC Procedural Findings (1/26, off milrinone): Hemodynamics (mmHg) RA mean 1 RV 36/8 PA 36/11, mean 21 PCWP mean 7 Oxygen saturations: PA 62% AO 96% Cardiac Output (Fick) 6.58  Cardiac Index (Fick) 3.06 Cardiac Output (Thermo) 6.62 Cardiac Index (Thermo) 3.08  Objective:   Weight Range: 198 lb 4.8 oz (89.9 kg) Body mass index is 27.66 kg/m.   Vital Signs:   Temp:  [97.9 F (36.6 C)-98.4 F (36.9 C)] 98 F (36.7 C) (02/05 0637) Pulse Rate:  [69-70] 70 (02/05 0637) Resp:  [16-18] 18 (02/05 0637) BP: (103-110)/(53-62) 110/62 (02/05 0637) SpO2:  [96 %-99 %] 98 % (02/05 0637) Weight:  [198 lb 4.8 oz (89.9 kg)] 198 lb 4.8 oz (89.9 kg) (02/05 0637) Last BM Date: 03/29/16  Weight change: Filed Weights   03/28/16 1501 03/29/16 0453 03/30/16 0637  Weight: 205 lb 14.6 oz (93.4 kg) 200 lb 14.4 oz (91.1 kg) 198 lb 4.8 oz (89.9 kg)    Intake/Output:   Intake/Output Summary (Last 24 hours) at 03/30/16 0842 Last data filed at 03/30/16 4098  Gross per 24 hour  Intake             1217 ml  Output             2250 ml    Net            -1033 ml     Physical Exam: General: Elderly appearing. NAD. In bed. [name]        HEENT: Normal Neck: supple. JVP 5-6 cm. Carotids 2+ bilat; no bruits. No thyromegaly or nodule noted.  Cor: PMI nondisplaced. Regular. No M/G/R appreciated. No M/G/R    R upper chest steri strips. L upper chest dressing.  Lungs: CTAB, normal effort Abdomen: soft, NT, ND, no HSM. No bruits or masses. +BS   Extremities: no cyanosis, clubbing, rash. No edema.     Neuro: alert & oriented x 3, cranial nerves grossly intact. moves all 4 extremities w/o difficulty. Affect pleasant.  Telemetry: Reviewed, A sensed V paced. Occasional NSVT   Labs: CBC  Recent Labs  03/29/16 0417 03/30/16 0446  WBC 7.5 6.8  NEUTROABS 4.1 4.1  HGB 10.1* 10.4*  HCT 30.5* 31.4*  MCV 93.3 92.6  PLT 296 325   Basic Metabolic Panel  Recent Labs  03/29/16 0417 03/30/16 0446  NA 136 135  K 4.4 4.1  CL 100* 101  CO2 28 28  GLUCOSE 91 96  BUN 19 18  CREATININE 1.04 1.13  CALCIUM 9.1 9.0   Liver Function  Tests No results for input(s): AST, ALT, ALKPHOS, BILITOT, PROT, ALBUMIN in the last 72 hours. No results for input(s): LIPASE, AMYLASE in the last 72 hours. Cardiac Enzymes No results for input(s): CKTOTAL, CKMB, CKMBINDEX, TROPONINI in the last 72 hours.  BNP: BNP (last 3 results)  Recent Labs  01/13/16 1008 01/27/16 1155 03/17/16 0311  BNP 256.0* 595.7* 894.5*    ProBNP (last 3 results) No results for input(s): PROBNP in the last 8760 hours.   D-Dimer No results for input(s): DDIMER in the last 72 hours. Hemoglobin A1C No results for input(s): HGBA1C in the last 72 hours. Fasting Lipid Panel No results for input(s): CHOL, HDL, LDLCALC, TRIG, CHOLHDL, LDLDIRECT in the last 72 hours. Thyroid Function Tests No results for input(s): TSH, T4TOTAL, T3FREE, THYROIDAB in the last 72 hours.  Invalid input(s): FREET3  Other results:  Imaging/Studies:  No results found.  Medications:     Scheduled Medications: . amiodarone  200 mg Oral Daily  . aspirin EC  81 mg Oral Daily  . atorvastatin  40 mg Oral q1800  . bisoprolol  5 mg Oral Daily  .  ceFAZolin (ANCEF) IV  2 g Intravenous Q8H  . clopidogrel  75 mg Oral Daily  . digoxin  0.125 mg Oral QODAY  . docusate sodium  100 mg Oral BID  . famotidine  20 mg Oral BID  . feeding supplement (ENSURE ENLIVE)  237 mL Oral BID BM  . furosemide  40 mg Oral Daily  . lidocaine  1 patch Transdermal Q24H  . losartan  25 mg Oral Daily  . magnesium oxide  400 mg Oral Daily  . mexiletine  200 mg Oral Q12H  . nicotine  21 mg Transdermal Daily  . PARoxetine  20 mg Oral Daily  . spironolactone  25 mg Oral Daily    Infusions:   PRN Medications: acetaminophen, albuterol, ALPRAZolam, cyclobenzaprine, hydrOXYzine, nitroGLYCERIN, ondansetron (ZOFRAN) IV, oxyCODONE-acetaminophen, sodium chloride flush, traMADol  Assessment/Plan   1. Acute on chronic systolic CHF: Echo 11/2015 LVEF 15%, ischemic cardiomyopathy.  Initially on milrinone and diuresed, now off milrinone and back on po Lasix.  See RHC above => filling pressures optimized and CO preserved off milrinone.  Initial presentation may have been related to Coag negative Staph. Have begun LVAD work up. TEE 1/26; LVEF 15% mod-severe MR. He developed orthostatic hypotension after dual chamber ICD removed and temp-perm pacer placed, suspect due to loss of AV synchrony.  Now s/p dual chamber ICD with His bundle pacing, he is a-v sequentially paced and BP is higher.   - Volume status stable. Continue Lasix 40 mg po daily.    - Continue digoxin and losartan. - Cut back bisoprolol to 2.5 mg daily and cut back spironolactone to 12.5 mg daily. Check orthostatics.  Will not increase with dizziness.  - On going work up for LVAD. May eventually be transplant candidate. Just stopped smoking PTA. VAD coordinator and Dr. Laneta Simmers have seen. 2. CAD: Recent LHC in 12/17 with stable coronary disease  -  Continue statin, ASA, Plavix.   3. Ascending aortic aneurysm: 4.1 cm 8/17. No evidence of dissection on CTA 03/17/16.  4. VT storm in 12/17: Continue amiodarone and Mexiletine.  - ICD has been re-implanted. 5. Low back pain, Chronic: Ortho has seen. Suspect MSK.  No discitis/Abscess on CT abdomen/pelvis. Pain continues to be an issue. - Work with PT.  - Continue flexeril and tramadol prn.  6. 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  - s/p ICD extraction due to infected pocked.  - Now has had device replaced.  7. ID: Blood cultures (done for back pain, ?discitis given concerns about ICD pocket) returned 2/2 coag negative Staph.  ICD eroded with drainage from site.  - s/p Extraction 03/23/16 and device replacement on 2/2 as above.  - Continue cefazolin IV per ID. Will be on until 04/06/16 (will need PICC, place day of discharge.) Blood cultures re-drawn 1/25. NGTD.  - TEE 1/26 no vegetation.  -[facility].   PT consult pending. May need SNF versus HH.  PICC ordered for today.   Possible d/c today versus tomorrow.   Tonye BecketAmy Clegg, NP  03/30/2016 8:42 AM   Patient seen with NP, agree with the above note.  He was lightheaded yesterday, better today.  Says back is better.  Will cut back a bit on meds as above.  Orthostatics today.  Work with PT.  Home versus SNF tomorrow.   Marca AnconaDalton Jahziah Simonin 03/30/2016 9:15 AM

## 2016-03-30 NOTE — Progress Notes (Signed)
Patient reported feeling dizzy and that it felt like his blood pressure dropped. Patient VS are WNL, BP a little low. Notified rapid response nurse when she did her rounds. Will continue to monitor.

## 2016-03-30 NOTE — Progress Notes (Signed)
Subjective: No new complaints from this am   Antibiotics:  Anti-infectives    Start     Dose/Rate Route Frequency Ordered Stop   03/27/16 2200  ceFAZolin (ANCEF) IVPB 2g/100 mL premix  Status:  Discontinued     2 g 200 mL/hr over 30 Minutes Intravenous Every 8 hours 03/27/16 0755 03/27/16 1737   03/27/16 2000  ceFAZolin (ANCEF) IVPB 1 g/50 mL premix  Status:  Discontinued     1 g 100 mL/hr over 30 Minutes Intravenous Every 6 hours 03/27/16 1728 03/27/16 1737   03/27/16 2000  ceFAZolin (ANCEF) IVPB 2g/100 mL premix     2 g 200 mL/hr over 30 Minutes Intravenous Every 8 hours 03/27/16 1737     03/27/16 0800  gentamicin (GARAMYCIN) 80 mg in sodium chloride irrigation 0.9 % 500 mL irrigation     80 mg Irrigation To Cath Lab 03/26/16 1910 03/27/16 1611   03/27/16 0800  ceFAZolin (ANCEF) IVPB 2g/100 mL premix     2 g 200 mL/hr over 30 Minutes Intravenous To Cath Lab 03/26/16 1910 03/27/16 1443   03/25/16 0600  ceFAZolin (ANCEF) IVPB 2g/100 mL premix  Status:  Discontinued     2 g 200 mL/hr over 30 Minutes Intravenous Every 8 hours 03/24/16 1330 03/27/16 0755   03/23/16 1702  gentamicin (GARAMYCIN) 80 mg in sodium chloride irrigation 0.9 % 500 mL irrigation  Status:  Discontinued       As needed 03/23/16 1703 03/23/16 2154   03/23/16 1015  gentamicin (GARAMYCIN) 80 mg in sodium chloride irrigation 0.9 % 500 mL irrigation  Status:  Discontinued     80 mg Irrigation To Cath Lab 03/23/16 0958 03/23/16 2315   03/23/16 1015  ceFAZolin (ANCEF) IVPB 2g/100 mL premix  Status:  Discontinued     2 g 200 mL/hr over 30 Minutes Intravenous To Cath Lab 03/23/16 0958 03/23/16 2315   03/19/16 1000  vancomycin (VANCOCIN) IVPB 1000 mg/200 mL premix  Status:  Discontinued     1,000 mg 200 mL/hr over 60 Minutes Intravenous Every 8 hours 03/19/16 0915 03/19/16 0954   03/19/16 1000  cefTRIAXone (ROCEPHIN) 2 g in dextrose 5 % 50 mL IVPB  Status:  Discontinued     2 g 100 mL/hr over 30 Minutes  Intravenous Every 24 hours 03/19/16 0954 03/24/16 1330      Medications: Scheduled Meds: . amiodarone  200 mg Oral Daily  . aspirin EC  81 mg Oral Daily  . atorvastatin  40 mg Oral q1800  . [START ON 03/31/2016] bisoprolol  2.5 mg Oral Daily  .  ceFAZolin (ANCEF) IV  2 g Intravenous Q8H  . clopidogrel  75 mg Oral Daily  . digoxin  0.125 mg Oral QODAY  . docusate sodium  100 mg Oral BID  . famotidine  20 mg Oral BID  . feeding supplement (ENSURE ENLIVE)  237 mL Oral BID BM  . lidocaine  1 patch Transdermal Q24H  . losartan  25 mg Oral Daily  . magnesium oxide  400 mg Oral Daily  . mexiletine  200 mg Oral Q12H  . nicotine  21 mg Transdermal Daily  . PARoxetine  20 mg Oral Daily   Continuous Infusions:  PRN Meds:.acetaminophen, albuterol, ALPRAZolam, cyclobenzaprine, hydrOXYzine, nitroGLYCERIN, ondansetron (ZOFRAN) IV, oxyCODONE-acetaminophen, sodium chloride flush, traMADol    Objective: Weight change: -7 lb 9.8 oz (-3.452 kg)  Intake/Output Summary (Last 24 hours) at 03/30/16 1250 Last data filed at 03/30/16 0900  Gross per 24 hour  Intake             1437 ml  Output             1750 ml  Net             -313 ml   Blood pressure (!) 93/53, pulse 70, temperature 98 F (36.7 C), temperature source Oral, resp. rate 18, height 5\' 11"  (1.803 m), weight 198 lb 4.8 oz (89.9 kg), SpO2 98 %. Temp:  [97.9 F (36.6 C)-98.4 F (36.9 C)] 98 F (36.7 C) (02/05 1222) Pulse Rate:  [70] 70 (02/05 1222) Resp:  [16-18] 18 (02/05 1222) BP: (65-110)/(53-62) 93/53 (02/05 1222) SpO2:  [96 %-98 %] 98 % (02/05 1222) Weight:  [198 lb 4.8 oz (89.9 kg)] 198 lb 4.8 oz (89.9 kg) (02/05 4098)  Physical Exam: General: Alert and awake, oriented x3, not in any acute distress. HEENT: anicteric sclera, pupils reactive to light and accommodation, EOMI CVS regular rate, normal r,  no murmur rubs or gallops Chest: clear to auscultation bilaterally, no wheezing, rales or rhonchi Abdomen: soft nontender,  nondistended, normal bowel sounds,  Skin: tattoos, PM site bandaged on the left, new PM generator left upper chest Neuro: nonfocal  CBC:    BMET  Recent Labs  03/29/16 0417 03/30/16 0446  NA 136 135  K 4.4 4.1  CL 100* 101  CO2 28 28  GLUCOSE 91 96  BUN 19 18  CREATININE 1.04 1.13  CALCIUM 9.1 9.0     Liver Panel  No results for input(s): PROT, ALBUMIN, AST, ALT, ALKPHOS, BILITOT, BILIDIR, IBILI in the last 72 hours.     Sedimentation Rate No results for input(s): ESRSEDRATE in the last 72 hours. C-Reactive Protein No results for input(s): CRP in the last 72 hours.  Micro Results: Recent Results (from the past 720 hour(s))  MRSA PCR Screening     Status: None   Collection Time: 03/17/16  5:15 PM  Result Value Ref Range Status   MRSA by PCR NEGATIVE NEGATIVE Final    Comment:        The GeneXpert MRSA Assay (FDA approved for NASAL specimens only), is one component of a comprehensive MRSA colonization surveillance program. It is not intended to diagnose MRSA infection nor to guide or monitor treatment for MRSA infections.   Culture, blood (Routine X 2) w Reflex to ID Panel     Status: Abnormal   Collection Time: 03/18/16 10:00 AM  Result Value Ref Range Status   Specimen Description BLOOD LEFT ANTECUBITAL  Final   Special Requests IN PEDIATRIC BOTTLE  2CC  Final   Culture  Setup Time   Final    GRAM POSITIVE COCCI IN CLUSTERS IN PEDIATRIC BOTTLE CRITICAL VALUE NOTED.  VALUE IS CONSISTENT WITH PREVIOUSLY REPORTED AND CALLED VALUE.    Culture (A)  Final    STAPHYLOCOCCUS EPIDERMIDIS SUSCEPTIBILITIES PERFORMED ON PREVIOUS CULTURE WITHIN THE LAST 5 DAYS.    Report Status 03/21/2016 FINAL  Final  Culture, blood (Routine X 2) w Reflex to ID Panel     Status: Abnormal   Collection Time: 03/18/16 10:06 AM  Result Value Ref Range Status   Specimen Description BLOOD LEFT HAND  Final   Special Requests IN PEDIATRIC BOTTLE  3CC  Final   Culture  Setup Time    Final    GRAM POSITIVE COCCI IN CLUSTERS IN PEDIATRIC BOTTLE CRITICAL RESULT CALLED TO, READ BACK BY AND VERIFIED WITH: PHARMD A  JOHNSTON [id number] [phone] MLM    Culture STAPHYLOCOCCUS EPIDERMIDIS (A)  Final   Report Status 03/21/2016 FINAL  Final   Organism ID, Bacteria STAPHYLOCOCCUS EPIDERMIDIS  Final      Susceptibility   Staphylococcus epidermidis - MIC*    CIPROFLOXACIN <=0.5 SENSITIVE Sensitive     ERYTHROMYCIN <=0.25 SENSITIVE Sensitive     GENTAMICIN <=0.5 SENSITIVE Sensitive     OXACILLIN <=0.25 SENSITIVE Sensitive     TETRACYCLINE <=1 SENSITIVE Sensitive     VANCOMYCIN 1 SENSITIVE Sensitive     TRIMETH/SULFA <=10 SENSITIVE Sensitive     CLINDAMYCIN <=0.25 SENSITIVE Sensitive     RIFAMPIN <=0.5 SENSITIVE Sensitive     Inducible Clindamycin NEGATIVE Sensitive     * STAPHYLOCOCCUS EPIDERMIDIS  Blood Culture ID Panel (Reflexed)     Status: Abnormal   Collection Time: 03/18/16 10:06 AM  Result Value Ref Range Status   Enterococcus species NOT DETECTED NOT DETECTED Final   Listeria monocytogenes NOT DETECTED NOT DETECTED Final   Staphylococcus species DETECTED (A) NOT DETECTED Final    Comment: CRITICAL RESULT CALLED TO, READ BACK BY AND VERIFIED WITH: PHARMD A JOHNSTON [id number] MLM    Staphylococcus aureus NOT DETECTED NOT DETECTED Final   Methicillin resistance NOT DETECTED NOT DETECTED Final   Streptococcus species NOT DETECTED NOT DETECTED Final   Streptococcus agalactiae NOT DETECTED NOT DETECTED Final   Streptococcus pneumoniae NOT DETECTED NOT DETECTED Final   Streptococcus pyogenes NOT DETECTED NOT DETECTED Final   Acinetobacter baumannii NOT DETECTED NOT DETECTED Final   Enterobacteriaceae species NOT DETECTED NOT DETECTED Final   Enterobacter cloacae complex NOT DETECTED NOT DETECTED Final   Escherichia coli NOT DETECTED NOT DETECTED Final   Klebsiella oxytoca NOT DETECTED NOT DETECTED Final   Klebsiella pneumoniae NOT DETECTED NOT DETECTED Final   Proteus  species NOT DETECTED NOT DETECTED Final   Serratia marcescens NOT DETECTED NOT DETECTED Final   Haemophilus influenzae NOT DETECTED NOT DETECTED Final   Neisseria meningitidis NOT DETECTED NOT DETECTED Final   Pseudomonas aeruginosa NOT DETECTED NOT DETECTED Final   Candida albicans NOT DETECTED NOT DETECTED Final   Candida glabrata NOT DETECTED NOT DETECTED Final   Candida krusei NOT DETECTED NOT DETECTED Final   Candida parapsilosis NOT DETECTED NOT DETECTED Final   Candida tropicalis NOT DETECTED NOT DETECTED Final  Culture, blood (routine x 2)     Status: None   Collection Time: 03/19/16 11:34 PM  Result Value Ref Range Status   Specimen Description BLOOD LEFT ARM  Final   Special Requests BOTTLES DRAWN AEROBIC AND ANAEROBIC  Final   Culture NO GROWTH 5 DAYS  Final   Report Status 03/25/2016 FINAL  Final  Culture, blood (routine x 2)     Status: None   Collection Time: 03/19/16 11:43 PM  Result Value Ref Range Status   Specimen Description BLOOD LEFT HAND  Final   Special Requests AEROBIC BOTTLE ONLY  Final   Culture NO GROWTH 5 DAYS  Final   Report Status 03/25/2016 FINAL  Final  MRSA PCR Screening     Status: None   Collection Time: 03/23/16 11:27 PM  Result Value Ref Range Status   MRSA by PCR NEGATIVE NEGATIVE Final    Comment:        The GeneXpert MRSA Assay (FDA approved for NASAL specimens only), is one component of a comprehensive MRSA colonization surveillance program. It is not intended to diagnose MRSA infection nor to guide or monitor  treatment for MRSA infections.   Culture, blood (routine x 2)     Status: None   Collection Time: 03/24/16  2:51 AM  Result Value Ref Range Status   Specimen Description BLOOD LEFT WRIST  Final   Special Requests IN PEDIATRIC BOTTLE 1CC  Final   Culture NO GROWTH 5 DAYS  Final   Report Status 03/29/2016 FINAL  Final  Culture, blood (routine x 2)     Status: None   Collection Time: 03/24/16  5:53 AM  Result Value  Ref Range Status   Specimen Description BLOOD LEFT WRIST  Final   Special Requests BOTTLES DRAWN AEROBIC AND ANAEROBIC 5CC EACH  Final   Culture NO GROWTH 5 DAYS  Final   Report Status 03/29/2016 FINAL  Final  Culture, Urine     Status: None   Collection Time: 03/24/16  7:23 PM  Result Value Ref Range Status   Specimen Description URINE, CLEAN CATCH  Final   Special Requests NONE  Final   Culture NO GROWTH  Final   Report Status 03/26/2016 FINAL  Final  Surgical pcr screen     Status: None   Collection Time: 03/27/16  4:16 AM  Result Value Ref Range Status   MRSA, PCR NEGATIVE NEGATIVE Final   Staphylococcus aureus NEGATIVE NEGATIVE Final    Comment:        The Xpert SA Assay (FDA approved for NASAL specimens in patients over 51 years of age), is one component of a comprehensive surveillance program.  Test performance has been validated by [facility] for patients greater than or equal to 74 year old. It is not intended to diagnose infection nor to guide or monitor treatment.     Studies/Results: No results found.    Assessment/Plan:  INTERVAL HISTORY:  Post ICD cultures NG Final  ICD remiplanted     Active Problems:   Acute on chronic systolic congestive heart failure (HCC)   Bacteremia   Palliative care encounter   Orthostatic dizziness    AMIIR HECKARD is a 61 y.o. male with  ICD pocket infection and Bacteremia with Staph Epi (not staph aureus). He Not have vegetations on his leads and his bacteremia appears to have been clearing rapidly.  -- because this is a non-staph aureus species that has cleared from blood quickly we can treat him with just 2 weeks of effective anti staphylococcal antibiotics  --IF his post ICD removal cultures are NG at 72 hours we device can be reimplanted  --timing of IV antiboitics would be day #1  = 03/24/16  Plan provided cultures remain negative would be as follows  Diagnosis: ICD infection with bacteremia  Culture  Result: Coag negative staph  No Known Allergies  Discharge antibiotics: cefazolin 2 grams IV q 8 hours   Duration: 2 weeks postop  End Date:  03/06/16  [facility] Care Per Protocol:  Labs weekly while on IV antibiotics: x__ CBC with differential _x_ CMP   _x_ Please pull PIC at completion of IV antibiotics __ Please leave PIC in place until doctor has seen patient or been notified  Fax weekly labs to [phone]  Clinic Follow Up Appt:  He has appt with me on March 1st at present. It would be good to check surveilance blood cultures x 2 sites on February 26th or later. This could be done in our clinic if needed without MD appt.  I will sign off for now please call with further questions.    LOS:  13 days   Acey Lav 03/30/2016, 12:50 PM

## 2016-03-30 NOTE — Progress Notes (Signed)
   Called by nursing staff.   Orthostatic BP. Symptomatic with dizziness reported.  Lying 106/53 Sitting  69/42 Standing 78/55   New orders provided. Hold spiro and lasix for now.  Karolyn Messing NP-C  11:16 AM

## 2016-03-30 NOTE — Progress Notes (Signed)
Patient Name: [name] Date of Encounter: 03/30/2016      SUBJECTIVE Back better Less sob Dizzy yday     CURRENT MEDS . amiodarone  200 mg Oral Daily  . aspirin EC  81 mg Oral Daily  . atorvastatin  40 mg Oral q1800  . bisoprolol  5 mg Oral Daily  .  ceFAZolin (ANCEF) IV  2 g Intravenous Q8H  . clopidogrel  75 mg Oral Daily  . digoxin  0.125 mg Oral QODAY  . docusate sodium  100 mg Oral BID  . famotidine  20 mg Oral BID  . feeding supplement (ENSURE ENLIVE)  237 mL Oral BID BM  . furosemide  40 mg Oral Daily  . lidocaine  1 patch Transdermal Q24H  . losartan  25 mg Oral Daily  . magnesium oxide  400 mg Oral Daily  . mexiletine  200 mg Oral Q12H  . nicotine  21 mg Transdermal Daily  . PARoxetine  20 mg Oral Daily  . spironolactone  25 mg Oral Daily    OBJECTIVE  Vitals:   03/29/16 1159 03/29/16 2009 03/30/16 0000 03/30/16 0637  BP:  103/62 (!) 104/53 110/62  Pulse: 69 70 70 70  Resp: 18 16 18 18   Temp: 98 F (36.7 C) 98.4 F (36.9 C) 97.9 F (36.6 C) 98 F (36.7 C)  TempSrc: Oral Oral Oral Oral  SpO2: 99% 96% 97% 98%  Weight:    198 lb 4.8 oz (89.9 kg)  Height:        Intake/Output Summary (Last 24 hours) at 03/30/16 0853 Last data filed at 03/30/16 4098  Gross per 24 hour  Intake             1217 ml  Output             2250 ml  Net            -1033 ml   Filed Weights   03/28/16 1501 03/29/16 0453 03/30/16 0637  Weight: 205 lb 14.6 oz (93.4 kg) 200 lb 14.4 oz (91.1 kg) 198 lb 4.8 oz (89.9 kg)    PHYSICAL EXAM  General: Pleasant, NAD. Neuro: Alert and oriented X 3. Moves all extremities spontaneously. Psych: Normal affect. HEENT:  Normal  Neck: Supple without bruits or JVD.  Lungs:  Resp regular and unlabored, CTA. Heart: RRR no s3, s4, or murmurs. ICD pocket - with a small amount of serosanguinous drainage Abdomen: Soft, non-tender, non-distended, BS + x 4.  Extremities: No clubbing, cyanosis or edema. DP/PT/Radials 2+ and equal  bilaterally.  Accessory Clinical Findings  CBC  Recent Labs  03/29/16 0417 03/30/16 0446  WBC 7.5 6.8  NEUTROABS 4.1 4.1  HGB 10.1* 10.4*  HCT 30.5* 31.4*  MCV 93.3 92.6  PLT 296 325   Basic Metabolic Panel  Recent Labs  03/29/16 0417 03/30/16 0446  NA 136 135  K 4.4 4.1  CL 100* 101  CO2 28 28  GLUCOSE 91 96  BUN 19 18  CREATININE 1.04 1.13  CALCIUM 9.1 9.0   Liver Function Tests No results for input(s): AST, ALT, ALKPHOS, BILITOT, PROT, ALBUMIN in the last 72 hours. No results for input(s): LIPASE, AMYLASE in the last 72 hours. Cardiac Enzymes No results for input(s): CKTOTAL, CKMB, CKMBINDEX, TROPONINI in the last 72 hours. BNP Invalid input(s): POCBNP D-Dimer No results for input(s): DDIMER in the last 72 hours. Hemoglobin A1C No results for input(s): HGBA1C in the last 72 hours. Fasting  Lipid Panel No results for input(s): CHOL, HDL, LDLCALC, TRIG, CHOLHDL, LDLDIRECT in the last 72 hours. Thyroid Function Tests No results for input(s): TSH, T4TOTAL, T3FREE, THYROIDAB in the last 72 hours.  Invalid input(s): FREET3  Telemetry Personally reviewed no VT  Radiology/Studies ECG personally reviewed  Very fractionated QRS   Dg Chest 2 View  Result Date: 03/28/2016 CLINICAL DATA:  Post pacemaker revision. EXAM: CHEST  2 VIEW COMPARISON:  03/24/2016 and 03/17/2016 radiographs. FINDINGS: Interval revision of right subclavian pacemaker with placement of an AICD. There are 3 leads, extending into the right atrium and right ventricle. The heart size and mediastinal contours are stable. The lungs are clear. There is no pleural effusion or pneumothorax. IMPRESSION: Interval pacemaker/AICD revision without demonstrated complication. Electronically Signed   By: Carey BullocksWilliam  Veazey M.D.   On: 03/28/2016 07:10   Dg Chest 2 View  Result Date: 03/24/2016 CLINICAL DATA:  Pacer removal EXAM: CHEST  2 VIEW COMPARISON:  7 days ago FINDINGS: Single chamber pacer from the right with  lead in the expected location of the right ventricle. Left-sided pacer has been removed. Cardiopericardial size is stable. Cardiomegaly and coronary stent. Low volume chest without pneumothorax. Chest wall emphysema on the left, presumably related to dissection of generator pack. IMPRESSION: 1. Stable cardiopericardial size after left-sided pacer removal. Left chest wall gas, no pneumothorax. 2. New right-sided pacer with right ventricular lead. 3. Low volume chest. Electronically Signed   By: Marnee SpringJonathon  Watts M.D.   On: 03/24/2016 08:39   Dg Chest 2 View  Result Date: 03/17/2016 CLINICAL DATA:  Chest and lower back pain. History of 3 myocardial infarctions with stents. EXAM: CHEST  2 VIEW COMPARISON:  01/29/2016 CXR FINDINGS: Left-sided AICD device with coronary sinus, right atrial and right ventricular leads present. Cardiomegaly is identified with coronary arterial stenting. No aortic aneurysm. Chronic mild interstitial prominence without pneumonic consolidation, effusion or pneumothorax. Mild apical pleuroparenchymal thickening and scarring. No acute osseous abnormality. IMPRESSION: Cardiomegaly without acute pulmonary disease. Electronically Signed   By: Tollie Ethavid  Kwon M.D.   On: 03/17/2016 03:53   Dg Lumbar Spine 2-3 Views  Result Date: 03/18/2016 CLINICAL DATA:  Acute low back pain without known injury. EXAM: LUMBAR SPINE - 2-3 VIEW COMPARISON:  CT scan of December 16, 2015. FINDINGS: No fracture or spondylolisthesis is noted. Atherosclerosis of abdominal aorta is noted. Mild degenerative disc disease is noted at L3-4. Remaining disc spaces appear intact. IMPRESSION: Mild degenerative disc disease at L3-4. No acute abnormality seen in the lumbar spine. Aortic atherosclerosis. Electronically Signed   By: Lupita RaiderJames  Green Jr, M.D.   On: 03/18/2016 11:36   Dg Chest Port 1 View  Result Date: 03/17/2016 CLINICAL DATA:  PICC line placement EXAM: PORTABLE CHEST 1 VIEW COMPARISON:  03/17/2016 FINDINGS: The  visualized bony structures of the thorax are intact. 2054 hours. Right PICC line tip overlies the distal SVC level. The cardio pericardial silhouette is enlarged. The lungs are clear wiithout focal pneumonia, edema, pneumothorax or pleural effusion. Left pacer/AICD again noted. IMPRESSION: 1. Right PICC line tip overlies the distal SVC. 2. Cardiomegaly. Electronically Signed   By: Kennith CenterEric  Mansell M.D.   On: 03/17/2016 21:05   Ct Angio Chest/abd/pel For Dissection W And/or Wo Contrast  Result Date: 03/17/2016 CLINICAL DATA:  Right-sided chest pain EXAM: CT ANGIOGRAPHY CHEST, ABDOMEN AND PELVIS TECHNIQUE: Multidetector CT imaging through the chest, abdomen and pelvis was performed using the standard protocol during bolus administration of intravenous contrast. Multiplanar reconstructed images and MIPs were  obtained and reviewed to evaluate the vascular anatomy. CONTRAST:  100 mL Isovue 370. COMPARISON:  Chest x-ray from earlier in the same day, 12/16/2015, 10/18/2015 FINDINGS: CTA CHEST FINDINGS Cardiovascular: The thoracic aorta shows mild atherosclerotic calcifications without aneurysmal dilatation or dissection. Pulmonary artery shows a normal branching pattern without evidence of pulmonary embolism. Coronary calcifications and prior coronary stenting is noted. A defibrillator is again noted. Cardiac structures are mildly enlarged but stable. Multiple chest wall venous collaterals are identified likely related to stenosis of the left subclavian vein from the defibrillator. Mediastinum/Nodes: Thoracic inlet is within normal limits. Stable mediastinal adenopathy is noted in the right peritracheal and subcarinal region with the largest of these in the right peritracheal region measuring 16 mm in short axis which is stable from the prior exam. Scattered small hilar lymph nodes are noted also stable from the previous study. The esophagus as visualize is within normal limits. Lungs/Pleura: Mild emphysematous changes  are noted. No focal infiltrate or sizable effusion is seen. Mild peribronchial thickening is noted which is stable from the prior exam likely of a chronic nature. No acute abnormality is seen. Musculoskeletal: Degenerative changes of the thoracic spine are noted. No acute bony abnormality is seen. Review of the MIP images confirms the above findings. CTA ABDOMEN AND PELVIS FINDINGS VASCULAR Aorta: The abdominal aorta demonstrates atherosclerotic calcifications without aneurysmal dilatation or evidence of dissection. Celiac: Mild atherosclerotic calcifications are noted at the origin although the celiac axis is widely patent. SMA: Mild atherosclerotic changes are noted at the origin although the superior mesenteric artery is patent. Renals: Single renal arteries are identified bilaterally with mild atherosclerotic calcifications. No focal hemodynamically significant stenosis is noted. IMA: Widely patent Iliacs: Mild atherosclerotic changes are seen without aneurysmal dilatation or dissection. Veins: Although not timed for venous evaluation no definitive abnormality is seen. Review of the MIP images confirms the above findings. NON-VASCULAR Hepatobiliary: Multiple hypodensities are again identified within the liver consistent with hepatic cysts. These are stable from previous exams. The gallbladder has been surgically removed. Pancreas: Unremarkable. No pancreatic ductal dilatation or surrounding inflammatory changes. Spleen: Normal in size without focal abnormality. Adrenals/Urinary Tract: The adrenal glands are within normal limits. Kidneys are well visualized bilaterally without evidence of renal mass or calcification. No obstructive changes are seen. The bladder is well distended. Stomach/Bowel: Scattered diverticular changes are noted throughout the colon. The appendix is well visualized and within normal limits. No obstructive changes are seen. No inflammatory changes are identified. Lymphatic: No significant  lymphadenopathy is identified. Reproductive: Prostate is unremarkable. Other: No abdominal wall hernia or abnormality. No abdominopelvic ascites. Musculoskeletal: No acute or significant osseous findings. Review of the MIP images confirms the above findings. IMPRESSION: CTA of the chest: No evidence of aortic dissection or aneurysmal dilatation. No definitive pulmonary emboli are identified. Stable mediastinal and hilar lymph nodes likely reactive in nature. Mild changes of bronchitis are noted as well. 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 CTA of the abdomen and pelvis. No aneurysmal dilatation or dissection is noted. Scattered chronic changes similar to that noted on previous exams. Electronically Signed   By: Alcide Clever M.D.   On: 03/17/2016 08:33    ASSESSMENT AND PLAN  1. ICD system infection - s/p extraction. And reimplantation with His Bundle pacing   With BACTEREMIA---- NOW day d 12 of ABX    D 7 post op-- per ID needs 1 more week  2. CHB -   3. Chronic  systolic heart failure -    Hemodynamics improved with AV synchrony  4.  Ventricular tachycardia storm, ? LV proarrhythmia 5  Orthostatic intolerance improved with AV synchrony but  6.  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

## 2016-03-30 NOTE — Progress Notes (Signed)
CARDIAC REHAB PHASE I   PRE:  Rate/Rhythm: 70 paced  BP:  Sitting: 101/57, Standing: 102/63        SaO2: 99 RA  MODE:  Ambulation: 50 ft   POST:  Rate/Rhythm: 70 paced  BP:  Sitting: 104/47         SaO2: 99 RA  Pt required min assist from lying to sitting position in bed (of not pt did exit on L side today, which seemed easier for him), did rely on use of arms to scoot to edge of bed, despite instruction not to, stood with min assist. Pt c/o dizziness upon standing, BP 102/63. Pt ambulated 50 ft on RA, rolling walker, gait belt, assist x2 for safety, slow, mostly steady gait, tolerated fairly well. Pt c/o his "arms feeling tired," some leg/back pain, however states it is improved, c/o dizziness, standing rest x1. Pt to recliner after walk, OT at bedside, call bell within reach. Will follow.   [phone] Joylene GrapesEmily C Alle Difabio, RN, BSN 03/30/2016 9:38 AM

## 2016-03-30 NOTE — Progress Notes (Signed)
[facility]  [facility] will work with caregiver/daughters to teach IV ABX set up and administration to support independence in the home. [facility] is prepared to take pt home when ordered.   If patient discharges after hours, please call [phone].   Sedalia Mutaamela S Chandler 03/30/2016, 3:39 PM

## 2016-03-30 NOTE — Progress Notes (Signed)
Attempted PICC insertion via left brachial vein.  I was unable to thread the PICC beyond the subclavian area.   The pt stated,   "I can feel it and it hurts there" which was where I was meeting resistance.  Due to previous AICD being removed from left side recently the procedure was stopped.   There is a new pacer in the right side so that side restricted for now.  Primary RN made aware.   She let the NP for the heart failure service be aware of the situation.

## 2016-03-31 ENCOUNTER — Encounter (HOSPITAL_COMMUNITY): Payer: Self-pay | Admitting: Interventional Radiology

## 2016-03-31 ENCOUNTER — Inpatient Hospital Stay (HOSPITAL_COMMUNITY): Payer: Medicaid Other

## 2016-03-31 DIAGNOSIS — T827XXA Infection and inflammatory reaction due to other cardiac and vascular devices, implants and grafts, initial encounter: Secondary | ICD-10-CM | POA: Diagnosis not present

## 2016-03-31 HISTORY — PX: IR GENERIC HISTORICAL: IMG1180011

## 2016-03-31 LAB — BASIC METABOLIC PANEL
ANION GAP: 13 (ref 5–15)
BUN: 19 mg/dL (ref 6–20)
CALCIUM: 9.2 mg/dL (ref 8.9–10.3)
CO2: 26 mmol/L (ref 22–32)
Chloride: 99 mmol/L — ABNORMAL LOW (ref 101–111)
Creatinine, Ser: 1.1 mg/dL (ref 0.61–1.24)
GFR calc Af Amer: >60 mL/min (ref >60)
GFR calc non Af Amer: >60 mL/min (ref >60)
GLUCOSE: 90 mg/dL (ref 65–99)
Potassium: 4.2 mmol/L (ref 3.5–5.1)
Sodium: 138 mmol/L (ref 135–145)

## 2016-03-31 MED ORDER — LIDOCAINE HCL 1 % IJ SOLN
INTRAMUSCULAR | Status: DC | PRN
  Administered 2016-03-31: 1 mL

## 2016-03-31 MED ORDER — FUROSEMIDE 40 MG PO TABS: 20.0000 mg | ORAL_TABLET | Freq: Every day | ORAL | 11 refills | Status: DC

## 2016-03-31 MED ORDER — LOSARTAN POTASSIUM 25 MG PO TABS: 25.0000 mg | ORAL_TABLET | Freq: Every day | ORAL | Status: DC

## 2016-03-31 MED ORDER — HEPARIN SOD (PORK) LOCK FLUSH 100 UNIT/ML IV SOLN
250.0000 [IU] | INTRAVENOUS | Status: AC | PRN
  Administered 2016-03-31: 250 [IU]

## 2016-03-31 MED ORDER — IOPAMIDOL (ISOVUE-300) INJECTION 61%
INTRAVENOUS | Status: AC
  Administered 2016-03-31: 10 mL
  Filled 2016-03-31: qty 50

## 2016-03-31 MED ORDER — LOSARTAN POTASSIUM 25 MG PO TABS: 25.0000 mg | ORAL_TABLET | Freq: Every day | ORAL | 6 refills | Status: DC

## 2016-03-31 MED ORDER — LIDOCAINE HCL (PF) 1 % IJ SOLN
INTRAMUSCULAR | Status: AC
  Filled 2016-03-31: qty 30

## 2016-03-31 MED ORDER — CEFAZOLIN SODIUM-DEXTROSE 2-4 GM/100ML-% IV SOLN: 2.0000 g | Freq: Three times a day (TID) | INTRAVENOUS | 0 refills | Status: AC

## 2016-03-31 MED ORDER — SPIRONOLACTONE 25 MG PO TABS: 12.5000 mg | ORAL_TABLET | Freq: Every day | ORAL | 6 refills | Status: DC

## 2016-03-31 NOTE — Discharge Summary (Signed)
Advanced Heart Failure Team  Discharge Summary   Patient ID: [name] MRN: [id number], DOB/AGE: 63/10/1955 63 y.o. Admit date: 03/17/2016 D/C date:     03/31/2016   Primary Discharge Diagnoses:  1. A/C Systolic Heart Failure, ICM, EF 15% TEE 03/20/2016 2. CAD 3. Ascending aortic aneurysm: 4.1 cm 8/17. No evidence of dissection on CTA 03/17/16.  4. VT storm in 12/17: Continue amiodarone and Mexiletine.  5. Low back pain, Chronic 6. 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  - s/p ICD extraction due to infected pocked.  - Now has had device replaced.  7. ID: Blood cultures (done for back pain, ?discitis given concerns about ICD pocket) returned 2/2 coag negative Staph.    Hospital Course:   Admitted with increased dyspnea. Weight up ~8 lbs at home. Pertinent labs on admission include K 4.4 , Creatinine 1.53, Troponin negative. BNP 894.5. CTA chest/ABD/pelvis with no evidence of Aortic dissection or aneurysmal dilatation. No definite PE identified.  Mild bronchitis changes noted. Hospital course prolonged due to ICD infection with extraction followed by reimplant, volume overload, and A/C systolic heart failure. He is not being considered for LVAD and has started preliminary work up.   1. Acute on chronic systolic CHF: Echo 11/2015 LVEF 15%, ischemic cardiomyopathy.  Initially on milrinone and diuresed, now off milrinone and back on po Lasix.  See RHC above => filling pressures optimized and CO preserved off milrinone.  Initial presentation may have been related to Coag negative Staph. Have begun LVAD work up. TEE 1/26; LVEF 15% mod-severe MR. He developed orthostatic hypotension after dual chamber ICD removed and temp-perm pacer placed, suspect due to loss of AV synchrony.  Now s/p dual chamber ICD with His bundle pacing, he is a-v sequentially paced and BP increased.   While hospitalized he was volume overloaded and diuresed with IV lasix. Overall diuresed 13  pounds. He was transitioned to oral lasix. Unfortunately, he and episode of orthostasis so Lasix and spironolactone held. - Low dose lasix was restarted. Continue Lasix 20 mg daily and spironolactone 12.5 mg daily tomorrow.    - Continue digoxin and losartan, change losartan to pm. - Continue bisoprolol 2.5 daily.  - On going work up for LVAD. May eventually be transplant candidate. Just stopped smoking PTA. VAD coordinator and Dr. Laneta SimmersBartle have seen. 2. CAD: Recent LHC in 12/17 with stable coronary disease  - Continue statin, ASA, Plavix.   3. Ascending aortic aneurysm: 4.1 cm 8/17. No evidence of dissection on CTA 03/17/16.  4. VT storm in 12/17: Continue amiodarone and Mexiletine.  - ICD was re-implanted on 03/27/2016. He will follow up with EP in the community. R upper chest steri strips will remain in place and L upper chest old ICD site will remain covered with dry gauze.  5. Low back pain, Chronic: Ortho has seen. Suspect MSK.  No discitis/Abscess on CT abdomen/pelvis. Pain continues to be an issue but seems to be doing better.  - PT followed and will follow with HHPT.  - Continue flexeril and tramadol prn.  6. 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  - s/p ICD extraction due to infected pocked.  - Now has had device replaced on 03/27/2016 .  7. ID: Blood cultures (done for back pain, ?discitis given concerns about ICD pocket) returned 2/2 coag negative Staph.  ICD eroded with drainage from site.  - s/p Extraction 03/23/16 and device replacement on  2/2 as above.   - Continue cefazolin IV per ID. Will be on IV antibiotics until 04/06/16. Single lumen PICC 03/31/2016. Blood cultures re-drawn 1/25. NGTD. TEE 03/20/16 no vegetation.  Referred to [facility] for  home antibiotics.   He will continue to be followed closely in the HF clinic and will check BMET at that time. He is being considered for advanced therapies.   Consults: EP, Cardiac Surgery, Ortho and ID  RHC  03/20/2016 RA mean 1 RV 36/8 PA 36/11, mean 21 PCWP mean 7 Oxygen saturations: PA 62% AO 96% Cardiac Output (Fick) 6.58  Cardiac Index (Fick) 3.06 Cardiac Output (Thermo) 6.62 Cardiac Index (Thermo) 3.08   Discharge Weight:  198 ponds  Discharge Vitals: Blood pressure (!) 103/59, pulse 70, temperature 97.4 F (36.3 C), temperature source Oral, resp. rate 18, height 5\' 11"  (1.803 m), weight 198 lb 4.8 oz (89.9 kg), SpO2 98 %.  Labs: Lab Results  Component Value Date   WBC 6.8 03/30/2016   HGB 10.4 (L) 03/30/2016   HCT 31.4 (L) 03/30/2016   MCV 92.6 03/30/2016   PLT 325 03/30/2016    Recent Labs Lab 03/26/16 0328  03/31/16 0429  NA 135  < > 138  K 4.2  < > 4.2  CL 99*  < > 99*  CO2 30  < > 26  BUN 18  < > 19  CREATININE 1.16  < > 1.10  CALCIUM 8.7*  < > 9.2  PROT 6.1*  --   --   BILITOT 0.6  --   --   ALKPHOS 41  --   --   ALT 19  --   --   AST 16  --   --   GLUCOSE 106*  < > 90  < > = values in this interval not displayed. Lab Results  Component Value Date   CHOL 122 12/07/2015   HDL 25 (L) 12/07/2015   LDLCALC 82 12/07/2015   TRIG 73 12/07/2015   BNP (last 3 results)  Recent Labs  01/13/16 1008 01/27/16 1155 03/17/16 0311  BNP 256.0* 595.7* 894.5*    ProBNP (last 3 results) No results for input(s): PROBNP in the last 8760 hours.   Diagnostic Studies/Procedures   No results found.  Discharge Medications   Allergies as of 03/31/2016   No Known Allergies     Medication List    STOP taking these medications   potassium chloride SA 20 MEQ tablet Commonly known as:  K-DUR,KLOR-CON     TAKE these medications   ALPRAZolam 0.25 MG tablet Commonly known as:  XANAX Take 1 tablet (0.25 mg total) by mouth at bedtime as needed for anxiety. F/u with PCP for refills.   amiodarone 200 MG tablet Commonly known as:  PACERONE Take 1 tablet (200 mg total) by mouth daily.   aspirin EC 81 MG tablet Take 81 mg by mouth daily.   atorvastatin 40 MG  tablet Commonly known as:  LIPITOR Take 1 tablet (40 mg total) by mouth daily.   bisoprolol 5 MG tablet Commonly known as:  ZEBETA Take 0.5 tablets (2.5 mg total) by mouth at bedtime.   ceFAZolin 2-4 GM/100ML-% IVPB Commonly known as:  ANCEF Inject 100 mLs (2 g total) into the vein every 8 (eight) hours.   clopidogrel 75 MG tablet Commonly known as:  PLAVIX Take 1 tablet (75 mg total) by mouth daily.   cyclobenzaprine 10 MG tablet Commonly known as:  FLEXERIL Take 1 tablet (10 mg total)  by mouth 2 (two) times daily as needed for muscle spasms.   digoxin 0.125 MG tablet Commonly known as:  LANOXIN Take one tablet (0.125 mg) by mouth once every other day   famotidine 20 MG tablet Commonly known as:  PEPCID Take 1 tablet (20 mg total) by mouth 2 (two) times daily.   furosemide 40 MG tablet Commonly known as:  LASIX Take 0.5 tablets (20 mg total) by mouth daily. Start taking on:  04/01/2016 What changed:  how much to take  when to take this  additional instructions   hydrOXYzine 25 MG capsule Commonly known as:  VISTARIL Take 25 mg by mouth at bedtime as needed (sleep).   losartan 25 MG tablet Commonly known as:  COZAAR Take 1 tablet (25 mg total) by mouth at bedtime. What changed:  when to take this   magnesium oxide 400 (241.3 Mg) MG tablet Commonly known as:  MAG-OX Take 1 tablet (400 mg total) by mouth daily.   mexiletine 200 MG capsule Commonly known as:  MEXITIL Take 1 capsule (200 mg total) by mouth every 12 (twelve) hours.   nicotine 21 mg/24hr patch Commonly known as:  NICODERM CQ - dosed in mg/24 hours Place 21 mg onto the skin daily.   nitroGLYCERIN 0.4 MG SL tablet Commonly known as:  NITROSTAT Place 1 tablet (0.4 mg total) under the tongue every 5 (five) minutes as needed. For chest pain   oxyCODONE-acetaminophen 5-325 MG tablet Commonly known as:  PERCOCET/ROXICET Take 1-2 tablets by mouth every 6 (six) hours as needed for severe pain.    PARoxetine 10 MG tablet Commonly known as:  PAXIL Take 1 tablet (10 mg total) by mouth daily.   PROAIR HFA 108 (90 Base) MCG/ACT inhaler Generic drug:  albuterol Inhale 2 puffs into the lungs every 4 (four) hours as needed for wheezing or shortness of breath.   spironolactone 25 MG tablet Commonly known as:  ALDACTONE Take 0.5 tablets (12.5 mg total) by mouth daily. What changed:  how much to take            Durable Medical Equipment        Start     Ordered   03/31/16 1030  Heart failure home health orders  (Heart failure home health orders / Face to face)  Once    Comments:  Heart Failure Follow-up Care:  Verify follow-up appointments per Patient Discharge Instructions. Confirm transportation arranged. Reconcile home medications with discharge medication list. Remove discontinued medications from use. Assist patient/caregiver to manage medications using pill box. Reinforce low sodium food selection Assessments: Vital signs and oxygen saturation at each visit. Assess home environment for safety concerns, caregiver support and availability of low-sodium foods. Consult Child psychotherapist, PT/OT, Dietitian, and CNA based on assessments. Perform comprehensive cardiopulmonary assessment. Notify MD for any change in condition or weight gain of 3 pounds in one day or 5 pounds in one week with symptoms. Daily Weights and Symptom Monitoring: Ensure patient has access to scales. Teach patient/caregiver to weigh daily before breakfast and after voiding using same scale and record.    Teach patient/caregiver to track weight and symptoms and when to notify Provider. Activity: Develop individualized activity plan with patient/caregiver.  Ancef 2 grams every 8 hours per [facility] pharmacy until 04/06/2016  Question Answer Comment  Heart Failure Follow-up Care [facility] ([facility] at [phone]   Home Health Visits Set up telemonitoring equipment to monitor daily vital signs,  weights and oxygen saturation  Obtain the following labs Basic Metabolic Panel   Lab frequency Weekly   Fax lab results to AHF Clinic at [phone]   Diet Low Sodium Heart Healthy   Fluid restrictions: 2000 mL Fluid   Initiate Heart Failure Clinic Diuretic Protocol to be used by [facility] only ( to be ordered by Heart Failure Team Providers Only) Yes      03/31/16 1031      Disposition   The patient will be discharged in stable condition to home. Discharge Instructions    (HEART FAILURE PATIENTS) Call MD:  Anytime you have any of the following symptoms: 1) 3 pound weight gain in 24 hours or 5 pounds in 1 week 2) shortness of breath, with or without a dry hacking cough 3) swelling in the hands, feet or stomach 4) if you have to sleep on extra pillows at night in order to breathe.    Complete by:  As directed    Diet - low sodium heart healthy    Complete by:  As directed    Increase activity slowly    Complete by:  As directed      Follow-up Information    [facility] Follow [facility].   Why:  A home health care nurse will assist you with your IV antibiotic Contact information: [address] [place] [address] [phone]        [facility] [address] [facility] Follow up on 04/03/2016.   Specialty:  Cardiology Why:  at 11:30AM for wound check and suture removal  Contact information: [address], [address] [place] [address] [phone]       Graciella Freer, PA-C Follow up on 04/13/2016.   Specialty:  Physician Assistant Why:  at 2:30pm for follow up with Dr. Alford Highland PA in the Heart Failure Clinic.  Contact information: [address] [place] [address] [phone]             Duration of Discharge Encounter: Greater than 35 minutes   Signed, Amy Clegg NP-C  03/31/2016, 11:05 AM

## 2016-03-31 NOTE — Progress Notes (Signed)
Pt has orders to be discharged. Discharge instructions given and pt has no additional questions at this time. Medication regimen reviewed and pt educated. Pt verbalized understanding and has no additional questions. Telemetry box removed. IV removed and site in good condition. PICC placed in stable condition. [facility] RN informed of discharge and home antibiotic needs carefully reviewed. Pt stable and waiting for transportation.   Jilda PandaBethany Tavaria Mackins RN

## 2016-03-31 NOTE — Discharge Instructions (Signed)
° ° °  Supplemental Discharge Instructions for  Pacemaker/Defibrillator Patients  Activity No heavy lifting or vigorous activity with your left/right arm for 6 to 8 weeks.  Do not raise your left/right arm above your head for one week.  Gradually raise your affected arm as drawn below.           _             03/31/16                         04/01/16                     04/02/16                  2/9/18_   WOUND CARE - Keep the wound area clean and dry.  Do not get this area wet for one week.   - The tape/steri-strips on your wound will fall off; do not pull them off.  No bandage is needed on the site.  DO  NOT apply any creams, oils, or ointments to the wound area. - If you notice any drainage or discharge from the wound, any swelling or bruising at the site, or you develop a fever > 101? F after you are discharged home, call the office at once.  Special Instructions - You are still able to use cellular telephones; use the ear opposite the side where you have your pacemaker/defibrillator.  Avoid carrying your cellular phone near your device. - When traveling through airports, show security personnel your identification card to avoid being screened in the metal detectors.  Ask the security personnel to use the hand wand. - Avoid arc welding equipment, TENS units (transcutaneous nerve stimulators).  Call the office for questions about other devices. - Avoid electrical appliances that are in poor condition or are not properly grounded. - Microwave ovens are safe to be near or to operate.  Additional information for defibrillator patients should your device go off: - If your device goes off ONCE and you feel fine afterward, notify the device clinic nurses. - If your device goes off ONCE and you do not feel well afterward, call 911. - If your device goes off TWICE, call 911. - If your device goes off THREE times in one day, call 911.  DO NOT DRIVE YOURSELF OR A FAMILY MEMBER WITH A DEFIBRILLATOR TO  THE HOSPITAL--CALL 911.

## 2016-03-31 NOTE — Progress Notes (Signed)
Patient ID: [name], male   DOB: 05/18/1955, 61 y.o.   MRN: [id number]     Advanced Heart Failure Rounding Note  Primary Cardiologist: Dr Jens Som Primary HF: Dr. Shirlee Latch   Subjective:    Admitted from ED 03/17/16 with recurrent low output HF with NYHA IIIb-IV symptoms.   He was on milrinone initially with diuresis, now off.  RHC 1/26 with Fick output/index 6.58/3.06  BCx 03/18/16 2/2 coag negative staph. ICD pocket noted to have serous drainage and exposed ICD. CT abdomen/pelvis did not show evidence for discitis or acute MSK changes Afebrile. WBCs WNL currently.   Repeat BCx 03/24/16 NGTD.   S/p ICD extraction 03/23/16 with placement of Temporary permanent pacemaker with previous pacer dependency due to CHB.   TEE on 1/26 with no evidence of vegetation.   Dual chamber ICD with His bundle pacing placed on 2/2.    Was orthostatic on 03/30/16, po lasix and Cleda Daub d/c'd yesterday.   RHC Procedural Findings (1/26, off milrinone): Hemodynamics (mmHg) RA mean 1 RV 36/8 PA 36/11, mean 21 PCWP mean 7 Oxygen saturations: PA 62% AO 96% Cardiac Output (Fick) 6.58  Cardiac Index (Fick) 3.06 Cardiac Output (Thermo) 6.62 Cardiac Index (Thermo) 3.08  Objective:   Weight Range: 198 lb 4.8 oz (89.9 kg) Body mass index is 27.66 kg/m.   Vital Signs:   Temp:  [97.4 F (36.3 C)-98 F (36.7 C)] 97.4 F (36.3 C) (02/06 0635) Pulse Rate:  [70] 70 (02/06 0635) Resp:  [16-18] 18 (02/06 0635) BP: (65-106)/(53-59) 103/59 (02/06 0635) SpO2:  [98 %-99 %] 98 % (02/06 0635) Weight:  [198 lb 4.8 oz (89.9 kg)] 198 lb 4.8 oz (89.9 kg) (02/06 0635) Last BM Date: 03/29/16  Weight change: Filed Weights   03/29/16 0453 03/30/16 0637 03/31/16 0635  Weight: 200 lb 14.4 oz (91.1 kg) 198 lb 4.8 oz (89.9 kg) 198 lb 4.8 oz (89.9 kg)    Intake/Output:   Intake/Output Summary (Last 24 hours) at 03/31/16 0755 Last data filed at 03/31/16 0600  Gross per 24 hour  Intake             1300 ml  Output              1975 ml  Net             -675 ml     Physical Exam: General: Elderly appearing. NAD. In bed. [name]        HEENT: Normal Neck: supple. JVP 5-6 cm. Carotids 2+ bilat; no bruits. No thyromegaly or nodule noted.  Cor: PMI nondisplaced. Regular. No M/G/R appreciated. No M/G/R    R upper chest steri strips. L upper chest dressing.  Lungs: CTAB, normal effort Abdomen: soft, NT, ND, no HSM. No bruits or masses. +BS   Extremities: no cyanosis, clubbing, rash. No edema.     Neuro: alert & oriented x 3, cranial nerves grossly intact. moves all 4 extremities w/o difficulty. Affect pleasant.  Telemetry: Reviewed, A sensed V paced.   Labs: CBC  Recent Labs  03/29/16 0417 03/30/16 0446  WBC 7.5 6.8  NEUTROABS 4.1 4.1  HGB 10.1* 10.4*  HCT 30.5* 31.4*  MCV 93.3 92.6  PLT 296 325   Basic Metabolic Panel  Recent Labs  03/30/16 0446 03/31/16 0429  NA 135 138  K 4.1 4.2  CL 101 99*  CO2 28 26  GLUCOSE 96 90  BUN 18 19  CREATININE 1.13 1.10  CALCIUM 9.0 9.2    BNP:  BNP (last 3 results)  Recent Labs  01/13/16 1008 01/27/16 1155 03/17/16 0311  BNP 256.0* 595.7* 894.5*      Medications:    Scheduled Medications: . amiodarone  200 mg Oral Daily  . aspirin EC  81 mg Oral Daily  . atorvastatin  40 mg Oral q1800  . bisoprolol  2.5 mg Oral Daily  .  ceFAZolin (ANCEF) IV  2 g Intravenous Q8H  . clopidogrel  75 mg Oral Daily  . digoxin  0.125 mg Oral QODAY  . docusate sodium  100 mg Oral BID  . famotidine  20 mg Oral BID  . feeding supplement (ENSURE ENLIVE)  237 mL Oral BID BM  . lidocaine  1 patch Transdermal Q24H  . losartan  25 mg Oral Daily  . magnesium oxide  400 mg Oral Daily  . mexiletine  200 mg Oral Q12H  . nicotine  21 mg Transdermal Daily  . PARoxetine  20 mg Oral Daily    Infusions:   PRN Medications: acetaminophen, albuterol, ALPRAZolam, cyclobenzaprine, hydrOXYzine, nitroGLYCERIN, ondansetron (ZOFRAN) IV, oxyCODONE-acetaminophen, sodium  chloride flush, traMADol  Assessment/Plan   1. Acute on chronic systolic CHF: Echo 11/2015 LVEF 15%, ischemic cardiomyopathy.  Initially on milrinone and diuresed, now off milrinone and back on po Lasix.  See RHC above => filling pressures optimized and CO preserved off milrinone.  Initial presentation may have been related to Coag negative Staph. Have begun LVAD work up. TEE 1/26; LVEF 15% mod-severe MR. He developed orthostatic hypotension after dual chamber ICD removed and temp-perm pacer placed, suspect due to loss of AV synchrony.  Now s/p dual chamber ICD with His bundle pacing, he is a-v sequentially paced and BP increased.  Unfortunately, was orthostatic again yesterday.  Lasix and spironolactone held. - Can restart Lasix 20 mg daily and spironolactone 12.5 mg daily tomorrow.    - Continue digoxin and losartan, change losartan to pm. - Continue bisoprolol 2.5 daily.  - On going work up for LVAD. May eventually be transplant candidate. Just stopped smoking PTA. VAD coordinator and Dr. Laneta SimmersBartle have seen. 2. CAD: Recent LHC in 12/17 with stable coronary disease  - Continue statin, ASA, Plavix.   3. Ascending aortic aneurysm: 4.1 cm 8/17. No evidence of dissection on CTA 03/17/16.  4. VT storm in 12/17: Continue amiodarone and Mexiletine.  - ICD has been re-implanted. 5. Low back pain, Chronic: Ortho has seen. Suspect MSK.  No discitis/Abscess on CT abdomen/pelvis. Pain continues to be an issue but seems to be doing better.  - Work with PT.  - Continue flexeril and tramadol prn.  6. 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  - s/p ICD extraction due to infected pocked.  - Now has had device replaced.  7. ID: Blood cultures (done for back pain, ?discitis given concerns about ICD pocket) returned 2/2 coag negative Staph.  ICD eroded with drainage from site.  - s/p Extraction 03/23/16 and device replacement on 2/2 as above.  - Continue cefazolin IV per ID.  Will be on until 04/06/16 (will need PICC, will have to place today in IR.) Blood cultures re-drawn 1/25. NGTD.  - TEE 1/26 no vegetation.  [facility].   PT consult pending. Home today with home health/PT PICC ordered, not able to do at bedside, will place in IR today.   Possible d/c today after PICC placed.   Little IshikawaErin E Smith, NP  03/31/2016 7:55  AM   Patient seen with NP, agree with the above note.  No complaints this morning.  Meds cut back due to orthostasis.  Will go home today after PICC placed in IR.  Needs home health for antibiotics and PT.  Followup with me in 10 - 14 days.  Cardiac meds for home: bisoprolol 2.5 daily, losartan 25 qhs, spironolactone 12.5 daily, Lasix 20 daily, cefazolin to 04/06/16, amiodarone 200 daily, ASA 81, atorvastatin 40, mexiletine 200 bid, Plavix 75, digoxin 0.125.   Marca Ancona 03/31/2016 8:13 AM

## 2016-03-31 NOTE — Progress Notes (Signed)
CARDIAC REHAB PHASE I   PRE:  Rate/Rhythm: 70 pacing    BP: lying 101/63, sitting 99/50    SaO2: 98 RA  MODE:  Ambulation: 130 ft   POST:  Rate/Rhythm: 70 pacing    BP: sitting 113/60     SaO2: 96 RA  Pt c/o dizziness upon sitting but BP stable. No more c/o dizziness. He was able to walk with RW. Main c/o was leg and arm fatigue. He continues to put heavy pressure on arms when getting to EOB. To recliner. He knows about daily wts and low sodium. I encouraged him to increase his activity and stay out of bed at home.  [id number]   [name] MassonRandi Kristan Mashayla [name] CES, ACSM 03/31/2016 2:25 PM

## 2016-03-31 NOTE — Procedures (Signed)
Successful placement of left brachial vein approach 21 cm single lumen midline PICC with tip terminating within the left axillary vein. EBL: None No immediate post procedural complication. The PICC line is ready for immediate use.  Katherina RightJay Toney Lizaola, MD Pager #: [phone]

## 2016-03-31 NOTE — Progress Notes (Signed)
Changed patients dressing on pacemaker removal site (left side of chest). Site clean, dry, and sutures visible. Patient tolerated dressing change and cleansing well.

## 2016-04-03 ENCOUNTER — Ambulatory Visit (INDEPENDENT_AMBULATORY_CARE_PROVIDER_SITE_OTHER): Payer: Medicaid Other | Admitting: *Deleted

## 2016-04-03 DIAGNOSIS — I5022 Chronic systolic (congestive) heart failure: Secondary | ICD-10-CM

## 2016-04-03 DIAGNOSIS — I255 Ischemic cardiomyopathy: Secondary | ICD-10-CM

## 2016-04-06 ENCOUNTER — Telehealth: Payer: Self-pay | Admitting: Internal Medicine

## 2016-04-06 LAB — CUP PACEART INCLINIC DEVICE CHECK
Battery Remaining Longevity: 57 mo
Brady Statistic AP VS Percent: 0.01 %
Brady Statistic AS VS Percent: 0 %
Date Time Interrogation Session: 20180209180843
HIGH POWER IMPEDANCE MEASURED VALUE: 58 Ohm
Implantable Lead Location: 753859
Implantable Lead Location: 753860
Implantable Lead Model: 5076
Implantable Pulse Generator Implant Date: 20180202
Lead Channel Impedance Value: 190 Ohm
Lead Channel Impedance Value: 361 Ohm
Lead Channel Impedance Value: 456 Ohm
Lead Channel Pacing Threshold Amplitude: 0.75 V
Lead Channel Pacing Threshold Amplitude: 0.75 V
Lead Channel Pacing Threshold Amplitude: 1.25 V
Lead Channel Pacing Threshold Pulse Width: 1 ms
Lead Channel Sensing Intrinsic Amplitude: 3.875 mV
Lead Channel Setting Pacing Amplitude: 3.25 V
Lead Channel Setting Pacing Amplitude: 3.5 V
Lead Channel Setting Pacing Pulse Width: 0.4 ms
Lead Channel Setting Sensing Sensitivity: 0.3 mV
MDC IDC LEAD IMPLANT DT: 20180202
MDC IDC LEAD IMPLANT DT: 20180202
MDC IDC LEAD IMPLANT DT: 20180202
MDC IDC LEAD LOCATION: 753858
MDC IDC MSMT BATTERY VOLTAGE: 3.05 V
MDC IDC MSMT LEADCHNL LV IMPEDANCE VALUE: 304 Ohm
MDC IDC MSMT LEADCHNL LV IMPEDANCE VALUE: 456 Ohm
MDC IDC MSMT LEADCHNL RA IMPEDANCE VALUE: 513 Ohm
MDC IDC MSMT LEADCHNL RA PACING THRESHOLD PULSEWIDTH: 0.4 ms
MDC IDC MSMT LEADCHNL RA SENSING INTR AMPL: 4.125 mV
MDC IDC MSMT LEADCHNL RV PACING THRESHOLD PULSEWIDTH: 0.4 ms
MDC IDC SET LEADCHNL LV PACING PULSEWIDTH: 1 ms
MDC IDC SET LEADCHNL RA PACING AMPLITUDE: 3.5 V
MDC IDC STAT BRADY AP VP PERCENT: 86.26 %
MDC IDC STAT BRADY AS VP PERCENT: 13.73 %
MDC IDC STAT BRADY RA PERCENT PACED: 86.25 %
MDC IDC STAT BRADY RV PERCENT PACED: 99.99 %

## 2016-04-06 NOTE — Telephone Encounter (Signed)
[employer] needed records corrected for active/inactive devices. Lead in question was extracted 03/23/16.

## 2016-04-06 NOTE — Progress Notes (Signed)
Wound check appointment. Steri-strips removed from implant site. Wound without redness or edema. Incision edges approximated, wound well healed. All sutures removed from explant site, ok per GT. Explant site incision edges approximated. Device checked in office w/Industry assistance. Normal device function. Thresholds, sensing, and impedances consistent with implant measurements. His lead tested with use of 12 lead ECG---only septal pacing noted. Device programmed at 3.5V for extra safety margin until 3 month visit. Histogram distribution appropriate for patient and level of activity. No mode switches or ventricular arrhythmias noted. Patient educated about wound care, arm mobility, lifting restrictions, shock plan. ROV in 3 months with SK.

## 2016-04-06 NOTE — Telephone Encounter (Signed)
New Message     Please call about the medical procedure on 03/23/16. Needs to know the status of the lead [phone]#[id number], is this lead still in use or has it expired?

## 2016-04-07 DIAGNOSIS — Z736 Limitation of activities due to disability: Secondary | ICD-10-CM

## 2016-04-13 ENCOUNTER — Ambulatory Visit (HOSPITAL_COMMUNITY)
Admission: RE | Admit: 2016-04-13 | Discharge: 2016-04-13 | Disposition: A | Discharge status: Home / Self Care | Payer: Medicaid Other | Source: Ambulatory Visit | Attending: Cardiology | Admitting: Cardiology

## 2016-04-13 VITALS — BP 138/70 | HR 82 | Wt 202.5 lb

## 2016-04-13 DIAGNOSIS — I255 Ischemic cardiomyopathy: Secondary | ICD-10-CM | POA: Insufficient documentation

## 2016-04-13 DIAGNOSIS — I712 Thoracic aortic aneurysm, without rupture: Secondary | ICD-10-CM | POA: Insufficient documentation

## 2016-04-13 DIAGNOSIS — I10 Essential (primary) hypertension: Secondary | ICD-10-CM

## 2016-04-13 DIAGNOSIS — I2510 Atherosclerotic heart disease of native coronary artery without angina pectoris: Secondary | ICD-10-CM | POA: Insufficient documentation

## 2016-04-13 DIAGNOSIS — Z87891 Personal history of nicotine dependence: Secondary | ICD-10-CM | POA: Diagnosis not present

## 2016-04-13 DIAGNOSIS — I4729 Other ventricular tachycardia: Secondary | ICD-10-CM

## 2016-04-13 DIAGNOSIS — Z95810 Presence of automatic (implantable) cardiac defibrillator: Secondary | ICD-10-CM | POA: Insufficient documentation

## 2016-04-13 DIAGNOSIS — I472 Ventricular tachycardia: Secondary | ICD-10-CM | POA: Diagnosis not present

## 2016-04-13 DIAGNOSIS — Z5189 Encounter for other specified aftercare: Secondary | ICD-10-CM | POA: Insufficient documentation

## 2016-04-13 DIAGNOSIS — F17200 Nicotine dependence, unspecified, uncomplicated: Secondary | ICD-10-CM

## 2016-04-13 DIAGNOSIS — I5022 Chronic systolic (congestive) heart failure: Secondary | ICD-10-CM

## 2016-04-13 DIAGNOSIS — Z7982 Long term (current) use of aspirin: Secondary | ICD-10-CM | POA: Diagnosis not present

## 2016-04-13 DIAGNOSIS — F4310 Post-traumatic stress disorder, unspecified: Secondary | ICD-10-CM | POA: Insufficient documentation

## 2016-04-13 DIAGNOSIS — Z79899 Other long term (current) drug therapy: Secondary | ICD-10-CM | POA: Diagnosis not present

## 2016-04-13 LAB — BASIC METABOLIC PANEL
ANION GAP: 10 (ref 5–15)
BUN: 21 mg/dL — AB (ref 6–20)
CHLORIDE: 103 mmol/L (ref 101–111)
CO2: 26 mmol/L (ref 22–32)
Calcium: 9.7 mg/dL (ref 8.9–10.3)
Creatinine, Ser: 1.27 mg/dL — ABNORMAL HIGH (ref 0.61–1.24)
GFR, EST NON AFRICAN AMERICAN: 58 mL/min — AB (ref >60)
Glucose, Bld: 94 mg/dL (ref 65–99)
POTASSIUM: 5.1 mmol/L (ref 3.5–5.1)
SODIUM: 139 mmol/L (ref 135–145)

## 2016-04-13 LAB — CBC
HCT: 37.4 % — ABNORMAL LOW (ref 39.0–52.0)
HEMOGLOBIN: 12.3 g/dL — AB (ref 13.0–17.0)
MCH: 31 pg (ref 26.0–34.0)
MCHC: 32.9 g/dL (ref 30.0–36.0)
MCV: 94.2 fL (ref 78.0–100.0)
PLATELETS: 310 10*3/uL (ref 150–400)
RBC: 3.97 MIL/uL — AB (ref 4.22–5.81)
RDW: 14.1 % (ref 11.5–15.5)
WBC: 6.6 10*3/uL (ref 4.0–10.5)

## 2016-04-13 MED ORDER — SPIRONOLACTONE 25 MG PO TABS
25.0000 mg | ORAL_TABLET | Freq: Every day | ORAL | 6 refills | Status: DC
Start: 2016-04-13 — End: 2016-09-09

## 2016-04-13 NOTE — Patient Instructions (Signed)
Routine lab work today. Will notify you of abnormal results, otherwise no news is good news!  Will repeat labwork at Dr. Clinton GallantVan Dam's appointment next week.  INCREASE Spironolactone to 25 mg (1 whole tablet) once daily.  Follow up 4-6 weeks with [name].  Do the following things EVERYDAY: 1) Weigh yourself in the morning before breakfast. Write it down and keep it in a log. 2) Take your medicines as prescribed 3) Eat low salt foods-Limit salt (sodium) to 2000 mg per day.  4) Stay as active as you can everyday 5) Limit all fluids for the day to less than 2 liters

## 2016-04-13 NOTE — Progress Notes (Signed)
Cardiology: Dr. Jens Somrenshaw HF Cardiology: [name]    Advanced Heart Failure Clinic Note   61 yo with history of smoking, CAD s/p multiple PCIs, chronic systolic CHF/ischemic cardiomyopathy presents for HF clinic followup.  Cardiac cath in 9/17 showed stable anatomy, no intervention.  EF has been markedly reduced for several years now.  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 PICC was placed and low cardiac output confirmed.  He was started on milrinone and diuresed.  He had CRT upgrade of his Medtronic device.    He was admitted again in 12/17 with multiple ICD discharges in the setting of VT storm.  He was started on amiodarone and mexiletine.  He has had PTSD symptoms since that time. He has had no further VT.  In the hospital, LV lead was turned off (?pro-arrhythmic).  Coronary angiography was done, showing no change compared to 9/17.    Admitted 1/23 - 03/31/16 with A/C CHF.  BCx drawn with worsening back pain and PICC line in place. Found to have Coag negative staph 2/2 BCx. Started on ABX. Found to have ICD infection with ulceration and drainage from site.  ICD removed 03/23/16 and replaced 03/27/16 with dual chamber ICD with His bundle pacing. ID follow up arranged. IV ABX continued until 04/06/16. Discharge weight 198 lbs.  He returns today for post hospital follow up.  Feeling good overall. Not really having any SOB. Denies fevers or chills. Still having some back pain, but feeling better somewhat. Sees chiropractor tomorrow, says this really helps him. Sleeping better. Appetite good. Having occasional lightheadedness with rapid standing and sometimes after his morning medications. No palpitations. No draining or swelling around ICD site.  No drainage.  PICC line remains in place.  Has follow up with ID next month.   Optivol: Calibrating with recent replacement.  No VT/VF noted. Pt activity  between 1-2 hrs daily.   TEE 03/21/15 with LVEF 15% with mod/severe MR.   RHC 03/20/2016 RA mean 1 RV 36/8 PA 36/11, mean 21 PCWP mean 7 Oxygen saturations: PA 62% AO 96% Cardiac Output (Fick) 6.58  Cardiac Index (Fick) 3.06 Cardiac Output (Thermo) 6.62 Cardiac Index (Thermo) 3.08  Labs (10/17): hgb 15.8, K 4.3, creatinine 1.11 => 0.98, AST 78 => 43, ALT 105 => 61, digoxin 0.4 Labs (12/17): digoxin < 0.5, mg 1.8, K 4.4, creatinine 1.23  PMH: 1. H/o smoking: PFTs (6/17) with minimal obstruction on PFTs. Quit 12/17.  2. Coronary artery disease: Multiple PCIs in the past.  - LHC/RHC (9/17): 85% ostial D2, 99% ostial OM1 (no change from prior) with patent LAD, LCX, OM2, and RCA stents; mean RA 8, PA 58/27 mean 39, mean PCWP 29, CI 2.25, PVR 2.1.  - LHC (12/17): No change compared to 9/17.  3. Chronic systolic CHF: Ischemic cardiomyopathy.   - Echo (10/17): EF 15%, moderate MR, mild to moderate AI, mildly decreased RV systolic function.  - Medtronic CRT-D device => LV lead turned off due to concern for possible pro-arrhythmia.  - CPX (11/17): peak VO2 20.6, VE/VCO2 slope 38, RER 0.97.  Submaximal, probably mild to moderate HF limitation.  4. 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  5. H/o TIA 6. Ascending aortic aneurysm: 4.1 cm on CTA chest in 8/17.  7. Atrial tachycardia: Noted on device interrogation.  8. Ventricular tachycardia: Admitted in 12/17 with VT storm and  multiple shocks.  LV lead turned off due to concern for pro-arrhythmia.   ROS: All systems reviewed and negative except as per HPI.   Social History   Social History  . Marital status: Legally Separated    Spouse name: N/A  . Number of children: N/A  . Years of education: N/A   Occupational History  . Not on file.   Social History Main Topics  . Smoking status: Former Smoker    Packs/day: 1.00    Years: 47.00    Types: Cigarettes    Quit date: 11/20/2015  . Smokeless tobacco: Never Used  .  Alcohol use 3.6 oz/week    6 Cans of beer per week  . Drug use: No  . Sexual activity: Not Currently   Other Topics Concern  . Not on file   Social History Narrative   Married; full time.    Family History  Problem Relation Age of Onset  . Cancer Father   . Cancer Sister     twin sister and only one has cancer, unknown what kind  . Hypertension Brother   . Cancer      family hx  . Heart attack Neg Hx   . Stroke Neg Hx    Current Outpatient Prescriptions  Medication Sig Dispense Refill  . albuterol (PROAIR HFA) 108 (90 Base) MCG/ACT inhaler Inhale 2 puffs into the lungs every 4 (four) hours as needed for wheezing or shortness of breath.    . ALPRAZolam (XANAX) 0.25 MG tablet Take 1 tablet (0.25 mg total) by mouth at bedtime as needed for anxiety. F/u with PCP for refills. 30 tablet 0  . amiodarone (PACERONE) 200 MG tablet Take 1 tablet (200 mg total) by mouth daily. 30 tablet 3  . aspirin EC 81 MG tablet Take 81 mg by mouth daily.    [name] atorvastatin (LIPITOR) 40 MG tablet Take 1 tablet (40 mg total) by mouth daily. 30 tablet 5  . bisoprolol (ZEBETA) 5 MG tablet Take 0.5 tablets (2.5 mg total) by mouth at bedtime. 15 tablet 11  . clopidogrel (PLAVIX) 75 MG tablet Take 1 tablet (75 mg total) by mouth daily. 30 tablet 5  . cyclobenzaprine (FLEXERIL) 10 MG tablet Take 1 tablet (10 mg total) by mouth 2 (two) times daily as needed for muscle spasms. 20 tablet 0  . digoxin (LANOXIN) 0.125 MG tablet Take one tablet (0.125 mg) by mouth once every other day 30 tablet 3  . famotidine (PEPCID) 20 MG tablet Take 1 tablet (20 mg total) by mouth 2 (two) times daily. 60 tablet 6  . furosemide (LASIX) 40 MG tablet Take 0.5 tablets (20 mg total) by mouth daily. 30 tablet 11  . hydrOXYzine (VISTARIL) 25 MG capsule Take 25 mg by mouth at bedtime as needed (sleep).    . losartan (COZAAR) 25 MG tablet Take 1 tablet (25 mg total) by mouth at bedtime. 30 tablet 6  . magnesium oxide (MAG-OX) 400 (241.3 Mg)  MG tablet Take 1 tablet (400 mg total) by mouth daily. 30 tablet 6  . mexiletine (MEXITIL) 200 MG capsule Take 1 capsule (200 mg total) by mouth every 12 (twelve) hours. 60 capsule 3  . nicotine (NICODERM CQ - DOSED IN MG/24 HOURS) 21 mg/24hr patch Place 21 mg onto the skin daily.    . nitroGLYCERIN (NITROSTAT) 0.4 MG SL tablet Place 1 tablet (0.4 mg total) under the tongue every 5 (five) minutes as needed. For chest pain 25 tablet 11  .  oxyCODONE-acetaminophen (PERCOCET/ROXICET) 5-325 MG tablet Take 1-2 tablets by mouth every 6 (six) hours as needed for severe pain. 10 tablet 0  . PARoxetine (PAXIL) 10 MG tablet Take 1 tablet (10 mg total) by mouth daily. 30 tablet 3  . spironolactone (ALDACTONE) 25 MG tablet Take 0.5 tablets (12.5 mg total) by mouth daily. 30 tablet 6   No current facility-administered medications for this encounter.    BP 138/70 (BP Location: Right Arm, Patient Position: Sitting, Cuff Size: Normal)   Pulse 82   Wt 202 lb 8 oz (91.9 kg)   SpO2 98%   BMI 28.24 kg/m    Wt Readings from Last 3 Encounters:  04/13/16 202 lb 8 oz (91.9 kg)  03/31/16 198 lb 4.8 oz (89.9 kg)  03/10/16 200 lb (90.7 kg)    General: NAD Neck: No JVD appreciated. No thyromegaly or thyroid nodule.  Lungs: Cleear, normal effort.  CV: Nondisplaced PMI.  Heart regular S1/S2, no S3/S4, no murmur.  No peripheral edema.  No carotid bruit.  Normal pedal pulses.  Abdomen: Soft, NT, ND, no HSM. No bruits or masses. +BS .  Skin: Intact without lesions or rashes.  Neurologic: Alert and oriented x 3.  Psych: Normal affect. Extremities: No clubbing or cyanosis.  HEENT: Normal.   Assessment/Plan: 1. Chronic systolic CHF: With low cardiac output noted on during admission in 10/17.  Ischemic cardiomyopathy, had upgrade to Medtronic BiV ICD in 10/17.  However, with VT storm in 12/17, the LV lead was turned off due to concerns for pro-arrhythmia.  Volume status stable on exam. NYHA class II-III symptoms. CPX  was submaximal but suggests mild to moderate HF limitation.  - Cardiac index relatively preserved on RHC this past admission.  - Continue Lasix 40 mg daily.   - Continue losartan 25 mg daily. Consider trying Entresto back at next visit.  - Increase spironolactone 25 mg daily.   - Continue digoxin 0.125 mg every other day.  - Continue bisoprolol 2.5 qhs.  - May eventually need to consider for LVAD. Has 3 children and lives with sister.  Will follow his clinical trajectory closely.  - Will refer to cardiac rehab once able to drive.     - Reinforced fluid restriction to < 2 L daily, sodium restriction to less than 2000 mg daily, and the importance of daily weights.   2.  CAD:  - No chest pain. Recent LHC in 12/17 with stable coronary disease.  - Continue ASA 81, Plavix, and statin.  3. Smoking:  - Remains abstinent. Congratulated.   4. 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  Suspect this contributes to HF with loss of AV synchrony (and also was bradycardic).  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  He was BiV paced after 10/17 admission, but LV lead was turned off due to concern for pro-arrhythmia.  - S/p ICD extraction with infection. Replaced 03/27/16 with dual chamber ICD with His bundle pacing.  5. Ascending aortic aneurysm: 4.1 cm in 8/17. Needs repeat CT in 8/18. No change to current plan.  6. Ventricular tachycardia: VT storm in 12/17.   - Continue amiodarone and mexiletine. No VT/VF by Optivol.  - No new coronary disease on cath.  - Most ominous would be electromechanical trigger with worsening LF function.  - No driving x 6 months (1/61).  7. PTSD: Patient is on paroxetine due to significant anxiety around shocks.  He has a hard time sleeping.  -  Continue prn xanax.  Meds as above. Follow up with Dr. Shirlee Latch 4-6 weeks.  PICC line removed in office by IV team this afternoon without complication.   Graciella Freer, PA-C  04/13/2016   Total time spent > 25  minutes. Over half that spent discussing the above.

## 2016-04-14 ENCOUNTER — Telehealth (HOSPITAL_COMMUNITY): Payer: Self-pay | Admitting: Cardiology

## 2016-04-14 DIAGNOSIS — I509 Heart failure, unspecified: Secondary | ICD-10-CM

## 2016-04-14 NOTE — Telephone Encounter (Signed)
-----   Message from Graciella FreerMichael Andrew Tillery, PA-C sent at 04/14/2016  8:25 AM EST ----- K borderline high.   Spironolactone increased.   Per [name], Pt needs repeat BMET THIS week.  Ideally Thursday.     Casimiro NeedleMichael [address]"Andy" Potterillery, PA-C 04/14/2016 8:25 AM

## 2016-04-14 NOTE — Telephone Encounter (Signed)
Patient aware. Voiced understanding will repeat labs 2/22

## 2016-04-16 ENCOUNTER — Ambulatory Visit (HOSPITAL_COMMUNITY)
Admission: RE | Admit: 2016-04-16 | Discharge: 2016-04-16 | Disposition: A | Discharge status: Home / Self Care | Payer: Medicaid Other | Source: Ambulatory Visit | Attending: Internal Medicine | Admitting: Internal Medicine

## 2016-04-16 DIAGNOSIS — I509 Heart failure, unspecified: Secondary | ICD-10-CM | POA: Insufficient documentation

## 2016-04-16 LAB — BASIC METABOLIC PANEL
ANION GAP: 9 (ref 5–15)
BUN: 25 mg/dL — AB (ref 6–20)
CO2: 24 mmol/L (ref 22–32)
Calcium: 9.5 mg/dL (ref 8.9–10.3)
Chloride: 104 mmol/L (ref 101–111)
Creatinine, Ser: 1.37 mg/dL — ABNORMAL HIGH (ref 0.61–1.24)
GFR, EST NON AFRICAN AMERICAN: 53 mL/min — AB (ref >60)
Glucose, Bld: 90 mg/dL (ref 65–99)
POTASSIUM: 4.5 mmol/L (ref 3.5–5.1)
SODIUM: 137 mmol/L (ref 135–145)

## 2016-04-17 ENCOUNTER — Other Ambulatory Visit (HOSPITAL_COMMUNITY): Payer: Self-pay | Admitting: Cardiology

## 2016-04-20 ENCOUNTER — Ambulatory Visit: Payer: Self-pay | Admitting: Cardiology

## 2016-04-22 ENCOUNTER — Other Ambulatory Visit: Payer: Self-pay | Admitting: Internal Medicine

## 2016-04-22 DIAGNOSIS — R0602 Shortness of breath: Secondary | ICD-10-CM

## 2016-04-23 ENCOUNTER — Encounter: Payer: Self-pay | Admitting: Infectious Disease

## 2016-04-23 ENCOUNTER — Ambulatory Visit (INDEPENDENT_AMBULATORY_CARE_PROVIDER_SITE_OTHER): Payer: Medicaid Other | Admitting: Infectious Disease

## 2016-04-23 VITALS — BP 113/73 | HR 85 | Temp 97.7°F | Ht 71.0 in | Wt 204.0 lb

## 2016-04-23 DIAGNOSIS — T827XXD Infection and inflammatory reaction due to other cardiac and vascular devices, implants and grafts, subsequent encounter: Secondary | ICD-10-CM | POA: Diagnosis not present

## 2016-04-23 DIAGNOSIS — R7881 Bacteremia: Secondary | ICD-10-CM | POA: Diagnosis not present

## 2016-04-23 DIAGNOSIS — Z95810 Presence of automatic (implantable) cardiac defibrillator: Secondary | ICD-10-CM | POA: Diagnosis not present

## 2016-04-23 DIAGNOSIS — I5022 Chronic systolic (congestive) heart failure: Secondary | ICD-10-CM

## 2016-04-23 DIAGNOSIS — B957 Other staphylococcus as the cause of diseases classified elsewhere: Secondary | ICD-10-CM

## 2016-04-23 LAB — BASIC METABOLIC PANEL WITH GFR
BUN: 21 mg/dL (ref 7–25)
CHLORIDE: 106 mmol/L (ref 98–110)
CO2: 26 mmol/L (ref 20–31)
Calcium: 9.5 mg/dL (ref 8.6–10.3)
Creat: 1.28 mg/dL — ABNORMAL HIGH (ref 0.70–1.25)
GFR, Est African American: 68 mL/min (ref >=60)
GFR, Est Non African American: 59 mL/min — ABNORMAL LOW (ref >=60)
GLUCOSE: 89 mg/dL (ref 65–99)
POTASSIUM: 4.4 mmol/L (ref 3.5–5.3)
Sodium: 139 mmol/L (ref 135–146)

## 2016-04-23 NOTE — Progress Notes (Signed)
Chief complaint: folowup for ICD infection   Subjective:    Patient ID: [name], male    DOB: [birth year], 61 y.o.   MRN: [id number]  HPI  61 y.o. male with  ICD pocket infection and Bacteremia with MS Staph.  Epi (not staph aureus). He Not have vegetations on his leads and his bacteremia appears to have been clearing rapidly. He cleared the bacteremia rapidly from blood, his ICD was removed and post ICD blood culures were negative and he was reimplanted. He finished 2 weeks of IV cefazolin. He has finished his 2 weeks of IV ancef and PICC is out. He is without fevers chills or systemic symptoms.      Past Medical History:  Diagnosis Date  . Anxiety   . Automatic implantable cardioverter-defibrillator in situ    a. Medtronic ICD.  [name] CAD (coronary artery disease)    a.  Severe LAD stenosis 2/2 acute thrombus - BMS 2009;  b. 06/01/11 Cath - LAD 20 isr, 19m (3.0x16 Veri-flex BMS), OM1 100p, EF 20-25%;  c. 07/2012 Abnl Cardiolite;  d. 08/2012 Cath/PCI: LM nl, LAD patent stents, D2 80-90 (jailed), LCX 90 p/m (4.0x24 Promus Premier DES), OM1 100, OM2 80-90p (3.0x20 Promus Premier DES), RCA patent mid stent, 40d, EF 25%.  e. 10/29/15 LHC stable disease with patent stents  . Chronic systolic CHF (congestive heart failure), NYHA class 3 (HCC) - low output state 12/12/2014  . Depression   . Dyspnea   . History of rheumatic fever 1962  . HLD (hyperlipidemia) mixed  . HTN (hypertension)   . Ischemic cardiomyopathy    a.  EF 30-35% 2010;  b.  EF 20-25% by LV gram 08/2012. c. EF 15% by echo 11/2014.  [name] Myocardial infarction 1998; 2002; ~ 2010  . Paroxysmal ventricular tachycardia (HCC)    a. VFlutter  CL 210 msec  Rx shock 04/2011  . PVC's (premature ventricular contractions)   . RBBB   . TIA (transient ischemic attack)    a. Dx 11/2014, continued on ASA/Plavix.    Past Surgical History:  Procedure Laterality Date  . CARDIAC CATHETERIZATION N/A 10/29/2015   Procedure: Right/Left Heart Cath and  Coronary Angiography;  Surgeon: Peter M Swaziland, MD;  Location: [facility] INVASIVE CV LAB;  Service: Cardiovascular;  Laterality: N/A;  . CARDIAC CATHETERIZATION N/A 03/20/2016   Procedure: Right Heart Cath;  Surgeon: Laurey Morale, MD;  Location: [facility] INVASIVE CV LAB;  Service: Cardiovascular;  Laterality: N/A;  . CARDIAC DEFIBRILLATOR PLACEMENT     MDT single chamber primary prevention ICD implanted by Dr Graciela Husbands as part of MASTER study  . EP IMPLANTABLE DEVICE N/A 12/11/2015   Procedure: BiV Upgrade;  Surgeon: [name], MD;  Location: [facility] INVASIVE CV LAB;  Service: Cardiovascular;  Laterality: N/A;  . EP IMPLANTABLE DEVICE N/A 12/11/2015   Procedure: Pocket Revision/Relocation;  Surgeon: [name], MD;  Location: [facility] INVASIVE CV LAB;  Service: Cardiovascular;  Laterality: N/A;  . EP IMPLANTABLE DEVICE N/A 12/11/2015   Procedure: Lead Revision/Repair;  Surgeon: [name], MD;  Location: [facility] INVASIVE CV LAB;  Service: Cardiovascular;  Laterality: N/A;  . EP IMPLANTABLE DEVICE N/A 03/23/2016   Procedure: Temporary placement of pacemaker lead and generator;  Surgeon: Marinus Maw, MD;  Location: [facility] OR;  Service: Cardiovascular;  Laterality: N/A;  . EP IMPLANTABLE DEVICE N/A 03/27/2016   Procedure: ICD Implant;  Surgeon: [name], MD;  Location: [facility] INVASIVE CV LAB;  Service: Cardiovascular;  Laterality: N/A;  . GENERATOR REMOVAL N/A 03/23/2016   Procedure: DEVICE REMOVAL;  Surgeon: Marinus MawGregg W Taylor, MD;  [facility];  Service: Cardiovascular;  Laterality: N/A;  PVT to back up  . HERNIA REPAIR     "w/gallbladder OR" (08/29/2012)  . IR GENERIC HISTORICAL  03/31/2016   IR US GUIDE VASC ACCESS LEFT 03/31/2016 Simonne ComeJohn Watts, MD [facility]-INTERV RAD  . IR GENERIC HISTORICAL  03/31/2016   IR FLUORO GUIDE CV MIDLINE PICC LEFT 03/31/2016 Simonne ComeJohn Watts, MD [facility]INTER[facility]  . LAPAROSCOPIC CHOLECYSTECTOMY    . LEFT HEART CATHETERIZATION WITH CORONARY ANGIOGRAM N/A 06/08/2011   Procedure: LEFT HEART CATHETERIZATION WITH  CORONARY ANGIOGRAM;  Surgeon: Dolores Pattyaniel R Bensimhon, MD;  [facility];  Service: Cardiovascular;  Laterality: N/A;  . PERCUTANEOUS CORONARY STENT INTERVENTION (PCI-S) N/A 06/01/2011   Procedure: PERCUTANEOUS CORONARY STENT INTERVENTION (PCI-S);  Surgeon: Peter M SwazilandJordan, MD;  [facility];  Service: Cardiovascular;  Laterality: N/A;  . PERCUTANEOUS CORONARY STENT INTERVENTION (PCI-S) N/A 08/29/2012   Procedure: PERCUTANEOUS CORONARY STENT INTERVENTION (PCI-S);  Surgeon: Peter M SwazilandJordan, MD;  [facility];  Service: Cardiovascular;  Laterality: N/A;  . PERIPHERAL VASCULAR CATHETERIZATION  12/11/2015   Procedure: Upper Extremity Venography;  Surgeon: Duke SalviaSteven C Klein, MD;  [facility];  Service: Cardiovascular;;  . TEE WITHOUT CARDIOVERSION N/A 03/20/2016   Procedure: TRANSESOPHAGEAL ECHOCARDIOGRAM (TEE);  Surgeon: Vesta MixerPhilip J Nahser, MD;  [facility];  Service: Cardiovascular;  Laterality: N/A;  . TEE WITHOUT CARDIOVERSION N/A 03/23/2016   Procedure: TRANSESOPHAGEAL ECHOCARDIOGRAM (TEE);  Surgeon: Marinus MawGregg W Taylor, MD;  [facility];  Service: Cardiovascular;  Laterality: N/A;    Family History  Problem Relation Age of Onset  . Cancer Father   . Cancer Sister     twin sister and only one has cancer, unknown what kind  . Hypertension Brother   . Cancer      family hx  . Heart attack Neg Hx   . Stroke Neg Hx       Social History   Social History  . Marital status: Legally Separated    Spouse name: N/A  . Number of children: N/A  . Years of education: N/A   Social History Main Topics  . Smoking status: Former Smoker    Packs/day: 1.00    Years: 47.00    Types: Cigarettes    Quit date: 11/20/2015  . Smokeless tobacco: Never Used  . Alcohol use 3.6 oz/week    6 Cans of beer per week  . Drug use: No  . Sexual activity: Not Currently   Other Topics Concern  . None   Social History Narrative   Married; full time.     No Known  Allergies   Current Outpatient Prescriptions:  .  amiodarone (PACERONE) 200 MG tablet, Take 1 tablet (200 mg total) by mouth daily., Disp: 30 tablet, Rfl: 3 .  aspirin EC 81 MG tablet, Take 81 mg by mouth daily., Disp: , Rfl:  .  atorvastatin (LIPITOR) 40 MG tablet, Take 1 tablet (40 mg total) by mouth daily., Disp: 30 tablet, Rfl: 5 .  bisoprolol (ZEBETA) 5 MG tablet, Take 0.5 tablets (2.5 mg total) by mouth at bedtime., Disp: 15 tablet, Rfl: 11 .  clopidogrel (PLAVIX) 75 MG tablet, Take 1 tablet (75 mg total) by mouth daily., Disp: 30 tablet, Rfl: 5 .  digoxin (LANOXIN) 0.125 MG tablet, Take one tablet (0.125 mg) by mouth once every other day, Disp:  30 tablet, Rfl: 3 .  famotidine (PEPCID) 20 MG tablet, Take 1 tablet (20 mg total) by mouth 2 (two) times daily., Disp: 60 tablet, Rfl: 6 .  furosemide (LASIX) 40 MG tablet, Take 0.5 tablets (20 mg total) by mouth daily., Disp: 30 tablet, Rfl: 11 .  hydrOXYzine (VISTARIL) 25 MG capsule, Take 25 mg by mouth at bedtime as needed (sleep)., Disp: , Rfl:  .  losartan (COZAAR) 25 MG tablet, Take 1 tablet (25 mg total) by mouth at bedtime., Disp: 30 tablet, Rfl: 6 .  magnesium oxide (MAG-OX) 400 (241.3 Mg) MG tablet, Take 1 tablet (400 mg total) by mouth daily., Disp: 30 tablet, Rfl: 6 .  mexiletine (MEXITIL) 200 MG capsule, Take 1 capsule (200 mg total) by mouth every 12 (twelve) hours., Disp: 60 capsule, Rfl: 3 .  nitroGLYCERIN (NITROSTAT) 0.4 MG SL tablet, Place 1 tablet (0.4 mg total) under the tongue every 5 (five) minutes as needed. For chest pain, Disp: 25 tablet, Rfl: 11 .  PARoxetine (PAXIL) 10 MG tablet, Take 1 tablet (10 mg total) by mouth daily., Disp: 30 tablet, Rfl: 3 .  spironolactone (ALDACTONE) 25 MG tablet, Take 1 tablet (25 mg total) by mouth daily., Disp: 30 tablet, Rfl: 6 .  albuterol (PROAIR HFA) 108 (90 Base) MCG/ACT inhaler, Inhale 2 puffs into the lungs every 4 (four) hours as needed for wheezing or shortness of breath., Disp: ,  Rfl:  .  ALPRAZolam (XANAX) 0.25 MG tablet, Take 1 tablet (0.25 mg total) by mouth at bedtime as needed for anxiety. F/u with PCP for refills. (Patient not taking: Reported on 04/23/2016), Disp: 30 tablet, Rfl: 0    Review of Systems  Constitutional: Negative for chills, diaphoresis and fever.  HENT: Negative for congestion, hearing loss, sore throat and tinnitus.   Respiratory: Negative for cough, shortness of breath and wheezing.   Cardiovascular: Negative for chest pain, palpitations and leg swelling.  Gastrointestinal: Negative for abdominal pain, blood in stool, constipation, diarrhea, nausea and vomiting.  Genitourinary: Negative for dysuria, flank pain and hematuria.  Musculoskeletal: Negative for back pain and myalgias.  Skin: Negative for rash.  Neurological: Negative for dizziness, weakness and headaches.  Hematological: Does not bruise/bleed easily.  Psychiatric/Behavioral: Negative for suicidal ideas. The patient is not nervous/anxious.        Objective:   Physical Exam  Constitutional: He is oriented to person, place, and time. He appears well-developed and well-nourished. No distress.  HENT:  Head: Normocephalic and atraumatic.  Mouth/Throat: No oropharyngeal exudate.  Eyes: Conjunctivae and EOM are normal. No scleral icterus.  Neck: Normal range of motion. Neck supple.  Cardiovascular: Normal rate, regular rhythm and normal heart sounds.   No murmur heard. Pulmonary/Chest: Effort normal. No respiratory distress. He has no wheezes. He has no rales.  Abdominal: Soft. He exhibits no distension. There is no tenderness.  Musculoskeletal: He exhibits no edema or tenderness.  Neurological: He is alert and oriented to person, place, and time. He exhibits normal muscle tone. Coordination normal.  Skin: Skin is warm and dry. No rash noted. He is not diaphoretic. No erythema. No pallor.  Psychiatric: He has a normal mood and affect. His behavior is normal. Judgment and thought  content normal.   Skin   New pacemaker site:  04/23/16:         Old pacemaker site :              Virg[place] Organ & Plan:-=/    ICD infection with bacteremia: check  surveillance blood cultures. HOpefully they will have no growth and he can return pRN   CHF: he needs BMP w GFR I will reorder since my lab has to release labs undermy name and forward to cardiologist

## 2016-04-24 ENCOUNTER — Other Ambulatory Visit: Payer: Self-pay | Admitting: Internal Medicine

## 2016-04-30 LAB — CULTURE, BLOOD (SINGLE)
Organism ID, Bacteria: NO GROWTH
Organism ID, Bacteria: NO GROWTH

## 2016-05-07 ENCOUNTER — Encounter: Payer: Self-pay | Admitting: Internal Medicine

## 2016-05-20 ENCOUNTER — Ambulatory Visit (HOSPITAL_COMMUNITY)
Admission: RE | Admit: 2016-05-20 | Discharge: 2016-05-20 | Disposition: A | Discharge status: Home / Self Care | Payer: Medicaid Other | Source: Ambulatory Visit | Attending: Cardiology | Admitting: Cardiology

## 2016-05-20 ENCOUNTER — Encounter (HOSPITAL_COMMUNITY): Payer: Self-pay

## 2016-05-20 ENCOUNTER — Telehealth (HOSPITAL_COMMUNITY): Payer: Self-pay | Admitting: Pharmacist

## 2016-05-20 VITALS — BP 111/59 | HR 70 | Wt 213.1 lb

## 2016-05-20 DIAGNOSIS — Z79899 Other long term (current) drug therapy: Secondary | ICD-10-CM | POA: Diagnosis not present

## 2016-05-20 DIAGNOSIS — I4510 Unspecified right bundle-branch block: Secondary | ICD-10-CM | POA: Insufficient documentation

## 2016-05-20 DIAGNOSIS — Z87891 Personal history of nicotine dependence: Secondary | ICD-10-CM | POA: Diagnosis not present

## 2016-05-20 DIAGNOSIS — I5022 Chronic systolic (congestive) heart failure: Secondary | ICD-10-CM | POA: Insufficient documentation

## 2016-05-20 DIAGNOSIS — Z7982 Long term (current) use of aspirin: Secondary | ICD-10-CM | POA: Diagnosis not present

## 2016-05-20 DIAGNOSIS — Z7902 Long term (current) use of antithrombotics/antiplatelets: Secondary | ICD-10-CM | POA: Insufficient documentation

## 2016-05-20 DIAGNOSIS — Z95810 Presence of automatic (implantable) cardiac defibrillator: Secondary | ICD-10-CM | POA: Insufficient documentation

## 2016-05-20 DIAGNOSIS — F4310 Post-traumatic stress disorder, unspecified: Secondary | ICD-10-CM | POA: Insufficient documentation

## 2016-05-20 DIAGNOSIS — I712 Thoracic aortic aneurysm, without rupture: Secondary | ICD-10-CM | POA: Insufficient documentation

## 2016-05-20 DIAGNOSIS — I255 Ischemic cardiomyopathy: Secondary | ICD-10-CM | POA: Diagnosis not present

## 2016-05-20 DIAGNOSIS — I2510 Atherosclerotic heart disease of native coronary artery without angina pectoris: Secondary | ICD-10-CM | POA: Diagnosis not present

## 2016-05-20 DIAGNOSIS — I493 Ventricular premature depolarization: Secondary | ICD-10-CM | POA: Diagnosis not present

## 2016-05-20 DIAGNOSIS — I472 Ventricular tachycardia: Secondary | ICD-10-CM | POA: Insufficient documentation

## 2016-05-20 LAB — COMPREHENSIVE METABOLIC PANEL
ALBUMIN: 4 g/dL (ref 3.5–5.0)
ALK PHOS: 51 U/L (ref 38–126)
ALT: 22 U/L (ref 17–63)
AST: 29 U/L (ref 15–41)
Anion gap: 6 (ref 5–15)
BILIRUBIN TOTAL: 0.6 mg/dL (ref 0.3–1.2)
BUN: 15 mg/dL (ref 6–20)
CALCIUM: 9.5 mg/dL (ref 8.9–10.3)
CO2: 26 mmol/L (ref 22–32)
CREATININE: 1.23 mg/dL (ref 0.61–1.24)
Chloride: 105 mmol/L (ref 101–111)
GFR calc Af Amer: >60 mL/min (ref >60)
GFR calc non Af Amer: >60 mL/min (ref >60)
GLUCOSE: 98 mg/dL (ref 65–99)
Potassium: 4.8 mmol/L (ref 3.5–5.1)
SODIUM: 137 mmol/L (ref 135–145)
Total Protein: 7.4 g/dL (ref 6.5–8.1)

## 2016-05-20 LAB — BRAIN NATRIURETIC PEPTIDE: B Natriuretic Peptide: 793.4 pg/mL — ABNORMAL HIGH (ref 0.0–100.0)

## 2016-05-20 LAB — TSH: TSH: 4.045 u[IU]/mL (ref 0.350–4.500)

## 2016-05-20 MED ORDER — SACUBITRIL-VALSARTAN 24-26 MG PO TABS: 1.0000 | ORAL_TABLET | Freq: Two times a day (BID) | ORAL | 3 refills | Status: DC

## 2016-05-20 NOTE — Patient Instructions (Signed)
Stop Losartan  Start Entresto 24/26 mg Twice daily   Labs today  Labs in 10 days  Your physician recommends that you schedule a follow-up appointment in: 1 month

## 2016-05-20 NOTE — Telephone Encounter (Signed)
-----   Message from Suezanne CheshirePamela S Frederich, RN sent at 05/20/2016  2:07 PM EDT ----- Regarding: Assistance with Entresto Pt called in saying medicaid doesn't cover his entresto prescription.  He was started back on entresto 24-26 mg today.  He previously was on it and still has some leftover for now but when he runs out he said he wouldn't be able to afford it.  Can you call him? Thanks

## 2016-05-20 NOTE — Progress Notes (Signed)
Cardiology: Dr. Jens Somrenshaw HF Cardiology: [name]    Advanced Heart Failure Clinic Note   61 yo with history of smoking, CAD s/p multiple PCIs, chronic systolic CHF/ischemic cardiomyopathy presents for HF clinic followup.  Cardiac cath in 9/17 showed stable anatomy, no intervention.  EF has been markedly reduced for several years now.  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 PICC was placed and low cardiac output confirmed.  He was started on milrinone and diuresed.  He had CRT upgrade of his Medtronic device.    He was admitted again in 12/17 with multiple ICD discharges in the setting of VT storm.  He was started on amiodarone and mexiletine.  He has had PTSD symptoms since that time. He has had no further VT.  In the hospital, LV lead was turned off (?pro-arrhythmic).  Coronary angiography was done, showing no change compared to 9/17.    Admitted 1/23 - 03/31/16 with acute on chronic CHF.  BCx drawn with worsening back pain and PICC line in place. Found to have Coag negative staph 2/2 BCx. Started on IV antibiotics. Found to have ICD infection with ulceration and drainage from site.  ICD removed 03/23/16 and replaced 03/27/16 with dual chamber ICD with His bundle pacing. IV ABX continued until 04/06/16.   He returns for followup today.  Generally doing ok. No ICD discharges.  He quit smoking in 2/18. Weight has gone up since then with increased appetit.  Weight is up 11 lbs.  He can walk up 1 flight of steps comfortably.  He is short of breath walking up 2 flights of steps.  He can walk 1 mile slowly, short of breath with rushing.  No orthopnea/PND. He has bought a treadmill and walks 10-15 minutes twice a day.  Back pain has improved.  No lightheadedness, falls.   Optivol: No evidence for volume overload. No VT.   ECG (personally reviewed): NSR, V-paced   Labs (10/17): hgb 15.8, K 4.3, creatinine  1.11 => 0.98, AST 78 => 43, ALT 105 => 61, digoxin 0.4 Labs (12/17): digoxin < 0.5, mg 1.8, K 4.4, creatinine 1.23 Labs (3/18): K 4.4, creatinine 1.28  PMH: 1. H/o smoking: PFTs (6/17) with minimal obstruction on PFTs. Quit 2/18.  2. Coronary artery disease: Multiple PCIs in the past.  - LHC/RHC (9/17): 85% ostial D2, 99% ostial OM1 (no change from prior) with patent LAD, LCX, OM2, and RCA stents; mean RA 8, PA 58/27 mean 39, mean PCWP 29, CI 2.25, PVR 2.1.  - LHC (12/17): No change compared to 9/17.  3. Chronic systolic CHF: Ischemic cardiomyopathy.   - Echo (10/17): EF 15%, moderate MR, mild to moderate AI, mildly decreased RV systolic function.  - Medtronic CRT-D device => LV lead turned off due to concern for possible pro-arrhythmia.  - CPX (11/17): peak VO2 20.6, VE/VCO2 slope 38, RER 0.97.  Submaximal, probably mild to moderate HF limitation.  - TEE (03/20/16) with LVEF 15% with mod/severe MR.  - RHC (1/18): mean RA 1, PA 36/11, mean PCWP 7, CI 3.06 Fick/3.08 thermo - 1/18 device infection, CRT-D system removed and replaced with a Medtronic ICD with His bundle pacing.  4. 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  5. H/o TIA 6. Ascending aortic aneurysm: 4.1 cm on CTA chest in 8/17.  7. Atrial tachycardia: Noted on device interrogation.  8. Ventricular  tachycardia: Admitted in 12/17 with VT storm and multiple shocks.  LV lead turned off due to concern for pro-arrhythmia.  9. Mitral regurgitation: Moderate to severe on 1/18 TEE, likely functional.   ROS: All systems reviewed and negative except as per HPI.   Social History   Social History  . Marital status: Legally Separated    Spouse name: N/A  . Number of children: N/A  . Years of education: N/A   Occupational History  . Not on file.   Social History Main Topics  . Smoking status: Former Smoker    Packs/day: 1.00    Years: 47.00    Types: Cigarettes    Quit date: 11/20/2015  . Smokeless tobacco: Never Used  .  Alcohol use 3.6 oz/week    6 Cans of beer per week  . Drug use: No  . Sexual activity: Not Currently   Other Topics Concern  . Not on file   Social History Narrative   Married; full time.    Family History  Problem Relation Age of Onset  . Cancer Father   . Cancer Sister     twin sister and only one has cancer, unknown what kind  . Hypertension Brother   . Cancer      family hx  . Heart attack Neg Hx   . Stroke Neg Hx    Current Outpatient Prescriptions  Medication Sig Dispense Refill  . ALPRAZolam (XANAX) 0.25 MG tablet Take 1 tablet (0.25 mg total) by mouth at bedtime as needed for anxiety. F/u with PCP for refills. 30 tablet 0  . amiodarone (PACERONE) 200 MG tablet Take 1 tablet (200 mg total) by mouth daily. 30 tablet 3  . aspirin EC 81 MG tablet Take 81 mg by mouth daily.    [name] atorvastatin (LIPITOR) 40 MG tablet Take 1 tablet (40 mg total) by mouth daily. 30 tablet 5  . bisoprolol (ZEBETA) 5 MG tablet Take 0.5 tablets (2.5 mg total) by mouth at bedtime. 15 tablet 11  . clopidogrel (PLAVIX) 75 MG tablet Take 1 tablet (75 mg total) by mouth daily. 30 tablet 5  . digoxin (LANOXIN) 0.125 MG tablet Take one tablet (0.125 mg) by mouth once every other day 30 tablet 3  . famotidine (PEPCID) 20 MG tablet Take 1 tablet (20 mg total) by mouth 2 (two) times daily. 60 tablet 6  . furosemide (LASIX) 40 MG tablet Take 0.5 tablets (20 mg total) by mouth daily. 30 tablet 11  . hydrOXYzine (VISTARIL) 25 MG capsule Take 25 mg by mouth at bedtime as needed (sleep).    . magnesium oxide (MAG-OX) 400 (241.3 Mg) MG tablet Take 1 tablet (400 mg total) by mouth daily. 30 tablet 6  . mexiletine (MEXITIL) 200 MG capsule Take 1 capsule (200 mg total) by mouth every 12 (twelve) hours. 60 capsule 3  . NITROSTAT 0.4 MG SL tablet PLACE 1 TABLET (0.4 MG TOTAL) UNDER THE TONGUE EVERY 5 (FIVE) MINUTES AS NEEDED FOR CHEST PAIN 25 tablet 6  . PARoxetine (PAXIL) 10 MG tablet Take 1 tablet (10 mg total) by mouth  daily. 30 tablet 3  . spironolactone (ALDACTONE) 25 MG tablet Take 1 tablet (25 mg total) by mouth daily. 30 tablet 6  . sacubitril-valsartan (ENTRESTO) 24-26 MG Take 1 tablet by mouth 2 (two) times daily. 60 tablet 3   No current facility-administered medications for this encounter.    BP (!) 111/59   Pulse 70   Wt 213 lb 1.9 oz (  96.7 kg)   SpO2 100%   BMI 29.72 kg/m    Wt Readings from Last 3 Encounters:  05/20/16 213 lb 1.9 oz (96.7 kg)  04/23/16 204 lb (92.5 kg)  04/13/16 202 lb 8 oz (91.9 kg)    General: NAD Neck: JVP 7 cm. No thyromegaly or thyroid nodule.  Lungs: CTAB  CV: Nondisplaced PMI.  Heart regular S1/S2, no S3/S4, 2/6 HSM apex.  No peripheral edema.  No carotid bruit.  Normal pedal pulses.  Abdomen: Soft, NT, ND, no HSM. No bruits or masses. +BS .  Skin: Intact without lesions or rashes.  Neurologic: Alert and oriented x 3.  Psych: Normal affect. Extremities: No clubbing or cyanosis.  HEENT: Normal.   Assessment/Plan: 1. Chronic systolic CHF: With low cardiac output noted on during admission in 10/17.  Ischemic cardiomyopathy, had upgrade to Medtronic BiV ICD in 10/17.  However, with VT storm in 12/17, the LV lead was turned off due to concerns for pro-arrhythmia.  In 1/18, he developed a device infection and the BiV-ICD was removed and later replaced with a Medtronic ICD with His bundle pacing.  RHC (1/18) with preserved cardiac output.  TEE (1/18) with EF 15%.  Volume status stable on exam. NYHA class II-III symptoms.     - Continue Lasix 40 mg daily.   - BP stable.  Stop losartan, start Entresto 24/26 bid.  BMET today and again in 10 days.   - Continue spironolactone 25 mg daily.   - Continue digoxin 0.125 mg every other day, check level today.  - Continue bisoprolol 2.5 qhs.  - May eventually need to consider for LVAD. Has 3 children and lives with sister.  Will follow his clinical trajectory closely.  - Will refer to cardiac rehab once able to drive.       2.   CAD: No chest pain. Recent LHC in 12/17 with stable coronary disease.  - Continue ASA 81, Plavix, and statin.  3. Smoking: Remains abstinent. I think his weight gain is likely increased appetite since he quit smoking as he does not look volume overloaded on exam.  4. 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  Suspect this contributed to HF with loss of AV synchrony (and also was bradycardic).  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  He was BiV paced after 10/17 admission, but LV lead was turned off due to concern for pro-arrhythmia.  He now has a Medtronic ICD with His bundle pacing.  He is pacing today on ECG.   5. Ascending aortic aneurysm: 4.1 cm in 8/17. Needs repeat CT in 8/18. No change to current plan.  6. Ventricular tachycardia: VT storm in 12/17.   - Continue amiodarone and mexiletine. No VT/VF by device interrogation today.  Check LFTs and TSH today with amiodarone use. He will need to get a regular eye exam.  - No driving until 6 months since last VT event. (6/18).  7. PTSD: Patient is on paroxetine due to significant anxiety around shocks.  He has a hard time sleeping.  - Continue prn xanax.  Followup in 1 month.  Marca Ancona, MD  05/20/2016

## 2016-05-20 NOTE — Telephone Encounter (Signed)
Entresto PA approved by [place] [employer] through 05/20/17.   Tyler DeisErika K. Bonnye FavaNicolsen, PharmD, BCPS, CPP Clinical Pharmacist Pager: [phone] Phone: [phone] 05/20/2016 3:43 PM

## 2016-05-22 ENCOUNTER — Telehealth (HOSPITAL_COMMUNITY): Payer: Self-pay | Admitting: *Deleted

## 2016-05-22 MED ORDER — LOSARTAN POTASSIUM 25 MG PO TABS: 25.0000 mg | ORAL_TABLET | Freq: Every day | ORAL | 3 refills | Status: DC

## 2016-05-22 NOTE — Telephone Encounter (Signed)
Patient called to report that since starting Entresto 24-26 mg this past Wednesay his blood pressure has been low. Yesterday it was 87/56 and today it was 89/53.  I advised patient to skip his evening dose tonight and see if his blood pressure is higher tomorrow.    I will route message to Dr. Shirlee Latch.

## 2016-05-22 NOTE — Telephone Encounter (Signed)
Called patient back and he is agreeable with restarting Losartan 25 mg Daily. Medication list has been updated.

## 2016-05-22 NOTE — Telephone Encounter (Signed)
He probably is not going to be able to take Entresto due to low BP.  Would stop Entresto and restart losartan 25 mg daily.

## 2016-05-25 NOTE — Progress Notes (Signed)
Electrophysiology Office Note Date: 05/27/2016  ID:  [name], DOB January 16, 1956, MRN [id number]  PCP: Tula Nakayama, MD Primary Cardiologist: Shirlee Latch Electrophysiologist: Graciela Husbands  CC: 6 week heart failure/His pacing follow up  [name] is a 61 y.o. male seen today for Dr Graciela Husbands.  He presents today for routine electrophysiology followup.  Since discharge, the patient reports doing very well.  He is significantly improved post CRT reimplant with His Bundle pacing lead. He has stable shortness of breath. He is walking for 10 minutes 2-3 times a day and using light weights. He denies chest pain, palpitations, PND, orthopnea, nausea, vomiting, dizziness, syncope, edema, weight gain, or early satiety.  He has not had ICD shocks.   Device History: MDT single chamber ICD implanted 2004 for ICM; gen change 2010; upgrade to CRTD 2017, device extracted 03/2016; implant of MDT His CRTD 2018 History of appropriate therapy: Yes History of AAD therapy: Yes - amiodarone and mexiletine    Past Medical History:  Diagnosis Date  . Anxiety   . Automatic implantable cardioverter-defibrillator in situ    a. Medtronic ICD.  [name] CAD (coronary artery disease)    a.  Severe LAD stenosis 2/2 acute thrombus - BMS 2009;  b. 06/01/11 Cath - LAD 20 isr, 65m (3.0x16 Veri-flex BMS), OM1 100p, EF 20-25%;  c. 07/2012 Abnl Cardiolite;  d. 08/2012 Cath/PCI: LM nl, LAD patent stents, D2 80-90 (jailed), LCX 90 p/m (4.0x24 Promus Premier DES), OM1 100, OM2 80-90p (3.0x20 Promus Premier DES), RCA patent mid stent, 40d, EF 25%.  e. 10/29/15 LHC stable disease with patent stents  . Chronic systolic CHF (congestive heart failure), NYHA class 3 (HCC) - low output state 12/12/2014  . Depression   . Dyspnea   . History of rheumatic fever 1962  . HLD (hyperlipidemia) mixed  . HTN (hypertension)   . Ischemic cardiomyopathy    a.  EF 30-35% 2010;  b.  EF 20-25% by LV gram 08/2012. c. EF 15% by echo 11/2014.  [name] Myocardial  infarction 1998; 2002; ~ 2010  . Paroxysmal ventricular tachycardia (HCC)    a. VFlutter  CL 210 msec  Rx shock 04/2011  . PVC's (premature ventricular contractions)   . RBBB   . TIA (transient ischemic attack)    a. Dx 11/2014, continued on ASA/Plavix.   Past Surgical History:  Procedure Laterality Date  . CARDIAC CATHETERIZATION N/A 10/29/2015   Procedure: Right/Left Heart Cath and Coronary Angiography;  Surgeon: Peter M Swaziland, MD;  Location: [facility] INVASIVE CV LAB;  Service: Cardiovascular;  Laterality: N/A;  . CARDIAC CATHETERIZATION N/A 03/20/2016   Procedure: Right Heart Cath;  Surgeon: Laurey Morale, MD;  Location: [facility] INVASIVE CV LAB;  Service: Cardiovascular;  Laterality: N/A;  . CARDIAC DEFIBRILLATOR PLACEMENT     MDT single chamber primary prevention ICD implanted by Dr Graciela Husbands as part of MASTER study  . EP IMPLANTABLE DEVICE N/A 12/11/2015   Procedure: BiV Upgrade;  Surgeon: [name], MD;  Location: [facility] INVASIVE CV LAB;  Service: Cardiovascular;  Laterality: N/A;  . EP IMPLANTABLE DEVICE N/A 12/11/2015   Procedure: Pocket Revision/Relocation;  Surgeon: [name], MD;  Location: [facility] INVASIVE CV LAB;  Service: Cardiovascular;  Laterality: N/A;  . EP IMPLANTABLE DEVICE N/A 12/11/2015   Procedure: Lead Revision/Repair;  Surgeon: [name], MD;  Location: [facility] INVASIVE CV LAB;  Service: Cardiovascular;  Laterality: N/A;  . EP IMPLANTABLE DEVICE N/A 03/23/2016   Procedure: Temporary placement of pacemaker lead  and generator;  Surgeon: Marinus Maw, MD;  Location: [facility] OR;  Service: Cardiovascular;  Laterality: N/A;  . EP IMPLANTABLE DEVICE N/A 03/27/2016   Procedure: ICD Implant;  Surgeon: [name], MD;  Location: [facility] INVASIVE CV LAB;  Service: Cardiovascular;  Laterality: N/A;  . GENERATOR REMOVAL N/A 03/23/2016   Procedure: DEVICE REMOVAL;  Surgeon: Marinus Maw, MD;  Location: [facility] OR;  Service: Cardiovascular;  Laterality: N/A;  PVT to back up  . HERNIA REPAIR      "w/gallbladder OR" (08/29/2012)  . IR GENERIC HISTORICAL  03/31/2016   IR US GUIDE VASC ACCESS LEFT 03/31/2016 Simonne Come, MD [facility]-INTERV RAD  . IR GENERIC HISTORICAL  03/31/2016   IR FLUORO GUIDE CV MIDLINE PICC LEFT 03/31/2016 Simonne Come, MD [facility]INTER[facility]  . LAPAROSCOPIC CHOLECYSTECTOMY    . LEFT HEART CATHETERIZATION WITH CORONARY ANGIOGRAM N/A 06/08/2011   Procedure: LEFT HEART CATHETERIZATION WITH CORONARY ANGIOGRAM;  Surgeon: Dolores Patty, MD;  Location: [facility] CATH LAB;  Service: Cardiovascular;  Laterality: N/A;  . PERCUTANEOUS CORONARY STENT INTERVENTION (PCI-S) N/A 06/01/2011   Procedure: PERCUTANEOUS CORONARY STENT INTERVENTION (PCI-S);  Surgeon: Peter M Swaziland, MD;  Location: [facility] CATH LAB;  Service: Cardiovascular;  Laterality: N/A;  . PERCUTANEOUS CORONARY STENT INTERVENTION (PCI-S) N/A 08/29/2012   Procedure: PERCUTANEOUS CORONARY STENT INTERVENTION (PCI-S);  Surgeon: Peter M Swaziland, MD;  Location: [facility] CATH LAB;  Service: Cardiovascular;  Laterality: N/A;  . PERIPHERAL VASCULAR CATHETERIZATION  12/11/2015   Procedure: Upper Extremity Venography;  Surgeon: [name], MD;  Location: [facility] INVASIVE CV LAB;  Service: Cardiovascular;;  . TEE WITHOUT CARDIOVERSION N/A 03/20/2016   Procedure: TRANSESOPHAGEAL ECHOCARDIOGRAM (TEE);  Surgeon: Vesta Mixer, MD;  Location: [facility] ENDOSCOPY;  Service: Cardiovascular;  Laterality: N/A;  . TEE WITHOUT CARDIOVERSION N/A 03/23/2016   Procedure: TRANSESOPHAGEAL ECHOCARDIOGRAM (TEE);  Surgeon: Marinus Maw, MD;  Location: [facility] OR;  Service: Cardiovascular;  Laterality: N/A;    Current Outpatient Prescriptions  Medication Sig Dispense Refill  . ALPRAZolam (XANAX) 0.25 MG tablet Take 1 tablet (0.25 mg total) by mouth at bedtime as needed for anxiety. F/u with PCP for refills. 30 tablet 0  . amiodarone (PACERONE) 200 MG tablet Take 1 tablet (200 mg total) by mouth daily. 30 tablet 3  . aspirin EC 81 MG tablet Take 81 mg by mouth daily.    [name] atorvastatin (LIPITOR) 40  MG tablet Take 1 tablet (40 mg total) by mouth daily. 30 tablet 5  . bisoprolol (ZEBETA) 5 MG tablet Take 0.5 tablets (2.5 mg total) by mouth at bedtime. 15 tablet 11  . clopidogrel (PLAVIX) 75 MG tablet Take 1 tablet (75 mg total) by mouth daily. 30 tablet 5  . digoxin (LANOXIN) 0.125 MG tablet Take one tablet (0.125 mg) by mouth once every other day 30 tablet 3  . famotidine (PEPCID) 20 MG tablet Take 1 tablet (20 mg total) by mouth 2 (two) times daily. 60 tablet 6  . furosemide (LASIX) 40 MG tablet Take 0.5 tablets (20 mg total) by mouth daily. 30 tablet 11  . hydrOXYzine (VISTARIL) 25 MG capsule Take 25 mg by mouth at bedtime as needed (sleep).    . losartan (COZAAR) 25 MG tablet Take 1 tablet (25 mg total) by mouth daily. 90 tablet 3  . magnesium oxide (MAG-OX) 400 (241.3 Mg) MG tablet Take 1 tablet (400 mg total) by mouth daily. 30 tablet 6  . mexiletine (MEXITIL) 200 MG capsule Take 1 capsule (200 mg total) by  mouth every 12 (twelve) hours. 60 capsule 3  . NITROSTAT 0.4 MG SL tablet PLACE 1 TABLET (0.4 MG TOTAL) UNDER THE TONGUE EVERY 5 (FIVE) MINUTES AS NEEDED FOR CHEST PAIN 25 tablet 6  . PARoxetine (PAXIL) 10 MG tablet Take 1 tablet (10 mg total) by mouth daily. 30 tablet 3  . spironolactone (ALDACTONE) 25 MG tablet Take 1 tablet (25 mg total) by mouth daily. 30 tablet 6   No current facility-administered medications for this visit.     Allergies:   Patient has no known allergies.   Social History: Social History   Social History  . Marital status: Legally Separated    Spouse name: N/A  . Number of children: N/A  . Years of education: N/A   Occupational History  . Not on file.   Social History Main Topics  . Smoking status: Former Smoker    Packs/day: 1.00    Years: 47.00    Types: Cigarettes    Quit date: 11/20/2015  . Smokeless tobacco: Never Used  . Alcohol use 3.6 oz/week    6 Cans of beer per week  . Drug use: No  . Sexual activity: Not Currently   Other  Topics Concern  . Not on file   Social History Narrative   Married; full time.     Family History: Family History  Problem Relation Age of Onset  . Cancer Father   . Cancer Sister     twin sister and only one has cancer, unknown what kind  . Hypertension Brother   . Cancer      family hx  . Heart attack Neg Hx   . Stroke Neg Hx     Review of Systems: All other systems reviewed and are otherwise negative except as noted above.   Physical Exam: VS:  Pulse 70   Ht  (1.803 m)   Wt 211 lb 8 oz (95.9 kg)   SpO2 98%   BMI 29.50 kg/m  , BMI Body mass index is 29.5 kg/m.  GEN- The patient is well appearing, alert and oriented x 3 today.   HEENT: normocephalic, atraumatic; sclera clear, conjunctiva pink; hearing intact; oropharynx clear; neck supple  Lungs- Clear to ausculation bilaterally, normal work of breathing.  No wheezes, rales, rhonchi Heart- Regular rate and rhythm, 2/6 SEM GI- soft, non-tender, non-distended, bowel sounds present  Extremities- no clubbing, cyanosis, or edema  MS- no significant deformity or atrophy Skin- warm and dry, no rash or lesion; ICD pocket well healed Psych- euthymic mood, full affect Neuro- strength and sensation are intact  ICD interrogation- reviewed in detail today,  See PACEART report  EKG:  EKG is ordered today. The ekg ordered today shows sinus rhythm with V pacing   Recent Labs: 03/26/2016: Magnesium 2.0 04/13/2016: Hemoglobin 12.3; Platelets 310 05/20/2016: ALT 22; B Natriuretic Peptide 793.4; BUN 15; Creatinine, Ser 1.23; Potassium 4.8; Sodium 137; TSH 4.045   Wt Readings from Last 3 Encounters:  05/27/16 211 lb 8 oz (95.9 kg)  05/20/16 213 lb 1.9 oz (96.7 kg)  04/23/16 204 lb (92.5 kg)     Other studies Reviewed: Additional studies/ records that were reviewed today include: Dr Shirlee Latch and Dr Odessa Fleming notes   Assessment and Plan:  1.  Chronic systolic dysfunction euvolemic today Stable on an appropriate medical  regimen Normal ICD function See Pace Art report No changes today His Bundle threshold non-selective/septal from 4.5V down to loss of capture at 1.25V today  Enroll in [facility]  clinic today  Update echo 09/2016 (6 months post His pacing) Rate response turned on today   2. Ventricular tachycardia No recent recurrence Keep K>3.9, Mg >1.8 Continue amiodarone and mexiletine. Recent TSH, LFT's stable  3.  CAD No recent ischemic symptoms Continue medical therapy  4.  PTSD 2/2 multiple ICD shocks We discussed today He is doing ok taking Xanax as needed    Current medicines are reviewed at length with the patient today.   The patient does not have concerns regarding his medicines.  The following changes were made today:  none  Labs/ tests ordered today include: echo 09/2016 No orders of the defined types were placed in this encounter.    Disposition:   Follow up with ICM clinic, [facility], Dr Shirlee Latch as scheduled, Dr Graciela Husbands as scheduled    Signed, Gypsy Balsam, NP 05/27/2016 9:54 AM  [facility] [address] [place] [address] [phone] (office) [phone] (fax)

## 2016-05-27 ENCOUNTER — Encounter: Payer: Self-pay | Admitting: Nurse Practitioner

## 2016-05-27 ENCOUNTER — Ambulatory Visit (INDEPENDENT_AMBULATORY_CARE_PROVIDER_SITE_OTHER): Payer: Self-pay | Admitting: Nurse Practitioner

## 2016-05-27 VITALS — HR 70 | Ht 71.0 in | Wt 211.5 lb

## 2016-05-27 DIAGNOSIS — I5022 Chronic systolic (congestive) heart failure: Secondary | ICD-10-CM

## 2016-05-27 DIAGNOSIS — I2510 Atherosclerotic heart disease of native coronary artery without angina pectoris: Secondary | ICD-10-CM

## 2016-05-27 DIAGNOSIS — I472 Ventricular tachycardia, unspecified: Secondary | ICD-10-CM

## 2016-05-27 DIAGNOSIS — F4310 Post-traumatic stress disorder, unspecified: Secondary | ICD-10-CM

## 2016-05-27 LAB — BASIC METABOLIC PANEL
BUN / CREAT RATIO: 17 (ref 10–24)
BUN: 24 mg/dL (ref 8–27)
CHLORIDE: 101 mmol/L (ref 96–106)
CO2: 22 mmol/L (ref 18–29)
CREATININE: 1.41 mg/dL — AB (ref 0.76–1.27)
Calcium: 9.5 mg/dL (ref 8.6–10.2)
GFR calc non Af Amer: 53 mL/min/{1.73_m2} — ABNORMAL LOW (ref >59)
GFR, EST AFRICAN AMERICAN: 61 mL/min/{1.73_m2} (ref >59)
Glucose: 89 mg/dL (ref 65–99)
Potassium: 4.9 mmol/L (ref 3.5–5.2)
SODIUM: 138 mmol/L (ref 134–144)

## 2016-05-27 NOTE — Patient Instructions (Addendum)
Medication Instructions:   Your physician recommends that you continue on your current medications as directed. Please refer to the Current Medication list given to you today.   If you need a refill on your cardiac medications before your next appointment, please call your pharmacy.  Labwork: BMET TODAY    Testing/Procedures: Your physician has requested that you have an echocardiogram. Echocardiography is a painless test that uses sound waves to create images of your heart. It provides your doctor with information about the size and shape of your heart and how well your heart's chambers and valves are working. This procedure takes approximately one hour. There are no restrictions for this procedure.     Follow-Up:  KEEP APPOINTMENTS AS SCHEDULED   Any Other Special Instructions Will Be Listed Below (If Applicable).

## 2016-05-29 ENCOUNTER — Telehealth: Payer: Self-pay

## 2016-05-29 NOTE — Telephone Encounter (Signed)
Referred to ICM clinic by Gypsy Balsam, NP.  Attempted ICM intro call to patient and no answer.  1st ICM remote transmission scheduled for 06/29/2016.

## 2016-06-01 ENCOUNTER — Other Ambulatory Visit (HOSPITAL_COMMUNITY): Payer: Self-pay | Admitting: *Deleted

## 2016-06-01 ENCOUNTER — Inpatient Hospital Stay (HOSPITAL_COMMUNITY): Admission: RE | Admit: 2016-06-01 | Payer: Self-pay | Source: Ambulatory Visit

## 2016-06-01 DIAGNOSIS — I5022 Chronic systolic (congestive) heart failure: Secondary | ICD-10-CM

## 2016-06-09 ENCOUNTER — Telehealth: Payer: Self-pay | Admitting: Cardiology

## 2016-06-09 ENCOUNTER — Ambulatory Visit: Payer: Medicaid Other | Admitting: *Deleted

## 2016-06-09 NOTE — Telephone Encounter (Signed)
Pt called and wanted to know if we received a remote transmission. He stated that he felt dizzy earlier and he related the feeling to when his device shocked him 45 times. I informed him that his transmission was not received. Verified serial number in [facility]. Pt had two carelink profiles. Called [facility] tech support to get help fixing pt profile so his monitor would send a transmission after several attempts tech support representative stated that she would put in high priority ticket for pt and have someone call back w/ details. Pt may need a new monitor. Called pt back and informed him of this. Pt agreed to come into the office today at 4:15 PM. He stated that his blood pressure when he felt dizzy was 123/75.

## 2016-06-16 ENCOUNTER — Encounter: Payer: Self-pay | Admitting: Internal Medicine

## 2016-06-19 ENCOUNTER — Other Ambulatory Visit: Payer: Self-pay | Admitting: Physician Assistant

## 2016-06-22 ENCOUNTER — Encounter (HOSPITAL_COMMUNITY): Payer: Self-pay

## 2016-06-22 ENCOUNTER — Telehealth (HOSPITAL_COMMUNITY): Payer: Self-pay | Admitting: Pharmacist

## 2016-06-22 ENCOUNTER — Ambulatory Visit (HOSPITAL_COMMUNITY)
Admission: RE | Admit: 2016-06-22 | Discharge: 2016-06-22 | Disposition: A | Discharge status: Home / Self Care | Payer: Medicaid Other | Source: Ambulatory Visit | Attending: Cardiology | Admitting: Cardiology

## 2016-06-22 VITALS — BP 110/62 | HR 87 | Wt 218.8 lb

## 2016-06-22 DIAGNOSIS — I712 Thoracic aortic aneurysm, without rupture: Secondary | ICD-10-CM | POA: Insufficient documentation

## 2016-06-22 DIAGNOSIS — I340 Nonrheumatic mitral (valve) insufficiency: Secondary | ICD-10-CM | POA: Diagnosis not present

## 2016-06-22 DIAGNOSIS — I2510 Atherosclerotic heart disease of native coronary artery without angina pectoris: Secondary | ICD-10-CM | POA: Diagnosis not present

## 2016-06-22 DIAGNOSIS — Z87891 Personal history of nicotine dependence: Secondary | ICD-10-CM | POA: Insufficient documentation

## 2016-06-22 DIAGNOSIS — Z7902 Long term (current) use of antithrombotics/antiplatelets: Secondary | ICD-10-CM | POA: Diagnosis not present

## 2016-06-22 DIAGNOSIS — F17200 Nicotine dependence, unspecified, uncomplicated: Secondary | ICD-10-CM

## 2016-06-22 DIAGNOSIS — Z79899 Other long term (current) drug therapy: Secondary | ICD-10-CM | POA: Insufficient documentation

## 2016-06-22 DIAGNOSIS — I472 Ventricular tachycardia, unspecified: Secondary | ICD-10-CM

## 2016-06-22 DIAGNOSIS — Z95810 Presence of automatic (implantable) cardiac defibrillator: Secondary | ICD-10-CM | POA: Diagnosis not present

## 2016-06-22 DIAGNOSIS — F4310 Post-traumatic stress disorder, unspecified: Secondary | ICD-10-CM | POA: Insufficient documentation

## 2016-06-22 DIAGNOSIS — I5022 Chronic systolic (congestive) heart failure: Secondary | ICD-10-CM | POA: Diagnosis not present

## 2016-06-22 DIAGNOSIS — Z7982 Long term (current) use of aspirin: Secondary | ICD-10-CM | POA: Insufficient documentation

## 2016-06-22 DIAGNOSIS — Z8673 Personal history of transient ischemic attack (TIA), and cerebral infarction without residual deficits: Secondary | ICD-10-CM | POA: Diagnosis not present

## 2016-06-22 DIAGNOSIS — I255 Ischemic cardiomyopathy: Secondary | ICD-10-CM | POA: Diagnosis not present

## 2016-06-22 LAB — CBC
HCT: 38.9 % — ABNORMAL LOW (ref 39.0–52.0)
HEMOGLOBIN: 12.6 g/dL — AB (ref 13.0–17.0)
MCH: 30.7 pg (ref 26.0–34.0)
MCHC: 32.4 g/dL (ref 30.0–36.0)
MCV: 94.9 fL (ref 78.0–100.0)
Platelets: 221 10*3/uL (ref 150–400)
RBC: 4.1 MIL/uL — AB (ref 4.22–5.81)
RDW: 14.6 % (ref 11.5–15.5)
WBC: 6.7 10*3/uL (ref 4.0–10.5)

## 2016-06-22 LAB — BRAIN NATRIURETIC PEPTIDE: B Natriuretic Peptide: 509.8 pg/mL — ABNORMAL HIGH (ref 0.0–100.0)

## 2016-06-22 LAB — BASIC METABOLIC PANEL
ANION GAP: 5 (ref 5–15)
BUN: 21 mg/dL — ABNORMAL HIGH (ref 6–20)
CHLORIDE: 108 mmol/L (ref 101–111)
CO2: 24 mmol/L (ref 22–32)
Calcium: 9.3 mg/dL (ref 8.9–10.3)
Creatinine, Ser: 1.13 mg/dL (ref 0.61–1.24)
GFR calc non Af Amer: >60 mL/min (ref >60)
GLUCOSE: 101 mg/dL — AB (ref 65–99)
Potassium: 4.4 mmol/L (ref 3.5–5.1)
Sodium: 137 mmol/L (ref 135–145)

## 2016-06-22 NOTE — Patient Instructions (Signed)
Routine lab work today. Will notify you of abnormal results, otherwise no news is good news!  Will schedule you for Cardiopulmonary Exercise Test. This test is done at our Heart Failure Clinic. Please wear comfortable clothes and shoes for this test. Avoid heavy meal before the test (light snack/meal recommended). Avoid caffeine, alcohol, tobacco products 12 hrs before test. Please give 24 hr notice for cancellations/rescheduling: [phone].  Follow up with Dr. Shirlee Latch in 6-8 weeks.  Do the following things EVERYDAY: 1) Weigh yourself in the morning before breakfast. Write it down and keep it in a log. 2) Take your medicines as prescribed 3) Eat low salt foods-Limit salt (sodium) to 2000 mg per day.  4) Stay as active as you can everyday 5) Limit all fluids for the day to less than 2 liters

## 2016-06-22 NOTE — Telephone Encounter (Signed)
[name] stated that his mexiletine is over $100 every month and he cannot afford this. He has [name] [employer] and it is on their preferred formulary list but when [facility] called Medicaid they stated that he has a limited Medicaid coverage which does not cover the mexiletine. He was told to call his case worker and discuss this with them since he thought he had full Medicaid coverage.   [name]. Bonnye Fava, PharmD, BCPS, CPP Clinical Pharmacist Pager: [phone] Phone: [phone] 06/22/2016 3:38 PM

## 2016-06-22 NOTE — Progress Notes (Signed)
Cardiology: Dr. Jens Som HF Cardiology: Dr. Shirlee Latch    Advanced Heart Failure Clinic Note   [name] is a 61 y.o. male with history of smoking, CAD s/p multiple PCIs, chronic systolic CHF/ischemic cardiomyopathy presents for HF clinic followup.  Cardiac cath in 9/17 showed stable anatomy, no intervention.  EF has been markedly reduced for several years now.  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 PICC was placed and low cardiac output confirmed.  He was started on milrinone and diuresed.  He had CRT upgrade of his Medtronic device.    He was admitted again in 12/17 with multiple ICD discharges in the setting of VT storm.  He was started on amiodarone and mexiletine.  He has had PTSD symptoms since that time. He has had no further VT.  In the hospital, LV lead was turned off (?pro-arrhythmic).  Coronary angiography was done, showing no change compared to 9/17.    Admitted 1/23 - 03/31/16 with acute on chronic CHF.  BCx drawn with worsening back pain and PICC line in place. Found to have Coag negative staph 2/2 BCx. Started on IV antibiotics. Found to have ICD infection with ulceration and drainage from site.  ICD removed 03/23/16 and replaced 03/27/16 with dual chamber ICD with His bundle pacing. IV ABX continued until 04/06/16.   He presents today for regular follow up. At last visit transitioned to [facility], but pt did not tolerate with hypotension. Now back on torsemide. Doing well overall. No ICD shocks.   He quit smoking in 2/18. He remains abstinent. States he has gained ~ 20 lbs since he's been out of the hospital in January. Weight up another 5 lbs since visit in March. He thinks it is weight gain. SOB has continued to improve. Using TM up to 2.5 mph for 15 minutes with no SOB. Denies lightheadedness or dizziness. Back pain is stable. Has been back to chiropractor. Occasional mild orthopnea. No  bendopnea. Taking lasix 20 mg daily and taking an extra 20 1-2 times a month.   Optivol: Fluid index below threshold with no recent crossings. Thoracic impedence below threshold with overall up trend of baseline over the past month. Pt activity 3-4 hrs daily.   ECG (personally reviewed): NSR, V-paced   Labs (10/17): hgb 15.8, K 4.3, creatinine 1.11 => 0.98, AST 78 => 43, ALT 105 => 61, digoxin 0.4 Labs (12/17): digoxin < 0.5, mg 1.8, K 4.4, creatinine 1.23 Labs (3/18): K 4.4, creatinine 1.28  PMH: 1. H/o smoking: PFTs (6/17) with minimal obstruction on PFTs. Quit 2/18.  2. Coronary artery disease: Multiple PCIs in the past.  - LHC/RHC (9/17): 85% ostial D2, 99% ostial OM1 (no change from prior) with patent LAD, LCX, OM2, and RCA stents; mean RA 8, PA 58/27 mean 39, mean PCWP 29, CI 2.25, PVR 2.1.  - LHC (12/17): No change compared to 9/17.  3. Chronic systolic CHF: Ischemic cardiomyopathy.   - Echo (10/17): EF 15%, moderate MR, mild to moderate AI, mildly decreased RV systolic function.  - Medtronic CRT-D device => LV lead turned off due to concern for possible pro-arrhythmia.  - CPX (11/17): peak VO2 20.6, VE/VCO2 slope 38, RER 0.97.  Submaximal, probably mild to moderate HF limitation.  - TEE (03/20/16) with LVEF 15% with mod/severe MR.  - RHC (1/18): mean RA 1, PA 36/11, mean PCWP 7, CI 3.06 Fick/3.08  thermo - 1/18 device infection, CRT-D system removed and replaced with a Medtronic ICD with His bundle pacing.  4. 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  5. H/o TIA 6. Ascending aortic aneurysm: 4.1 cm on CTA chest in 8/17.  7. Atrial tachycardia: Noted on device interrogation.  8. 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  LV lead turned off due to concern for pro-arrhythmia.  9. Mitral regurgitation: Moderate to severe on 1/18 TEE, likely functional.   Review of systems complete and found to be negative unless listed in HPI.    Social  History   Social History  . Marital status: Legally Separated    Spouse name: N/A  . Number of children: N/A  . Years of education: N/A   Occupational History  . Not on file.   Social History Main Topics  . Smoking status: Former Smoker    Packs/day: 1.00    Years: 47.00    Types: Cigarettes    Quit date: 11/20/2015  . Smokeless tobacco: Never Used  . Alcohol use 3.6 oz/week    6 Cans of beer per week  . Drug use: No  . Sexual activity: Not Currently   Other Topics Concern  . Not on file   Social History Narrative   Married; full time.    Family History  Problem Relation Age of Onset  . Cancer Father   . Cancer Sister     twin sister and only one has cancer, unknown what kind  . Hypertension Brother   . Cancer      family hx  . Heart attack Neg Hx   . Stroke Neg Hx    Current Outpatient Prescriptions  Medication Sig Dispense Refill  . ALPRAZolam (XANAX) 0.25 MG tablet Take 1 tablet (0.25 mg total) by mouth at bedtime as needed for anxiety. F/u with PCP for refills. 30 tablet 0  . amiodarone (PACERONE) 200 MG tablet Take 1 tablet (200 mg total) by mouth daily. 30 tablet 3  . aspirin EC 81 MG tablet Take 81 mg by mouth daily.    [name] atorvastatin (LIPITOR) 40 MG tablet Take 1 tablet (40 mg total) by mouth daily. 30 tablet 5  . bisoprolol (ZEBETA) 5 MG tablet Take 0.5 tablets (2.5 mg total) by mouth at bedtime. 15 tablet 11  . clopidogrel (PLAVIX) 75 MG tablet Take 1 tablet (75 mg total) by mouth daily. 30 tablet 5  . digoxin (LANOXIN) 0.125 MG tablet Take one tablet (0.125 mg) by mouth once every other day 30 tablet 3  . famotidine (PEPCID) 20 MG tablet Take 1 tablet (20 mg total) by mouth 2 (two) times daily. 60 tablet 6  . furosemide (LASIX) 40 MG tablet Take 0.5 tablets (20 mg total) by mouth daily. 30 tablet 11  . hydrOXYzine (VISTARIL) 25 MG capsule Take 25 mg by mouth at bedtime as needed (sleep).    . losartan (COZAAR) 25 MG tablet Take 1 tablet (25 mg total) by  mouth daily. 90 tablet 3  . magnesium oxide (MAG-OX) 400 (241.3 Mg) MG tablet Take 1 tablet (400 mg total) by mouth daily. 30 tablet 6  . mexiletine (MEXITIL) 200 MG capsule TAKE ONE CAPSULE BY MOUTH EVERY 12 HOURS 60 capsule 3  . NITROSTAT 0.4 MG SL tablet PLACE 1 TABLET (0.4 MG TOTAL) UNDER THE TONGUE EVERY 5 (FIVE) MINUTES AS NEEDED FOR CHEST PAIN 25 tablet 6  . PARoxetine (PAXIL) 10 MG tablet Take  1 tablet (10 mg total) by mouth daily. 30 tablet 3  . spironolactone (ALDACTONE) 25 MG tablet Take 1 tablet (25 mg total) by mouth daily. 30 tablet 6  . triamcinolone cream (KENALOG) 0.1 % USE 1 APPLICATION TOPICALLY TO THE AFFECTED AREA TWICE A DAY AS NEEDED FOR ECZEMA  5   No current facility-administered medications for this encounter.    BP 110/62 (BP Location: Left Arm, Patient Position: Sitting, Cuff Size: Normal)   Pulse 87   Wt 218 lb 12.8 oz (99.2 kg)   SpO2 100%   BMI 30.52 kg/m    Wt Readings from Last 3 Encounters:  06/22/16 218 lb 12.8 oz (99.2 kg)  05/27/16 211 lb 8 oz (95.9 kg)  05/20/16 213 lb 1.9 oz (96.7 kg)    General: Well appearing. No resp difficulty. HEENT: normal Neck: supple. JVD 5-6. Carotids 2+ bilat; no bruits. No thyromegaly or nodule noted. Cor: PMI nondisplaced. RRR, 2/6 HSM Apex.  Lungs: CTAB, normal effort. Abdomen: soft, non-tender, distended, no HSM. No bruits or masses. +BS  Extremities: no cyanosis, clubbing, rash, R and LLE no edema.  Neuro: alert & orientedx3, cranial nerves grossly intact. moves all 4 extremities w/o difficulty. Affect pleasant    Assessment/Plan: 1. Chronic systolic CHF: With low cardiac output noted on during admission in 10/17.  Ischemic cardiomyopathy, had upgrade to Medtronic BiV ICD in 10/17.  However, with VT storm in 12/17, the LV lead was turned off due to concerns for pro-arrhythmia.  In 1/18, he developed a device infection and the BiV-ICD was removed and later replaced with a Medtronic ICD with His bundle pacing.   RHC (1/18) with preserved cardiac output.  TEE (1/18) with EF 15%.   - Volume status stable on exam with NYHA class II symptoms.  - Continue lasix 20 mg daily with extra as needed.  - Intolerant to entresto with hypotension. Continue losartan 25 mg daily.    - Continue spironolactone 25 mg daily.   - Continue digoxin 0.125 mg every other day. Level stable last check. Consider repeat next visit.  - Continue bisoprolol 2.5 qhs.  - Will refer to cardiac rehab once able to drive.    (07/2016) - Will repeat CPX and follow trajector for LVAD consideration. Has 3 children and lives with sister. 2.  CAD: No chest pain. Recent LHC in 12/17 with stable coronary disease.  - Continue ASA 81, Plavix, and statin. Stable. No change.  3. Smoking: Remains abstinent. - Pt does not look volume overloaded on exam. Think weight gain is likely increased appetite with smoking cessation. Recommended [employer].  4. 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  Suspect this contributed to HF with loss of AV synchrony (and also was bradycardic).  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  He was BiV paced after 10/17 admission, but LV lead was turned off due to concern for pro-arrhythmia.  He now has a Medtronic ICD with His bundle pacing.   - Stable. No recent arrythmias.   5. Ascending aortic aneurysm: 4.1 cm in 8/17. Needs repeat CT in 8/18.No change.  6. Ventricular tachycardia: VT storm in 12/17.   - Continue amiodarone and mexiletine. - No VT/VF by corevue today.  - LFTs and TSH stable. Needs regular eye exams.  - No driving until 6 months since last VT event. (6/18).  7. PTSD: Patient is on paroxetine due to significant anxiety around shocks.  He has  a hard time sleeping.  - Continue prn xanax. No change.   Schedule CPX. Labs today. Follow up 6-8 weeks.   Graciella Freer, PA-C  06/22/2016

## 2016-06-23 LAB — CUP PACEART INCLINIC DEVICE CHECK
Date Time Interrogation Session: 20180501110429
Implantable Lead Implant Date: 20180202
Implantable Lead Implant Date: 20180202
Implantable Lead Location: 753860
Implantable Lead Model: 5076
Implantable Pulse Generator Implant Date: 20180202
Lead Channel Pacing Threshold Amplitude: 0.5 V
Lead Channel Sensing Intrinsic Amplitude: 5.6 mV
MDC IDC LEAD IMPLANT DT: 20180202
MDC IDC LEAD LOCATION: 753858
MDC IDC LEAD LOCATION: 753859
MDC IDC MSMT LEADCHNL RA PACING THRESHOLD PULSEWIDTH: 0.4 ms
MDC IDC MSMT LEADCHNL RV PACING THRESHOLD AMPLITUDE: 1 V
MDC IDC MSMT LEADCHNL RV PACING THRESHOLD PULSEWIDTH: 0.4 ms
MDC IDC MSMT LEADCHNL RV SENSING INTR AMPL: 11.5 mV

## 2016-06-29 ENCOUNTER — Other Ambulatory Visit: Payer: Self-pay | Admitting: Physician Assistant

## 2016-06-29 ENCOUNTER — Ambulatory Visit (INDEPENDENT_AMBULATORY_CARE_PROVIDER_SITE_OTHER): Payer: Medicaid Other

## 2016-06-29 DIAGNOSIS — Z95810 Presence of automatic (implantable) cardiac defibrillator: Secondary | ICD-10-CM | POA: Diagnosis not present

## 2016-06-29 DIAGNOSIS — I5022 Chronic systolic (congestive) heart failure: Secondary | ICD-10-CM

## 2016-06-29 NOTE — Progress Notes (Signed)
Increase Lasix to 40 mg daily x 3 days then 40 mg daily alternating with 20 mg daily.  Will need BMET in 1 week.   Heather: Please call him and explain CPX.  Let him know it is very unlikely to cause VT.

## 2016-06-29 NOTE — Progress Notes (Signed)
EPIC Encounter for ICM Monitoring  Patient Name: [name] is a 61 y.o. male Date: 06/29/2016 Primary Care Physican: Andreas Blower, MD Primary Cardiologist: Crenshaw/McLean Electrophysiologist: Faustino Congress Weight:  214 lbs  Bi-V Pacing:  100%       ICM intro given and explained he was referred by Chanetta Marshall, NP at last office visit.  He agreed to [facility] monthly follow up. Heart Failure questions reviewed, pt has had some shortness of breath and has been taking extra Furosemide for last week as recommended by HF clinic.   Thoracic impedance abnormal suggesting fluid accumulation since 06/19/2016.  Prescribed dosage: Furosemide 40 mg 0.5 tablet (20 mg total) daily and taking extra as needed per HF note 06/22/2016  Labs: 06/22/2016 Creatinine 1.13, BUN 21, Potassium 4.4, Sodium 137, EGFR >60 05/27/2016 Creatinine 1.41, BUN 24, Potassium 4.9, Sodium 138, EGFR 53-61  05/20/2016 Creatinine 1.23, BUN 15, Potassium 4.8, Sodium 137, EGFR >60  04/23/2016 Creatinine 1.28, BUN 21, Potassium 4.4, Sodium 139  04/16/2016 Creatinine 1.37, BUN 25, Potassium 4.5, Sodium 137, EGFR >60  04/13/2016 Creatinine 1.27, BUN 21, Potassium 5.1, Sodium 139, EGFR 58->60  03/31/2016 Creatinine 1.10, BUN 19, Potassium 4.2, Sodium 138, EGFR >60  03/30/2016 Creatinine 1.13, BUN 18, Potassium 4.1, Sodium 135, EGFR >60  03/29/2016 Creatinine 1.04, BUN 19, Potassium 4.4, Sodium 136, EGFR >60   03/28/2016 Creatinine 1.09, BUN 16, Potassium 4.4, Sodium 135, EGFR >60    Recommendations:   He has been taking extra 1/2 tablet Furosemide for one week. Patient asked questions if he can consume alcohol and advised it is not recommended and he should talk to his physician.  He reported drinking alcohol socially but has probably drank more than usual in the last week due to some personal problems.  He is very anxious regarding the scheduled stress test for 07/01/2016 due to he has a history of device shocks.  He asked what the  procedure is for the stress test and would he get shocked.  Explained I would need to get more information and call him back.  Patient emotional and crying during conversation and provided support in attempt to help aleve stress and anxiety. Discussed to limit salt intake to 2000 mg/day and he admits not following low salt diet.  He asked about how much fluid he can have a day and would that include include alcohol.  Advised to limit fluid intake to < 2 liters/day and again recommended he should not be consuming alcohol without talking to his physician.  Provided ICM number and encouraged to call for fluid symptoms or use local ER for any urgent symptoms.  Follow-up plan: ICM clinic phone appointment on 07/21/2016.  91 day post implant office appointment scheduled on 07/07/2016 with Dr Caryl Comes and HF clinic 08/14/2016.  Copy of ICM check sent to primary cardiologist and device physician.   3 month ICM trend: 06/28/2016   1 Year ICM trend:      Rosalene Billings, RN 06/29/2016 9:46 AM

## 2016-06-30 MED ORDER — FUROSEMIDE 40 MG PO TABS: ORAL_TABLET | ORAL | 3 refills | Status: DC

## 2016-06-30 NOTE — Progress Notes (Signed)
Call to patient.  Advised Dr [name] ordered to increase Furosemide to 40 mg daily x 3 days then 40 mg daily alternating with 20 mg daily.  Will need BMET in 1 week [facility]/2016 prior to appointment with Dr Graciela HusbandsKlein.  Advised Dr [name] also wanted him to know that the CPX test is very unlikely to cause VT.  He asked if he could take Xanax before the test and advised Dr Alford HighlandMcLean's nurse, Herbert SetaHeather should be calling him today and he may ask her about what medications can be taken.  Dr Graciela HusbandsKlein will check defibrillator in office 5/15 and ICM next transmission 5/29

## 2016-07-01 ENCOUNTER — Ambulatory Visit (HOSPITAL_COMMUNITY): Payer: Medicaid Other | Attending: Cardiology

## 2016-07-01 DIAGNOSIS — R9439 Abnormal result of other cardiovascular function study: Secondary | ICD-10-CM | POA: Diagnosis not present

## 2016-07-01 DIAGNOSIS — I509 Heart failure, unspecified: Secondary | ICD-10-CM | POA: Insufficient documentation

## 2016-07-01 DIAGNOSIS — I5022 Chronic systolic (congestive) heart failure: Secondary | ICD-10-CM

## 2016-07-01 NOTE — Telephone Encounter (Signed)
Okay to refill? Please advise. Thanks, MI 

## 2016-07-02 NOTE — Telephone Encounter (Signed)
Patient need to follow up with PCP.

## 2016-07-07 ENCOUNTER — Ambulatory Visit (INDEPENDENT_AMBULATORY_CARE_PROVIDER_SITE_OTHER): Payer: Medicaid Other | Admitting: Internal Medicine

## 2016-07-07 ENCOUNTER — Encounter: Payer: Self-pay | Admitting: Internal Medicine

## 2016-07-07 ENCOUNTER — Other Ambulatory Visit: Payer: Medicaid Other

## 2016-07-07 VITALS — BP 106/62 | HR 84 | Ht 71.0 in | Wt 217.0 lb

## 2016-07-07 DIAGNOSIS — Z95810 Presence of automatic (implantable) cardiac defibrillator: Secondary | ICD-10-CM | POA: Diagnosis not present

## 2016-07-07 DIAGNOSIS — I441 Atrioventricular block, second degree: Secondary | ICD-10-CM | POA: Diagnosis not present

## 2016-07-07 DIAGNOSIS — I472 Ventricular tachycardia: Secondary | ICD-10-CM

## 2016-07-07 DIAGNOSIS — I5022 Chronic systolic (congestive) heart failure: Secondary | ICD-10-CM

## 2016-07-07 DIAGNOSIS — I255 Ischemic cardiomyopathy: Secondary | ICD-10-CM

## 2016-07-07 DIAGNOSIS — I4729 Other ventricular tachycardia: Secondary | ICD-10-CM

## 2016-07-07 MED ORDER — PAROXETINE HCL 20 MG PO TABS: 20.0000 mg | ORAL_TABLET | Freq: Every day | ORAL | 3 refills | Status: DC

## 2016-07-07 NOTE — Progress Notes (Signed)
Patient Care Team: Lonie Peak, PA-C as PCP - General (Physician Assistant) Jens Som Madolyn Frieze, MD as PCP - Cardiology (Cardiology) Patient, No Pcp Per (General Practice)   HPI  [name] is a 61 y.o. male is seen in followup for ICD implanted as per the MASTER trial with prior myocardial infarction complicated by shock and prior stenting.   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 This was done with amiodarone and lidocaine; later was transitioned to amiodarone    Left heart cath 4/13 >>There is a long stented segment in the proximal to mid LAD is patent. There is diffuse 10-20% narrowing within the stent. The recently placed stent in the mid LAD is widely patent. It jails the ostium of the second diagonal branch with a 90% lesion. Theres is TIMI-3 in the ostium of the D2. Left circumflex (LCx): A left circumflex is a large vessel. There is a 50-60% stenosis in the proximal vessel. He first obtuse marginal vessel is very small in caliber and is sub-totally occluded proximally. The second and third marginal branches are large vessels. There is 30% lesion in ostium of OM-2  Right coronary artery (RCA): The right coronary has an inferior take off. It is a codominant vessel. There is 40% narrowing at the ostium. A stent is noted in the mid right coronary and is widely patent with less than 10-20% irregularities. There is a 30-40% lesion in the distal right coronary. Stable CAD as described above. The D2 is jailed by the recently placed LAD stent but there is good flow He underwent stenting again in July 2014 Cardiac catheterization September 2017 showed an 85% second diagonal (covered by LAD stent; chronic), 99% first marginal (chronic) and patent stents in the proximal to mid LAD, proximal to mid circumflex, second obtuse marginal and mid right coronary artery; medical therapy recommended.   Echo 10/16  EF 15%   12/17  catheterization was undertaken due to the likelihood of suspicion because of the need to identify a trigger.  Results unchanged from 2017  January 2018 he presented with back pain he was found to have bacteremia and evidence of device infection. He underwent extraction and reimplantation 2/18 with a dual-chamber defibrillator and His bundle pacing    He underwent treadmill testing. He had a peak heart rate of 112 with moderate heart failure limitations.  He remains tearful and fear of his defibrillator.  We ended up putting him on Paxil for acute PTSD.   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 Anxiety   . Automatic implantable cardioverter-defibrillator in situ    a. Medtronic ICD.  [name] CAD (coronary artery disease)    a.  Severe LAD stenosis 2/2 acute thrombus - BMS 2009;  b. 06/01/11 Cath - LAD 20 isr, 82m (3.0x16 Veri-flex BMS), OM1 100p, EF 20-25%;  c. 07/2012 Abnl Cardiolite;  d. 08/2012 Cath/PCI: LM nl, LAD patent stents, D2 80-90 (jailed), LCX 90 p/m (4.0x24 Promus Premier DES), OM1 100, OM2 80-90p (3.0x20 Promus Premier DES), RCA patent mid stent, 40d, EF 25%.  e. 10/29/15 LHC stable disease with patent stents  . Chronic systolic CHF (congestive heart failure), NYHA class 3 (HCC) - low output state 12/12/2014  . Depression   . Dyspnea   . History of rheumatic fever 1962  . HLD (hyperlipidemia)  mixed  . HTN (hypertension)   . Ischemic cardiomyopathy    a.  EF 30-35% 2010;  b.  EF 20-25% by LV gram 08/2012. c. EF 15% by echo 11/2014.  [name] Myocardial infarction [facility]) 1998; 2002; ~ 2010  . Paroxysmal ventricular tachycardia (HCC)    a. VFlutter  CL 210 msec  Rx shock 04/2011  . PVC's (premature ventricular contractions)   . RBBB   . TIA (transient ischemic attack)    a. Dx 11/2014, continued on ASA/Plavix.    Past Surgical History:  Procedure Laterality Date  . CARDIAC  CATHETERIZATION N/A 10/29/2015   Procedure: Right/Left Heart Cath and Coronary Angiography;  Surgeon: Peter M Swaziland, MD;  Location: [facility] INVASIVE CV LAB;  Service: Cardiovascular;  Laterality: N/A;  . CARDIAC CATHETERIZATION N/A 03/20/2016   Procedure: Right Heart Cath;  Surgeon: Laurey Morale, MD;  Location: [facility] INVASIVE CV LAB;  Service: Cardiovascular;  Laterality: N/A;  . CARDIAC DEFIBRILLATOR PLACEMENT     MDT single chamber primary prevention ICD implanted by Dr Graciela Husbands as part of MASTER study  . EP IMPLANTABLE DEVICE N/A 12/11/2015   Procedure: BiV Upgrade;  Surgeon: [name], MD;  Location: [facility] INVASIVE CV LAB;  Service: Cardiovascular;  Laterality: N/A;  . EP IMPLANTABLE DEVICE N/A 12/11/2015   Procedure: Pocket Revision/Relocation;  Surgeon: [name], MD;  Location: [facility] INVASIVE CV LAB;  Service: Cardiovascular;  Laterality: N/A;  . EP IMPLANTABLE DEVICE N/A 12/11/2015   Procedure: Lead Revision/Repair;  Surgeon: [name], MD;  Location: [facility] INVASIVE CV LAB;  Service: Cardiovascular;  Laterality: N/A;  . EP IMPLANTABLE DEVICE N/A 03/23/2016   Procedure: Temporary placement of pacemaker lead and generator;  Surgeon: Marinus Maw, MD;  Location: [facility] OR;  Service: Cardiovascular;  Laterality: N/A;  . EP IMPLANTABLE DEVICE N/A 03/27/2016   Procedure: ICD Implant;  Surgeon: [name], MD;  Location: [facility] INVASIVE CV LAB;  Service: Cardiovascular;  Laterality: N/A;  . GENERATOR REMOVAL N/A 03/23/2016   Procedure: DEVICE REMOVAL;  Surgeon: Marinus Maw, MD;  Location: [facility] OR;  Service: Cardiovascular;  Laterality: N/A;  PVT to back up  . HERNIA REPAIR     "w/gallbladder OR" (08/29/2012)  . IR GENERIC HISTORICAL  03/31/2016   IR US GUIDE VASC ACCESS LEFT 03/31/2016 Simonne Come, MD [facility]-INTER[facility] RAD  . IR GENERIC HISTORICAL  03/31/2016   IR FLUORO GUIDE CV MIDLINE PICC LEFT 03/31/2016 Simonne Come, MD [facility]  . LAPAROSCOPIC CHOLECYSTECTOMY    . LEFT HEART CATHETERIZATION WITH CORONARY  ANGIOGRAM N/A 06/08/2011   Procedure: LEFT HEART CATHETERIZATION WITH CORONARY ANGIOGRAM;  Surgeon: Dolores Patty, MD;  Location: [facility] CATH LAB;  Service: Cardiovascular;  Laterality: N/A;  . PERCUTANEOUS CORONARY STENT INTERVENTION (PCI-S) N/A 06/01/2011   Procedure: PERCUTANEOUS CORONARY STENT INTERVENTION (PCI-S);  Surgeon: Peter M Swaziland, MD;  Location: [facility] CATH LAB;  Service: Cardiovascular;  Laterality: N/A;  . PERCUTANEOUS CORONARY STENT INTERVENTION (PCI-S) N/A 08/29/2012   Procedure: PERCUTANEOUS CORONARY STENT INTERVENTION (PCI-S);  Surgeon: Peter M Swaziland, MD;  Location: [facility] CATH LAB;  Service: Cardiovascular;  Laterality: N/A;  . PERIPHERAL VASCULAR CATHETERIZATION  12/11/2015   Procedure: Upper Extremity Venography;  Surgeon: [name], MD;  Location: [facility] INVASIVE CV LAB;  Service: Cardiovascular;;  . TEE WITHOUT CARDIOVERSION N/A 03/20/2016   Procedure: TRANSESOPHAGEAL ECHOCARDIOGRAM (TEE);  Surgeon: Vesta Mixer, MD;  Location: [facility] ENDOSCOPY;  Service: Cardiovascular;  Laterality: N/A;  . TEE WITHOUT CARDIOVERSION N/A 03/23/2016  Procedure: TRANSESOPHAGEAL ECHOCARDIOGRAM (TEE);  Surgeon: Marinus Maw, MD;  Location: [facility] OR;  Service: Cardiovascular;  Laterality: N/A;    Current Outpatient Prescriptions  Medication Sig Dispense Refill  . ALPRAZolam (XANAX) 0.25 MG tablet Take 1 tablet (0.25 mg total) by mouth at bedtime as needed for anxiety. F/u with PCP for refills. 30 tablet 0  . amiodarone (PACERONE) 200 MG tablet Take 1 tablet (200 mg total) by mouth daily. 30 tablet 3  . aspirin EC 81 MG tablet Take 81 mg by mouth daily.    [name] atorvastatin (LIPITOR) 40 MG tablet Take 1 tablet (40 mg total) by mouth daily. 30 tablet 5  . bisoprolol (ZEBETA) 5 MG tablet Take 0.5 tablets (2.5 mg total) by mouth at bedtime. 15 tablet 11  . clopidogrel (PLAVIX) 75 MG tablet Take 1 tablet (75 mg total) by mouth daily. 30 tablet 5  . digoxin (LANOXIN) 0.125 MG tablet Take one tablet (0.125 mg) by  mouth once every other day 30 tablet 3  . famotidine (PEPCID) 20 MG tablet Take 1 tablet (20 mg total) by mouth 2 (two) times daily. 60 tablet 6  . furosemide (LASIX) 40 MG tablet Take 1 tablet (40 mg total) every other day alternating with 0.5 tablet (20 mg total) every other day. 90 tablet 3  . hydrOXYzine (VISTARIL) 25 MG capsule Take 25 mg by mouth at bedtime as needed (sleep).    . losartan (COZAAR) 25 MG tablet Take 1 tablet (25 mg total) by mouth daily. 90 tablet 3  . magnesium oxide (MAG-OX) 400 (241.3 Mg) MG tablet Take 1 tablet (400 mg total) by mouth daily. 30 tablet 6  . mexiletine (MEXITIL) 200 MG capsule TAKE ONE CAPSULE BY MOUTH EVERY 12 HOURS 60 capsule 3  . NITROSTAT 0.4 MG SL tablet PLACE 1 TABLET (0.4 MG TOTAL) UNDER THE TONGUE EVERY 5 (FIVE) MINUTES AS NEEDED FOR CHEST PAIN 25 tablet 6  . PARoxetine (PAXIL) 10 MG tablet Take 1 tablet (10 mg total) by mouth daily. 30 tablet 3  . spironolactone (ALDACTONE) 25 MG tablet Take 1 tablet (25 mg total) by mouth daily. 30 tablet 6  . triamcinolone cream (KENALOG) 0.1 % USE 1 APPLICATION TOPICALLY TO THE AFFECTED AREA TWICE A DAY AS NEEDED FOR ECZEMA  5   No current facility-administered medications for this visit.     No Known Allergies  Review of Systems negative except from HPI and PMH  Physical Exam BP 106/62   Pulse 84   Ht 5\' 11"  (1.803 m)   Wt 217 lb (98.4 kg)   SpO2 98%   BMI 30.27 kg/m  Well developed and nourished in no acute distress HENT normal Neck supple with JVP-flat Clear Regular rate and rhythm, no murmurs or gallops Abd-soft with active BS No Clubbing cyanosis edema Skin-warm and dry A & Oriented  Grossly normal sensory and motor function tearful  ECG demonstrates Atrial paced rhythm with biventricular pacing with a diffuse complex between RV pacing and His bundle pacing  Assessment and  Plan  Ischemic cardiomyopathy Without symptoms of ischemia   PTSD  Complete heart block  Ventricular  tachycardia Storm  Implantable defibrillator -Medtronic   CRT-D with His bundle pacing and RV apical pacing  \   interval ventricular tachycardia. We will continue him on amiodarone   There is high threshold requirements for the His bundle lead. There is fusion however almost these at about 2.5 V. We will leave in 2.5 V so as to  not deplete the battery as 100% pacing and requires greater than 8..   With this PTSD we will increase his Paxil from 10--20   We spent more than 50% of our >25 min visit in face to face counseling regarding the above

## 2016-07-07 NOTE — Patient Instructions (Signed)
Medication Instructions: - Your physician has recommended you make the following change in your medication:  1) Increase paxil to 20 mg- take one tablet by mouth once daily  Labwork: - none ordered  Procedures/Testing: - none ordered  Follow-Up: - Remote monitoring is used to monitor your Pacemaker of ICD from home. This monitoring reduces the number of office visits required to check your device to one time per year. It allows us to keep an eye on the functioning of your device to ensure it is working properly. You are scheduled for a device check from home on 10/06/16. You may send your transmission at any time that day. If you have a wireless device, the transmission will be sent automatically. After your physician reviews your transmission, you will receive a postcard with your next transmission date.  - Your physician wants you to follow-up in: 6 months with Dr. Graciela HusbandsKlein.  You will receive a reminder letter in the mail two months in advance. If you don't receive a letter, please call our office to schedule the follow-up appointment.   Any Additional Special Instructions Will Be Listed Below (If Applicable).     If you need a refill on your cardiac medications before your next appointment, please call your pharmacy.

## 2016-07-08 LAB — BASIC METABOLIC PANEL
BUN/Creatinine Ratio: 20 (ref 10–24)
BUN: 27 mg/dL (ref 8–27)
CALCIUM: 9.8 mg/dL (ref 8.6–10.2)
CHLORIDE: 101 mmol/L (ref 96–106)
CO2: 22 mmol/L (ref 18–29)
Creatinine, Ser: 1.36 mg/dL — ABNORMAL HIGH (ref 0.76–1.27)
GFR calc Af Amer: 64 mL/min/{1.73_m2} (ref >59)
GFR calc non Af Amer: 55 mL/min/{1.73_m2} — ABNORMAL LOW (ref >59)
Glucose: 102 mg/dL — ABNORMAL HIGH (ref 65–99)
POTASSIUM: 4.2 mmol/L (ref 3.5–5.2)
Sodium: 139 mmol/L (ref 134–144)

## 2016-07-09 LAB — CUP PACEART INCLINIC DEVICE CHECK
Brady Statistic AP VS Percent: 0.02 %
Brady Statistic AS VS Percent: 0.02 %
HIGH POWER IMPEDANCE MEASURED VALUE: 81 Ohm
Implantable Lead Implant Date: 20180202
Implantable Lead Implant Date: 20180202
Implantable Lead Implant Date: 20180202
Implantable Lead Location: 753858
Implantable Pulse Generator Implant Date: 20180202
Lead Channel Impedance Value: 228 Ohm
Lead Channel Impedance Value: 342 Ohm
Lead Channel Pacing Threshold Amplitude: 0.5 V
Lead Channel Pacing Threshold Amplitude: 6 V
Lead Channel Pacing Threshold Pulse Width: 0.4 ms
Lead Channel Setting Pacing Amplitude: 1.5 V
Lead Channel Setting Pacing Amplitude: 2.5 V
Lead Channel Setting Pacing Pulse Width: 1 ms
MDC IDC LEAD LOCATION: 753859
MDC IDC LEAD LOCATION: 753860
MDC IDC MSMT BATTERY REMAINING LONGEVITY: 73 mo
MDC IDC MSMT BATTERY VOLTAGE: 2.95 V
MDC IDC MSMT LEADCHNL LV IMPEDANCE VALUE: 304 Ohm
MDC IDC MSMT LEADCHNL LV IMPEDANCE VALUE: 475 Ohm
MDC IDC MSMT LEADCHNL LV PACING THRESHOLD PULSEWIDTH: 1.5 ms
MDC IDC MSMT LEADCHNL RA IMPEDANCE VALUE: 456 Ohm
MDC IDC MSMT LEADCHNL RA SENSING INTR AMPL: 3.5 mV
MDC IDC MSMT LEADCHNL RV IMPEDANCE VALUE: 475 Ohm
MDC IDC MSMT LEADCHNL RV PACING THRESHOLD AMPLITUDE: 0.75 V
MDC IDC MSMT LEADCHNL RV PACING THRESHOLD PULSEWIDTH: 0.4 ms
MDC IDC MSMT LEADCHNL RV SENSING INTR AMPL: 11.5 mV
MDC IDC SESS DTM: 20180515204515
MDC IDC SET LEADCHNL RV PACING AMPLITUDE: 2 V
MDC IDC SET LEADCHNL RV PACING PULSEWIDTH: 0.4 ms
MDC IDC SET LEADCHNL RV SENSING SENSITIVITY: 0.3 mV
MDC IDC STAT BRADY AP VP PERCENT: 65.76 %
MDC IDC STAT BRADY AS VP PERCENT: 34.2 %
MDC IDC STAT BRADY RA PERCENT PACED: 65.66 %
MDC IDC STAT BRADY RV PERCENT PACED: 99.93 %

## 2016-07-09 NOTE — Addendum Note (Signed)
Addended by: Dareen PianoKENAN, Aadit Hagood C on: 07/09/2016 08:13 AM   Modules accepted: Orders

## 2016-07-21 ENCOUNTER — Encounter (HOSPITAL_COMMUNITY): Payer: Self-pay | Admitting: *Deleted

## 2016-07-21 ENCOUNTER — Ambulatory Visit (INDEPENDENT_AMBULATORY_CARE_PROVIDER_SITE_OTHER): Payer: Self-pay

## 2016-07-21 ENCOUNTER — Telehealth: Payer: Self-pay | Admitting: Cardiology

## 2016-07-21 DIAGNOSIS — I5022 Chronic systolic (congestive) heart failure: Secondary | ICD-10-CM

## 2016-07-21 DIAGNOSIS — Z95810 Presence of automatic (implantable) cardiac defibrillator: Secondary | ICD-10-CM

## 2016-07-21 NOTE — Telephone Encounter (Signed)
Spoke with pt and reminded pt of remote transmission that is due today. Pt verbalized understanding.   

## 2016-07-21 NOTE — Progress Notes (Signed)
EPIC Encounter for ICM Monitoring  Patient Name: [name] is a 61 y.o. male Date: 07/21/2016 Primary Care Physican: Cyndi Bender, PA-C Primary Cardiologist: Crenshaw/[place] Electrophysiologist: Caryl Comes Dry Weight:  Last known weight 214 lbs  Bi-V Pacing:  100%            Heart Failure questions reviewed, pt has had the flu and bronchitis for last week.   Patient's mother passed away last week.   Thoracic impedance abnormal suggesting fluid accumulation since 07/12/2016.  Prescribed dosage: Furosemide 40 mg 0.5 tablet (20 mg total) daily and taking extra as needed per HF note 06/22/2016.  Labs: 06/22/2016 Creatinine 1.13, BUN 21, Potassium 4.4, Sodium 137, EGFR >60 05/27/2016 Creatinine 1.41, BUN 24, Potassium 4.9, Sodium 138, EGFR 53-61  05/20/2016 Creatinine 1.23, BUN 15, Potassium 4.8, Sodium 137, EGFR >60  04/23/2016 Creatinine 1.28, BUN 21, Potassium 4.4, Sodium 139  04/16/2016 Creatinine 1.37, BUN 25, Potassium 4.5, Sodium 137, EGFR >60  04/13/2016 Creatinine 1.27, BUN 21, Potassium 5.1, Sodium 139, EGFR 58->60  03/31/2016 Creatinine 1.10, BUN 19, Potassium 4.2, Sodium 138, EGFR >60  03/30/2016 Creatinine 1.13, BUN 18, Potassium 4.1, Sodium 135, EGFR >60  03/29/2016 Creatinine 1.04, BUN 19, Potassium 4.4, Sodium 136, EGFR >60   03/28/2016 Creatinine 1.09, BUN 16, Potassium 4.4, Sodium 135, EGFR >60   Recommendations:  Advised to take Furosemide 40 mg 1 tablet x 3 days and then resume 0.5 tablet daily.    Follow-up plan: ICM clinic phone appointment on 07/27/2016.  Office appointment scheduled on 08/14/2016 with HF clinic.  Copy of ICM check sent to Dr Aundra Dubin and Dr Caryl Comes for review.   3 month ICM trend: 07/21/2016   1 Year ICM trend:      Rosalene Billings, RN 07/21/2016 11:55 AM

## 2016-07-27 ENCOUNTER — Telehealth: Payer: Self-pay

## 2016-07-27 ENCOUNTER — Ambulatory Visit (INDEPENDENT_AMBULATORY_CARE_PROVIDER_SITE_OTHER): Payer: Medicaid Other

## 2016-07-27 DIAGNOSIS — Z95810 Presence of automatic (implantable) cardiac defibrillator: Secondary | ICD-10-CM

## 2016-07-27 DIAGNOSIS — I5022 Chronic systolic (congestive) heart failure: Secondary | ICD-10-CM

## 2016-07-27 NOTE — Progress Notes (Signed)
EPIC Encounter for ICM Monitoring  Patient Name: [name] is a 61 y.o. male Date: 07/27/2016 Primary Care Physican: Cyndi Bender, PA-C Primary Cardiologist: Crenshaw/[place] Electrophysiologist: Faustino Congress Weight:*Last known weight 214lbs  Bi-V Pacing: 99.9%          Attempted call to patient and unable to reach.   Transmission reviewed.    Thoracic impedance returned to normal after taking increased Furosemide but is starting to trend below baseline suggesting re-accumulation of fluid  Prescribed dosage: Furosemide 40 mg 0.5 tablet (20 mg total) daily and taking extra as needed per HF note 06/22/2016.  Labs: 06/22/2016 Creatinine 1.13, BUN 21, Potassium 4.4, Sodium 137, EGFR >60 05/27/2016 Creatinine 1.41, BUN 24, Potassium 4.9, Sodium 138, EGFR 53-61  05/20/2016 Creatinine 1.23, BUN 15, Potassium 4.8, Sodium 137, EGFR >60  04/23/2016 Creatinine 1.28, BUN 21, Potassium 4.4, Sodium 139  02/22/2018Creatinine 1.37, BUN 25, Potassium 4.5, Sodium 137, EGFR >60  02/19/2018Creatinine 1.27, BUN 21, Potassium 5.1, Sodium 139, EGFR 58->60  02/06/2018Creatinine 1.10, BUN 19, Potassium 4.2, Sodium 138, EGFR >60  02/05/2018Creatinine 1.13, BUN 18, Potassium 4.1, Sodium 135, EGFR >60  02/04/2018Creatinine 1.04, BUN 19, Potassium 4.4, Sodium 136, EGFR >60  02/03/2018Creatinine 1.09, BUN 16, Potassium 4.4, Sodium 135, EGFR >60   Recommendations: NONE - Unable to reach patient   Follow-up plan: ICM clinic phone appointment on 08/06/2016.  Office appointment scheduled on 08/14/2016 with HF Clinic  Copy of ICM check sent to primary cardiologist and device physician.   3 month ICM trend: 07/27/2016   1 Year ICM trend:      Rosalene Billings, RN 07/27/2016 8:27 AM

## 2016-07-27 NOTE — Telephone Encounter (Signed)
Remote ICM transmission received.  Attempted patient call and no message. 

## 2016-07-28 NOTE — Progress Notes (Signed)
Patient returned call.  Transmission reviewed and advised the increased Furosemide helped him get back to baseline but he was starting to accumulate fluid again.  Advised to limit fluid intake to 64 oz and salt to 2000 mg daily.  He said he had a few beers and that may be causing the fluid retention.  Patient has HF clinic appt on 08/14/2016.  Next ICM remote transmission 08/06/2016.  Encouraged to call for any fluid symptoms.

## 2016-07-29 ENCOUNTER — Other Ambulatory Visit: Payer: Self-pay | Admitting: Internal Medicine

## 2016-08-06 ENCOUNTER — Ambulatory Visit (INDEPENDENT_AMBULATORY_CARE_PROVIDER_SITE_OTHER): Payer: Medicaid Other

## 2016-08-06 DIAGNOSIS — I5022 Chronic systolic (congestive) heart failure: Secondary | ICD-10-CM | POA: Diagnosis not present

## 2016-08-06 DIAGNOSIS — Z95810 Presence of automatic (implantable) cardiac defibrillator: Secondary | ICD-10-CM

## 2016-08-06 NOTE — Progress Notes (Signed)
EPIC Encounter for ICM Monitoring  Patient Name: [name] is a 61 y.o. male Date: 08/06/2016 Primary Care Physican: Cyndi Bender, PA-C Primary Cardiologist: Crenshaw/McLean Electrophysiologist: Faustino Congress Weight: 212lbs  Bi-V Pacing: 99.9%      Heart Failure questions reviewed, pt asymptomatic    Thoracic impedance normal.  Prescribed dosage: Furosemide 40 mg 0.5 tablet (20 mg total) daily and taking extra as needed per HF note 06/22/2016.  Labs: 06/22/2016 Creatinine 1.13, BUN 21, Potassium 4.4, Sodium 137, EGFR >60 05/27/2016 Creatinine 1.41, BUN 24, Potassium 4.9, Sodium 138, EGFR 53-61  05/20/2016 Creatinine 1.23, BUN 15, Potassium 4.8, Sodium 137, EGFR >60  04/23/2016 Creatinine 1.28, BUN 21, Potassium 4.4, Sodium 139  02/22/2018Creatinine 1.37, BUN 25, Potassium 4.5, Sodium 137, EGFR >60  02/19/2018Creatinine 1.27, BUN 21, Potassium 5.1, Sodium 139, EGFR 58->60  02/06/2018Creatinine 1.10, BUN 19, Potassium 4.2, Sodium 138, EGFR >60  02/05/2018Creatinine 1.13, BUN 18, Potassium 4.1, Sodium 135, EGFR >60  02/04/2018Creatinine 1.04, BUN 19, Potassium 4.4, Sodium 136, EGFR >60  02/03/2018Creatinine 1.09, BUN 16, Potassium 4.4, Sodium 135, EGFR >60   Recommendations: No changes.  Advised to limit salt intake to 2000 mg/day and fluid intake to < 2 liters/day.  Encouraged to call for fluid symptoms.  Follow-up plan: ICM clinic phone appointment on 09/07/2016.  Copy of ICM check sent to device physician.   3 month ICM trend: 08/06/2016   1 Year ICM trend:      Rosalene Billings, RN 08/06/2016 10:48 AM

## 2016-08-14 ENCOUNTER — Encounter (HOSPITAL_COMMUNITY): Payer: Self-pay

## 2016-08-14 ENCOUNTER — Ambulatory Visit (HOSPITAL_COMMUNITY)
Admission: RE | Admit: 2016-08-14 | Discharge: 2016-08-14 | Disposition: A | Discharge status: Home / Self Care | Payer: Medicaid Other | Source: Ambulatory Visit | Attending: Cardiology | Admitting: Cardiology

## 2016-08-14 VITALS — BP 117/55 | HR 72 | Ht 71.0 in | Wt 214.5 lb

## 2016-08-14 DIAGNOSIS — Z95810 Presence of automatic (implantable) cardiac defibrillator: Secondary | ICD-10-CM | POA: Insufficient documentation

## 2016-08-14 DIAGNOSIS — F4310 Post-traumatic stress disorder, unspecified: Secondary | ICD-10-CM | POA: Insufficient documentation

## 2016-08-14 DIAGNOSIS — I5022 Chronic systolic (congestive) heart failure: Secondary | ICD-10-CM

## 2016-08-14 DIAGNOSIS — I340 Nonrheumatic mitral (valve) insufficiency: Secondary | ICD-10-CM | POA: Diagnosis not present

## 2016-08-14 DIAGNOSIS — Z79899 Other long term (current) drug therapy: Secondary | ICD-10-CM | POA: Insufficient documentation

## 2016-08-14 DIAGNOSIS — I4510 Unspecified right bundle-branch block: Secondary | ICD-10-CM | POA: Insufficient documentation

## 2016-08-14 DIAGNOSIS — Z7982 Long term (current) use of aspirin: Secondary | ICD-10-CM | POA: Diagnosis not present

## 2016-08-14 DIAGNOSIS — I712 Thoracic aortic aneurysm, without rupture: Secondary | ICD-10-CM | POA: Diagnosis not present

## 2016-08-14 DIAGNOSIS — I4729 Other ventricular tachycardia: Secondary | ICD-10-CM

## 2016-08-14 DIAGNOSIS — Z823 Family history of stroke: Secondary | ICD-10-CM | POA: Insufficient documentation

## 2016-08-14 DIAGNOSIS — I255 Ischemic cardiomyopathy: Secondary | ICD-10-CM | POA: Insufficient documentation

## 2016-08-14 DIAGNOSIS — Z8249 Family history of ischemic heart disease and other diseases of the circulatory system: Secondary | ICD-10-CM | POA: Diagnosis not present

## 2016-08-14 DIAGNOSIS — I2510 Atherosclerotic heart disease of native coronary artery without angina pectoris: Secondary | ICD-10-CM | POA: Diagnosis not present

## 2016-08-14 DIAGNOSIS — I472 Ventricular tachycardia: Secondary | ICD-10-CM | POA: Diagnosis not present

## 2016-08-14 DIAGNOSIS — Z7902 Long term (current) use of antithrombotics/antiplatelets: Secondary | ICD-10-CM | POA: Diagnosis not present

## 2016-08-14 DIAGNOSIS — Z87891 Personal history of nicotine dependence: Secondary | ICD-10-CM | POA: Diagnosis not present

## 2016-08-14 DIAGNOSIS — R001 Bradycardia, unspecified: Secondary | ICD-10-CM | POA: Insufficient documentation

## 2016-08-14 DIAGNOSIS — Z809 Family history of malignant neoplasm, unspecified: Secondary | ICD-10-CM | POA: Diagnosis not present

## 2016-08-14 DIAGNOSIS — Z8673 Personal history of transient ischemic attack (TIA), and cerebral infarction without residual deficits: Secondary | ICD-10-CM | POA: Diagnosis not present

## 2016-08-14 LAB — COMPREHENSIVE METABOLIC PANEL
ALBUMIN: 4 g/dL (ref 3.5–5.0)
ALK PHOS: 49 U/L (ref 38–126)
ALT: 22 U/L (ref 17–63)
AST: 26 U/L (ref 15–41)
Anion gap: 9 (ref 5–15)
BILIRUBIN TOTAL: 1 mg/dL (ref 0.3–1.2)
BUN: 20 mg/dL (ref 6–20)
CALCIUM: 9.4 mg/dL (ref 8.9–10.3)
CO2: 25 mmol/L (ref 22–32)
CREATININE: 1.56 mg/dL — AB (ref 0.61–1.24)
Chloride: 103 mmol/L (ref 101–111)
GFR calc Af Amer: 53 mL/min — ABNORMAL LOW (ref >60)
GFR calc non Af Amer: 46 mL/min — ABNORMAL LOW (ref >60)
GLUCOSE: 95 mg/dL (ref 65–99)
Potassium: 4.1 mmol/L (ref 3.5–5.1)
Sodium: 137 mmol/L (ref 135–145)
TOTAL PROTEIN: 7.3 g/dL (ref 6.5–8.1)

## 2016-08-14 LAB — TYPE AND SCREEN
ABO/RH(D): A NEG
Antibody Screen: NEGATIVE

## 2016-08-14 MED ORDER — SILDENAFIL CITRATE 20 MG PO TABS: 20.0000 mg | ORAL_TABLET | ORAL | 0 refills | Status: DC | PRN

## 2016-08-14 MED ORDER — LOSARTAN POTASSIUM 25 MG PO TABS: ORAL_TABLET | ORAL | 3 refills | Status: DC

## 2016-08-14 MED ORDER — FUROSEMIDE 40 MG PO TABS: 40.0000 mg | ORAL_TABLET | Freq: Every day | ORAL | 3 refills | Status: DC

## 2016-08-14 MED ORDER — SILDENAFIL CITRATE 25 MG PO TABS: 25.0000 mg | ORAL_TABLET | Freq: Every day | ORAL | 3 refills | Status: DC | PRN

## 2016-08-14 NOTE — Patient Instructions (Signed)
Labs today (will call for abnormal results, otherwise no news is good news)  INCREASE Losartan to 25 mg (1 Tablet) in the AM and 12.5 mg (0.5 Tablet) in the PM.  Take Lasix 40 mg (1 Tablet) Once Daily.  For today and tomorrow take 40 mg (1 Tablet) Twice Daily.  START taking Viagra as needed.  Please schedule an eye exam as soon as possible.  You should have your eyes checked due to side effects of amiodarone.   You have been referred to Cardiac rehab, they will contact you to schedule your initial appointment.  [facility] we will send your records to them.   Labs in 10 days(bmet)  Follow up in 2 Months

## 2016-08-15 ENCOUNTER — Other Ambulatory Visit: Payer: Self-pay | Admitting: Cardiology

## 2016-08-16 NOTE — Progress Notes (Signed)
Cardiology: Dr. Jens Som HF Cardiology: Dr. Shirlee Latch    Advanced Heart Failure Clinic Note   61 yo with history of smoking, CAD s/p multiple PCIs, chronic systolic CHF/ischemic cardiomyopathy presents for HF clinic followup.  Cardiac cath in 9/17 showed stable anatomy, no intervention.  EF has been markedly reduced for several years now.  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 PICC was placed and low cardiac output confirmed.  He was started on milrinone and diuresed.  He had CRT upgrade of his Medtronic device.    He was admitted again in 12/17 with multiple ICD discharges in the setting of VT storm.  He was started on amiodarone and mexiletine.  He has had PTSD symptoms since that time. He has had no further VT.  In the hospital, LV lead was turned off (?pro-arrhythmic).  Coronary angiography was done, showing no change compared to 9/17.    Admitted 1/23 - 03/31/16 with acute on chronic CHF.  BCx drawn with worsening back pain and PICC line in place. Found to have Coag negative staph 2/2 BCx. Started on IV antibiotics. Found to have ICD infection with ulceration and drainage from site.  ICD removed 03/23/16 and replaced 03/27/16 with dual chamber ICD with His bundle pacing. IV ABX continued until 04/06/16.   He returns for followup today.  No ICD discharges.  He quit smoking in 2/18. Weight is down 4 lbs today. He started driving again this month.  CPX in 5/18 with moderate HF limitation.  Able to walk on flat ground without much difficulty.  Still has periodic dyspnea but seems to come and go with no particular trigger.  No orthopnea/PND.  No chest pain.  He is taking Lasix 40 mg daily.  He was unable to tolerate Entresto due to orthostasis and is back on losartan.  Generally not getting lightheaded now.   Optivol: Fluid index > threshold but impedance trending back up. No VT.   Labs (10/17): hgb 15.8,  K 4.3, creatinine 1.11 => 0.98, AST 78 => 43, ALT 105 => 61, digoxin 0.4 Labs (12/17): digoxin < 0.5, mg 1.8, K 4.4, creatinine 1.23 Labs (3/18): K 4.4, creatinine 1.28 Labs (4/18): BNP 510 Labs (5/18): K 4.2, creatinine 1.36  PMH: 1. H/o smoking: PFTs (6/17) with minimal obstruction on PFTs. Quit 2/18.  2. Coronary artery disease: Multiple PCIs in the past.  - LHC/RHC (9/17): 85% ostial D2, 99% ostial OM1 (no change from prior) with patent LAD, LCX, OM2, and RCA stents; mean RA 8, PA 58/27 mean 39, mean PCWP 29, CI 2.25, PVR 2.1.  - LHC (12/17): No change compared to 9/17.  3. Chronic systolic CHF: Ischemic cardiomyopathy.   - Echo (10/17): EF 15%, moderate MR, mild to moderate AI, mildly decreased RV systolic function.  - Medtronic CRT-D device => LV lead turned off due to concern for possible pro-arrhythmia.  - CPX (11/17): peak VO2 20.6, VE/VCO2 slope 38, RER 0.97.  Submaximal, probably mild to moderate HF limitation.  - TEE (03/20/16) with LVEF 15% with mod/severe MR.  - RHC (1/18): mean RA 1, PA 36/11, mean PCWP 7, CI 3.06 Fick/3.08 thermo - 1/18 device infection, CRT-D system removed and replaced with a Medtronic ICD with His bundle pacing.  - CPX (5/18): Peak VO2 16.3 (62% predicted), VE/VCO2 slope 44, RER 1.08 => moderate HF limitation.  4. 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  5. H/o TIA 6. Ascending aortic aneurysm: 4.1 cm on CTA chest in 8/17.  7. Atrial tachycardia: Noted on device interrogation.  8. 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  LV lead turned off due to concern for pro-arrhythmia.  9. Mitral regurgitation: Moderate to severe on 1/18 TEE, likely functional.   ROS: All systems reviewed and negative except as per HPI.   Social History   Social History  . Marital status: Legally Separated    Spouse name: N/A  . Number of children: N/A  . Years of education: N/A   Occupational History  . Not on file.    Social History Main Topics  . Smoking status: Former Smoker    Packs/day: 1.00    Years: 47.00    Types: Cigarettes    Quit date: 11/20/2015  . Smokeless tobacco: Never Used  . Alcohol use 3.6 oz/week    6 Cans of beer per week  . Drug use: No  . Sexual activity: Not Currently   Other Topics Concern  . Not on file   Social History Narrative   Married; full time.    Family History  Problem Relation Age of Onset  . Cancer Father   . Cancer Sister        twin sister and only one has cancer, unknown what kind  . Hypertension Brother   . Cancer Unknown        family hx  . Heart attack Neg Hx   . Stroke Neg Hx    Current Outpatient Prescriptions  Medication Sig Dispense Refill  . ALPRAZolam (XANAX) 0.25 MG tablet Take 1 tablet (0.25 mg total) by mouth at bedtime as needed for anxiety. F/u with PCP for refills. 30 tablet 0  . amiodarone (PACERONE) 200 MG tablet Take 1 tablet (200 mg total) by mouth daily. 30 tablet 3  . aspirin EC 81 MG tablet Take 81 mg by mouth daily.    [name]. atorvastatin (LIPITOR) 40 MG tablet Take 1 tablet (40 mg total) by mouth daily. 30 tablet 5  . bisoprolol (ZEBETA) 5 MG tablet Take 0.5 tablets (2.5 mg total) by mouth at bedtime. 15 tablet 11  . clopidogrel (PLAVIX) 75 MG tablet Take 1 tablet (75 mg total) by mouth daily. 30 tablet 5  . digoxin (LANOXIN) 0.125 MG tablet Take one tablet (0.125 mg) by mouth once every other day 30 tablet 3  . famotidine (PEPCID) 20 MG tablet Take 1 tablet (20 mg total) by mouth 2 (two) times daily. 60 tablet 6  . furosemide (LASIX) 40 MG tablet Take 1 tablet (40 mg total) by mouth daily. 32 tablet 3  . hydrOXYzine (VISTARIL) 25 MG capsule Take 25 mg by mouth at bedtime as needed (sleep).    . losartan (COZAAR) 25 MG tablet Take 25 mg (1 Tablet) in the AM, and 12.5 mg (0.5 Tablet) in the PM. 45 tablet 3  . magnesium oxide (MAG-OX) 400 (241.3 Mg) MG tablet Take 1 tablet (400 mg total) by mouth daily. 30 tablet 6  . mexiletine  (MEXITIL) 200 MG capsule TAKE ONE CAPSULE BY MOUTH EVERY 12 HOURS 60 capsule 3  . NITROSTAT 0.4 MG SL tablet PLACE 1 TABLET (0.4 MG TOTAL) UNDER THE TONGUE EVERY 5 (FIVE) MINUTES AS NEEDED FOR CHEST PAIN 25 tablet 6  . PARoxetine (PAXIL) 20 MG tablet Take 1 tablet (20 mg total) by mouth daily. 30 tablet 3  . spironolactone (ALDACTONE) 25  MG tablet Take 1 tablet (25 mg total) by mouth daily. 30 tablet 6  . triamcinolone cream (KENALOG) 0.1 % USE 1 APPLICATION TOPICALLY TO THE AFFECTED AREA TWICE A DAY AS NEEDED FOR ECZEMA  5  . sildenafil (REVATIO) 20 MG tablet Take 1 tablet (20 mg total) by mouth as needed. 10 tablet 0   No current facility-administered medications for this encounter.    BP (!) 117/55   Pulse 72   Ht 5\' 11"  (1.803 m)   Wt 214 lb 8 oz (97.3 kg)   SpO2 99%   BMI 29.92 kg/m    Wt Readings from Last 3 Encounters:  08/14/16 214 lb 8 oz (97.3 kg)  07/07/16 217 lb (98.4 kg)  06/22/16 218 lb 12.8 oz (99.2 kg)    General: NAD Neck: JVP not elevated. No thyromegaly or thyroid nodule.  Lungs: Clear bilaterally CV: Nondisplaced PMI.  Heart regular S1/S2, no S3/S4, 2/6 HSM apex.  No peripheral edema.  No carotid bruit.  Normal pedal pulses.  Abdomen: Soft, NT, ND, no HSM. No bruits or masses. +BS .  Skin: Intact without lesions or rashes.  Neurologic: Alert and oriented x 3.  Psych: Normal affect. Extremities: No clubbing or cyanosis.  HEENT: Normal.   Assessment/Plan: 1. Chronic systolic CHF: With low cardiac output noted on during admission in 10/17.  Ischemic cardiomyopathy, had upgrade to Medtronic BiV ICD in 10/17.  However, with VT storm in 12/17, the LV lead was turned off due to concerns for pro-arrhythmia.  In 1/18, he developed a device infection and the BiV-ICD was removed and later replaced with a Medtronic ICD with His bundle pacing.  RHC (1/18) with preserved cardiac output.  TEE (1/18) with EF 15%.  CPX in 5/18 with moderate HF limitation. Volume status stable by  exam but Optivol suggests recent volume overload. NYHA class III symptoms.     - Increase Lasix to 40 mg bid x 2 days then back to 40 mg daily.   - Did not tolerate Entresto due to orthostasis. Today will increase losartan to 25 qam/12.5 qpm.  BMET 10 days.   - Continue spironolactone 25 mg daily.   - Continue digoxin 0.125 mg every other day, check level today.  - Continue bisoprolol 2.5 qhs.  - Repeat echo in 8/18.  - Now that he is driving again, refer for cardiac rehab.  - May eventually need to consider for LVAD. Has 3 children and lives with sister.  Will follow his clinical trajectory closely. He quit smoking in 2/18.  Given his concerning clinical trajectory (though somewhat stabilized recently), will refer for transplant evaluation.      2.  CAD: No chest pain.  LHC in 12/17 with stable coronary disease.  - Continue ASA 81, Plavix, and statin.  3. Smoking: Remains abstinent since 2/18.  4. 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  Suspect this contributed to HF with loss of AV synchrony (and also was bradycardic).  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  He was BiV paced after 10/17 admission, but LV lead was turned off due to concern for pro-arrhythmia.  He now has a Medtronic ICD with His bundle pacing.  5. Ascending aortic aneurysm: 4.1 cm in 8/17. Needs repeat CT in 8/18.  6. Ventricular tachycardia: VT storm in 12/17.   - Continue amiodarone and mexiletine. No VT/VF by device interrogation today.  Check LFTs and TSH today with amiodarone use. He  will need to get a regular eye exam.  7. PTSD: Patient is on paroxetine due to significant anxiety around shocks.  He has a hard time sleeping.  - Continue prn xanax.  Followup in 1 month.  Marca Ancona, MD  08/16/2016

## 2016-08-17 ENCOUNTER — Telehealth (HOSPITAL_COMMUNITY): Payer: Self-pay

## 2016-08-17 ENCOUNTER — Telehealth (HOSPITAL_COMMUNITY): Payer: Self-pay | Admitting: *Deleted

## 2016-08-17 NOTE — Telephone Encounter (Signed)
Patient insurance is active and benefits verified. Patient has Medicaid - no co-payment, no deductible, no out of pocket, no co-insurance, no pre-authorization and no limit on visit. Passport/reference [phone]#[id number].

## 2016-08-17 NOTE — Telephone Encounter (Signed)
Patient records faxed to Dr. Edwena Blowevore at [facility] today @ [phone].  Referral packet filed.

## 2016-08-18 ENCOUNTER — Telehealth (HOSPITAL_COMMUNITY): Payer: Self-pay

## 2016-08-18 NOTE — Telephone Encounter (Signed)
Returned call to patient who left VM yesterday to return his call. Patient wants to know if he needs to be referred for an eye exam that [name] recommended while taking amiodarone. Advised to call insurance to verify if referral is needed or if certain criteria apply for coverage and to let us know.Ave FilterBradley, Megan Genevea, RN

## 2016-08-24 ENCOUNTER — Emergency Department (HOSPITAL_COMMUNITY)
Admission: EM | Admit: 2016-08-24 | Discharge: 2016-08-24 | Disposition: A | Discharge status: Home / Self Care | Payer: Medicaid Other | Attending: Emergency Medicine | Admitting: Emergency Medicine

## 2016-08-24 ENCOUNTER — Encounter (HOSPITAL_COMMUNITY): Payer: Self-pay | Admitting: Emergency Medicine

## 2016-08-24 ENCOUNTER — Inpatient Hospital Stay (HOSPITAL_COMMUNITY): Admission: RE | Admit: 2016-08-24 | Payer: Self-pay | Source: Ambulatory Visit

## 2016-08-24 ENCOUNTER — Emergency Department (HOSPITAL_COMMUNITY): Payer: Medicaid Other

## 2016-08-24 DIAGNOSIS — I2510 Atherosclerotic heart disease of native coronary artery without angina pectoris: Secondary | ICD-10-CM | POA: Diagnosis not present

## 2016-08-24 DIAGNOSIS — Z79899 Other long term (current) drug therapy: Secondary | ICD-10-CM | POA: Insufficient documentation

## 2016-08-24 DIAGNOSIS — I5033 Acute on chronic diastolic (congestive) heart failure: Secondary | ICD-10-CM | POA: Insufficient documentation

## 2016-08-24 DIAGNOSIS — Z87891 Personal history of nicotine dependence: Secondary | ICD-10-CM | POA: Diagnosis not present

## 2016-08-24 DIAGNOSIS — I10 Essential (primary) hypertension: Secondary | ICD-10-CM | POA: Diagnosis not present

## 2016-08-24 DIAGNOSIS — I509 Heart failure, unspecified: Secondary | ICD-10-CM

## 2016-08-24 DIAGNOSIS — I5023 Acute on chronic systolic (congestive) heart failure: Secondary | ICD-10-CM | POA: Diagnosis not present

## 2016-08-24 DIAGNOSIS — R0602 Shortness of breath: Secondary | ICD-10-CM | POA: Diagnosis present

## 2016-08-24 DIAGNOSIS — I441 Atrioventricular block, second degree: Secondary | ICD-10-CM | POA: Diagnosis not present

## 2016-08-24 LAB — BASIC METABOLIC PANEL
ANION GAP: 11 (ref 5–15)
BUN: 19 mg/dL (ref 6–20)
CALCIUM: 8.8 mg/dL — AB (ref 8.9–10.3)
CO2: 25 mmol/L (ref 22–32)
Chloride: 104 mmol/L (ref 101–111)
Creatinine, Ser: 1.41 mg/dL — ABNORMAL HIGH (ref 0.61–1.24)
GFR calc Af Amer: 60 mL/min — ABNORMAL LOW (ref >60)
GFR, EST NON AFRICAN AMERICAN: 52 mL/min — AB (ref >60)
GLUCOSE: 99 mg/dL (ref 65–99)
POTASSIUM: 4.2 mmol/L (ref 3.5–5.1)
SODIUM: 140 mmol/L (ref 135–145)

## 2016-08-24 LAB — CBC WITH DIFFERENTIAL/PLATELET
BASOS ABS: 0.1 10*3/uL (ref 0.0–0.1)
Basophils Relative: 1 %
EOS ABS: 0.1 10*3/uL (ref 0.0–0.7)
EOS PCT: 1 %
HCT: 38 % — ABNORMAL LOW (ref 39.0–52.0)
Hemoglobin: 12 g/dL — ABNORMAL LOW (ref 13.0–17.0)
Lymphocytes Relative: 12 %
Lymphs Abs: 1.2 10*3/uL (ref 0.7–4.0)
MCH: 30.9 pg (ref 26.0–34.0)
MCHC: 31.6 g/dL (ref 30.0–36.0)
MCV: 97.9 fL (ref 78.0–100.0)
MONO ABS: 0.6 10*3/uL (ref 0.1–1.0)
Monocytes Relative: 6 %
Neutro Abs: 8.3 10*3/uL — ABNORMAL HIGH (ref 1.7–7.7)
Neutrophils Relative %: 82 %
PLATELETS: 236 10*3/uL (ref 150–400)
RBC: 3.88 MIL/uL — AB (ref 4.22–5.81)
RDW: 16 % — AB (ref 11.5–15.5)
WBC: 10.1 10*3/uL (ref 4.0–10.5)

## 2016-08-24 LAB — DIGOXIN LEVEL

## 2016-08-24 LAB — BRAIN NATRIURETIC PEPTIDE: B NATRIURETIC PEPTIDE 5: 911.4 pg/mL — AB (ref 0.0–100.0)

## 2016-08-24 LAB — TROPONIN I

## 2016-08-24 MED ORDER — POTASSIUM CHLORIDE CRYS ER 20 MEQ PO TBCR
40.0000 meq | EXTENDED_RELEASE_TABLET | Freq: Once | ORAL | Status: AC
  Administered 2016-08-24: 40 meq via ORAL
  Filled 2016-08-24: qty 2

## 2016-08-24 MED ORDER — FUROSEMIDE 10 MG/ML IJ SOLN
80.0000 mg | Freq: Once | INTRAMUSCULAR | Status: AC
  Administered 2016-08-24: 80 mg via INTRAVENOUS
  Filled 2016-08-24: qty 8

## 2016-08-24 MED ORDER — METOLAZONE 2.5 MG PO TABS
2.5000 mg | ORAL_TABLET | Freq: Once | ORAL | Status: AC
  Administered 2016-08-24: 2.5 mg via ORAL
  Filled 2016-08-24: qty 1

## 2016-08-24 NOTE — ED Notes (Signed)
Cardiologists at bedside at this time.

## 2016-08-24 NOTE — ED Notes (Signed)
Gave pt pre-packed bag lunch and Sprite, per Kendal HymenBonnie - RN.

## 2016-08-24 NOTE — ED Notes (Signed)
Phlebotomy at bedside.

## 2016-08-24 NOTE — ED Notes (Signed)
Pt is in gown and on monitor  

## 2016-08-24 NOTE — ED Triage Notes (Signed)
Pjatient arrived to ED via [facility] from home. EMS reports: Patient became short of breath approx 0500 this morning. [facility] Wheezing. EMS called. EMS admninistered Albuterol 5 mg & Atrovent 0.5 mg neb treatment. Patient does not wear home oxygen. Denies chest pain. VSS. BP 133/71, Pulse 89, Pulse ox 95% on neb tx. 20 gauge in L AC.

## 2016-08-24 NOTE — ED Provider Notes (Signed)
[facility]-EMERGENCY DEPT Provider Note   CSN: [id number] Arrival date & time: 08/24/16  4098     History   Chief Complaint Chief Complaint  Patient presents with  . Shortness of Breath    HPI [name] is a 61 y.o. male.  HPI Patient presents with shortness of breath. Began acutely at 5:00 this morning. Has a history of CHF and ejection fraction around 15%. No chest pain. States it feels tight and feels as if his volume is up. No chest pain. No cough. Given albuterol via EMS that is improved a little bit. Mild swelling in his legs. States he did not check his weight yesterday or today but it has been hovering around 212 pounds.   Past Medical History:  Diagnosis Date  . Anxiety   . Automatic implantable cardioverter-defibrillator in situ    a. Medtronic ICD.  [name] CAD (coronary artery disease)    a.  Severe LAD stenosis 2/2 acute thrombus - BMS 2009;  b. 06/01/11 Cath - LAD 20 isr, 17m (3.0x16 Veri-flex BMS), OM1 100p, EF 20-25%;  c. 07/2012 Abnl Cardiolite;  d. 08/2012 Cath/PCI: LM nl, LAD patent stents, D2 80-90 (jailed), LCX 90 p/m (4.0x24 Promus Premier DES), OM1 100, OM2 80-90p (3.0x20 Promus Premier DES), RCA patent mid stent, 40d, EF 25%.  e. 10/29/15 LHC stable disease with patent stents  . Chronic systolic CHF (congestive heart failure), NYHA class 3 (HCC) - low output state 12/12/2014  . Depression   . Dyspnea   . History of rheumatic fever 1962  . HLD (hyperlipidemia) mixed  . HTN (hypertension)   . Ischemic cardiomyopathy    a.  EF 30-35% 2010;  b.  EF 20-25% by LV gram 08/2012. c. EF 15% by echo 11/2014.  [name] Myocardial infarction [facility]) 1998; 2002; ~ 2010  . Paroxysmal ventricular tachycardia (HCC)    a. VFlutter  CL 210 msec  Rx shock 04/2011  . PVC's (premature ventricular contractions)   . RBBB   . TIA (transient ischemic attack)    a. Dx 11/2014, continued on ASA/Plavix.    Patient Active Problem List   Diagnosis Date Noted  . ICD (implantable  cardioverter-defibrillator) infection (HCC)   . Palliative care encounter   . Bacteremia, coagulase-negative staphylococcal   . Pulmonary edema   . VF (ventricular fibrillation) (HCC) 01/27/2016  . Encounter for intubation   . DOE (dyspnea on exertion) 06/19/2015  . ICD (implantable cardioverter-defibrillator) in place   . H/O medication noncompliance   . Depression 04/09/2015  . Erectile dysfunction 04/09/2015  . RBBB   . PVC's (premature ventricular contractions)   . Chronic systolic CHF (congestive heart failure), NYHA class 3 (HCC) - low output state 12/12/2014  . Generalized anxiety disorder 12/09/2014  . Major depressive disorder, recurrent episode (HCC) 12/09/2014  . TIA (transient ischemic attack)   . HLD (hyperlipidemia)   . Aphasia 05/04/2014  . CAD (coronary artery disease)   . Ischemic cardiomyopathy   . Automatic implantable cardioverter-defibrillator in situ   . TOBACCO ABUSE 02/04/2010  . VENTRICULAR TACHYCARDIA 08/21/2008  . HYPERLIPIDEMIA-MIXED 06/04/2008  . Anxiety state 06/04/2008  . Essential hypertension 06/04/2008  . Cardiomyopathy, ischemic 06/04/2008  . CHEST PAIN-UNSPECIFIED 06/04/2008    Past Surgical History:  Procedure Laterality Date  . CARDIAC CATHETERIZATION N/A 10/29/2015   Procedure: Right/Left Heart Cath and Coronary Angiography;  Surgeon: Peter M Swaziland, MD;  Location: [facility] INVASIVE CV LAB;  Service: Cardiovascular;  Laterality: N/A;  . CARDIAC CATHETERIZATION N/A 03/20/2016  Procedure: Right Heart Cath;  Surgeon: Laurey Morale, MD;  Location: [facility] INVASIVE CV LAB;  Service: Cardiovascular;  Laterality: N/A;  . CARDIAC DEFIBRILLATOR PLACEMENT     MDT single chamber primary prevention ICD implanted by Dr Graciela Husbands as part of MASTER study  . EP IMPLANTABLE DEVICE N/A 12/11/2015   Procedure: BiV Upgrade;  Surgeon: [name], MD;  Location: [facility] INVASIVE CV LAB;  Service: Cardiovascular;  Laterality: N/A;  . EP IMPLANTABLE DEVICE N/A 12/11/2015    Procedure: Pocket Revision/Relocation;  Surgeon: [name], MD;  Location: [facility] INVASIVE CV LAB;  Service: Cardiovascular;  Laterality: N/A;  . EP IMPLANTABLE DEVICE N/A 12/11/2015   Procedure: Lead Revision/Repair;  Surgeon: [name], MD;  Location: [facility] INVASIVE CV LAB;  Service: Cardiovascular;  Laterality: N/A;  . EP IMPLANTABLE DEVICE N/A 03/23/2016   Procedure: Temporary placement of pacemaker lead and generator;  Surgeon: Marinus Maw, MD;  Location: [facility] OR;  Service: Cardiovascular;  Laterality: N/A;  . EP IMPLANTABLE DEVICE N/A 03/27/2016   Procedure: ICD Implant;  Surgeon: [name], MD;  Location: [facility] INVASIVE CV LAB;  Service: Cardiovascular;  Laterality: N/A;  . GENERATOR REMOVAL N/A 03/23/2016   Procedure: DEVICE REMOVAL;  Surgeon: Marinus Maw, MD;  Location: [facility] OR;  Service: Cardiovascular;  Laterality: N/A;  PVT to back up  . HERNIA REPAIR     "w/gallbladder OR" (08/29/2012)  . IR GENERIC HISTORICAL  03/31/2016   IR US GUIDE VASC ACCESS LEFT 03/31/2016 Simonne Come, MD [facility]-INTERV RAD  . IR GENERIC HISTORICAL  03/31/2016   IR FLUORO GUIDE CV MIDLINE PICC LEFT 03/31/2016 Simonne Come, MD [facility]INTER[facility]  . LAPAROSCOPIC CHOLECYSTECTOMY    . LEFT HEART CATHETERIZATION WITH CORONARY ANGIOGRAM N/A 06/08/2011   Procedure: LEFT HEART CATHETERIZATION WITH CORONARY ANGIOGRAM;  Surgeon: Dolores Patty, MD;  Location: [facility] CATH LAB;  Service: Cardiovascular;  Laterality: N/A;  . PERCUTANEOUS CORONARY STENT INTERVENTION (PCI-S) N/A 06/01/2011   Procedure: PERCUTANEOUS CORONARY STENT INTERVENTION (PCI-S);  Surgeon: Peter M Swaziland, MD;  Location: [facility] CATH LAB;  Service: Cardiovascular;  Laterality: N/A;  . PERCUTANEOUS CORONARY STENT INTERVENTION (PCI-S) N/A 08/29/2012   Procedure: PERCUTANEOUS CORONARY STENT INTERVENTION (PCI-S);  Surgeon: Peter M Swaziland, MD;  Location: [facility] CATH LAB;  Service: Cardiovascular;  Laterality: N/A;  . PERIPHERAL VASCULAR CATHETERIZATION  12/11/2015   Procedure: Upper  Extremity Venography;  Surgeon: [name], MD;  Location: [facility] INVASIVE CV LAB;  Service: Cardiovascular;;  . TEE WITHOUT CARDIOVERSION N/A 03/20/2016   Procedure: TRANSESOPHAGEAL ECHOCARDIOGRAM (TEE);  Surgeon: Vesta Mixer, MD;  Location: [facility] ENDOSCOPY;  Service: Cardiovascular;  Laterality: N/A;  . TEE WITHOUT CARDIOVERSION N/A 03/23/2016   Procedure: TRANSESOPHAGEAL ECHOCARDIOGRAM (TEE);  Surgeon: Marinus Maw, MD;  Location: [facility] OR;  Service: Cardiovascular;  Laterality: N/A;       Home Medications    Prior to Admission medications   Medication Sig Start Date End Date Taking? Authorizing Provider  ALPRAZolam (XANAX) 0.25 MG tablet Take 1 tablet (0.25 mg total) by mouth at bedtime as needed for anxiety. F/u with PCP for refills. 02/21/16  Yes Laurey Morale, MD  amiodarone (PACERONE) 200 MG tablet TAKE 1 TABLET BY MOUTH EVERY DAY 08/18/16  Yes Laurey Morale, MD  aspirin EC 81 MG tablet Take 81 mg by mouth daily.   Yes [provider]  atorvastatin (LIPITOR) 40 MG tablet Take 1 tablet (40 mg total) by mouth daily. 03/03/16  Yes [name],  MD  bisoprolol (ZEBETA) 5 MG tablet Take 0.5 tablets (2.5 mg total) by mouth at bedtime. 02/13/16  Yes Duke SalviaKlein, Steven C, MD  clopidogrel (PLAVIX) 75 MG tablet Take 1 tablet (75 mg total) by mouth daily. 03/03/16  Yes Lewayne Buntingrenshaw, Brian S, MD  digoxin (LANOXIN) 0.125 MG tablet Take one tablet (0.125 mg) by mouth once every other day 02/11/16  Yes Duke SalviaKlein, Steven C, MD  famotidine (PEPCID) 20 MG tablet Take 1 tablet (20 mg total) by mouth 2 (two) times daily. 02/02/16  Yes Bhagat, Bhavinkumar, PA  furosemide (LASIX) 40 MG tablet Take 1 tablet (40 mg total) by mouth daily. 08/14/16  Yes Laurey MoraleMcLean, Dalton S, MD  hydrOXYzine (VISTARIL) 25 MG capsule Take 25 mg by mouth at bedtime as needed (sleep).   Yes [provider]  losartan (COZAAR) 25 MG tablet Take 25 mg (1 Tablet) in the AM, and 12.5 mg (0.5 Tablet) in the PM. Patient taking  differently: Take 12.5-25 mg by mouth See admin instructions. Take 12.5 mg (0.5 Tablet) in the AM, and 25 mg (1 Tablet) in the PM. 08/14/16  Yes Laurey MoraleMcLean, Dalton S, MD  magnesium oxide (MAG-OX) 400 (241.3 Mg) MG tablet Take 1 tablet (400 mg total) by mouth daily. 02/02/16  Yes Bhagat, Bhavinkumar, PA  mexiletine (MEXITIL) 200 MG capsule TAKE ONE CAPSULE BY MOUTH EVERY 12 HOURS 06/19/16  Yes Allred, Fayrene FearingJames, MD  PARoxetine (PAXIL) 20 MG tablet Take 1 tablet (20 mg total) by mouth daily. 07/07/16  Yes Duke SalviaKlein, Steven C, MD  spironolactone (ALDACTONE) 25 MG tablet Take 1 tablet (25 mg total) by mouth daily. 04/13/16  Yes Graciella Freerillery, Michael Andrew, PA-C  triamcinolone cream (KENALOG) 0.1 % USE 1 APPLICATION TOPICALLY TO THE AFFECTED AREA TWICE A DAY AS NEEDED FOR ECZEMA 05/27/16  Yes [provider]  NITROSTAT 0.4 MG SL tablet PLACE 1 TABLET (0.4 MG TOTAL) UNDER THE TONGUE EVERY 5 (FIVE) MINUTES AS NEEDED FOR CHEST PAIN 04/23/16   Duke SalviaKlein, Steven C, MD  sildenafil (REVATIO) 20 MG tablet Take 1 tablet (20 mg total) by mouth as needed. 08/14/16   Laurey MoraleMcLean, Dalton S, MD    Family History Family History  Problem Relation Age of Onset  . Cancer Father   . Cancer Sister        twin sister and only one has cancer, unknown what kind  . Hypertension Brother   . Cancer Unknown        family hx  . Heart attack Neg Hx   . Stroke Neg Hx     Social History Social History  Substance Use Topics  . Smoking status: Former Smoker    Packs/day: 1.00    Years: 47.00    Types: Cigarettes    Quit date: 11/20/2015  . Smokeless tobacco: Never Used  . Alcohol use 3.6 oz/week    6 Cans of beer per week     Allergies   Patient has no known allergies.   Review of Systems Review of Systems  Constitutional: Negative for appetite change and fever.  HENT: Negative for congestion.   Respiratory: Positive for shortness of breath.   Cardiovascular: Negative for chest pain and leg swelling.  Gastrointestinal: Negative for  abdominal pain.  Genitourinary: Negative for flank pain.  Musculoskeletal: Negative for back pain.  Neurological: Negative for numbness.  Hematological: Negative for adenopathy.     Physical Exam Updated Vital Signs BP (!) 131/96   Pulse 71   Temp 97.7 F (36.5 C) (Oral)   Resp [name](!)  21   Ht 5\' 11"  (1.803 m)   Wt 97.5 kg (215 lb)   SpO2 97%   BMI 29.99 kg/m   Physical Exam  Constitutional: He appears well-developed.  HENT:  Head: Atraumatic.  Eyes: EOM are normal.  Neck: No JVD present.  Cardiovascular: Normal rate.   Pulmonary/Chest: He has rales.  Rales to bilateral bases.  Abdominal: There is no guarding.  Musculoskeletal: He exhibits edema.  Mild edema to bilateral lower extremities.  Neurological: He is alert.  Skin: Skin is warm. Capillary refill takes less than 2 seconds.  Psychiatric: He has a normal mood and affect.     ED Treatments / Results  Labs (all labs ordered are listed, but only abnormal results are displayed) Labs Reviewed  BRAIN NATRIURETIC PEPTIDE - Abnormal; Notable for the following:       Result Value   B Natriuretic Peptide 911.4 (*)    All other components within normal limits  CBC WITH DIFFERENTIAL/PLATELET - Abnormal; Notable for the following:    RBC 3.88 (*)    Hemoglobin 12.0 (*)    HCT 38.0 (*)    RDW 16.0 (*)    Neutro Abs 8.3 (*)    All other components within normal limits  BASIC METABOLIC PANEL - Abnormal; Notable for the following:    Creatinine, Ser 1.41 (*)    Calcium 8.8 (*)    GFR calc non Af Amer 52 (*)    GFR calc Af Amer 60 (*)    All other components within normal limits  DIGOXIN LEVEL - Abnormal; Notable for the following:    Digoxin Level <0.2 (*)    All other components within normal limits  TROPONIN I    EKG  EKG Interpretation  Date/Time:  Monday August 24 2016 07:27:31 EDT Ventricular Rate:  79 PR Interval:    QRS Duration: 200 QT Interval:  466 QTC Calculation: 538 R Axis:   -72 Text  Interpretation:  ATRIAL SENSING VENTRICULAR PACED RHYTHM When compared with ECG of 05/20/2016, No significant change was found Confirmed by Dione Booze ([id number]) on 08/24/2016 7:32:35 AM       Radiology Dg Chest 2 View  Result Date: 08/24/2016 CLINICAL DATA:  Onset of shortness of breath at 4:30 a.m. today. History of CHF, previous MI, former smoker. EXAM: CHEST  2 VIEW COMPARISON:  Chest x-ray of March 28, 2016 FINDINGS: The lungs are well-expanded. The pulmonary vascularity is engorged and the interstitial markings are increased. The heart is top-normal in size but stable. There is no pleural effusion. There is no pneumothorax or pneumomediastinum. The ICD is in stable position. This calcification in the wall of the aortic arch. IMPRESSION: CHF with mild pulmonary interstitial edema.  No acute pneumonia. Electronically Signed   By: David  Swaziland M.D.   On: 08/24/2016 08:58    Procedures Procedures (including critical care time)  Medications Ordered in ED Medications  furosemide (LASIX) injection 80 mg (80 mg Intravenous Given 08/24/16 1233)  metolazone (ZAROXOLYN) tablet 2.5 mg (2.5 mg Oral Given 08/24/16 1226)  potassium chloride SA (K-DUR,KLOR-CON) CR tablet 40 mEq (40 mEq Oral Given 08/24/16 1233)     Initial Impression / Assessment and Plan / ED Course  I have reviewed the triage vital signs and the nursing notes.  Pertinent labs & imaging results that were available during my care of the patient were reviewed by me and considered in my medical decision making (see chart for details).     Patient was shortness  of breath. History of CHF. Appears to final load. Seen by heart failure in the ER. Given medicines. Has diuresed over a liter and will be discharged home. Patient feels better.  Final Clinical Impressions(s) / ED Diagnoses   Final diagnoses:  Acute on chronic congestive heart failure, unspecified heart failure type [facility])    New Prescriptions Discharge Medication List as of  08/24/2016  3:06 PM       Benjiman Core, MD 08/24/16 [phone]

## 2016-08-24 NOTE — Consult Note (Addendum)
Advanced Heart Failure Team Consult Note  Primary Cardiologist:  Dr. Shirlee Latch   Reason for Consultation/Admission: Acute on chronic systolic CHF  HPI:    [name] is seen today for evaluation of acute SOB at the request of Dr. Janae Sauce is a 61 y.o. male with h/o of tobacco abuse. CAD s/p multiple PCIs, and chronic systolic CHF due to ICM s/p CRT-D upgrade of Medtronic device due to 2nd degree AV block now with His Bundle pacing.    Last seen in HF clinic 08/14/16. Was thought to be stable on exam but optivol suggested mild volume overload. Lasix increased for 2 days. Losartan increased as well.   Pt presented to [facility] with acute SOB this am. Denies overt chest pain but does describe tightness/pressure when his volume status is elevated. Mild improvement with albuterol. Reports mild peripheral edema. Weight at home yesterday 212 lbs. Pertinent labs on admission include K 4.2, Cr 1.41, WBC 10.1, Hgb 12.0, BNP 911. Troponin negative.   Pt states he woke up like normal around 0530 and was much more SOB doing his daily activities. Did not improve throughout the morning so called EMS. Denies PND. SOB did not awaken him from sleep. Pt took his lasix this am, but no other meds. Otherwise compliant with medications. He states he drank "a lot" of water over the weekend at home.  Weight at home usually between 212-216. Denies recent illness or sick contact.  CXR with mild pulmonary interstitial edema. No acute PNA.   Optivol interrogation pending.   Review of Systems: [y] = yes, [ ]  = no   General: Weight gain [y]; Weight loss [ ] ; Anorexia [ ] ; Fatigue [ ] ; Fever [ ] ; Chills [ ] ; Weakness [ ]   Cardiac: Chest pain/pressure [ ] ; Resting SOB [y]; Exertional SOB [y]; Orthopnea [ ] ; Pedal Edema [y]; Palpitations [ ] ; Syncope [ ] ; Presyncope [ ] ; Paroxysmal nocturnal dyspnea[ ]   Pulmonary: Cough [ ] ; Wheezing[ ] ; Hemoptysis[ ] ; Sputum [ ] ; Snoring [ ]   GI: Vomiting[ ] ; Dysphagia[ ] ;  Melena[ ] ; Hematochezia [ ] ; Heartburn[ ] ; Abdominal pain [ ] ; Constipation [ ] ; Diarrhea [ ] ; BRBPR [ ]   GU: Hematuria[ ] ; Dysuria [ ] ; Nocturia[ ]   Vascular: Pain in legs with walking [ ] ; Pain in feet with lying flat [ ] ; Non-healing sores [ ] ; Stroke [ ] ; TIA [ ] ; Slurred speech [ ] ;  Neuro: Headaches[ ] ; Vertigo[ ] ; Seizures[ ] ; Paresthesias[ ] ;Blurred vision [ ] ; Diplopia [ ] ; Vision changes [ ]   Ortho/Skin: Arthritis [y]; Joint pain [y]; Muscle pain [ ] ; Joint swelling [ ] ; Back Pain [ ] ; Rash [ ]   Psych: Depression[ ] ; Anxiety[ ]   Heme: Bleeding problems [ ] ; Clotting disorders [ ] ; Anemia [ ]   Endocrine: Diabetes [ ] ; Thyroid dysfunction[ ]   Home Medications Prior to Admission medications   Medication Sig Start Date End Date Taking? Authorizing Provider  ALPRAZolam (XANAX) 0.25 MG tablet Take 1 tablet (0.25 mg total) by mouth at bedtime as needed for anxiety. F/u with PCP for refills. 02/21/16   Laurey Morale, MD  amiodarone (PACERONE) 200 MG tablet TAKE 1 TABLET BY MOUTH EVERY DAY 08/18/16   Laurey Morale, MD  aspirin EC 81 MG tablet Take 81 mg by mouth daily.    [provider]  atorvastatin (LIPITOR) 40 MG tablet Take 1 tablet (40 mg total) by mouth daily. 03/03/16   [name], MD  bisoprolol (ZEBETA)  5 MG tablet Take 0.5 tablets (2.5 mg total) by mouth at bedtime. 02/13/16   [name], MD  clopidogrel (PLAVIX) 75 MG tablet Take 1 tablet (75 mg total) by mouth daily. 03/03/16   [name], MD  digoxin (LANOXIN) 0.125 MG tablet Take one tablet (0.125 mg) by mouth once every other day 02/11/16   [name], MD  famotidine (PEPCID) 20 MG tablet Take 1 tablet (20 mg total) by mouth 2 (two) times daily. 02/02/16   Bhagat, Sharrell Ku, PA  furosemide (LASIX) 40 MG tablet Take 1 tablet (40 mg total) by mouth daily. 08/14/16   Laurey Morale, MD  hydrOXYzine (VISTARIL) 25 MG capsule Take 25 mg by mouth at bedtime as needed (sleep).    [provider]  losartan (COZAAR) 25 MG tablet Take 25 mg (1 Tablet) in the AM, and 12.5 mg (0.5 Tablet) in the PM. 08/14/16   Laurey Morale, MD  magnesium oxide (MAG-OX) 400 (241.3 Mg) MG tablet Take 1 tablet (400 mg total) by mouth daily. 02/02/16   Bhagat, Sharrell Ku, PA  mexiletine (MEXITIL) 200 MG capsule TAKE ONE CAPSULE BY MOUTH EVERY 12 HOURS 06/19/16   Allred, Fayrene Fearing, MD  NITROSTAT 0.4 MG SL tablet PLACE 1 TABLET (0.4 MG TOTAL) UNDER THE TONGUE EVERY 5 (FIVE) MINUTES AS NEEDED FOR CHEST PAIN 04/23/16   [name], MD  PARoxetine (PAXIL) 20 MG tablet Take 1 tablet (20 mg total) by mouth daily. 07/07/16   [name], MD  sildenafil (REVATIO) 20 MG tablet Take 1 tablet (20 mg total) by mouth as needed. 08/14/16   Laurey Morale, MD  spironolactone (ALDACTONE) 25 MG tablet Take 1 tablet (25 mg total) by mouth daily. 04/13/16   Graciella Freer, PA-C  triamcinolone cream (KENALOG) 0.1 % USE 1 APPLICATION TOPICALLY TO THE AFFECTED AREA TWICE A DAY AS NEEDED FOR ECZEMA 05/27/16   [provider]    Past Medical History: Past Medical History:  Diagnosis Date  . Anxiety   . Automatic implantable cardioverter-defibrillator in situ    a. Medtronic ICD.  [name] CAD (coronary artery disease)    a.  Severe LAD stenosis 2/2 acute thrombus - BMS 2009;  b. 06/01/11 Cath - LAD 20 isr, 56m (3.0x16 Veri-flex BMS), OM1 100p, EF 20-25%;  c. 07/2012 Abnl Cardiolite;  d. 08/2012 Cath/PCI: LM nl, LAD patent stents, D2 80-90 (jailed), LCX 90 p/m (4.0x24 Promus Premier DES), OM1 100, OM2 80-90p (3.0x20 Promus Premier DES), RCA patent mid stent, 40d, EF 25%.  e. 10/29/15 LHC stable disease with patent stents  . Chronic systolic CHF (congestive heart failure), NYHA class 3 (HCC) - low output state 12/12/2014  . Depression   . Dyspnea   . History of rheumatic fever 1962  . HLD (hyperlipidemia) mixed  . HTN (hypertension)   . Ischemic cardiomyopathy    a.  EF 30-35% 2010;  b.  EF 20-25% by LV gram  08/2012. c. EF 15% by echo 11/2014.  [name] Myocardial infarction [facility]) 1998; 2002; ~ 2010  . Paroxysmal ventricular tachycardia (HCC)    a. VFlutter  CL 210 msec  Rx shock 04/2011  . PVC's (premature ventricular contractions)   . RBBB   . TIA (transient ischemic attack)    a. Dx 11/2014, continued on ASA/Plavix.    Past Surgical History: Past Surgical History:  Procedure Laterality Date  . CARDIAC CATHETERIZATION N/A 10/29/2015   Procedure: Right/Left Heart Cath and Coronary Angiography;  Surgeon:  Peter M Swaziland, MD;  Location: [facility] INVASIVE CV LAB;  Service: Cardiovascular;  Laterality: N/A;  . CARDIAC CATHETERIZATION N/A 03/20/2016   Procedure: Right Heart Cath;  Surgeon: Laurey Morale, MD;  Location: [facility] INVASIVE CV LAB;  Service: Cardiovascular;  Laterality: N/A;  . CARDIAC DEFIBRILLATOR PLACEMENT     MDT single chamber primary prevention ICD implanted by Dr Graciela Husbands as part of MASTER study  . EP IMPLANTABLE DEVICE N/A 12/11/2015   Procedure: BiV Upgrade;  Surgeon: [name], MD;  Location: [facility] INVASIVE CV LAB;  Service: Cardiovascular;  Laterality: N/A;  . EP IMPLANTABLE DEVICE N/A 12/11/2015   Procedure: Pocket Revision/Relocation;  Surgeon: [name], MD;  Location: [facility] INVASIVE CV LAB;  Service: Cardiovascular;  Laterality: N/A;  . EP IMPLANTABLE DEVICE N/A 12/11/2015   Procedure: Lead Revision/Repair;  Surgeon: [name], MD;  Location: [facility] INVASIVE CV LAB;  Service: Cardiovascular;  Laterality: N/A;  . EP IMPLANTABLE DEVICE N/A 03/23/2016   Procedure: Temporary placement of pacemaker lead and generator;  Surgeon: Marinus Maw, MD;  Location: [facility] OR;  Service: Cardiovascular;  Laterality: N/A;  . EP IMPLANTABLE DEVICE N/A 03/27/2016   Procedure: ICD Implant;  Surgeon: [name], MD;  Location: [facility] INVASIVE CV LAB;  Service: Cardiovascular;  Laterality: N/A;  . GENERATOR REMOVAL N/A 03/23/2016   Procedure: DEVICE REMOVAL;  Surgeon: Marinus Maw, MD;  Location: [facility] OR;  Service:  Cardiovascular;  Laterality: N/A;  PVT to back up  . HERNIA REPAIR     "w/gallbladder OR" (08/29/2012)  . IR GENERIC HISTORICAL  03/31/2016   IR US GUIDE VASC ACCESS LEFT 03/31/2016 Simonne Come, MD [facility]-INTERV RAD  . IR GENERIC HISTORICAL  03/31/2016   IR FLUORO GUIDE CV MIDLINE PICC LEFT 03/31/2016 Simonne Come, MD [facility]INTER[facility]  . LAPAROSCOPIC CHOLECYSTECTOMY    . LEFT HEART CATHETERIZATION WITH CORONARY ANGIOGRAM N/A 06/08/2011   Procedure: LEFT HEART CATHETERIZATION WITH CORONARY ANGIOGRAM;  Surgeon: Dolores Patty, MD;  Location: [facility] CATH LAB;  Service: Cardiovascular;  Laterality: N/A;  . PERCUTANEOUS CORONARY STENT INTERVENTION (PCI-S) N/A 06/01/2011   Procedure: PERCUTANEOUS CORONARY STENT INTERVENTION (PCI-S);  Surgeon: Peter M Swaziland, MD;  Location: [facility] CATH LAB;  Service: Cardiovascular;  Laterality: N/A;  . PERCUTANEOUS CORONARY STENT INTERVENTION (PCI-S) N/A 08/29/2012   Procedure: PERCUTANEOUS CORONARY STENT INTERVENTION (PCI-S);  Surgeon: Peter M Swaziland, MD;  Location: [facility] CATH LAB;  Service: Cardiovascular;  Laterality: N/A;  . PERIPHERAL VASCULAR CATHETERIZATION  12/11/2015   Procedure: Upper Extremity Venography;  Surgeon: [name], MD;  Location: [facility] INVASIVE CV LAB;  Service: Cardiovascular;;  . TEE WITHOUT CARDIOVERSION N/A 03/20/2016   Procedure: TRANSESOPHAGEAL ECHOCARDIOGRAM (TEE);  Surgeon: Vesta Mixer, MD;  Location: [facility] ENDOSCOPY;  Service: Cardiovascular;  Laterality: N/A;  . TEE WITHOUT CARDIOVERSION N/A 03/23/2016   Procedure: TRANSESOPHAGEAL ECHOCARDIOGRAM (TEE);  Surgeon: Marinus Maw, MD;  Location: [facility] OR;  Service: Cardiovascular;  Laterality: N/A;    Family History: Family History  Problem Relation Age of Onset  . Cancer Father   . Cancer Sister        twin sister and only one has cancer, unknown what kind  . Hypertension Brother   . Cancer Unknown        family hx  . Heart attack Neg Hx   . Stroke Neg Hx     Social History: Social History   Social  History  . Marital status: Legally Separated    Spouse name: N/A  .  Number of children: N/A  . Years of education: N/A   Social History Main Topics  . Smoking status: Former Smoker    Packs/day: 1.00    Years: 47.00    Types: Cigarettes    Quit date: 11/20/2015  . Smokeless tobacco: Never Used  . Alcohol use 3.6 oz/week    6 Cans of beer per week  . Drug use: No  . Sexual activity: Not Currently   Other Topics Concern  . None   Social History Narrative   Married; full time.     Allergies:  No Known Allergies  Objective:    Vital Signs:   Temp:  [97.7 F (36.5 C)] 97.7 F (36.5 C) (07/02 0740) Pulse Rate:  [68-80] 74 (07/02 1015) Resp:  [18-26] 20 (07/02 1015) BP: (103-117)/(57-75) 113/69 (07/02 1015) SpO2:  [92 %-100 %] 96 % (07/02 1015) Weight:  [215 lb (97.5 kg)] 215 lb (97.5 kg) (07/02 0733)    Weight change: Filed Weights   08/24/16 0733  Weight: 215 lb (97.5 kg)    Intake/Output:  No intake or output data in the 24 hours ending 08/24/16 1128    Physical Exam    General:  Well appearing. No resp difficulty HEENT: normal Neck: supple. JVP elevated ~10 cm. Carotids 2+ bilat; no bruits. No lymphadenopathy or thyromegaly appreciated. Cor: PMI nondisplaced. Regular rate & rhythm. 2/6 HSM apex. R sided ICD.  Lungs: clear Abdomen: soft, nontender, nondistended. No hepatosplenomegaly. No bruits or masses. Good bowel sounds. Extremities: no cyanosis, clubbing, rash, edema Neuro: alert & orientedx3, cranial nerves grossly intact. moves all 4 extremities w/o difficulty. Affect pleasant  Telemetry   Personally reviewed, A sensed V paced 70-80s  EKG    From this am personally reviewed. A sensed V paced 79 bpm  Labs   Basic Metabolic Panel:  Recent Labs Lab 08/24/16 0916  NA 140  K 4.2  CL 104  CO2 25  GLUCOSE 99  BUN 19  CREATININE 1.41*  CALCIUM 8.8*    Liver Function Tests: No results for input(s): AST, ALT, ALKPHOS, BILITOT, PROT,  ALBUMIN in the last 168 hours. No results for input(s): LIPASE, AMYLASE in the last 168 hours. No results for input(s): AMMONIA in the last 168 hours.  CBC:  Recent Labs Lab 08/24/16 0916  WBC 10.1  NEUTROABS 8.3*  HGB 12.0*  HCT 38.0*  MCV 97.9  PLT 236    Cardiac Enzymes: No results for input(s): CKTOTAL, CKMB, CKMBINDEX, TROPONINI in the last 168 hours.  BNP: BNP (last 3 results)  Recent Labs  05/20/16 1105 06/22/16 1044 08/24/16 0916  BNP 793.4* 509.8* 911.4*    ProBNP (last 3 results) No results for input(s): PROBNP in the last 8760 hours.   CBG: No results for input(s): GLUCAP in the last 168 hours.  Coagulation Studies: No results for input(s): LABPROT, INR in the last 72 hours.   Imaging   Dg Chest 2 View  Result Date: 08/24/2016 CLINICAL DATA:  Onset of shortness of breath at 4:30 a.m. today. History of CHF, previous MI, former smoker. EXAM: CHEST  2 VIEW COMPARISON:  Chest x-ray of March 28, 2016 FINDINGS: The lungs are well-expanded. The pulmonary vascularity is engorged and the interstitial markings are increased. The heart is top-normal in size but stable. There is no pleural effusion. There is no pneumothorax or pneumomediastinum. The ICD is in stable position. This calcification in the wall of the aortic arch. IMPRESSION: CHF with mild pulmonary interstitial edema.  No  acute pneumonia. Electronically Signed   By: David  SwazilandJordan M.D.   On: 08/24/2016 08:58     Medications:     Current Facility-Administered Medications:  .  furosemide (LASIX) injection 80 mg, 80 mg, Intravenous, Once, Graciella Freerillery, Michael Andrew, PA-C  Current Outpatient Prescriptions:  .  ALPRAZolam (XANAX) 0.25 MG tablet, Take 1 tablet (0.25 mg total) by mouth at bedtime as needed for anxiety. F/u with PCP for refills., Disp: 30 tablet, Rfl: 0 .  amiodarone (PACERONE) 200 MG tablet, TAKE 1 TABLET BY MOUTH EVERY DAY, Disp: 90 tablet, Rfl: 3 .  aspirin EC 81 MG tablet, Take 81 mg by  mouth daily., Disp: , Rfl:  .  atorvastatin (LIPITOR) 40 MG tablet, Take 1 tablet (40 mg total) by mouth daily., Disp: 30 tablet, Rfl: 5 .  bisoprolol (ZEBETA) 5 MG tablet, Take 0.5 tablets (2.5 mg total) by mouth at bedtime., Disp: 15 tablet, Rfl: 11 .  clopidogrel (PLAVIX) 75 MG tablet, Take 1 tablet (75 mg total) by mouth daily., Disp: 30 tablet, Rfl: 5 .  digoxin (LANOXIN) 0.125 MG tablet, Take one tablet (0.125 mg) by mouth once every other day, Disp: 30 tablet, Rfl: 3 .  famotidine (PEPCID) 20 MG tablet, Take 1 tablet (20 mg total) by mouth 2 (two) times daily., Disp: 60 tablet, Rfl: 6 .  furosemide (LASIX) 40 MG tablet, Take 1 tablet (40 mg total) by mouth daily., Disp: 32 tablet, Rfl: 3 .  hydrOXYzine (VISTARIL) 25 MG capsule, Take 25 mg by mouth at bedtime as needed (sleep)., Disp: , Rfl:  .  losartan (COZAAR) 25 MG tablet, Take 25 mg (1 Tablet) in the AM, and 12.5 mg (0.5 Tablet) in the PM. (Patient taking differently: Take 12.5-25 mg by mouth See admin instructions. Take 12.5 mg (0.5 Tablet) in the AM, and 25 mg (1 Tablet) in the PM.), Disp: 45 tablet, Rfl: 3 .  magnesium oxide (MAG-OX) 400 (241.3 Mg) MG tablet, Take 1 tablet (400 mg total) by mouth daily., Disp: 30 tablet, Rfl: 6 .  mexiletine (MEXITIL) 200 MG capsule, TAKE ONE CAPSULE BY MOUTH EVERY 12 HOURS, Disp: 60 capsule, Rfl: 3 .  PARoxetine (PAXIL) 20 MG tablet, Take 1 tablet (20 mg total) by mouth daily., Disp: 30 tablet, Rfl: 3 .  spironolactone (ALDACTONE) 25 MG tablet, Take 1 tablet (25 mg total) by mouth daily., Disp: 30 tablet, Rfl: 6 .  triamcinolone cream (KENALOG) 0.1 %, USE 1 APPLICATION TOPICALLY TO THE AFFECTED AREA TWICE A DAY AS NEEDED FOR ECZEMA, Disp: , Rfl: 5 .  NITROSTAT 0.4 MG SL tablet, PLACE 1 TABLET (0.4 MG TOTAL) UNDER THE TONGUE EVERY 5 (FIVE) MINUTES AS NEEDED FOR CHEST PAIN, Disp: 25 tablet, Rfl: 6 .  sildenafil (REVATIO) 20 MG tablet, Take 1 tablet (20 mg total) by mouth as needed., Disp: 10 tablet, Rfl:  0   Patient Profile   [name] is a 61 y.o. male with h/o of tobacco abuse. CAD s/p multiple PCIs, and chronic systolic CHF due to ICM s/p CRT-D upgrade of Medtronic device due to 2nd degree AV block now with His Bundle pacing.  [facility] with acute SOB starting that morning ~ 0530.  Assessment/Plan   1. Acute on chronic systolic CHF - RHC 02/2016 with preserved cardiac output despite TEE 02/2016 with EF 15% - CPX 06/2016 with moderate HF limitation - Volume status elevated on exam.  - NYHA class IIIb this am, but usually NYHA III at baseline.  -  Modest, clear urine output at bedside with only his po dose of diuretics earlier this am.  - Give 80 mg IV lasix, 2.5 mg metolazone, and 40 meq of K now.  Volume only mildly up, so will watch for output and consider sending home today.  - Reinforced fluid restriction to < 2 L daily, sodium restriction to less than 2000 mg daily, and the importance of daily weights.   2. CAD - Stable coronaries cath 01/2016 - Denies chest pain.  3. Tobacco abuse - Stopped 03/2016 4. Rhythm - s/p CRT-D with his bundle pacing due to 2nd degree AV block and 2:1 AVB in the past. 5. Ascending aortic aneurysm:  - 4.1 cm in 8.2017. Needs repeat CT 09/2016 6. VT  - H/o of VT storm in 12/17 - Optivol interrogation pending.  7. PTSD - Due to repetitive shocks 2/2 #6.  - Continue prn xanax and daily paroxetine.   Suspect volume overloaded in setting of fluid restriction non-compliance over the weekend. Discussed with MD and will give IV lasix + metolazone in ED with close follow up next week. As long as patient has > 1 L UO in next few hours, would send home.   Length of Stay: 0  Luane School  08/24/2016, 11:28 AM  Advanced Heart Failure Team Pager [phone] (M-F; 7a - 4p)  Please contact [facility] for night-coverage after hours (4p -7a ) and weekends on [email or web address]  Patient seen and examined with the above-signed Advanced  Practice Provider and/or Housestaff. I personally reviewed laboratory data, imaging studies and relevant notes. I independently examined the patient and formulated the important aspects of the plan. I have edited the note to reflect any of my changes or salient points. I have personally discussed the plan with the patient and/or family.  Volume status mildly elevated. Respiratory status stable. Will give one dose IV lasix and metolazone 2.5 and kcl 40. If has 1L of urine out can go home with outpatient f/u. Reinforced need for daily weights and reviewed use of sliding scale diuretics. D/w Dr. Rubin Payor.   Arvilla Meres, MD  12:56 PM

## 2016-08-31 ENCOUNTER — Other Ambulatory Visit: Payer: Self-pay | Admitting: Internal Medicine

## 2016-09-02 ENCOUNTER — Encounter (HOSPITAL_COMMUNITY): Payer: Self-pay

## 2016-09-07 ENCOUNTER — Telehealth: Payer: Self-pay | Admitting: Cardiology

## 2016-09-07 ENCOUNTER — Ambulatory Visit (INDEPENDENT_AMBULATORY_CARE_PROVIDER_SITE_OTHER): Payer: Medicaid Other

## 2016-09-07 DIAGNOSIS — I5022 Chronic systolic (congestive) heart failure: Secondary | ICD-10-CM | POA: Diagnosis not present

## 2016-09-07 DIAGNOSIS — Z95810 Presence of automatic (implantable) cardiac defibrillator: Secondary | ICD-10-CM | POA: Diagnosis not present

## 2016-09-07 NOTE — Telephone Encounter (Signed)
Spoke with pt and reminded pt of remote transmission that is due today. Pt verbalized understanding.   

## 2016-09-08 ENCOUNTER — Telehealth (HOSPITAL_COMMUNITY): Payer: Self-pay | Admitting: Pharmacist

## 2016-09-08 NOTE — Progress Notes (Signed)
EPIC Encounter for ICM Monitoring  Patient Name: [name] is a 61 y.o. male Date: 09/08/2016 Primary Care Physican: Cyndi Bender, PA-C Primary Cardiologist: Crenshaw/McLean Electrophysiologist: Caryl Comes Dry Weight:217lbs  Bi-V Pacing: 99.9%      Heart Failure questions reviewed, pt symptomatic with shortness of breath. Stomach bloating, congestion, weight gain of a minimum of 6 pounds in last 5 days.   Thoracic impedance abnormal suggesting fluid accumulation.  Prescribed dosage: Furosemide 40 mg 1 tablet daily   Labs: 08/24/2016 Creatinine 1.41, BUN 19, Potassium 4.2, Sodium 140, EGFR 52-60, BNP 911.4 08/14/2016 Creatinine 1.56, BUN 20, Potassium 4.1, Sodium 137, EGFR 46-53 07/07/2016 Creatinine 1.36, BUN 27, Potassium 4.2, Sodium 139, EGFR 55-64 06/22/2016 Creatinine 1.13, BUN 21, Potassium 4.4, Sodium 137, EGFR >60, BNP 509.8 05/27/2016 Creatinine 1.41, BUN 24, Potassium 4.9, Sodium 138, EGFR 53-61  05/20/2016 Creatinine 1.23, BUN 15, Potassium 4.8, Sodium 137, EGFR >60, BNP 793.4 04/23/2016 Creatinine 1.28, BUN 21, Potassium 4.4, Sodium 139  02/22/2018Creatinine 1.37, BUN 25, Potassium 4.5, Sodium 137, EGFR >60  02/19/2018Creatinine 1.27, BUN 21, Potassium 5.1, Sodium 139, EGFR 58->60  02/06/2018Creatinine 1.10, BUN 19, Potassium 4.2, Sodium 138, EGFR >60  02/05/2018Creatinine 1.13, BUN 18, Potassium 4.1, Sodium 135, EGFR >60  02/04/2018Creatinine 1.04, BUN 19, Potassium 4.4, Sodium 136, EGFR >60  02/03/2018Creatinine 1.09, BUN 16, Potassium 4.4, Sodium 135, EGFR >60   Recommendations:  Patient self adjusted Furosemide 40 mg to 1 tablet twice a day for 2 days (7/16 & 7/17) which correlates with improvement of thoracic impedance.  He admits to eating too much salt for at least the last 5 days and understands this is cause of increased fluid levels.  Discussed the need to limit salt to 2000 mg daily.    Follow-up plan: ICM clinic phone appointment on 09/17/2016.   Office appointment scheduled tomorrow, 09/09/2016 with HF clinic.  Copy of ICM check sent to primary cardiologist and device physician.   3 month ICM trend: 09/08/2016   1 Year ICM trend:      Rosalene Billings, RN 09/08/2016 9:21 AM

## 2016-09-08 NOTE — Telephone Encounter (Signed)
[name] called stating that he has been increasingly SOB and bloated and has gained about 4-5 pounds in the last week for which he has taken an additional 40 mg of furosemide on top of his 40 mg daily for the last 2 days including this morning. Per discussion with Amy Clegg, NP-C, will have him take an additional 80 mg of furosemide tonight and tomorrow morning before his appointment with Amy at 10:30 am. Patient verbalized understanding of instructions and was grateful for the advice.   Tyler DeisErika K. Bonnye FavaNicolsen, PharmD, BCPS, CPP Clinical Pharmacist Pager: [phone] Phone: [phone] 09/08/2016 2:33 PM

## 2016-09-09 ENCOUNTER — Ambulatory Visit (HOSPITAL_COMMUNITY)
Admission: RE | Admit: 2016-09-09 | Discharge: 2016-09-09 | Disposition: A | Discharge status: Home / Self Care | Payer: Medicaid Other | Source: Ambulatory Visit | Attending: Cardiology | Admitting: Cardiology

## 2016-09-09 ENCOUNTER — Telehealth (HOSPITAL_COMMUNITY): Payer: Self-pay

## 2016-09-09 VITALS — BP 88/52 | HR 91 | Wt 214.0 lb

## 2016-09-09 DIAGNOSIS — Z8673 Personal history of transient ischemic attack (TIA), and cerebral infarction without residual deficits: Secondary | ICD-10-CM | POA: Insufficient documentation

## 2016-09-09 DIAGNOSIS — I4510 Unspecified right bundle-branch block: Secondary | ICD-10-CM | POA: Diagnosis not present

## 2016-09-09 DIAGNOSIS — I5022 Chronic systolic (congestive) heart failure: Secondary | ICD-10-CM

## 2016-09-09 DIAGNOSIS — I712 Thoracic aortic aneurysm, without rupture: Secondary | ICD-10-CM | POA: Insufficient documentation

## 2016-09-09 DIAGNOSIS — Z87891 Personal history of nicotine dependence: Secondary | ICD-10-CM | POA: Insufficient documentation

## 2016-09-09 DIAGNOSIS — Z809 Family history of malignant neoplasm, unspecified: Secondary | ICD-10-CM | POA: Diagnosis not present

## 2016-09-09 DIAGNOSIS — F4310 Post-traumatic stress disorder, unspecified: Secondary | ICD-10-CM | POA: Insufficient documentation

## 2016-09-09 DIAGNOSIS — Z95810 Presence of automatic (implantable) cardiac defibrillator: Secondary | ICD-10-CM | POA: Diagnosis not present

## 2016-09-09 DIAGNOSIS — R001 Bradycardia, unspecified: Secondary | ICD-10-CM | POA: Insufficient documentation

## 2016-09-09 DIAGNOSIS — I2510 Atherosclerotic heart disease of native coronary artery without angina pectoris: Secondary | ICD-10-CM | POA: Insufficient documentation

## 2016-09-09 DIAGNOSIS — I340 Nonrheumatic mitral (valve) insufficiency: Secondary | ICD-10-CM | POA: Diagnosis not present

## 2016-09-09 DIAGNOSIS — Z8249 Family history of ischemic heart disease and other diseases of the circulatory system: Secondary | ICD-10-CM | POA: Diagnosis not present

## 2016-09-09 DIAGNOSIS — Z7902 Long term (current) use of antithrombotics/antiplatelets: Secondary | ICD-10-CM | POA: Insufficient documentation

## 2016-09-09 DIAGNOSIS — I472 Ventricular tachycardia, unspecified: Secondary | ICD-10-CM

## 2016-09-09 DIAGNOSIS — F17200 Nicotine dependence, unspecified, uncomplicated: Secondary | ICD-10-CM | POA: Diagnosis not present

## 2016-09-09 DIAGNOSIS — I255 Ischemic cardiomyopathy: Secondary | ICD-10-CM | POA: Diagnosis not present

## 2016-09-09 DIAGNOSIS — Z7982 Long term (current) use of aspirin: Secondary | ICD-10-CM | POA: Diagnosis not present

## 2016-09-09 LAB — BASIC METABOLIC PANEL
Anion gap: 9 (ref 5–15)
BUN: 21 mg/dL — AB (ref 6–20)
CHLORIDE: 102 mmol/L (ref 101–111)
CO2: 27 mmol/L (ref 22–32)
CREATININE: 1.48 mg/dL — AB (ref 0.61–1.24)
Calcium: 9.2 mg/dL (ref 8.9–10.3)
GFR calc Af Amer: 56 mL/min — ABNORMAL LOW (ref >60)
GFR calc non Af Amer: 49 mL/min — ABNORMAL LOW (ref >60)
GLUCOSE: 101 mg/dL — AB (ref 65–99)
POTASSIUM: 4 mmol/L (ref 3.5–5.1)
Sodium: 138 mmol/L (ref 135–145)

## 2016-09-09 LAB — BRAIN NATRIURETIC PEPTIDE: B Natriuretic Peptide: 840 pg/mL — ABNORMAL HIGH (ref 0.0–100.0)

## 2016-09-09 MED ORDER — FUROSEMIDE 40 MG PO TABS: 80.0000 mg | ORAL_TABLET | Freq: Every day | ORAL | 6 refills | Status: DC

## 2016-09-09 NOTE — Patient Instructions (Addendum)
Routine lab work today. Will notify you of abnormal results, otherwise no news is good news!  INCREASE lasix to 80 mg (2 tabs) once daily. May take extra 40 mg (1 tab) once daily as needed for weight gain 3 lbs or more overnight.  Follow up with echocardiogram and appointment with [name] as scheduled. See next sheet for appointment details highlighted.  Take all medication as prescribed the day of your appointment. Bring all medications with you to your appointment.  Do the following things EVERYDAY: 1) Weigh yourself in the morning before breakfast. Write it down and keep it in a log. 2) Take your medicines as prescribed 3) Eat low salt foods-Limit salt (sodium) to 2000 mg per day.  4) Stay as active as you can everyday 5) Limit all fluids for the day to less than 2 liters

## 2016-09-09 NOTE — Progress Notes (Signed)
Cardiology: Dr. Jens Som HF Cardiology: Dr. Shirlee Latch    Advanced Heart Failure Clinic Note   61 yo with history of smoking, CAD s/p multiple PCIs, chronic systolic CHF/ischemic cardiomyopathy presents for HF clinic followup.  Cardiac cath in 9/17 showed stable anatomy, no intervention.  EF has been markedly reduced for several years now.  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 PICC was placed and low cardiac output confirmed.  He was started on milrinone and diuresed.  He had CRT upgrade of his Medtronic device.    He was admitted again in 12/17 with multiple ICD discharges in the setting of VT storm.  He was started on amiodarone and mexiletine.  He has had PTSD symptoms since that time. He has had no further VT.  In the hospital, LV lead was turned off (?pro-arrhythmic).  Coronary angiography was done, showing no change compared to 9/17.    Admitted 1/23 - 03/31/16 with acute on chronic CHF.  BCx drawn with worsening back pain and PICC line in place. Found to have Coag negative staph 2/2 BCx. Started on IV antibiotics. Found to have ICD infection with ulceration and drainage from site.  ICD removed 03/23/16 and replaced 03/27/16 with dual chamber ICD with His bundle pacing. IV ABX continued until 04/06/16.   Today he returns for HF follow up. Since the last visit he was seen in the ED on July 2nd  and was volume overloaded . He received IV lasix and discharged home.  Yesterday he was SOB and instructed to increase lasix to 80 mg twice a day. Weight went down 5 pounds. Weight at home was 218>211 pounds. Overall feeling better. Mild dyspnea. Denies PND/Orthopnea. Appetite ok. Eating frozen meals. No shocks. Taking all medications.   Optivol: Fluid index well above threshold. Activity ~6 hours.   Labs (10/17): hgb 15.8, K 4.3, creatinine 1.11 => 0.98, AST 78 => 43, ALT 105 => 61, digoxin 0.4 Labs (12/17):  digoxin < 0.5, mg 1.8, K 4.4, creatinine 1.23 Labs (3/18): K 4.4, creatinine 1.28 Labs (4/18): BNP 510 Labs (5/18): K 4.2, creatinine 1.36 Lab (/03/2016): K 4.2 Creatinine 1.41   PMH: 1. H/o smoking: PFTs (6/17) with minimal obstruction on PFTs. Quit 2/18.  2. Coronary artery disease: Multiple PCIs in the past.  - LHC/RHC (9/17): 85% ostial D2, 99% ostial OM1 (no change from prior) with patent LAD, LCX, OM2, and RCA stents; mean RA 8, PA 58/27 mean 39, mean PCWP 29, CI 2.25, PVR 2.1.  - LHC (12/17): No change compared to 9/17.  3. Chronic systolic CHF: Ischemic cardiomyopathy.   - Echo (10/17): EF 15%, moderate MR, mild to moderate AI, mildly decreased RV systolic function.  - Medtronic CRT-D device => LV lead turned off due to concern for possible pro-arrhythmia.  - CPX (11/17): peak VO2 20.6, VE/VCO2 slope 38, RER 0.97.  Submaximal, probably mild to moderate HF limitation.  - TEE (03/20/16) with LVEF 15% with mod/severe MR.  - RHC (1/18): mean RA 1, PA 36/11, mean PCWP 7, CI 3.06 Fick/3.08 thermo - 1/18 device infection, CRT-D system removed and replaced with a Medtronic ICD with His bundle pacing.  - CPX (5/18): Peak VO2 16.3 (62% predicted), VE/VCO2 slope 44, RER 1.08 => moderate HF limitation.  4. 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  5. H/o TIA 6. Ascending aortic aneurysm: 4.1 cm on  CTA chest in 8/17.  7. Atrial tachycardia: Noted on device interrogation.  8. 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  LV lead turned off due to concern for pro-arrhythmia.  9. Mitral regurgitation: Moderate to severe on 1/18 TEE, likely functional.   ROS: All systems reviewed and negative except as per HPI.   Social History   Social History  . Marital status: Legally Separated    Spouse name: N/A  . Number of children: N/A  . Years of education: N/A   Occupational History  . Not on file.   Social History Main Topics  . Smoking status: Former  Smoker    Packs/day: 1.00    Years: 47.00    Types: Cigarettes    Quit date: 11/20/2015  . Smokeless tobacco: Never Used  . Alcohol use 3.6 oz/week    6 Cans of beer per week  . Drug use: No  . Sexual activity: Not Currently   Other Topics Concern  . Not on file   Social History Narrative   Married; full time.    Family History  Problem Relation Age of Onset  . Cancer Father   . Cancer Sister        twin sister and only one has cancer, unknown what kind  . Hypertension Brother   . Cancer Unknown        family hx  . Heart attack Neg Hx   . Stroke Neg Hx    Current Outpatient Prescriptions  Medication Sig Dispense Refill  . ALPRAZolam (XANAX) 0.25 MG tablet Take 1 tablet (0.25 mg total) by mouth at bedtime as needed for anxiety. F/u with PCP for refills. 30 tablet 0  . amiodarone (PACERONE) 200 MG tablet TAKE 1 TABLET BY MOUTH EVERY DAY 90 tablet 3  . aspirin EC 81 MG tablet Take 81 mg by mouth daily.    [name] atorvastatin (LIPITOR) 40 MG tablet Take 1 tablet (40 mg total) by mouth daily. 30 tablet 5  . bisoprolol (ZEBETA) 5 MG tablet Take 0.5 tablets (2.5 mg total) by mouth at bedtime. 15 tablet 11  . clopidogrel (PLAVIX) 75 MG tablet Take 1 tablet (75 mg total) by mouth daily. 30 tablet 5  . digoxin (LANOXIN) 0.125 MG tablet Take one tablet (0.125 mg) by mouth once every other day 30 tablet 3  . famotidine (PEPCID) 20 MG tablet Take 1 tablet (20 mg total) by mouth 2 (two) times daily. 60 tablet 6  . furosemide (LASIX) 40 MG tablet Take 1 tablet (40 mg total) by mouth daily. 32 tablet 3  . hydrOXYzine (VISTARIL) 25 MG capsule Take 25 mg by mouth at bedtime as needed (sleep).    . losartan (COZAAR) 25 MG tablet Take 12.5 mg (1/2 tablet) in the morning and 25 mg (1 tablet) in the evening    . magnesium oxide (MAG-OX) 400 (241.3 Mg) MG tablet Take 1 tablet (400 mg total) by mouth daily. 30 tablet 6  . mexiletine (MEXITIL) 200 MG capsule TAKE ONE CAPSULE BY MOUTH EVERY 12 HOURS 60  capsule 3  . PARoxetine (PAXIL) 20 MG tablet Take 1 tablet (20 mg total) by mouth daily. 30 tablet 3  . sildenafil (REVATIO) 20 MG tablet Take 1 tablet (20 mg total) by mouth as needed. 10 tablet 0  . spironolactone (ALDACTONE) 25 MG tablet Take 12.5 mg by mouth daily.    [name] triamcinolone cream (KENALOG) 0.1 % USE 1 APPLICATION TOPICALLY TO THE AFFECTED AREA TWICE  A DAY AS NEEDED FOR ECZEMA  5  . NITROSTAT 0.4 MG SL tablet PLACE 1 TABLET (0.4 MG TOTAL) UNDER THE TONGUE EVERY 5 (FIVE) MINUTES AS NEEDED FOR CHEST PAIN (Patient not taking: Reported on 09/09/2016) 25 tablet 6   No current facility-administered medications for this encounter.    BP (!) 88/52   Pulse 91   Wt 214 lb (97.1 kg)   SpO2 97%   BMI 29.85 kg/m    Wt Readings from Last 3 Encounters:  09/09/16 214 lb (97.1 kg)  08/24/16 215 lb (97.5 kg)  08/14/16 214 lb 8 oz (97.3 kg)     General:  Well appearing. No resp difficulty. Walked in the clinic  HEENT: normal Neck: supple. JVP 6-7 . Carotids 2+ bilat; no bruits. No lymphadenopathy or thryomegaly appreciated. Cor: PMI nondisplaced. Regular rate & rhythm. No rubs, gallops or murmurs. R upper chest ICD.  Lungs: clear Abdomen: soft, nontender, nondistended. No hepatosplenomegaly. No bruits or masses. Good bowel sounds. Extremities: no cyanosis, clubbing, rash, edema Neuro: alert & orientedx3, cranial nerves grossly intact. moves all 4 extremities w/o difficulty. Affect pleasant   Assessment/Plan: 1. Chronic systolic CHF: With low cardiac output noted on during admission in 10/17.  Ischemic cardiomyopathy, had upgrade to Medtronic BiV ICD in 10/17.  However, with VT storm in 12/17, the LV lead was turned off due to concerns for pro-arrhythmia.  In 1/18, he developed a device infection and the BiV-ICD was removed and later replaced with a Medtronic ICD with His bundle pacing.  RHC (1/18) with preserved cardiac output.  TEE (1/18) with EF 15%.  CPX in 5/18 with moderate HF  limitation. Volume status improved with 80 mg of lasix. I suspect volume status was elevated with high salt diet. Discussed low salt diet.  Change lasix to 80 mg daily with extra 40 mg lasix for 3 pound weight gain in 24 hours.  SBP to soft to increase bb or losartan.     - Did not tolerate Entresto due to orthostasis.  -Continue  losartan to 25 qam/12.5 qpm.  - Continue spironolactone 25 mg daily.   - Continue digoxin 0.125 mg every other day, check level today.  - Continue bisoprolol 2.5 qhs.  - Repeat echo in 8/18.  May eventually need to consider for LVAD. Has 3 children and lives with sister.  Will follow his clinical trajectory closely. He quit smoking in 2/18.  Given his concerning clinical trajectory (though somewhat stabilized recently), will refer for transplant evaluation.      2.  CAD: No chest pain.   LHC in 12/17 with stable coronary disease. No Cp.  - Continue ASA 81, Plavix, and statin.  3. Smoking: Remains abstinent since 2/18. Not smoking.  4. 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  Suspect this contributed to HF with loss of AV synchrony (and also was bradycardic).  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  He was BiV paced after 10/17 admission, but LV lead was turned off due to concern for pro-arrhythmia.  He now has a Medtronic ICD with His bundle pacing.  5. Ascending aortic aneurysm: 4.1 cm in 8/17. Needs repeat CT in 8/18.  6. Ventricular tachycardia: VT storm in 12/17.   - Continue amiodarone and mexiletine. No VT on interrogation today.   7. PTSD: Patient is on paroxetine due to significant anxiety around shocks.  He has a hard time sleeping. Continue xanax as needed.  Refer to cardiac rehab. Check BMET today. Follow up 4 weeks with an ECHO and Dr Shirlee Latch .  Greater than 50% of the (total minutes 25) visit spent in counseling/coordination of care regarding heart failure diet, diuretics, and limiting fluid intake to < 2 liters per day.     Tonye Becket, NP  09/09/2016

## 2016-09-09 NOTE — Telephone Encounter (Signed)
CHF Clinic appointment reminder call placed to patient for upcoming post-hospital follow up.  Does understand purpose of this appointment and where CHF Clinic is located? Patient confirms apt date/time/location and reason for visit  How is patient feeling? Well, no complaints  Does patient have all of their medications since their recent discharge? Yes  Patient also reminded to take all medications as prescribed on the day of his/her appointment and to bring all medications to this appointment.  Advised to call our office for tardiness or cancellations/rescheduling needs.  .Amrie Gurganus Genevea  

## 2016-09-14 ENCOUNTER — Telehealth (HOSPITAL_COMMUNITY): Payer: Self-pay | Admitting: Pharmacist

## 2016-09-15 ENCOUNTER — Telehealth (HOSPITAL_COMMUNITY): Payer: Self-pay | Admitting: Cardiac Rehabilitation

## 2016-09-15 ENCOUNTER — Telehealth (HOSPITAL_COMMUNITY): Payer: Self-pay | Admitting: *Deleted

## 2016-09-15 NOTE — Telephone Encounter (Signed)
Called and reported his BP was 91/62 and while talking on the phone with me is was 104/60.  Patient is complaining of feeling lightheaded and dizzy this afternoon.  I told him to hold his bisoprolol and losartan tonight but if he were to start feeling worse to go to the ER.  I'm sending message to [name] and will call patient tomorrow with his response.

## 2016-09-15 NOTE — Telephone Encounter (Signed)
Cardiac Rehab Medication Review by a Pharmacist  Does the patient  feel that his/her medications are working for him/her?  yes  Has the patient been experiencing any side effects to the medications prescribed?  Yes. Had dizzy spell on 7/24 (look below). Typically tolerates his medications with no complaints, so patient does not think his medications caused this.  Does the patient measure his/her own blood pressure or blood glucose at home?  Yes. BP 81/43 on 7/24, got lightheaded and blurry vision, and has been checking blood pressure every 5 minutes since episode around 4:15pm. It seems to be improving, but still feels a little lightheaded. Told patient to give doctor a call after I got off of the phone.   Does the patient have any problems obtaining medications due to transportation or finances?   No  Understanding of regimen: excellent Understanding of indications: good Potential of compliance: excellent   Pharmacist comments: Called patient around 4pm and he said he was busy and to try him back in 30 minutes. When I reached him, he mentioned he had low BP and was checking his BP every 5 minutes. He mentioned that he would call Dr after we got off of the phone. Patient was able to recall every medication and how to take each of the medications even before I asked him how he was taking it. Not on any OTC or herbal medications. Overall, he had no other questions or concerns, and was appreciative for the phone call.   Donnella Biyler Dashiell Franchino, PharmD PGY1 Acute Care Pharmacy Resident Pager: [phone] 09/15/2016 4:41 PM

## 2016-09-15 NOTE — Telephone Encounter (Signed)
Stop the morning losartan and only take the evening dose.

## 2016-09-15 NOTE — Telephone Encounter (Signed)
-----   Message from Laurey Moralealton S McLean, MD sent at 09/15/2016  4:24 PM EDT ----- Regarding: RE: cardiac rehab  No and no.  SBP < 150 but should not have problems with that.  ----- Message ----- From: Robyne Peersion, Joann H, RN Sent: 09/15/2016   7:42 AM To: Laurey Moralealton S McLean, MD Subject: cardiac rehab                                  Dear [name],  Pt with 4.1cm AAA scheduled to begin cardiac rehab.  Are there any activity restrictions?  What are hand weight limitations?  Also, please indicate blood pressure parameters.  Thank you, Deveron FurlongJoann Rion, RN, BSN Cardiac Pulmonary Rehab

## 2016-09-16 MED ORDER — LOSARTAN POTASSIUM 25 MG PO TABS: 25.0000 mg | ORAL_TABLET | Freq: Every day | ORAL | 3 refills | Status: DC

## 2016-09-16 NOTE — Telephone Encounter (Signed)
Called and spoke with patient, he is aware of the medication change and will call us back if he continues to feel dizzy. Medication list updated

## 2016-09-17 ENCOUNTER — Telehealth: Payer: Self-pay | Admitting: Cardiology

## 2016-09-17 ENCOUNTER — Ambulatory Visit (INDEPENDENT_AMBULATORY_CARE_PROVIDER_SITE_OTHER): Payer: Self-pay

## 2016-09-17 ENCOUNTER — Encounter (HOSPITAL_COMMUNITY): Payer: Self-pay

## 2016-09-17 ENCOUNTER — Encounter (HOSPITAL_COMMUNITY)
Admission: RE | Admit: 2016-09-17 | Discharge: 2016-09-17 | Disposition: A | Discharge status: Home / Self Care | Payer: Medicaid Other | Source: Ambulatory Visit | Attending: Cardiology | Admitting: Cardiology

## 2016-09-17 VITALS — BP 98/63 | HR 78 | Ht 77.75 in | Wt 219.8 lb

## 2016-09-17 DIAGNOSIS — Z7902 Long term (current) use of antithrombotics/antiplatelets: Secondary | ICD-10-CM | POA: Insufficient documentation

## 2016-09-17 DIAGNOSIS — Z7982 Long term (current) use of aspirin: Secondary | ICD-10-CM | POA: Insufficient documentation

## 2016-09-17 DIAGNOSIS — I5022 Chronic systolic (congestive) heart failure: Secondary | ICD-10-CM

## 2016-09-17 DIAGNOSIS — Z95810 Presence of automatic (implantable) cardiac defibrillator: Secondary | ICD-10-CM

## 2016-09-17 DIAGNOSIS — Z79899 Other long term (current) drug therapy: Secondary | ICD-10-CM | POA: Insufficient documentation

## 2016-09-17 DIAGNOSIS — I509 Heart failure, unspecified: Secondary | ICD-10-CM | POA: Diagnosis present

## 2016-09-17 NOTE — Telephone Encounter (Signed)
Spoke with pt and reminded pt of remote transmission that is due today. Pt verbalized understanding.   
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
## 2016-09-17 NOTE — Progress Notes (Signed)
EPIC Encounter for ICM Monitoring  Patient Name: [name] is a 61 y.o. male Date: 09/17/2016 Primary Care Physican: Lonie Peakonroy, Nathan, PA-C Primary Cardiologist: Crenshaw/[place] Electrophysiologist: Graciela HusbandsKlein Dry Weight:213lbs  Bi-V Pacing: 100%         Heart Failure questions reviewed, pt asymptomatic.   Thoracic impedance normal since 09/10/2016.  Prescribed dosage: Furosemide  40 mg 2 tablets (80 mg total) by mouth daily. May take extra 40 mg (1 tab) once daily for weight gain 3 lbs or more overnight.  Recommendations: No changes.  Encouraged to call for fluid symptoms.  Follow-up plan: ICM clinic phone appointment on 10/08/2016.    Copy of ICM check sent to device physician.   3 month ICM trend: 09/17/2016   1 Year ICM trend:      [name] SodaLaurie S Lavine Hargrove, RN 09/17/2016 12:13 PM

## 2016-09-21 ENCOUNTER — Other Ambulatory Visit: Payer: Self-pay | Admitting: Physician Assistant

## 2016-09-22 NOTE — Telephone Encounter (Signed)
OK to refill under Dr. Graciela HusbandsKlein.   Thanks!

## 2016-09-23 ENCOUNTER — Other Ambulatory Visit: Payer: Self-pay | Admitting: Cardiology

## 2016-09-23 ENCOUNTER — Encounter (HOSPITAL_COMMUNITY)
Admission: RE | Admit: 2016-09-23 | Discharge: 2016-09-23 | Disposition: A | Discharge status: Home / Self Care | Payer: Medicaid Other | Source: Ambulatory Visit | Attending: Internal Medicine | Admitting: Internal Medicine

## 2016-09-23 DIAGNOSIS — I5022 Chronic systolic (congestive) heart failure: Secondary | ICD-10-CM | POA: Diagnosis not present

## 2016-09-23 DIAGNOSIS — Z79899 Other long term (current) drug therapy: Secondary | ICD-10-CM | POA: Insufficient documentation

## 2016-09-23 DIAGNOSIS — Z7982 Long term (current) use of aspirin: Secondary | ICD-10-CM | POA: Diagnosis not present

## 2016-09-23 DIAGNOSIS — Z7902 Long term (current) use of antithrombotics/antiplatelets: Secondary | ICD-10-CM | POA: Insufficient documentation

## 2016-09-23 DIAGNOSIS — I509 Heart failure, unspecified: Secondary | ICD-10-CM | POA: Diagnosis present

## 2016-09-23 NOTE — Progress Notes (Signed)
Daily Session Note  Patient Details  Name: [name] MRN: [id number] Date of Birth: 1955-12-21 Referring Provider:     CARDIAC REHAB PHASE II ORIENTATION from 09/17/2016 in [facility]  Referring Provider  Loralie Champagne MD      Encounter Date: 09/23/2016  Check In:     Session Check In - 09/23/16 1545      Check-In   Location [facility]   Staff Present Cleda Mccreedy, MS, Exercise Physiologist;Amber Fair, MS, ACSM RCEP, Exercise Physiologist;Joann Rion, RN, Marga Melnick, RN, BSN   Supervising physician immediately available to respond to emergencies [facility] immediately available   Physician(s) Dr. Jonnie Finner   Medication changes reported     No   Fall or balance concerns reported    No   Tobacco Cessation No Change   Warm-up and Cool-down Performed as group-led instruction   Resistance Training Performed No   VAD Patient? No     Pain Assessment   Currently in Pain? No/denies   Multiple Pain Sites No      Capillary Blood Glucose: No results found for this or any previous visit (from the past 24 hour(s)).    History  Smoking Status  . Former Smoker  . Packs/day: 1.00  . Years: 47.00  . Types: Cigarettes  . Quit date: 11/20/2015  Smokeless Tobacco  . Never Used    Goals Met:  Exercise tolerated well  Goals Unmet:  Not Applicable  Comments: [name] started cardiac rehab today.  Pt tolerated light exercise without difficulty. VSS, telemetry-vpaced , asymptomatic.  Medication list reconciled. Pt denies barriers to medicaiton compliance.  PSYCHOSOCIAL ASSESSMENT:  PHQ-0. Pt exhibits positive coping skills, hopeful outlook with supportive family. No psychosocial needs identified at this time, no psychosocial interventions necessary.    Pt enjoys riding his motorcycle.   Pt oriented to exercise equipment and routine.    Understanding verbalized.Barnet Pall, RN,BSN 09/24/2016 10:17 AM   Dr. Fransico Him  is Medical Director for Cardiac Rehab at [facility].

## 2016-09-23 NOTE — Progress Notes (Signed)
[name] 61 y.o. male DOB 06/09/1955 MRN [id number]       Nutrition Screen & Note  Dx: CHF  Past Medical History:  Diagnosis Date  . Anxiety   . Automatic implantable cardioverter-defibrillator in situ    a. Medtronic ICD.  [name]. CAD (coronary artery disease)    a.  Severe LAD stenosis 2/2 acute thrombus - BMS 2009;  b. 06/01/11 Cath - LAD 20 isr, 2162m (3.0x16 Veri-flex BMS), OM1 100p, EF 20-25%;  c. 07/2012 Abnl Cardiolite;  d. 08/2012 Cath/PCI: LM nl, LAD patent stents, D2 80-90 (jailed), LCX 90 p/m (4.0x24 Promus Premier DES), OM1 100, OM2 80-90p (3.0x20 Promus Premier DES), RCA patent mid stent, 40d, EF 25%.  e. 10/29/15 LHC stable disease with patent stents  . Chronic systolic CHF (congestive heart failure), NYHA class 3 (HCC) - low output state 12/12/2014  . Depression   . Dyspnea   . History of rheumatic fever 1962  . HLD (hyperlipidemia) mixed  . HTN (hypertension)   . Ischemic cardiomyopathy    a.  EF 30-35% 2010;  b.  EF 20-25% by LV gram 08/2012. c. EF 15% by echo 11/2014.  [name]. Myocardial infarction [facility](HCC) 1998; 2002; ~ 2010  . Paroxysmal ventricular tachycardia (HCC)    a. VFlutter  CL 210 msec  Rx shock 04/2011  . PVC's (premature ventricular contractions)   . RBBB   . TIA (transient ischemic attack)    a. Dx 11/2014, continued on ASA/Plavix.   Meds reviewed.   HT: Ht Readings from Last 1 Encounters:  09/17/16 6' 5.75" (1.975 m)    WT: Wt Readings from Last 3 Encounters:  09/17/16 219 lb 12.8 oz (99.7 kg)  09/09/16 214 lb (97.1 kg)  08/24/16 215 lb (97.5 kg)     BMI 25.6   Current tobacco use? No  Labs:  Lipid Panel     Component Value Date/Time   CHOL 122 12/07/2015 0438   TRIG 73 12/07/2015 0438   HDL 25 (L) 12/07/2015 0438   CHOLHDL 4.9 12/07/2015 0438   VLDL 15 12/07/2015 0438   LDLCALC 82 12/07/2015 0438   LDLDIRECT 159.2 02/04/2010 1622   CBG (last 3)  No results for input(s): GLUCAP in the last 72 hours.  Nutrition Diagnosis ? Food-and  nutrition-related knowledge deficit related to lack of exposure to information as related to diagnosis of: ? CHF ? Overweight related to excessive energy intake as evidenced by a BMI of 25.6  Nutrition Goal(s):  ? Pt to identify and limit food sources of saturated fat, trans fat, and sodium ? Pt to identify food quantities necessary to achieve weight loss of 6-24 lb (2.7-10.9 kg) at graduation from cardiac rehab.   Plan:  Pt to attend nutrition classes ? Nutrition I ? Nutrition II ? Portion Distortion  Will provide client-centered nutrition education as part of interdisciplinary care.   Monitor and evaluate progress toward nutrition goal with team.  Mickle PlumbEdna Veldon Wager, M.Ed, RD, LDN, CDE 09/23/2016 2:13 PM

## 2016-09-24 NOTE — Telephone Encounter (Signed)
This is a CHF pt 

## 2016-09-25 ENCOUNTER — Encounter (HOSPITAL_COMMUNITY)
Admission: RE | Admit: 2016-09-25 | Discharge: 2016-09-25 | Disposition: A | Discharge status: Home / Self Care | Payer: Medicaid Other | Source: Ambulatory Visit | Attending: Internal Medicine | Admitting: Internal Medicine

## 2016-09-25 ENCOUNTER — Telehealth (HOSPITAL_COMMUNITY): Payer: Self-pay

## 2016-09-25 DIAGNOSIS — I5022 Chronic systolic (congestive) heart failure: Secondary | ICD-10-CM

## 2016-09-25 NOTE — Progress Notes (Signed)
Weight is up 1.5 kg from Wednesday. [name] denies swelling but reports experiencing some shortness of breath. Lung fields clear upon ascultation. Oxygen saturation 98% on room air.  [name] GriceMeghan Bradley RN called and notfied at the heart failure clinic. [name] has instructions to take an extra lasix if he gains more than 3 lbs. [name] says he is going to take an extra lasix when he gets home today.Will continue to monitor the patient throughout  the program.[name] [name] FlorWhitaker, RN,BSN 09/25/2016 4:31 PM

## 2016-09-25 NOTE — Telephone Encounter (Signed)
Patient's weight up a couple of lbs and slightly more SOB. Advised to take his extra prn 40 mg lasix once today. Patient will call back Monday if s/s no better.  Ave FilterBradley, Megan Genevea, RN

## 2016-09-28 ENCOUNTER — Encounter (HOSPITAL_COMMUNITY)
Admission: RE | Admit: 2016-09-28 | Discharge: 2016-09-28 | Disposition: A | Discharge status: Home / Self Care | Payer: Medicaid Other | Source: Ambulatory Visit | Attending: Cardiology | Admitting: Cardiology

## 2016-09-28 DIAGNOSIS — I5022 Chronic systolic (congestive) heart failure: Secondary | ICD-10-CM | POA: Diagnosis not present

## 2016-09-30 ENCOUNTER — Encounter (HOSPITAL_COMMUNITY): Payer: Medicaid Other

## 2016-10-02 ENCOUNTER — Encounter (HOSPITAL_COMMUNITY): Payer: Medicaid Other

## 2016-10-02 ENCOUNTER — Telehealth (HOSPITAL_COMMUNITY): Payer: Self-pay | Admitting: Physician Assistant

## 2016-10-05 ENCOUNTER — Emergency Department (HOSPITAL_COMMUNITY): Payer: Medicaid Other

## 2016-10-05 ENCOUNTER — Telehealth (HOSPITAL_COMMUNITY): Payer: Self-pay | Admitting: *Deleted

## 2016-10-05 ENCOUNTER — Emergency Department (HOSPITAL_COMMUNITY)
Admission: EM | Admit: 2016-10-05 | Discharge: 2016-10-05 | Disposition: A | Discharge status: Home / Self Care | Payer: Medicaid Other | Attending: Emergency Medicine | Admitting: Emergency Medicine

## 2016-10-05 ENCOUNTER — Encounter (HOSPITAL_COMMUNITY): Payer: Self-pay | Admitting: Nurse Practitioner

## 2016-10-05 ENCOUNTER — Other Ambulatory Visit: Payer: Self-pay

## 2016-10-05 ENCOUNTER — Encounter (HOSPITAL_COMMUNITY)
Admission: RE | Admit: 2016-10-05 | Discharge: 2016-10-05 | Disposition: A | Discharge status: Home / Self Care | Payer: Medicaid Other | Source: Ambulatory Visit | Attending: Internal Medicine | Admitting: Internal Medicine

## 2016-10-05 DIAGNOSIS — R0602 Shortness of breath: Secondary | ICD-10-CM | POA: Diagnosis present

## 2016-10-05 DIAGNOSIS — Z7902 Long term (current) use of antithrombotics/antiplatelets: Secondary | ICD-10-CM | POA: Diagnosis not present

## 2016-10-05 DIAGNOSIS — Z87891 Personal history of nicotine dependence: Secondary | ICD-10-CM | POA: Diagnosis not present

## 2016-10-05 DIAGNOSIS — I2510 Atherosclerotic heart disease of native coronary artery without angina pectoris: Secondary | ICD-10-CM | POA: Diagnosis not present

## 2016-10-05 DIAGNOSIS — I5023 Acute on chronic systolic (congestive) heart failure: Secondary | ICD-10-CM | POA: Diagnosis not present

## 2016-10-05 DIAGNOSIS — Z79899 Other long term (current) drug therapy: Secondary | ICD-10-CM | POA: Diagnosis not present

## 2016-10-05 DIAGNOSIS — I110 Hypertensive heart disease with heart failure: Secondary | ICD-10-CM | POA: Diagnosis not present

## 2016-10-05 DIAGNOSIS — Z7982 Long term (current) use of aspirin: Secondary | ICD-10-CM | POA: Insufficient documentation

## 2016-10-05 LAB — I-STAT TROPONIN, ED: TROPONIN I, POC: 0.01 ng/mL (ref 0.00–0.08)

## 2016-10-05 LAB — CBC
HCT: 37.2 % — ABNORMAL LOW (ref 39.0–52.0)
Hemoglobin: 12.4 g/dL — ABNORMAL LOW (ref 13.0–17.0)
MCH: 32.5 pg (ref 26.0–34.0)
MCHC: 33.3 g/dL (ref 30.0–36.0)
MCV: 97.4 fL (ref 78.0–100.0)
Platelets: 249 10*3/uL (ref 150–400)
RBC: 3.82 MIL/uL — ABNORMAL LOW (ref 4.22–5.81)
RDW: 15.5 % (ref 11.5–15.5)
WBC: 8.2 10*3/uL (ref 4.0–10.5)

## 2016-10-05 LAB — BASIC METABOLIC PANEL
Anion gap: 9 (ref 5–15)
BUN: 25 mg/dL — AB (ref 6–20)
CALCIUM: 9 mg/dL (ref 8.9–10.3)
CO2: 25 mmol/L (ref 22–32)
Chloride: 104 mmol/L (ref 101–111)
Creatinine, Ser: 1.55 mg/dL — ABNORMAL HIGH (ref 0.61–1.24)
GFR calc Af Amer: 53 mL/min — ABNORMAL LOW (ref >60)
GFR, EST NON AFRICAN AMERICAN: 46 mL/min — AB (ref >60)
GLUCOSE: 87 mg/dL (ref 65–99)
Potassium: 4 mmol/L (ref 3.5–5.1)
Sodium: 138 mmol/L (ref 135–145)

## 2016-10-05 MED ORDER — MAGNESIUM OXIDE 400 (241.3 MG) MG PO TABS
800.0000 mg | ORAL_TABLET | Freq: Once | ORAL | Status: AC
  Administered 2016-10-05: 800 mg via ORAL
  Filled 2016-10-05: qty 2

## 2016-10-05 MED ORDER — FUROSEMIDE 10 MG/ML IJ SOLN
100.0000 mg | Freq: Once | INTRAMUSCULAR | Status: AC
  Administered 2016-10-05: 100 mg via INTRAVENOUS
  Filled 2016-10-05: qty 10

## 2016-10-05 MED ORDER — POTASSIUM CHLORIDE CRYS ER 20 MEQ PO TBCR
40.0000 meq | EXTENDED_RELEASE_TABLET | Freq: Once | ORAL | Status: AC
  Administered 2016-10-05: 40 meq via ORAL
  Filled 2016-10-05: qty 2

## 2016-10-05 NOTE — ED Notes (Addendum)
Ambulated pt to the bathroom with no assistance and no difficulty. O2 was between 95% and 96%. Pt came out of the bathroom and placed back on pulse ox and O2 was at 88%. Pt says he is short of breath and feels like he did when he came in. BaneberryJessica RN notified

## 2016-10-05 NOTE — ED Provider Notes (Signed)
[facility] Provider Note   CSN: [id number] Arrival date & time: 10/05/16  1436     History   Chief Complaint Chief Complaint  Patient presents with  . Shortness of Breath    HPI [name] is a 61 y.o. male.  61 yo M with a chief complaint of shortness of breath. Going on for the past couple days. Feel that he has been gaining some weight as well. Has a history of CHF. Has been taking his Lasix as prescribed. Has been taking his extra dose at night but has not been having a significant urine output. Has been able to get around fine at home but feels that his stomach is bloated. He went to cardiac rehabilitation today and was able to do it however he complained about his increased weight gain and his increased abdominal distention. They gave him the option to follow up or go to the ED if he felt like it got worse. He decided to come to the ED. He denies dietary noncompliance.   The history is provided by the patient.  Shortness of Breath  This is a new problem. The average episode lasts 2 days. The problem occurs continuously.The current episode started less than 1 hour ago. The problem has not changed since onset.Pertinent negatives include no fever, no headaches, no chest pain, no vomiting, no abdominal pain and no rash. He has tried nothing for the symptoms. The treatment provided no relief. He has had no prior hospitalizations. He has had no prior ED visits. He has had no prior ICU admissions. Associated medical issues include heart failure.    Past Medical History:  Diagnosis Date  . Anxiety   . Automatic implantable cardioverter-defibrillator in situ    a. Medtronic ICD.  [name]. CAD (coronary artery disease)    a.  Severe LAD stenosis 2/2 acute thrombus - BMS 2009;  b. 06/01/11 Cath - LAD 20 isr, 3135m (3.0x16 Veri-flex BMS), OM1 100p, EF 20-25%;  c. 07/2012 Abnl Cardiolite;  d. 08/2012 Cath/PCI: LM nl, LAD patent stents, D2 80-90 (jailed), LCX 90 p/m (4.0x24 Promus Premier  DES), OM1 100, OM2 80-90p (3.0x20 Promus Premier DES), RCA patent mid stent, 40d, EF 25%.  e. 10/29/15 LHC stable disease with patent stents  . Chronic systolic CHF (congestive heart failure), NYHA class 3 (HCC) - low output state 12/12/2014  . Depression   . Dyspnea   . History of rheumatic fever 1962  . HLD (hyperlipidemia) mixed  . HTN (hypertension)   . Ischemic cardiomyopathy    a.  EF 30-35% 2010;  b.  EF 20-25% by LV gram 08/2012. c. EF 15% by echo 11/2014.  [name]. Myocardial infarction [facility](HCC) 1998; 2002; ~ 2010  . Paroxysmal ventricular tachycardia (HCC)    a. VFlutter  CL 210 msec  Rx shock 04/2011  . PVC's (premature ventricular contractions)   . RBBB   . TIA (transient ischemic attack)    a. Dx 11/2014, continued on ASA/Plavix.    Patient Active Problem List   Diagnosis Date Noted  . ICD (implantable cardioverter-defibrillator) infection (HCC)   . Palliative care encounter   . Bacteremia, coagulase-negative staphylococcal   . Pulmonary edema   . VF (ventricular fibrillation) (HCC) 01/27/2016  . Encounter for intubation   . DOE (dyspnea on exertion) 06/19/2015  . ICD (implantable cardioverter-defibrillator) in place   . H/O medication noncompliance   . Depression 04/09/2015  . Erectile dysfunction 04/09/2015  . RBBB   . PVC's (premature ventricular contractions)   .  Chronic systolic CHF (congestive heart failure), NYHA class 3 (HCC) - low output state 12/12/2014  . Generalized anxiety disorder 12/09/2014  . Major depressive disorder, recurrent episode (HCC) 12/09/2014  . TIA (transient ischemic attack)   . HLD (hyperlipidemia)   . Aphasia 05/04/2014  . CAD (coronary artery disease)   . Ischemic cardiomyopathy   . Automatic implantable cardioverter-defibrillator in situ   . TOBACCO ABUSE 02/04/2010  . VENTRICULAR TACHYCARDIA 08/21/2008  . HYPERLIPIDEMIA-MIXED 06/04/2008  . Anxiety state 06/04/2008  . Essential hypertension 06/04/2008  . Cardiomyopathy, ischemic  06/04/2008  . CHEST PAIN-UNSPECIFIED 06/04/2008    Past Surgical History:  Procedure Laterality Date  . CARDIAC CATHETERIZATION N/A 10/29/2015   Procedure: Right/Left Heart Cath and Coronary Angiography;  Surgeon: Peter M Swaziland, MD;  Location: [facility] INVASIVE CV LAB;  Service: Cardiovascular;  Laterality: N/A;  . CARDIAC CATHETERIZATION N/A 03/20/2016   Procedure: Right Heart Cath;  Surgeon: Laurey Morale, MD;  Location: [facility] INVASIVE CV LAB;  Service: Cardiovascular;  Laterality: N/A;  . CARDIAC DEFIBRILLATOR PLACEMENT     MDT single chamber primary prevention ICD implanted by Dr Graciela Husbands as part of MASTER study  . EP IMPLANTABLE DEVICE N/A 12/11/2015   Procedure: BiV Upgrade;  Surgeon: [name], MD;  Location: [facility] INVASIVE CV LAB;  Service: Cardiovascular;  Laterality: N/A;  . EP IMPLANTABLE DEVICE N/A 12/11/2015   Procedure: Pocket Revision/Relocation;  Surgeon: [name], MD;  Location: [facility] INVASIVE CV LAB;  Service: Cardiovascular;  Laterality: N/A;  . EP IMPLANTABLE DEVICE N/A 12/11/2015   Procedure: Lead Revision/Repair;  Surgeon: [name], MD;  Location: [facility] INVASIVE CV LAB;  Service: Cardiovascular;  Laterality: N/A;  . EP IMPLANTABLE DEVICE N/A 03/23/2016   Procedure: Temporary placement of pacemaker lead and generator;  Surgeon: Marinus Maw, MD;  Location: [facility] OR;  Service: Cardiovascular;  Laterality: N/A;  . EP IMPLANTABLE DEVICE N/A 03/27/2016   Procedure: ICD Implant;  Surgeon: [name], MD;  Location: [facility] INVASIVE CV LAB;  Service: Cardiovascular;  Laterality: N/A;  . GENERATOR REMOVAL N/A 03/23/2016   Procedure: DEVICE REMOVAL;  Surgeon: Marinus Maw, MD;  Location: [facility] OR;  Service: Cardiovascular;  Laterality: N/A;  PVT to back up  . HERNIA REPAIR     "w/gallbladder OR" (08/29/2012)  . IR GENERIC HISTORICAL  03/31/2016   IR US GUIDE VASC ACCESS LEFT 03/31/2016 Simonne Come, MD [facility]  . IR GENERIC HISTORICAL  03/31/2016   IR FLUORO GUIDE CV MIDLINE PICC LEFT  03/31/2016 Simonne Come, MD [facility]  . LAPAROSCOPIC CHOLECYSTECTOMY    . LEFT HEART CATHETERIZATION WITH CORONARY ANGIOGRAM N/A 06/08/2011   Procedure: LEFT HEART CATHETERIZATION WITH CORONARY ANGIOGRAM;  Surgeon: Dolores Patty, MD;  Location: [facility] CATH LAB;  Service: Cardiovascular;  Laterality: N/A;  . PERCUTANEOUS CORONARY STENT INTERVENTION (PCI-S) N/A 06/01/2011   Procedure: PERCUTANEOUS CORONARY STENT INTERVENTION (PCI-S);  Surgeon: Peter M Swaziland, MD;  Location: [facility] CATH LAB;  Service: Cardiovascular;  Laterality: N/A;  . PERCUTANEOUS CORONARY STENT INTERVENTION (PCI-S) N/A 08/29/2012   Procedure: PERCUTANEOUS CORONARY STENT INTERVENTION (PCI-S);  Surgeon: Peter M Swaziland, MD;  Location: [facility] CATH LAB;  Service: Cardiovascular;  Laterality: N/A;  . PERIPHERAL VASCULAR CATHETERIZATION  12/11/2015   Procedure: Upper Extremity Venography;  Surgeon: [name], MD;  Location: [facility] INVASIVE CV LAB;  Service: Cardiovascular;;  . TEE WITHOUT CARDIOVERSION N/A 03/20/2016   Procedure: TRANSESOPHAGEAL ECHOCARDIOGRAM (TEE);  Surgeon: Vesta Mixer, MD;  Location: [facility] ENDOSCOPY;  Service: Cardiovascular;  Laterality: N/A;  . TEE WITHOUT CARDIOVERSION N/A 03/23/2016   Procedure: TRANSESOPHAGEAL ECHOCARDIOGRAM (TEE);  Surgeon: Marinus Maw, MD;  Location: [facility] OR;  Service: Cardiovascular;  Laterality: N/A;       Home Medications    Prior to Admission medications   Medication Sig Start Date End Date Taking? Authorizing Provider  ALPRAZolam (XANAX) 0.25 MG tablet Take 1 tablet (0.25 mg total) by mouth at bedtime as needed for anxiety. F/u with PCP for refills. 02/21/16  Yes Laurey Morale, MD  amiodarone (PACERONE) 200 MG tablet TAKE 1 TABLET BY MOUTH EVERY DAY 08/18/16  Yes Laurey Morale, MD  aspirin EC 81 MG tablet Take 81 mg by mouth daily.   Yes [provider]  atorvastatin (LIPITOR) 40 MG tablet Take 1 tablet (40 mg total) by mouth daily. 03/03/16  Yes [name], MD    bisoprolol (ZEBETA) 5 MG tablet Take 0.5 tablets (2.5 mg total) by mouth at bedtime. 02/13/16  Yes [name], MD  clopidogrel (PLAVIX) 75 MG tablet Take 1 tablet (75 mg total) by mouth daily. 03/03/16  Yes [name], MD  digoxin (LANOXIN) 0.125 MG tablet Take one tablet (0.125 mg) by mouth once every other day 02/11/16  Yes [name], MD  famotidine (PEPCID) 20 MG tablet Take 1 tablet (20 mg total) by mouth 2 (two) times daily. 02/02/16  Yes Bhagat, Bhavinkumar, PA  furosemide (LASIX) 40 MG tablet Take 2 tablets (80 mg total) by mouth daily. May take extra 40 mg (1 tab) once daily for weight gain 3 lbs or more overnight. 09/09/16  Yes Clegg, Amy D, NP  hydrOXYzine (VISTARIL) 25 MG capsule Take 25 mg by mouth at bedtime as needed (sleep).   Yes [provider]  losartan (COZAAR) 25 MG tablet Take 1 tablet (25 mg total) by mouth daily. 09/16/16  Yes Laurey Morale, MD  magnesium oxide (MAG-OX) 400 (241.3 Mg) MG tablet Take 1 tablet (400 mg total) by mouth daily. 09/29/16  Yes [name], MD  mexiletine (MEXITIL) 200 MG capsule TAKE ONE CAPSULE BY MOUTH EVERY 12 HOURS 06/19/16  Yes Allred, Fayrene Fearing, MD  NITROSTAT 0.4 MG SL tablet PLACE 1 TABLET (0.4 MG TOTAL) UNDER THE TONGUE EVERY 5 (FIVE) MINUTES AS NEEDED FOR CHEST PAIN 04/23/16  Yes [name], MD  PARoxetine (PAXIL) 20 MG tablet Take 1 tablet (20 mg total) by mouth daily. 07/07/16  Yes [name], MD  sildenafil (REVATIO) 20 MG tablet Take 1 tablet (20 mg total) by mouth as needed. 08/14/16  Yes Laurey Morale, MD  spironolactone (ALDACTONE) 25 MG tablet Take 12.5 mg by mouth daily.   Yes [provider]  triamcinolone cream (KENALOG) 0.1 % USE 1 APPLICATION TOPICALLY TO THE AFFECTED AREA TWICE A DAY AS NEEDED FOR ECZEMA 05/27/16  Yes [provider]    Family History Family History  Problem Relation Age of Onset  . Cancer Father   . Cancer Sister        twin sister and only one has cancer,  unknown what kind  . Hypertension Brother   . Cancer Unknown        family hx  . Heart attack Neg Hx   . Stroke Neg Hx     Social History Social History  Substance Use Topics  . Smoking status: Former Smoker    Packs/day: 1.00    Years: 47.00    Types: Cigarettes  Quit date: 11/20/2015  . Smokeless tobacco: Never Used  . Alcohol use 3.6 oz/week    6 Cans of beer per week     Allergies   Patient has no known allergies.   Review of Systems Review of Systems  Constitutional: Negative for chills and fever.  HENT: Negative for congestion and facial swelling.   Eyes: Negative for discharge and visual disturbance.  Respiratory: Positive for shortness of breath.   Cardiovascular: Negative for chest pain and palpitations.  Gastrointestinal: Negative for abdominal pain, diarrhea and vomiting.  Musculoskeletal: Negative for arthralgias and myalgias.  Skin: Negative for color change and rash.  Neurological: Negative for tremors, syncope and headaches.  Psychiatric/Behavioral: Negative for confusion and dysphoric mood.     Physical Exam Updated Vital Signs BP 118/72   Pulse 72   Temp 97.8 F (36.6 C) (Oral)   Resp (!) 21   SpO2 95%   Physical Exam  Constitutional: He is oriented to person, place, and time. He appears well-developed and well-nourished.  HENT:  Head: Normocephalic and atraumatic.  Eyes: Pupils are equal, round, and reactive to light. EOM are normal.  Neck: Normal range of motion. Neck supple. No JVD present.  Cardiovascular: Normal rate and regular rhythm.  Exam reveals no gallop and no friction rub.   Murmur (holosystolic) heard. Pulmonary/Chest: No respiratory distress. He has no wheezes. He has rales (in the bases).  JVD to mid neck  Abdominal: He exhibits no distension and no mass. There is no tenderness. There is no rebound and no guarding.  Musculoskeletal: Normal range of motion. He exhibits edema (trace). He exhibits no tenderness.    Neurological: He is alert and oriented to person, place, and time.  Skin: No rash noted. No pallor.  Psychiatric: He has a normal mood and affect. His behavior is normal.  Nursing note and vitals reviewed.    ED Treatments / Results  Labs (all labs ordered are listed, but only abnormal results are displayed) Labs Reviewed  BASIC METABOLIC PANEL - Abnormal; Notable for the following:       Result Value   BUN 25 (*)    Creatinine, Ser 1.55 (*)    GFR calc non Af Amer 46 (*)    GFR calc Af Amer 53 (*)    All other components within normal limits  CBC - Abnormal; Notable for the following:    RBC 3.82 (*)    Hemoglobin 12.4 (*)    HCT 37.2 (*)    All other components within normal limits  I-STAT TROPONIN, ED    EKG  EKG Interpretation  Date/Time:  Monday October 05 2016 14:40:38 EDT Ventricular Rate:  83 PR Interval:  90 QRS Duration: 210 QT Interval:  514 QTC Calculation: 603 R Axis:   -57 Text Interpretation:  Atrial-sensed ventricular-paced rhythm Biventricular pacemaker detected Abnormal ECG No significant change since last tracing Confirmed by Melene Plan [phone]) on 10/05/2016 7:04:34 PM       Radiology Dg Chest 2 View  Result Date: 10/05/2016 CLINICAL DATA:  3-4 days of shortness of breath with fluid buildup. History of CHF, former smoker. EXAM: CHEST  2 VIEW COMPARISON:  Chest x-ray of August 24, 2016 FINDINGS: The lungs are adequately inflated. The interstitial markings are increased. The heart is top-normal in size. The central pulmonary vascularity is engorged. A coronary stent is visible. The ICD is in stable position. There is calcification in the wall of the aortic arch. There is no pleural effusion. The bony  thorax exhibits no acute abnormality. IMPRESSION: Chronic bronchitic-smoking related changes. Superimposed low-grade CHF. No pleural effusion. Thoracic aortic atherosclerosis. Electronically Signed   By: David  Swaziland M.D.   On: 10/05/2016 15:10     Procedures Procedures (including critical care time)  Medications Ordered in ED Medications  furosemide (LASIX) 100 mg in dextrose 5 % 50 mL IVPB (0 mg Intravenous Stopped 10/05/16 2125)  potassium chloride SA (K-DUR,KLOR-CON) CR tablet 40 mEq (40 mEq Oral Given 10/05/16 2004)  magnesium oxide (MAG-OX) tablet 800 mg (800 mg Oral Given 10/05/16 2005)     Initial Impression / Assessment and Plan / ED Course  I have reviewed the triage vital signs and the nursing notes.  Pertinent labs & imaging results that were available during my care of the patient were reviewed by me and considered in my medical decision making (see chart for details).     61 yo M with a chief complaint of shortness of breath. Likely a CHF exacerbation based on history and physical.  Renal function is at baseline. Chest x-ray with very minimal fluid. Will ambulate in the ED. Give a dose of Lasix.  Ambulated with O2 sat down to 88%, suspect at his baseline with significant hx of smoking.  Discussed risk and benefits of admission with patient, will trial outpatient therapy, call his cardiologist in the morning.   9:32 PM:  I have discussed the diagnosis/risks/treatment options with the patient and family and believe the pt to be eligible for discharge home to follow-up with Cards. We also discussed returning to the ED immediately if new or worsening sx occur. We discussed the sx which are most concerning (e.g., sudden worsening sob, chest pain) that necessitate immediate return. Medications administered to the patient during their visit and any new prescriptions provided to the patient are listed below.  Medications given during this visit Medications  furosemide (LASIX) 100 mg in dextrose 5 % 50 mL IVPB (0 mg Intravenous Stopped 10/05/16 2125)  potassium chloride SA (K-DUR,KLOR-CON) CR tablet 40 mEq (40 mEq Oral Given 10/05/16 2004)  magnesium oxide (MAG-OX) tablet 800 mg (800 mg Oral Given 10/05/16 2005)     The  patient appears reasonably screen and/or stabilized for discharge and I doubt any other medical condition or other [facility] requiring further screening, evaluation, or treatment in the ED at this time prior to discharge.    Final Clinical Impressions(s) / ED Diagnoses   Final diagnoses:  Acute on chronic systolic congestive heart failure [facility])    New Prescriptions New Prescriptions   No medications on file     Melene Plan, DO 10/05/16 2132

## 2016-10-05 NOTE — ED Triage Notes (Signed)
Pt presents with c/o shortness of breath. The shortness of breath began about three days ago. He reports about 10 pounds weight gain over the past 3-4 days. He denies fevers, weakness, pain, cough, edema. He was sent to ED for further care from cardiac rehab today due to shortness of breath

## 2016-10-05 NOTE — Telephone Encounter (Signed)
Cardiac rehab called to let us know that pt arrived today c/o of increased SOB and wt gain.  They state pt has made adjustments to his Lasix but it has not helped any and he feels like he may need to go to Er for further eval, advised if pt feels that bad should go to ER, she will advise pt

## 2016-10-05 NOTE — Discharge Instructions (Signed)
Call your doc tomorrow.  Continue to take your lasix.  Return for worsening sob.

## 2016-10-05 NOTE — Progress Notes (Signed)
Patient arrived to Cardiac rehab department at about 1400 this afternoon with complaints of shortness of breath. He shared that he felt as though he is holding fluid and unable to exercise. Contacted Heart Failure clinic to inform them of his symptoms. VS check: BP 102/68  SpO2 96% room air  HR = 83 bpm, weight 101.0kg.  Patient felt he needed to go to ED as he had taken his as needed fluid medications and that did not help.  He shared his shortness of breath had worsened in the last 3 to 4 hours.  Transported to ED via wheelchair without incident, wife present.

## 2016-10-07 ENCOUNTER — Ambulatory Visit (HOSPITAL_COMMUNITY): Payer: Medicaid Other | Attending: Cardiovascular Disease

## 2016-10-07 ENCOUNTER — Telehealth (HOSPITAL_COMMUNITY): Payer: Self-pay | Admitting: Physician Assistant

## 2016-10-07 ENCOUNTER — Emergency Department (HOSPITAL_COMMUNITY)
Admission: EM | Admit: 2016-10-07 | Discharge: 2016-10-08 | Disposition: A | Discharge status: Home / Self Care | Payer: Medicaid Other | Attending: Emergency Medicine | Admitting: Emergency Medicine

## 2016-10-07 ENCOUNTER — Ambulatory Visit (INDEPENDENT_AMBULATORY_CARE_PROVIDER_SITE_OTHER): Payer: Medicaid Other | Admitting: *Deleted

## 2016-10-07 ENCOUNTER — Other Ambulatory Visit: Payer: Self-pay

## 2016-10-07 ENCOUNTER — Encounter (HOSPITAL_COMMUNITY)
Admission: RE | Admit: 2016-10-07 | Discharge: 2016-10-07 | Disposition: A | Discharge status: Home / Self Care | Payer: Medicaid Other | Source: Ambulatory Visit

## 2016-10-07 ENCOUNTER — Emergency Department (HOSPITAL_COMMUNITY): Payer: Medicaid Other

## 2016-10-07 ENCOUNTER — Encounter (HOSPITAL_COMMUNITY): Payer: Self-pay | Admitting: Emergency Medicine

## 2016-10-07 DIAGNOSIS — R079 Chest pain, unspecified: Secondary | ICD-10-CM | POA: Insufficient documentation

## 2016-10-07 DIAGNOSIS — I5022 Chronic systolic (congestive) heart failure: Secondary | ICD-10-CM | POA: Diagnosis not present

## 2016-10-07 DIAGNOSIS — I255 Ischemic cardiomyopathy: Secondary | ICD-10-CM | POA: Diagnosis not present

## 2016-10-07 DIAGNOSIS — Z95810 Presence of automatic (implantable) cardiac defibrillator: Secondary | ICD-10-CM | POA: Insufficient documentation

## 2016-10-07 DIAGNOSIS — R0602 Shortness of breath: Secondary | ICD-10-CM | POA: Diagnosis not present

## 2016-10-07 DIAGNOSIS — I2510 Atherosclerotic heart disease of native coronary artery without angina pectoris: Secondary | ICD-10-CM | POA: Insufficient documentation

## 2016-10-07 DIAGNOSIS — I080 Rheumatic disorders of both mitral and aortic valves: Secondary | ICD-10-CM | POA: Diagnosis not present

## 2016-10-07 DIAGNOSIS — Z87891 Personal history of nicotine dependence: Secondary | ICD-10-CM | POA: Insufficient documentation

## 2016-10-07 DIAGNOSIS — I110 Hypertensive heart disease with heart failure: Secondary | ICD-10-CM | POA: Insufficient documentation

## 2016-10-07 DIAGNOSIS — Z8673 Personal history of transient ischemic attack (TIA), and cerebral infarction without residual deficits: Secondary | ICD-10-CM | POA: Diagnosis not present

## 2016-10-07 DIAGNOSIS — Z79899 Other long term (current) drug therapy: Secondary | ICD-10-CM | POA: Diagnosis not present

## 2016-10-07 DIAGNOSIS — Z7982 Long term (current) use of aspirin: Secondary | ICD-10-CM | POA: Insufficient documentation

## 2016-10-07 DIAGNOSIS — Z7902 Long term (current) use of antithrombotics/antiplatelets: Secondary | ICD-10-CM | POA: Diagnosis not present

## 2016-10-07 LAB — BASIC METABOLIC PANEL
ANION GAP: 9 (ref 5–15)
BUN: 29 mg/dL — ABNORMAL HIGH (ref 6–20)
CHLORIDE: 104 mmol/L (ref 101–111)
CO2: 26 mmol/L (ref 22–32)
Calcium: 9.2 mg/dL (ref 8.9–10.3)
Creatinine, Ser: 1.57 mg/dL — ABNORMAL HIGH (ref 0.61–1.24)
GFR calc Af Amer: 52 mL/min — ABNORMAL LOW (ref >60)
GFR, EST NON AFRICAN AMERICAN: 45 mL/min — AB (ref >60)
GLUCOSE: 96 mg/dL (ref 65–99)
POTASSIUM: 4.1 mmol/L (ref 3.5–5.1)
SODIUM: 139 mmol/L (ref 135–145)

## 2016-10-07 LAB — CBC
HEMATOCRIT: 35.4 % — AB (ref 39.0–52.0)
HEMOGLOBIN: 11.4 g/dL — AB (ref 13.0–17.0)
MCH: 30.9 pg (ref 26.0–34.0)
MCHC: 32.2 g/dL (ref 30.0–36.0)
MCV: 95.9 fL (ref 78.0–100.0)
Platelets: 247 10*3/uL (ref 150–400)
RBC: 3.69 MIL/uL — ABNORMAL LOW (ref 4.22–5.81)
RDW: 14.9 % (ref 11.5–15.5)
WBC: 8.9 10*3/uL (ref 4.0–10.5)

## 2016-10-07 LAB — I-STAT TROPONIN, ED: Troponin i, poc: 0 ng/mL (ref 0.00–0.08)

## 2016-10-07 LAB — BRAIN NATRIURETIC PEPTIDE: B NATRIURETIC PEPTIDE 5: 809.6 pg/mL — AB (ref 0.0–100.0)

## 2016-10-07 MED ORDER — NITROGLYCERIN 0.4 MG SL SUBL
0.4000 mg | SUBLINGUAL_TABLET | SUBLINGUAL | Status: DC | PRN
  Administered 2016-10-07 (×2): 0.4 mg via SUBLINGUAL
  Filled 2016-10-07: qty 1

## 2016-10-07 MED ORDER — MORPHINE SULFATE (PF) 4 MG/ML IV SOLN
4.0000 mg | Freq: Once | INTRAVENOUS | Status: AC
  Administered 2016-10-07: 4 mg via INTRAVENOUS
  Filled 2016-10-07: qty 1

## 2016-10-07 MED ORDER — PERFLUTREN LIPID MICROSPHERE
1.0000 mL | INTRAVENOUS | Status: AC | PRN
  Administered 2016-10-07: 2 mL via INTRAVENOUS

## 2016-10-07 MED ORDER — ONDANSETRON HCL 4 MG/2ML IJ SOLN
4.0000 mg | Freq: Once | INTRAMUSCULAR | Status: AC
  Administered 2016-10-07: 4 mg via INTRAVENOUS
  Filled 2016-10-07: qty 2

## 2016-10-07 NOTE — Consult Note (Signed)
Cardiology Consultation   Patient ID: [name]; [id number]; 1955-09-02   Admit date: 10/07/2016 Date of Consult: 10/07/2016  Referring Provider:  ED  Primary Care Provider: Lonie Peak, PA-C Cardiologist: Shirlee Latch Electrophysiologist:  NA  Reason for Consultation: CP  History of Present Illness: [name] is a 61 y.o. male who is being seen today for the evaluation of CP at the request of the emergency dept. This gentleman has h/o CAD and multiple PCI, most recent cath in Dec of 2017 showing occluded OM1 (not a target for intervention), patent prior stents, Small to moderate D2 with ostial 90% stenosis (D2 originates from the stented segment of proximal LAD so likely jailed).  Essentially no change from prior cath.  He was just in the ED two days ago c/o SOB; he got a dose of IV lasix and was sent home. He returned to the ED tonight, again c/o SOB and DOE but this time saying that he had an associated feeling of pressure in the center of his chest. This occurred around 5pm this evening and had onset at rest. No associated nausea or diaphoresis. He did not try taking NTG to relieve the pain, but thinks it did get a little better w/ NTG in the ED.  Trop was negative, repeat CXR actually shows improvement since 2 days ago, and EKG is paced (unchanged). No acute changes.  Past Medical History:  Diagnosis Date  . Anxiety   . Automatic implantable cardioverter-defibrillator in situ    a. Medtronic ICD.  [name] CAD (coronary artery disease)    a.  Severe LAD stenosis 2/2 acute thrombus - BMS 2009;  b. 06/01/11 Cath - LAD 20 isr, 8m (3.0x16 Veri-flex BMS), OM1 100p, EF 20-25%;  c. 07/2012 Abnl Cardiolite;  d. 08/2012 Cath/PCI: LM nl, LAD patent stents, D2 80-90 (jailed), LCX 90 p/m (4.0x24 Promus Premier DES), OM1 100, OM2 80-90p (3.0x20 Promus Premier DES), RCA patent mid stent, 40d, EF 25%.  e. 10/29/15 LHC stable disease with patent stents  . Chronic systolic CHF (congestive heart failure), NYHA  class 3 (HCC) - low output state 12/12/2014  . Depression   . Dyspnea   . History of rheumatic fever 1962  . HLD (hyperlipidemia) mixed  . HTN (hypertension)   . Ischemic cardiomyopathy    a.  EF 30-35% 2010;  b.  EF 20-25% by LV gram 08/2012. c. EF 15% by echo 11/2014.  [name] Myocardial infarction [facility]) 1998; 2002; ~ 2010  . Paroxysmal ventricular tachycardia (HCC)    a. VFlutter  CL 210 msec  Rx shock 04/2011  . PVC's (premature ventricular contractions)   . RBBB   . TIA (transient ischemic attack)    a. Dx 11/2014, continued on ASA/Plavix.    Past Surgical History:  Procedure Laterality Date  . CARDIAC CATHETERIZATION N/A 10/29/2015   Procedure: Right/Left Heart Cath and Coronary Angiography;  Surgeon: Peter M Swaziland, MD;  Location: [facility] INVASIVE CV LAB;  Service: Cardiovascular;  Laterality: N/A;  . CARDIAC CATHETERIZATION N/A 03/20/2016   Procedure: Right Heart Cath;  Surgeon: Laurey Morale, MD;  Location: [facility] INVASIVE CV LAB;  Service: Cardiovascular;  Laterality: N/A;  . CARDIAC DEFIBRILLATOR PLACEMENT     MDT single chamber primary prevention ICD implanted by Dr Graciela Husbands as part of MASTER study  . EP IMPLANTABLE DEVICE N/A 12/11/2015   Procedure: BiV Upgrade;  Surgeon: [name], MD;  Location: [facility] INVASIVE CV LAB;  Service: Cardiovascular;  Laterality: N/A;  . EP IMPLANTABLE  DEVICE N/A 12/11/2015   Procedure: Pocket Revision/Relocation;  Surgeon: [name], MD;  Location: [facility] INVASIVE CV LAB;  Service: Cardiovascular;  Laterality: N/A;  . EP IMPLANTABLE DEVICE N/A 12/11/2015   Procedure: Lead Revision/Repair;  Surgeon: [name], MD;  Location: [facility] INVASIVE CV LAB;  Service: Cardiovascular;  Laterality: N/A;  . EP IMPLANTABLE DEVICE N/A 03/23/2016   Procedure: Temporary placement of pacemaker lead and generator;  Surgeon: Marinus Maw, MD;  Location: [facility] OR;  Service: Cardiovascular;  Laterality: N/A;  . EP IMPLANTABLE DEVICE N/A 03/27/2016   Procedure: ICD Implant;  Surgeon:  [name], MD;  Location: [facility] INVASIVE CV LAB;  Service: Cardiovascular;  Laterality: N/A;  . GENERATOR REMOVAL N/A 03/23/2016   Procedure: DEVICE REMOVAL;  Surgeon: Marinus Maw, MD;  Location: [facility] OR;  Service: Cardiovascular;  Laterality: N/A;  PVT to back up  . HERNIA REPAIR     "w/gallbladder OR" (08/29/2012)  . IR GENERIC HISTORICAL  03/31/2016   IR US GUIDE VASC ACCESS LEFT 03/31/2016 Simonne Come, MD [facility]-INTERV RAD  . IR GENERIC HISTORICAL  03/31/2016   IR FLUORO GUIDE CV MIDLINE PICC LEFT 03/31/2016 Simonne Come, MD [facility]INTER[facility]  . LAPAROSCOPIC CHOLECYSTECTOMY    . LEFT HEART CATHETERIZATION WITH CORONARY ANGIOGRAM N/A 06/08/2011   Procedure: LEFT HEART CATHETERIZATION WITH CORONARY ANGIOGRAM;  Surgeon: Dolores Patty, MD;  Location: [facility] CATH LAB;  Service: Cardiovascular;  Laterality: N/A;  . PERCUTANEOUS CORONARY STENT INTERVENTION (PCI-S) N/A 06/01/2011   Procedure: PERCUTANEOUS CORONARY STENT INTERVENTION (PCI-S);  Surgeon: Peter M Swaziland, MD;  Location: [facility] CATH LAB;  Service: Cardiovascular;  Laterality: N/A;  . PERCUTANEOUS CORONARY STENT INTERVENTION (PCI-S) N/A 08/29/2012   Procedure: PERCUTANEOUS CORONARY STENT INTERVENTION (PCI-S);  Surgeon: Peter M Swaziland, MD;  Location: [facility] CATH LAB;  Service: Cardiovascular;  Laterality: N/A;  . PERIPHERAL VASCULAR CATHETERIZATION  12/11/2015   Procedure: Upper Extremity Venography;  Surgeon: [name], MD;  Location: [facility] INVASIVE CV LAB;  Service: Cardiovascular;;  . TEE WITHOUT CARDIOVERSION N/A 03/20/2016   Procedure: TRANSESOPHAGEAL ECHOCARDIOGRAM (TEE);  Surgeon: Vesta Mixer, MD;  Location: [facility] ENDOSCOPY;  Service: Cardiovascular;  Laterality: N/A;  . TEE WITHOUT CARDIOVERSION N/A 03/23/2016   Procedure: TRANSESOPHAGEAL ECHOCARDIOGRAM (TEE);  Surgeon: Marinus Maw, MD;  Location: [facility] OR;  Service: Cardiovascular;  Laterality: N/A;      Current Medications: No current facility-administered medications on file prior to encounter.     Current Outpatient Prescriptions on File Prior to Encounter  Medication Sig Dispense Refill  . ALPRAZolam (XANAX) 0.25 MG tablet Take 1 tablet (0.25 mg total) by mouth at bedtime as needed for anxiety. F/u with PCP for refills. 30 tablet 0  . amiodarone (PACERONE) 200 MG tablet TAKE 1 TABLET BY MOUTH EVERY DAY 90 tablet 3  . aspirin EC 81 MG tablet Take 81 mg by mouth daily.    [name] atorvastatin (LIPITOR) 40 MG tablet Take 1 tablet (40 mg total) by mouth daily. 30 tablet 5  . bisoprolol (ZEBETA) 5 MG tablet Take 0.5 tablets (2.5 mg total) by mouth at bedtime. 15 tablet 11  . clopidogrel (PLAVIX) 75 MG tablet Take 1 tablet (75 mg total) by mouth daily. 30 tablet 5  . digoxin (LANOXIN) 0.125 MG tablet Take one tablet (0.125 mg) by mouth once every other day 30 tablet 3  . famotidine (PEPCID) 20 MG tablet Take 1 tablet (20 mg total) by mouth 2 (two) times daily. 60 tablet 6  . furosemide (  LASIX) 40 MG tablet Take 2 tablets (80 mg total) by mouth daily. May take extra 40 mg (1 tab) once daily for weight gain 3 lbs or more overnight. 90 tablet 6  . hydrOXYzine (VISTARIL) 25 MG capsule Take 25 mg by mouth at bedtime as needed (sleep).    . losartan (COZAAR) 25 MG tablet Take 1 tablet (25 mg total) by mouth daily. 30 tablet 3  . magnesium oxide (MAG-OX) 400 (241.3 Mg) MG tablet Take 1 tablet (400 mg total) by mouth daily. 90 tablet 2  . mexiletine (MEXITIL) 200 MG capsule TAKE ONE CAPSULE BY MOUTH EVERY 12 HOURS 60 capsule 3  . NITROSTAT 0.4 MG SL tablet PLACE 1 TABLET (0.4 MG TOTAL) UNDER THE TONGUE EVERY 5 (FIVE) MINUTES AS NEEDED FOR CHEST PAIN 25 tablet 6  . PARoxetine (PAXIL) 20 MG tablet Take 1 tablet (20 mg total) by mouth daily. 30 tablet 3  . sildenafil (REVATIO) 20 MG tablet Take 1 tablet (20 mg total) by mouth as needed. (Patient taking differently: Take 20 mg by mouth daily as needed (ED). ) 10 tablet 0  . spironolactone (ALDACTONE) 25 MG tablet Take 12.5 mg by mouth daily.    [name]  triamcinolone cream (KENALOG) 0.1 % USE 1 APPLICATION TOPICALLY TO THE AFFECTED AREA TWICE A DAY AS NEEDED FOR ECZEMA  5      Infused Medications:   PRN Medications: nitroGLYCERIN   Allergies:   No Known Allergies  Social History:   The patient  reports that he quit smoking about 10 months ago. His smoking use included Cigarettes. He has a 47.00 pack-year smoking history. He has never used smokeless tobacco. He reports that he drinks about 3.6 oz of alcohol per week . He reports that he does not use drugs.    Family History:   The patient's family history includes Cancer in his father, sister, and unknown relative; Hypertension in his brother.   ROS:  Please see the history of present illness.  All other ROS reviewed and negative.     Vital Signs: Blood pressure 105/63, pulse 73, temperature 98 F (36.7 C), temperature source Oral, resp. rate (!) 21, height 6' (1.829 m), weight 99.7 kg (219 lb 12.8 oz), SpO2 95 %.   PHYSICAL EXAM: General:  Well nourished, well developed, in no acute distress HEENT: normal Lymph: no adenopathy Neck: no JVD Endocrine:  No thryomegaly Vascular: No carotid bruits; FA pulses 2+ bilaterally without bruits  Cardiac:  normal S1, S2; RRR; 2/6 sys murmur along LSB  Lungs:  clear to auscultation bilaterally, no wheezing, rhonchi or rales  Abd: soft, nontender, no hepatomegaly  Ext: tr edema Musculoskeletal:  No deformities, BUE and BLE strength normal and equal Skin: warm and dry  Neuro:  CNs 2-12 intact, no focal abnormalities noted Psych:  Normal affect   EKG:  10-07-16 V-paced, unchanged from prior  Labs: No results for input(s): CKTOTAL, CKMB, TROPONINI in the last 72 hours.  Recent Labs  10/07/16 2115  TROPIPOC 0.00    Lab Results  Component Value Date   WBC 8.9 10/07/2016   HGB 11.4 (L) 10/07/2016   HCT 35.4 (L) 10/07/2016   MCV 95.9 10/07/2016   PLT 247 10/07/2016    Recent Labs Lab 10/07/16 2047  NA 139  K 4.1  CL 104  CO2  26  BUN 29*  CREATININE 1.57*  CALCIUM 9.2  GLUCOSE 96   Lab Results  Component Value Date   CHOL 122 12/07/2015  HDL 25 (L) 12/07/2015   LDLCALC 82 12/07/2015   TRIG 73 12/07/2015   Lab Results  Component Value Date   DDIMER 0.92 (H) 10/18/2015    Radiology/Studies:  Dg Chest 2 View  Result Date: 10/07/2016 CLINICAL DATA:  Chest pressure for several hours, initial encounter EXAM: CHEST  2 VIEW COMPARISON:  10/22/2016 FINDINGS: Cardiac shadow is mildly enlarged but stable. Defibrillator is again seen and stable. The lungs are well aerated bilaterally. Interstitial changes are seen but improved when compared with the prior exam. No sizable effusion is seen. No focal infiltrate is noted. IMPRESSION: Improving vascular congestion and interstitial edema. Electronically Signed   By: Alcide CleverMark  Lukens M.D.   On: 10/07/2016 21:40   Dg Chest 2 View  Result Date: 10/05/2016 CLINICAL DATA:  3-4 days of shortness of breath with fluid buildup. History of CHF, former smoker. EXAM: CHEST  2 VIEW COMPARISON:  Chest x-ray of August 24, 2016 FINDINGS: The lungs are adequately inflated. The interstitial markings are increased. The heart is top-normal in size. The central pulmonary vascularity is engorged. A coronary stent is visible. The ICD is in stable position. There is calcification in the wall of the aortic arch. There is no pleural effusion. The bony thorax exhibits no acute abnormality. IMPRESSION: Chronic bronchitic-smoking related changes. Superimposed low-grade CHF. No pleural effusion. Thoracic aortic atherosclerosis. Electronically Signed   By: David  SwazilandJordan M.D.   On: 10/05/2016 15:10    LHC Dec 2017 Conclusion   Left ventriculography not done.    Right coronary artery: Dominant.  There was a stent in the mid RCA with 30-40% in-stent restenosis.  Otherwise luminarl irregularities.   Left main: Short, no significant disease.   Left circumflex system: Occluded small to moderate OM1, no  change from prior cath. OM2 with 40 ostial stenosis.  Patent stent in proximal LCx.    Left anterior descending system: Patent long stented segment in the proximal LAD.  Small D1.  Small to moderate D2 with ostial 90% stenosis.  D2 originates from the stented segment of proximal LAD so likely jailed.  No change from prior cath.   Impression:  No change compared to prior coronary angiography in 9/17.  No target for intervention.   TTE today 10-07-16 - Left ventricle: The cavity size was severely dilated. Wall   thickness was increased in a pattern of moderate LVH. Systolic   function was severely reduced. The estimated ejection fraction   was in the range of 20% to 25%. Features are consistent with a   pseudonormal left ventricular filling pattern, with concomitant   abnormal relaxation and increased filling pressure (grade 2   diastolic dysfunction). - Aortic valve: There was moderate regurgitation. - Mitral valve: Mildly calcified annulus. There was moderate   regurgitation. - Left atrium: The atrium was severely dilated. - Right atrium: The atrium was moderately dilated. - Pulmonary arteries: Systolic pressure was moderately increased.   PA peak pressure: 49 mm Hg (S).  ASSESSMENT AND PLAN:  1. CP/SOB: pt has known CAD with most recent cath in Dec 2017 showing several lesions not amenable to revascularization that have been managed medically. He is not on chronic anti-anginal tx, and pain is relieved w/ NTG. Suggested ED discharge pt on trial of imdur 30mg  daily. He has f/u scheduled w/ primary cardiologist next week. He should keep that appt unless he feels he needs to be seen sooner in which case he can contact the clinic directly   2. ICM: pt seems  compensated/euvolemic from a volume standpoint. No need for admission at this time. Cont taking extra lasix as instructed by Dr. Shirlee Latch  3. HTN/dyslipidemia: cont home regimen as directed by primary MD/primary cardiologist  Thank you  for the opportunity to participate in the care of this very pleasant patient. Will follow. Please call w/ questions.   Precious Reel, MD , [facility] 10/08/16 12:53 AM

## 2016-10-07 NOTE — ED Provider Notes (Signed)
Medical screening examination/treatment/procedure(s) were conducted as a shared visit with non-physician practitioner(s) and myself.  I personally evaluated the patient during the encounter.   EKG Interpretation  Date/Time:  Wednesday October 07 2016 20:56:49 EDT Ventricular Rate:  77 PR Interval:    QRS Duration: 217 QT Interval:  486 QTC Calculation: 551 R Axis:   -59 Text Interpretation:  Sinus rhythm Consider left atrial enlargement Nonspecific IVCD with LAD LVH with secondary repolarization abnormality Baseline wander in lead(s) V4 No significant change since last tracing Confirmed by Lorre NickAllen, Maricia Scotti ([id number]) on 10/07/2016 10:16:5847 PM     61 year old male here complaining of persistent chest pain 5 hours. Does have a significant history of coronary artery disease and history of MIs. Denies any cough or congestion. Symptoms worse with lying flat. For his cardiac marker negative and discussed with cardiology and will do a marker. They will come and consult. EKG is unchanged.   Lorre NickAllen, Vasili Fok, MD 10/07/16 [phone]

## 2016-10-07 NOTE — ED Provider Notes (Signed)
[facility]EM[facility]ENCY DEPT Provider Note   CSN: [id number] Arrival date & time: 10/07/16  2038     History   Chief Complaint Chief Complaint  Patient presents with  . Chest Pain    HPI [name] is a 61 y.o. male.  HPI [name] is a 61 y.o. male presents to emergency department with history of anxiety, CHF, defibrillator in place, CAD, depression, hypertension presents to emergency room complaining of chest pain and shortness of breath. Patient states he has had increased shortness of breath over last several days. He was seen in emergency department 2 days ago, was thought to be in CHF, discharged home. He has seen his doctor and had a neck of performed today. He states that he developed chest pressure around 5 PM tonight which is what made him come back to the emergency department. Patient reports worsening shortness of breath with laying down on exertion. He is taking Lasix 80 mg in the morning and 40 at evening time, states he took both today. Denies any leg swelling but feels like there is fluid in his abdomen. Denies any cough or congestion. No fever. No other complaints. Took 4 baby aspirins prior to coming in.  Past Medical History:  Diagnosis Date  . Anxiety   . Automatic implantable cardioverter-defibrillator in situ    a. Medtronic ICD.  [name] CAD (coronary artery disease)    a.  Severe LAD stenosis 2/2 acute thrombus - BMS 2009;  b. 06/01/11 Cath - LAD 20 isr, 4m (3.0x16 Veri-flex BMS), OM1 100p, EF 20-25%;  c. 07/2012 Abnl Cardiolite;  d. 08/2012 Cath/PCI: LM nl, LAD patent stents, D2 80-90 (jailed), LCX 90 p/m (4.0x24 Promus Premier DES), OM1 100, OM2 80-90p (3.0x20 Promus Premier DES), RCA patent mid stent, 40d, EF 25%.  e. 10/29/15 LHC stable disease with patent stents  . Chronic systolic CHF (congestive heart failure), NYHA class 3 (HCC) - low output state 12/12/2014  . Depression   . Dyspnea   . History of rheumatic fever 1962  . HLD (hyperlipidemia) mixed  . HTN  (hypertension)   . Ischemic cardiomyopathy    a.  EF 30-35% 2010;  b.  EF 20-25% by LV gram 08/2012. c. EF 15% by echo 11/2014.  [name] Myocardial infarction [facility]) 1998; 2002; ~ 2010  . Paroxysmal ventricular tachycardia (HCC)    a. VFlutter  CL 210 msec  Rx shock 04/2011  . PVC's (premature ventricular contractions)   . RBBB   . TIA (transient ischemic attack)    a. Dx 11/2014, continued on ASA/Plavix.    Patient Active Problem List   Diagnosis Date Noted  . ICD (implantable cardioverter-defibrillator) infection (HCC)   . Palliative care encounter   . Bacteremia, coagulase-negative staphylococcal   . Pulmonary edema   . VF (ventricular fibrillation) (HCC) 01/27/2016  . Encounter for intubation   . DOE (dyspnea on exertion) 06/19/2015  . ICD (implantable cardioverter-defibrillator) in place   . H/O medication noncompliance   . Depression 04/09/2015  . Erectile dysfunction 04/09/2015  . RBBB   . PVC's (premature ventricular contractions)   . Chronic systolic CHF (congestive heart failure), NYHA class 3 (HCC) - low output state 12/12/2014  . Generalized anxiety disorder 12/09/2014  . Major depressive disorder, recurrent episode (HCC) 12/09/2014  . TIA (transient ischemic attack)   . HLD (hyperlipidemia)   . Aphasia 05/04/2014  . CAD (coronary artery disease)   . Ischemic cardiomyopathy   . Automatic implantable cardioverter-defibrillator in situ   .  TOBACCO ABUSE 02/04/2010  . VENTRICULAR TACHYCARDIA 08/21/2008  . HYPERLIPIDEMIA-MIXED 06/04/2008  . Anxiety state 06/04/2008  . Essential hypertension 06/04/2008  . Cardiomyopathy, ischemic 06/04/2008  . CHEST PAIN-UNSPECIFIED 06/04/2008    Past Surgical History:  Procedure Laterality Date  . CARDIAC CATHETERIZATION N/A 10/29/2015   Procedure: Right/Left Heart Cath and Coronary Angiography;  Surgeon: Peter M Swaziland, MD;  Location: [facility] INVASIVE CV LAB;  Service: Cardiovascular;  Laterality: N/A;  . CARDIAC CATHETERIZATION N/A  03/20/2016   Procedure: Right Heart Cath;  Surgeon: Laurey Morale, MD;  Location: [facility] INVASIVE CV LAB;  Service: Cardiovascular;  Laterality: N/A;  . CARDIAC DEFIBRILLATOR PLACEMENT     MDT single chamber primary prevention ICD implanted by Dr Graciela Husbands as part of MASTER study  . EP IMPLANTABLE DEVICE N/A 12/11/2015   Procedure: BiV Upgrade;  Surgeon: [name], MD;  Location: [facility] INVASIVE CV LAB;  Service: Cardiovascular;  Laterality: N/A;  . EP IMPLANTABLE DEVICE N/A 12/11/2015   Procedure: Pocket Revision/Relocation;  Surgeon: [name], MD;  Location: [facility] INVASIVE CV LAB;  Service: Cardiovascular;  Laterality: N/A;  . EP IMPLANTABLE DEVICE N/A 12/11/2015   Procedure: Lead Revision/Repair;  Surgeon: [name], MD;  Location: [facility] INVASIVE CV LAB;  Service: Cardiovascular;  Laterality: N/A;  . EP IMPLANTABLE DEVICE N/A 03/23/2016   Procedure: Temporary placement of pacemaker lead and generator;  Surgeon: Marinus Maw, MD;  Location: [facility] OR;  Service: Cardiovascular;  Laterality: N/A;  . EP IMPLANTABLE DEVICE N/A 03/27/2016   Procedure: ICD Implant;  Surgeon: [name], MD;  Location: [facility] INVASIVE CV LAB;  Service: Cardiovascular;  Laterality: N/A;  . GENERATOR REMOVAL N/A 03/23/2016   Procedure: DEVICE REMOVAL;  Surgeon: Marinus Maw, MD;  Location: [facility] OR;  Service: Cardiovascular;  Laterality: N/A;  PVT to back up  . HERNIA REPAIR     "w/gallbladder OR" (08/29/2012)  . IR GENERIC HISTORICAL  03/31/2016   IR US GUIDE VASC ACCESS LEFT 03/31/2016 Simonne Come, MD [facility]-INTERV RAD  . IR GENERIC HISTORICAL  03/31/2016   IR FLUORO GUIDE CV MIDLINE PICC LEFT 03/31/2016 Simonne Come, MD [facility]  . LAPAROSCOPIC CHOLECYSTECTOMY    . LEFT HEART CATHETERIZATION WITH CORONARY ANGIOGRAM N/A 06/08/2011   Procedure: LEFT HEART CATHETERIZATION WITH CORONARY ANGIOGRAM;  Surgeon: Dolores Patty, MD;  Location: [facility] CATH LAB;  Service: Cardiovascular;  Laterality: N/A;  . PERCUTANEOUS CORONARY STENT  INTERVENTION (PCI-S) N/A 06/01/2011   Procedure: PERCUTANEOUS CORONARY STENT INTERVENTION (PCI-S);  Surgeon: Peter M Swaziland, MD;  Location: [facility] CATH LAB;  Service: Cardiovascular;  Laterality: N/A;  . PERCUTANEOUS CORONARY STENT INTERVENTION (PCI-S) N/A 08/29/2012   Procedure: PERCUTANEOUS CORONARY STENT INTERVENTION (PCI-S);  Surgeon: Peter M Swaziland, MD;  Location: [facility] CATH LAB;  Service: Cardiovascular;  Laterality: N/A;  . PERIPHERAL VASCULAR CATHETERIZATION  12/11/2015   Procedure: Upper Extremity Venography;  Surgeon: [name], MD;  Location: [facility] INVASIVE CV LAB;  Service: Cardiovascular;;  . TEE WITHOUT CARDIOVERSION N/A 03/20/2016   Procedure: TRANSESOPHAGEAL ECHOCARDIOGRAM (TEE);  Surgeon: Vesta Mixer, MD;  Location: [facility] ENDOSCOPY;  Service: Cardiovascular;  Laterality: N/A;  . TEE WITHOUT CARDIOVERSION N/A 03/23/2016   Procedure: TRANSESOPHAGEAL ECHOCARDIOGRAM (TEE);  Surgeon: Marinus Maw, MD;  Location: [facility] OR;  Service: Cardiovascular;  Laterality: N/A;       Home Medications    Prior to Admission medications   Medication Sig Start Date End Date Taking? Authorizing Provider  ALPRAZolam Prudy Feeler) 0.25 MG tablet Take 1  tablet (0.25 mg total) by mouth at bedtime as needed for anxiety. F/u with PCP for refills. 02/21/16   Laurey Morale, MD  amiodarone (PACERONE) 200 MG tablet TAKE 1 TABLET BY MOUTH EVERY DAY 08/18/16   Laurey Morale, MD  aspirin EC 81 MG tablet Take 81 mg by mouth daily.    [provider]  atorvastatin (LIPITOR) 40 MG tablet Take 1 tablet (40 mg total) by mouth daily. 03/03/16   [name], MD  bisoprolol (ZEBETA) 5 MG tablet Take 0.5 tablets (2.5 mg total) by mouth at bedtime. 02/13/16   [name], MD  clopidogrel (PLAVIX) 75 MG tablet Take 1 tablet (75 mg total) by mouth daily. 03/03/16   [name], MD  digoxin (LANOXIN) 0.125 MG tablet Take one tablet (0.125 mg) by mouth once every other day 02/11/16   [name], MD    famotidine (PEPCID) 20 MG tablet Take 1 tablet (20 mg total) by mouth 2 (two) times daily. 02/02/16   Bhagat, Sharrell Ku, PA  furosemide (LASIX) 40 MG tablet Take 2 tablets (80 mg total) by mouth daily. May take extra 40 mg (1 tab) once daily for weight gain 3 lbs or more overnight. 09/09/16   Clegg, Amy D, NP  hydrOXYzine (VISTARIL) 25 MG capsule Take 25 mg by mouth at bedtime as needed (sleep).    [provider]  losartan (COZAAR) 25 MG tablet Take 1 tablet (25 mg total) by mouth daily. 09/16/16   Laurey Morale, MD  magnesium oxide (MAG-OX) 400 (241.3 Mg) MG tablet Take 1 tablet (400 mg total) by mouth daily. 09/29/16   [name], MD  mexiletine (MEXITIL) 200 MG capsule TAKE ONE CAPSULE BY MOUTH EVERY 12 HOURS 06/19/16   Allred, Fayrene Fearing, MD  NITROSTAT 0.4 MG SL tablet PLACE 1 TABLET (0.4 MG TOTAL) UNDER THE TONGUE EVERY 5 (FIVE) MINUTES AS NEEDED FOR CHEST PAIN 04/23/16   [name], MD  PARoxetine (PAXIL) 20 MG tablet Take 1 tablet (20 mg total) by mouth daily. 07/07/16   [name], MD  sildenafil (REVATIO) 20 MG tablet Take 1 tablet (20 mg total) by mouth as needed. 08/14/16   Laurey Morale, MD  spironolactone (ALDACTONE) 25 MG tablet Take 12.5 mg by mouth daily.    [provider]  triamcinolone cream (KENALOG) 0.1 % USE 1 APPLICATION TOPICALLY TO THE AFFECTED AREA TWICE A DAY AS NEEDED FOR ECZEMA 05/27/16   [provider]    Family History Family History  Problem Relation Age of Onset  . Cancer Father   . Cancer Sister        twin sister and only one has cancer, unknown what kind  . Hypertension Brother   . Cancer Unknown        family hx  . Heart attack Neg Hx   . Stroke Neg Hx     Social History Social History  Substance Use Topics  . Smoking status: Former Smoker    Packs/day: 1.00    Years: 47.00    Types: Cigarettes    Quit date: 11/20/2015  . Smokeless tobacco: Never Used  . Alcohol use 3.6 oz/week    6 Cans of beer per week      Allergies   Patient has no known allergies.   Review of Systems Review of Systems  Constitutional: Negative for chills and fever.  Respiratory: Positive for chest tightness and shortness of breath. Negative for cough.  Cardiovascular: Positive for chest pain. Negative for palpitations and leg swelling.  Gastrointestinal: Negative for abdominal distention, abdominal pain, diarrhea, nausea and vomiting.  Genitourinary: Negative for dysuria, frequency, hematuria and urgency.  Musculoskeletal: Negative for arthralgias, myalgias, neck pain and neck stiffness.  Skin: Negative for rash.  Allergic/Immunologic: Negative for immunocompromised state.  Neurological: Negative for dizziness, weakness, light-headedness, numbness and headaches.  All other systems reviewed and are negative.    Physical Exam Updated Vital Signs BP 101/66   Pulse 76   Temp 98 F (36.7 C) (Oral)   Resp 20   Ht 6' (1.829 m)   Wt 99.3 kg (219 lb)   SpO2 98%   BMI 29.70 kg/m   Physical Exam  Constitutional: He appears well-developed and well-nourished. No distress.  HENT:  Head: Normocephalic and atraumatic.  Eyes: Conjunctivae are normal.  Neck: Neck supple.  Cardiovascular: Normal rate, regular rhythm and normal heart sounds.   Pulmonary/Chest: Effort normal. No respiratory distress. He has no wheezes. He has rales.  Rales at bases bilaterally  Abdominal: Soft. Bowel sounds are normal. He exhibits no distension. There is no tenderness. There is no rebound.  Musculoskeletal: He exhibits no edema.  Neurological: He is alert.  Skin: Skin is warm and dry.  Nursing note and vitals reviewed.    ED Treatments / Results  Labs (all labs ordered are listed, but only abnormal results are displayed) Labs Reviewed  BASIC METABOLIC PANEL - Abnormal; Notable for the following:       Result Value   BUN 29 (*)    Creatinine, Ser 1.57 (*)    GFR calc non Af Amer 45 (*)    GFR calc Af Amer 52 (*)    All  other components within normal limits  CBC - Abnormal; Notable for the following:    RBC 3.69 (*)    Hemoglobin 11.4 (*)    HCT 35.4 (*)    All other components within normal limits  BRAIN NATRIURETIC PEPTIDE - Abnormal; Notable for the following:    B Natriuretic Peptide 809.6 (*)    All other components within normal limits  I-STAT TROPONIN, ED  I-STAT TROPONIN, ED    EKG  EKG Interpretation  Date/Time:  Wednesday October 07 2016 20:56:49 EDT Ventricular Rate:  77 PR Interval:    QRS Duration: 217 QT Interval:  486 QTC Calculation: 551 R Axis:   -59 Text Interpretation:  Sinus rhythm Consider left atrial enlargement Nonspecific IVCD with LAD LVH with secondary repolarization abnormality Baseline wander in lead(s) V4 No significant change since last tracing Confirmed by Lorre NickAllen, Anthony ([id number]) on 10/07/2016 10:16:47 PM       Radiology Dg Chest 2 View  Result Date: 10/07/2016 CLINICAL DATA:  Chest pressure for several hours, initial encounter EXAM: CHEST  2 VIEW COMPARISON:  10/22/2016 FINDINGS: Cardiac shadow is mildly enlarged but stable. Defibrillator is again seen and stable. The lungs are well aerated bilaterally. Interstitial changes are seen but improved when compared with the prior exam. No sizable effusion is seen. No focal infiltrate is noted. IMPRESSION: Improving vascular congestion and interstitial edema. Electronically Signed   By: Alcide CleverMark  Lukens M.D.   On: 10/07/2016 21:40    Procedures Procedures (including critical care time)  Medications Ordered in ED Medications  nitroGLYCERIN (NITROSTAT) SL tablet 0.4 mg (not administered)     Initial Impression / Assessment and Plan / ED Course  I have reviewed the triage vital signs and the nursing notes.  Pertinent labs &  imaging results that were available during my care of the patient were reviewed by me and considered in my medical decision making (see chart for details).     Patient in emergency department with  chest pain and shortness of breath. History of CHF and prior MI with stenting. Patient's vital signs are normal at this time. He just had a neck code done this morning which showed EF of 20-25% with grade 2 diastolic dysfunction. There was moderately decreased systolic pressure and pulmonary arteries. Patient states at that time he did not have any chest pain, pain started around 5 PM today. We'll check labs including troponin, BNP, chest x-ray, will monitor. Will give nitroglycerin.  Patient's pain did improve after nitroglycerin. Initial troponin is negative. Discussed patient's cardiology. Patient had unremarkable cardiac cath just a year ago. Sounds like he is followed closely by cardiology. He does not appear to be fluid overloaded at this time. We discussed monitor, getting second troponin, and possibly discharge home with follow-up tomorrow.  Spoke with Dr. Freida Busman, asked for official cardiology consult. Cardiology agreed to consult.  Repeat trop at 1am  Pt signed out at shift change to PA [facility] pending trop and cardiology consult.   Vitals:   10/07/16 2215 10/07/16 2226 10/07/16 2230 10/07/16 2330  BP: (!) 102/57  105/63 110/76  Pulse: 69  73 67  Resp: 20  (!) 21 (!) 22  Temp:      TempSrc:      SpO2: 93%  95% 96%  Weight:  99.7 kg (219 lb 12.8 oz)    Height:         Final  Clinical Impressions(s) / ED Diagnoses   Final diagnoses:  Chest pain, unspecified type  Shortness of breath    New Prescriptions New Prescriptions   No medications on file     Iona Coach 10/08/16 0021    Lorre Nick, MD 10/08/16 2209

## 2016-10-07 NOTE — ED Triage Notes (Signed)
Per EMS pt from home pt was here on Monday for similar event h/s of chf, ventricular paced, pt feels SOB and dyspnea, lungs clear, ECHO this am and today pt having chest pressure mid sternal, h/s of 3 MI's and 6 stents, 324 aspirin, BP 132/82, 98% O2

## 2016-10-08 ENCOUNTER — Telehealth (HOSPITAL_COMMUNITY): Payer: Self-pay | Admitting: *Deleted

## 2016-10-08 ENCOUNTER — Encounter: Payer: Self-pay | Admitting: *Deleted

## 2016-10-08 LAB — I-STAT TROPONIN, ED: TROPONIN I, POC: 0 ng/mL (ref 0.00–0.08)

## 2016-10-08 MED ORDER — ISOSORBIDE MONONITRATE ER 30 MG PO TB24: 30.0000 mg | ORAL_TABLET | Freq: Every day | ORAL | 0 refills | Status: DC

## 2016-10-08 MED ORDER — FUROSEMIDE 10 MG/ML IJ SOLN
60.0000 mg | Freq: Once | INTRAMUSCULAR | Status: AC
  Administered 2016-10-08: 60 mg via INTRAVENOUS
  Filled 2016-10-08: qty 6

## 2016-10-08 NOTE — Progress Notes (Signed)
Cardiac Individual Treatment Plan  Patient Details  Name: [name] MRN: [id number] Date of Birth: 07-15-1955 Referring Provider:     CARDIAC REHAB PHASE II ORIENTATION from 09/17/2016 in [facility] CARDIAC REHAB  Referring Provider  Marca Ancona MD      Initial Encounter Date:    CARDIAC REHAB PHASE II ORIENTATION from 09/17/2016 in [facility] CARDIAC REHAB  Date  09/17/16  Referring Provider  Marca Ancona MD      Visit Diagnosis: No diagnosis found.  Patient's Home Medications on Admission:  Current Outpatient Prescriptions:  .  ALPRAZolam (XANAX) 0.25 MG tablet, Take 1 tablet (0.25 mg total) by mouth at bedtime as needed for anxiety. F/u with PCP for refills., Disp: 30 tablet, Rfl: 0 .  amiodarone (PACERONE) 200 MG tablet, TAKE 1 TABLET BY MOUTH EVERY DAY, Disp: 90 tablet, Rfl: 3 .  aspirin EC 81 MG tablet, Take 81 mg by mouth daily., Disp: , Rfl:  .  atorvastatin (LIPITOR) 40 MG tablet, Take 1 tablet (40 mg total) by mouth daily., Disp: 30 tablet, Rfl: 5 .  bisoprolol (ZEBETA) 5 MG tablet, Take 0.5 tablets (2.5 mg total) by mouth at bedtime., Disp: 15 tablet, Rfl: 11 .  clopidogrel (PLAVIX) 75 MG tablet, Take 1 tablet (75 mg total) by mouth daily., Disp: 30 tablet, Rfl: 5 .  digoxin (LANOXIN) 0.125 MG tablet, Take one tablet (0.125 mg) by mouth once every other day, Disp: 30 tablet, Rfl: 3 .  famotidine (PEPCID) 20 MG tablet, Take 1 tablet (20 mg total) by mouth 2 (two) times daily., Disp: 60 tablet, Rfl: 6 .  furosemide (LASIX) 40 MG tablet, Take 2 tablets (80 mg total) by mouth daily. May take extra 40 mg (1 tab) once daily for weight gain 3 lbs or more overnight., Disp: 90 tablet, Rfl: 6 .  hydrOXYzine (VISTARIL) 25 MG capsule, Take 25 mg by mouth at bedtime as needed (sleep)., Disp: , Rfl:  .  isosorbide mononitrate (IMDUR) 30 MG 24 hr tablet, Take 1 tablet (30 mg total) by mouth daily., Disp: 15 tablet, Rfl: 0 .  losartan (COZAAR) 25  MG tablet, Take 1 tablet (25 mg total) by mouth daily., Disp: 30 tablet, Rfl: 3 .  magnesium oxide (MAG-OX) 400 (241.3 Mg) MG tablet, Take 1 tablet (400 mg total) by mouth daily., Disp: 90 tablet, Rfl: 2 .  mexiletine (MEXITIL) 200 MG capsule, TAKE ONE CAPSULE BY MOUTH EVERY 12 HOURS, Disp: 60 capsule, Rfl: 3 .  NITROSTAT 0.4 MG SL tablet, PLACE 1 TABLET (0.4 MG TOTAL) UNDER THE TONGUE EVERY 5 (FIVE) MINUTES AS NEEDED FOR CHEST PAIN, Disp: 25 tablet, Rfl: 6 .  PARoxetine (PAXIL) 20 MG tablet, Take 1 tablet (20 mg total) by mouth daily., Disp: 30 tablet, Rfl: 3 .  sildenafil (REVATIO) 20 MG tablet, Take 1 tablet (20 mg total) by mouth as needed. (Patient taking differently: Take 20 mg by mouth daily as needed (ED). ), Disp: 10 tablet, Rfl: 0 .  spironolactone (ALDACTONE) 25 MG tablet, Take 12.5 mg by mouth daily., Disp: , Rfl:  .  triamcinolone cream (KENALOG) 0.1 %, USE 1 APPLICATION TOPICALLY TO THE AFFECTED AREA TWICE A DAY AS NEEDED FOR ECZEMA, Disp: , Rfl: 5  Past Medical History: Past Medical History:  Diagnosis Date  . Anxiety   . Automatic implantable cardioverter-defibrillator in situ    a. Medtronic ICD.  [name] CAD (coronary artery disease)    a.  Severe LAD stenosis 2/2  acute thrombus - BMS 2009;  b. 06/01/11 Cath - LAD 20 isr, 17m (3.0x16 Veri-flex BMS), OM1 100p, EF 20-25%;  c. 07/2012 Abnl Cardiolite;  d. 08/2012 Cath/PCI: LM nl, LAD patent stents, D2 80-90 (jailed), LCX 90 p/m (4.0x24 Promus Premier DES), OM1 100, OM2 80-90p (3.0x20 Promus Premier DES), RCA patent mid stent, 40d, EF 25%.  e. 10/29/15 LHC stable disease with patent stents  . Chronic systolic CHF (congestive heart failure), NYHA class 3 (HCC) - low output state 12/12/2014  . Depression   . Dyspnea   . History of rheumatic fever 1962  . HLD (hyperlipidemia) mixed  . HTN (hypertension)   . Ischemic cardiomyopathy    a.  EF 30-35% 2010;  b.  EF 20-25% by LV gram 08/2012. c. EF 15% by echo 11/2014.  [name] Myocardial infarction  [facility]) 1998; 2002; ~ 2010  . Paroxysmal ventricular tachycardia (HCC)    a. VFlutter  CL 210 msec  Rx shock 04/2011  . PVC's (premature ventricular contractions)   . RBBB   . TIA (transient ischemic attack)    a. Dx 11/2014, continued on ASA/Plavix.    Tobacco Use: History  Smoking Status  . Former Smoker  . Packs/day: 1.00  . Years: 47.00  . Types: Cigarettes  . Quit date: 11/20/2015  Smokeless Tobacco  . Never Used    Labs: Recent Review Flowsheet Data    Labs for ITP Cardiac and Pulmonary Rehab Latest Ref Rng & Units 03/21/2016 03/22/2016 03/23/2016 03/23/2016 03/26/2016   Cholestrol 0 - 200 mg/dL - - - - -   LDLCALC 0 - 99 mg/dL - - - - -   LDLDIRECT mg/dL - - - - -   HDL >16 mg/dL - - - - -   Trlycerides <150 mg/dL - - - - -   Hemoglobin A1c 4.8 - 5.6 % - - - - -   PHART 7.350 - 7.450 - - - 7.487(H) -   PCO2ART 32.0 - 48.0 mmHg - - - 38.4 -   HCO3 20.0 - 28.0 mmol/L - - - 29.2(H) -   TCO2 0 - 100 mmol/L - - - 30 -   ACIDBASEDEF 0.0 - 2.0 mmol/L - - - - -   O2SAT % 60.3 64.9 59.3 100.0 98.8      Capillary Blood Glucose: Lab Results  Component Value Date   GLUCAP 103 (H) 03/27/2016   GLUCAP 88 03/24/2016   GLUCAP 98 03/21/2016   GLUCAP 97 01/28/2016   GLUCAP 102 (H) 12/09/2015     Exercise Target Goals:    Exercise Program Goal: Individual exercise prescription set with THRR, safety & activity barriers. Participant demonstrates ability to understand and report RPE using BORG scale, to self-measure pulse accurately, and to acknowledge the importance of the exercise prescription.  Exercise Prescription Goal: Starting with aerobic activity 30 plus minutes a day, 3 days per week for initial exercise prescription. Provide home exercise prescription and guidelines that participant acknowledges understanding prior to discharge.  Activity Barriers & Risk Stratification:     Activity Barriers & Cardiac Risk Stratification - 09/17/16 1359      Activity Barriers &  Cardiac Risk Stratification   Activity Barriers Joint Problems;Deconditioning;Muscular Weakness;Shortness of Breath;Assistive Device   Cardiac Risk Stratification High      6 Minute Walk:     6 Minute Walk    Row Name 09/17/16 1513         6 Minute Walk   Phase Initial  Distance 1440 feet     Walk Time 6 minutes     # of Rest Breaks 0     MPH 2.73     METS 3.1     RPE 14     Perceived Dyspnea  3     VO2 Peak 10.88     Symptoms Yes (comment)     Comments SOB     Resting HR 72 bpm     Resting BP 98/63     Max Ex. HR 97 bpm     Max Ex. BP 126/70     2 Minute Post BP 99/64        Oxygen Initial Assessment:   Oxygen Re-Evaluation:   Oxygen Discharge (Final Oxygen Re-Evaluation):   Initial Exercise Prescription:     Initial Exercise Prescription - 09/17/16 1500      Date of Initial Exercise RX and Referring Provider   Date 09/17/16   Referring Provider Marca Ancona MD     Treadmill   MPH 2.5   Grade 0   Minutes 10   METs 2.91     Bike   Level 0.7   Minutes 10   METs 2.32     NuStep   Level 3   SPM 80   Minutes 10   METs 2     Prescription Details   Frequency (times per week) 3   Duration Progress to 30 minutes of continuous aerobic without signs/symptoms of physical distress     Intensity   THRR 40-80% of Max Heartrate 63-126   Ratings of Perceived Exertion 11-15   Perceived Dyspnea 0-4     Progression   Progression Continue to progress workloads to maintain intensity without signs/symptoms of physical distress.     Resistance Training   Training Prescription Yes   Weight 3lbs   Reps 10-15      Perform Capillary Blood Glucose checks as needed.  Exercise Prescription Changes:     Exercise Prescription Changes    Row Name 10/05/16 1600             Response to Exercise   Blood Pressure (Admit) 100/54       Blood Pressure (Exercise) 118/70       Blood Pressure (Exit) 104/64       Heart Rate (Admit) 78 bpm       Heart  Rate (Exercise) 96 bpm       Heart Rate (Exit) 76 bpm       Rating of Perceived Exertion (Exercise) 14       Duration Progress to 45 minutes of aerobic exercise without signs/symptoms of physical distress       Intensity THRR unchanged         Progression   Progression Continue to progress workloads to maintain intensity without signs/symptoms of physical distress.         Resistance Training   Training Prescription Yes       Weight 3lbs       Reps 10-15         Treadmill   MPH 2.5       Grade 0       Minutes 10       METs 2.91         Bike   Level 0.7       Minutes 10       METs 2.32         NuStep   Level 3  SPM 80       Minutes 10       METs 22.1          Exercise Comments:     Exercise Comments    Row Name 10/05/16 1643           Exercise Comments Pt is off to a great start with exercise!          Exercise Goals and Review:     Exercise Goals    Row Name 09/17/16 1402             Exercise Goals   Increase Physical Activity Yes       Intervention Provide advice, education, support and counseling about physical activity/exercise needs.;Develop an individualized exercise prescription for aerobic and resistive training based on initial evaluation findings, risk stratification, comorbidities and participant's personal goals.       Expected Outcomes Achievement of increased cardiorespiratory fitness and enhanced flexibility, muscular endurance and strength shown through measurements of functional capacity and personal statement of participant.       Increase Strength and Stamina Yes  increase strength and heart function to avoid/delay heart transplant or LVAD       Intervention Provide advice, education, support and counseling about physical activity/exercise needs.;Develop an individualized exercise prescription for aerobic and resistive training based on initial evaluation findings, risk stratification, comorbidities and participant's personal goals.        Expected Outcomes Achievement of increased cardiorespiratory fitness and enhanced flexibility, muscular endurance and strength shown through measurements of functional capacity and personal statement of participant.          Exercise Goals Re-Evaluation :    Discharge Exercise Prescription (Final Exercise Prescription Changes):     Exercise Prescription Changes - 10/05/16 1600      Response to Exercise   Blood Pressure (Admit) 100/54   Blood Pressure (Exercise) 118/70   Blood Pressure (Exit) 104/64   Heart Rate (Admit) 78 bpm   Heart Rate (Exercise) 96 bpm   Heart Rate (Exit) 76 bpm   Rating of Perceived Exertion (Exercise) 14   Duration Progress to 45 minutes of aerobic exercise without signs/symptoms of physical distress   Intensity THRR unchanged     Progression   Progression Continue to progress workloads to maintain intensity without signs/symptoms of physical distress.     Resistance Training   Training Prescription Yes   Weight 3lbs   Reps 10-15     Treadmill   MPH 2.5   Grade 0   Minutes 10   METs 2.91     Bike   Level 0.7   Minutes 10   METs 2.32     NuStep   Level 3   SPM 80   Minutes 10   METs 22.1      Nutrition:  Target Goals: Understanding of nutrition guidelines, daily intake of sodium 1500mg , cholesterol 200mg , calories 30% from fat and 7% or less from saturated fats, daily to have 5 or more servings of fruits and vegetables.  Biometrics:     Pre Biometrics - 09/17/16 1518      Pre Biometrics   Waist Circumference 41 inches   Hip Circumference 42 inches   Waist to Hip Ratio 0.98 %   Triceps Skinfold 26 mm   % Body Fat 31 %   Grip Strength 45.5 kg   Flexibility --  LBP!   Single Leg Stand 20 seconds       Nutrition Therapy Plan and Nutrition  Goals:     Nutrition Therapy & Goals - 09/23/16 1417      Nutrition Therapy   Diet Therapeutic Lifestyle Changes     Personal Nutrition Goals   Nutrition Goal Wt loss of 1-2  lb/week to a wt loss goal of 6-24 lb at graduation from Cardiac Rehab   Personal Goal #2 Pt to identify and limit food sources of saturated fat, trans fat, and sodium     Intervention Plan   Intervention Prescribe, educate and counsel regarding individualized specific dietary modifications aiming towards targeted core components such as weight, hypertension, lipid management, diabetes, heart failure and other comorbidities.   Expected Outcomes Short Term Goal: Understand basic principles of dietary content, such as calories, fat, sodium, cholesterol and nutrients.;Long Term Goal: Adherence to prescribed nutrition plan.      Nutrition Discharge: Nutrition Scores:     Nutrition Assessments - 09/23/16 1417      MEDFICTS Scores   Pre Score 52      Nutrition Goals Re-Evaluation:   Nutrition Goals Re-Evaluation:   Nutrition Goals Discharge (Final Nutrition Goals Re-Evaluation):   Psychosocial: Target Goals: Acknowledge presence or absence of significant depression and/or stress, maximize coping skills, provide positive support system. Participant is able to verbalize types and ability to use techniques and skills needed for reducing stress and depression.  Initial Review & Psychosocial Screening:     Initial Psych Review & Screening - 09/17/16 1603      Initial Review   Current issues with Current Anxiety/Panic     Barriers   Psychosocial barriers to participate in program The patient should benefit from training in stress management and relaxation.      Quality of Life Scores:     Quality of Life - 09/17/16 1521      Quality of Life Scores   Health/Function Pre 17.6 %   Socioeconomic Pre 27 %   Psych/Spiritual Pre 27.43 %   Family Pre 28.5 %   GLOBAL Pre 22.88 %      PHQ-9: Recent Review Flowsheet Data    Depression screen [facility] 2/9 04/23/2016   Decreased Interest 0   Down, Depressed, Hopeless 0   PHQ - 2 Score 0     Interpretation of Total Score  Total Score  Depression Severity:  1-4 = Minimal depression, 5-9 = Mild depression, 10-14 = Moderate depression, 15-19 = Moderately severe depression, 20-27 = Severe depression   Psychosocial Evaluation and Intervention:   Psychosocial Re-Evaluation:     Psychosocial Re-Evaluation    Row Name 10/08/16 1242             Psychosocial Re-Evaluation   Current issues with Current Stress Concerns       Interventions Encouraged to attend Cardiac Rehabilitation for the exercise;Stress management education         Initial Review   Source of Stress Concerns Chronic Illness          Psychosocial Discharge (Final Psychosocial Re-Evaluation):     Psychosocial Re-Evaluation - 10/08/16 1242      Psychosocial Re-Evaluation   Current issues with Current Stress Concerns   Interventions Encouraged to attend Cardiac Rehabilitation for the exercise;Stress management education     Initial Review   Source of Stress Concerns Chronic Illness      Vocational Rehabilitation: Provide vocational rehab assistance to qualifying candidates.   Vocational Rehab Evaluation & Intervention:     Vocational Rehab - 09/17/16 1559      Initial Vocational Rehab Evaluation &  Intervention   Assessment shows need for Vocational Rehabilitation No  Pt is retired from [employer]      Education: Education Goals: Education classes will be provided on a weekly basis, covering required topics. Participant will state understanding/return demonstration of topics presented.  Learning Barriers/Preferences:     Learning Barriers/Preferences - 09/17/16 1359      Learning Barriers/Preferences   Learning Barriers Sight      Education Topics: Count Your Pulse:  -Group instruction provided by verbal instruction, demonstration, patient participation and written materials to support subject.  Instructors address importance of being able to find your pulse and how to count your pulse when at home without a heart  monitor.  Patients get hands on experience counting their pulse with staff help and individually.   Heart Attack, Angina, and Risk Factor Modification:  -Group instruction provided by verbal instruction, video, and written materials to support subject.  Instructors address signs and symptoms of angina and heart attacks.    Also discuss risk factors for heart disease and how to make changes to improve heart health risk factors.   Functional Fitness:  -Group instruction provided by verbal instruction, demonstration, patient participation, and written materials to support subject.  Instructors address safety measures for doing things around the house.  Discuss how to get up and down off the floor, how to pick things up properly, how to safely get out of a chair without assistance, and balance training.   Meditation and Mindfulness:  -Group instruction provided by verbal instruction, patient participation, and written materials to support subject.  Instructor addresses importance of mindfulness and meditation practice to help reduce stress and improve awareness.  Instructor also leads participants through a meditation exercise.    Stretching for Flexibility and Mobility:  -Group instruction provided by verbal instruction, patient participation, and written materials to support subject.  Instructors lead participants through series of stretches that are designed to increase flexibility thus improving mobility.  These stretches are additional exercise for major muscle groups that are typically performed during regular warm up and cool down.   Hands Only CPR:  -Group verbal, video, and participation provides a basic overview of AHA guidelines for community CPR. Role-play of emergencies allow participants the opportunity to practice calling for help and chest compression technique with discussion of AED use.   Hypertension: -Group verbal and written instruction that provides a basic overview of  hypertension including the most recent diagnostic guidelines, risk factor reduction with self-care instructions and medication management.    Nutrition I class: Heart Healthy Eating:  -Group instruction provided by PowerPoint slides, verbal discussion, and written materials to support subject matter. The instructor gives an explanation and review of the Therapeutic Lifestyle Changes diet recommendations, which includes a discussion on lipid goals, dietary fat, sodium, fiber, plant stanol/sterol esters, sugar, and the components of a well-balanced, healthy diet.   Nutrition II class: Lifestyle Skills:  -Group instruction provided by PowerPoint slides, verbal discussion, and written materials to support subject matter. The instructor gives an explanation and review of label reading, grocery shopping for heart health, heart healthy recipe modifications, and ways to make healthier choices when eating out.   Diabetes Question & Answer:  -Group instruction provided by PowerPoint slides, verbal discussion, and written materials to support subject matter. The instructor gives an explanation and review of diabetes co-morbidities, pre- and post-prandial blood glucose goals, pre-exercise blood glucose goals, signs, symptoms, and treatment of hypoglycemia and hyperglycemia, and foot care basics.   Diabetes Blitz:  -  Group instruction provided by PowerPoint slides, verbal discussion, and written materials to support subject matter. The instructor gives an explanation and review of the physiology behind type 1 and type 2 diabetes, diabetes medications and rational behind using different medications, pre- and post-prandial blood glucose recommendations and Hemoglobin A1c goals, diabetes diet, and exercise including blood glucose guidelines for exercising safely.    Portion Distortion:  -Group instruction provided by PowerPoint slides, verbal discussion, written materials, and food models to support subject  matter. The instructor gives an explanation of serving size versus portion size, changes in portions sizes over the last 20 years, and what consists of a serving from each food group.   Stress Management:  -Group instruction provided by verbal instruction, video, and written materials to support subject matter.  Instructors review role of stress in heart disease and how to cope with stress positively.     Exercising on Your Own:  -Group instruction provided by verbal instruction, power point, and written materials to support subject.  Instructors discuss benefits of exercise, components of exercise, frequency and intensity of exercise, and end points for exercise.  Also discuss use of nitroglycerin and activating EMS.  Review options of places to exercise outside of rehab.  Review guidelines for sex with heart disease.   Cardiac Drugs I:  -Group instruction provided by verbal instruction and written materials to support subject.  Instructor reviews cardiac drug classes: antiplatelets, anticoagulants, beta blockers, and statins.  Instructor discusses reasons, side effects, and lifestyle considerations for each drug class.   Cardiac Drugs II:  -Group instruction provided by verbal instruction and written materials to support subject.  Instructor reviews cardiac drug classes: angiotensin converting enzyme inhibitors (ACE-I), angiotensin II receptor blockers (ARBs), nitrates, and calcium channel blockers.  Instructor discusses reasons, side effects, and lifestyle considerations for each drug class.   Anatomy and Physiology of the Circulatory System:  Group verbal and written instruction and models provide basic cardiac anatomy and physiology, with the coronary electrical and arterial systems. Review of: AMI, Angina, Valve disease, Heart Failure, Peripheral Artery Disease, Cardiac Arrhythmia, Pacemakers, and the ICD.   CARDIAC REHAB PHASE II EXERCISE from 09/23/2016 in [facility]  CARDIAC REHAB  Date  09/23/16  Educator  RN  Instruction Review Code  2- meets goals/outcomes      Other Education:  -Group or individual verbal, written, or video instructions that support the educational goals of the cardiac rehab program.   Knowledge Questionnaire Score:     Knowledge Questionnaire Score - 09/17/16 1513      Knowledge Questionnaire Score   Pre Score 22/24      Core Components/Risk Factors/Patient Goals at Admission:     Personal Goals and Risk Factors at Admission - 09/17/16 1519      Core Components/Risk Factors/Patient Goals on Admission   Improve shortness of breath with ADL's Yes   Intervention Provide education, individualized exercise plan and daily activity instruction to help decrease symptoms of SOB with activities of daily living.   Expected Outcomes Short Term: Achieves a reduction of symptoms when performing activities of daily living.   Heart Failure Yes   Intervention Provide a combined exercise and nutrition program that is supplemented with education, support and counseling about heart failure. Directed toward relieving symptoms such as shortness of breath, decreased exercise tolerance, and extremity edema.   Expected Outcomes Improve functional capacity of life;Short term: Attendance in program 2-3 days a week with increased exercise capacity. Reported lower sodium intake. Reported  increased fruit and vegetable intake. Reports medication compliance.;Short term: Daily weights obtained and reported for increase. Utilizing diuretic protocols set by physician.;Long term: Adoption of self-care skills and reduction of barriers for early signs and symptoms recognition and intervention leading to self-care maintenance.   Hypertension Yes   Intervention Provide education on lifestyle modifcations including regular physical activity/exercise, weight management, moderate sodium restriction and increased consumption of fresh fruit, vegetables, and low fat  dairy, alcohol moderation, and smoking cessation.;Monitor prescription use compliance.   Expected Outcomes Short Term: Continued assessment and intervention until BP is < 140/30mm HG in hypertensive participants. < 130/28mm HG in hypertensive participants with diabetes, heart failure or chronic kidney disease.;Long Term: Maintenance of blood pressure at goal levels.   Lipids Yes   Intervention Provide education and support for participant on nutrition & aerobic/resistive exercise along with prescribed medications to achieve LDL 70mg , HDL >40mg .   Expected Outcomes Short Term: Participant states understanding of desired cholesterol values and is compliant with medications prescribed. Participant is following exercise prescription and nutrition guidelines.;Long Term: Cholesterol controlled with medications as prescribed, with individualized exercise RX and with personalized nutrition plan. Value goals: LDL < 70mg , HDL > 40 mg.      Core Components/Risk Factors/Patient Goals Review:    Core Components/Risk Factors/Patient Goals at Discharge (Final Review):    ITP Comments:     ITP Comments    Row Name 09/17/16 1354           ITP Comments Medical Director, Dr. Armanda Magic          Comments: Pt is making expected progress toward personal goals after completing 4sessions. Recommend continued exercise and life style modification education including  stress management and relaxation techniques to decrease cardiac risk profile. Mr Weekes has been to the ED twice this week. Cardiac rehab will be on hold until we get clearance on when Mr Nishi can return to exercise from Dr Shirlee Latch.Gladstone Lighter, RN,BSN 10/08/2016 12:46 PM

## 2016-10-08 NOTE — Discharge Instructions (Signed)
We recommend that you take Imdur as prescribed to see if it helps to eliminate your symptoms. Do not take this medication if you plan to take Viagra. Follow up with your primary care doctor as well as your cardiologist. You may return to the emergency department as needed for new or concerning symptoms.

## 2016-10-08 NOTE — Progress Notes (Signed)
Add on device check, pt having problems getting monitor to send. Interrogation only, no episodes. RA and RV lead measurements stable. Optivol  returned to baseline.

## 2016-10-08 NOTE — Progress Notes (Signed)
No ICM remote transmission received for 10/18/2016 but was checked in the office on 10/07/2016 because he was having difficulty sending report from home monitor. Per note device note, fluid levels were at baseline.  Next ICM remote transmission scheduled for 11/09/2016.

## 2016-10-09 ENCOUNTER — Telehealth (HOSPITAL_COMMUNITY): Payer: Self-pay | Admitting: *Deleted

## 2016-10-09 ENCOUNTER — Encounter (HOSPITAL_COMMUNITY)
Admission: RE | Admit: 2016-10-09 | Discharge: 2016-10-09 | Disposition: A | Discharge status: Home / Self Care | Payer: Medicaid Other | Source: Ambulatory Visit

## 2016-10-09 DIAGNOSIS — I5022 Chronic systolic (congestive) heart failure: Secondary | ICD-10-CM

## 2016-10-09 NOTE — Telephone Encounter (Signed)
Patient called and left message on triage line asking for [place] to call him back.  I tried calling him back but had to leave VM.

## 2016-10-12 ENCOUNTER — Encounter (HOSPITAL_COMMUNITY): Payer: Medicaid Other

## 2016-10-12 ENCOUNTER — Other Ambulatory Visit: Payer: Self-pay

## 2016-10-12 ENCOUNTER — Telehealth: Payer: Self-pay | Admitting: Nurse Practitioner

## 2016-10-12 NOTE — Telephone Encounter (Signed)
What is the medication?

## 2016-10-12 NOTE — Telephone Encounter (Signed)
Advised of echo results. Verbalized understanding.

## 2016-10-12 NOTE — Telephone Encounter (Signed)
Follow Up:    Returning your call from today,concerning his results.

## 2016-10-13 ENCOUNTER — Other Ambulatory Visit: Payer: Self-pay | Admitting: Cardiology

## 2016-10-13 NOTE — Telephone Encounter (Signed)
This is a CHF pt 

## 2016-10-14 ENCOUNTER — Encounter (HOSPITAL_COMMUNITY): Payer: Medicaid Other

## 2016-10-14 ENCOUNTER — Ambulatory Visit (HOSPITAL_COMMUNITY)
Admission: RE | Admit: 2016-10-14 | Discharge: 2016-10-14 | Disposition: A | Discharge status: Home / Self Care | Payer: Medicaid Other | Source: Ambulatory Visit | Attending: Cardiology | Admitting: Cardiology

## 2016-10-14 ENCOUNTER — Encounter (HOSPITAL_COMMUNITY): Payer: Self-pay | Admitting: Cardiology

## 2016-10-14 VITALS — BP 120/69 | HR 86 | Wt 224.0 lb

## 2016-10-14 DIAGNOSIS — Z7982 Long term (current) use of aspirin: Secondary | ICD-10-CM | POA: Diagnosis not present

## 2016-10-14 DIAGNOSIS — I712 Thoracic aortic aneurysm, without rupture: Secondary | ICD-10-CM | POA: Insufficient documentation

## 2016-10-14 DIAGNOSIS — Z7902 Long term (current) use of antithrombotics/antiplatelets: Secondary | ICD-10-CM | POA: Diagnosis not present

## 2016-10-14 DIAGNOSIS — Z8673 Personal history of transient ischemic attack (TIA), and cerebral infarction without residual deficits: Secondary | ICD-10-CM | POA: Insufficient documentation

## 2016-10-14 DIAGNOSIS — F4310 Post-traumatic stress disorder, unspecified: Secondary | ICD-10-CM | POA: Diagnosis not present

## 2016-10-14 DIAGNOSIS — Z87891 Personal history of nicotine dependence: Secondary | ICD-10-CM | POA: Insufficient documentation

## 2016-10-14 DIAGNOSIS — I2510 Atherosclerotic heart disease of native coronary artery without angina pectoris: Secondary | ICD-10-CM | POA: Insufficient documentation

## 2016-10-14 DIAGNOSIS — I472 Ventricular tachycardia, unspecified: Secondary | ICD-10-CM

## 2016-10-14 DIAGNOSIS — I5022 Chronic systolic (congestive) heart failure: Secondary | ICD-10-CM | POA: Diagnosis present

## 2016-10-14 DIAGNOSIS — I340 Nonrheumatic mitral (valve) insufficiency: Secondary | ICD-10-CM | POA: Insufficient documentation

## 2016-10-14 DIAGNOSIS — R001 Bradycardia, unspecified: Secondary | ICD-10-CM | POA: Insufficient documentation

## 2016-10-14 DIAGNOSIS — I441 Atrioventricular block, second degree: Secondary | ICD-10-CM | POA: Diagnosis not present

## 2016-10-14 DIAGNOSIS — Z9111 Patient's noncompliance with dietary regimen: Secondary | ICD-10-CM | POA: Insufficient documentation

## 2016-10-14 DIAGNOSIS — I255 Ischemic cardiomyopathy: Secondary | ICD-10-CM | POA: Insufficient documentation

## 2016-10-14 LAB — DIGOXIN LEVEL: Digoxin Level: <0.2 ng/mL — ABNORMAL LOW (ref 0.8–2.0)

## 2016-10-14 LAB — COMPREHENSIVE METABOLIC PANEL
ALBUMIN: 4 g/dL (ref 3.5–5.0)
ALT: 26 U/L (ref 17–63)
AST: 37 U/L (ref 15–41)
Alkaline Phosphatase: 50 U/L (ref 38–126)
Anion gap: 8 (ref 5–15)
BUN: 20 mg/dL (ref 6–20)
CHLORIDE: 105 mmol/L (ref 101–111)
CO2: 26 mmol/L (ref 22–32)
Calcium: 9.2 mg/dL (ref 8.9–10.3)
Creatinine, Ser: 1.47 mg/dL — ABNORMAL HIGH (ref 0.61–1.24)
GFR calc Af Amer: 57 mL/min — ABNORMAL LOW (ref >60)
GFR calc non Af Amer: 49 mL/min — ABNORMAL LOW (ref >60)
GLUCOSE: 90 mg/dL (ref 65–99)
POTASSIUM: 4.1 mmol/L (ref 3.5–5.1)
Sodium: 139 mmol/L (ref 135–145)
Total Bilirubin: 0.6 mg/dL (ref 0.3–1.2)
Total Protein: 7.1 g/dL (ref 6.5–8.1)

## 2016-10-14 MED ORDER — FUROSEMIDE 40 MG PO TABS: ORAL_TABLET | ORAL | 6 refills | Status: DC

## 2016-10-14 MED ORDER — CLOPIDOGREL BISULFATE 75 MG PO TABS: 75.0000 mg | ORAL_TABLET | Freq: Every day | ORAL | 5 refills | Status: AC

## 2016-10-14 MED ORDER — SPIRONOLACTONE 25 MG PO TABS: 25.0000 mg | ORAL_TABLET | Freq: Every day | ORAL | 3 refills | Status: DC

## 2016-10-14 NOTE — Progress Notes (Signed)
Cardiology: Dr. Jens Som HF Cardiology: Dr. Shirlee Latch    Advanced Heart Failure Clinic Note   61 yo with history of smoking, CAD s/p multiple PCIs, chronic systolic CHF/ischemic cardiomyopathy presents for HF clinic followup.  Cardiac cath in 9/17 showed stable anatomy, no intervention.  EF has been markedly reduced for several years now.  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 PICC was placed and low cardiac output confirmed.  He was started on milrinone and diuresed.  He had CRT upgrade of his Medtronic device.    He was admitted again in 12/17 with multiple ICD discharges in the setting of VT storm.  He was started on amiodarone and mexiletine.  He has had PTSD symptoms since that time. He has had no further VT.  In the hospital, LV lead was turned off (?pro-arrhythmic).  Coronary angiography was done, showing no change compared to 9/17.    Admitted 1/23 - 03/31/16 with acute on chronic CHF.  BCx drawn with worsening back pain and PICC line in place. Found to have Coag negative staph 2/2 BCx. Started on IV antibiotics. Found to have ICD infection with ulceration and drainage from site.  ICD removed 03/23/16 and replaced 03/27/16 with dual chamber ICD with His bundle pacing. IV ABX continued until 04/06/16.   [name] returns today for HF follow up. Weights at home increasing, 220-224 pounds. Last clinic weight was 214 pounds. He has been eating "not good", has eaten smoke sausage dogs recently. Ate 2 yesterday. Feels like he is gaining fluid. He has not taken any extra Lasix. Drinking less than 2L a day, drinking ETOH on the weekends. His wife left him 2 years ago, and he expresses sadness about this. Drinks about 6-7 liquor drinks when he drinks on the weekends. Does not smoke cigarettes. Endorses orthopnea, no PND. Denies chest pain, dizziness, lightheadedness, palpitations. Taking all medications.   He  has had 3 recent ED visits with chest pain and pressure. His usual anginal equivalent is central chest pain that feels sharp, stabbing associated with SOB and left arm numbness. The chest pain he has felt over the past month is more like pressure.   Optivol: Thoracic impedance falling,  His bundle pacing > 99%, activity 2 hrs/day.    ECG (personally reviewed): NSR, His bundle pacing  Labs (10/17): hgb 15.8, K 4.3, creatinine 1.11 => 0.98, AST 78 => 43, ALT 105 => 61, digoxin 0.4 Labs (12/17): digoxin < 0.5, mg 1.8, K 4.4, creatinine 1.23 Labs (3/18): K 4.4, creatinine 1.28 Labs (4/18): BNP 510 Labs (5/18): K 4.2, creatinine 1.36 Labs (8/18): K 4.1, creatinine 1.57, hgb 11.4, BNP 810  PMH: 1. H/o smoking: PFTs (6/17) with minimal obstruction on PFTs. Quit 2/18.  2. Coronary artery disease: Multiple PCIs in the past.  - LHC/RHC (9/17): 85% ostial D2, 99% ostial OM1 (no change from prior) with patent LAD, LCX, OM2, and RCA stents; mean RA 8, PA 58/27 mean 39, mean PCWP 29, CI 2.25, PVR 2.1.  - LHC (12/17): No change compared to 9/17.  3. Chronic systolic CHF: Ischemic cardiomyopathy.   - Echo (10/17): EF 15%, moderate [name], mild to moderate AI, mildly decreased RV systolic function.  - Medtronic CRT-D device => LV lead turned off due to concern for possible pro-arrhythmia.  - CPX (11/17): peak VO2 20.6, VE/VCO2 slope 38, RER 0.97.  Submaximal, probably mild to  moderate HF limitation.  - TEE (03/20/16) with LVEF 15% with mod/severe [name].  - RHC (1/18): mean RA 1, PA 36/11, mean PCWP 7, CI 3.06 Fick/3.08 thermo - 1/18 device infection, CRT-D system removed and replaced with a Medtronic ICD with His bundle pacing.  - CPX (5/18): Peak VO2 16.3 (62% predicted), VE/VCO2 slope 44, RER 1.08 => moderate HF limitation.  - Echo (8/18): EF 20-25%, severe LV dilation, moderate diastolic dysfunction, moderate AI, moderate [name], severe LAE, PASP 49 mmHg.  4. 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  5.  H/o TIA 6. Ascending aortic aneurysm: 4.1 cm on CTA chest in 8/17.  7. Atrial tachycardia: Noted on device interrogation.  8. 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  LV lead turned off due to concern for pro-arrhythmia.  9. Mitral regurgitation: Moderate to severe on 1/18 TEE, likely functional.   ROS: All systems reviewed and negative except as per HPI.   Social History   Social History  . Marital status: Legally Separated    Spouse name: N/A  . Number of children: N/A  . Years of education: N/A   Occupational History  . Not on file.   Social History Main Topics  . Smoking status: Former Smoker    Packs/day: 1.00    Years: 47.00    Types: Cigarettes    Quit date: 11/20/2015  . Smokeless tobacco: Never Used  . Alcohol use 3.6 oz/week    6 Cans of beer per week  . Drug use: No  . Sexual activity: Not Currently   Other Topics Concern  . Not on file   Social History Narrative   Married; full time.    Family History  Problem Relation Age of Onset  . Cancer Father   . Cancer Sister        twin sister and only one has cancer, unknown what kind  . Hypertension Brother   . Cancer Unknown        family hx  . Heart attack Neg Hx   . Stroke Neg Hx    Current Outpatient Prescriptions  Medication Sig Dispense Refill  . ALPRAZolam (XANAX) 0.25 MG tablet Take 1 tablet (0.25 mg total) by mouth at bedtime as needed for anxiety. F/u with PCP for refills. 30 tablet 0  . amiodarone (PACERONE) 200 MG tablet TAKE 1 TABLET BY MOUTH EVERY DAY 90 tablet 3  . aspirin EC 81 MG tablet Take 81 mg by mouth daily.    [name] atorvastatin (LIPITOR) 40 MG tablet Take 1 tablet (40 mg total) by mouth daily. 30 tablet 5  . bisoprolol (ZEBETA) 5 MG tablet Take 0.5 tablets (2.5 mg total) by mouth at bedtime. 15 tablet 11  . clopidogrel (PLAVIX) 75 MG tablet TAKE 1 TABLET BY MOUTH EVERY DAY 30 tablet 5  . digoxin (LANOXIN) 0.125 MG tablet Take one tablet (0.125 mg) by  mouth once every other day 30 tablet 3  . famotidine (PEPCID) 20 MG tablet Take 1 tablet (20 mg total) by mouth 2 (two) times daily. 60 tablet 6  . furosemide (LASIX) 40 MG tablet Take 2 tablets (80 mg total) by mouth daily. May take extra 40 mg (1 tab) once daily for weight gain 3 lbs or more overnight. 90 tablet 6  . hydrOXYzine (VISTARIL) 25 MG capsule Take 25 mg by mouth at bedtime as needed (sleep).    . isosorbide mononitrate (IMDUR) 30 MG 24 hr tablet Take 1 tablet (  30 mg total) by mouth daily. 15 tablet 0  . losartan (COZAAR) 25 MG tablet Take 1 tablet (25 mg total) by mouth daily. 30 tablet 3  . magnesium oxide (MAG-OX) 400 (241.3 Mg) MG tablet Take 1 tablet (400 mg total) by mouth daily. 90 tablet 2  . mexiletine (MEXITIL) 200 MG capsule TAKE ONE CAPSULE BY MOUTH EVERY 12 HOURS 60 capsule 3  . NITROSTAT 0.4 MG SL tablet PLACE 1 TABLET (0.4 MG TOTAL) UNDER THE TONGUE EVERY 5 (FIVE) MINUTES AS NEEDED FOR CHEST PAIN 25 tablet 6  . PARoxetine (PAXIL) 20 MG tablet Take 1 tablet (20 mg total) by mouth daily. 30 tablet 3  . sildenafil (REVATIO) 20 MG tablet Take 1 tablet (20 mg total) by mouth as needed. 10 tablet 0  . spironolactone (ALDACTONE) 25 MG tablet Take 12.5 mg by mouth daily.    [name] triamcinolone cream (KENALOG) 0.1 % USE 1 APPLICATION TOPICALLY TO THE AFFECTED AREA TWICE A DAY AS NEEDED FOR ECZEMA  5   No current facility-administered medications for this encounter.    BP 120/69   Pulse 86   Wt 224 lb (101.6 kg)   SpO2 99%   BMI 30.38 kg/m    Wt Readings from Last 3 Encounters:  10/14/16 224 lb (101.6 kg)  10/07/16 219 lb 12.8 oz (99.7 kg)  09/17/16 219 lb 12.8 oz (99.7 kg)    General: Well appearing male, NAD.  Neck: Supple, JVP 9 cm, +HJR. No thyromegaly or thyroid nodule.  Lungs: Clear bilaterally. Normal effort.  CV: Nondisplaced PMI.  Heart regular rate and rhythm. 2/6 HSM at apex.  No peripheral edema.  No carotid bruit.  Normal pedal pulses.  Abdomen: Soft, non  tender, non distended.  no HSM. No bruits or masses. +BS .  Skin: Intact without lesions or rashes.  Neurologic: Alert and oriented x 3.  Psych: Normal affect.  Extremities: No clubbing or cyanosis.  HEENT: Normal.   Assessment/Plan: 1. Chronic systolic CHF: With low cardiac output noted on during admission in 10/17.  Ischemic cardiomyopathy, had upgrade to Medtronic BiV ICD in 10/17.  However, with VT storm in 12/17, the LV lead was turned off due to concerns for pro-arrhythmia.  In 1/18, he developed a device infection and the BiV-ICD was removed and later replaced with a Medtronic ICD with His bundle pacing.  RHC (1/18) with preserved cardiac output.  TEE (1/18) with EF 15%.  CPX in 5/18 with moderate HF limitation.  Most recent echo in 8/18 with EF 20-25%. NYHA III symptoms with volume overload on exam.  Weight up 10 lbs, due in part to dietary noncompliance.  - I advised him to reduce his salt intake, especially sausage dogs. He understands. Also advised him to reduce his ETOH intake.  - Increase Lasix to 80 mg BID for 3 days, then increase Lasix to 80 mg in the am/40 mg in the pm as maintenance dose.  BMET today and again in 10 days.  - Did not tolerate Entresto due to orthostasis.  - Continue losartan 25 mg daily.  - Increase Spiro to 25 mg daily.  - Continue digoxin 0.125 mg every other day. Check level today.  - Continue bisoprolol 2.5 qhs.  2.  CAD: No chest pain.  LHC in 12/17 with stable coronary disease.  - Continue ASA, Plavix, statin.   3. Smoking: has stopped smoking since 2/18.  4. Rhythm: Per Dr. 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  Suspect this contributed to HF with loss of AV synchrony (and also was bradycardic).  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  He was BiV paced after 10/17 admission, but LV lead was turned off due to concern for pro-arrhythmia.  He now has a Medtronic ICD with His bundle pacing.  5. Ascending aortic aneurysm: 4.1 cm in  8/17. Will arrange for MRA chest next visit.  6. Ventricular tachycardia: VT storm in 12/17.   - Continue amiodarone and mexiletine. No VT/VF by device interrogation.  - With amiodarone use, check LFTs and TSH today.  He will need regular eye exam.  - Check TSH today.  7. PTSD: Patient is on paroxetine due to significant anxiety around shocks.   - Followed by PCP.   Follow up in 3 weeks.   Little Ishikawa, NP  10/14/2016   Patient seen with NP, agree with the above note.  I independently examined the patient and edited the above note.   On exam today, [name] is volume overloaded with elevated JVP, HJR.  Optivol reviewed, fluid index trending up and impedance down.   - Increase Lasix to 80 mg bid x 3 days then down to 80 qam/40 qpm.  - Increase spironolactone to 25 mg daily.  - BMET today and in 10 days.  No further VT on amiodarone.  Check CMET and TSH given amiodarone use. He will need regular eye exam.   Close followup in 3 wks to check on him.   Marca Ancona 10/16/2016

## 2016-10-14 NOTE — Patient Instructions (Addendum)
Labs today (will call for abnormal results, otherwise no news is good news)  Take Lasix 80 mg (2 Tabs) Twice Daily for 3 days only.  Then take 80 mg (2 Tabs) in the AM and 40 mg (1 Tab) in the PM.   INCREASE Spironolactone to 25 mg (1 Tablet) Once Daily  CT Angio has been ordered for you, we will schedule at checkout.   Labs in 7-10 Days (BMET)  Follow up in 3 weeks.

## 2016-10-14 NOTE — Telephone Encounter (Signed)
Yes I will send it now. Thanks

## 2016-10-15 NOTE — Addendum Note (Signed)
Addendum  created 10/15/16 1024 by Adara Kittle, MD   Sign clinical note    

## 2016-10-16 ENCOUNTER — Encounter (HOSPITAL_COMMUNITY): Payer: Medicaid Other

## 2016-10-19 ENCOUNTER — Encounter (HOSPITAL_COMMUNITY): Payer: Medicaid Other

## 2016-10-21 ENCOUNTER — Other Ambulatory Visit: Payer: Self-pay | Admitting: Cardiology

## 2016-10-21 ENCOUNTER — Encounter (HOSPITAL_COMMUNITY): Payer: Medicaid Other

## 2016-10-22 ENCOUNTER — Ambulatory Visit (HOSPITAL_COMMUNITY)
Admission: RE | Admit: 2016-10-22 | Discharge: 2016-10-22 | Disposition: A | Discharge status: Home / Self Care | Payer: Medicaid Other | Source: Ambulatory Visit | Attending: Cardiology | Admitting: Cardiology

## 2016-10-22 ENCOUNTER — Other Ambulatory Visit: Payer: Self-pay | Admitting: Internal Medicine

## 2016-10-22 DIAGNOSIS — K7689 Other specified diseases of liver: Secondary | ICD-10-CM | POA: Insufficient documentation

## 2016-10-22 DIAGNOSIS — K5730 Diverticulosis of large intestine without perforation or abscess without bleeding: Secondary | ICD-10-CM | POA: Diagnosis not present

## 2016-10-22 DIAGNOSIS — Z9049 Acquired absence of other specified parts of digestive tract: Secondary | ICD-10-CM | POA: Insufficient documentation

## 2016-10-22 DIAGNOSIS — I2510 Atherosclerotic heart disease of native coronary artery without angina pectoris: Secondary | ICD-10-CM | POA: Diagnosis not present

## 2016-10-22 DIAGNOSIS — I700 Atherosclerosis of aorta: Secondary | ICD-10-CM | POA: Diagnosis not present

## 2016-10-22 DIAGNOSIS — I517 Cardiomegaly: Secondary | ICD-10-CM | POA: Insufficient documentation

## 2016-10-22 DIAGNOSIS — I5022 Chronic systolic (congestive) heart failure: Secondary | ICD-10-CM | POA: Insufficient documentation

## 2016-10-22 LAB — BASIC METABOLIC PANEL
ANION GAP: 9 (ref 5–15)
BUN: 20 mg/dL (ref 6–20)
CHLORIDE: 103 mmol/L (ref 101–111)
CO2: 27 mmol/L (ref 22–32)
Calcium: 9.1 mg/dL (ref 8.9–10.3)
Creatinine, Ser: 1.76 mg/dL — ABNORMAL HIGH (ref 0.61–1.24)
GFR calc non Af Amer: 39 mL/min — ABNORMAL LOW (ref >60)
GFR, EST AFRICAN AMERICAN: 46 mL/min — AB (ref >60)
Glucose, Bld: 91 mg/dL (ref 65–99)
POTASSIUM: 4 mmol/L (ref 3.5–5.1)
SODIUM: 139 mmol/L (ref 135–145)

## 2016-10-22 MED ORDER — IOPAMIDOL (ISOVUE-370) INJECTION 76%
INTRAVENOUS | Status: AC
  Administered 2016-10-22: 80 mL
  Filled 2016-10-22: qty 100

## 2016-10-23 ENCOUNTER — Inpatient Hospital Stay (HOSPITAL_COMMUNITY): Admission: RE | Admit: 2016-10-23 | Payer: Medicaid Other | Source: Ambulatory Visit

## 2016-10-23 ENCOUNTER — Other Ambulatory Visit (HOSPITAL_COMMUNITY): Payer: Medicaid Other

## 2016-10-23 ENCOUNTER — Other Ambulatory Visit: Payer: Self-pay | Admitting: Physician Assistant

## 2016-10-23 ENCOUNTER — Encounter (HOSPITAL_COMMUNITY): Admission: RE | Admit: 2016-10-23 | Payer: Medicaid Other | Source: Ambulatory Visit

## 2016-10-27 ENCOUNTER — Telehealth (HOSPITAL_COMMUNITY): Payer: Self-pay | Admitting: *Deleted

## 2016-10-27 NOTE — Telephone Encounter (Signed)
CT Angio Chest/Abd/Pel for Dissection W and/or W/WO  Order: [id number]  Status:  Final result Visible to patient:  Yes (MyChart) Dx:  Chronic systolic CHF (congestive hear...  Notes recorded by Georgina PeerFarver, Joab Carden S, RN on 10/27/2016 at 3:23 PM EDT Called and spoke with patient, he is aware and no further questions. ------  Notes recorded by Laurey MoraleMcLean, Dalton S, MD on 10/23/2016 at 9:01 AM EDT No acute findings

## 2016-10-28 ENCOUNTER — Encounter (HOSPITAL_COMMUNITY): Payer: Medicaid Other

## 2016-10-30 ENCOUNTER — Encounter (HOSPITAL_COMMUNITY): Payer: Medicaid Other

## 2016-10-30 ENCOUNTER — Other Ambulatory Visit: Payer: Self-pay

## 2016-11-02 ENCOUNTER — Other Ambulatory Visit: Payer: Self-pay

## 2016-11-02 ENCOUNTER — Encounter (HOSPITAL_COMMUNITY): Payer: Medicaid Other

## 2016-11-03 ENCOUNTER — Other Ambulatory Visit: Payer: Self-pay | Admitting: Internal Medicine

## 2016-11-03 ENCOUNTER — Telehealth (HOSPITAL_COMMUNITY): Payer: Self-pay | Admitting: *Deleted

## 2016-11-03 NOTE — Addendum Note (Signed)
Encounter addended by: Artist Paisichards, Foch Rosenwald M on: 11/03/2016  8:04 AM<BR>    Actions taken: Visit Navigator Flowsheet section accepted, Flowsheet data copied forward, Flowsheet accepted

## 2016-11-04 ENCOUNTER — Ambulatory Visit (HOSPITAL_COMMUNITY)
Admission: RE | Admit: 2016-11-04 | Discharge: 2016-11-04 | Disposition: A | Discharge status: Home / Self Care | Payer: Medicaid Other | Source: Ambulatory Visit | Attending: Cardiology | Admitting: Cardiology

## 2016-11-04 ENCOUNTER — Telehealth (HOSPITAL_COMMUNITY): Payer: Self-pay | Admitting: *Deleted

## 2016-11-04 ENCOUNTER — Encounter (HOSPITAL_COMMUNITY)
Admission: RE | Admit: 2016-11-04 | Discharge: 2016-11-04 | Disposition: A | Discharge status: Home / Self Care | Payer: Medicaid Other | Source: Ambulatory Visit | Attending: Internal Medicine | Admitting: Internal Medicine

## 2016-11-04 ENCOUNTER — Encounter (HOSPITAL_COMMUNITY): Payer: Self-pay | Admitting: Cardiology

## 2016-11-04 VITALS — BP 114/69 | HR 89 | Wt 219.0 lb

## 2016-11-04 DIAGNOSIS — I255 Ischemic cardiomyopathy: Secondary | ICD-10-CM | POA: Insufficient documentation

## 2016-11-04 DIAGNOSIS — I441 Atrioventricular block, second degree: Secondary | ICD-10-CM | POA: Insufficient documentation

## 2016-11-04 DIAGNOSIS — I340 Nonrheumatic mitral (valve) insufficiency: Secondary | ICD-10-CM | POA: Diagnosis not present

## 2016-11-04 DIAGNOSIS — Z7902 Long term (current) use of antithrombotics/antiplatelets: Secondary | ICD-10-CM | POA: Diagnosis not present

## 2016-11-04 DIAGNOSIS — Z7982 Long term (current) use of aspirin: Secondary | ICD-10-CM | POA: Diagnosis not present

## 2016-11-04 DIAGNOSIS — I472 Ventricular tachycardia, unspecified: Secondary | ICD-10-CM

## 2016-11-04 DIAGNOSIS — Z87891 Personal history of nicotine dependence: Secondary | ICD-10-CM | POA: Diagnosis not present

## 2016-11-04 DIAGNOSIS — I4510 Unspecified right bundle-branch block: Secondary | ICD-10-CM | POA: Insufficient documentation

## 2016-11-04 DIAGNOSIS — I5022 Chronic systolic (congestive) heart failure: Secondary | ICD-10-CM | POA: Insufficient documentation

## 2016-11-04 DIAGNOSIS — F4310 Post-traumatic stress disorder, unspecified: Secondary | ICD-10-CM | POA: Insufficient documentation

## 2016-11-04 DIAGNOSIS — Z95810 Presence of automatic (implantable) cardiac defibrillator: Secondary | ICD-10-CM | POA: Insufficient documentation

## 2016-11-04 DIAGNOSIS — R001 Bradycardia, unspecified: Secondary | ICD-10-CM | POA: Diagnosis not present

## 2016-11-04 DIAGNOSIS — I712 Thoracic aortic aneurysm, without rupture: Secondary | ICD-10-CM | POA: Diagnosis not present

## 2016-11-04 DIAGNOSIS — I2510 Atherosclerotic heart disease of native coronary artery without angina pectoris: Secondary | ICD-10-CM | POA: Diagnosis present

## 2016-11-04 DIAGNOSIS — Z79899 Other long term (current) drug therapy: Secondary | ICD-10-CM | POA: Insufficient documentation

## 2016-11-04 DIAGNOSIS — Z8673 Personal history of transient ischemic attack (TIA), and cerebral infarction without residual deficits: Secondary | ICD-10-CM | POA: Diagnosis not present

## 2016-11-04 LAB — BASIC METABOLIC PANEL
ANION GAP: 7 (ref 5–15)
BUN: 22 mg/dL — ABNORMAL HIGH (ref 6–20)
CHLORIDE: 104 mmol/L (ref 101–111)
CO2: 26 mmol/L (ref 22–32)
Calcium: 9.4 mg/dL (ref 8.9–10.3)
Creatinine, Ser: 1.59 mg/dL — ABNORMAL HIGH (ref 0.61–1.24)
GFR calc Af Amer: 52 mL/min — ABNORMAL LOW (ref >60)
GFR, EST NON AFRICAN AMERICAN: 45 mL/min — AB (ref >60)
Glucose, Bld: 71 mg/dL (ref 65–99)
POTASSIUM: 3.8 mmol/L (ref 3.5–5.1)
SODIUM: 137 mmol/L (ref 135–145)

## 2016-11-04 LAB — DIGOXIN LEVEL: Digoxin Level: 0.2 ng/mL — ABNORMAL LOW (ref 0.8–2.0)

## 2016-11-04 LAB — TSH: TSH: 3.414 u[IU]/mL (ref 0.350–4.500)

## 2016-11-04 MED ORDER — PAROXETINE HCL 20 MG PO TABS: 20.0000 mg | ORAL_TABLET | Freq: Every day | ORAL | 3 refills | Status: AC

## 2016-11-04 MED ORDER — LOSARTAN POTASSIUM 25 MG PO TABS: 25.0000 mg | ORAL_TABLET | Freq: Every day | ORAL | 3 refills | Status: DC

## 2016-11-04 MED ORDER — BISOPROLOL FUMARATE 5 MG PO TABS: 5.0000 mg | ORAL_TABLET | Freq: Every day | ORAL | 11 refills | Status: DC

## 2016-11-04 NOTE — Telephone Encounter (Signed)
Can return to rehab now.

## 2016-11-04 NOTE — Telephone Encounter (Signed)
[name] HesselbachMaria called asking if and when patient could return return to cardiac rehab.  Message routed to Dr.McLean for advice.

## 2016-11-04 NOTE — Progress Notes (Signed)
Cardiac Individual Treatment Plan  Patient Details  Name: [name] MRN: [id number] Date of Birth: 1955/06/13 Referring Provider:     CARDIAC REHAB PHASE II ORIENTATION from 09/17/2016 in [facility] CARDIAC [facility]  Referring Provider  Marca Ancona MD      Initial Encounter Date:    CARDIAC REHAB PHASE II ORIENTATION from 09/17/2016 in [facility] CARDIAC REHAB  Date  09/17/16  Referring Provider  Marca Ancona MD      Visit Diagnosis: Heart failure, chronic systolic (HCC)  Patient's Home Medications on Admission:  Current Outpatient Prescriptions:  .  ALPRAZolam (XANAX) 0.25 MG tablet, Take 1 tablet (0.25 mg total) by mouth at bedtime as needed for anxiety. F/u with PCP for refills., Disp: 30 tablet, Rfl: 0 .  amiodarone (PACERONE) 200 MG tablet, TAKE 1 TABLET BY MOUTH EVERY DAY, Disp: 90 tablet, Rfl: 3 .  aspirin EC 81 MG tablet, Take 81 mg by mouth daily., Disp: , Rfl:  .  atorvastatin (LIPITOR) 40 MG tablet, TAKE 1 TABLET BY MOUTH EVERY DAY, Disp: 30 tablet, Rfl: 5 .  bisoprolol (ZEBETA) 5 MG tablet, Take 1 tablet (5 mg total) by mouth at bedtime., Disp: 30 tablet, Rfl: 11 .  clopidogrel (PLAVIX) 75 MG tablet, Take 1 tablet (75 mg total) by mouth daily., Disp: 30 tablet, Rfl: 5 .  digoxin (LANOXIN) 0.125 MG tablet, Take one tablet (0.125 mg) by mouth once every other day, Disp: 30 tablet, Rfl: 3 .  famotidine (PEPCID) 20 MG tablet, Take 1 tablet (20 mg total) by mouth 2 (two) times daily., Disp: 60 tablet, Rfl: 6 .  furosemide (LASIX) 40 MG tablet, Take 80 mg in the AM and 40 mg in the PM, Disp: 90 tablet, Rfl: 6 .  hydrOXYzine (VISTARIL) 25 MG capsule, Take 25 mg by mouth at bedtime as needed (sleep)., Disp: , Rfl:  .  losartan (COZAAR) 25 MG tablet, Take 1 tablet (25 mg total) by mouth daily., Disp: 30 tablet, Rfl: 3 .  magnesium oxide (MAG-OX) 400 (241.3 Mg) MG tablet, Take 1 tablet (400 mg total) by mouth daily., Disp: 90 tablet, Rfl: 2 .   mexiletine (MEXITIL) 200 MG capsule, TAKE ONE CAPSULE BY MOUTH EVERY 12 HOURS, Disp: 60 capsule, Rfl: 3 .  NITROSTAT 0.4 MG SL tablet, PLACE 1 TABLET (0.4 MG TOTAL) UNDER THE TONGUE EVERY 5 (FIVE) MINUTES AS NEEDED FOR CHEST PAIN, Disp: 25 tablet, Rfl: 6 .  PARoxetine (PAXIL) 20 MG tablet, Take 1 tablet (20 mg total) by mouth daily., Disp: 30 tablet, Rfl: 3 .  sildenafil (REVATIO) 20 MG tablet, Take 1 tablet (20 mg total) by mouth as needed., Disp: 10 tablet, Rfl: 0 .  spironolactone (ALDACTONE) 25 MG tablet, Take 12.5 mg by mouth daily., Disp: , Rfl:  .  triamcinolone cream (KENALOG) 0.1 %, USE 1 APPLICATION TOPICALLY TO THE AFFECTED AREA TWICE A DAY AS NEEDED FOR ECZEMA, Disp: , Rfl: 5  Past Medical History: Past Medical History:  Diagnosis Date  . Anxiety   . Automatic implantable cardioverter-defibrillator in situ    a. Medtronic ICD.  [name] CAD (coronary artery disease)    a.  Severe LAD stenosis 2/2 acute thrombus - BMS 2009;  b. 06/01/11 Cath - LAD 20 isr, 91m (3.0x16 Veri-flex BMS), OM1 100p, EF 20-25%;  c. 07/2012 Abnl Cardiolite;  d. 08/2012 Cath/PCI: LM nl, LAD patent stents, D2 80-90 (jailed), LCX 90 p/m (4.0x24 Promus Premier DES), OM1 100, OM2 80-90p (3.0x20 Promus  Premier DES), RCA patent mid stent, 40d, EF 25%.  e. 10/29/15 LHC stable disease with patent stents  . Chronic systolic CHF (congestive heart failure), NYHA class 3 (HCC) - low output state 12/12/2014  . Depression   . Dyspnea   . History of rheumatic fever 1962  . HLD (hyperlipidemia) mixed  . HTN (hypertension)   . Ischemic cardiomyopathy    a.  EF 30-35% 2010;  b.  EF 20-25% by LV gram 08/2012. c. EF 15% by echo 11/2014.  [name] Myocardial infarction [facility]) 1998; 2002; ~ 2010  . Paroxysmal ventricular tachycardia (HCC)    a. VFlutter  CL 210 msec  Rx shock 04/2011  . PVC's (premature ventricular contractions)   . RBBB   . TIA (transient ischemic attack)    a. Dx 11/2014, continued on ASA/Plavix.    Tobacco Use: History   Smoking Status  . Former Smoker  . Packs/day: 1.00  . Years: 47.00  . Types: Cigarettes  . Quit date: 11/20/2015  Smokeless Tobacco  . Never Used    Labs: Recent Review Flowsheet Data    Labs for ITP Cardiac and Pulmonary Rehab Latest Ref Rng & Units 03/21/2016 03/22/2016 03/23/2016 03/23/2016 03/26/2016   Cholestrol 0 - 200 mg/dL - - - - -   LDLCALC 0 - 99 mg/dL - - - - -   LDLDIRECT mg/dL - - - - -   HDL >19 mg/dL - - - - -   Trlycerides <150 mg/dL - - - - -   Hemoglobin A1c 4.8 - 5.6 % - - - - -   PHART 7.350 - 7.450 - - - 7.487(H) -   PCO2ART 32.0 - 48.0 mmHg - - - 38.4 -   HCO3 20.0 - 28.0 mmol/L - - - 29.2(H) -   TCO2 0 - 100 mmol/L - - - 30 -   ACIDBASEDEF 0.0 - 2.0 mmol/L - - - - -   O2SAT % 60.3 64.9 59.3 100.0 98.8      Capillary Blood Glucose: Lab Results  Component Value Date   GLUCAP 103 (H) 03/27/2016   GLUCAP 88 03/24/2016   GLUCAP 98 03/21/2016   GLUCAP 97 01/28/2016   GLUCAP 102 (H) 12/09/2015     Exercise Target Goals:    Exercise Program Goal: Individual exercise prescription set with THRR, safety & activity barriers. Participant demonstrates ability to understand and report RPE using BORG scale, to self-measure pulse accurately, and to acknowledge the importance of the exercise prescription.  Exercise Prescription Goal: Starting with aerobic activity 30 plus minutes a day, 3 days per week for initial exercise prescription. Provide home exercise prescription and guidelines that participant acknowledges understanding prior to discharge.  Activity Barriers & Risk Stratification:     Activity Barriers & Cardiac Risk Stratification - 09/17/16 1359      Activity Barriers & Cardiac Risk Stratification   Activity Barriers Joint Problems;Deconditioning;Muscular Weakness;Shortness of Breath;Assistive Device   Cardiac Risk Stratification High      6 Minute Walk:     6 Minute Walk    Row Name 09/17/16 1513         6 Minute Walk   Phase Initial      Distance 1440 feet     Walk Time 6 minutes     # of Rest Breaks 0     MPH 2.73     METS 3.1     RPE 14     Perceived Dyspnea  3  VO2 Peak 10.88     Symptoms Yes (comment)     Comments SOB     Resting HR 72 bpm     Resting BP 98/63     Max Ex. HR 97 bpm     Max Ex. BP 126/70     2 Minute Post BP 99/64        Oxygen Initial Assessment:   Oxygen Re-Evaluation:   Oxygen Discharge (Final Oxygen Re-Evaluation):   Initial Exercise Prescription:     Initial Exercise Prescription - 09/17/16 1500      Date of Initial Exercise RX and Referring Provider   Date 09/17/16   Referring Provider Marca Ancona MD     Treadmill   MPH 2.5   Grade 0   Minutes 10   METs 2.91     Bike   Level 0.7   Minutes 10   METs 2.32     NuStep   Level 3   SPM 80   Minutes 10   METs 2     Prescription Details   Frequency (times per week) 3   Duration Progress to 30 minutes of continuous aerobic without signs/symptoms of physical distress     Intensity   THRR 40-80% of Max Heartrate 63-126   Ratings of Perceived Exertion 11-15   Perceived Dyspnea 0-4     Progression   Progression Continue to progress workloads to maintain intensity without signs/symptoms of physical distress.     Resistance Training   Training Prescription Yes   Weight 3lbs   Reps 10-15      Perform Capillary Blood Glucose checks as needed.  Exercise Prescription Changes:      Exercise Prescription Changes    Row Name 10/05/16 1600 11/03/16 0800           Response to Exercise   Blood Pressure (Admit) 100/54 118/62      Blood Pressure (Exercise) 118/70 118/60      Blood Pressure (Exit) 104/64 122/62      Heart Rate (Admit) 78 bpm 81 bpm      Heart Rate (Exercise) 96 bpm 98 bpm      Heart Rate (Exit) 76 bpm 81 bpm      Rating of Perceived Exertion (Exercise) 14 15      Symptoms  - none      Comments  - Last exercise session on 09/28/16. Medical issues.      Duration Progress to 45 minutes of  aerobic exercise without signs/symptoms of physical distress Progress to 30 minutes of  aerobic without signs/symptoms of physical distress      Intensity THRR unchanged THRR unchanged        Progression   Progression Continue to progress workloads to maintain intensity without signs/symptoms of physical distress. Continue to progress workloads to maintain intensity without signs/symptoms of physical distress.      Average METs  - 2.5        Resistance Training   Training Prescription Yes Yes      Weight 3lbs 3lbs      Reps 10-15 10-15        Interval Training   Interval Training  - No        Treadmill   MPH 2.5 2.5      Grade 0 0      Minutes 10 10      METs 2.91 2.91        Bike   Level 0.7 0.7  Minutes 10 10      METs 2.32 2.32        NuStep   Level 3 3      SPM 80 80      Minutes 10 10      METs 22.1 2.1         Exercise Comments:      Exercise Comments    Row Name 10/05/16 1643 11/03/16 0801         Exercise Comments Pt is off to a great start with exercise! Pt has been out from exercise since 09/28/16 due to ongoing SOB, CP. Multiple ED visits since last time at [facility].         Exercise Goals and Review:      Exercise Goals    Row Name 09/17/16 1402             Exercise Goals   Increase Physical Activity Yes       Intervention Provide advice, education, support and counseling about physical activity/exercise needs.;Develop an individualized exercise prescription for aerobic and resistive training based on initial evaluation findings, risk stratification, comorbidities and participant's personal goals.       Expected Outcomes Achievement of increased cardiorespiratory fitness and enhanced flexibility, muscular endurance and strength shown through measurements of functional capacity and personal statement of participant.       Increase Strength and Stamina Yes  increase strength and heart function to avoid/delay heart transplant or LVAD        Intervention Provide advice, education, support and counseling about physical activity/exercise needs.;Develop an individualized exercise prescription for aerobic and resistive training based on initial evaluation findings, risk stratification, comorbidities and participant's personal goals.       Expected Outcomes Achievement of increased cardiorespiratory fitness and enhanced flexibility, muscular endurance and strength shown through measurements of functional capacity and personal statement of participant.          Exercise Goals Re-Evaluation :     Exercise Goals Re-Evaluation    Row Name 11/03/16 0801             Exercise Goal Re-Evaluation   Comments Unable to reevaluate goals at this time due to non-attendance because ongoing medical issues.           Discharge Exercise Prescription (Final Exercise Prescription Changes):     Exercise Prescription Changes - 11/03/16 0800      Response to Exercise   Blood Pressure (Admit) 118/62   Blood Pressure (Exercise) 118/60   Blood Pressure (Exit) 122/62   Heart Rate (Admit) 81 bpm   Heart Rate (Exercise) 98 bpm   Heart Rate (Exit) 81 bpm   Rating of Perceived Exertion (Exercise) 15   Symptoms none   Comments Last exercise session on 09/28/16. Medical issues.   Duration Progress to 30 minutes of  aerobic without signs/symptoms of physical distress   Intensity THRR unchanged     Progression   Progression Continue to progress workloads to maintain intensity without signs/symptoms of physical distress.   Average METs 2.5     Resistance Training   Training Prescription Yes   Weight 3lbs   Reps 10-15     Interval Training   Interval Training No     Treadmill   MPH 2.5   Grade 0   Minutes 10   METs 2.91     Bike   Level 0.7   Minutes 10   METs 2.32     NuStep   Level 3  SPM 80   Minutes 10   METs 2.1      Nutrition:  Target Goals: Understanding of nutrition guidelines, daily intake of sodium 1500mg ,  cholesterol 200mg , calories 30% from fat and 7% or less from saturated fats, daily to have 5 or more servings of fruits and vegetables.  Biometrics:     Pre Biometrics - 09/17/16 1518      Pre Biometrics   Waist Circumference 41 inches   Hip Circumference 42 inches   Waist to Hip Ratio 0.98 %   Triceps Skinfold 26 mm   % Body Fat 31 %   Grip Strength 45.5 kg   Flexibility --  LBP!   Single Leg Stand 20 seconds       Nutrition Therapy Plan and Nutrition Goals:     Nutrition Therapy & Goals - 09/23/16 1417      Nutrition Therapy   Diet Therapeutic Lifestyle Changes     Personal Nutrition Goals   Nutrition Goal Wt loss of 1-2 lb/week to a wt loss goal of 6-24 lb at graduation from Cardiac Rehab   Personal Goal #2 Pt to identify and limit food sources of saturated fat, trans fat, and sodium     Intervention Plan   Intervention Prescribe, educate and counsel regarding individualized specific dietary modifications aiming towards targeted core components such as weight, hypertension, lipid management, diabetes, heart failure and other comorbidities.   Expected Outcomes Short Term Goal: Understand basic principles of dietary content, such as calories, fat, sodium, cholesterol and nutrients.;Long Term Goal: Adherence to prescribed nutrition plan.      Nutrition Discharge: Nutrition Scores:     Nutrition Assessments - 09/23/16 1417      MEDFICTS Scores   Pre Score 52      Nutrition Goals Re-Evaluation:   Nutrition Goals Re-Evaluation:   Nutrition Goals Discharge (Final Nutrition Goals Re-Evaluation):   Psychosocial: Target Goals: Acknowledge presence or absence of significant depression and/or stress, maximize coping skills, provide positive support system. Participant is able to verbalize types and ability to use techniques and skills needed for reducing stress and depression.  Initial Review & Psychosocial Screening:     Initial Psych Review & Screening -  09/17/16 1603      Initial Review   Current issues with Current Anxiety/Panic     Barriers   Psychosocial barriers to participate in program The patient should benefit from training in stress management and relaxation.      Quality of Life Scores:     Quality of Life - 09/17/16 1521      Quality of Life Scores   Health/Function Pre 17.6 %   Socioeconomic Pre 27 %   Psych/Spiritual Pre 27.43 %   Family Pre 28.5 %   GLOBAL Pre 22.88 %      PHQ-9: Recent Review Flowsheet Data    Depression screen [facility] 2/9 04/23/2016   Decreased Interest 0   Down, Depressed, Hopeless 0   PHQ - 2 Score 0     Interpretation of Total Score  Total Score Depression Severity:  1-4 = Minimal depression, 5-9 = Mild depression, 10-14 = Moderate depression, 15-19 = Moderately severe depression, 20-27 = Severe depression   Psychosocial Evaluation and Intervention:   Psychosocial Re-Evaluation:     Psychosocial Re-Evaluation    Row Name 10/08/16 1242 11/04/16 1432           Psychosocial Re-Evaluation   Current issues with Current Stress Concerns Current Stress Concerns  Comments  - Mr Gwyndolyn SaxonSwift has exercise has been on hold      Interventions Encouraged to attend Cardiac Rehabilitation for the exercise;Stress management education Stress management education;Encouraged to attend Cardiac Rehabilitation for the exercise      Continue Psychosocial Services   - No Follow up required        Initial Review   Source of Stress Concerns Chronic Illness Chronic Illness         Psychosocial Discharge (Final Psychosocial Re-Evaluation):     Psychosocial Re-Evaluation - 11/04/16 1432      Psychosocial Re-Evaluation   Current issues with Current Stress Concerns   Comments Mr Gwyndolyn SaxonSwift has exercise has been on hold   Interventions Stress management education;Encouraged to attend Cardiac Rehabilitation for the exercise   Continue Psychosocial Services  No Follow up required     Initial Review   Source  of Stress Concerns Chronic Illness      Vocational Rehabilitation: Provide vocational rehab assistance to qualifying candidates.   Vocational Rehab Evaluation & Intervention:     Vocational Rehab - 09/17/16 1559      Initial Vocational Rehab Evaluation & Intervention   Assessment shows need for Vocational Rehabilitation No  Pt is retired from [employer]      Education: Education Goals: Education classes will be provided on a weekly basis, covering required topics. Participant will state understanding/return demonstration of topics presented.  Learning Barriers/Preferences:     Learning Barriers/Preferences - 09/17/16 1359      Learning Barriers/Preferences   Learning Barriers Sight      Education Topics: Count Your Pulse:  -Group instruction provided by verbal instruction, demonstration, patient participation and written materials to support subject.  Instructors address importance of being able to find your pulse and how to count your pulse when at home without a heart monitor.  Patients get hands on experience counting their pulse with staff help and individually.   Heart Attack, Angina, and Risk Factor Modification:  -Group instruction provided by verbal instruction, video, and written materials to support subject.  Instructors address signs and symptoms of angina and heart attacks.    Also discuss risk factors for heart disease and how to make changes to improve heart health risk factors.   Functional Fitness:  -Group instruction provided by verbal instruction, demonstration, patient participation, and written materials to support subject.  Instructors address safety measures for doing things around the house.  Discuss how to get up and down off the floor, how to pick things up properly, how to safely get out of a chair without assistance, and balance training.   Meditation and Mindfulness:  -Group instruction provided by verbal instruction, patient  participation, and written materials to support subject.  Instructor addresses importance of mindfulness and meditation practice to help reduce stress and improve awareness.  Instructor also leads participants through a meditation exercise.    Stretching for Flexibility and Mobility:  -Group instruction provided by verbal instruction, patient participation, and written materials to support subject.  Instructors lead participants through series of stretches that are designed to increase flexibility thus improving mobility.  These stretches are additional exercise for major muscle groups that are typically performed during regular warm up and cool down.   Hands Only CPR:  -Group verbal, video, and participation provides a basic overview of AHA guidelines for community CPR. Role-play of emergencies allow participants the opportunity to practice calling for help and chest compression technique with discussion of AED use.   Hypertension: -Group verbal  and written instruction that provides a basic overview of hypertension including the most recent diagnostic guidelines, risk factor reduction with self-care instructions and medication management.    Nutrition I class: Heart Healthy Eating:  -Group instruction provided by PowerPoint slides, verbal discussion, and written materials to support subject matter. The instructor gives an explanation and review of the Therapeutic Lifestyle Changes diet recommendations, which includes a discussion on lipid goals, dietary fat, sodium, fiber, plant stanol/sterol esters, sugar, and the components of a well-balanced, healthy diet.   Nutrition II class: Lifestyle Skills:  -Group instruction provided by PowerPoint slides, verbal discussion, and written materials to support subject matter. The instructor gives an explanation and review of label reading, grocery shopping for heart health, heart healthy recipe modifications, and ways to make healthier choices when eating  out.   Diabetes Question & Answer:  -Group instruction provided by PowerPoint slides, verbal discussion, and written materials to support subject matter. The instructor gives an explanation and review of diabetes co-morbidities, pre- and post-prandial blood glucose goals, pre-exercise blood glucose goals, signs, symptoms, and treatment of hypoglycemia and hyperglycemia, and foot care basics.   Diabetes Blitz:  -Group instruction provided by PowerPoint slides, verbal discussion, and written materials to support subject matter. The instructor gives an explanation and review of the physiology behind type 1 and type 2 diabetes, diabetes medications and rational behind using different medications, pre- and post-prandial blood glucose recommendations and Hemoglobin A1c goals, diabetes diet, and exercise including blood glucose guidelines for exercising safely.    Portion Distortion:  -Group instruction provided by PowerPoint slides, verbal discussion, written materials, and food models to support subject matter. The instructor gives an explanation of serving size versus portion size, changes in portions sizes over the last 20 years, and what consists of a serving from each food group.   Stress Management:  -Group instruction provided by verbal instruction, video, and written materials to support subject matter.  Instructors review role of stress in heart disease and how to cope with stress positively.     Exercising on Your Own:  -Group instruction provided by verbal instruction, power point, and written materials to support subject.  Instructors discuss benefits of exercise, components of exercise, frequency and intensity of exercise, and end points for exercise.  Also discuss use of nitroglycerin and activating EMS.  Review options of places to exercise outside of rehab.  Review guidelines for sex with heart disease.   Cardiac Drugs I:  -Group instruction provided by verbal instruction and  written materials to support subject.  Instructor reviews cardiac drug classes: antiplatelets, anticoagulants, beta blockers, and statins.  Instructor discusses reasons, side effects, and lifestyle considerations for each drug class.   Cardiac Drugs II:  -Group instruction provided by verbal instruction and written materials to support subject.  Instructor reviews cardiac drug classes: angiotensin converting enzyme inhibitors (ACE-I), angiotensin II receptor blockers (ARBs), nitrates, and calcium channel blockers.  Instructor discusses reasons, side effects, and lifestyle considerations for each drug class.   Anatomy and Physiology of the Circulatory System:  Group verbal and written instruction and models provide basic cardiac anatomy and physiology, with the coronary electrical and arterial systems. Review of: AMI, Angina, Valve disease, Heart Failure, Peripheral Artery Disease, Cardiac Arrhythmia, Pacemakers, and the ICD.   CARDIAC REHAB PHASE II EXERCISE from 09/23/2016 in [facility] CARDIAC REHAB  Date  09/23/16  Educator  RN  Instruction Review Code  2- meets goals/outcomes      Other Education:  -Group  or individual verbal, written, or video instructions that support the educational goals of the cardiac rehab program.   Knowledge Questionnaire Score:     Knowledge Questionnaire Score - 09/17/16 1513      Knowledge Questionnaire Score   Pre Score 22/24      Core Components/Risk Factors/Patient Goals at Admission:     Personal Goals and Risk Factors at Admission - 09/17/16 1519      Core Components/Risk Factors/Patient Goals on Admission   Improve shortness of breath with ADL's Yes   Intervention Provide education, individualized exercise plan and daily activity instruction to help decrease symptoms of SOB with activities of daily living.   Expected Outcomes Short Term: Achieves a reduction of symptoms when performing activities of daily living.   Heart  Failure Yes   Intervention Provide a combined exercise and nutrition program that is supplemented with education, support and counseling about heart failure. Directed toward relieving symptoms such as shortness of breath, decreased exercise tolerance, and extremity edema.   Expected Outcomes Improve functional capacity of life;Short term: Attendance in program 2-3 days a week with increased exercise capacity. Reported lower sodium intake. Reported increased fruit and vegetable intake. Reports medication compliance.;Short term: Daily weights obtained and reported for increase. Utilizing diuretic protocols set by physician.;Long term: Adoption of self-care skills and reduction of barriers for early signs and symptoms recognition and intervention leading to self-care maintenance.   Hypertension Yes   Intervention Provide education on lifestyle modifcations including regular physical activity/exercise, weight management, moderate sodium restriction and increased consumption of fresh fruit, vegetables, and low fat dairy, alcohol moderation, and smoking cessation.;Monitor prescription use compliance.   Expected Outcomes Short Term: Continued assessment and intervention until BP is < 140/61mm HG in hypertensive participants. < 130/38mm HG in hypertensive participants with diabetes, heart failure or chronic kidney disease.;Long Term: Maintenance of blood pressure at goal levels.   Lipids Yes   Intervention Provide education and support for participant on nutrition & aerobic/resistive exercise along with prescribed medications to achieve LDL 70mg , HDL >40mg .   Expected Outcomes Short Term: Participant states understanding of desired cholesterol values and is compliant with medications prescribed. Participant is following exercise prescription and nutrition guidelines.;Long Term: Cholesterol controlled with medications as prescribed, with individualized exercise RX and with personalized nutrition plan. Value goals:  LDL < 70mg , HDL > 40 mg.      Core Components/Risk Factors/Patient Goals Review:      Goals and Risk Factor Review    Row Name 11/04/16 1440             Core Components/Risk Factors/Patient Goals Review   Personal Goals Review Weight Management/Obesity;Heart Failure;Lipids          Core Components/Risk Factors/Patient Goals at Discharge (Final Review):      Goals and Risk Factor Review - 11/04/16 1440      Core Components/Risk Factors/Patient Goals Review   Personal Goals Review Weight Management/Obesity;Heart Failure;Lipids      ITP Comments:     ITP Comments    Row Name 09/17/16 1354           ITP Comments Medical Director, Dr. Armanda Magic          Comments: Melvyn Neth last day of exercise was on 8/6/2018Lewis has been cleared to return to exercise per Dr Shirlee Latch. Cypher plans to return when his transportation issues are resolved.Gladstone Lighter, RN,BSN 11/04/2016 4:28 PM

## 2016-11-04 NOTE — Patient Instructions (Signed)
Routine lab work today. Will notify you of abnormal results, otherwise no news is good news!  Take Losartan 25 mg (1 whole tablet) once daily.  INCREASE Bisoprolol to 5 mg (1 whole tablet) once daily.  INCREASE Paxil to 20 mg once daily.  Stay off Imdur.  Follow up 6 weeks with [name].  ___________________________________________________________  ___________________________________________________________  Take all medication as prescribed the day of your appointment. Bring all medications with you to your appointment.  Do the following things EVERYDAY: 1) Weigh yourself in the morning before breakfast. Write it down and keep it in a log. 2) Take your medicines as prescribed 3) Eat low salt foods-Limit salt (sodium) to 2000 mg per day.  4) Stay as active as you can everyday 5) Limit all fluids for the day to less than 2 liters

## 2016-11-05 NOTE — Telephone Encounter (Signed)
Please call office and schedule appointment for future refills  

## 2016-11-05 NOTE — Progress Notes (Signed)
Cardiology: Dr. Jens Som HF Cardiology: Dr. Shirlee Latch    Advanced Heart Failure Clinic Note   61 yo with history of smoking, CAD s/p multiple PCIs, chronic systolic CHF/ischemic cardiomyopathy presents for HF clinic followup.  Cardiac cath in 9/17 showed stable anatomy, no intervention.  EF has been markedly reduced for several years now.  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 PICC was placed and low cardiac output confirmed.  He was started on milrinone and diuresed.  He had CRT upgrade of his Medtronic device.    He was admitted again in 12/17 with multiple ICD discharges in the setting of VT storm.  He was started on amiodarone and mexiletine.  He has had PTSD symptoms since that time. He has had no further VT.  In the hospital, LV lead was turned off (?pro-arrhythmic).  Coronary angiography was done, showing no change compared to 9/17.    Admitted 1/23 - 03/31/16 with acute on chronic CHF.  BCx drawn with worsening back pain and PICC line in place. Found to have Coag negative staph 2/2 BCx. Started on IV antibiotics. Found to have ICD infection with ulceration and drainage from site.  ICD removed 03/23/16 and replaced 03/27/16 with dual chamber ICD with His bundle pacing. IV ABX continued until 04/06/16.   Mr Solum returns for followup of CHF.   His weight is down 5 lbs.  He is short of breath walking about 200 feet or so.  This is stable to somewhat improved. No orthopnea usually.  No PND.  No further chest pain.   He has cut back on ETOH.  He has been doing cardiac rehab.  He had abdominal discomfort on spironolactone 25 mg daily that resolved when he decreased it back to 12.5 mg daily.   Optivol reviewed: stable thoracic impedance, fluid index < threshold, > 99% His pacing.    Labs (10/17): hgb 15.8, K 4.3, creatinine 1.11 => 0.98, AST 78 => 43, ALT 105 => 61, digoxin 0.4 Labs (12/17): digoxin <  0.5, mg 1.8, K 4.4, creatinine 1.23 Labs (3/18): K 4.4, creatinine 1.28 Labs (4/18): BNP 510 Labs (5/18): K 4.2, creatinine 1.36 Labs (8/18): K 4.1, creatinine 1.57 => 1.76, hgb 11.4, BNP 810, digoxin < 0.2, LFTs normal  PMH: 1. H/o smoking: PFTs (6/17) with minimal obstruction on PFTs. Quit 2/18.  2. Coronary artery disease: Multiple PCIs in the past.  - LHC/RHC (9/17): 85% ostial D2, 99% ostial OM1 (no change from prior) with patent LAD, LCX, OM2, and RCA stents; mean RA 8, PA 58/27 mean 39, mean PCWP 29, CI 2.25, PVR 2.1.  - LHC (12/17): No change compared to 9/17.  3. Chronic systolic CHF: Ischemic cardiomyopathy.   - Echo (10/17): EF 15%, moderate MR, mild to moderate AI, mildly decreased RV systolic function.  - Medtronic CRT-D device => LV lead turned off due to concern for possible pro-arrhythmia.  - CPX (11/17): peak VO2 20.6, VE/VCO2 slope 38, RER 0.97.  Submaximal, probably mild to moderate HF limitation.  - TEE (03/20/16) with LVEF 15% with mod/severe MR.  - RHC (1/18): mean RA 1, PA 36/11, mean PCWP 7, CI 3.06 Fick/3.08 thermo - 1/18 device infection, CRT-D system removed and replaced with a Medtronic ICD with His bundle pacing.  - CPX (5/18): Peak VO2 16.3 (62% predicted), VE/VCO2 slope 44, RER 1.08 => moderate HF limitation.  - Echo (  8/18): EF 20-25%, severe LV dilation, moderate diastolic dysfunction, moderate AI, moderate MR, severe LAE, PASP 49 mmHg.  4. 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  5. H/o TIA 6. Ascending aortic aneurysm: 4.1 cm on CTA chest in 8/17. CTA chest 8/18: No significant aneurysm.  7. Atrial tachycardia: Noted on device interrogation.  8. 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  LV lead turned off due to concern for pro-arrhythmia.  9. Mitral regurgitation: Moderate to severe on 1/18 TEE, likely functional.   ROS: All systems reviewed and negative except as per HPI.   Social History   Social  History  . Marital status: Legally Separated    Spouse name: N/A  . Number of children: N/A  . Years of education: N/A   Occupational History  . Not on file.   Social History Main Topics  . Smoking status: Former Smoker    Packs/day: 1.00    Years: 47.00    Types: Cigarettes    Quit date: 11/20/2015  . Smokeless tobacco: Never Used  . Alcohol use 3.6 oz/week    6 Cans of beer per week  . Drug use: No  . Sexual activity: Not Currently   Other Topics Concern  . Not on file   Social History Narrative   Married; full time.    Family History  Problem Relation Age of Onset  . Cancer Father   . Cancer Sister        twin sister and only one has cancer, unknown what kind  . Hypertension Brother   . Cancer Unknown        family hx  . Heart attack Neg Hx   . Stroke Neg Hx    Current Outpatient Prescriptions  Medication Sig Dispense Refill  . amiodarone (PACERONE) 200 MG tablet TAKE 1 TABLET BY MOUTH EVERY DAY 90 tablet 3  . aspirin EC 81 MG tablet Take 81 mg by mouth daily.    [name]. atorvastatin (LIPITOR) 40 MG tablet TAKE 1 TABLET BY MOUTH EVERY DAY 30 tablet 5  . bisoprolol (ZEBETA) 5 MG tablet Take 1 tablet (5 mg total) by mouth at bedtime. 30 tablet 11  . clopidogrel (PLAVIX) 75 MG tablet Take 1 tablet (75 mg total) by mouth daily. 30 tablet 5  . digoxin (LANOXIN) 0.125 MG tablet Take one tablet (0.125 mg) by mouth once every other day 30 tablet 3  . famotidine (PEPCID) 20 MG tablet Take 1 tablet (20 mg total) by mouth 2 (two) times daily. 60 tablet 6  . furosemide (LASIX) 40 MG tablet Take 80 mg in the AM and 40 mg in the PM 90 tablet 6  . hydrOXYzine (VISTARIL) 25 MG capsule Take 25 mg by mouth at bedtime as needed (sleep).    . losartan (COZAAR) 25 MG tablet Take 1 tablet (25 mg total) by mouth daily. 30 tablet 3  . magnesium oxide (MAG-OX) 400 (241.3 Mg) MG tablet Take 1 tablet (400 mg total) by mouth daily. 90 tablet 2  . NITROSTAT 0.4 MG SL tablet PLACE 1 TABLET (0.4 MG  TOTAL) UNDER THE TONGUE EVERY 5 (FIVE) MINUTES AS NEEDED FOR CHEST PAIN 25 tablet 6  . PARoxetine (PAXIL) 20 MG tablet Take 1 tablet (20 mg total) by mouth daily. 30 tablet 3  . sildenafil (REVATIO) 20 MG tablet Take 1 tablet (20 mg total) by mouth as needed. 10 tablet 0  . spironolactone (ALDACTONE) 25 MG tablet Take 12.5  mg by mouth daily.    [name] triamcinolone cream (KENALOG) 0.1 % USE 1 APPLICATION TOPICALLY TO THE AFFECTED AREA TWICE A DAY AS NEEDED FOR ECZEMA  5  . ALPRAZolam (XANAX) 0.25 MG tablet Take 1 tablet (0.25 mg total) by mouth at bedtime as needed for anxiety. F/u with PCP for refills. 30 tablet 0  . mexiletine (MEXITIL) 200 MG capsule TAKE ONE CAPSULE BY MOUTH EVERY 12 HOURS 60 capsule 3   No current facility-administered medications for this encounter.    BP 114/69 (BP Location: Left Arm, Patient Position: Sitting, Cuff Size: Normal)   Pulse 89   Wt 219 lb (99.3 kg)   SpO2 98%   BMI 29.70 kg/m    Wt Readings from Last 3 Encounters:  11/04/16 219 lb (99.3 kg)  10/14/16 224 lb (101.6 kg)  10/07/16 219 lb 12.8 oz (99.7 kg)    General: NAD Neck: JVP 7-8 cm with HJR, no thyromegaly or thyroid nodule.  Lungs: Clear to auscultation bilaterally with normal respiratory effort. CV: Lateral PMI.  Heart regular S1/S2, no S3/S4, no murmur.  No peripheral edema.  No carotid bruit.  Normal pedal pulses.  Abdomen: Soft, nontender, no hepatosplenomegaly, no distention.  Skin: Intact without lesions or rashes.  Neurologic: Alert and oriented x 3.  Psych: Normal affect. Extremities: No clubbing or cyanosis.  HEENT: Normal.    Assessment/Plan: 1. Chronic systolic CHF: With low cardiac output noted on during admission in 10/17.  Ischemic cardiomyopathy, had upgrade to Medtronic BiV ICD in 10/17.  However, with VT storm in 12/17, the LV lead was turned off due to concerns for pro-arrhythmia.  In 1/18, he developed a device infection and the BiV-ICD was removed and later replaced with a  Medtronic ICD with His bundle pacing.  RHC (1/18) with preserved cardiac output.  TEE (1/18) with EF 15%.  CPX in 5/18 with moderate HF limitation.  Most recent echo in 8/18 with EF 20-25%. NYHA III symptoms currently.  He is not significantly volume overloaded now by exam or Optivol.  - Continue Lasix 80 qam/40 qpm. BMET today.  - Did not tolerate Entresto due to orthostasis.  - Continue losartan 25 mg daily.  - Continue spironolactone 12.5 mg daily (did not tolerate increase due to abdominal discomfort).  - Continue digoxin 0.125 mg every other day. Check level today.   - Increase bisoprolol to 5 mg daily.  2.  CAD: No chest pain.  LHC in 12/17 with stable coronary disease.  - Continue ASA, Plavix, statin.   3. Smoking: has stopped smoking since 2/18.  4. 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  Suspect this contributed to HF with loss of AV synchrony (and also was bradycardic).  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  He was BiV paced after 10/17 admission, but LV lead was turned off due to concern for pro-arrhythmia.  He now has a Medtronic ICD with His bundle pacing.  5. Ascending aortic aneurysm: CTA chest in 8/18 actually did not show a significant aneurysm.  6. Ventricular tachycardia: VT storm in 12/17.   - Continue amiodarone and mexiletine. No VT/VF by device interrogation.  - With amiodarone use, check LFTs and TSH today.  He will need a regular eye exam. 7. PTSD: Patient is on paroxetine due to significant anxiety around shocks.   - Increase to 20 mg daily.   Followup in 6 wks.   Marca Ancona 11/05/2016

## 2016-11-06 ENCOUNTER — Other Ambulatory Visit (HOSPITAL_COMMUNITY): Payer: Self-pay | Admitting: Cardiology

## 2016-11-06 ENCOUNTER — Other Ambulatory Visit: Payer: Self-pay | Admitting: Physician Assistant

## 2016-11-06 ENCOUNTER — Encounter (HOSPITAL_COMMUNITY): Payer: Medicaid Other

## 2016-11-09 ENCOUNTER — Telehealth: Payer: Self-pay | Admitting: Cardiology

## 2016-11-09 ENCOUNTER — Ambulatory Visit (INDEPENDENT_AMBULATORY_CARE_PROVIDER_SITE_OTHER): Payer: Medicaid Other

## 2016-11-09 ENCOUNTER — Other Ambulatory Visit: Payer: Self-pay | Admitting: Physician Assistant

## 2016-11-09 ENCOUNTER — Encounter (HOSPITAL_COMMUNITY): Payer: Medicaid Other

## 2016-11-09 DIAGNOSIS — I5022 Chronic systolic (congestive) heart failure: Secondary | ICD-10-CM

## 2016-11-09 DIAGNOSIS — Z95810 Presence of automatic (implantable) cardiac defibrillator: Secondary | ICD-10-CM

## 2016-11-09 NOTE — Telephone Encounter (Signed)
Confirmed remote transmission w/ pt.   

## 2016-11-10 NOTE — Telephone Encounter (Signed)
This is a CHF pt 

## 2016-11-11 ENCOUNTER — Encounter (HOSPITAL_COMMUNITY)
Admission: RE | Admit: 2016-11-11 | Discharge: 2016-11-11 | Disposition: A | Discharge status: Home / Self Care | Payer: Medicaid Other | Source: Ambulatory Visit

## 2016-11-11 DIAGNOSIS — I5022 Chronic systolic (congestive) heart failure: Secondary | ICD-10-CM

## 2016-11-11 NOTE — Progress Notes (Signed)
Discharge Progress Report  Patient Details  Name: [name] MRN: [id number] Date of Birth: 1955/07/22 Referring Provider:     CARDIAC REHAB PHASE II ORIENTATION from 09/17/2016 in [facility] CARDIAC [facility]  Referring Provider  Marca Ancona MD       Number of Visits: 4  Reason for Discharge:  Early Exit:  Lack of attendance  Smoking History:  History  Smoking Status  . Former Smoker  . Packs/day: 1.00  . Years: 47.00  . Types: Cigarettes  . Quit date: 11/20/2015  Smokeless Tobacco  . Never Used    Diagnosis:  Heart failure, chronic systolic (HCC)  ADL UCSD:   Initial Exercise Prescription:     Initial Exercise Prescription - 09/17/16 1500      Date of Initial Exercise RX and Referring Provider   Date 09/17/16   Referring Provider Marca Ancona MD     Treadmill   MPH 2.5   Grade 0   Minutes 10   METs 2.91     Bike   Level 0.7   Minutes 10   METs 2.32     NuStep   Level 3   SPM 80   Minutes 10   METs 2     Prescription Details   Frequency (times per week) 3   Duration Progress to 30 minutes of continuous aerobic without signs/symptoms of physical distress     Intensity   THRR 40-80% of Max Heartrate 63-126   Ratings of Perceived Exertion 11-15   Perceived Dyspnea 0-4     Progression   Progression Continue to progress workloads to maintain intensity without signs/symptoms of physical distress.     Resistance Training   Training Prescription Yes   Weight 3lbs   Reps 10-15      Discharge Exercise Prescription (Final Exercise Prescription Changes):     Exercise Prescription Changes - 11/03/16 0800      Response to Exercise   Blood Pressure (Admit) 118/62   Blood Pressure (Exercise) 118/60   Blood Pressure (Exit) 122/62   Heart Rate (Admit) 81 bpm   Heart Rate (Exercise) 98 bpm   Heart Rate (Exit) 81 bpm   Rating of Perceived Exertion (Exercise) 15   Symptoms none   Comments Last exercise session on 09/28/16.  Medical issues.   Duration Progress to 30 minutes of  aerobic without signs/symptoms of physical distress   Intensity THRR unchanged     Progression   Progression Continue to progress workloads to maintain intensity without signs/symptoms of physical distress.   Average METs 2.5     Resistance Training   Training Prescription Yes   Weight 3lbs   Reps 10-15     Interval Training   Interval Training No     Treadmill   MPH 2.5   Grade 0   Minutes 10   METs 2.91     Bike   Level 0.7   Minutes 10   METs 2.32     NuStep   Level 3   SPM 80   Minutes 10   METs 2.1      Functional Capacity:     6 Minute Walk    Row Name 09/17/16 1513         6 Minute Walk   Phase Initial     Distance 1440 feet     Walk Time 6 minutes     # of Rest Breaks 0     MPH 2.73  METS 3.1     RPE 14     Perceived Dyspnea  3     VO2 Peak 10.88     Symptoms Yes (comment)     Comments SOB     Resting HR 72 bpm     Resting BP 98/63     Max Ex. HR 97 bpm     Max Ex. BP 126/70     2 Minute Post BP 99/64        Psychological, QOL, Others - Outcomes: PHQ 2/9: Depression screen PHQ 2/9 04/23/2016  Decreased Interest 0  Down, Depressed, Hopeless 0  PHQ - 2 Score 0  Some recent data might be hidden    Quality of Life:     Quality of Life - 09/17/16 1521      Quality of Life Scores   Health/Function Pre 17.6 %   Socioeconomic Pre 27 %   Psych/Spiritual Pre 27.43 %   Family Pre 28.5 %   GLOBAL Pre 22.88 %      Personal Goals: Goals established at orientation with interventions provided to work toward goal.     Personal Goals and Risk Factors at Admission - 09/17/16 1519      Core Components/Risk Factors/Patient Goals on Admission   Improve shortness of breath with ADL's Yes   Intervention Provide education, individualized exercise plan and daily activity instruction to help decrease symptoms of SOB with activities of daily living.   Expected Outcomes Short Term:  Achieves a reduction of symptoms when performing activities of daily living.   Heart Failure Yes   Intervention Provide a combined exercise and nutrition program that is supplemented with education, support and counseling about heart failure. Directed toward relieving symptoms such as shortness of breath, decreased exercise tolerance, and extremity edema.   Expected Outcomes Improve functional capacity of life;Short term: Attendance in program 2-3 days a week with increased exercise capacity. Reported lower sodium intake. Reported increased fruit and vegetable intake. Reports medication compliance.;Short term: Daily weights obtained and reported for increase. Utilizing diuretic protocols set by physician.;Long term: Adoption of self-care skills and reduction of barriers for early signs and symptoms recognition and intervention leading to self-care maintenance.   Hypertension Yes   Intervention Provide education on lifestyle modifcations including regular physical activity/exercise, weight management, moderate sodium restriction and increased consumption of fresh fruit, vegetables, and low fat dairy, alcohol moderation, and smoking cessation.;Monitor prescription use compliance.   Expected Outcomes Short Term: Continued assessment and intervention until BP is < 140/42mm HG in hypertensive participants. < 130/47mm HG in hypertensive participants with diabetes, heart failure or chronic kidney disease.;Long Term: Maintenance of blood pressure at goal levels.   Lipids Yes   Intervention Provide education and support for participant on nutrition & aerobic/resistive exercise along with prescribed medications to achieve LDL 70mg , HDL >40mg .   Expected Outcomes Short Term: Participant states understanding of desired cholesterol values and is compliant with medications prescribed. Participant is following exercise prescription and nutrition guidelines.;Long Term: Cholesterol controlled with medications as  prescribed, with individualized exercise RX and with personalized nutrition plan. Value goals: LDL < , HDL > 40 mg.       Personal Goals Discharge:     Goals and Risk Factor Review    Row Name 11/04/16 1440             Core Components/Risk Factors/Patient Goals Review   Personal Goals Review Weight Management/Obesity;Heart Failure;Lipids          Exercise Goals and  Review:     Exercise Goals    Row Name 09/17/16 1402             Exercise Goals   Increase Physical Activity Yes       Intervention Provide advice, education, support and counseling about physical activity/exercise needs.;Develop an individualized exercise prescription for aerobic and resistive training based on initial evaluation findings, risk stratification, comorbidities and participant's personal goals.       Expected Outcomes Achievement of increased cardiorespiratory fitness and enhanced flexibility, muscular endurance and strength shown through measurements of functional capacity and personal statement of participant.       Increase Strength and Stamina Yes  increase strength and heart function to avoid/delay heart transplant or LVAD       Intervention Provide advice, education, support and counseling about physical activity/exercise needs.;Develop an individualized exercise prescription for aerobic and resistive training based on initial evaluation findings, risk stratification, comorbidities and participant's personal goals.       Expected Outcomes Achievement of increased cardiorespiratory fitness and enhanced flexibility, muscular endurance and strength shown through measurements of functional capacity and personal statement of participant.          Nutrition & Weight - Outcomes:     Pre Biometrics - 09/17/16 1518      Pre Biometrics   Waist Circumference 41 inches   Hip Circumference 42 inches   Waist to Hip Ratio 0.98 %   Triceps Skinfold 26 mm   % Body Fat 31 %   Grip Strength 45.5 kg    Flexibility --  LBP!   Single Leg Stand 20 seconds       Nutrition:     Nutrition Therapy & Goals - 09/23/16 1417      Nutrition Therapy   Diet Therapeutic Lifestyle Changes     Personal Nutrition Goals   Nutrition Goal Wt loss of 1-2 lb/week to a wt loss goal of 6-24 lb at graduation from Cardiac Rehab   Personal Goal #2 Pt to identify and limit food sources of saturated fat, trans fat, and sodium     Intervention Plan   Intervention Prescribe, educate and counsel regarding individualized specific dietary modifications aiming towards targeted core components such as weight, hypertension, lipid management, diabetes, heart failure and other comorbidities.   Expected Outcomes Short Term Goal: Understand basic principles of dietary content, such as calories, fat, sodium, cholesterol and nutrients.;Long Term Goal: Adherence to prescribed nutrition plan.      Nutrition Discharge:     Nutrition Assessments - 09/23/16 1417      MEDFICTS Scores   Pre Score 52      Education Questionnaire Score:     Knowledge Questionnaire Score - 09/17/16 1513      Knowledge Questionnaire Score   Pre Score 22/24      Nethan attended 4 exercise session. Arcadio's last exercise session was on 09/28/2016. Boaz dropped as of today due to transportation issues.Gladstone Lighter, RN,BSN 12/04/2016 12:07 PM

## 2016-11-12 NOTE — Progress Notes (Signed)
EPIC Encounter for ICM Monitoring  Patient Name: [name] is a 61 y.o. male Date: 11/12/2016 Primary Care Physican: Cyndi Bender, PA-C Primary Cardiologist: Crenshaw/McLean Electrophysiologist: Caryl Comes Dry Weight:219lbs  Bi-V Pacing: 100%      Heart Failure questions reviewed, pt reported he can tell he has fluid because he had 4 lb weight gain since yesterday and has some shortness of breath for the past 2 days.    Thoracic impedance normal but was abnormal from 8/31 - 9/10.  Prescribed dosage: Furosemide  40 mg 2 tablets (80 mg total) every AM and 1 tablet 40 mg every PM.  Confirmed this is dosage he is taking today.   Labs: 11/04/2016 Creatinine 1.59, BUN 22, Potassium 3.8, Sodium 137, EGFR 45-52 10/22/2016 Creatinine 1.76, BUN 20, Potassium 4.0, Sodium 139, EGFR 39-46  10/14/2016 Creatinine 1.47, BUN 20, Potassium 4.1, Sodium 139, EGFR 49-57  10/07/2016 Creatinine 1.57, BUN 29, Potassium 4.1, Sodium 139, EGFR 45-52  10/05/2016 Creatinine 1.55, BUN 25, Potassium 4.0, Sodium 138, EGFR 46-53  09/09/2016 Creatinine 1.48, BUN 21, Potassium 4.0, Sodium 138, EGFR 49-56  08/24/2016 Creatinine 1.41, BUN 19, Potassium 4.2, Sodium 140, EGFR 52-60  08/14/2016 Creatinine 1.56, BUN 20, Potassium 4.1, Sodium 137, EGFR 46-53  07/07/2016 Creatinine 1.36, BUN 27, Potassium 4.2, Sodium 139, EGFR 55-64  Recommendations: Copy of ICM check sent to Dr Aundra Dubin and Dr. Caryl Comes for review and recommendations if needed.    Follow-up plan: ICM clinic phone appointment on 12/15/2016.  Office appointment scheduled 12/31/2016 with Dr. Caryl Comes and 12/16/2016 with Dr Aundra Dubin.   3 month ICM trend: 11/10/2016   1 Year ICM trend:      Rosalene Billings, RN 11/12/2016 8:31 AM

## 2016-11-13 ENCOUNTER — Encounter (HOSPITAL_COMMUNITY): Payer: Medicaid Other

## 2016-11-13 NOTE — Progress Notes (Signed)
Call to patient to follow up on symptoms.  Patient reported weight has dropped 1 pound out of the 4 he gained, weight is now 218 lbs.  He stated still has some shortness of breath.  He has self adjusted Furosemide and took extra yesterday and today.  Explained I can not advise him to increase the Furosemide and he understands.  Advised to call on call physician or use the ER this weekend if symptoms worsen.  Will recheck fluid levels on 11/16/2016.

## 2016-11-16 ENCOUNTER — Encounter (HOSPITAL_COMMUNITY): Payer: Medicaid Other

## 2016-11-16 ENCOUNTER — Ambulatory Visit (INDEPENDENT_AMBULATORY_CARE_PROVIDER_SITE_OTHER): Payer: Medicaid Other

## 2016-11-16 DIAGNOSIS — Z95810 Presence of automatic (implantable) cardiac defibrillator: Secondary | ICD-10-CM | POA: Diagnosis not present

## 2016-11-16 DIAGNOSIS — I5022 Chronic systolic (congestive) heart failure: Secondary | ICD-10-CM

## 2016-11-16 NOTE — Progress Notes (Signed)
Patient called back and left message.  He reported PCP thinks the shortness of breath is related more to anxiety and no fluid on exam.  He prescribed anti anxiety medication.  Patient stated he will try that for a few days and will call back if any further assistance is needed.

## 2016-11-16 NOTE — Progress Notes (Signed)
EPIC Encounter for ICM Monitoring  Patient Name: [name] is a 61 y.o. male Date: 11/16/2016 Primary Care Physican: Cyndi Bender, PA-C Primary Cardiologist: Crenshaw/McLean Electrophysiologist: Caryl Comes Dry Weight:215lbs  Bi-V Pacing: 100%        Heart Failure questions reviewed, pt has some shortness of breath that is at rest and during normal activities.  He said the length of time he has shortness of breath varies, as low as 1 minute up to 10 minutes and it comes and goes.  He is questioning if it is anxiety.  He has appointment with PCP today and will call back and provide information if PCP thinks it is fluid.     Thoracic impedance normal.  Prescribed dosage: Furosemide 40 mg 2 tablets (80 mg total) every AM and 1 tablet 40 mg every PM.   Labs: 11/04/2016 Creatinine 1.59, BUN 22, Potassium 3.8, Sodium 137, EGFR 45-52 10/22/2016 Creatinine 1.76, BUN 20, Potassium 4.0, Sodium 139, EGFR 39-46  10/14/2016 Creatinine 1.47, BUN 20, Potassium 4.1, Sodium 139, EGFR 49-57  10/07/2016 Creatinine 1.57, BUN 29, Potassium 4.1, Sodium 139, EGFR 45-52  10/05/2016 Creatinine 1.55, BUN 25, Potassium 4.0, Sodium 138, EGFR 46-53  09/09/2016 Creatinine 1.48, BUN 21, Potassium 4.0, Sodium 138, EGFR 49-56  08/24/2016 Creatinine 1.41, BUN 19, Potassium 4.2, Sodium 140, EGFR 52-60  08/14/2016 Creatinine 1.56, BUN 20, Potassium 4.1, Sodium 137, EGFR 46-53  07/07/2016 Creatinine 1.36, BUN 27, Potassium 4.2, Sodium 139, EGFR 55-64  Recommendations:   Follow-up plan:ICM clinic phone appointment on 12/15/2016.  Office appointment scheduled 12/31/2016 with Dr. Caryl Comes and 12/16/2016 with Dr Aundra Dubin.  Copy of ICM check sent to Dr. Caryl Comes.  3 month ICM trend: 11/16/2016   1 Year ICM trend:      Rosalene Billings, RN 11/16/2016 9:53 AM

## 2016-11-17 ENCOUNTER — Other Ambulatory Visit (HOSPITAL_COMMUNITY): Payer: Self-pay | Admitting: Cardiology

## 2016-11-17 MED ORDER — FAMOTIDINE 20 MG PO TABS: 20.0000 mg | ORAL_TABLET | Freq: Two times a day (BID) | ORAL | 0 refills | Status: AC

## 2016-11-18 ENCOUNTER — Encounter (HOSPITAL_COMMUNITY): Payer: Medicaid Other

## 2016-11-20 ENCOUNTER — Encounter (HOSPITAL_COMMUNITY): Payer: Medicaid Other

## 2016-11-23 ENCOUNTER — Encounter (HOSPITAL_COMMUNITY): Payer: Medicaid Other

## 2016-11-25 ENCOUNTER — Encounter (HOSPITAL_COMMUNITY): Payer: Medicaid Other

## 2016-11-27 ENCOUNTER — Other Ambulatory Visit: Payer: Self-pay | Admitting: Internal Medicine

## 2016-11-27 ENCOUNTER — Encounter (HOSPITAL_COMMUNITY): Payer: Medicaid Other

## 2016-11-30 ENCOUNTER — Encounter (HOSPITAL_COMMUNITY): Payer: Medicaid Other

## 2016-12-02 ENCOUNTER — Encounter (HOSPITAL_COMMUNITY): Payer: Medicaid Other

## 2016-12-04 ENCOUNTER — Encounter (HOSPITAL_COMMUNITY): Payer: Medicaid Other

## 2016-12-04 ENCOUNTER — Other Ambulatory Visit: Payer: Self-pay | Admitting: Internal Medicine

## 2016-12-06 ENCOUNTER — Other Ambulatory Visit (HOSPITAL_COMMUNITY): Payer: Self-pay | Admitting: Cardiology

## 2016-12-07 ENCOUNTER — Encounter (HOSPITAL_COMMUNITY): Payer: Medicaid Other

## 2016-12-07 MED ORDER — LOSARTAN POTASSIUM 25 MG PO TABS: 25.0000 mg | ORAL_TABLET | Freq: Every day | ORAL | 3 refills | Status: DC

## 2016-12-09 ENCOUNTER — Encounter (HOSPITAL_COMMUNITY): Payer: Medicaid Other

## 2016-12-09 ENCOUNTER — Telehealth (HOSPITAL_COMMUNITY): Payer: Self-pay | Admitting: *Deleted

## 2016-12-09 MED ORDER — FUROSEMIDE 40 MG PO TABS: 80.0000 mg | ORAL_TABLET | Freq: Two times a day (BID) | ORAL | 6 refills | Status: DC

## 2016-12-09 MED ORDER — METOLAZONE 2.5 MG PO TABS: 2.5000 mg | ORAL_TABLET | ORAL | 0 refills | Status: AC

## 2016-12-09 NOTE — Telephone Encounter (Signed)
Pt called concerned about wt gain and sob.  He states last week his wt was up about 2-3 lbs so he increased his Lasix to 80 mg BID, but wt has continued to increase and he is up about 7-8 lbs now.  He states he is more SOB and could not even lay flat last night, he had to use 3 pillows to prop himself up to sleep.  He also reports his abd is distended.  Discussed all with Dr [name], he states pt needs to take Metolazone 2.5 mg x1 today continue Lasix 80 mg BID and be seen soon.  Pt is sch for f/u w/him on Wed 10/24, per Dr [name] ok to keep that appt, just have pt call us on Fri with update on how he is doing, pt is aware, agreeable and verbalizes understanding.

## 2016-12-11 ENCOUNTER — Encounter (HOSPITAL_COMMUNITY): Payer: Medicaid Other

## 2016-12-13 ENCOUNTER — Other Ambulatory Visit (HOSPITAL_COMMUNITY): Payer: Self-pay | Admitting: Cardiology

## 2016-12-15 ENCOUNTER — Other Ambulatory Visit (HOSPITAL_COMMUNITY): Payer: Self-pay | Admitting: Cardiology

## 2016-12-15 ENCOUNTER — Encounter: Payer: Self-pay | Admitting: Internal Medicine

## 2016-12-15 ENCOUNTER — Ambulatory Visit (INDEPENDENT_AMBULATORY_CARE_PROVIDER_SITE_OTHER): Payer: Medicaid Other

## 2016-12-15 DIAGNOSIS — Z95810 Presence of automatic (implantable) cardiac defibrillator: Secondary | ICD-10-CM | POA: Diagnosis not present

## 2016-12-15 DIAGNOSIS — I5022 Chronic systolic (congestive) heart failure: Secondary | ICD-10-CM | POA: Diagnosis not present

## 2016-12-15 NOTE — Progress Notes (Signed)
EPIC Encounter for ICM Monitoring  Patient Name: KAIYAN LUCZAK is a 61 y.o. male Date: 12/15/2016 Primary Care Physican: Cyndi Bender, PA-C Primary Cardiologist: Crenshaw/McLean Electrophysiologist: Caryl Comes Dry Weight:217lbs  Bi-V Pacing: 99.8%      Heart Failure questions reviewed, pt asymptomatic today but was symptomatic on 10/17.  Patient called HF clinic 10/17 with symptoms of weight gain, abdominal bloating and shortness of breath. Dr Aundra Dubin ordered Metolazone 2.5 mg x 1 and have follow up appointment scheduled in office 12/16/2016   Thoracic impedance normal but was abnormal suggesting fluid accumulation from 11/24/2016 - 12/09/2016 and was addressed by HF clinic on 10/17.   Prescribed dosage: Furosemide 40 mg 2 tablets (80 mg total) twice a day (changed on 12/09/2016).   Labs: 11/04/2016 Creatinine 1.59, BUN 22, Potassium 3.8, Sodium 137, EGFR 45-52 10/22/2016 Creatinine 1.76, BUN 20, Potassium 4.0, Sodium 139, EGFR 39-46  10/14/2016 Creatinine 1.47, BUN 20, Potassium 4.1, Sodium 139, EGFR 49-57  10/07/2016 Creatinine 1.57, BUN 29, Potassium 4.1, Sodium 139, EGFR 45-52  10/05/2016 Creatinine 1.55, BUN 25, Potassium 4.0, Sodium 138, EGFR 46-53  09/09/2016 Creatinine 1.48, BUN 21, Potassium 4.0, Sodium 138, EGFR 49-56  08/24/2016 Creatinine 1.41, BUN 19, Potassium 4.2, Sodium 140, EGFR 52-60  08/14/2016 Creatinine 1.56, BUN 20, Potassium 4.1, Sodium 137, EGFR 46-53  07/07/2016 Creatinine 1.36, BUN 27, Potassium 4.2, Sodium 139, EGFR 55-64  Recommendations: No changes.  Encouraged to call for fluid symptoms.  Follow-up plan: ICM clinic phone appointment on 02/01/2017.  Office appointment scheduled 12/16/2016 with Dr. Aundra Dubin and 12/31/2016 with Dr Caryl Comes.  Copy of ICM check sent to Dr. Aundra Dubin and Dr. Caryl Comes.   3 month ICM trend: 12/15/2016   1 Year ICM trend:      Rosalene Billings, RN 12/15/2016 9:34 AM

## 2016-12-16 ENCOUNTER — Inpatient Hospital Stay (HOSPITAL_COMMUNITY): Admission: RE | Admit: 2016-12-16 | Payer: Medicaid Other | Source: Ambulatory Visit | Admitting: Cardiology

## 2016-12-16 ENCOUNTER — Encounter (HOSPITAL_COMMUNITY): Payer: Self-pay | Admitting: Cardiology

## 2016-12-16 ENCOUNTER — Ambulatory Visit (HOSPITAL_COMMUNITY)
Admission: RE | Admit: 2016-12-16 | Discharge: 2016-12-16 | Disposition: A | Discharge status: Home / Self Care | Payer: Medicaid Other | Source: Ambulatory Visit | Attending: Cardiology | Admitting: Cardiology

## 2016-12-16 VITALS — BP 118/80 | HR 88 | Wt 220.4 lb

## 2016-12-16 DIAGNOSIS — Z8673 Personal history of transient ischemic attack (TIA), and cerebral infarction without residual deficits: Secondary | ICD-10-CM | POA: Diagnosis not present

## 2016-12-16 DIAGNOSIS — Z79899 Other long term (current) drug therapy: Secondary | ICD-10-CM | POA: Diagnosis not present

## 2016-12-16 DIAGNOSIS — I255 Ischemic cardiomyopathy: Secondary | ICD-10-CM | POA: Diagnosis not present

## 2016-12-16 DIAGNOSIS — I472 Ventricular tachycardia, unspecified: Secondary | ICD-10-CM

## 2016-12-16 DIAGNOSIS — I2510 Atherosclerotic heart disease of native coronary artery without angina pectoris: Secondary | ICD-10-CM

## 2016-12-16 DIAGNOSIS — I4510 Unspecified right bundle-branch block: Secondary | ICD-10-CM | POA: Diagnosis not present

## 2016-12-16 DIAGNOSIS — Z95810 Presence of automatic (implantable) cardiac defibrillator: Secondary | ICD-10-CM | POA: Insufficient documentation

## 2016-12-16 DIAGNOSIS — R001 Bradycardia, unspecified: Secondary | ICD-10-CM | POA: Diagnosis not present

## 2016-12-16 DIAGNOSIS — Z7902 Long term (current) use of antithrombotics/antiplatelets: Secondary | ICD-10-CM | POA: Diagnosis not present

## 2016-12-16 DIAGNOSIS — I340 Nonrheumatic mitral (valve) insufficiency: Secondary | ICD-10-CM | POA: Diagnosis not present

## 2016-12-16 DIAGNOSIS — Z87891 Personal history of nicotine dependence: Secondary | ICD-10-CM | POA: Insufficient documentation

## 2016-12-16 DIAGNOSIS — Z7982 Long term (current) use of aspirin: Secondary | ICD-10-CM | POA: Insufficient documentation

## 2016-12-16 DIAGNOSIS — I5022 Chronic systolic (congestive) heart failure: Secondary | ICD-10-CM | POA: Insufficient documentation

## 2016-12-16 DIAGNOSIS — I441 Atrioventricular block, second degree: Secondary | ICD-10-CM | POA: Insufficient documentation

## 2016-12-16 DIAGNOSIS — E785 Hyperlipidemia, unspecified: Secondary | ICD-10-CM | POA: Diagnosis not present

## 2016-12-16 DIAGNOSIS — I712 Thoracic aortic aneurysm, without rupture: Secondary | ICD-10-CM | POA: Diagnosis not present

## 2016-12-16 DIAGNOSIS — F4310 Post-traumatic stress disorder, unspecified: Secondary | ICD-10-CM | POA: Insufficient documentation

## 2016-12-16 LAB — COMPREHENSIVE METABOLIC PANEL
ALT: 37 U/L (ref 17–63)
ANION GAP: 9 (ref 5–15)
AST: 37 U/L (ref 15–41)
Albumin: 4.2 g/dL (ref 3.5–5.0)
Alkaline Phosphatase: 59 U/L (ref 38–126)
BILIRUBIN TOTAL: 0.7 mg/dL (ref 0.3–1.2)
BUN: 32 mg/dL — AB (ref 6–20)
CHLORIDE: 102 mmol/L (ref 101–111)
CO2: 25 mmol/L (ref 22–32)
Calcium: 9.3 mg/dL (ref 8.9–10.3)
Creatinine, Ser: 1.84 mg/dL — ABNORMAL HIGH (ref 0.61–1.24)
GFR, EST AFRICAN AMERICAN: 43 mL/min — AB (ref >60)
GFR, EST NON AFRICAN AMERICAN: 37 mL/min — AB (ref >60)
Glucose, Bld: 92 mg/dL (ref 65–99)
POTASSIUM: 4.1 mmol/L (ref 3.5–5.1)
Sodium: 136 mmol/L (ref 135–145)
TOTAL PROTEIN: 7.4 g/dL (ref 6.5–8.1)

## 2016-12-16 LAB — DIGOXIN LEVEL

## 2016-12-16 LAB — LIPID PANEL
CHOL/HDL RATIO: 2.9 ratio
CHOLESTEROL: 156 mg/dL (ref 0–200)
HDL: 53 mg/dL (ref >40)
LDL Cholesterol: 56 mg/dL (ref 0–99)
TRIGLYCERIDES: 235 mg/dL — AB (ref <150)
VLDL: 47 mg/dL — ABNORMAL HIGH (ref 0–40)

## 2016-12-16 MED ORDER — SILDENAFIL CITRATE 20 MG PO TABS: 20.0000 mg | ORAL_TABLET | ORAL | 3 refills | Status: AC | PRN

## 2016-12-16 MED ORDER — IVABRADINE HCL 5 MG PO TABS: 5.0000 mg | ORAL_TABLET | Freq: Two times a day (BID) | ORAL | 3 refills | Status: DC

## 2016-12-16 NOTE — Progress Notes (Addendum)
Advanced Heart Failure Medication Review by a Pharmacist  Does the patient  feel that his/her medications are working for him/her?  yes  Has the patient been experiencing any side effects to the medications prescribed?  no  Does the patient measure his/her own blood pressure or blood glucose at home?  yes   Does the patient have any problems obtaining medications due to transportation or finances?   No - [place] Medicaid  Understanding of regimen: good Understanding of indications: good Potential of compliance: good Patient understands to avoid NSAIDs. Patient understands to avoid decongestants.  Issues to address at subsequent visits: None   Pharmacist comments: [name] is a pleasant 61 yo M presenting without a medication list but with good recall of his regimen. He reports good compliance with his regimen. He did admit to reducing his dose of bisoprolol back to 2.5 mg daily from 5 mg daily 2/2 BP in the 80/50s, now back in the 100/70 range. He also only takes furosemide 80 mg daily with an additional 40 mg in the afternoon for abdominal tightness. No other medication-related questions or concerns for me at this time.   Tyler DeisErika K. Bonnye FavaNicolsen, PharmD, BCPS, CPP Clinical Pharmacist Pager: [phone] Phone: [phone] 12/16/2016 3:26 PM      Time with patient: 10 minutes Preparation and documentation time: 2 minutes Total time: 12 minutes

## 2016-12-16 NOTE — Patient Instructions (Signed)
Start Corlanor 5 mg (1 tab), twice a day  Labs drawn today (if we do not call you, then your lab work was stable)   Your physician recommends that you schedule a follow-up appointment in: 2 months with [name]

## 2016-12-17 NOTE — Progress Notes (Signed)
Cardiology: Dr. Jens Somrenshaw HF Cardiology: [name]    Advanced Heart Failure Clinic Note   61 yo with history of smoking, CAD s/p multiple PCIs, chronic systolic CHF/ischemic cardiomyopathy presents for HF clinic followup.  Cardiac cath in 9/17 showed stable anatomy, no intervention.  EF has been markedly reduced for several years now.  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 PICC was placed and low cardiac output confirmed.  He was started on milrinone and diuresed.  He had CRT upgrade of his Medtronic device.    He was admitted again in 12/17 with multiple ICD discharges in the setting of VT storm.  He was started on amiodarone and mexiletine.  He has had PTSD symptoms since that time. He has had no further VT.  In the hospital, LV lead was turned off (?pro-arrhythmic).  Coronary angiography was done, showing no change compared to 9/17.    Admitted 1/23 - 03/31/16 with acute on chronic CHF.  BCx drawn with worsening back pain and PICC line in place. Found to have Coag negative staph 2/2 BCx. Started on IV antibiotics. Found to have ICD infection with ulceration and drainage from site.  ICD removed 03/23/16 and replaced 03/27/16 with dual chamber ICD with His bundle pacing. IV ABX continued until 04/06/16.   [name] returns for followup of CHF.   His weight is stable.  He had increased dyspnea and weight gain last week and took a dose of metolazone.  He now feels back to baseline.  He had to stop cardiac rehab due to transportation issues.  He is now walking 15-20 minutes on a treadmill without dyspnea. No dyspnea generally walking on flat ground.  He is short of breath with moderate to heavy exertion like walking up stairs or a hill.  No orthopnea/PND.  No chest pain.   He has cut back on ETOH.   At last appointment, I increase bisoprolol to 5 mg daily but he developed orthostasis and dose was lowered back  to 2.5 daily.   Medtronic device interrogated reviewed: >99% His bundle pacing, single short episode likely of atrial tachycardia, no VT, fluid index < threshold and stable thoracic impedance.   Labs (10/17): hgb 15.8, K 4.3, creatinine 1.11 => 0.98, AST 78 => 43, ALT 105 => 61, digoxin 0.4 Labs (12/17): digoxin < 0.5, mg 1.8, K 4.4, creatinine 1.23 Labs (3/18): K 4.4, creatinine 1.28 Labs (4/18): BNP 510 Labs (5/18): K 4.2, creatinine 1.36 Labs (8/18): K 4.1, creatinine 1.57 => 1.76, hgb 11.4, BNP 810, digoxin < 0.2, LFTs normal Labs (9/18): TSH normal, digoxin 0.2, K 3.8, creatinine 1.59  PMH: 1. H/o smoking: PFTs (6/17) with minimal obstruction on PFTs. Quit 2/18.  2. Coronary artery disease: Multiple PCIs in the past.  - LHC/RHC (9/17): 85% ostial D2, 99% ostial OM1 (no change from prior) with patent LAD, LCX, OM2, and RCA stents; mean RA 8, PA 58/27 mean 39, mean PCWP 29, CI 2.25, PVR 2.1.  - LHC (12/17): No change compared to 9/17.  3. Chronic systolic CHF: Ischemic cardiomyopathy.   - Echo (10/17): EF 15%, moderate [name], mild to moderate AI, mildly decreased RV systolic function.  - Medtronic CRT-D device => LV lead turned off due to concern for possible pro-arrhythmia.  - CPX (11/17): peak VO2 20.6, VE/VCO2 slope 38, RER 0.97.  Submaximal, probably mild to moderate HF limitation.  -  TEE (03/20/16) with LVEF 15% with mod/severe [name].  - RHC (1/18): mean RA 1, PA 36/11, mean PCWP 7, CI 3.06 Fick/3.08 thermo - 1/18 device infection, CRT-D system removed and replaced with a Medtronic ICD with His bundle pacing.  - CPX (5/18): Peak VO2 16.3 (62% predicted), VE/VCO2 slope 44, RER 1.08 => moderate HF limitation.  - Echo (8/18): EF 20-25%, severe LV dilation, moderate diastolic dysfunction, moderate AI, moderate [name], severe LAE, PASP 49 mmHg.  4. 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  5. H/o TIA 6. Ascending aortic aneurysm: 4.1 cm on CTA chest in 8/17. CTA chest 8/18: No  significant aneurysm.  7. Atrial tachycardia: Noted on device interrogation.  8. 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  LV lead turned off due to concern for pro-arrhythmia.  9. Mitral regurgitation: Moderate to severe on 1/18 TEE, likely functional.   ROS: All systems reviewed and negative except as per HPI.   Social History   Social History  . Marital status: Legally Separated    Spouse name: N/A  . Number of children: N/A  . Years of education: N/A   Occupational History  . Not on file.   Social History Main Topics  . Smoking status: Former Smoker    Packs/day: 1.00    Years: 47.00    Types: Cigarettes    Quit date: 11/20/2015  . Smokeless tobacco: Never Used  . Alcohol use 3.6 oz/week    6 Cans of beer per week  . Drug use: No  . Sexual activity: Not Currently   Other Topics Concern  . Not on file   Social History Narrative   Married; full time.    Family History  Problem Relation Age of Onset  . Cancer Father   . Cancer Sister        twin sister and only one has cancer, unknown what kind  . Hypertension Brother   . Cancer Unknown        family hx  . Heart attack Neg Hx   . Stroke Neg Hx    Current Outpatient Prescriptions  Medication Sig Dispense Refill  . ALPRAZolam (XANAX) 0.25 MG tablet Take 1 tablet (0.25 mg total) by mouth at bedtime as needed for anxiety. F/u with PCP for refills. 30 tablet 0  . amiodarone (PACERONE) 200 MG tablet TAKE 1 TABLET BY MOUTH EVERY DAY 90 tablet 3  . aspirin EC 81 MG tablet Take 81 mg by mouth daily.    [name] atorvastatin (LIPITOR) 40 MG tablet TAKE 1 TABLET BY MOUTH EVERY DAY 30 tablet 5  . bisoprolol (ZEBETA) 5 MG tablet Take 2.5 mg by mouth daily.    . clopidogrel (PLAVIX) 75 MG tablet Take 1 tablet (75 mg total) by mouth daily. 30 tablet 5  . digoxin (LANOXIN) 0.125 MG tablet TAKE 1 TABLET BY MOUTH EVERY OTHER DAY 30 tablet 5  . famotidine (PEPCID) 20 MG tablet Take 1 tablet (20 mg  total) by mouth 2 (two) times daily. Please contact PCP for further refills 60 tablet 0  . furosemide (LASIX) 40 MG tablet Take 80 mg by mouth daily. Take additional 40 mg (1 tablet) in the afternoon for abdominal tightness    . hydrOXYzine (VISTARIL) 25 MG capsule Take 25 mg by mouth at bedtime as needed (sleep).    . losartan (COZAAR) 25 MG tablet Take 1 tablet (25 mg total) by mouth daily. 30 tablet 3  . magnesium oxide (  MAG-OX) 400 (241.3 Mg) MG tablet Take 1 tablet (400 mg total) by mouth daily. 90 tablet 2  . mexiletine (MEXITIL) 200 MG capsule TAKE ONE CAPSULE BY MOUTH EVERY 12 HOURS 60 capsule 3  . PARoxetine (PAXIL) 20 MG tablet Take 1 tablet (20 mg total) by mouth daily. 30 tablet 3  . sildenafil (REVATIO) 20 MG tablet Take 1 tablet (20 mg total) by mouth as needed. 20 tablet 3  . spironolactone (ALDACTONE) 25 MG tablet Take 12.5 mg by mouth daily.    [name] triamcinolone cream (KENALOG) 0.1 % USE 1 APPLICATION TOPICALLY TO THE AFFECTED AREA TWICE A DAY AS NEEDED FOR ECZEMA  5  . ivabradine (CORLANOR) 5 MG TABS tablet Take 1 tablet (5 mg total) by mouth 2 (two) times daily with a meal. 60 tablet 3  . metolazone (ZAROXOLYN) 2.5 MG tablet Take 1 tablet (2.5 mg total) by mouth as directed. (Patient not taking: Reported on 12/16/2016) 5 tablet 0  . NITROSTAT 0.4 MG SL tablet PLACE 1 TABLET (0.4 MG TOTAL) UNDER THE TONGUE EVERY 5 (FIVE) MINUTES AS NEEDED FOR CHEST PAIN (Patient not taking: Reported on 12/16/2016) 25 tablet 6   No current facility-administered medications for this encounter.    BP 118/80   Pulse 88   Wt 220 lb 6.4 oz (100 kg)   SpO2 98%   BMI 29.89 kg/m    Wt Readings from Last 3 Encounters:  12/16/16 220 lb 6.4 oz (100 kg)  11/04/16 219 lb (99.3 kg)  10/14/16 224 lb (101.6 kg)    General: NAD Neck: No JVD, no thyromegaly or thyroid nodule.  Lungs: Clear to auscultation bilaterally with normal respiratory effort. CV: lateral PMI.  Heart regular S1/S2, no S3/S4, no  murmur.  No peripheral edema.  No carotid bruit.  Normal pedal pulses.  Abdomen: Soft, nontender, no hepatosplenomegaly, no distention.  Skin: Intact without lesions or rashes.  Neurologic: Alert and oriented x 3.  Psych: Normal affect. Extremities: No clubbing or cyanosis.  HEENT: Normal.    Assessment/Plan: 1. Chronic systolic CHF: Ischemic cardiomyopathy. With low cardiac output noted on during admission in 10/17.  Ischemic cardiomyopathy, had upgrade to Medtronic BiV ICD in 10/17.  However, with VT storm in 12/17, the LV lead was turned off due to concerns for pro-arrhythmia.  In 1/18, he developed a device infection and the BiV-ICD was removed and later replaced with a Medtronic ICD with His bundle pacing.  RHC (1/18) with preserved cardiac output.  TEE (1/18) with EF 15%.  CPX in 5/18 with moderate HF limitation.  Most recent echo in 8/18 with EF 20-25%. NYHA II-III symptoms currently.  He is not volume overloaded today by exam or Optivol.  Last week, however, he required a dose of metolazone.  - Continue Lasix 80 daily.  With weight gain, he can take an extra 40 mg in the afternoon.  BMET today.  - Did not tolerate Entresto due to orthostasis.  - Continue losartan 25 mg daily, he did not tolerate up-titration.   - Continue spironolactone 12.5 mg daily (did not tolerate increase due to abdominal discomfort).  - Continue digoxin 0.125 mg every other day. Check level today.   - Continue bisoprolol 2.5 daily, he did not tolerate up-titration.  - With HR in upper 80s today, I will add ivabradine 5 mg bid.  2.  CAD: No chest pain.  LHC in 12/17 with stable coronary disease.  - Continue ASA, Plavix, statin.   3. Smoking: has stopped  smoking since 2/18.  4. 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  Suspect this contributed to HF with loss of AV synchrony (and also was bradycardic).  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  He was BiV paced after 10/17 admission, but LV lead  was turned off due to concern for pro-arrhythmia.  He now has a Medtronic ICD with His bundle pacing (>99% on today's device interrogation).  5. Ascending aortic aneurysm: CTA chest in 8/18 actually did not show a significant aneurysm.  6. Ventricular tachycardia: VT storm in 12/17.   - Continue amiodarone and mexiletine. No VT/VF by device interrogation.  - With amiodarone use, check LFTs today, recent TSH normal.  He will need a regular eye exam. 7. PTSD: Patient is on paroxetine due to significant anxiety around shocks.   8. Erectile dysfunction: Will refill Viagra, warned about interaction with NTG.  9. Hyperlipidemia: Check lipids today.   Followup in 2 months.   Marca Ancona 12/17/2016

## 2016-12-18 ENCOUNTER — Telehealth (HOSPITAL_COMMUNITY): Payer: Self-pay | Admitting: *Deleted

## 2016-12-18 DIAGNOSIS — I5022 Chronic systolic (congestive) heart failure: Secondary | ICD-10-CM

## 2016-12-18 NOTE — Telephone Encounter (Signed)
-----   Message from Laurey Moralealton S McLean, MD sent at 12/18/2016  1:40 PM EDT ----- No changes for now but repeat BMET 1 week.

## 2016-12-19 ENCOUNTER — Other Ambulatory Visit (HOSPITAL_COMMUNITY): Payer: Self-pay | Admitting: Cardiology

## 2016-12-25 ENCOUNTER — Ambulatory Visit (HOSPITAL_COMMUNITY)
Admission: RE | Admit: 2016-12-25 | Discharge: 2016-12-25 | Disposition: A | Discharge status: Home / Self Care | Payer: Medicaid Other | Source: Ambulatory Visit | Attending: Cardiology | Admitting: Cardiology

## 2016-12-25 DIAGNOSIS — I5022 Chronic systolic (congestive) heart failure: Secondary | ICD-10-CM | POA: Diagnosis not present

## 2016-12-25 LAB — BASIC METABOLIC PANEL
ANION GAP: 9 (ref 5–15)
BUN: 29 mg/dL — ABNORMAL HIGH (ref 6–20)
CALCIUM: 9.3 mg/dL (ref 8.9–10.3)
CO2: 28 mmol/L (ref 22–32)
Chloride: 97 mmol/L — ABNORMAL LOW (ref 101–111)
Creatinine, Ser: 2.27 mg/dL — ABNORMAL HIGH (ref 0.61–1.24)
GFR, EST AFRICAN AMERICAN: 34 mL/min — AB (ref >60)
GFR, EST NON AFRICAN AMERICAN: 29 mL/min — AB (ref >60)
GLUCOSE: 93 mg/dL (ref 65–99)
Potassium: 4.2 mmol/L (ref 3.5–5.1)
SODIUM: 134 mmol/L — AB (ref 135–145)

## 2016-12-26 ENCOUNTER — Telehealth: Payer: Self-pay | Admitting: Internal Medicine

## 2016-12-26 NOTE — Telephone Encounter (Signed)
Chronic systolic CHF: Ischemic cardiomyopathy. With low cardiac output noted on during admission in 10/17. Ischemic cardiomyopathy, had upgrade to Medtronic BiV ICD in 10/17. However, with VT storm in 12/17, the LV lead was turned off due to concerns for pro-arrhythmia.  In 1/18, he developed a device infection and the BiV-ICD was removed and later replaced with a Medtronic ICD with His bundle pacing.  RHC (1/18) with preserved cardiac output.  TEE (1/18) with EF 15%.  CPX in 5/18 with moderate HF limitation.  Most recent echo in 8/18 with EF 20-25%. NYHA II-III symptoms currently.  He is not volume overloaded today by exam or Optivol.  Last week, however, he required a dose of metolazone.  - Continue Lasix 80 daily.  With weight gain, he can take an extra 40 mg in the afternoon.  BMET today.  - Did not tolerate Entresto due to orthostasis.  - Continue losartan 25 mg daily, he did not tolerate up-titration.   - Continue spironolactone 12.5 mg daily (did not tolerate increase due to abdominal discomfort).  - Continue digoxin 0.125 mg every other day. Check level today.   - Continue bisoprolol 2.5 daily, he did not tolerate up-titration.  - With HR in upper 80s today, I will add ivabradine 5 mg bid.    Patient  Called  He  Took   AMIODARONE  AND  PLAVIX    In  Addition to his  Morning   meds   As  extera  By  Tamala JulianMikstake, I informed  Him to check if  Any  Low  Blood pressure  Or  HR issues or  Bleeding  Of  Any  And  Call  Or  Proceed to ER if any  Symptoms  As  Above  Appear. Overall he  Was asymptomatic

## 2016-12-28 ENCOUNTER — Telehealth (HOSPITAL_COMMUNITY): Payer: Self-pay | Admitting: *Deleted

## 2016-12-28 MED ORDER — LOSARTAN POTASSIUM 25 MG PO TABS: 12.5000 mg | ORAL_TABLET | Freq: Every day | ORAL | 3 refills | Status: DC

## 2016-12-28 MED ORDER — FUROSEMIDE 40 MG PO TABS: 60.0000 mg | ORAL_TABLET | Freq: Every day | ORAL | 3 refills | Status: AC

## 2016-12-28 NOTE — Telephone Encounter (Signed)
-----   Message from Laurey Moralealton S McLean, MD sent at 12/25/2016  5:05 PM EDT ----- Decrease losartan to 12.5 mg daily.  Hold lasix for a day, stop taking pm Lasix.  Resume Lasix at 60 mg daily.

## 2016-12-31 ENCOUNTER — Ambulatory Visit: Payer: Medicaid Other | Admitting: Internal Medicine

## 2016-12-31 ENCOUNTER — Encounter: Payer: Self-pay | Admitting: Internal Medicine

## 2016-12-31 VITALS — BP 110/70 | HR 95 | Ht 71.0 in | Wt 222.0 lb

## 2016-12-31 DIAGNOSIS — T82110A Breakdown (mechanical) of cardiac electrode, initial encounter: Secondary | ICD-10-CM

## 2016-12-31 DIAGNOSIS — I4901 Ventricular fibrillation: Secondary | ICD-10-CM | POA: Diagnosis not present

## 2016-12-31 DIAGNOSIS — I472 Ventricular tachycardia: Secondary | ICD-10-CM | POA: Diagnosis not present

## 2016-12-31 DIAGNOSIS — I255 Ischemic cardiomyopathy: Secondary | ICD-10-CM | POA: Diagnosis not present

## 2016-12-31 DIAGNOSIS — I4729 Other ventricular tachycardia: Secondary | ICD-10-CM

## 2016-12-31 DIAGNOSIS — I442 Atrioventricular block, complete: Secondary | ICD-10-CM

## 2016-12-31 NOTE — Progress Notes (Signed)
Patient Care Team: Lonie Peak, PA-C as PCP - General (Physician Assistant) Jens Som Madolyn Frieze, MD as PCP - Cardiology (Cardiology) Patient, No Pcp Per (General Practice)   HPI  [name] is a 61 y.o. male is seen in followup for ICD implanted as per the MASTER trial with prior myocardial infarction complicated by shock and prior stenting.   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 This was done with amiodarone and lidocaine; later was transitioned to amiodarone    Left heart cath 4/13 >>There is a long stented segment in the proximal to mid LAD is patent. There is diffuse 10-20% narrowing within the stent. The recently placed stent in the mid LAD is widely patent. It jails the ostium of the second diagonal branch with a 90% lesion. Theres is TIMI-3 in the ostium of the D2. Left circumflex (LCx): A left circumflex is a large vessel. There is a 50-60% stenosis in the proximal vessel. He first obtuse marginal vessel is very small in caliber and is sub-totally occluded proximally. The second and third marginal branches are large vessels. There is 30% lesion in ostium of OM-2  Right coronary artery (RCA): The right coronary has an inferior take off. It is a codominant vessel. There is 40% narrowing at the ostium. A stent is noted in the mid right coronary and is widely patent with less than 10-20% irregularities. There is a 30-40% lesion in the distal right coronary. Stable CAD as described above. The D2 is jailed by the recently placed LAD stent but there is good flow He underwent stenting again in July 2014 Cardiac catheterization September 2017 showed an 85% second diagonal (covered by LAD stent; chronic), 99% first marginal (chronic) and patent stents in the proximal to mid LAD, proximal to mid circumflex, second obtuse marginal and mid right coronary artery; medical therapy  recommended.   Echo 10/16  EF 15%   12/17 catheterization was undertaken due to the likelihood of suspicion because of the need to identify a trigger.  Results unchanged from 2017  January 2018 he presented with back pain he was found to have bacteremia and evidence of device infection. He underwent extraction and reimplantation 2/18 with a dual-chamber defibrillator and His bundle pacing      We ended up putting him on Paxil for acute PTSD.   He is emotionally better  He comes in today for routine follow-up.  He notes no significant change in his functional status.  There was a little bit of shortness of breath over the last couple of days that has been brief unassociated with exertion.  He has noted no edema.  He is on corlanor.  He wonders whether it is contributing to his stomach discomfort  Past Medical History:  Diagnosis Date  . Anxiety   . Automatic implantable cardioverter-defibrillator in situ    a. Medtronic ICD.  [name] CAD (coronary artery disease)    a.  Severe LAD stenosis 2/2 acute thrombus - BMS 2009;  b. 06/01/11 Cath - LAD 20 isr, 47m (3.0x16 Veri-flex BMS), OM1 100p, EF 20-25%;  c. 07/2012 Abnl Cardiolite;  d. 08/2012 Cath/PCI: LM nl, LAD patent stents, D2 80-90 (jailed), LCX 90 p/m (4.0x24 Promus Premier DES), OM1 100, OM2 80-90p (3.0x20 Promus Premier DES), RCA patent mid stent, 40d, EF 25%.  e. 10/29/15 LHC stable disease with patent stents  . Chronic systolic CHF (congestive heart failure), NYHA  class 3 (HCC) - low output state 12/12/2014  . Depression   . Dyspnea   . History of rheumatic fever 1962  . HLD (hyperlipidemia) mixed  . HTN (hypertension)   . Ischemic cardiomyopathy    a.  EF 30-35% 2010;  b.  EF 20-25% by LV gram 08/2012. c. EF 15% by echo 11/2014.  [name]. Myocardial infarction [facility](HCC) 1998; 2002; ~ 2010  . Paroxysmal ventricular tachycardia (HCC)    a. VFlutter  CL 210 msec  Rx shock 04/2011  . PVC's (premature ventricular contractions)   . RBBB   . TIA (transient  ischemic attack)    a. Dx 11/2014, continued on ASA/Plavix.    Past Surgical History:  Procedure Laterality Date  . CARDIAC DEFIBRILLATOR PLACEMENT     MDT single chamber primary prevention ICD implanted by Dr Graciela HusbandsKlein as part of MASTER study  . HERNIA REPAIR     "w/gallbladder OR" (08/29/2012)  . IR GENERIC HISTORICAL  03/31/2016   IR US GUIDE VASC ACCESS LEFT 03/31/2016 Simonne ComeJohn Watts, MD [facility]  . IR GENERIC HISTORICAL  03/31/2016   IR FLUORO GUIDE CV MIDLINE PICC LEFT 03/31/2016 Simonne ComeJohn Watts, MD [facility]  . LAPAROSCOPIC CHOLECYSTECTOMY      Current Outpatient Medications  Medication Sig Dispense Refill  . ALPRAZolam (XANAX) 0.25 MG tablet Take 1 tablet (0.25 mg total) by mouth at bedtime as needed for anxiety. F/u with PCP for refills. 30 tablet 0  . amiodarone (PACERONE) 200 MG tablet TAKE 1 TABLET BY MOUTH EVERY DAY 90 tablet 3  . aspirin EC 81 MG tablet Take 81 mg by mouth daily.    [name]. atorvastatin (LIPITOR) 40 MG tablet TAKE 1 TABLET BY MOUTH EVERY DAY 30 tablet 5  . bisoprolol (ZEBETA) 5 MG tablet Take 2.5 mg by mouth daily.    . clopidogrel (PLAVIX) 75 MG tablet Take 1 tablet (75 mg total) by mouth daily. 30 tablet 5  . digoxin (LANOXIN) 0.125 MG tablet TAKE 1 TABLET BY MOUTH EVERY OTHER DAY 30 tablet 5  . famotidine (PEPCID) 20 MG tablet Take 1 tablet (20 mg total) by mouth 2 (two) times daily. Please contact PCP for further refills 60 tablet 0  . furosemide (LASIX) 40 MG tablet Take 1.5 tablets (60 mg total) daily by mouth. 45 tablet 3  . hydrOXYzine (VISTARIL) 25 MG capsule Take 25 mg by mouth at bedtime as needed (sleep).    . ivabradine (CORLANOR) 5 MG TABS tablet Take 1 tablet (5 mg total) by mouth 2 (two) times daily with a meal. 60 tablet 3  . losartan (COZAAR) 25 MG tablet Take 0.5 tablets (12.5 mg total) daily by mouth. 15 tablet 3  . magnesium oxide (MAG-OX) 400 (241.3 Mg) MG tablet Take 1 tablet (400 mg total) by mouth daily. 90 tablet 2  . metolazone (ZAROXOLYN) 2.5 MG  tablet Take 1 tablet (2.5 mg total) by mouth as directed. 5 tablet 0  . mexiletine (MEXITIL) 200 MG capsule TAKE ONE CAPSULE BY MOUTH EVERY 12 HOURS 60 capsule 3  . NITROSTAT 0.4 MG SL tablet PLACE 1 TABLET (0.4 MG TOTAL) UNDER THE TONGUE EVERY 5 (FIVE) MINUTES AS NEEDED FOR CHEST PAIN 25 tablet 6  . PARoxetine (PAXIL) 20 MG tablet Take 1 tablet (20 mg total) by mouth daily. 30 tablet 3  . sildenafil (REVATIO) 20 MG tablet Take 1 tablet (20 mg total) by mouth as needed. 20 tablet 3  . spironolactone (ALDACTONE) 25 MG tablet Take 12.5 mg by  mouth daily.    [name]. triamcinolone cream (KENALOG) 0.1 % USE 1 APPLICATION TOPICALLY TO THE AFFECTED AREA TWICE A DAY AS NEEDED FOR ECZEMA  5   No current facility-administered medications for this visit.     No Known Allergies  Review of Systems negative except from HPI and PMH  Physical Exam BP 110/70   Pulse 95   Ht 5\' 11"  (1.803 m)   Wt 222 lb (100.7 kg)   SpO2 96%   BMI 30.96 kg/m  Well developed and nourished in no acute distress HENT normal Neck supple with JVP-flat Carotids brisk and full without bruits Clear Regular rate and rhythm, no murmurs or gallops Abd-soft with active BS without hepatomegaly No Clubbing cyanosis edema Skin-warm and dry A & Oriented  Grossly normal sensory and motor function   ECG demonstrates Atrial paced rhythm with RV apical pacing.  We have reviewed all ECGs back through May.  QRS morphology changed in July/August with intermediate capture and no there is insignificant capture of his his lead.  And he is RV apically pacing.  Assessment and  Plan  Ischemic cardiomyopathy Without symptoms of ischemia   PTSD  Complete heart block  Ventricular tachycardia Storm  Thought 2/2 LV epicardial pacing   Implantable defibrillator -Medtronic   CRT-D with His bundle pacing and RV apical pacing  \   No interval ventricular tachycardia or nonsustained VT.  We will continue him on amiodarone.    We have  discussed the findings of his his bleed and its loss of capture.  The QRS has widened significantly.  We discussed the possibility that there could be electromechanical interactions wherein worsening LV function might trigger more ventricular tachycardia    The thought with his VT storm had been proarrhythmia 2/2 LV lead   Hence we tried the HIS lead  We would like to try and reposition the His Lead--- we will not place a LV lead .

## 2016-12-31 NOTE — Patient Instructions (Addendum)
Medication Instructions:  Your physician recommends that you continue on your current medications as directed. Please refer to the Current Medication list given to you today.  Labwork: You will get a BMP and a CBC today.  Testing/Procedures: You will be scheduled for a lead revision.    Follow-Up:  You will be scheduled for a follow up with the device clinic 10-14 days after your procedure for a wound check.    You will follow up with Dr. Graciela HusbandsKlein in 91 days after your procedure.   Any Other Special Instructions Will Be Listed Below (If Applicable).  [facility] at:  11:00 am on January 06, 2017.   Do not eat or drink after midnight prior to procedure You may take your morning medications-DO NOT take your ASA or Plavix the morning of your procedure Use the CHG scrub as directed Plan for one night stay You will need someone to drive you home at discharge  If you need a refill on your cardiac medications before your next appointment, please call your pharmacy.

## 2017-01-01 LAB — CUP PACEART INCLINIC DEVICE CHECK
Battery Remaining Longevity: 78 mo
Brady Statistic AP VS Percent: 0.01 %
Brady Statistic AS VS Percent: 0.02 %
Date Time Interrogation Session: 20181108194440
HIGH POWER IMPEDANCE MEASURED VALUE: 66 Ohm
Implantable Lead Location: 753858
Implantable Lead Location: 753859
Implantable Lead Model: 5076
Implantable Pulse Generator Implant Date: 20180202
Lead Channel Impedance Value: 285 Ohm
Lead Channel Impedance Value: 304 Ohm
Lead Channel Pacing Threshold Amplitude: 1 V
Lead Channel Pacing Threshold Amplitude: 1.5 V
Lead Channel Pacing Threshold Pulse Width: 0.4 ms
Lead Channel Sensing Intrinsic Amplitude: 11.5 mV
Lead Channel Setting Pacing Amplitude: 1 V
Lead Channel Setting Pacing Amplitude: 2 V
MDC IDC LEAD IMPLANT DT: 20180202
MDC IDC LEAD IMPLANT DT: 20180202
MDC IDC LEAD IMPLANT DT: 20180202
MDC IDC LEAD LOCATION: 753860
MDC IDC MSMT BATTERY VOLTAGE: 2.97 V
MDC IDC MSMT LEADCHNL LV IMPEDANCE VALUE: 304 Ohm
MDC IDC MSMT LEADCHNL LV IMPEDANCE VALUE: 513 Ohm
MDC IDC MSMT LEADCHNL LV PACING THRESHOLD PULSEWIDTH: 1 ms
MDC IDC MSMT LEADCHNL RA IMPEDANCE VALUE: 399 Ohm
MDC IDC MSMT LEADCHNL RA PACING THRESHOLD AMPLITUDE: 0.5 V
MDC IDC MSMT LEADCHNL RA PACING THRESHOLD PULSEWIDTH: 0.4 ms
MDC IDC MSMT LEADCHNL RA SENSING INTR AMPL: 3.25 mV
MDC IDC MSMT LEADCHNL RA SENSING INTR AMPL: 4.75 mV
MDC IDC MSMT LEADCHNL RV IMPEDANCE VALUE: 456 Ohm
MDC IDC SET LEADCHNL LV PACING PULSEWIDTH: 1.5 ms
MDC IDC SET LEADCHNL RA PACING AMPLITUDE: 1.5 V
MDC IDC SET LEADCHNL RV PACING PULSEWIDTH: 0.4 ms
MDC IDC SET LEADCHNL RV SENSING SENSITIVITY: 0.3 mV
MDC IDC STAT BRADY AP VP PERCENT: 86.05 %
MDC IDC STAT BRADY AS VP PERCENT: 13.93 %
MDC IDC STAT BRADY RA PERCENT PACED: 77.45 %
MDC IDC STAT BRADY RV PERCENT PACED: 99.91 %

## 2017-01-01 LAB — CBC WITH DIFFERENTIAL/PLATELET
Basophils Absolute: 0 10*3/uL (ref 0.0–0.2)
Basos: 0 %
EOS (ABSOLUTE): 0.3 10*3/uL (ref 0.0–0.4)
Eos: 4 %
Hematocrit: 40.4 % (ref 37.5–51.0)
Hemoglobin: 13.3 g/dL (ref 13.0–17.7)
IMMATURE GRANULOCYTES: 0 %
Immature Grans (Abs): 0 10*3/uL (ref 0.0–0.1)
Lymphocytes Absolute: 1.3 10*3/uL (ref 0.7–3.1)
Lymphs: 16 %
MCH: 30.9 pg (ref 26.6–33.0)
MCHC: 32.9 g/dL (ref 31.5–35.7)
MCV: 94 fL (ref 79–97)
MONOS ABS: 0.7 10*3/uL (ref 0.1–0.9)
Monocytes: 8 %
NEUTROS PCT: 72 %
Neutrophils Absolute: 5.9 10*3/uL (ref 1.4–7.0)
PLATELETS: 245 10*3/uL (ref 150–379)
RBC: 4.3 x10E6/uL (ref 4.14–5.80)
RDW: 14.2 % (ref 12.3–15.4)
WBC: 8.3 10*3/uL (ref 3.4–10.8)

## 2017-01-01 LAB — BASIC METABOLIC PANEL
BUN/Creatinine Ratio: 11 (ref 10–24)
BUN: 18 mg/dL (ref 8–27)
CALCIUM: 9.3 mg/dL (ref 8.6–10.2)
CHLORIDE: 97 mmol/L (ref 96–106)
CO2: 26 mmol/L (ref 20–29)
Creatinine, Ser: 1.57 mg/dL — ABNORMAL HIGH (ref 0.76–1.27)
GFR calc non Af Amer: 46 mL/min/{1.73_m2} — ABNORMAL LOW (ref >59)
GFR, EST AFRICAN AMERICAN: 53 mL/min/{1.73_m2} — AB (ref >59)
Glucose: 84 mg/dL (ref 65–99)
POTASSIUM: 4.3 mmol/L (ref 3.5–5.2)
Sodium: 139 mmol/L (ref 134–144)

## 2017-01-04 ENCOUNTER — Other Ambulatory Visit: Payer: Self-pay | Admitting: Internal Medicine

## 2017-01-05 ENCOUNTER — Telehealth (HOSPITAL_COMMUNITY): Payer: Self-pay | Admitting: *Deleted

## 2017-01-05 NOTE — Addendum Note (Signed)
Addended by: Noralee SpaceSCHUB, Kazimir Hartnett M on: 01/05/2017 01:24 PM   Modules accepted: Orders

## 2017-01-05 NOTE — Telephone Encounter (Signed)
Pt called to report that since starting Corlanor on 10/24 he develop severe diarrhea.  He states it lasted until he stopped the medication then it resolved.  Advised pt to remain off Corlanor at this time and I will let Dr [name] know

## 2017-01-05 NOTE — Telephone Encounter (Signed)
Ok stay off Corlanor.

## 2017-01-05 NOTE — Telephone Encounter (Signed)
Left message to call back  

## 2017-01-05 NOTE — Telephone Encounter (Signed)
Patient aware and agreeable to stay off Corlanor. Reminded of his next apt next month. Advised to return call to clinic for any further questions/concerns/issues.  Ave FilterBradley, Megan Genevea, RN

## 2017-01-06 ENCOUNTER — Encounter (HOSPITAL_COMMUNITY): Admission: RE | Payer: Self-pay | Source: Ambulatory Visit

## 2017-01-06 ENCOUNTER — Ambulatory Visit (HOSPITAL_COMMUNITY): Admission: RE | Admit: 2017-01-06 | Payer: Medicaid Other | Source: Ambulatory Visit | Admitting: Internal Medicine

## 2017-01-06 SURGERY — LEAD REVISION/REPAIR

## 2017-01-08 ENCOUNTER — Telehealth: Payer: Self-pay | Admitting: Internal Medicine

## 2017-01-08 NOTE — Telephone Encounter (Signed)
Discussed with Dr Graciela HusbandsKlein and the appointment had been made for 04/13/16.  He wants appointment to be moved up.  Melissa is going to move up and call patient.

## 2017-01-08 NOTE — Telephone Encounter (Signed)
New message     Patient states he was call about rescheduling his procedure from Wednesday ?  Please call

## 2017-01-08 NOTE — Telephone Encounter (Signed)
Returned call to patient and says Dr Graciela HusbandsKlein is canceling his procedure from Wed and wants to see him back in 6 weeks.

## 2017-01-18 ENCOUNTER — Ambulatory Visit: Payer: Medicaid Other

## 2017-02-01 ENCOUNTER — Ambulatory Visit (INDEPENDENT_AMBULATORY_CARE_PROVIDER_SITE_OTHER): Payer: Medicaid Other | Admitting: *Deleted

## 2017-02-01 DIAGNOSIS — I5022 Chronic systolic (congestive) heart failure: Secondary | ICD-10-CM

## 2017-02-01 DIAGNOSIS — Z95810 Presence of automatic (implantable) cardiac defibrillator: Secondary | ICD-10-CM | POA: Diagnosis not present

## 2017-02-01 DIAGNOSIS — I442 Atrioventricular block, complete: Secondary | ICD-10-CM | POA: Diagnosis not present

## 2017-02-01 NOTE — Progress Notes (Signed)
Remote ICD transmission.   

## 2017-02-03 ENCOUNTER — Telehealth (HOSPITAL_COMMUNITY): Payer: Self-pay

## 2017-02-03 NOTE — Telephone Encounter (Signed)
Patient called CHF clinic triage line to report drop in BP with fatigue and "not feeling right". States BP in the 70s. Already spoke with Dr. Graciela HusbandsKlein who advised to stop taking bisoprolol, losratan, and spiro. Will forward to [name] to make him aware as he sees him in clinic in 2 weeks. Advised patient to return call if no better by Friday.  Ave FilterBradley, Megan Genevea, RN

## 2017-02-05 ENCOUNTER — Encounter: Payer: Self-pay | Admitting: Cardiology

## 2017-02-05 LAB — CUP PACEART REMOTE DEVICE CHECK
Battery Voltage: 2.98 V
Brady Statistic AP VS Percent: 0.02 %
Brady Statistic AS VP Percent: 39.72 %
Date Time Interrogation Session: 20181210062207
HIGH POWER IMPEDANCE MEASURED VALUE: 87 Ohm
Implantable Lead Implant Date: 20180202
Implantable Lead Location: 753859
Implantable Lead Location: 753860
Implantable Lead Model: 5076
Lead Channel Impedance Value: 304 Ohm
Lead Channel Impedance Value: 304 Ohm
Lead Channel Impedance Value: 342 Ohm
Lead Channel Impedance Value: 456 Ohm
Lead Channel Pacing Threshold Amplitude: 1 V
Lead Channel Pacing Threshold Pulse Width: 1 ms
Lead Channel Sensing Intrinsic Amplitude: 5 mV
Lead Channel Setting Pacing Amplitude: 1 V
Lead Channel Setting Pacing Amplitude: 2 V
Lead Channel Setting Pacing Pulse Width: 0.4 ms
MDC IDC LEAD IMPLANT DT: 20180202
MDC IDC LEAD IMPLANT DT: 20180202
MDC IDC LEAD LOCATION: 753858
MDC IDC MSMT BATTERY REMAINING LONGEVITY: 77 mo
MDC IDC MSMT LEADCHNL LV IMPEDANCE VALUE: 532 Ohm
MDC IDC MSMT LEADCHNL LV PACING THRESHOLD AMPLITUDE: 1.5 V
MDC IDC MSMT LEADCHNL RA IMPEDANCE VALUE: 418 Ohm
MDC IDC MSMT LEADCHNL RA PACING THRESHOLD AMPLITUDE: 0.375 V
MDC IDC MSMT LEADCHNL RA PACING THRESHOLD PULSEWIDTH: 0.4 ms
MDC IDC MSMT LEADCHNL RA SENSING INTR AMPL: 5 mV
MDC IDC MSMT LEADCHNL RV PACING THRESHOLD PULSEWIDTH: 0.4 ms
MDC IDC MSMT LEADCHNL RV SENSING INTR AMPL: 11.5 mV
MDC IDC PG IMPLANT DT: 20180202
MDC IDC SET LEADCHNL LV PACING PULSEWIDTH: 1.5 ms
MDC IDC SET LEADCHNL RA PACING AMPLITUDE: 1.5 V
MDC IDC SET LEADCHNL RV SENSING SENSITIVITY: 0.3 mV
MDC IDC STAT BRADY AP VP PERCENT: 60.18 %
MDC IDC STAT BRADY AS VS PERCENT: 0.08 %
MDC IDC STAT BRADY RA PERCENT PACED: 60.02 %
MDC IDC STAT BRADY RV PERCENT PACED: 99.73 %

## 2017-02-05 NOTE — Progress Notes (Signed)
EPIC Encounter for ICM Monitoring  Patient Name: [name] is a 61 y.o. male Date: 02/05/2017 Primary Care Physican: Cyndi Bender, PA-C Primary Cardiologist: Crenshaw/McLean Electrophysiologist: Caryl Comes Dry Weight:222lbs (last office weight)  Bi-V Pacing: 99.8%      Transmission received.   Thoracic impedance normal.  Prescribed dosage: Furosemide 40 mg 1.5 tablets (60 mg total) once a day.  Metolazone 2.5 mg Take 1 tablet (2.5 mg total) by mouth as directed.  Labs: 11/04/2016 Creatinine 1.59, BUN 22, Potassium 3.8, Sodium 137, EGFR 45-52 10/22/2016 Creatinine 1.76, BUN 20, Potassium 4.0, Sodium 139, EGFR 39-46  10/14/2016 Creatinine 1.47, BUN 20, Potassium 4.1, Sodium 139, EGFR 49-57  10/07/2016 Creatinine 1.57, BUN 29, Potassium 4.1, Sodium 139, EGFR 45-52  10/05/2016 Creatinine 1.55, BUN 25, Potassium 4.0, Sodium 138, EGFR 46-53  09/09/2016 Creatinine 1.48, BUN 21, Potassium 4.0, Sodium 138, EGFR 49-56  08/24/2016 Creatinine 1.41, BUN 19, Potassium 4.2, Sodium 140, EGFR 52-60  08/14/2016 Creatinine 1.56, BUN 20, Potassium 4.1, Sodium 137, EGFR 46-53  07/07/2016 Creatinine 1.36, BUN 27, Potassium 4.2, Sodium 139, EGFR 55-64  Recommendations: None.  Follow-up plan: ICM clinic phone appointment on 03/22/2017.  Office appointment scheduled 02/18/2017 with Dr. Caryl Comes.  Copy of ICM check sent to Dr. Caryl Comes.   3 month ICM trend: 02/01/2017    1 Year ICM trend:       Rosalene Billings, RN 02/05/2017 6:59 PM

## 2017-02-09 ENCOUNTER — Telehealth: Payer: Self-pay | Admitting: Internal Medicine

## 2017-02-09 NOTE — Telephone Encounter (Signed)
New MEssage  Pt call requesting to speak with RN. Pt states he was instructed to call and give a f/u on how his is currently doing. Pt will wait to discuss upon call back. +

## 2017-02-09 NOTE — Telephone Encounter (Signed)
Pt called to let Dr. Graciela HusbandsKlein know that he feels a lot better after stop taking Bisoprolol, Spironolactone and Losartan on 12/12.

## 2017-02-18 ENCOUNTER — Ambulatory Visit (INDEPENDENT_AMBULATORY_CARE_PROVIDER_SITE_OTHER): Payer: Medicaid Other | Admitting: Internal Medicine

## 2017-02-18 VITALS — BP 125/78 | HR 79 | Ht 71.0 in | Wt 220.0 lb

## 2017-02-18 DIAGNOSIS — I5022 Chronic systolic (congestive) heart failure: Secondary | ICD-10-CM | POA: Diagnosis not present

## 2017-02-18 DIAGNOSIS — I255 Ischemic cardiomyopathy: Secondary | ICD-10-CM

## 2017-02-18 DIAGNOSIS — I442 Atrioventricular block, complete: Secondary | ICD-10-CM | POA: Diagnosis not present

## 2017-02-18 DIAGNOSIS — I472 Ventricular tachycardia: Secondary | ICD-10-CM

## 2017-02-18 DIAGNOSIS — Z95810 Presence of automatic (implantable) cardiac defibrillator: Secondary | ICD-10-CM

## 2017-02-18 DIAGNOSIS — I4729 Other ventricular tachycardia: Secondary | ICD-10-CM

## 2017-02-18 MED ORDER — SPIRONOLACTONE 25 MG PO TABS: 12.5000 mg | ORAL_TABLET | Freq: Every day | ORAL | 5 refills | Status: AC

## 2017-02-18 NOTE — Progress Notes (Signed)
Patient Care Team: Lonie Peak, PA-C as PCP - General (Physician Assistant) Jens Som Madolyn Frieze, MD as PCP - Cardiology (Cardiology) Patient, No Pcp Per (General Practice)   HPI  JVEON POUND is a 61 y.o. male is seen in followup for ICD implanted as per the MASTER trial with prior myocardial infarction complicated by shock and prior stenting.   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 This was done with amiodarone and lidocaine; later was transitioned to amiodarone    Left heart cath 4/13 >>There is a long stented segment in the proximal to mid LAD is patent. There is diffuse 10-20% narrowing within the stent. The recently placed stent in the mid LAD is widely patent. It jails the ostium of the second diagonal branch with a 90% lesion. Theres is TIMI-3 in the ostium of the D2. Left circumflex (LCx): A left circumflex is a large vessel. There is a 50-60% stenosis in the proximal vessel. He first obtuse marginal vessel is very small in caliber and is sub-totally occluded proximally. The second and third marginal branches are large vessels. There is 30% lesion in ostium of OM-2  Right coronary artery (RCA): The right coronary has an inferior take off. It is a codominant vessel. There is 40% narrowing at the ostium. A stent is noted in the mid right coronary and is widely patent with less than 10-20% irregularities. There is a 30-40% lesion in the distal right coronary. Stable CAD as described above. The D2 is jailed by the recently placed LAD stent but there is good flow He underwent stenting again in July 2014 Cardiac catheterization September 2017 showed an 85% second diagonal (covered by LAD stent; chronic), 99% first marginal (chronic) and patent stents in the proximal to mid LAD, proximal to mid circumflex, second obtuse marginal and mid right coronary artery; medical therapy  recommended.   Echo 10/16  EF 15%   12/17 catheterization was undertaken due to the likelihood of suspicion because of the need to identify a trigger.  Results unchanged from 2017  January 2018 he presented with back pain he was found to have bacteremia and evidence of device infection. He underwent extraction and reimplantation 2/18 with a dual-chamber defibrillator and His bundle pacing      We ended up putting him on Paxil for acute PTSD.   He is emotionally better   The LV lead as noted had been inactivated because of concerns of triggering for VT storm.  It was elected to consider His bundle lead revision following increasing thresholds.  After some literature searches, it was elected not to do this and as we did not have an option of LV pacing given its association with VT storm we elected to do nothing for now.  He is on corlanor.  He wonders whether it is contributing to his stomach discomfort  Past Medical History:  Diagnosis Date  . Anxiety   . Automatic implantable cardioverter-defibrillator in situ    a. Medtronic ICD.  [name] CAD (coronary artery disease)    a.  Severe LAD stenosis 2/2 acute thrombus - BMS 2009;  b. 06/01/11 Cath - LAD 20 isr, 72m (3.0x16 Veri-flex BMS), OM1 100p, EF 20-25%;  c. 07/2012 Abnl Cardiolite;  d. 08/2012 Cath/PCI: LM nl, LAD patent stents, D2 80-90 (jailed), LCX 90 p/m (4.0x24 Promus Premier DES), OM1 100, OM2 80-90p (3.0x20 Promus Premier DES), RCA patent mid stent, 40d,  EF 25%.  e. 10/29/15 LHC stable disease with patent stents  . Chronic systolic CHF (congestive heart failure), NYHA class 3 (HCC) - low output state 12/12/2014  . Depression   . Dyspnea   . History of rheumatic fever 1962  . HLD (hyperlipidemia) mixed  . HTN (hypertension)   . Ischemic cardiomyopathy    a.  EF 30-35% 2010;  b.  EF 20-25% by LV gram 08/2012. c. EF 15% by echo 11/2014.  [name]. Myocardial infarction [facility](HCC) 1998; 2002; ~ 2010  . Paroxysmal ventricular tachycardia (HCC)    a.  VFlutter  CL 210 msec  Rx shock 04/2011  . PVC's (premature ventricular contractions)   . RBBB   . TIA (transient ischemic attack)    a. Dx 11/2014, continued on ASA/Plavix.    Past Surgical History:  Procedure Laterality Date  . CARDIAC CATHETERIZATION N/A 10/29/2015   Procedure: Right/Left Heart Cath and Coronary Angiography;  Surgeon: Peter M SwazilandJordan, MD;  [facility];  Service: Cardiovascular;  Laterality: N/A;  . CARDIAC CATHETERIZATION N/A 03/20/2016   Procedure: Right Heart Cath;  Surgeon: Laurey Moralealton S McLean, MD;  [facility];  Service: Cardiovascular;  Laterality: N/A;  . CARDIAC DEFIBRILLATOR PLACEMENT     MDT single chamber primary prevention ICD implanted by Dr Graciela HusbandsKlein as part of MASTER study  . EP IMPLANTABLE DEVICE N/A 12/11/2015   Procedure: BiV Upgrade;  Surgeon: Duke SalviaSteven C Klein, MD;  [facility];  Service: Cardiovascular;  Laterality: N/A;  . EP IMPLANTABLE DEVICE N/A 12/11/2015   Procedure: Pocket Revision/Relocation;  Surgeon: Duke SalviaSteven C Klein, MD;  [facility];  Service: Cardiovascular;  Laterality: N/A;  . EP IMPLANTABLE DEVICE N/A 12/11/2015   Procedure: Lead Revision/Repair;  Surgeon: Duke SalviaSteven C Klein, MD;  [facility];  Service: Cardiovascular;  Laterality: N/A;  . EP IMPLANTABLE DEVICE N/A 03/23/2016   Procedure: Temporary placement of pacemaker lead and generator;  Surgeon: Marinus MawGregg W Taylor, MD;  [facility];  Service: Cardiovascular;  Laterality: N/A;  . EP IMPLANTABLE DEVICE N/A 03/27/2016   Procedure: ICD Implant;  Surgeon: Duke SalviaSteven C Klein, MD;  [facility];  Service: Cardiovascular;  Laterality: N/A;  . GENERATOR REMOVAL N/A 03/23/2016   Procedure: DEVICE REMOVAL;  Surgeon: Marinus MawGregg W Taylor, MD;  [facility];  Service: Cardiovascular;  Laterality: N/A;  PVT to back up  . HERNIA REPAIR     "w/gallbladder OR" (08/29/2012)  . IR GENERIC HISTORICAL  03/31/2016   IR US GUIDE VASC  ACCESS LEFT 03/31/2016 Simonne ComeJohn Watts, MD [facility]-INTER[facility] RAD  . IR GENERIC HISTORICAL  03/31/2016   IR FLUORO GUIDE CV MIDLINE PICC LEFT 03/31/2016 Simonne ComeJohn Watts, MD [facility]  . LAPAROSCOPIC CHOLECYSTECTOMY    . LEFT HEART CATHETERIZATION WITH CORONARY ANGIOGRAM N/A 06/08/2011   Procedure: LEFT HEART CATHETERIZATION WITH CORONARY ANGIOGRAM;  Surgeon: Dolores Pattyaniel R Bensimhon, MD;  [facility];  Service: Cardiovascular;  Laterality: N/A;  . PERCUTANEOUS CORONARY STENT INTERVENTION (PCI-S) N/A 06/01/2011   Procedure: PERCUTANEOUS CORONARY STENT INTERVENTION (PCI-S);  Surgeon: Peter M SwazilandJordan, MD;  [facility];  Service: Cardiovascular;  Laterality: N/A;  . PERCUTANEOUS CORONARY STENT INTERVENTION (PCI-S) N/A 08/29/2012   Procedure: PERCUTANEOUS CORONARY STENT INTERVENTION (PCI-S);  Surgeon: Peter M SwazilandJordan, MD;  [facility];  Service: Cardiovascular;  Laterality: N/A;  . PERIPHERAL VASCULAR CATHETERIZATION  12/11/2015   Procedure: Upper Extremity Venography;  Surgeon: Duke SalviaSteven C Klein,  MD;  [facility];  Service: Cardiovascular;;  . TEE WITHOUT CARDIOVERSION N/A 03/20/2016   Procedure: TRANSESOPHAGEAL ECHOCARDIOGRAM (TEE);  Surgeon: Vesta Mixer, MD;  Location[facility]OSCOPY;  Service: Cardiovascular;  Laterality: N/A;  . TEE WITHOUT CARDIOVERSION N/A 03/23/2016   Procedure: TRANSESOPHAGEAL ECHOCARDIOGRAM (TEE);  Surgeon: Marinus Maw, MD;  Location: [facility] OR;  Service: Cardiovascular;  Laterality: N/A;    Current Outpatient Medications  Medication Sig Dispense Refill  . ALPRAZolam (XANAX) 0.5 MG tablet Take 0.5 mg daily as needed by mouth. For anxiety symptoms.  5  . amiodarone (PACERONE) 200 MG tablet TAKE 1 TABLET BY MOUTH EVERY DAY 90 tablet 3  . aspirin EC 81 MG tablet Take 81 mg by mouth daily.    [name] atorvastatin (LIPITOR) 40 MG tablet TAKE 1 TABLET BY MOUTH EVERY DAY (Patient taking differently: TAKE 1 TABLET BY MOUTH EVERY DAY AT NIGHT) 30 tablet 5  . bisoprolol  (ZEBETA) 5 MG tablet Take 2.5 mg at bedtime by mouth.     . clopidogrel (PLAVIX) 75 MG tablet Take 1 tablet (75 mg total) by mouth daily. 30 tablet 5  . digoxin (LANOXIN) 0.125 MG tablet TAKE 1 TABLET BY MOUTH EVERY OTHER DAY (Patient taking differently: TAKE 1 TABLET BY MOUTH EVERY OTHER DAY AT NIGHT) 30 tablet 5  . famotidine (PEPCID) 20 MG tablet Take 1 tablet (20 mg total) by mouth 2 (two) times daily. Please contact PCP for further refills 60 tablet 0  . furosemide (LASIX) 40 MG tablet Take 1.5 tablets (60 mg total) daily by mouth. 45 tablet 3  . hydrOXYzine (VISTARIL) 25 MG capsule Take 25 mg by mouth at bedtime as needed (sleep).    . magnesium oxide (MAG-OX) 400 (241.3 Mg) MG tablet Take 1 tablet (400 mg total) by mouth daily. 90 tablet 2  . metolazone (ZAROXOLYN) 2.5 MG tablet Take 1 tablet (2.5 mg total) by mouth as directed. (Patient taking differently: Take 2.5 mg daily as needed by mouth (for fluid retention.). ) 5 tablet 0  . mexiletine (MEXITIL) 200 MG capsule TAKE ONE CAPSULE BY MOUTH EVERY 12 HOURS 60 capsule 3  . NITROSTAT 0.4 MG SL tablet PLACE 1 TABLET (0.4 MG TOTAL) UNDER THE TONGUE EVERY 5 (FIVE) MINUTES AS NEEDED FOR CHEST PAIN 25 tablet 6  . PARoxetine (PAXIL) 20 MG tablet Take 1 tablet (20 mg total) by mouth daily. 30 tablet 3  . sildenafil (REVATIO) 20 MG tablet Take 1 tablet (20 mg total) by mouth as needed. (Patient taking differently: Take 20 mg daily as needed by mouth (FOR ED). ) 20 tablet 3  . triamcinolone cream (KENALOG) 0.1 % USE 1 APPLICATION TOPICALLY TO THE AFFECTED AREA TWICE A DAY AS NEEDED FOR ECZEMA  5   No current facility-administered medications for this visit.     Allergies  Allergen Reactions  . Corlanor [Ivabradine] Diarrhea    Review of Systems negative except from HPI and PMH  Physical Exam BP 125/78   Pulse 79   Ht 5\' 11"  (1.803 m)   Wt 220 lb (99.8 kg)   SpO2 96%   BMI 30.68 kg/m  Well developed and nourished in no acute  distress HENT normal Neck supple with JVP-flat Carotids brisk and full without bruits Clear Regular rate and rhythm, no murmurs or gallops Abd-soft with active BS without hepatomegaly No Clubbing cyanosis edema Skin-warm and dry A & Oriented  Grossly normal sensory and motor function   ECG demonstrates Atrial paced  rhythm with RV apical pacing.  We have reviewed all ECGs back through May.  QRS morphology changed in July/August with intermediate capture and no there is insignificant capture of his his lead.  And he is RV apically pacing.  Assessment and  Plan  Ischemic cardiomyopathy Without symptoms of ischemia   PTSD  Complete heart block  Ventricular tachycardia Storm  Thought 2/2 LV epicardial pacing   Implantable defibrillator -Medtronic   CRT-D with His bundle pacing and RV apical pacing  \   Functional status relatively stable.  I have increased his AV delay in the absence of efforts at resynchronization.  This may help.  He is to follow-up with the heart failure clinic in the near future and will defer to those discussions as to whether and when to reconsider repeat efforts at His bundle patient.  High thresholds have been reported up to 35% of patients often they can still be paced.  In more than a third of these patients they cannot.  There is also concerns as to what happens with efforts to reposition or replace the lead given the tendency of this region of the heart to scar   Hence, for now, would have a high threshold prior to efforts to replace the His bundle lead

## 2017-02-18 NOTE — Progress Notes (Signed)
EART 

## 2017-02-18 NOTE — Patient Instructions (Addendum)
Medication Instructions:  Your physician has recommended you make the following change in your medication: -1) RESUME Spironolactone 12.5 mg - Take 0.5 tablet (12.5 mg) by mouth daily  Labwork: None ordered.  Testing/Procedures: None ordered.  Follow-Up: Your physician wants you to follow-up in 4 MONTHS with Dr. Graciela HusbandsKlein  Remote monitoring is used to monitor your ICD from home. This monitoring reduces the number of office visits required to check your device to one time per year. It allows us to keep an eye on the functioning of your device to ensure it is working properly. You are scheduled for a device check from home on 03/22/2017. You may send your transmission at any time that day. If you have a wireless device, the transmission will be sent automatically. After your physician reviews your transmission, you will receive a postcard with your next transmission date.    If you need a refill on your cardiac medications before your next appointment, please call your pharmacy.

## 2017-02-22 ENCOUNTER — Inpatient Hospital Stay (HOSPITAL_COMMUNITY)
Admission: AD | Admit: 2017-02-22 | Discharge: 2017-03-26 | DRG: 001 | Disposition: E | Payer: Medicaid Other | Source: Ambulatory Visit | Attending: Cardiology | Admitting: Cardiology

## 2017-02-22 ENCOUNTER — Telehealth (HOSPITAL_COMMUNITY): Payer: Self-pay

## 2017-02-22 ENCOUNTER — Ambulatory Visit (HOSPITAL_COMMUNITY)
Admission: RE | Admit: 2017-02-22 | Discharge: 2017-02-22 | Disposition: A | Discharge status: Home / Self Care | Payer: Medicaid Other | Source: Ambulatory Visit | Attending: Cardiology | Admitting: Cardiology

## 2017-02-22 ENCOUNTER — Encounter (HOSPITAL_COMMUNITY): Payer: Self-pay | Admitting: Cardiology

## 2017-02-22 VITALS — BP 130/77 | HR 93 | Wt 224.8 lb

## 2017-02-22 DIAGNOSIS — Z7189 Other specified counseling: Secondary | ICD-10-CM | POA: Diagnosis not present

## 2017-02-22 DIAGNOSIS — E782 Mixed hyperlipidemia: Secondary | ICD-10-CM | POA: Diagnosis present

## 2017-02-22 DIAGNOSIS — R0602 Shortness of breath: Secondary | ICD-10-CM

## 2017-02-22 DIAGNOSIS — N179 Acute kidney failure, unspecified: Secondary | ICD-10-CM | POA: Diagnosis not present

## 2017-02-22 DIAGNOSIS — I340 Nonrheumatic mitral (valve) insufficiency: Secondary | ICD-10-CM | POA: Diagnosis not present

## 2017-02-22 DIAGNOSIS — N183 Chronic kidney disease, stage 3 (moderate): Secondary | ICD-10-CM | POA: Diagnosis present

## 2017-02-22 DIAGNOSIS — Y713 Surgical instruments, materials and cardiovascular devices (including sutures) associated with adverse incidents: Secondary | ICD-10-CM | POA: Diagnosis not present

## 2017-02-22 DIAGNOSIS — I255 Ischemic cardiomyopathy: Secondary | ICD-10-CM | POA: Diagnosis present

## 2017-02-22 DIAGNOSIS — I2721 Secondary pulmonary arterial hypertension: Secondary | ICD-10-CM | POA: Diagnosis present

## 2017-02-22 DIAGNOSIS — Z809 Family history of malignant neoplasm, unspecified: Secondary | ICD-10-CM | POA: Diagnosis not present

## 2017-02-22 DIAGNOSIS — R079 Chest pain, unspecified: Secondary | ICD-10-CM | POA: Diagnosis not present

## 2017-02-22 DIAGNOSIS — J9588 Other intraoperative complications of respiratory system, not elsewhere classified: Secondary | ICD-10-CM | POA: Diagnosis not present

## 2017-02-22 DIAGNOSIS — Z8249 Family history of ischemic heart disease and other diseases of the circulatory system: Secondary | ICD-10-CM

## 2017-02-22 DIAGNOSIS — Z7902 Long term (current) use of antithrombotics/antiplatelets: Secondary | ICD-10-CM

## 2017-02-22 DIAGNOSIS — I252 Old myocardial infarction: Secondary | ICD-10-CM | POA: Diagnosis not present

## 2017-02-22 DIAGNOSIS — Z4502 Encounter for adjustment and management of automatic implantable cardiac defibrillator: Secondary | ICD-10-CM | POA: Diagnosis not present

## 2017-02-22 DIAGNOSIS — I4510 Unspecified right bundle-branch block: Secondary | ICD-10-CM | POA: Diagnosis present

## 2017-02-22 DIAGNOSIS — E1122 Type 2 diabetes mellitus with diabetic chronic kidney disease: Secondary | ICD-10-CM | POA: Diagnosis present

## 2017-02-22 DIAGNOSIS — Z8673 Personal history of transient ischemic attack (TIA), and cerebral infarction without residual deficits: Secondary | ICD-10-CM

## 2017-02-22 DIAGNOSIS — J449 Chronic obstructive pulmonary disease, unspecified: Secondary | ICD-10-CM | POA: Diagnosis present

## 2017-02-22 DIAGNOSIS — I712 Thoracic aortic aneurysm, without rupture: Secondary | ICD-10-CM | POA: Diagnosis present

## 2017-02-22 DIAGNOSIS — Z9049 Acquired absence of other specified parts of digestive tract: Secondary | ICD-10-CM | POA: Diagnosis not present

## 2017-02-22 DIAGNOSIS — Z7982 Long term (current) use of aspirin: Secondary | ICD-10-CM

## 2017-02-22 DIAGNOSIS — I5020 Unspecified systolic (congestive) heart failure: Secondary | ICD-10-CM

## 2017-02-22 DIAGNOSIS — I272 Pulmonary hypertension, unspecified: Secondary | ICD-10-CM | POA: Diagnosis not present

## 2017-02-22 DIAGNOSIS — I2510 Atherosclerotic heart disease of native coronary artery without angina pectoris: Secondary | ICD-10-CM | POA: Diagnosis present

## 2017-02-22 DIAGNOSIS — I509 Heart failure, unspecified: Secondary | ICD-10-CM | POA: Diagnosis not present

## 2017-02-22 DIAGNOSIS — Z95810 Presence of automatic (implantable) cardiac defibrillator: Secondary | ICD-10-CM

## 2017-02-22 DIAGNOSIS — I351 Nonrheumatic aortic (valve) insufficiency: Secondary | ICD-10-CM | POA: Diagnosis not present

## 2017-02-22 DIAGNOSIS — I083 Combined rheumatic disorders of mitral, aortic and tricuspid valves: Secondary | ICD-10-CM | POA: Diagnosis present

## 2017-02-22 DIAGNOSIS — I2729 Other secondary pulmonary hypertension: Secondary | ICD-10-CM | POA: Diagnosis present

## 2017-02-22 DIAGNOSIS — I130 Hypertensive heart and chronic kidney disease with heart failure and stage 1 through stage 4 chronic kidney disease, or unspecified chronic kidney disease: Principal | ICD-10-CM | POA: Diagnosis present

## 2017-02-22 DIAGNOSIS — Y838 Other surgical procedures as the cause of abnormal reaction of the patient, or of later complication, without mention of misadventure at the time of the procedure: Secondary | ICD-10-CM | POA: Diagnosis not present

## 2017-02-22 DIAGNOSIS — F17210 Nicotine dependence, cigarettes, uncomplicated: Secondary | ICD-10-CM | POA: Diagnosis present

## 2017-02-22 DIAGNOSIS — Z515 Encounter for palliative care: Secondary | ICD-10-CM | POA: Diagnosis not present

## 2017-02-22 DIAGNOSIS — Z87891 Personal history of nicotine dependence: Secondary | ICD-10-CM

## 2017-02-22 DIAGNOSIS — I442 Atrioventricular block, complete: Secondary | ICD-10-CM | POA: Diagnosis not present

## 2017-02-22 DIAGNOSIS — Z888 Allergy status to other drugs, medicaments and biological substances status: Secondary | ICD-10-CM | POA: Diagnosis not present

## 2017-02-22 DIAGNOSIS — F4310 Post-traumatic stress disorder, unspecified: Secondary | ICD-10-CM | POA: Diagnosis present

## 2017-02-22 DIAGNOSIS — I5043 Acute on chronic combined systolic (congestive) and diastolic (congestive) heart failure: Secondary | ICD-10-CM

## 2017-02-22 DIAGNOSIS — I5023 Acute on chronic systolic (congestive) heart failure: Secondary | ICD-10-CM | POA: Diagnosis present

## 2017-02-22 DIAGNOSIS — I472 Ventricular tachycardia: Secondary | ICD-10-CM | POA: Diagnosis not present

## 2017-02-22 DIAGNOSIS — I951 Orthostatic hypotension: Secondary | ICD-10-CM | POA: Diagnosis not present

## 2017-02-22 DIAGNOSIS — N170 Acute kidney failure with tubular necrosis: Secondary | ICD-10-CM | POA: Diagnosis not present

## 2017-02-22 DIAGNOSIS — J939 Pneumothorax, unspecified: Secondary | ICD-10-CM

## 2017-02-22 DIAGNOSIS — Z01810 Encounter for preprocedural cardiovascular examination: Secondary | ICD-10-CM | POA: Diagnosis not present

## 2017-02-22 LAB — COMPREHENSIVE METABOLIC PANEL
ALK PHOS: 61 U/L (ref 38–126)
ALK PHOS: 57 U/L (ref 38–126)
ALT: 31 U/L (ref 17–63)
ALT: 29 U/L (ref 17–63)
ANION GAP: 11 (ref 5–15)
AST: 38 U/L (ref 15–41)
AST: 35 U/L (ref 15–41)
Albumin: 4.1 g/dL (ref 3.5–5.0)
Albumin: 3.8 g/dL (ref 3.5–5.0)
Anion gap: 12 (ref 5–15)
BILIRUBIN TOTAL: 1.1 mg/dL (ref 0.3–1.2)
BUN: 27 mg/dL — ABNORMAL HIGH (ref 6–20)
BUN: 26 mg/dL — AB (ref 6–20)
CALCIUM: 9.5 mg/dL (ref 8.9–10.3)
CHLORIDE: 96 mmol/L — AB (ref 101–111)
CO2: 25 mmol/L (ref 22–32)
CO2: 28 mmol/L (ref 22–32)
CREATININE: 1.76 mg/dL — AB (ref 0.61–1.24)
Calcium: 8.8 mg/dL — ABNORMAL LOW (ref 8.9–10.3)
Chloride: 97 mmol/L — ABNORMAL LOW (ref 101–111)
Creatinine, Ser: 1.84 mg/dL — ABNORMAL HIGH (ref 0.61–1.24)
GFR calc Af Amer: 46 mL/min — ABNORMAL LOW (ref >60)
GFR calc non Af Amer: 39 mL/min — ABNORMAL LOW (ref >60)
GFR, EST AFRICAN AMERICAN: 43 mL/min — AB (ref >60)
GFR, EST NON AFRICAN AMERICAN: 37 mL/min — AB (ref >60)
Glucose, Bld: 95 mg/dL (ref 65–99)
Glucose, Bld: 89 mg/dL (ref 65–99)
Potassium: 3.6 mmol/L (ref 3.5–5.1)
Potassium: 3.4 mmol/L — ABNORMAL LOW (ref 3.5–5.1)
SODIUM: 133 mmol/L — AB (ref 135–145)
SODIUM: 136 mmol/L (ref 135–145)
TOTAL PROTEIN: 7.7 g/dL (ref 6.5–8.1)
Total Bilirubin: 0.9 mg/dL (ref 0.3–1.2)
Total Protein: 7.2 g/dL (ref 6.5–8.1)

## 2017-02-22 LAB — CBC
HCT: 38.3 % — ABNORMAL LOW (ref 39.0–52.0)
HCT: 36.4 % — ABNORMAL LOW (ref 39.0–52.0)
HEMOGLOBIN: 11.9 g/dL — AB (ref 13.0–17.0)
Hemoglobin: 12.8 g/dL — ABNORMAL LOW (ref 13.0–17.0)
MCH: 31.4 pg (ref 26.0–34.0)
MCH: 30.9 pg (ref 26.0–34.0)
MCHC: 33.4 g/dL (ref 30.0–36.0)
MCHC: 32.7 g/dL (ref 30.0–36.0)
MCV: 94.1 fL (ref 78.0–100.0)
MCV: 94.5 fL (ref 78.0–100.0)
PLATELETS: 281 10*3/uL (ref 150–400)
PLATELETS: 269 10*3/uL (ref 150–400)
RBC: 4.07 MIL/uL — AB (ref 4.22–5.81)
RBC: 3.85 MIL/uL — AB (ref 4.22–5.81)
RDW: 14.9 % (ref 11.5–15.5)
RDW: 15 % (ref 11.5–15.5)
WBC: 7.7 10*3/uL (ref 4.0–10.5)
WBC: 6.9 10*3/uL (ref 4.0–10.5)

## 2017-02-22 LAB — TSH
TSH: 0.625 u[IU]/mL (ref 0.350–4.500)
TSH: 0.629 u[IU]/mL (ref 0.350–4.500)

## 2017-02-22 LAB — TROPONIN I
Troponin I: 0.03 ng/mL (ref <0.03)
Troponin I: <0.03 ng/mL (ref <0.03)
Troponin I: 0.03 ng/mL (ref <0.03)

## 2017-02-22 LAB — MAGNESIUM: MAGNESIUM: 2.2 mg/dL (ref 1.7–2.4)

## 2017-02-22 LAB — BRAIN NATRIURETIC PEPTIDE
B NATRIURETIC PEPTIDE 5: 953.4 pg/mL — AB (ref 0.0–100.0)
B NATRIURETIC PEPTIDE 5: 1016.9 pg/mL — AB (ref 0.0–100.0)

## 2017-02-22 LAB — DIGOXIN LEVEL: Digoxin Level: 0.2 ng/mL — ABNORMAL LOW (ref 0.8–2.0)

## 2017-02-22 MED ORDER — SPIRONOLACTONE 12.5 MG HALF TABLET
12.5000 mg | ORAL_TABLET | Freq: Every day | ORAL | Status: DC
  Administered 2017-02-23 – 2017-03-08 (×14): 12.5 mg via ORAL
  Filled 2017-02-22 (×15): qty 1

## 2017-02-22 MED ORDER — AMIODARONE HCL 200 MG PO TABS
200.0000 mg | ORAL_TABLET | Freq: Every day | ORAL | Status: DC
  Administered 2017-02-23 – 2017-02-24 (×2): 200 mg via ORAL
  Filled 2017-02-22 (×2): qty 1

## 2017-02-22 MED ORDER — MEXILETINE HCL 200 MG PO CAPS
200.0000 mg | ORAL_CAPSULE | Freq: Two times a day (BID) | ORAL | Status: DC
  Administered 2017-02-22 – 2017-03-08 (×29): 200 mg via ORAL
  Filled 2017-02-22 (×31): qty 1

## 2017-02-22 MED ORDER — TAMSULOSIN HCL 0.4 MG PO CAPS: 0.4000 mg | ORAL_CAPSULE | Freq: Every day | ORAL | Status: DC

## 2017-02-22 MED ORDER — SODIUM CHLORIDE 0.9 % IV SOLN
250.0000 mL | INTRAVENOUS | Status: DC | PRN
  Administered 2017-02-25: 250 mL via INTRAVENOUS

## 2017-02-22 MED ORDER — PAROXETINE HCL 20 MG PO TABS
20.0000 mg | ORAL_TABLET | Freq: Every day | ORAL | Status: DC
  Administered 2017-02-23 – 2017-03-08 (×14): 20 mg via ORAL
  Filled 2017-02-22 (×14): qty 1

## 2017-02-22 MED ORDER — TAMSULOSIN HCL 0.4 MG PO CAPS
0.4000 mg | ORAL_CAPSULE | Freq: Every day | ORAL | Status: DC
  Administered 2017-02-22 – 2017-03-08 (×15): 0.4 mg via ORAL
  Filled 2017-02-22 (×15): qty 1

## 2017-02-22 MED ORDER — ENOXAPARIN SODIUM 40 MG/0.4ML ~~LOC~~ SOLN
40.0000 mg | SUBCUTANEOUS | Status: DC
Start: 2017-02-23 — End: 2017-02-24
  Administered 2017-02-23: 40 mg via SUBCUTANEOUS
  Filled 2017-02-22: qty 0.4

## 2017-02-22 MED ORDER — TRIAMCINOLONE ACETONIDE 0.1 % EX CREA
TOPICAL_CREAM | Freq: Two times a day (BID) | CUTANEOUS | Status: DC | PRN
  Filled 2017-02-22: qty 15

## 2017-02-22 MED ORDER — FUROSEMIDE 10 MG/ML IJ SOLN
80.0000 mg | Freq: Two times a day (BID) | INTRAMUSCULAR | Status: DC
  Administered 2017-02-22 – 2017-02-23 (×2): 80 mg via INTRAVENOUS
  Filled 2017-02-22 (×2): qty 8

## 2017-02-22 MED ORDER — MAGNESIUM OXIDE 400 (241.3 MG) MG PO TABS
400.0000 mg | ORAL_TABLET | Freq: Every day | ORAL | Status: DC
  Administered 2017-02-23 – 2017-03-08 (×14): 400 mg via ORAL
  Filled 2017-02-22 (×14): qty 1

## 2017-02-22 MED ORDER — ACETAMINOPHEN 325 MG PO TABS
650.0000 mg | ORAL_TABLET | ORAL | Status: DC | PRN
  Administered 2017-02-24: 650 mg via ORAL
  Filled 2017-02-22 (×2): qty 2

## 2017-02-22 MED ORDER — DIGOXIN 125 MCG PO TABS
125.0000 ug | ORAL_TABLET | ORAL | Status: DC
  Administered 2017-02-23: 125 ug via ORAL
  Filled 2017-02-22: qty 1

## 2017-02-22 MED ORDER — NITROGLYCERIN 0.4 MG SL SUBL: 0.4000 mg | SUBLINGUAL_TABLET | SUBLINGUAL | Status: DC | PRN

## 2017-02-22 MED ORDER — ONDANSETRON HCL 4 MG/2ML IJ SOLN: 4.0000 mg | Freq: Four times a day (QID) | INTRAMUSCULAR | Status: DC | PRN

## 2017-02-22 MED ORDER — SODIUM CHLORIDE 0.9% FLUSH: 3.0000 mL | INTRAVENOUS | Status: DC | PRN

## 2017-02-22 MED ORDER — FAMOTIDINE 20 MG PO TABS
20.0000 mg | ORAL_TABLET | Freq: Two times a day (BID) | ORAL | Status: DC
  Administered 2017-02-22 – 2017-03-08 (×29): 20 mg via ORAL
  Filled 2017-02-22 (×29): qty 1

## 2017-02-22 MED ORDER — SODIUM CHLORIDE 0.9% FLUSH
3.0000 mL | Freq: Two times a day (BID) | INTRAVENOUS | Status: DC
  Administered 2017-02-22 – 2017-03-07 (×21): 3 mL via INTRAVENOUS

## 2017-02-22 MED ORDER — ATORVASTATIN CALCIUM 40 MG PO TABS
40.0000 mg | ORAL_TABLET | Freq: Every day | ORAL | Status: DC
  Administered 2017-02-22 – 2017-03-08 (×15): 40 mg via ORAL
  Filled 2017-02-22 (×15): qty 1

## 2017-02-22 MED ORDER — ASPIRIN EC 81 MG PO TBEC
81.0000 mg | DELAYED_RELEASE_TABLET | Freq: Every day | ORAL | Status: DC
  Administered 2017-02-23 – 2017-03-08 (×13): 81 mg via ORAL
  Filled 2017-02-22 (×13): qty 1

## 2017-02-22 MED ORDER — ALPRAZOLAM 0.5 MG PO TABS
0.5000 mg | ORAL_TABLET | Freq: Every day | ORAL | Status: DC | PRN
  Administered 2017-02-22 – 2017-02-24 (×3): 0.5 mg via ORAL
  Filled 2017-02-22 (×4): qty 1

## 2017-02-22 MED ORDER — CLOPIDOGREL BISULFATE 75 MG PO TABS
75.0000 mg | ORAL_TABLET | Freq: Every day | ORAL | Status: DC
  Administered 2017-02-23 – 2017-02-25 (×3): 75 mg via ORAL
  Filled 2017-02-22 (×3): qty 1

## 2017-02-22 NOTE — Telephone Encounter (Signed)
Critical Lab value reported Trop 0.03 [name] made aware  Ave FilterBradley, Megan Genevea, RN

## 2017-02-22 NOTE — Plan of Care (Signed)
Pt. Understands reason for admission. Is able to ambulate around room without assistance. Pt. Understands to call for assistance if needed to remain free from injury. Call light placed within reach. Pt. Told to call if needed.

## 2017-02-22 NOTE — Progress Notes (Signed)
Pt. Requesting Flomax with night meds. On call for Cardiology paged to make aware.

## 2017-02-22 NOTE — Progress Notes (Signed)
Cardiology: Dr. Jens Somrenshaw HF Cardiology: [name]    Advanced Heart Failure Clinic Note   61 yo with history of smoking, CAD s/p multiple PCIs, chronic systolic CHF/ischemic cardiomyopathy presents for followup of CHF.  Cardiac cath in 9/17 showed stable anatomy, no intervention.  EF has been markedly reduced for several years now.  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 PICC was placed and low cardiac output confirmed.  He was started on milrinone and diuresed.  He had CRT upgrade of his Medtronic device.    He was admitted again in 12/17 with multiple ICD discharges in the setting of VT storm.  He was started on amiodarone and mexiletine.  He has had PTSD symptoms since that time. He has had no further VT.  In the hospital, LV lead was turned off (?pro-arrhythmic).  Coronary angiography was done, showing no change compared to 9/17.    Admitted 1/23 - 03/31/16 with acute on chronic CHF.  BCx drawn with worsening back pain and PICC line in place. Found to have Coag negative staph 2/2 BCx. Started on IV antibiotics. Found to have ICD infection with ulceration and drainage from site.  ICD removed 03/23/16 and replaced 03/27/16 with dual chamber ICD with His bundle pacing. IV ABX continued until 04/06/16.   Since last appointment, he has stopped Corlanor due to diarrhea, then stopped losartan due to low BP and elevated creatinine.  Most recently, Dr. Graciela HusbandsKlein had him stop bisoprolol due to low BP.    He has been seen by Dr. Graciela HusbandsKlein a couple of times recently.  His bundle lead lost capture.  Initially, plan had been to revise the lead but it appears that this was decided against.  He saw Dr. Graciela HusbandsKlein again and had his AV delay increased to try to decrease RV pacing.  Currently, he is in NSR with RV pacing.    Mr. Gwyndolyn SaxonSwift feels bad today.  He says that he started retaining fluid a number of weeks ago but has felt  especially worse since most recent adjustment of his pacemaker on 12/27 (prolonging the AV delay).  He is short of breath walking < 50 feet (just walking around the house) with abdominal swelling and significant orthopnea. For the last two mornings, he has also had episodes of chest heaviness. He increased his Lasix from 80 daily to 80 mg bid for 3 days without much relief.   ECG (personally reviewed): NSR, RV-paced  Labs (10/17): hgb 15.8, K 4.3, creatinine 1.11 => 0.98, AST 78 => 43, ALT 105 => 61, digoxin 0.4 Labs (12/17): digoxin < 0.5, mg 1.8, K 4.4, creatinine 1.23 Labs (3/18): K 4.4, creatinine 1.28 Labs (4/18): BNP 510 Labs (5/18): K 4.2, creatinine 1.36 Labs (8/18): K 4.1, creatinine 1.57 => 1.76, hgb 11.4, BNP 810, digoxin < 0.2, LFTs normal Labs (9/18): TSH normal, digoxin 0.2, K 3.8, creatinine 1.59 Labs (10/18): LDL 56, digoxin < 0.2 Labs (11/18): K 4.3, creatinine 1.57  PMH: 1. H/o smoking: PFTs (6/17) with minimal obstruction on PFTs. Quit 2/18.  2. Coronary artery disease: Multiple PCIs in the past.  - LHC/RHC (9/17): 85% ostial D2, 99% ostial OM1 (no change from prior) with patent LAD, LCX, OM2, and RCA stents; mean RA 8, PA 58/27 mean 39, mean PCWP 29, CI 2.25, PVR 2.1.  - LHC (12/17): No change compared to 9/17.  3. Chronic systolic CHF:  Ischemic cardiomyopathy.   - Echo (10/17): EF 15%, moderate MR, mild to moderate AI, mildly decreased RV systolic function.  - Medtronic CRT-D device => LV lead turned off due to concern for possible pro-arrhythmia.  - CPX (11/17): peak VO2 20.6, VE/VCO2 slope 38, RER 0.97.  Submaximal, probably mild to moderate HF limitation.  - TEE (03/20/16) with LVEF 15% with mod/severe MR.  - RHC (1/18): mean RA 1, PA 36/11, mean PCWP 7, CI 3.06 Fick/3.08 thermo - 1/18 device infection, CRT-D system removed and replaced with a Medtronic ICD with His bundle pacing.  - CPX (5/18): Peak VO2 16.3 (62% predicted), VE/VCO2 slope 44, RER 1.08 => moderate HF  limitation.  - Echo (8/18): EF 20-25%, severe LV dilation, moderate diastolic dysfunction, moderate AI, moderate MR, severe LAE, PASP 49 mmHg.  - Loss of capture of His lead.  4. 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  5. H/o TIA 6. Ascending aortic aneurysm: 4.1 cm on CTA chest in 8/17. CTA chest 8/18: No significant aneurysm.  7. Atrial tachycardia: Noted on device interrogation.  8. 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  LV lead turned off due to concern for pro-arrhythmia.  9. Mitral regurgitation: Moderate to severe on 1/18 TEE, likely functional.   ROS: All systems reviewed and negative except as per HPI.   Social History   Socioeconomic History  . Marital status: Legally Separated    Spouse name: Not on file  . Number of children: Not on file  . Years of education: Not on file  . Highest education level: Not on file  Social Needs  . Financial resource strain: Not on file  . Food insecurity - worry: Not on file  . Food insecurity - inability: Not on file  . Transportation needs - medical: Not on file  . Transportation needs - non-medical: Not on file  Occupational History  . Not on file  Tobacco Use  . Smoking status: Former Smoker    Packs/day: 1.00    Years: 47.00    Pack years: 47.00    Types: Cigarettes    Last attempt to quit: 11/20/2015    Years since quitting: 1.2  . Smokeless tobacco: Never Used  Substance and Sexual Activity  . Alcohol use: Yes    Alcohol/week: 3.6 oz    Types: 6 Cans of beer per week  . Drug use: No  . Sexual activity: Not Currently  Other Topics Concern  . Not on file  Social History Narrative   Married; full time.    Family History  Problem Relation Age of Onset  . Cancer Father   . Cancer Sister        twin sister and only one has cancer, unknown what kind  . Hypertension Brother   . Cancer Unknown        family hx  . Heart attack Neg Hx   . Stroke Neg Hx    Current  Outpatient Medications  Medication Sig Dispense Refill  . ALPRAZolam (XANAX) 0.5 MG tablet Take 0.5 mg daily as needed by mouth. For anxiety symptoms.  5  . amiodarone (PACERONE) 200 MG tablet TAKE 1 TABLET BY MOUTH EVERY DAY 90 tablet 3  . aspirin EC 81 MG tablet Take 81 mg by mouth daily.    [name] atorvastatin (LIPITOR) 40 MG tablet TAKE 1 TABLET BY MOUTH EVERY DAY (Patient taking differently: TAKE 1 TABLET BY MOUTH EVERY DAY AT NIGHT) 30 tablet  5  . clopidogrel (PLAVIX) 75 MG tablet Take 1 tablet (75 mg total) by mouth daily. 30 tablet 5  . digoxin (LANOXIN) 0.125 MG tablet TAKE 1 TABLET BY MOUTH EVERY OTHER DAY (Patient taking differently: TAKE 1 TABLET BY MOUTH EVERY OTHER DAY AT NIGHT) 30 tablet 5  . famotidine (PEPCID) 20 MG tablet Take 1 tablet (20 mg total) by mouth 2 (two) times daily. Please contact PCP for further refills 60 tablet 0  . furosemide (LASIX) 40 MG tablet Take 80 mg by mouth daily.    . hydrOXYzine (VISTARIL) 25 MG capsule Take 25 mg by mouth at bedtime as needed (sleep).    . magnesium oxide (MAG-OX) 400 (241.3 Mg) MG tablet Take 1 tablet (400 mg total) by mouth daily. 90 tablet 2  . metolazone (ZAROXOLYN) 2.5 MG tablet Take 1 tablet (2.5 mg total) by mouth as directed. (Patient taking differently: Take 2.5 mg daily as needed by mouth (for fluid retention.). ) 5 tablet 0  . mexiletine (MEXITIL) 200 MG capsule TAKE ONE CAPSULE BY MOUTH EVERY 12 HOURS 60 capsule 3  . NITROSTAT 0.4 MG SL tablet PLACE 1 TABLET (0.4 MG TOTAL) UNDER THE TONGUE EVERY 5 (FIVE) MINUTES AS NEEDED FOR CHEST PAIN 25 tablet 6  . PARoxetine (PAXIL) 20 MG tablet Take 1 tablet (20 mg total) by mouth daily. 30 tablet 3  . sildenafil (REVATIO) 20 MG tablet Take 1 tablet (20 mg total) by mouth as needed. (Patient taking differently: Take 20 mg daily as needed by mouth (FOR ED). ) 20 tablet 3  . spironolactone (ALDACTONE) 25 MG tablet Take 0.5 tablets (12.5 mg total) by mouth daily. 15 tablet 5  . triamcinolone  cream (KENALOG) 0.1 % USE 1 APPLICATION TOPICALLY TO THE AFFECTED AREA TWICE A DAY AS NEEDED FOR ECZEMA  5  . bisoprolol (ZEBETA) 5 MG tablet Take 2.5 mg at bedtime by mouth.     . furosemide (LASIX) 40 MG tablet Take 1.5 tablets (60 mg total) daily by mouth. (Patient not taking: Reported on 02/13/2017) 45 tablet 3   No current facility-administered medications for this encounter.    BP 130/77 (BP Location: Left Arm, Patient Position: Sitting, Cuff Size: Normal)   Pulse 93   Wt 224 lb 12.8 oz (102 kg)   SpO2 97%   BMI 31.35 kg/m    Wt Readings from Last 3 Encounters:  02/16/2017 224 lb 12.8 oz (102 kg)  02/18/17 220 lb (99.8 kg)  12/31/16 222 lb (100.7 kg)    General: NAD Neck: JVP 12-14, no thyromegaly or thyroid nodule.  Lungs: Clear to auscultation bilaterally with normal respiratory effort. CV: Lateral PMI.  Heart regular S1/S2, no S3/S4, 3/6 HSM apex.  1+ ankle edema.  No carotid bruit.  Normal pedal pulses.  Abdomen: Soft, nontender, no hepatosplenomegaly, moderate abdominal distention.  Skin: Intact without lesions or rashes.  Neurologic: Alert and oriented x 3.  Psych: Normal affect. Extremities: No clubbing or cyanosis.  HEENT: Normal.   Assessment/Plan: 1. Acute on chronic systolic CHF: Ischemic cardiomyopathy. With low cardiac output noted on during admission in 10/17.  Had upgrade to Medtronic BiV ICD in 10/17.  However, with VT storm in 12/17, the LV lead was turned off due to concerns for pro-arrhythmia.  In 1/18, he developed a device infection and the BiV-ICD was removed and later replaced with a Medtronic ICD with His bundle pacing.  RHC (1/18) with preserved cardiac output.  TEE (1/18) with EF 15%.  CPX in  5/18 with moderate HF limitation.  Most recent echo in 8/18 with EF 20-25%. Most recently, he has lost capture of His bundle lead and AV delay was increased to limit RV pacing.  Today, he is RV paced.  He feels markedly worse, NYHA class IIIb symptoms.  He is volume  overloaded on exam and increasing Lasix at home has not appreciably helped.   - He needs to be admitted for IV diuretics, will give Lasix 80 mg IV bid to start with.  - Symptoms markedly worse since lost of His bundle pacing, will discuss options with Dr. Graciela HusbandsKlein as we are not going to BiV pace him due to suspected pro-arrhythmia.  Can we revise his His bundle lead?  - Did not tolerate Entresto due to orthostasis, has been taken off losartan with elevated creatinine and low BP.  BP not low today, will follow to see if we can restart during this admission.   - Continue spironolactone 12.5 mg daily (did not tolerate increase due to abdominal discomfort).  - Continue digoxin 0.125 mg every other day. Check level today.   - He is off bisoprolol, will leave off for now with concern for low output.  - He did not tolerate Corlanor due to diarrhea.  - He will likely need RHC/LHC in hospital, I am concerned for low output with loss of His bundle pacing.  If we are unable to resynchronize him in the future, this may be problematic.  He would be an LVAD candidate.   2.  CAD: LHC in 12/17 with stable coronary disease. He has had mild chest discomfort in setting of volume overload.  - Continue ASA, Plavix, statin.  Good lipids 10/18. - Will cycle troponin.  As above, possible RHC/LHC this admission.  3. Smoking: has stopped smoking since 2/18.  4. 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  Suspect this contributed to HF with loss of AV synchrony (and also was bradycardic).  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  He was BiV paced after 10/17 admission, but LV lead was turned off due to concern for pro-arrhythmia.  He now has a Medtronic ICD and had been His bundle pacing, but has lost capture in His lead and is now NSR with RV pacing.  See above discussion, concerned that loss of synchrony is a significant problem for him.  5. Ascending aortic aneurysm: CTA chest in 8/18 actually did not show a  significant aneurysm.  6. Ventricular tachycardia: VT storm in 12/17.   - Continue amiodarone and mexiletine. No VT/VF by device interrogation.  - With amiodarone use, check LFTs and TSH today.  He will need a regular eye exam. 7. PTSD: Patient is on paroxetine due to significant anxiety around shocks.     Marca AnconaDalton Halona Amstutz 02/19/2017

## 2017-02-22 NOTE — H&P (Signed)
Advanced Heart Failure Team History and Physical Note  Primary Cardiologist:  Dr. Shirlee Latch   Reason for Admission: A/C systolic CHF  HPI:    [name] is a 61 y.o. 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 PICC was placed and low cardiac output confirmed.  He was started on milrinone and diuresed.  He had CRT upgrade of his Medtronic device.    He was admitted again in 12/17 with multiple ICD discharges in the setting of VT storm.  He was started on amiodarone and mexiletine.  He has had PTSD symptoms since that time. He has had no further VT.  In the hospital, LV lead was turned off (?pro-arrhythmic).  Coronary angiography was done, showing no change compared to 9/17.    Admitted 1/23 - 03/31/16 with acute on chronic CHF.  BCx drawn with worsening back pain and PICC line in place. Found to have Coag negative staph 2/2 BCx. Started on IV antibiotics. Found to have ICD infection with ulceration and drainage from site.  ICD removed 03/23/16 and replaced 03/27/16 with dual chamber ICD with His bundle pacing. IV ABX continued until 04/06/16.   Since last appointment, he has stopped Corlanor due to diarrhea, then stopped losartan due to low BP and elevated creatinine.  Most recently, Dr. Graciela Husbands had him stop bisoprolol due to low BP.    He has been seen by Dr. Graciela Husbands a couple of times recently.  His bundle lead lost capture.  Initially, plan had been to revise the lead but it appears that this was decided against.  He saw Dr. Graciela Husbands again and had his AV delay increased to try to decrease RV pacing.  Currently, he is in NSR with RV pacing.    Presented to clinic today with complaints of retaining fluid with SOB walking < 50 feet along with worsening abdominal  swelling and significant orthopnea.  Has had accompanying chest heaviness the past several days.  He has doubled his lasix over the weekend without any relieve.  He feels he has worsened noticeably since his AV delay was prolonged on 02/18/17 at an EP visit.   Review of systems complete and found to be negative unless listed in HPI.    Home Medications Prior to Admission medications   Medication Sig Start Date End Date Taking? Authorizing Provider  ALPRAZolam Prudy Feeler) 0.5 MG tablet Take 0.5 mg daily as needed by mouth. For anxiety symptoms. 12/15/16   [provider]  amiodarone (PACERONE) 200 MG tablet TAKE 1 TABLET BY MOUTH EVERY DAY 08/18/16   Laurey Morale, MD  aspirin EC 81 MG tablet Take 81 mg by mouth daily.    [provider]  atorvastatin (LIPITOR) 40 MG tablet TAKE 1 TABLET BY MOUTH EVERY DAY Patient taking differently: TAKE 1 TABLET BY MOUTH EVERY DAY AT NIGHT 10/21/16   [name], MD  bisoprolol (ZEBETA) 5 MG tablet Take 2.5 mg at bedtime by mouth.     [provider]  clopidogrel (PLAVIX) 75 MG tablet Take 1 tablet (75 mg total) by mouth daily. 10/14/16   Laurey Morale, MD  digoxin (LANOXIN) 0.125 MG tablet TAKE 1 TABLET BY MOUTH EVERY OTHER DAY Patient  taking differently: TAKE 1 TABLET BY MOUTH EVERY OTHER DAY AT NIGHT 11/05/16   Duke SalviaKlein, Steven C, MD  famotidine (PEPCID) 20 MG tablet Take 1 tablet (20 mg total) by mouth 2 (two) times daily. Please contact PCP for further refills 11/17/16   Laurey MoraleMcLean, Dalton S, MD  furosemide (LASIX) 40 MG tablet Take 1.5 tablets (60 mg total) daily by mouth. Patient not taking: Reported on 01/28/2017 12/28/16   Laurey MoraleMcLean, Dalton S, MD  furosemide (LASIX) 40 MG tablet Take 80 mg by mouth daily.    [provider]  hydrOXYzine (VISTARIL) 25 MG capsule Take 25 mg by mouth at bedtime as needed (sleep).    [provider]  magnesium oxide (MAG-OX) 400 (241.3 Mg) MG tablet Take 1 tablet (400 mg total) by mouth  daily. 09/29/16   Duke SalviaKlein, Steven C, MD  metolazone (ZAROXOLYN) 2.5 MG tablet Take 1 tablet (2.5 mg total) by mouth as directed. Patient taking differently: Take 2.5 mg daily as needed by mouth (for fluid retention.).  12/09/16 03/12/2017  Laurey MoraleMcLean, Dalton S, MD  mexiletine (MEXITIL) 200 MG capsule TAKE ONE CAPSULE BY MOUTH EVERY 12 HOURS 11/04/16   Duke SalviaKlein, Steven C, MD  NITROSTAT 0.4 MG SL tablet PLACE 1 TABLET (0.4 MG TOTAL) UNDER THE TONGUE EVERY 5 (FIVE) MINUTES AS NEEDED FOR CHEST PAIN 04/23/16   Duke SalviaKlein, Steven C, MD  PARoxetine (PAXIL) 20 MG tablet Take 1 tablet (20 mg total) by mouth daily. 11/04/16   Laurey MoraleMcLean, Dalton S, MD  sildenafil (REVATIO) 20 MG tablet Take 1 tablet (20 mg total) by mouth as needed. Patient taking differently: Take 20 mg daily as needed by mouth (FOR ED).  12/16/16   Laurey MoraleMcLean, Dalton S, MD  spironolactone (ALDACTONE) 25 MG tablet Take 0.5 tablets (12.5 mg total) by mouth daily. 02/18/17   Duke SalviaKlein, Steven C, MD  triamcinolone cream (KENALOG) 0.1 % USE 1 APPLICATION TOPICALLY TO THE AFFECTED AREA TWICE A DAY AS NEEDED FOR ECZEMA 05/27/16   [provider]   Past Medical History Past Medical History:  Diagnosis Date  . Anxiety   . Automatic implantable cardioverter-defibrillator in situ    a. Medtronic ICD.  [name]. CAD (coronary artery disease)    a.  Severe LAD stenosis 2/2 acute thrombus - BMS 2009;  b. 06/01/11 Cath - LAD 20 isr, 3910m (3.0x16 Veri-flex BMS), OM1 100p, EF 20-25%;  c. 07/2012 Abnl Cardiolite;  d. 08/2012 Cath/PCI: LM nl, LAD patent stents, D2 80-90 (jailed), LCX 90 p/m (4.0x24 Promus Premier DES), OM1 100, OM2 80-90p (3.0x20 Promus Premier DES), RCA patent mid stent, 40d, EF 25%.  e. 10/29/15 LHC stable disease with patent stents  . Chronic systolic CHF (congestive heart failure), NYHA class 3 (HCC) - low output state 12/12/2014  . Depression   . Dyspnea   . History of rheumatic fever 1962  . HLD (hyperlipidemia) mixed  . HTN (hypertension)   . Ischemic cardiomyopathy     a.  EF 30-35% 2010;  b.  EF 20-25% by LV gram 08/2012. c. EF 15% by echo 11/2014.  [name]. Myocardial infarction [facility](HCC) 1998; 2002; ~ 2010  . Paroxysmal ventricular tachycardia (HCC)    a. VFlutter  CL 210 msec  Rx shock 04/2011  . PVC's (premature ventricular contractions)   . RBBB   . TIA (transient ischemic attack)    a. Dx 11/2014, continued on ASA/Plavix.   Past Surgical History: Past Surgical History:  Procedure Laterality Date  . CARDIAC CATHETERIZATION N/A 10/29/2015   Procedure: Right/Left  Heart Cath and Coronary Angiography;  Surgeon: Peter M Swaziland, MD;  Location: [facility] INVASIVE CV LAB;  Service: Cardiovascular;  Laterality: N/A;  . CARDIAC CATHETERIZATION N/A 03/20/2016   Procedure: Right Heart Cath;  Surgeon: Laurey Morale, MD;  Location: [facility] INVASIVE CV LAB;  Service: Cardiovascular;  Laterality: N/A;  . CARDIAC DEFIBRILLATOR PLACEMENT     MDT single chamber primary prevention ICD implanted by Dr Graciela Husbands as part of MASTER study  . EP IMPLANTABLE DEVICE N/A 12/11/2015   Procedure: BiV Upgrade;  Surgeon: [name], MD;  Location[facility] INVASIVE CV LAB;  Service: Cardiovascular;  Laterality: N/A;  . EP IMPLANTABLE DEVICE N/A 12/11/2015   Procedure: Pocket Revision/Relocation;  Surgeon: [name], MD;  Location: [facility] INVASIVE CV LAB;  Service: Cardiovascular;  Laterality: N/A;  . EP IMPLANTABLE DEVICE N/A 12/11/2015   Procedure: Lead Revision/Repair;  Surgeon: [name], MD;  Location: [facility] INVASIVE CV LAB;  Service: Cardiovascular;  Laterality: N/A;  . EP IMPLANTABLE DEVICE N/A 03/23/2016   Procedure: Temporary placement of pacemaker lead and generator;  Surgeon: Marinus Maw, MD;  Location: [facility] OR;  Service: Cardiovascular;  Laterality: N/A;  . EP IMPLANTABLE DEVICE N/A 03/27/2016   Procedure: ICD Implant;  Surgeon: [name], MD;  Location: [facility] INVASIVE CV LAB;  Service: Cardiovascular;  Laterality: N/A;  . GENERATOR REMOVAL N/A 03/23/2016   Procedure: DEVICE REMOVAL;  Surgeon:  Marinus Maw, MD;  Location: [facility] OR;  Service: Cardiovascular;  Laterality: N/A;  PVT to back up  . HERNIA REPAIR     "w/gallbladder OR" (08/29/2012)  . IR GENERIC HISTORICAL  03/31/2016   IR US GUIDE VASC ACCESS LEFT 03/31/2016 Simonne Come, MD [facility]-INTERV RAD  . IR GENERIC HISTORICAL  03/31/2016   IR FLUORO GUIDE CV MIDLINE PICC LEFT 03/31/2016 Simonne Come, MD [facility]INTER[facility]  . LAPAROSCOPIC CHOLECYSTECTOMY    . LEFT HEART CATHETERIZATION WITH CORONARY ANGIOGRAM N/A 06/08/2011   Procedure: LEFT HEART CATHETERIZATION WITH CORONARY ANGIOGRAM;  Surgeon: Dolores Patty, MD;  Location: [facility] CATH LAB;  Service: Cardiovascular;  Laterality: N/A;  . PERCUTANEOUS CORONARY STENT INTERVENTION (PCI-S) N/A 06/01/2011   Procedure: PERCUTANEOUS CORONARY STENT INTERVENTION (PCI-S);  Surgeon: Peter M Swaziland, MD;  Location: [facility] CATH LAB;  Service: Cardiovascular;  Laterality: N/A;  . PERCUTANEOUS CORONARY STENT INTERVENTION (PCI-S) N/A 08/29/2012   Procedure: PERCUTANEOUS CORONARY STENT INTERVENTION (PCI-S);  Surgeon: Peter M Swaziland, MD;  Location: [facility] CATH LAB;  Service: Cardiovascular;  Laterality: N/A;  . PERIPHERAL VASCULAR CATHETERIZATION  12/11/2015   Procedure: Upper Extremity Venography;  Surgeon: [name], MD;  Location: [facility] INVASIVE CV LAB;  Service: Cardiovascular;;  . TEE WITHOUT CARDIOVERSION N/A 03/20/2016   Procedure: TRANSESOPHAGEAL ECHOCARDIOGRAM (TEE);  Surgeon: Vesta Mixer, MD;  Location: [facility] ENDOSCOPY;  Service: Cardiovascular;  Laterality: N/A;  . TEE WITHOUT CARDIOVERSION N/A 03/23/2016   Procedure: TRANSESOPHAGEAL ECHOCARDIOGRAM (TEE);  Surgeon: Marinus Maw, MD;  Location: [facility] OR;  Service: Cardiovascular;  Laterality: N/A;    Family History:  Family History  Problem Relation Age of Onset  . Cancer Father   . Cancer Sister        twin sister and only one has cancer, unknown what kind  . Hypertension Brother   . Cancer Unknown        family hx  . Heart attack Neg Hx   . Stroke Neg Hx      Social History: Social History   Socioeconomic History  . Marital status:  Legally Separated    Spouse name: Not on file  . Number of children: Not on file  . Years of education: Not on file  . Highest education level: Not on file  Social Needs  . Financial resource strain: Not on file  . Food insecurity - worry: Not on file  . Food insecurity - inability: Not on file  . Transportation needs - medical: Not on file  . Transportation needs - non-medical: Not on file  Occupational History  . Not on file  Tobacco Use  . Smoking status: Former Smoker    Packs/day: 1.00    Years: 47.00    Pack years: 47.00    Types: Cigarettes    Last attempt to quit: 11/20/2015    Years since quitting: 1.2  . Smokeless tobacco: Never Used  Substance and Sexual Activity  . Alcohol use: Yes    Alcohol/week: 3.6 oz    Types: 6 Cans of beer per week  . Drug use: No  . Sexual activity: Not Currently  Other Topics Concern  . Not on file  Social History Narrative   Married; full time.     Allergies:  Allergies  Allergen Reactions  . Corlanor [Ivabradine] Diarrhea    Objective:    Vital Signs:   BP 130/77 (BP Location: Left Arm, Patient Position: Sitting, Cuff Size: Normal)   Pulse 93   Wt 224 lb 12.8 oz (102 kg)   SpO2 97%   BMI 31.35 kg/m    (Performed at clinic visit just prior to admit, Hospital vitals not yet obtained)     Wt Readings from Last 3 Encounters:  01/27/2017 224 lb 12.8 oz (102 kg)  02/18/17 220 lb (99.8 kg)  12/31/16 222 lb (100.7 kg)   Physical Exam     General:  NAD  HEENT: Normal Neck: Supple. JVP 12-14 Carotids 2+ bilat; no bruits. No lymphadenopathy or thyromegaly appreciated. Cor: PMI lateral. Regular rate & rhythm. 3/6 HSM apex. 1+ ankle edema.  Lungs: Clear Abdomen: Soft, nontender, nondistended. No hepatosplenomegaly. No bruits or masses. Good bowel sounds. Extremities: No cyanosis, clubbing, rash, edema Neuro: Alert & oriented x 3, cranial nerves  grossly intact. moves all 4 extremities w/o difficulty. Affect pleasant.  Telemetry   Not yet connected  EKG   A sensed V paced, personally reviewed  Labs   Basic Metabolic Panel: No results for input(s): NA, K, CL, CO2, GLUCOSE, BUN, CREATININE, CALCIUM, MG, PHOS in the last 168 hours.  Liver Function Tests: No results for input(s): AST, ALT, ALKPHOS, BILITOT, PROT, ALBUMIN in the last 168 hours. No results for input(s): LIPASE, AMYLASE in the last 168 hours. No results for input(s): AMMONIA in the last 168 hours.  CBC: Recent Labs  Lab 02/04/2017 1328  WBC 7.7  HGB 12.8*  HCT 38.3*  MCV 94.1  PLT 281   Cardiac Enzymes: No results for input(s): CKTOTAL, CKMB, CKMBINDEX, TROPONINI in the last 168 hours.  BNP: BNP (last 3 results) Recent Labs    08/24/16 0916 09/09/16 1104 10/07/16 2103  BNP 911.4* 840.0* 809.6*    ProBNP (last 3 results) No results for input(s): PROBNP in the last 8760 hours.   CBG: No results for input(s): GLUCAP in the last 168 hours.  Coagulation Studies: No results for input(s): LABPROT, INR in the last 72 hours.  Imaging:  No results found.   Patient Profile   [name] is a 61 y.o. 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   Assessment/Plan   1. Acute on chronic systolic CHF: Ischemic cardiomyopathy. With low cardiac output noted on during admission in 10/17. Had upgrade to Medtronic BiV ICD in 10/17. However, with VT storm in 12/17, the LV lead was turned off due to concerns for pro-arrhythmia.  In 1/18, he developed a device infection and the BiV-ICD was removed and later replaced with a Medtronic ICD with His bundle pacing.  RHC (1/18) with preserved cardiac output.  TEE (1/18) with EF 15%.  CPX in 5/18 with  moderate HF limitation.  Most recent echo in 8/18 with EF 20-25%. Most recently, he has lost capture of His bundle lead and AV delay was increased to limit RV pacing.  Today, he is RV paced.  He feels markedly worse, NYHA class IIIb symptoms.  He is volume overloaded on exam and increasing Lasix at home has not appreciably helped.   - He needs to be admitted for IV diuretics, will give Lasix 80 mg IV bid to start with.  - Symptoms markedly worse since loss of His bundle pacing. Will ask EP/Dr. Graciela Husbands to see and discuss options. as we are not going to BiV pace him due to suspected pro-arrhythmia.  ? revise his His bundle lead?  - Did not tolerate Entresto due to orthostasis, has been taken off losartan with elevated creatinine and low BP.  BP not low today, will follow to see if we can restart during this admission.   - Continue spironolactone 12.5 mg daily (did not tolerate increase due to abdominal discomfort).  - Continue digoxin 0.125 mg every other day. Check level on admit.  - He is off bisoprolol, will leave off for now with concern for low output.  - He did not tolerate Corlanor due to diarrhea.  - He will likely need RHC/LHC this admit. Concerned with low output with loss of His bundle pacing.  If we are unable to resynchronize him in the future, this may be problematic.  - He would potentially be an LVAD candidate.   2.  CAD: LHC in 12/17 with stable coronary disease. He has had mild chest discomfort in setting of volume overload.  - Continue ASA, Plavix, statin.  Good lipids 10/18. - Will cycle troponin.  As above, possible RHC/LHC this admission.  3. Smoking:  - Has stopped smoking since 2/18. No change.  4. Rhythm:  - Noted to have type 1 2nd degree AVB and 2:1 AVB in the past. Suspect this contributed to HF with loss of AV synchrony (and also was bradycardic). 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  He was BiV paced after 10/17 admission, but LV lead was turned off due to concern for  pro-arrhythmia.  He now has a Medtronic ICD and had been His bundle pacing, but has lost capture in His lead and is now NSR with RV pacing.  See above discussion, concerned that loss of synchrony is a significant problem for him.  5. Ascending aortic aneurysm:  - CTA chest in 8/18 actually did not show a significant aneurysm.  6. Ventricular tachycardia:  - VT storm in 12/17.   - Continue amiodarone and mexiletine. No VT/VF by device interrogation.  - With amiodarone use, check LFTs and TSH today.  He will need a  regular eye exam. 7. PTSD: Patient is on paroxetine due to significant anxiety around shocks.    Plan to admit due to marked volume overload and potential need for inotrope/pressor support. EP to see and address pacing changes.   Graciella FreerMichael Andrew Andrius Andrepont, PA-C 14-Apr-2016, 2:22 PM  Advanced Heart Failure Team Pager [phone] (M-F; 7a - 4p)  Please contact [facility] for night-coverage after hours (4p -7a ) and weekends on [email or web address]

## 2017-02-23 ENCOUNTER — Other Ambulatory Visit: Payer: Self-pay

## 2017-02-23 ENCOUNTER — Encounter (HOSPITAL_COMMUNITY): Payer: Self-pay | Admitting: *Deleted

## 2017-02-23 ENCOUNTER — Inpatient Hospital Stay (HOSPITAL_COMMUNITY): Payer: Medicaid Other

## 2017-02-23 DIAGNOSIS — I340 Nonrheumatic mitral (valve) insufficiency: Secondary | ICD-10-CM

## 2017-02-23 DIAGNOSIS — I5023 Acute on chronic systolic (congestive) heart failure: Secondary | ICD-10-CM

## 2017-02-23 LAB — PROTIME-INR
INR: 1.06
Prothrombin Time: 13.7 seconds (ref 11.4–15.2)

## 2017-02-23 LAB — BASIC METABOLIC PANEL
ANION GAP: 12 (ref 5–15)
BUN: 24 mg/dL — ABNORMAL HIGH (ref 6–20)
CALCIUM: 8.4 mg/dL — AB (ref 8.9–10.3)
CO2: 26 mmol/L (ref 22–32)
CREATININE: 1.69 mg/dL — AB (ref 0.61–1.24)
Chloride: 97 mmol/L — ABNORMAL LOW (ref 101–111)
GFR, EST AFRICAN AMERICAN: 48 mL/min — AB (ref >60)
GFR, EST NON AFRICAN AMERICAN: 41 mL/min — AB (ref >60)
Glucose, Bld: 92 mg/dL (ref 65–99)
Potassium: 3.3 mmol/L — ABNORMAL LOW (ref 3.5–5.1)
Sodium: 135 mmol/L (ref 135–145)

## 2017-02-23 LAB — TROPONIN I: Troponin I: 0.03 ng/mL (ref <0.03)

## 2017-02-23 LAB — ECHOCARDIOGRAM COMPLETE
HEIGHTINCHES: 71 in
Weight: 3534.4 oz

## 2017-02-23 MED ORDER — DOCUSATE SODIUM 100 MG PO CAPS
100.0000 mg | ORAL_CAPSULE | Freq: Every day | ORAL | Status: DC | PRN
  Administered 2017-02-23 – 2017-02-28 (×3): 100 mg via ORAL
  Filled 2017-02-23 (×3): qty 1

## 2017-02-23 MED ORDER — POTASSIUM CHLORIDE CRYS ER 20 MEQ PO TBCR
40.0000 meq | EXTENDED_RELEASE_TABLET | Freq: Two times a day (BID) | ORAL | Status: DC
  Administered 2017-02-23 – 2017-03-01 (×14): 40 meq via ORAL
  Filled 2017-02-23 (×14): qty 2

## 2017-02-23 MED ORDER — POTASSIUM CHLORIDE CRYS ER 20 MEQ PO TBCR
40.0000 meq | EXTENDED_RELEASE_TABLET | Freq: Once | ORAL | Status: AC
  Administered 2017-02-23: 40 meq via ORAL
  Filled 2017-02-23: qty 2

## 2017-02-23 MED ORDER — FUROSEMIDE 10 MG/ML IJ SOLN
80.0000 mg | Freq: Once | INTRAMUSCULAR | Status: AC
  Administered 2017-02-23: 80 mg via INTRAVENOUS
  Filled 2017-02-23: qty 8

## 2017-02-23 MED ORDER — DIGOXIN 125 MCG PO TABS
125.0000 ug | ORAL_TABLET | ORAL | Status: DC
  Administered 2017-02-25 – 2017-03-07 (×6): 125 ug via ORAL
  Filled 2017-02-23 (×9): qty 1

## 2017-02-23 MED ORDER — PERFLUTREN LIPID MICROSPHERE
1.0000 mL | INTRAVENOUS | Status: AC | PRN
  Administered 2017-02-23: 2 mL via INTRAVENOUS
  Filled 2017-02-23: qty 10

## 2017-02-23 MED ORDER — METOLAZONE 2.5 MG PO TABS
2.5000 mg | ORAL_TABLET | Freq: Once | ORAL | Status: AC
  Administered 2017-02-23: 2.5 mg via ORAL
  Filled 2017-02-23: qty 1

## 2017-02-23 MED ORDER — DIGOXIN 125 MCG PO TABS: 125.0000 ug | ORAL_TABLET | Freq: Every day | ORAL | Status: DC

## 2017-02-23 NOTE — Progress Notes (Signed)
Progress Note  Patient Name: [name] Date of Encounter: 02/23/2017  Primary Cardiologist: Olga Millers, MD   Subjective   Feeling slightly better.  Still short of breath.   Inpatient Medications    Scheduled Meds: . amiodarone  200 mg Oral Daily  . aspirin EC  81 mg Oral Daily  . atorvastatin  40 mg Oral Daily  . clopidogrel  75 mg Oral Daily  . digoxin  125 mcg Oral QODAY  . enoxaparin (LOVENOX) injection  40 mg Subcutaneous Q24H  . famotidine  20 mg Oral BID  . furosemide  80 mg Intravenous BID  . magnesium oxide  400 mg Oral Daily  . mexiletine  200 mg Oral Q12H  . PARoxetine  20 mg Oral Daily  . sodium chloride flush  3 mL Intravenous Q12H  . spironolactone  12.5 mg Oral Daily  . tamsulosin  0.4 mg Oral Daily   Continuous Infusions: . sodium chloride     PRN Meds: sodium chloride, acetaminophen, ALPRAZolam, nitroGLYCERIN, ondansetron (ZOFRAN) IV, sodium chloride flush, triamcinolone cream   Vital Signs    Vitals:   02/19/2017 1609 02/06/2017 1958 02/23/17 0350  BP: 124/84 103/62 (!) 100/57  Pulse: 85 68 78  Resp: 20 20 20   Temp: 98.7 F (37.1 C) 97.9 F (36.6 C) 97.7 F (36.5 C)  TempSrc: Oral Oral Oral  SpO2: 98% 97% 97%  Weight: 220 lb 11.2 oz (100.1 kg)  220 lb 14.4 oz (100.2 kg)  Height: 5\' 11"  (1.803 m)      Intake/Output Summary (Last 24 hours) at 02/23/2017 1040 Last data filed at 02/23/2017 1000 Gross per 24 hour  Intake 1260 ml  Output 1475 ml  Net -215 ml   Filed Weights   01/24/2017 1609 02/23/17 0350  Weight: 220 lb 11.2 oz (100.1 kg) 220 lb 14.4 oz (100.2 kg)    Telemetry    AP VP.  No events.  - Personally Reviewed  ECG    AS VP 87 bpm.  - Personally Reviewed  Physical Exam   VS:  BP (!) 100/57 (BP Location: Left Arm)   Pulse 78   Temp 97.7 F (36.5 C) (Oral)   Resp 20   Ht 5\' 11"  (1.803 m)   Wt 220 lb 14.4 oz (100.2 kg)   SpO2 97%   BMI 30.81 kg/m  , BMI Body mass index is 30.81 kg/m. GENERAL:  Well appearing.  No  acute distress.  HEENT: Pupils equal round and reactive, fundi not visualized, oral mucosa unremarkable NECK:  +JVD, waveform within normal limits, carotid upstroke brisk and symmetric, no bruits, no thyromegaly LYMPHATICS:  No cervical adenopathy LUNGS: Bibasilar crackles.  HEART:  RRR.  PMI not displaced or sustained,S1 and S2 within normal limits, no S3, no S4, no clicks, no rubs, II/VI systolic murmur at LUSB. ABD:  Flat, positive bowel sounds normal in frequency in pitch, no bruits, no rebound, no guarding, no midline pulsatile mass, no hepatomegaly, no splenomegaly EXT:  2 plus pulses throughout, trace edema, no cyanosis no clubbing SKIN:  No rashes no nodules NEURO:  Cranial nerves II through XII grossly intact, motor grossly intact throughout [facility]:  Cognitively intact, oriented to person place and time   Labs    Chemistry Recent Labs  Lab 02/21/2017 1328 01/26/2017 1613 02/23/17 0413  NA 133* 136 135  K 3.6 3.4* 3.3*  CL 97* 96* 97*  CO2 25 28 26   GLUCOSE 95 89 92  BUN 27* 26* 24*  CREATININE 1.84* 1.76* 1.69*  CALCIUM 9.5 8.8* 8.4*  PROT 7.7 7.2  --   ALBUMIN 4.1 3.8  --   AST 38 35  --   ALT 31 29  --   ALKPHOS 61 57  --   BILITOT 1.1 0.9  --   GFRNONAA 37* 39* 41*  GFRAA 43* 46* 48*  ANIONGAP 11 12 12      Hematology Recent Labs  Lab 02/15/2017 1328 02/03/2017 1613  WBC 7.7 6.9  RBC 4.07* 3.85*  HGB 12.8* 11.9*  HCT 38.3* 36.4*  MCV 94.1 94.5  MCH 31.4 30.9  MCHC 33.4 32.7  RDW 14.9 15.0  PLT 281 269    Cardiac Enzymes Recent Labs  Lab 01/25/2017 1328 02/20/2017 1613 02/15/2017 2124 02/23/17 0413  TROPONINI 0.03* <0.03 0.03* 0.03*   No results for input(s): TROPIPOC in the last 168 hours.   BNP Recent Labs  Lab 01/25/2017 1331 02/17/2017 1613  BNP 953.4* 1,016.9*     DDimer No results for input(s): DDIMER in the last 168 hours.   Radiology    Dg Chest 2 View  Result Date: 02/23/2017 CLINICAL DATA:  CHF EXAM: CHEST  2 VIEW COMPARISON:  10/07/2016  chest radiograph. FINDINGS: Stable configuration of 3 lead right subclavian ICD. Stable cardiomediastinal silhouette with borderline mild cardiomegaly. No pneumothorax. No pleural effusion. Borderline mild pulmonary edema. IMPRESSION: Borderline mild congestive heart failure. Electronically Signed   By: Delbert Phenix M.D.   On: 02/23/2017 08:05    Cardiac Studies   Echo 10/12/16: Study Conclusions  - Left ventricle: The cavity size was severely dilated. Wall   thickness was increased in a pattern of moderate LVH. Systolic   function was severely reduced. The estimated ejection fraction   was in the range of 20% to 25%. Features are consistent with a   pseudonormal left ventricular filling pattern, with concomitant   abnormal relaxation and increased filling pressure (grade 2   diastolic dysfunction). - Aortic valve: There was moderate regurgitation. - Mitral valve: Mildly calcified annulus. There was moderate   regurgitation. - Left atrium: The atrium was severely dilated. - Right atrium: The atrium was moderately dilated. - Pulmonary arteries: Systolic pressure was moderately increased.   PA peak pressure: 49 mm Hg (S).  Patient Profile     62 y.o. male with chronic systolic and diastolic heart failure, CAD s/p multiple PCI and VT here with acute on chronic systolic and diastolic heart failure.  Assessment & Plan    # Acute on chronic systolic and diastolic heart failure:  BNP >1000 on admission.  Echo pending. [name] was net -1L after one dose of lasix.  He is just now getting his second dose. Continue diuresis.  There is concern that his worsened HF is from RV pacing as his His lead is not functioning properly.  EP to see tomorrow to consider revising His lead.  If this is not an option Advanced Heart Failure may consider LVAD.  Digoxin was subtherapeutic.  Will increase dose to daily.  Continue spironolactone. He didn't tolerate Entresto.  No beta blocker 2/2 prior bradycardia and  2nd degree heart block.  Bisoprolol on hold also 2/2 acute heart failure.   # CAD s/p multiple PCI: Not an active issue.  Troponin mildly elevated 2/2 demand ischemia.  Continue aspirin, clopidogrel, and atorvastatin.  # VT: [name] had VT storm 01/2016.  Stable on amiodarone and mexilitine.   For questions or updates, please contact [facility] Please consult [email or web address]  for contact info under Cardiology/STEMI.      Signed, Chilton Siiffany Finley, MD  02/23/2017, 10:40 AM

## 2017-02-23 NOTE — Progress Notes (Signed)
Pt. Troponin 0.03. On call for Cardiology paged to make aware.

## 2017-02-23 NOTE — Progress Notes (Signed)
*  PRELIMINARY RESULTS* Echocardiogram 2D Echocardiogram with definity has been performed.  Jeryl Columbialliott, Ayomide Purdy 02/23/2017, 3:51 PM

## 2017-02-23 NOTE — Plan of Care (Signed)
Pt. States he is diuresing more and was able to have a bowel movement today.

## 2017-02-23 NOTE — Progress Notes (Signed)
Patient ID: [name], male   DOB: 08/22/1955, 62 y.o.   MRN: [id number]     Advanced Heart Failure Rounding Note  HF Cardiology: [name]   Subjective:    Still feeling short of breath.  Not much response to IV Lasix.   Objective:   Weight Range: 220 lb 14.4 oz (100.2 kg) Body mass index is 30.81 kg/m.   Vital Signs:   Temp:  [97.7 F (36.5 C)-98.7 F (37.1 C)] 97.7 F (36.5 C) (01/01 0350) Pulse Rate:  [68-93] 78 (01/01 0350) Resp:  [20] 20 (01/01 0350) BP: (100-130)/(57-84) 100/57 (01/01 0350) SpO2:  [97 %-98 %] 97 % (01/01 0350) Weight:  [220 lb 11.2 oz (100.1 kg)-224 lb 12.8 oz (102 kg)] 220 lb 14.4 oz (100.2 kg) (01/01 0350) Last BM Date: 03/11/16  Weight change: Filed Weights   03/11/16 1609 02/23/17 0350  Weight: 220 lb 11.2 oz (100.1 kg) 220 lb 14.4 oz (100.2 kg)    Intake/Output:   Intake/Output Summary (Last 24 hours) at 02/23/2017 1127 Last data filed at 02/23/2017 1000 Gross per 24 hour  Intake 1260 ml  Output 1475 ml  Net -215 ml      Physical Exam    General: NAD Neck: JVP 10-12 cm, no thyromegaly or thyroid nodule.  Lungs: Clear to auscultation bilaterally with normal respiratory effort. CV: Lateral PMI.  Heart regular S1/S2, no S3/S4, 2/6 HSM apex.  No peripheral edema.  No carotid bruit.  Normal pedal pulses.  Abdomen: Soft, nontender, no hepatosplenomegaly, mild distention.  Skin: Intact without lesions or rashes.  Neurologic: Alert and oriented x 3.  Psych: Normal affect. Extremities: No clubbing or cyanosis.  HEENT: Normal.   Telemetry   NSR with RV pacing  Labs    CBC Recent Labs    03/11/16 1328 03/11/16 1613  WBC 7.7 6.9  HGB 12.8* 11.9*  HCT 38.3* 36.4*  MCV 94.1 94.5  PLT 281 269   Basic Metabolic Panel Recent Labs    40/98/1101/17/18 1613 02/23/17 0413  NA 136 135  K 3.4* 3.3*  CL 96* 97*  CO2 28 26  GLUCOSE 89 92  BUN 26* 24*  CREATININE 1.76* 1.69*  CALCIUM 8.8* 8.4*  MG 2.2  --    Liver Function Tests Recent  Labs    03/11/16 1328 03/11/16 1613  AST 38 35  ALT 31 29  ALKPHOS 61 57  BILITOT 1.1 0.9  PROT 7.7 7.2  ALBUMIN 4.1 3.8   No results for input(s): LIPASE, AMYLASE in the last 72 hours. Cardiac Enzymes Recent Labs    03/11/16 1613 03/11/16 2124 02/23/17 0413  TROPONINI <0.03 0.03* 0.03*    BNP: BNP (last 3 results) Recent Labs    10/07/16 2103 03/11/16 1331 03/11/16 1613  BNP 809.6* 953.4* 1,016.9*    ProBNP (last 3 results) No results for input(s): PROBNP in the last 8760 hours.   D-Dimer No results for input(s): DDIMER in the last 72 hours. Hemoglobin A1C No results for input(s): HGBA1C in the last 72 hours. Fasting Lipid Panel No results for input(s): CHOL, HDL, LDLCALC, TRIG, CHOLHDL, LDLDIRECT in the last 72 hours. Thyroid Function Tests Recent Labs    03/11/16 1613  TSH 0.629    Other results:   Imaging    Dg Chest 2 View  Result Date: 02/23/2017 CLINICAL DATA:  CHF EXAM: CHEST  2 VIEW COMPARISON:  10/07/2016 chest radiograph. FINDINGS: Stable configuration of 3 lead right subclavian ICD. Stable cardiomediastinal silhouette with borderline mild  cardiomegaly. No pneumothorax. No pleural effusion. Borderline mild pulmonary edema. IMPRESSION: Borderline mild congestive heart failure. Electronically Signed   By: Delbert Phenix M.D.   On: 02/23/2017 08:05      Medications:     Scheduled Medications: . amiodarone  200 mg Oral Daily  . aspirin EC  81 mg Oral Daily  . atorvastatin  40 mg Oral Daily  . clopidogrel  75 mg Oral Daily  . [START ON 02/25/2017] digoxin  125 mcg Oral QODAY  . enoxaparin (LOVENOX) injection  40 mg Subcutaneous Q24H  . famotidine  20 mg Oral BID  . furosemide  80 mg Intravenous BID  . magnesium oxide  400 mg Oral Daily  . metolazone  2.5 mg Oral Once  . mexiletine  200 mg Oral Q12H  . PARoxetine  20 mg Oral Daily  . potassium chloride  40 mEq Oral BID  . potassium chloride  40 mEq Oral Once  . sodium chloride flush  3 mL  Intravenous Q12H  . spironolactone  12.5 mg Oral Daily  . tamsulosin  0.4 mg Oral Daily     Infusions: . sodium chloride       PRN Medications:  sodium chloride, acetaminophen, ALPRAZolam, nitroGLYCERIN, ondansetron (ZOFRAN) IV, sodium chloride flush, triamcinolone cream   Assessment/Plan   1. Acute on chronic systolic CHF: Ischemic cardiomyopathy. With low cardiac output noted on during admission in 10/17. Had upgrade to Medtronic BiV ICD in 10/17. However, with VT storm in 12/17, the LV lead was turned off due to concerns for pro-arrhythmia.  In 1/18, he developed a device infection and the BiV-ICD was removed and later replaced with a Medtronic ICD with His bundle pacing.  RHC (1/18) with preserved cardiac output.  TEE (1/18) with EF 15%.  CPX in 5/18 with moderate HF limitation.  Most recent echo in 8/18 with EF 20-25%. Most recently, he has lost capture of His bundle lead and AV delay was increased to limit RV pacing.  Today, he is RV paced.  He has been feeling markedly worse, NYHA class IIIb symptoms.  He is volume overloaded on exam and was admitted for diuresis.  So far, he has not had much response to IV Lasix. Creatinine stable at 1.69.  - Will give Lasix 80 mg IV bid today with 1 dose of metolazone 2.5 x 1 and K replacement.  - Symptoms markedly worse since lost of His bundle pacing, have discussed options with Dr. Graciela Husbands as we are not going to BiV pace him due to suspected pro-arrhythmia.  Can we revise his His bundle lead?  EP will see him.  - Did not tolerate Entresto due to orthostasis, has been taken off losartan with elevated creatinine and low BP.    - Continue spironolactone 12.5 mg daily (did not tolerate increase due to abdominal discomfort).  - Continue digoxin 0.125 mg every other day. Level ok.   - He is off bisoprolol, will leave off for now with concern for low output.  - He did not tolerate Corlanor due to diarrhea.  - He will need RHC/LHC as I am concerned for  low output with loss of His bundle pacing.  If we are unable to resynchronize him in the future, this may be problematic.  He would be an LVAD candidate.  We discussed risks/benefits of procedure today and he agrees to cath tomorrow.  2.  CAD: LHC in 12/17 with stable coronary disease. He has had mild chest discomfort in setting of volume  overload.  Slight troponin elevation, most likely demand ischemia with volume overload.  - Continue ASA, Plavix, statin.  Good lipids 10/18. -  As above, plan RHC/LHC this admission given markedly worse symptoms.  3. Smoking: has stopped smoking since 2/18.  4. 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 Suspect this contributed to HF with loss of AV synchrony (and also was bradycardic). 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  He was BiV paced after 10/17 admission, but LV lead was turned off due to concern for pro-arrhythmia.  He now has a Medtronic ICD and had been His bundle pacing, but has lost capture in His lead and is now NSR with RV pacing.  See above discussion, concerned that loss of synchrony is a significant problem for him.  5. Ascending aortic aneurysm: CTA chest in 8/18 actually did not show a significant aneurysm.  6. Ventricular tachycardia: VT storm in 12/17.   - Continue amiodarone and mexiletine. No VT/VF by device interrogation.  - Recent LFTs and TSH ok with amiodarone use.   7. PTSD: Patient is on paroxetine due to significant anxiety around shocks.  Will give low dose Valium prior to cath.   Length of Stay: 1  Marca Ancona, MD  02/23/2017, 11:27 AM  Advanced Heart Failure Team Pager [phone] (M-F; 7a - 4p)  Please contact [facility] for night-coverage after hours (4p -7a ) and weekends on [email or web address]

## 2017-02-23 DEATH — deceased

## 2017-02-24 ENCOUNTER — Other Ambulatory Visit (HOSPITAL_COMMUNITY): Payer: Self-pay | Admitting: *Deleted

## 2017-02-24 ENCOUNTER — Encounter (HOSPITAL_COMMUNITY): Payer: Self-pay | Admitting: Cardiology

## 2017-02-24 ENCOUNTER — Inpatient Hospital Stay (HOSPITAL_COMMUNITY): Payer: Medicaid Other

## 2017-02-24 ENCOUNTER — Encounter (HOSPITAL_COMMUNITY): Admission: AD | Disposition: E | Payer: Self-pay | Source: Ambulatory Visit | Attending: Cardiology

## 2017-02-24 DIAGNOSIS — I509 Heart failure, unspecified: Secondary | ICD-10-CM

## 2017-02-24 DIAGNOSIS — Z4502 Encounter for adjustment and management of automatic implantable cardiac defibrillator: Secondary | ICD-10-CM

## 2017-02-24 HISTORY — PX: RIGHT HEART CATH: CATH118263

## 2017-02-24 LAB — CBC WITH DIFFERENTIAL/PLATELET
Basophils Absolute: 0 10*3/uL (ref 0.0–0.1)
Basophils Relative: 0 %
EOS ABS: 0.3 10*3/uL (ref 0.0–0.7)
Eosinophils Relative: 4 %
HEMATOCRIT: 35.9 % — AB (ref 39.0–52.0)
HEMOGLOBIN: 11.6 g/dL — AB (ref 13.0–17.0)
LYMPHS ABS: 1.5 10*3/uL (ref 0.7–4.0)
Lymphocytes Relative: 19 %
MCH: 30.6 pg (ref 26.0–34.0)
MCHC: 32.3 g/dL (ref 30.0–36.0)
MCV: 94.7 fL (ref 78.0–100.0)
MONOS PCT: 6 %
Monocytes Absolute: 0.5 10*3/uL (ref 0.1–1.0)
NEUTROS ABS: 5.5 10*3/uL (ref 1.7–7.7)
NEUTROS PCT: 71 %
Platelets: 267 10*3/uL (ref 150–400)
RBC: 3.79 MIL/uL — ABNORMAL LOW (ref 4.22–5.81)
RDW: 14.8 % (ref 11.5–15.5)
WBC: 7.8 10*3/uL (ref 4.0–10.5)

## 2017-02-24 LAB — POCT I-STAT 3, VENOUS BLOOD GAS (G3P V)
BICARBONATE: 25.3 mmol/L (ref 20.0–28.0)
BICARBONATE: 25.5 mmol/L (ref 20.0–28.0)
BICARBONATE: 25.6 mmol/L (ref 20.0–28.0)
O2 SAT: 51 %
O2 Saturation: 50 %
O2 Saturation: 54 %
PCO2 VEN: 42 mmHg — AB (ref 44.0–60.0)
PCO2 VEN: 43 mmHg — AB (ref 44.0–60.0)
PH VEN: 7.388 (ref 7.250–7.430)
PO2 VEN: 27 mmHg — AB (ref 32.0–45.0)
PO2 VEN: 28 mmHg — AB (ref 32.0–45.0)
PO2 VEN: 29 mmHg — AB (ref 32.0–45.0)
TCO2: 27 mmol/L (ref 22–32)
TCO2: 27 mmol/L (ref 22–32)
TCO2: 27 mmol/L (ref 22–32)
pCO2, Ven: 42.6 mmHg — ABNORMAL LOW (ref 44.0–60.0)
pH, Ven: 7.383 (ref 7.250–7.430)
pH, Ven: 7.385 (ref 7.250–7.430)

## 2017-02-24 LAB — HEPARIN LEVEL (UNFRACTIONATED): Heparin Unfractionated: 0.39 IU/mL (ref 0.30–0.70)

## 2017-02-24 LAB — BASIC METABOLIC PANEL
Anion gap: 10 (ref 5–15)
BUN: 27 mg/dL — ABNORMAL HIGH (ref 6–20)
CHLORIDE: 98 mmol/L — AB (ref 101–111)
CO2: 27 mmol/L (ref 22–32)
CREATININE: 1.99 mg/dL — AB (ref 0.61–1.24)
Calcium: 9.4 mg/dL (ref 8.9–10.3)
GFR calc non Af Amer: 34 mL/min — ABNORMAL LOW (ref >60)
GFR, EST AFRICAN AMERICAN: 39 mL/min — AB (ref >60)
Glucose, Bld: 98 mg/dL (ref 65–99)
Potassium: 4.1 mmol/L (ref 3.5–5.1)
Sodium: 135 mmol/L (ref 135–145)

## 2017-02-24 SURGERY — RIGHT HEART CATH
Anesthesia: LOCAL

## 2017-02-24 MED ORDER — MILRINONE LACTATE IN DEXTROSE 20-5 MG/100ML-% IV SOLN
0.2500 ug/kg/min | INTRAVENOUS | Status: DC
  Administered 2017-02-24 – 2017-02-26 (×6): 0.25 ug/kg/min via INTRAVENOUS
  Filled 2017-02-24 (×2): qty 100
  Filled 2017-02-24: qty 200
  Filled 2017-02-24 (×4): qty 100

## 2017-02-24 MED ORDER — SODIUM CHLORIDE 0.9 % IV SOLN: INTRAVENOUS | Status: DC

## 2017-02-24 MED ORDER — FUROSEMIDE 10 MG/ML IJ SOLN
INTRAMUSCULAR | Status: AC
  Filled 2017-02-24: qty 8

## 2017-02-24 MED ORDER — FENTANYL CITRATE (PF) 100 MCG/2ML IJ SOLN
INTRAMUSCULAR | Status: DC | PRN
  Administered 2017-02-24 (×3): 25 ug via INTRAVENOUS

## 2017-02-24 MED ORDER — ALPRAZOLAM 0.5 MG PO TABS
0.5000 mg | ORAL_TABLET | Freq: Two times a day (BID) | ORAL | Status: DC | PRN
  Administered 2017-02-24 – 2017-03-07 (×13): 0.5 mg via ORAL
  Filled 2017-02-24 (×12): qty 1

## 2017-02-24 MED ORDER — LIDOCAINE HCL (PF) 1 % IJ SOLN
INTRAMUSCULAR | Status: DC | PRN
  Administered 2017-02-24: 2 mL via SUBCUTANEOUS

## 2017-02-24 MED ORDER — SODIUM CHLORIDE 0.9 % IV SOLN
INTRAVENOUS | Status: AC | PRN
  Administered 2017-02-24: 10 mL/h via INTRAVENOUS

## 2017-02-24 MED ORDER — LIDOCAINE HCL (PF) 1 % IJ SOLN
INTRAMUSCULAR | Status: AC
  Filled 2017-02-24: qty 30

## 2017-02-24 MED ORDER — HEPARIN (PORCINE) IN NACL 100-0.45 UNIT/ML-% IJ SOLN
1550.0000 [IU]/h | INTRAMUSCULAR | Status: DC
  Administered 2017-02-24 – 2017-03-02 (×6): 1550 [IU]/h via INTRAVENOUS
  Filled 2017-02-24 (×10): qty 250

## 2017-02-24 MED ORDER — SODIUM CHLORIDE 0.9% FLUSH: 3.0000 mL | INTRAVENOUS | Status: DC | PRN

## 2017-02-24 MED ORDER — MIDAZOLAM HCL 2 MG/2ML IJ SOLN
INTRAMUSCULAR | Status: AC
  Filled 2017-02-24: qty 2

## 2017-02-24 MED ORDER — DIAZEPAM 2 MG PO TABS
2.0000 mg | ORAL_TABLET | Freq: Once | ORAL | Status: AC
  Administered 2017-02-24: 2 mg via ORAL
  Filled 2017-02-24: qty 1

## 2017-02-24 MED ORDER — VERAPAMIL HCL 2.5 MG/ML IV SOLN
INTRAVENOUS | Status: AC
  Filled 2017-02-24: qty 2

## 2017-02-24 MED ORDER — FENTANYL CITRATE (PF) 100 MCG/2ML IJ SOLN
INTRAMUSCULAR | Status: AC
  Filled 2017-02-24: qty 2

## 2017-02-24 MED ORDER — SODIUM CHLORIDE 0.9% FLUSH
3.0000 mL | Freq: Two times a day (BID) | INTRAVENOUS | Status: DC
  Administered 2017-02-24 (×2): 3 mL via INTRAVENOUS

## 2017-02-24 MED ORDER — FUROSEMIDE 10 MG/ML IJ SOLN
80.0000 mg | Freq: Three times a day (TID) | INTRAMUSCULAR | Status: DC
  Administered 2017-02-24 – 2017-02-25 (×3): 80 mg via INTRAVENOUS
  Filled 2017-02-24 (×2): qty 8

## 2017-02-24 MED ORDER — HEPARIN (PORCINE) IN NACL 2-0.9 UNIT/ML-% IJ SOLN
INTRAMUSCULAR | Status: AC
  Filled 2017-02-24: qty 1000

## 2017-02-24 MED ORDER — SODIUM CHLORIDE 0.9 % IV SOLN: 250.0000 mL | INTRAVENOUS | Status: DC | PRN

## 2017-02-24 MED ORDER — AMIODARONE HCL 200 MG PO TABS
200.0000 mg | ORAL_TABLET | Freq: Two times a day (BID) | ORAL | Status: DC
  Administered 2017-02-24 – 2017-03-05 (×18): 200 mg via ORAL
  Filled 2017-02-24 (×18): qty 1

## 2017-02-24 MED ORDER — MIDAZOLAM HCL 2 MG/2ML IJ SOLN
INTRAMUSCULAR | Status: DC | PRN
  Administered 2017-02-24: 1 mg via INTRAVENOUS

## 2017-02-24 MED ORDER — ASPIRIN 81 MG PO CHEW
81.0000 mg | CHEWABLE_TABLET | ORAL | Status: AC
  Administered 2017-02-24: 81 mg via ORAL
  Filled 2017-02-24: qty 1

## 2017-02-24 MED ORDER — HEPARIN (PORCINE) IN NACL 2-0.9 UNIT/ML-% IJ SOLN
INTRAMUSCULAR | Status: AC | PRN
  Administered 2017-02-24: 1000 mL

## 2017-02-24 MED ORDER — IOPAMIDOL (ISOVUE-370) INJECTION 76%
INTRAVENOUS | Status: AC
  Filled 2017-02-24: qty 100

## 2017-02-24 SURGICAL SUPPLY — 10 items
CATH BALLN WEDGE 5F 110CM (CATHETERS) ×1 IMPLANT
GLIDESHEATH SLEND SS 6F .021 (SHEATH) IMPLANT
GUIDEWIRE .025 260CM (WIRE) ×1 IMPLANT
GUIDEWIRE INQWIRE 1.5J.035X260 (WIRE) IMPLANT
INQWIRE 1.5J .035X260CM (WIRE)
KIT HEART LEFT (KITS) ×1 IMPLANT
PACK CARDIAC CATHETERIZATION (CUSTOM PROCEDURE TRAY) ×2 IMPLANT
SHEATH GLIDE SLENDER 4/5FR (SHEATH) ×1 IMPLANT
TRANSDUCER W/STOPCOCK (MISCELLANEOUS) ×2 IMPLANT
TUBING CIL FLEX 10 FLL-RA (TUBING) ×1 IMPLANT

## 2017-02-24 NOTE — Plan of Care (Signed)
Pt. Is coping well with care provided.

## 2017-02-24 NOTE — Progress Notes (Signed)
I stopped in to check on [name].  He is known to me from previous hospitalizations and clinic visits.  We briefly reviewed HF recommendations for home.  He says he "could teach the recommendations to others" and that he "tries to follow as close as possible".  The nurse reports that he is quite anxious and has requested medications and she has given.  I offered encouragement to he and his family.

## 2017-02-24 NOTE — Progress Notes (Signed)
ANTICOAGULATION CONSULT NOTE - Follow Up Consult  Pharmacy Consult for heparin Indication: LV thrombus  Labs: Recent Labs    01/27/2017 1328 02/07/2017 1613 01/27/2017 2124 02/23/17 0413 02/23/17 1229 03/18/2017 0742 03/18/2017 2235  HGB 12.8* 11.9*  --   --   --  11.6*  --   HCT 38.3* 36.4*  --   --   --  35.9*  --   PLT 281 269  --   --   --  267  --   LABPROT  --   --   --   --  13.7  --   --   INR  --   --   --   --  1.06  --   --   HEPARINUNFRC  --   --   --   --   --   --  0.39  CREATININE 1.84* 1.76*  --  1.69*  --  1.99*  --   TROPONINI 0.03* <0.03 0.03* 0.03*  --   --   --     Assessment/Plan:  62yo male therapeutic on heparin with initial dosing for suspected LV thrombus. Will continue gtt at current rate and confirm stable with am labs.   Vernard GamblesVeronda Susana Duell, PharmD, BCPS  03/03/2017,11:30 PM

## 2017-02-24 NOTE — Progress Notes (Signed)
Discussed patient with Dr Graciela HusbandsKlein His Bundle capture today 6V@1 .5msec Will reprogram LV lead (His Lead) on at max output and see if he improves Dr Graciela HusbandsKlein to see in the morning for further recommendations regarding lead revision  Gypsy BalsamAmber Pleshette Tomasini, NP 02/23/2017 9:12 AM

## 2017-02-24 NOTE — Progress Notes (Signed)
ANTICOAGULATION CONSULT NOTE - Initial Consult  Pharmacy Consult for heparin Indication: LV thrombus  Allergies  Allergen Reactions  . Corlanor [Ivabradine] Diarrhea    Patient Measurements: Height: 5\' 11"  (180.3 cm) Weight: 221 lb 6.4 oz (100.4 kg) IBW/kg (Calculated) : 75.3 Heparin Dosing Weight: 95.9 kg   Vital Signs: Temp: 97.7 F (36.5 C) (01/02 1200) Temp Source: Oral (01/02 1200) BP: 119/75 (01/02 1339) Pulse Rate: 74 (01/02 1339)  Labs: Recent Labs    01/29/2017 1328 02/13/2017 1613 02/02/2017 2124 02/23/17 0413 02/23/17 1229 03/24/2017 0742  HGB 12.8* 11.9*  --   --   --  11.6*  HCT 38.3* 36.4*  --   --   --  35.9*  PLT 281 269  --   --   --  267  LABPROT  --   --   --   --  13.7  --   INR  --   --   --   --  1.06  --   CREATININE 1.84* 1.76*  --  1.69*  --  1.99*  TROPONINI 0.03* <0.03 0.03* 0.03*  --   --     Estimated Creatinine Clearance: 45.8 mL/min (A) (by C-G formula based on SCr of 1.99 mg/dL (H)).   Medical History: Past Medical History:  Diagnosis Date  . Anxiety   . Automatic implantable cardioverter-defibrillator in situ    a. Medtronic ICD.  [name]. CAD (coronary artery disease)    a.  Severe LAD stenosis 2/2 acute thrombus - BMS 2009;  b. 06/01/11 Cath - LAD 20 isr, 7619m (3.0x16 Veri-flex BMS), OM1 100p, EF 20-25%;  c. 07/2012 Abnl Cardiolite;  d. 08/2012 Cath/PCI: LM nl, LAD patent stents, D2 80-90 (jailed), LCX 90 p/m (4.0x24 Promus Premier DES), OM1 100, OM2 80-90p (3.0x20 Promus Premier DES), RCA patent mid stent, 40d, EF 25%.  e. 10/29/15 LHC stable disease with patent stents  . Chronic systolic CHF (congestive heart failure), NYHA class 3 (HCC) - low output state 12/12/2014  . Depression   . Dyspnea   . History of rheumatic fever 1962  . HLD (hyperlipidemia) mixed  . HTN (hypertension)   . Ischemic cardiomyopathy    a.  EF 30-35% 2010;  b.  EF 20-25% by LV gram 08/2012. c. EF 15% by echo 11/2014.  [name]. Myocardial infarction [facility](HCC) 1998; 2002; ~ 2010  .  Paroxysmal ventricular tachycardia (HCC)    a. VFlutter  CL 210 msec  Rx shock 04/2011  . PVC's (premature ventricular contractions)   . RBBB   . TIA (transient ischemic attack)    a. Dx 11/2014, continued on ASA/Plavix.    Medications:  Scheduled:  . amiodarone  200 mg Oral BID  . [MAR Hold] aspirin EC  81 mg Oral Daily  . [MAR Hold] atorvastatin  40 mg Oral Daily  . [MAR Hold] clopidogrel  75 mg Oral Daily  . [MAR Hold] digoxin  125 mcg Oral QODAY  . [MAR Hold] famotidine  20 mg Oral BID  . furosemide  80 mg Intravenous Q8H  . [MAR Hold] magnesium oxide  400 mg Oral Daily  . [MAR Hold] mexiletine  200 mg Oral Q12H  . [MAR Hold] PARoxetine  20 mg Oral Daily  . [MAR Hold] potassium chloride  40 mEq Oral BID  . [MAR Hold] sodium chloride flush  3 mL Intravenous Q12H  . sodium chloride flush  3 mL Intravenous Q12H  . [MAR Hold] spironolactone  12.5 mg Oral Daily  . [facility][MAR  Hold] tamsulosin  0.4 mg Oral Daily    Assessment: 61 yom admitted for increased SOB, abdominal swelling, and orthopnea with known history of CHF. Patient went for RHC today and found to have suspected LV thrombus. Pharmacy consulted to start heparin 2 hours after sheath removal (charted removed on 1/2 at 1340). Patient was not on anticoagulation prior to admission.   Hgb is 11.6 today. Platelets were within normal limits. Received enoxaparin 40 mg yesterday on 1/1 ~0900. Renal function is increased today (Scr 1.99; normCrCl ~38 mL/min).   Goal of Therapy:  Heparin level 0.3-0.7 units/ml Monitor platelets by anticoagulation protocol: Yes   Plan:  No bolus Start heparin infusion at 1550 units/hr Check anti-Xa level in 6 hours and daily while on heparin Continue to monitor H&H and platelets  Girard Cooter, PharmD Clinical Pharmacist  Pager: [phone] Clinical Phone for 02/25/2016 until 3:30pm: [phone] If after 3:30pm, please call main pharmacy at [phone]/03/22,2:06 PM

## 2017-02-24 NOTE — Progress Notes (Signed)
Called IR regarding PICC placement, [facility] who asked me to speak to IV team first, which I have already done.   Per IV team they cannot place PICC for PA.   IR said they will try to get to pt's case today.

## 2017-02-24 NOTE — Progress Notes (Signed)
Pt. Given PRN daily Xanax prior to scheduled cath today. Pt. Requesting now to help with rest. On call MD for Cardiology paged to make aware.

## 2017-02-24 NOTE — Progress Notes (Signed)
Patient ID: [name], male   DOB: 10-24-1955, 62 y.o.   MRN: [id number]     Advanced Heart Failure Rounding Note  HF Cardiology: Shirlee Latch   Subjective:    Breathing slightly improved. Still having only modest UOP. Denies SOB getting around the room, which is an improvement from PTA.   Negative 350 cc, but up 1 lb.   Creatinine 1.69 yesterday. BMET pending today. (Was ordered, not done)  Objective:   Weight Range: 221 lb 6.4 oz (100.4 kg) Body mass index is 30.88 kg/m.   Vital Signs:   Temp:  [98.1 F (36.7 C)-98.3 F (36.8 C)] 98.3 F (36.8 C) (01/02 0449) Pulse Rate:  [78-81] 78 (01/02 0449) Resp:  [18] 18 (01/02 0449) BP: (101-104)/(59-65) 104/62 (01/02 0449) SpO2:  [97 %-99 %] 99 % (01/02 0449) Weight:  [221 lb 6.4 oz (100.4 kg)] 221 lb 6.4 oz (100.4 kg) (01/02 0449) Last BM Date: 02/23/17  Weight change: Filed Weights   01/31/2017 1609 02/23/17 0350 03/10/2017 0449  Weight: 220 lb 11.2 oz (100.1 kg) 220 lb 14.4 oz (100.2 kg) 221 lb 6.4 oz (100.4 kg)    Intake/Output:   Intake/Output Summary (Last 24 hours) at 02/23/2017 0757 Last data filed at 03/04/2017 0547 Gross per 24 hour  Intake 2220 ml  Output 2575 ml  Net -355 ml      Physical Exam    General: NAD HEENT: Normal Neck: Supple. JVP at least 10 cm. Carotids 2+ bilat; no bruits. No thyromegaly or nodule noted. Cor: PMI nondisplaced. RRR, 2/6 HSM apex.  Lungs: CTAB, normal effort. Abdomen: Soft, non-tender, non-distended, no HSM. No bruits or masses. +BS  Extremities: No cyanosis, clubbing, or rash. R and LLE no edema.  Neuro: Alert & orientedx3, cranial nerves grossly intact. moves all 4 extremities w/o difficulty. Affect pleasant   Telemetry   NSR with RV pacing, personally reviewed.  Labs    CBC Recent Labs    02/11/2017 1328 01/31/2017 1613  WBC 7.7 6.9  HGB 12.8* 11.9*  HCT 38.3* 36.4*  MCV 94.1 94.5  PLT 281 269   Basic Metabolic Panel Recent Labs    40/98/11 1613 02/23/17 0413  NA  136 135  K 3.4* 3.3*  CL 96* 97*  CO2 28 26  GLUCOSE 89 92  BUN 26* 24*  CREATININE 1.76* 1.69*  CALCIUM 8.8* 8.4*  MG 2.2  --    Liver Function Tests Recent Labs    02/08/2017 1328 02/03/2017 1613  AST 38 35  ALT 31 29  ALKPHOS 61 57  BILITOT 1.1 0.9  PROT 7.7 7.2  ALBUMIN 4.1 3.8   No results for input(s): LIPASE, AMYLASE in the last 72 hours. Cardiac Enzymes Recent Labs    02/13/2017 1613 01/27/2017 2124 02/23/17 0413  TROPONINI <0.03 0.03* 0.03*    BNP: BNP (last 3 results) Recent Labs    10/07/16 2103 02/02/2017 1331 02/19/2017 1613  BNP 809.6* 953.4* 1,016.9*    ProBNP (last 3 results) No results for input(s): PROBNP in the last 8760 hours.   D-Dimer No results for input(s): DDIMER in the last 72 hours. Hemoglobin A1C No results for input(s): HGBA1C in the last 72 hours. Fasting Lipid Panel No results for input(s): CHOL, HDL, LDLCALC, TRIG, CHOLHDL, LDLDIRECT in the last 72 hours. Thyroid Function Tests Recent Labs    02/20/2017 1613  TSH 0.629    Other results:   Imaging   No results found.  Medications:     Scheduled Medications: .  amiodarone  200 mg Oral Daily  . aspirin EC  81 mg Oral Daily  . atorvastatin  40 mg Oral Daily  . clopidogrel  75 mg Oral Daily  . diazepam  2 mg Oral Once  . [START ON 02/25/2017] digoxin  125 mcg Oral QODAY  . enoxaparin (LOVENOX) injection  40 mg Subcutaneous Q24H  . famotidine  20 mg Oral BID  . magnesium oxide  400 mg Oral Daily  . mexiletine  200 mg Oral Q12H  . PARoxetine  20 mg Oral Daily  . potassium chloride  40 mEq Oral BID  . sodium chloride flush  3 mL Intravenous Q12H  . sodium chloride flush  3 mL Intravenous Q12H  . spironolactone  12.5 mg Oral Daily  . tamsulosin  0.4 mg Oral Daily    Infusions: . sodium chloride    . sodium chloride    . sodium chloride      PRN Medications: sodium chloride, sodium chloride, acetaminophen, ALPRAZolam, docusate sodium, nitroGLYCERIN, ondansetron  (ZOFRAN) IV, sodium chloride flush, sodium chloride flush, triamcinolone cream   Assessment/Plan   1. Acute on chronic systolic CHF: Ischemic cardiomyopathy. With low cardiac output noted on during admission in 10/17. Had upgrade to Medtronic BiV ICD in 10/17. However, with VT storm in 12/17, the LV lead was turned off due to concerns for pro-arrhythmia.  In 1/18, he developed a device infection and the BiV-ICD was removed and later replaced with a Medtronic ICD with His bundle pacing.  RHC (1/18) with preserved cardiac output.  TEE (1/18) with EF 15%.  CPX in 5/18 with moderate HF limitation.  Most recent echo in 8/18 with EF 20-25%. Most recently, he has lost capture of His bundle lead and AV delay was increased to limit RV pacing.  Today, he is RV paced.  He has been feeling markedly worse, NYHA class IIIb symptoms.  He is volume overloaded on exam and was admitted for diuresis.   - Not much response to IV lasix thus far.  - BMET pending this am - Lasix held for cath this am. Will address after RHC numbers.  - Symptoms markedly worse since lost of His bundle pacing, have discussed options with Dr. Graciela Husbands as we are not going to BiV pace him due to suspected pro-arrhythmia.  Can we revise his His bundle lead?  EP to see.  - Did not tolerate Entresto due to orthostasis, has been taken off losartan with elevated creatinine and low BP.    - Continue spironolactone 12.5 mg daily (did not tolerate increase due to abdominal discomfort).  - Continue digoxin 0.125 mg every other day. Level ok.   - He is off bisoprolol, will leave off for now with concern for low output.  - He did not tolerate Corlanor due to diarrhea.  - Plan for [facility] today. Concerned for low output with loss of His bundle pacing.  If we are unable to resynchronize him in the future, this may be problematic.  He would be an LVAD candidate.  We discussed risks/benefits of procedure today and he agrees to cath tomorrow.  2.  CAD: LHC in  12/17 with stable coronary disease. He has had mild chest discomfort in setting of volume overload.  Slight troponin elevation, most likely demand ischemia with volume overload.  - Continue ASA, Plavix, statin.  Good lipids 10/18. -  As above, plan RHC/LHC today given markedly worse symptoms.  3. Smoking: has stopped smoking since 2/18.  4. 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 Suspect this contributed to HF with loss of AV synchrony (and also was bradycardic). 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  He was BiV paced after 10/17 admission, but LV lead was turned off due to concern for pro-arrhythmia.  He now has a Medtronic ICD and had been His bundle pacing, but has lost capture in His lead and is now NSR with RV pacing.   - See above discussion. EP to see. Will re-check with them today.  5. Ascending aortic aneurysm:  - CTA chest in 8/18 actually did not show a significant aneurysm. No change.  6. Ventricular tachycardia: VT storm in 12/17.   - Continue amiodarone and mexiletine. No VT/VF by device interrogation.  - Recent LFTs and TSH ok with amiodarone use.  No change.  7. PTSD: Patient is on paroxetine due to significant anxiety around shocks.   - Will give low dose Valium prior to cath. No change.  Length of Stay: 2  Luane SchoolMichael Andrew Tillery, PA-C  03/15/2017, 7:57 AM  Advanced Heart Failure Team Pager [phone] (M-F; 7a - 4p)  Please contact [facility] for night-coverage after hours (4p -7a ) and weekends on [email or web address]  Patient seen with PA, agree with above note.    Creatinine up to 1.99 today, got IV Lasix and metolazone yesterday.  Weight not down appreciably.   Discussed with EP, His lead back on with maximal output this morning.    Plan for RHC today (will hold off on coronary angiography with no ACS and creatinine up).  May do angio later during course if we put him on milrinone and creatinine improves.  Have discussed procedure with patient and he  agrees.   Marca AnconaDalton Melanni Benway 03/25/2017 1:15 PM

## 2017-02-25 ENCOUNTER — Inpatient Hospital Stay (HOSPITAL_COMMUNITY): Payer: Medicaid Other

## 2017-02-25 DIAGNOSIS — Z01810 Encounter for preprocedural cardiovascular examination: Secondary | ICD-10-CM

## 2017-02-25 DIAGNOSIS — I255 Ischemic cardiomyopathy: Secondary | ICD-10-CM

## 2017-02-25 DIAGNOSIS — N179 Acute kidney failure, unspecified: Secondary | ICD-10-CM

## 2017-02-25 LAB — TYPE AND SCREEN
ABO/RH(D): A NEG
Antibody Screen: NEGATIVE

## 2017-02-25 LAB — CBC WITH DIFFERENTIAL/PLATELET
Basophils Absolute: 0 10*3/uL (ref 0.0–0.1)
Basophils Relative: 1 %
EOS PCT: 6 %
Eosinophils Absolute: 0.3 10*3/uL (ref 0.0–0.7)
HCT: 34.2 % — ABNORMAL LOW (ref 39.0–52.0)
HEMOGLOBIN: 11.3 g/dL — AB (ref 13.0–17.0)
LYMPHS ABS: 1.1 10*3/uL (ref 0.7–4.0)
LYMPHS PCT: 23 %
MCH: 30.9 pg (ref 26.0–34.0)
MCHC: 33 g/dL (ref 30.0–36.0)
MCV: 93.4 fL (ref 78.0–100.0)
Monocytes Absolute: 0.3 10*3/uL (ref 0.1–1.0)
Monocytes Relative: 7 %
Neutro Abs: 2.9 10*3/uL (ref 1.7–7.7)
Neutrophils Relative %: 63 %
PLATELETS: 230 10*3/uL (ref 150–400)
RBC: 3.66 MIL/uL — AB (ref 4.22–5.81)
RDW: 14.5 % (ref 11.5–15.5)
WBC: 4.6 10*3/uL (ref 4.0–10.5)

## 2017-02-25 LAB — PROTIME-INR
INR: 1.06
Prothrombin Time: 13.7 seconds (ref 11.4–15.2)

## 2017-02-25 LAB — HEPARIN LEVEL (UNFRACTIONATED): Heparin Unfractionated: 0.54 IU/mL (ref 0.30–0.70)

## 2017-02-25 LAB — COMPREHENSIVE METABOLIC PANEL
ALT: 25 U/L (ref 17–63)
AST: 29 U/L (ref 15–41)
Albumin: 3.6 g/dL (ref 3.5–5.0)
Alkaline Phosphatase: 58 U/L (ref 38–126)
Anion gap: 8 (ref 5–15)
BILIRUBIN TOTAL: 1.1 mg/dL (ref 0.3–1.2)
BUN: 26 mg/dL — AB (ref 6–20)
CO2: 27 mmol/L (ref 22–32)
CREATININE: 1.85 mg/dL — AB (ref 0.61–1.24)
Calcium: 9.1 mg/dL (ref 8.9–10.3)
Chloride: 100 mmol/L — ABNORMAL LOW (ref 101–111)
GFR, EST AFRICAN AMERICAN: 43 mL/min — AB (ref >60)
GFR, EST NON AFRICAN AMERICAN: 37 mL/min — AB (ref >60)
Glucose, Bld: 98 mg/dL (ref 65–99)
Potassium: 4.1 mmol/L (ref 3.5–5.1)
Sodium: 135 mmol/L (ref 135–145)
TOTAL PROTEIN: 7.1 g/dL (ref 6.5–8.1)

## 2017-02-25 LAB — URINALYSIS, ROUTINE W REFLEX MICROSCOPIC
BILIRUBIN URINE: NEGATIVE
Glucose, UA: NEGATIVE mg/dL
HGB URINE DIPSTICK: NEGATIVE
Ketones, ur: NEGATIVE mg/dL
Leukocytes, UA: NEGATIVE
Nitrite: NEGATIVE
PROTEIN: NEGATIVE mg/dL
Specific Gravity, Urine: 1.004 — ABNORMAL LOW (ref 1.005–1.030)
pH: 6 (ref 5.0–8.0)

## 2017-02-25 LAB — URIC ACID: Uric Acid, Serum: 8.4 mg/dL — ABNORMAL HIGH (ref 4.4–7.6)

## 2017-02-25 LAB — APTT: APTT: 80 s — AB (ref 24–36)

## 2017-02-25 LAB — SURGICAL PCR SCREEN
MRSA, PCR: NEGATIVE
Staphylococcus aureus: NEGATIVE

## 2017-02-25 LAB — HEMOGLOBIN A1C
Hgb A1c MFr Bld: 5.1 % (ref 4.8–5.6)
Mean Plasma Glucose: 99.67 mg/dL

## 2017-02-25 LAB — LIPID PANEL
CHOL/HDL RATIO: 2.7 ratio
CHOLESTEROL: 148 mg/dL (ref 0–200)
HDL: 54 mg/dL (ref >40)
LDL Cholesterol: 83 mg/dL (ref 0–99)
TRIGLYCERIDES: 56 mg/dL (ref <150)
VLDL: 11 mg/dL (ref 0–40)

## 2017-02-25 LAB — T4, FREE: Free T4: 1.28 ng/dL — ABNORMAL HIGH (ref 0.61–1.12)

## 2017-02-25 LAB — ANTITHROMBIN III: AntiThromb III Func: 99 % (ref 75–120)

## 2017-02-25 LAB — LACTATE DEHYDROGENASE: LDH: 224 U/L — AB (ref 98–192)

## 2017-02-25 LAB — PREALBUMIN: PREALBUMIN: 23.6 mg/dL (ref 18–38)

## 2017-02-25 LAB — HIV ANTIBODY (ROUTINE TESTING W REFLEX): HIV Screen 4th Generation wRfx: NONREACTIVE

## 2017-02-25 LAB — PSA: PROSTATIC SPECIFIC ANTIGEN: 1.57 ng/mL (ref 0.00–4.00)

## 2017-02-25 LAB — TSH
TSH: 1.194 u[IU]/mL (ref 0.350–4.500)
TSH: 1.187 u[IU]/mL (ref 0.350–4.500)

## 2017-02-25 LAB — PLATELET INHIBITION P2Y12: Platelet Function  P2Y12: 99 [PRU] — ABNORMAL LOW (ref 194–418)

## 2017-02-25 LAB — MAGNESIUM: Magnesium: 2 mg/dL (ref 1.7–2.4)

## 2017-02-25 MED ORDER — POTASSIUM CHLORIDE CRYS ER 20 MEQ PO TBCR
20.0000 meq | EXTENDED_RELEASE_TABLET | Freq: Once | ORAL | Status: AC
  Administered 2017-02-25: 20 meq via ORAL
  Filled 2017-02-25: qty 1

## 2017-02-25 MED ORDER — TRAMADOL HCL 50 MG PO TABS
50.0000 mg | ORAL_TABLET | Freq: Four times a day (QID) | ORAL | Status: DC | PRN
  Administered 2017-02-25 – 2017-03-02 (×6): 100 mg via ORAL
  Filled 2017-02-25 (×7): qty 2

## 2017-02-25 MED ORDER — LIDOCAINE-EPINEPHRINE (PF) 1 %-1:200000 IJ SOLN
INTRAMUSCULAR | Status: DC | PRN
  Administered 2017-02-25: 20 mL

## 2017-02-25 MED ORDER — GELATIN ABSORBABLE 12-7 MM EX MISC
CUTANEOUS | Status: AC
  Filled 2017-02-25: qty 1

## 2017-02-25 MED ORDER — CEFAZOLIN SODIUM-DEXTROSE 2-4 GM/100ML-% IV SOLN
INTRAVENOUS | Status: AC
  Filled 2017-02-25: qty 100

## 2017-02-25 MED ORDER — FUROSEMIDE 10 MG/ML IJ SOLN
80.0000 mg | Freq: Two times a day (BID) | INTRAMUSCULAR | Status: DC
  Administered 2017-02-25 – 2017-02-27 (×4): 80 mg via INTRAVENOUS
  Filled 2017-02-25 (×4): qty 8

## 2017-02-25 MED ORDER — METOLAZONE 2.5 MG PO TABS
2.5000 mg | ORAL_TABLET | Freq: Once | ORAL | Status: AC
  Administered 2017-02-25: 2.5 mg via ORAL
  Filled 2017-02-25: qty 1

## 2017-02-25 MED ORDER — IOPAMIDOL (ISOVUE-300) INJECTION 61%
INTRAVENOUS | Status: AC
  Administered 2017-02-25: 10:00:00
  Filled 2017-02-25: qty 30

## 2017-02-25 MED ORDER — LIDOCAINE-EPINEPHRINE (PF) 1 %-1:200000 IJ SOLN
INTRAMUSCULAR | Status: AC
  Filled 2017-02-25: qty 30

## 2017-02-25 MED ORDER — ENSURE ENLIVE PO LIQD
237.0000 mL | Freq: Every day | ORAL | Status: DC
  Administered 2017-02-28 – 2017-03-08 (×3): 237 mL via ORAL

## 2017-02-25 NOTE — Progress Notes (Signed)
Pre-VAD Cardiac Surgery  Carotid Findings:  Bilateral 1-39% ICA stenosis, antegrade vertebral flow.   Brachial 140, Tri 134, Tri  Lower  Extremity Right Left  Dorsalis Pedis 149, Tri 141, Tri  Posterior Tibial 170, Tri 154, Tri  Ankle/Brachial Indices 1.21 1.10    [name] [name]- RDMS, RVT 12:16 PM  02/25/2017

## 2017-02-25 NOTE — Progress Notes (Signed)
ANTICOAGULATION CONSULT NOTE - Follow Up Consult  Pharmacy Consult for heparin Indication: LV thrombus  Allergies  Allergen Reactions  . Corlanor [Ivabradine] Diarrhea    Patient Measurements: Height: 5\' 11"  (180.3 cm) Weight: 218 lb (98.9 kg)(scale b) IBW/kg (Calculated) : 75.3 Heparin Dosing Weight: 95.9 kg   Vital Signs: Temp: 97.2 F (36.2 C) (01/03 0513) Temp Source: Oral (01/03 0513) BP: 111/53 (01/03 0513) Pulse Rate: 69 (01/03 0513)  Labs: Recent Labs    23-Feb-2017 1613 02/23/2017 2124 02/23/17 0413 02/23/17 1229 03/23/2017 0742 02/25/2017 2235 02/25/17 0433  HGB 11.9*  --   --   --  11.6*  --  11.3*  HCT 36.4*  --   --   --  35.9*  --  34.2*  PLT 269  --   --   --  267  --  230  APTT  --   --   --   --   --   --  80*  LABPROT  --   --   --  13.7  --   --  13.7  INR  --   --   --  1.06  --   --  1.06  HEPARINUNFRC  --   --   --   --   --  0.39 0.54  CREATININE 1.76*  --  1.69*  --  1.99*  --  1.85*  TROPONINI <0.03 0.03* 0.03*  --   --   --   --     Estimated Creatinine Clearance: 49 mL/min (A) (by C-G formula based on SCr of 1.85 mg/dL (H)).   Medical History: Past Medical History:  Diagnosis Date  . Anxiety   . Automatic implantable cardioverter-defibrillator in situ    a. Medtronic ICD.  [name] CAD (coronary artery disease)    a.  Severe LAD stenosis 2/2 acute thrombus - BMS 2009;  b. 06/01/11 Cath - LAD 20 isr, 40m (3.0x16 Veri-flex BMS), OM1 100p, EF 20-25%;  c. 07/2012 Abnl Cardiolite;  d. 08/2012 Cath/PCI: LM nl, LAD patent stents, D2 80-90 (jailed), LCX 90 p/m (4.0x24 Promus Premier DES), OM1 100, OM2 80-90p (3.0x20 Promus Premier DES), RCA patent mid stent, 40d, EF 25%.  e. 10/29/15 LHC stable disease with patent stents  . Chronic systolic CHF (congestive heart failure), NYHA class 3 (HCC) - low output state 12/12/2014  . Depression   . Dyspnea   . History of rheumatic fever 1962  . HLD (hyperlipidemia) mixed  . HTN (hypertension)   . Ischemic cardiomyopathy     a.  EF 30-35% 2010;  b.  EF 20-25% by LV gram 08/2012. c. EF 15% by echo 11/2014.  [name] Myocardial infarction [facility]) 1998; 2002; ~ 2010  . Paroxysmal ventricular tachycardia (HCC)    a. VFlutter  CL 210 msec  Rx shock 04/2011  . PVC's (premature ventricular contractions)   . RBBB   . TIA (transient ischemic attack)    a. Dx 11/2014, continued on ASA/Plavix.    Medications:  Scheduled:  . iopamidol      . amiodarone  200 mg Oral BID  . aspirin EC  81 mg Oral Daily  . atorvastatin  40 mg Oral Daily  . clopidogrel  75 mg Oral Daily  . digoxin  125 mcg Oral QODAY  . famotidine  20 mg Oral BID  . furosemide  80 mg Intravenous Q8H  . magnesium oxide  400 mg Oral Daily  . metolazone  2.5 mg Oral Once  .  mexiletine  200 mg Oral Q12H  . PARoxetine  20 mg Oral Daily  . potassium chloride  20 mEq Oral Once  . potassium chloride  40 mEq Oral BID  . sodium chloride flush  3 mL Intravenous Q12H  . spironolactone  12.5 mg Oral Daily  . tamsulosin  0.4 mg Oral Daily    Assessment: 61 yom admitted for increased SOB, abdominal swelling, and orthopnea with known history of CHF. Patient went for RHC today and found to have suspected LV thrombus. Pharmacy consulted to start heparin 2 hours after sheath removal (charted removed on 1/2 at 1340). Patient was not on anticoagulation prior to admission.   Heparin was restarted at 1545 on 1/2. Initial level after restart was 0.39, within goal range. Repeat confirmatory level is therapeutic at 0.54 on 1550 units/hr. Hgb is stable at 11.3 today. Platelets were within normal limits. No infusion issues or signs/symptoms of bleeding documented.  Goal of Therapy:  Heparin level 0.3-0.7 units/ml Monitor platelets by anticoagulation protocol: Yes   Plan:  Continue heparin infusion at 1550 units/hr Check anti-Xa level daily while on heparin Continue to monitor H&H and platelets  Girard CooterKimberly Perkins, PharmD Clinical Pharmacist  Pager: [phone] Clinical Phone  for 02/26/2016 until 3:30pm: [phone] If after 3:30pm, please call main pharmacy at [phone] 02/25/2017,8:37 AM

## 2017-02-25 NOTE — Progress Notes (Signed)
Report given to receiving nurse, as pt is being transferred from 3E21 to [phone].  Pt states that he will call to notify family.

## 2017-02-25 NOTE — Progress Notes (Signed)
Patient arrived via bed to [phone].  Placed on telemetry monitor (with second verification).  Awaiting tunneled line insertion in order to initiate CVP monitoring.  Milrinone infusing at 0.25 mcg/kg/min, Heparin infusing at 1,550 units/hr.

## 2017-02-25 NOTE — Progress Notes (Signed)
*  PRELIMINARY RESULTS* Vascular Ultrasound Bilateral lower extremity venous duplex has been completed.  Preliminary findings: No evidence of deep vein thrombosis or baker's cysts bilaterally.   [name] FischerCharlotte C Riggin [name] 02/25/2017, 12:06 PM

## 2017-02-25 NOTE — Progress Notes (Signed)
Patient to IR for tunneled catheter insertion.

## 2017-02-25 NOTE — Progress Notes (Signed)
Initial Nutrition Assessment  DOCUMENTATION CODES:   Obesity unspecified  INTERVENTION:    Ensure Enlive po daily, each supplement provides 350 kcal and 20 grams of protein  NUTRITION DIAGNOSIS:   Increased nutrient needs related to chronic illness as evidenced by estimated needs  GOAL:   Patient will meet greater than or equal to 90% of their needs  MONITOR:   PO intake, Supplement acceptance, Labs, Weight trends, Skin, I & O's  REASON FOR ASSESSMENT:   Consult LVAD Eval  ASSESSMENT:   [name] is a 62 y.o. 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 He is admitted with CHF.   Pt is a candidate for destination therapy LVAD implantation. He reports a good appetite. PO intake 100%. Was eating well PTA. Denies any recent unintentional weight loss. Labs and medications reviewed. CBG's [id number].  Nutrition focused physical exam completed.   No muscle or subcutaneous fat depletion noticed.  Currently Heart Healthy diet provides ~ 1850 kcals, 88 gm protein. Pt would benefit from addition of oral nutrition supplement.  Diet Order:  Diet Heart Room service appropriate? Yes; Fluid consistency: Thin  EDUCATION NEEDS:   Not appropriate for education at this time  Skin:  Skin Assessment: Reviewed RN Assessment  Last BM:  03/24/2017  Height:   Ht Readings from Last 1 Encounters:  02/12/2017 5\' 11"  (1.803 m)    Weight:   Wt Readings from Last 1 Encounters:  02/25/17 218 lb (98.9 kg)    Ideal Body Weight:  78.1 kg  BMI:  Body mass index is 30.4 kg/m.  Estimated Nutritional Needs:   Kcal:  2200-2400  Protein:  110-125 gm  Fluid:  per MD  Maureen ChattersKatie Dalin Caldera, RD, LDN Pager #: [phone] After-Hours Pager #: [phone]

## 2017-02-25 NOTE — Progress Notes (Addendum)
CVP transduced via PICC once patient returned from IR.  Leveled and zeroed with a CVP 9-10.  Tubing changed on Milrinone and Heparin drips prior to connecting to PICC.  Co-ox sent to Respiratory (ordered this am prior to PICC insertion.)  PCR and urinalysis sent to lab.

## 2017-02-25 NOTE — Consult Note (Signed)
Consultation Note Date: 02/25/2017   Patient Name: [name]  DOB: 07/24/1955  MRN: [id number]  Age / Sex: 62 y.o., male  PCP: Cyndi Bender, PA-C Referring Physician: Larey Dresser, MD  Reason for Consultation: VAD eval  HPI/Patient Profile: 62 y.o. male  with past medical history of systolic CHF (EF 29-92%), CAD, s/p Medtronic AICD, VT, PTSD s/p ICD discharge, depression, anxiety, HLD, HTN, TIA admitted on 42/68/3419 with systolic heart failure exacerbation. Palliative care requested for VAD evaluation.   Clinical Assessment and Goals of Care: I met today with [name] along with his roommate at bedside. I have met him ~1 year ago when VAD conversations were beginning then. He has had some decline but still fairly functional at home. Now inotrope dependent in hospital which is concerning with his VT history but seems to be tolerating so far.   We discussed more about VAD workup and process. He understands and trusts the heart failure team. He is very hopeful for VAD placement with goals of improved QOL (being able to get out of his house more), more time with his daughters and family, and ultimately his goal is to be able to ride his motorcycle again.   We did discuss Advance Directives and he is willing for trials of aggressive care if we believe that this will return him to some sort of QOL. We speaks of his faith and knowing where he is going and has thought much about death and says he has found a peace when his time comes. He feels he has more time and is hopeful to pursue VAD placement.   Mr. Chausse's anxiety which seemed debilitating last year seems much improvement. He seems to be in a much better state mentally and emotionally for VAD placement. All questions/concerns addressed. Emotional support provided.   Primary Decision Maker PATIENT    SUMMARY OF RECOMMENDATIONS   - Good VAD candidate  from palliative perspective  Code Status/Advance Care Planning:  Full code   Symptom Management:   Per heart failure team.   Palliative Prophylaxis:   Frequent Pain Assessment  Additional Recommendations (Limitations, Scope, Preferences):  Full Scope Treatment  Psycho-social/Spiritual:   Desire for further Chaplaincy support:yes  Additional Recommendations: Caregiving  Support/Resources  Prognosis:   Unable to determine  Discharge Planning: To Be Determined      Primary Diagnoses: Present on Admission: . Acute on chronic systolic heart failure ([place])   I have reviewed the medical record, interviewed the patient and family, and examined the patient. The following aspects are pertinent.  Past Medical History:  Diagnosis Date  . Anxiety   . Automatic implantable cardioverter-defibrillator in situ    a. Medtronic ICD.  [name] CAD (coronary artery disease)    a.  Severe LAD stenosis 2/2 acute thrombus - BMS 2009;  b. 06/01/11 Cath - LAD 20 isr, 10m(3.0x16 Veri-flex BMS), OM1 100p, EF 20-25%;  c. 07/2012 Abnl Cardiolite;  d. 08/2012 Cath/PCI: LM nl, LAD patent stents, D2 80-90 (jailed), LCX 90 p/m (4.0x24  Promus Premier DES), OM1 100, OM2 80-90p (3.0x20 Promus Premier DES), RCA patent mid stent, 40d, EF 25%.  e. 10/29/15 LHC stable disease with patent stents  . Chronic systolic CHF (congestive heart failure), NYHA class 3 (HCC) - low output state 12/12/2014  . Depression   . Dyspnea   . History of rheumatic fever 1962  . HLD (hyperlipidemia) mixed  . HTN (hypertension)   . Ischemic cardiomyopathy    a.  EF 30-35% 2010;  b.  EF 20-25% by LV gram 08/2012. c. EF 15% by echo 11/2014.  . Myocardial infarction (HCC) 1998; 2002; ~ 2010  . Paroxysmal ventricular tachycardia (HCC)    a. VFlutter  CL 210 msec  Rx shock 04/2011  . PVC's (premature ventricular contractions)   . RBBB   . TIA (transient ischemic attack)    a. Dx 11/2014, continued on ASA/Plavix.   Social History    Socioeconomic History  . Marital status: Legally Separated    Spouse name: None  . Number of children: None  . Years of education: None  . Highest education level: None  Social Needs  . Financial resource strain: None  . Food insecurity - worry: None  . Food insecurity - inability: None  . Transportation needs - medical: None  . Transportation needs - non-medical: None  Occupational History  . None  Tobacco Use  . Smoking status: Former Smoker    Packs/day: 1.00    Years: 47.00    Pack years: 47.00    Types: Cigarettes    Last attempt to quit: 11/20/2015    Years since quitting: 1.2  . Smokeless tobacco: Never Used  Substance and Sexual Activity  . Alcohol use: Yes    Alcohol/week: 3.6 oz    Types: 6 Cans of beer per week  . Drug use: No  . Sexual activity: Not Currently  Other Topics Concern  . None  Social History Narrative   Married; full time.    Family History  Problem Relation Age of Onset  . Cancer Father   . Cancer Sister        twin sister and only one has cancer, unknown what kind  . Hypertension Brother   . Cancer Unknown        family hx  . Heart attack Neg Hx   . Stroke Neg Hx    Scheduled Meds: . amiodarone  200 mg Oral BID  . aspirin EC  81 mg Oral Daily  . atorvastatin  40 mg Oral Daily  . clopidogrel  75 mg Oral Daily  . digoxin  125 mcg Oral QODAY  . famotidine  20 mg Oral BID  . feeding supplement (ENSURE ENLIVE)  237 mL Oral Q1500  . furosemide  80 mg Intravenous BID  . magnesium oxide  400 mg Oral Daily  . mexiletine  200 mg Oral Q12H  . PARoxetine  20 mg Oral Daily  . potassium chloride  40 mEq Oral BID  . sodium chloride flush  3 mL Intravenous Q12H  . spironolactone  12.5 mg Oral Daily  . tamsulosin  0.4 mg Oral Daily   Continuous Infusions: . sodium chloride 250 mL (02/25/17 0956)  . heparin 1,550 Units/hr (02/25/17 0709)  . milrinone 0.25 mcg/kg/min (02/25/17 0956)   PRN Meds:.sodium chloride, acetaminophen, ALPRAZolam,  docusate sodium, nitroGLYCERIN, ondansetron (ZOFRAN) IV, sodium chloride flush, traMADol, triamcinolone cream Allergies  Allergen Reactions  . Corlanor [Ivabradine] Diarrhea   Review of Systems  Constitutional: Positive for fatigue. Negative   for appetite change.  Respiratory: Negative for shortness of breath.     Physical Exam  Constitutional: He is oriented to person, place, and time. He appears well-developed.  HENT:  Head: Normocephalic and atraumatic.  Cardiovascular:  AV paced  Pulmonary/Chest: Effort normal. No accessory muscle usage. No tachypnea. No respiratory distress.  Abdominal: Soft. Normal appearance.  Neurological: He is alert and oriented to person, place, and time.  Nursing note and vitals reviewed.   Vital Signs: BP (!) 111/53 (BP Location: Left Arm)   Pulse 69   Temp (!) 97.2 F (36.2 C) (Oral)   Resp 18   Ht 5' 11" (1.803 m)   Wt 98.9 kg (218 lb) Comment: scale b  SpO2 100%   BMI 30.40 kg/m  Pain Assessment: 0-10 POSS *See Group Information*: 1-Acceptable,Awake and alert Pain Score: 1    SpO2: SpO2: 100 % O2 Device:SpO2: 100 % O2 Flow Rate: .O2 Flow Rate (L/min): 2 L/min  IO: Intake/output summary:   Intake/Output Summary (Last 24 hours) at 02/25/2017 1404 Last data filed at 02/25/2017 1011 Gross per 24 hour  Intake 1801.63 ml  Output 3452 ml  Net -1650.37 ml    LBM: Last BM Date: 02/28/2017 Baseline Weight: Weight: 100.1 kg (220 lb 11.2 oz) Most recent weight: Weight: 98.9 kg (218 lb)(scale b)     Palliative Assessment/Data: 70%    Time Total: 70 min  Greater than 50%  of this time was spent counseling and coordinating care related to the above assessment and plan.  Signed by: Vinie Sill, NP Palliative Medicine Team Pager # [phone] (M-F 8a-5p) Team Phone # [phone] (Nights/Weekends)

## 2017-02-25 NOTE — Progress Notes (Addendum)
Patient ID: [name] LuriaLewis R [name], male   DOB: 01/11/1956, 62 y.o.   MRN: [id number]     Advanced Heart Failure Rounding Note  HF Cardiology: [name]   Subjective:    Started on milrinone 0.25 mcg/kg/min and lasix after RHC showed low cardiac output and elevated filling pressures.   Awaiting tunneled PICC.   Feeling somewhat better but remains SOB. Denies lightheadedness or dizziness. No CP.   Negative 1.9 and down 3 lbs. Creatinine 1.99 -> 1.85  On heparin gtt for suspected LV thrombus.   RHC 03/06/2017 RA mean 9 RV 79/12 PA 85/36, mean 59 PCWP mean 29 Oxygen saturations: PA 51% AO 98% Cardiac Output (Fick) 3.95  Cardiac Index (Fick) 1.8 PVR 7.6 WU PAPi 5  Echo (1/2): EF 20-25% with severely dilated LV, severe RV dilation with mild to moderately decreased systolic function, moderate MR, moderate AI. Suspect LV thrombus.   Objective:   Weight Range: 218 lb (98.9 kg) Body mass index is 30.4 kg/m.   Vital Signs:   Temp:  [97.2 F (36.2 C)-98.1 F (36.7 C)] 97.2 F (36.2 C) (01/03 0513) Pulse Rate:  [69-91] 69 (01/03 0513) Resp:  [8-20] 18 (01/03 0513) BP: (102-126)/(44-78) 111/53 (01/03 0513) SpO2:  [95 %-100 %] 100 % (01/03 0513) Weight:  [218 lb (98.9 kg)] 218 lb (98.9 kg) (01/03 0513) Last BM Date: 03/15/2017  Weight change: Filed Weights   02/23/17 0350 03/06/2017 0449 02/25/17 0513  Weight: 220 lb 14.4 oz (100.2 kg) 221 lb 6.4 oz (100.4 kg) 218 lb (98.9 kg)    Intake/Output:   Intake/Output Summary (Last 24 hours) at 02/25/2017 0751 Last data filed at 02/25/2017 0500 Gross per 24 hour  Intake 739.63 ml  Output 2652 ml  Net -1912.37 ml     Physical Exam   General: NAD.  HEENT: Normal Neck: Supple. JVP at least 8-9 cm. Carotids 2+ bilat; no bruits. No thyromegaly or nodule noted. Cor: PMI nondisplaced. RRR, 2/6 HSM Apex Lungs: CTAB, normal effort. Abdomen: Soft, non-tender, non-distended, no HSM. No bruits or masses. +BS  Extremities: No cyanosis, clubbing, or  rash. R and LLE no edema.  Neuro: Alert & orientedx3, cranial nerves grossly intact. moves all 4 extremities w/o difficulty. Affect pleasant   Telemetry   NSR with His pacing, personally reviewed.   Labs    CBC Recent Labs    03/05/2017 0742 02/25/17 0433  WBC 7.8 4.6  NEUTROABS 5.5 2.9  HGB 11.6* 11.3*  HCT 35.9* 34.2*  MCV 94.7 93.4  PLT 267 230   Basic Metabolic Panel Recent Labs    40/98/1111-01-2017 1613  03/10/2017 0742 02/25/17 0433  NA 136   < > 135 135  K 3.4*   < > 4.1 4.1  CL 96*   < > 98* 100*  CO2 28   < > 27 27  GLUCOSE 89   < > 98 98  BUN 26*   < > 27* 26*  CREATININE 1.76*   < > 1.99* 1.85*  CALCIUM 8.8*   < > 9.4 9.1  MG 2.2  --   --  2.0   < > = values in this interval not displayed.   Liver Function Tests Recent Labs    01-04-2017 1613 02/25/17 0433  AST 35 29  ALT 29 25  ALKPHOS 57 58  BILITOT 0.9 1.1  PROT 7.2 7.1  ALBUMIN 3.8 3.6   No results for input(s): LIPASE, AMYLASE in the last 72 hours. Cardiac Enzymes Recent Labs  03-17-2017 1613 2017-03-17 2124 02/23/17 0413  TROPONINI <0.03 0.03* 0.03*    BNP: BNP (last 3 results) Recent Labs    10/07/16 2103 17-Mar-2017 1331 03/17/17 1613  BNP 809.6* 953.4* 1,016.9*    ProBNP (last 3 results) No results for input(s): PROBNP in the last 8760 hours.   D-Dimer No results for input(s): DDIMER in the last 72 hours. Hemoglobin A1C Recent Labs    02/25/17 0433  HGBA1C 5.1   Fasting Lipid Panel Recent Labs    02/25/17 0433  CHOL 148  HDL 54  LDLCALC 83  TRIG 56  CHOLHDL 2.7   Thyroid Function Tests Recent Labs    02/25/17 0433  TSH 1.187  1.194    Other results:   Imaging   Dg Orthopantogram  Result Date: 03/18/2017 CLINICAL DATA:  Heart failure.  Heart catheterization today. EXAM: ORTHOPANTOGRAM/PANORAMIC COMPARISON:  None. FINDINGS: Multiple prior tooth extractions including all of the maxillary teeth and all of the mandibular molars as well as the second mandibular  bicuspid bilaterally. No evidence of acute fracture or dislocation. No destructive or expansile bone lesions appreciated. IMPRESSION: Multiple tooth extractions.  No focal bone lesion. Electronically Signed   By: Burman Nieves M.D.   On: 02/25/2017 23:16   Dg Chest 2 View  Result Date: 03/18/2017 CLINICAL DATA:  Congestive heart failure EXAM: CHEST  2 VIEW COMPARISON:  02/23/2017 FINDINGS: Cardiac pacemaker. Heart size and pulmonary vascularity are normal. Coronary artery stents. Lungs are clear. No blunting of costophrenic angles. No pneumothorax. Mediastinal contours appear intact. Mild degenerative changes in the spine. IMPRESSION: No active cardiopulmonary disease. Electronically Signed   By: Burman Nieves M.D.   On: 03/10/2017 23:18    Medications:     Scheduled Medications: . amiodarone  200 mg Oral BID  . aspirin EC  81 mg Oral Daily  . atorvastatin  40 mg Oral Daily  . clopidogrel  75 mg Oral Daily  . digoxin  125 mcg Oral QODAY  . famotidine  20 mg Oral BID  . furosemide  80 mg Intravenous Q8H  . magnesium oxide  400 mg Oral Daily  . mexiletine  200 mg Oral Q12H  . PARoxetine  20 mg Oral Daily  . potassium chloride  40 mEq Oral BID  . sodium chloride flush  3 mL Intravenous Q12H  . spironolactone  12.5 mg Oral Daily  . tamsulosin  0.4 mg Oral Daily    Infusions: . sodium chloride    . heparin 1,550 Units/hr (02/25/17 0709)  . milrinone 0.25 mcg/kg/min (02/26/2017 2323)    PRN Medications: sodium chloride, acetaminophen, ALPRAZolam, docusate sodium, nitroGLYCERIN, ondansetron (ZOFRAN) IV, sodium chloride flush, triamcinolone cream   Assessment/Plan   1. Acute on chronic systolic CHF: Ischemic cardiomyopathy. With low cardiac output noted on during admission in 10/17. Had upgrade to Medtronic BiV ICD in 10/17. However, with VT storm in 12/17, the LV lead was turned off due to concerns for pro-arrhythmia.  In 1/18, he developed a device infection and the BiV-ICD was  removed and later replaced with a Medtronic ICD with His bundle pacing.  RHC (1/18) with preserved cardiac output.  TEE (1/18) with EF 15%.  CPX in 5/18 with moderate HF limitation.  Most recent echo in 02/23/2017 with EF 20-25%.  The RV was severely dilated but mild to moderately hypokinetic. Most recently, he lost capture of His bundle lead and AV delay was increased to limit RV pacing.  EP restarted His bundle pacing yesterday with maximal  output to attain capture.  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 Low cardiac index 1.8 on RHC, milrinone begun.  He diuresed better on milrinone, remains volume overloaded.  - Continue milrinone 0.25 mcg/kg/min. Will place tunneled PICC to follow CVP and co-ox.   - Continue IV lasix 80 mg BID and give dose of metolazone 2.5 mg x 1 today.  - Did not tolerate Entresto due to orthostasis, has been taken off losartan with elevated creatinine and low BP.  - Continue spironolactone 12.5 mg daily (did not tolerate increase due to abdominal discomfort).  - Continue digoxin 0.125 mg every other day. Level ok this admit.  - He is off bisoprolol, will leave off for now with low output.  - He did not tolerate Corlanor due to diarrhea.  - I am concerned about his long-term prognosis at this point.  LV lead was pro-arrhythmic and he will likely lose His bundle capture in the near future.  Not sure replacing His lead will be enough to stave off LVAD placement.  Low output on RHC yesterday, milrjnone begun.  He would not be a good long-term milrinone candidate due to pro-arrhythmic effect.  We have discussed LVAD.  He will need some tuning prior to this, would like to see PA pressure down and full diuresis.  Hopefully creatinine will come down with inotropic support.  Will repeat RHC next week on milrinone, hopefully creatinine will be better and will do coronary angiography as well.  Will begin LVAD workup => LVAD coordinators to see and  will alert Dr. Donata Clay.  2.  CAD: LHC in 12/17 with stable coronary disease.  No further CP.  - Continue ASA, Plavix, statin.  Good lipids 10/18. - As above, tentative plan for coronary angio likely next week when creatinine lower.  3. Smoking: has stopped smoking since 2/18.  4. 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 Suspect this contributed to HF with loss of AV synchrony (and also was bradycardic). 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  He was BiV paced after 10/17 admission, but LV lead was turned off due to concern for pro-arrhythmia.  He now has a Medtronic ICD and had been His bundle pacing, but lost capture in His lead and was RV pacing prior to admission.   - See above discussion. EP has seen. His Bundle pacing turned to max output but this may not last long before capture lost fully.  5. Ascending aortic aneurysm: CTA chest in 8/18 actually did not show a significant aneurysm. No change.  6. Ventricular tachycardia: VT storm in 12/17, may have been related to LV pacing.  Milrinone gtt started, would not use long-term due to risk of proarrhythmia.  - Continue amiodarone and mexiletine. No VT/VF by device interrogation.  Increased amiodarone to 200 mg bid with milrinone use.  - Recent LFTs and TSH ok with amiodarone use.  No change - Follow electrolytes closely.  7. PTSD: Patient is on paroxetine due to significant anxiety around shocks.   - Stable.  8. Pulmonary HTN: Severe on RHC 03/05/2017.  Mixed pulmonary venous/pulmonary arterial HTN.  Will diurese then start on sildenafil 20 mg tid when he is better diuresed.  As above, will eventually repeat RHC.  9. AKI on CKD stage 3: Creatinine better today with milrinone.  Continue to follow closely.  Hopefully creatinine rise is predominantly cardiorenal.  10. LV thrombus: Noted on echo.  Started heparin gtt.   Length of Stay: 3  Marca Ancona 02/25/2017 8:51 AM

## 2017-02-25 NOTE — Procedures (Signed)
Placement of right brachial vein PICC.  Tip in lower SVC and ready to use.  Left innominate vein appears to be occluded during placement of left IJ line.  Minimal blood loss and no immediate complication.

## 2017-02-25 NOTE — Plan of Care (Signed)
  Education: Knowledge of General Education information will improve 02/25/2017 1221 - Progressing by Loel LoftyLewis, Angell Pincock P, RN

## 2017-02-25 NOTE — Consult Note (Addendum)
[address]       [place] [id number]             [phone]        Crista LuriaLewis R [facility] Medical Record #[id number] Date of Birth: 01/09/1956  Referring: Marca Anconaalton McLean MD Primary Care: Lonie Peakonroy, Nathan, PA-C Primary Cardiologist:Brian Jens Somrenshaw, MD  Chief Complaint:   Swelling, shortness breath, fatigue  History of Present Illness:    Patient examined, most recent echocardiogram, right heart cath data, left heart coronary angiogram and CT scan of chest images personally reviewed and discussed with patient 62 year old Caucasian male nondiabetic reformed smoker with ischemic cardiomyopathy ejection fraction 20%. He sustained an anterior MI 15 years ago and over time his LV has remodeled has become very dilated with a 6 cm end-diastolic diameter and with diffuse hypokinesia-akinesia. Last echocardiogram shows small LV apical thrombus with moderate severe MR, mild-moderate RV dysfunction with mild tricuspid regurgitation and moderate aortic insufficiency.  The patient has been followed over time by the advanced heart failure clinic. In January the patient had V. tach storm requiring 24 shock deliveries. Right heart cath at that time showed mild pulmonary hypertension with compensated cardiac output.  At this time the patient was admitted with symptoms of advanced heart failure and low cardiac output. Right heart cath demonstrated severe pulmonary hypertension with decompensated cardiac output and mixed venous saturation of 52%. The patient was admitted for diuresis and to initiate milrinone therapy. The patient was started on heparin for the LV apical thrombus. The patient's creatinine was elevated to 1.95. CVP/pulmonary capillary wedge pressure and PA pulsatility index were satisfactory for left ventricular systolic device therapy.  The patient has an AICD-pacemaker and the his bundle lead of the pacemaker was not functioning properly at the time of admission.  The  patient has had no previous thoracic surgery or thoracic trauma. The patient was on warfarin several years ago without bleeding complications The patient is been on Plavix for months to years without bleeding complication. P to Y 12 assay shows significant platelet inhibition currently. There is no evidence of PFO on contrasted echocardiogram. CT scan of the chest shows moderate changes of COPD however last PFTs in 2017 were very satisfactory for sternotomy. Since patient is continued to smoke on and off the PFT should be repeated.  The patient appears to be a candidate for destination therapy LVAD implantation because of his overall decline in functional status matched by severe hemodynamic deterioration.   Current Activity/ Functional Status: Unable to tolerate much activity Lives with his sister   Zubrod Score: At the time of surgery this patient's most appropriate activity status/level should be described as: []     0    Normal activity, no symptoms []     1    Restricted in physical strenuous activity but ambulatory, able to do out light work []     2    Ambulatory and capable of self care, unable to do work activities, up and about                 more than 50%  Of the time                            [x]     3    Only limited self care, in bed greater than 50% of waking hours []     4    Completely disabled, no self care, confined  to bed or chair []     5    Moribund  Past Medical History:  Diagnosis Date  . Anxiety   . Automatic implantable cardioverter-defibrillator in situ    a. Medtronic ICD.  [name] CAD (coronary artery disease)    a.  Severe LAD stenosis 2/2 acute thrombus - BMS 2009;  b. 06/01/11 Cath - LAD 20 isr, 79m (3.0x16 Veri-flex BMS), OM1 100p, EF 20-25%;  c. 07/2012 Abnl Cardiolite;  d. 08/2012 Cath/PCI: LM nl, LAD patent stents, D2 80-90 (jailed), LCX 90 p/m (4.0x24 Promus Premier DES), OM1 100, OM2 80-90p (3.0x20 Promus Premier DES), RCA patent mid stent, 40d, EF 25%.  e. 10/29/15  LHC stable disease with patent stents  . Chronic systolic CHF (congestive heart failure), NYHA class 3 (HCC) - low output state 12/12/2014  . Depression   . Dyspnea   . History of rheumatic fever 1962  . HLD (hyperlipidemia) mixed  . HTN (hypertension)   . Ischemic cardiomyopathy    a.  EF 30-35% 2010;  b.  EF 20-25% by LV gram 08/2012. c. EF 15% by echo 11/2014.  [name] Myocardial infarction [facility]) 1998; 2002; ~ 2010  . Paroxysmal ventricular tachycardia (HCC)    a. VFlutter  CL 210 msec  Rx shock 04/2011  . PVC's (premature ventricular contractions)   . RBBB   . TIA (transient ischemic attack)    a. Dx 11/2014, continued on ASA/Plavix.    Past Surgical History:  Procedure Laterality Date  . CARDIAC CATHETERIZATION N/A 10/29/2015   Procedure: Right/Left Heart Cath and Coronary Angiography;  Surgeon: Cait Locust M Swaziland, MD;  Location: [facility] INVASIVE CV LAB;  Service: Cardiovascular;  Laterality: N/A;  . CARDIAC CATHETERIZATION N/A 03/20/2016   Procedure: Right Heart Cath;  Surgeon: Laurey Morale, MD;  Location: [facility] INVASIVE CV LAB;  Service: Cardiovascular;  Laterality: N/A;  . CARDIAC DEFIBRILLATOR PLACEMENT     MDT single chamber primary prevention ICD implanted by Dr Graciela Husbands as part of MASTER study  . EP IMPLANTABLE DEVICE N/A 12/11/2015   Procedure: BiV Upgrade;  Surgeon: [name], MD;  Location: [facility] INVASIVE CV LAB;  Service: Cardiovascular;  Laterality: N/A;  . EP IMPLANTABLE DEVICE N/A 12/11/2015   Procedure: Pocket Revision/Relocation;  Surgeon: [name], MD;  Location: [facility] INVASIVE CV LAB;  Service: Cardiovascular;  Laterality: N/A;  . EP IMPLANTABLE DEVICE N/A 12/11/2015   Procedure: Lead Revision/Repair;  Surgeon: [name], MD;  Location: [facility] INVASIVE CV LAB;  Service: Cardiovascular;  Laterality: N/A;  . EP IMPLANTABLE DEVICE N/A 03/23/2016   Procedure: Temporary placement of pacemaker lead and generator;  Surgeon: Marinus Maw, MD;  Location: [facility] OR;  Service:  Cardiovascular;  Laterality: N/A;  . EP IMPLANTABLE DEVICE N/A 03/27/2016   Procedure: ICD Implant;  Surgeon: [name], MD;  Location: [facility] INVASIVE CV LAB;  Service: Cardiovascular;  Laterality: N/A;  . GENERATOR REMOVAL N/A 03/23/2016   Procedure: DEVICE REMOVAL;  Surgeon: Marinus Maw, MD;  Location: [facility] OR;  Service: Cardiovascular;  Laterality: N/A;  PVT to back up  . HERNIA REPAIR     "w/gallbladder OR" (08/29/2012)  . IR GENERIC HISTORICAL  03/31/2016   IR US GUIDE VASC ACCESS LEFT 03/31/2016 Simonne Come, MD [facility]-INTERV RAD  . IR GENERIC HISTORICAL  03/31/2016   IR FLUORO GUIDE CV MIDLINE PICC LEFT 03/31/2016 Simonne Come, MD [facility]INTER[facility]  . LAPAROSCOPIC CHOLECYSTECTOMY    . LEFT HEART CATHETERIZATION WITH CORONARY ANGIOGRAM N/A 06/08/2011  Procedure: LEFT HEART CATHETERIZATION WITH CORONARY ANGIOGRAM;  Surgeon: Dolores Patty, MD;  Location: [facility] CATH LAB;  Service: Cardiovascular;  Laterality: N/A;  . PERCUTANEOUS CORONARY STENT INTERVENTION (PCI-S) N/A 06/01/2011   Procedure: PERCUTANEOUS CORONARY STENT INTERVENTION (PCI-S);  Surgeon: Kaleena Corrow M Swaziland, MD;  Location: [facility] CATH LAB;  Service: Cardiovascular;  Laterality: N/A;  . PERCUTANEOUS CORONARY STENT INTERVENTION (PCI-S) N/A 08/29/2012   Procedure: PERCUTANEOUS CORONARY STENT INTERVENTION (PCI-S);  Surgeon: Saqib Cazarez M Swaziland, MD;  Location: [facility] CATH LAB;  Service: Cardiovascular;  Laterality: N/A;  . PERIPHERAL VASCULAR CATHETERIZATION  12/11/2015   Procedure: Upper Extremity Venography;  Surgeon: [name], MD;  Location: [facility] INVASIVE CV LAB;  Service: Cardiovascular;;  . RIGHT HEART CATH N/A 03/11/2017   Procedure: RIGHT HEART CATH;  Surgeon: Laurey Morale, MD;  Location: [facility] INVASIVE CV LAB;  Service: Cardiovascular;  Laterality: N/A;  . TEE WITHOUT CARDIOVERSION N/A 03/20/2016   Procedure: TRANSESOPHAGEAL ECHOCARDIOGRAM (TEE);  Surgeon: Vesta Mixer, MD;  Location: [facility] ENDOSCOPY;  Service: Cardiovascular;  Laterality: N/A;  . TEE WITHOUT  CARDIOVERSION N/A 03/23/2016   Procedure: TRANSESOPHAGEAL ECHOCARDIOGRAM (TEE);  Surgeon: Marinus Maw, MD;  Location: [facility] OR;  Service: Cardiovascular;  Laterality: N/A;    Social History   Tobacco Use  Smoking Status Former Smoker  . Packs/day: 1.00  . Years: 47.00  . Pack years: 47.00  . Types: Cigarettes  . Last attempt to quit: 11/20/2015  . Years since quitting: 1.2  Smokeless Tobacco Never Used    Social History   Substance and Sexual Activity  Alcohol Use Yes  . Alcohol/week: 3.6 oz  . Types: 6 Cans of beer per week    Social History   Socioeconomic History  . Marital status: Legally Separated    Spouse name: Not on file  . Number of children: Not on file  . Years of education: Not on file  . Highest education level: Not on file  Social Needs  . Financial resource strain: Not on file  . Food insecurity - worry: Not on file  . Food insecurity - inability: Not on file  . Transportation needs - medical: Not on file  . Transportation needs - non-medical: Not on file  Occupational History  . Not on file  Tobacco Use  . Smoking status: Former Smoker    Packs/day: 1.00    Years: 47.00    Pack years: 47.00    Types: Cigarettes    Last attempt to quit: 11/20/2015    Years since quitting: 1.2  . Smokeless tobacco: Never Used  Substance and Sexual Activity  . Alcohol use: Yes    Alcohol/week: 3.6 oz    Types: 6 Cans of beer per week  . Drug use: No  . Sexual activity: Not Currently  Other Topics Concern  . Not on file  Social History Narrative   Married; full time.     Allergies  Allergen Reactions  . Corlanor [Ivabradine] Diarrhea    Current Facility-Administered Medications  Medication Dose Route Frequency Provider Last Rate Last Dose  . 0.9 %  sodium chloride infusion  250 mL Intravenous PRN Alford Highland, NP      . acetaminophen (TYLENOL) tablet 650 mg  650 mg Oral Q4H PRN Alford Highland, NP   650 mg at 03/12/2017 2206  . ALPRAZolam Prudy Feeler) tablet  0.5 mg  0.5 mg Oral BID PRN Chakravartti, Jaidip, MD   0.5 mg at 02/26/2017 2207  . amiodarone (PACERONE) tablet 200  mg  200 mg Oral BID Laurey Morale, MD   200 mg at 03/15/2017 2032  . aspirin EC tablet 81 mg  81 mg Oral Daily Alford Highland, NP   81 mg at 02/23/17 9147  . atorvastatin (LIPITOR) tablet 40 mg  40 mg Oral Daily Alford Highland, NP   40 mg at 03/16/2017 1714  . clopidogrel (PLAVIX) tablet 75 mg  75 mg Oral Daily Alford Highland, NP   75 mg at 03/24/2017 8295  . digoxin (LANOXIN) tablet 125 mcg  125 mcg Oral Synetta Shadow, MD      . docusate sodium (COLACE) capsule 100 mg  100 mg Oral Daily PRN Laurey Morale, MD   100 mg at 02/23/17 1727  . famotidine (PEPCID) tablet 20 mg  20 mg Oral BID Alford Highland, NP   20 mg at 03/10/2017 2033  . furosemide (LASIX) injection 80 mg  80 mg Intravenous Q8H Laurey Morale, MD   80 mg at 02/25/17 6213  . heparin ADULT infusion 100 units/mL (25000 units/225mL sodium chloride 0.45%)  1,550 Units/hr Intravenous Continuous Laurey Morale, MD 15.5 mL/hr at 02/25/17 0709 1,550 Units/hr at 02/25/17 0709  . iopamidol (ISOVUE-300) 61 % injection           . magnesium oxide (MAG-OX) tablet 400 mg  400 mg Oral Daily Alford Highland, NP   400 mg at 02/23/2017 0865  . metolazone (ZAROXOLYN) tablet 2.5 mg  2.5 mg Oral Once Graciella Freer, PA-C      . mexiletine (MEXITIL) capsule 200 mg  200 mg Oral Q12H Alford Highland, NP   200 mg at 03/14/2017 2031  . milrinone (PRIMACOR) 20 MG/100 ML (0.2 mg/mL) infusion  0.25 mcg/kg/min Intravenous Continuous Laurey Morale, MD 7.5 mL/hr at 03/08/2017 2323 0.25 mcg/kg/min at 03/16/2017 2323  . nitroGLYCERIN (NITROSTAT) SL tablet 0.4 mg  0.4 mg Sublingual Q5 min PRN Alford Highland, NP      . ondansetron [facility]) injection 4 mg  4 mg Intravenous Q6H PRN Alford Highland, NP      . PARoxetine (PAXIL) tablet 20 mg  20 mg Oral Daily Alford Highland, NP   20 mg at 02/23/2017 7846  . potassium chloride SA  (K-DUR,KLOR-CON) CR tablet 20 mEq  20 mEq Oral Once Graciella Freer, PA-C      . potassium chloride SA (K-DUR,KLOR-CON) CR tablet 40 mEq  40 mEq Oral BID Chilton Si, MD   40 mEq at 03/02/2017 2031  . sodium chloride flush (NS) 0.9 % injection 3 mL  3 mL Intravenous Q12H Alford Highland, NP   3 mL at 03/22/2017 1001  . sodium chloride flush (NS) 0.9 % injection 3 mL  3 mL Intravenous PRN Alford Highland, NP      . spironolactone (ALDACTONE) tablet 12.5 mg  12.5 mg Oral Daily Alford Highland, NP   12.5 mg at 02/23/2017 9629  . tamsulosin (FLOMAX) capsule 0.4 mg  0.4 mg Oral Daily Nada Maclachlan, MD   0.4 mg at 02/23/2017 2031  . triamcinolone cream (KENALOG) 0.1 %   Topical BID PRN Alford Highland, NP        Medications Prior to Admission  Medication Sig Dispense Refill Last Dose  . ALPRAZolam (XANAX) 0.5 MG tablet Take 0.5 mg daily as needed by mouth. For anxiety symptoms.  5 Past Week at Unknown time  . amiodarone (PACERONE) 200 MG tablet TAKE  1 TABLET BY MOUTH EVERY DAY (Patient taking differently: TAKE 1 TABLET (200mg ) BY MOUTH EVERY DAY) 90 tablet 3 02/10/2017 at Unknown time  . aspirin EC 81 MG tablet Take 81 mg by mouth daily.   02/09/2017 at Unknown time  . atorvastatin (LIPITOR) 40 MG tablet TAKE 1 TABLET BY MOUTH EVERY DAY (Patient taking differently: TAKE 1 TABLET (40mg ) BY MOUTH EVERY DAY AT NIGHT) 30 tablet 5 02/21/2017 at Unknown time  . clopidogrel (PLAVIX) 75 MG tablet Take 1 tablet (75 mg total) by mouth daily. 30 tablet 5 02/04/2017 at Unknown time  . digoxin (LANOXIN) 0.125 MG tablet TAKE 1 TABLET BY MOUTH EVERY OTHER DAY (Patient taking differently: TAKE 1 TABLET (0.125mg ) BY MOUTH EVERY OTHER DAY AT NIGHT) 30 tablet 5 Past Week at Unknown time  . docusate sodium (COLACE) 100 MG capsule Take 100 mg by mouth 2 (two) times daily as needed for mild constipation.     . famotidine (PEPCID) 20 MG tablet Take 1 tablet (20 mg total) by mouth 2 (two) times daily. Please contact  PCP for further refills 60 tablet 0 01/23/2017 at Unknown time  . furosemide (LASIX) 40 MG tablet Take 1.5 tablets (60 mg total) daily by mouth. (Patient taking differently: Take 80 mg by mouth 2 (two) times daily. ) 45 tablet 3 02/17/2017 at Unknown time  . hydrOXYzine (VISTARIL) 25 MG capsule Take 25 mg by mouth at bedtime as needed (sleep).    at PRN  . magnesium oxide (MAG-OX) 400 (241.3 Mg) MG tablet Take 1 tablet (400 mg total) by mouth daily. 90 tablet 2 01/24/2017 at Unknown time  . metolazone (ZAROXOLYN) 2.5 MG tablet Take 1 tablet (2.5 mg total) by mouth as directed. (Patient taking differently: Take 2.5 mg daily as needed by mouth (for fluid retention.). ) 5 tablet 0 Past Month at Unknown time  . mexiletine (MEXITIL) 200 MG capsule TAKE ONE CAPSULE BY MOUTH EVERY 12 HOURS (Patient taking differently: TAKE ONE CAPSULE (200mg ) BY MOUTH EVERY 12 HOURS) 60 capsule 3 02/04/2017 at am  . NITROSTAT 0.4 MG SL tablet PLACE 1 TABLET (0.4 MG TOTAL) UNDER THE TONGUE EVERY 5 (FIVE) MINUTES AS NEEDED FOR CHEST PAIN 25 tablet 6  at PRN  . PARoxetine (PAXIL) 20 MG tablet Take 1 tablet (20 mg total) by mouth daily. 30 tablet 3 02/03/2017 at Unknown time  . sildenafil (REVATIO) 20 MG tablet Take 1 tablet (20 mg total) by mouth as needed. (Patient taking differently: Take 20 mg daily as needed by mouth (FOR ED). ) 20 tablet 3  at PRN  . spironolactone (ALDACTONE) 25 MG tablet Take 0.5 tablets (12.5 mg total) by mouth daily. 15 tablet 5 01/23/2017 at Unknown time  . triamcinolone cream (KENALOG) 0.1 % USE 1 APPLICATION TOPICALLY TO THE AFFECTED AREA TWICE A DAY AS NEEDED FOR ECZEMA  5  at PRN    Family History  Problem Relation Age of Onset  . Cancer Father   . Cancer Sister        twin sister and only one has cancer, unknown what kind  . Hypertension Brother   . Cancer Unknown        family hx  . Heart attack Neg Hx   . Stroke Neg Hx      Review of Systems:   ROS      Cardiac Review of  Systems: Y or  [    ]= no  Chest Pain [    ]  Resting  SOB [   ] Exertional SOB  [ yes ]  Orthopnea [  yes]   Pedal Edema [  yes ]    Palpitations yes  ] Syncope  [  ]   Presyncope [   ]  General Review of Systems: [Y] = yes [  ]=no Constitional: recent weight change [ increase ]; anorexia Mahler.Beck  ]; fatigue Mahler.Beck  ]; nausea [  ]; night sweats [  ]; fever [  ]; or chills [  ]                                                               Dental: poor dentition[  ]; Last Dentist visit: Upper teeth absent. Orthopantogram without evidence of infection  Eye : blurred vision [  ]; diplopia [   ]; vision changes [  ];  Amaurosis fugax[  ]; Resp: cough [  ];  wheezing[  ];  hemoptysis[  ]; shortness of breath[  yes]; paroxysmal nocturnal dyspnea[  ]; dyspnea on exertion[yes  ]; or orthopnea[  ];  GI:  gallstones[  ], vomiting[  ];  dysphagia[  ]; melena[  ];  hematochezia [  ]; heartburn[  ];   Hx of  Colonoscopy[  ]; GU: kidney stones [  ]; hematuria[  ];   dysuria [  ];  nocturia[  ];  history of     obstruction [  ]; urinary frequency [  ]             Skin: rash, swelling[  ];, hair loss[  ];  peripheral edema[  ];  or itching[  ]; Musculosketetal: myalgias[  ];  joint swelling[  ];  joint erythema[  ];  joint pain[  ];  back pain[ yes ];  Heme/Lymph: bruising[  ];  bleeding[  ];  anemia[  ];  Neuro: TIA[  ];  headaches[  ];  stroke[  ];  vertigo[  ];  seizures[  ];   paresthesias[  ];  difficulty walking[  ];  Psych:depression[  ]; anxiety[  ];  Endocrine: diabetes[  ];  thyroid dysfunction[  ];  Immunizations: Flu [  ]; Pneumococcal[  ];  Right-hand dominant  Physical Exam: BP (!) 111/53 (BP Location: Left Arm)   Pulse 69   Temp (!) 97.2 F (36.2 C) (Oral)   Resp 18   Ht 5\' 11"  (1.803 m)   Wt 218 lb (98.9 kg) Comment: scale b  SpO2 100%   BMI 30.40 kg/m        Physical Exam  General: Middle-aged Caucasian male chronically ill no acute distress  HEENT: Normocephalic pupils equal ,  dentition adequate but missing upper teeth Neck: Supple without JVD, adenopathy, or bruit Chest: Clear to auscultation, symmetrical breath sounds, no rhonchi, no tenderness             or deformity Cardiovascular: Regular rate and rhythm, positive holosystolic murmur, no gallop, peripheral pulses             palpable in all extremities Abdomen:  Soft, nontender, no palpable mass or organomegaly Extremities: Warm, well-perfused, no clubbing cyanosis edema or tenderness,              no venous stasis changes of the legs Rectal/GU: Deferred Neuro:  Grossly non--focal and symmetrical throughout Skin: Clean and dry without rash or ulceration   Diagnostic Studies & Laboratory data:     Recent Radiology Findings:   Dg Orthopantogram  Result Date: 2017-02-27 CLINICAL DATA:  Heart failure.  Heart catheterization today. EXAM: ORTHOPANTOGRAM/PANORAMIC COMPARISON:  None. FINDINGS: Multiple prior tooth extractions including all of the maxillary teeth and all of the mandibular molars as well as the second mandibular bicuspid bilaterally. No evidence of acute fracture or dislocation. No destructive or expansile bone lesions appreciated. IMPRESSION: Multiple tooth extractions.  No focal bone lesion. Electronically Signed   By: Burman Nieves M.D.   On: 2017/02/27 23:16   Dg Chest 2 View  Result Date: 2017/02/27 CLINICAL DATA:  Congestive heart failure EXAM: CHEST  2 VIEW COMPARISON:  02/23/2017 FINDINGS: Cardiac pacemaker. Heart size and pulmonary vascularity are normal. Coronary artery stents. Lungs are clear. No blunting of costophrenic angles. No pneumothorax. Mediastinal contours appear intact. Mild degenerative changes in the spine. IMPRESSION: No active cardiopulmonary disease. Electronically Signed   By: Burman Nieves M.D.   On: 2017-02-27 23:18     I have independently reviewed the above radiologic studies.  Recent Lab Findings: Lab Results  Component Value Date   WBC 4.6 02/25/2017   HGB  11.3 (L) 02/25/2017   HCT 34.2 (L) 02/25/2017   PLT 230 02/25/2017   GLUCOSE 98 02/25/2017   CHOL 148 02/25/2017   TRIG 56 02/25/2017   HDL 54 02/25/2017   LDLDIRECT 159.2 02/04/2010   LDLCALC 83 02/25/2017   ALT 25 02/25/2017   AST 29 02/25/2017   NA 135 02/25/2017   K 4.1 02/25/2017   CL 100 (L) 02/25/2017   CREATININE 1.85 (H) 02/25/2017   BUN 26 (H) 02/25/2017   CO2 27 02/25/2017   TSH 1.187 02/25/2017   TSH 1.194 02/25/2017   INR 1.06 02/25/2017   HGBA1C 5.1 02/25/2017      Assessment / Plan:     Ischemic cardiomyopathy ejection fraction 20% Decompensated advanced heart failure with low cardiac output and pulmonary hypertension History of ventricular tachycardia prior AICD implantation-no shocks for the last 12 months Chronic renal insufficiency baseline creatinine 1.8-2.0 History of smoking and mild alcohol intake without cirrhosis, prior cholecystectomy  Patient appears to be appropriate candidate for destination therapy indication  left ventricular assist device with aortic valve closure  Pulmonary hypertension and cardiac output and renal function should be optimized prior to surgery. Plavix stopped now  He will need reversal of Coumadin and Plavix at the time of surgery His support network for postoperative care needs to be clarified regarding the ability of his older sister to assist with the patient    .   @ME1 @ 02/25/2017 9:47 AM

## 2017-02-26 ENCOUNTER — Inpatient Hospital Stay (HOSPITAL_COMMUNITY): Payer: Medicaid Other

## 2017-02-26 DIAGNOSIS — Z515 Encounter for palliative care: Secondary | ICD-10-CM

## 2017-02-26 DIAGNOSIS — I272 Pulmonary hypertension, unspecified: Secondary | ICD-10-CM

## 2017-02-26 DIAGNOSIS — Z7189 Other specified counseling: Secondary | ICD-10-CM

## 2017-02-26 LAB — CBC WITH DIFFERENTIAL/PLATELET
BASOS ABS: 0.1 10*3/uL (ref 0.0–0.1)
Basophils Relative: 1 %
Eosinophils Absolute: 0.3 10*3/uL (ref 0.0–0.7)
Eosinophils Relative: 5 %
HEMATOCRIT: 34.8 % — AB (ref 39.0–52.0)
Hemoglobin: 11.3 g/dL — ABNORMAL LOW (ref 13.0–17.0)
LYMPHS PCT: 17 %
Lymphs Abs: 1.1 10*3/uL (ref 0.7–4.0)
MCH: 29.8 pg (ref 26.0–34.0)
MCHC: 32.5 g/dL (ref 30.0–36.0)
MCV: 91.8 fL (ref 78.0–100.0)
Monocytes Absolute: 0.5 10*3/uL (ref 0.1–1.0)
Monocytes Relative: 8 %
NEUTROS ABS: 4.5 10*3/uL (ref 1.7–7.7)
Neutrophils Relative %: 69 %
Platelets: 260 10*3/uL (ref 150–400)
RBC: 3.79 MIL/uL — ABNORMAL LOW (ref 4.22–5.81)
RDW: 14.2 % (ref 11.5–15.5)
WBC: 6.5 10*3/uL (ref 4.0–10.5)

## 2017-02-26 LAB — PULMONARY FUNCTION TEST
DL/VA % PRED: 70 %
DL/VA: 3.27 ml/min/mmHg/L
DLCO COR % PRED: 51 %
DLCO COR: 17.42 ml/min/mmHg
DLCO UNC % PRED: 46 %
DLCO unc: 15.55 ml/min/mmHg
FEF 25-75 POST: 4.15 L/s
FEF 25-75 PRE: 2.76 L/s
FEF2575-%CHANGE-POST: 50 %
FEF2575-%PRED-POST: 144 %
FEF2575-%Pred-Pre: 96 %
FEV1-%Change-Post: 10 %
FEV1-%PRED-PRE: 82 %
FEV1-%Pred-Post: 90 %
FEV1-Post: 3.27 L
FEV1-Pre: 2.97 L
FEV1FVC-%CHANGE-POST: 7 %
FEV1FVC-%Pred-Pre: 107 %
FEV6-%CHANGE-POST: 4 %
FEV6-%Pred-Post: 82 %
FEV6-%Pred-Pre: 78 %
FEV6-Post: 3.78 L
FEV6-Pre: 3.61 L
FEV6FVC-%Change-Post: 1 %
FEV6FVC-%Pred-Post: 104 %
FEV6FVC-%Pred-Pre: 102 %
FVC-%Change-Post: 2 %
FVC-%PRED-POST: 79 %
FVC-%PRED-PRE: 76 %
FVC-POST: 3.8 L
FVC-Pre: 3.69 L
POST FEV1/FVC RATIO: 86 %
PRE FEV1/FVC RATIO: 80 %
Post FEV6/FVC ratio: 100 %
Pre FEV6/FVC Ratio: 98 %
RV % pred: 131 %
RV: 3.14 L
TLC % pred: 96 %
TLC: 6.95 L

## 2017-02-26 LAB — BASIC METABOLIC PANEL
ANION GAP: 8 (ref 5–15)
BUN: 29 mg/dL — ABNORMAL HIGH (ref 6–20)
CALCIUM: 9.3 mg/dL (ref 8.9–10.3)
CHLORIDE: 97 mmol/L — AB (ref 101–111)
CO2: 25 mmol/L (ref 22–32)
Creatinine, Ser: 1.98 mg/dL — ABNORMAL HIGH (ref 0.61–1.24)
GFR calc Af Amer: 40 mL/min — ABNORMAL LOW (ref >60)
GFR calc non Af Amer: 34 mL/min — ABNORMAL LOW (ref >60)
GLUCOSE: 104 mg/dL — AB (ref 65–99)
POTASSIUM: 4.3 mmol/L (ref 3.5–5.1)
Sodium: 130 mmol/L — ABNORMAL LOW (ref 135–145)

## 2017-02-26 LAB — COOXEMETRY PANEL
CARBOXYHEMOGLOBIN: 1.7 % — AB (ref 0.5–1.5)
METHEMOGLOBIN: 0.9 % (ref 0.0–1.5)
O2 Saturation: 66.1 %
Total hemoglobin: 11.5 g/dL — ABNORMAL LOW (ref 12.0–16.0)

## 2017-02-26 LAB — HEPARIN LEVEL (UNFRACTIONATED): HEPARIN UNFRACTIONATED: 0.6 [IU]/mL (ref 0.30–0.70)

## 2017-02-26 LAB — HEPATITIS C ANTIBODY

## 2017-02-26 LAB — HEPATITIS B CORE ANTIBODY, TOTAL: HEP B C TOTAL AB: NEGATIVE

## 2017-02-26 LAB — HEPATITIS B SURFACE ANTIGEN: HEP B S AG: NEGATIVE

## 2017-02-26 LAB — HEPATITIS B SURFACE ANTIBODY,QUALITATIVE: Hep B S Ab: NONREACTIVE

## 2017-02-26 MED ORDER — SILDENAFIL CITRATE 20 MG PO TABS
20.0000 mg | ORAL_TABLET | Freq: Three times a day (TID) | ORAL | Status: DC
  Administered 2017-02-26 – 2017-02-27 (×3): 20 mg via ORAL
  Filled 2017-02-26 (×3): qty 1

## 2017-02-26 MED ORDER — ALBUTEROL SULFATE (2.5 MG/3ML) 0.083% IN NEBU
2.5000 mg | INHALATION_SOLUTION | Freq: Once | RESPIRATORY_TRACT | Status: AC
  Administered 2017-02-26: 2.5 mg via RESPIRATORY_TRACT

## 2017-02-26 NOTE — Progress Notes (Signed)
Palliative:  I met today again with Mr. Kottke and no family at bedside. No further testing planned for today. Mr. Kielty has no further questions or concerns regarding LVAD today. He does complain of headache. No sensitivity to light or sound. He says his head is generalized ache and not severe. Headache has been on/off for past 2 days but sustaining longer today without much relief. Recommend acetaminophen 1000 mg or could consider single dose of low dose opioid if no relief from other measures.   15 min   Vinie Sill, NP Palliative Medicine Team Pager # [phone] (M-F 8a-5p) Team Phone # [phone] (Nights/Weekends)

## 2017-02-26 NOTE — Plan of Care (Signed)
Progressing

## 2017-02-26 NOTE — Progress Notes (Signed)
Patient ID: [name], male   DOB: 05-07-1955, 62 y.o.   MRN: [id number]     Advanced Heart Failure Rounding Note  HF Cardiology: Shirlee Latch   Subjective:    Coox 66.1% on milrinone 0.25 mcg/kg/min this am.  Has RUE PICC (Unable to placed tunneled PICC with R ICD and occluded left subclavian)  Feeling OK this am. Having intermittent tension headaches. No usual history of HAs at home. No focal neurological symptoms.   Negative 1.8 L, though weight up 1 lb. Creatinine 1.98. CVP 6-7   On heparin gtt for suspected LV thrombus.   RHC 02/28/2017 RA mean 9 RV 79/12 PA 85/36, mean 59 PCWP mean 29 Oxygen saturations: PA 51% AO 98% Cardiac Output (Fick) 3.95  Cardiac Index (Fick) 1.8 PVR 7.6 WU PAPi 5  Echo (1/2): EF 20-25% with severely dilated LV, severe RV dilation with mild to moderately decreased systolic function, moderate MR, moderate AI. Suspect LV thrombus.   Objective:   Weight Range: 219 lb 9.3 oz (99.6 kg) Body mass index is 30.62 kg/m.   Vital Signs:   Temp:  [97.6 F (36.4 C)-98.5 F (36.9 C)] 98.5 F (36.9 C) (01/04 0803) Pulse Rate:  [71-72] 72 (01/04 0500) Resp:  [18-20] 20 (01/04 0500) BP: (111-142)/(65-75) 113/65 (01/04 0803) SpO2:  [98 %-100 %] 98 % (01/04 0803) Weight:  [219 lb 9.3 oz (99.6 kg)] 219 lb 9.3 oz (99.6 kg) (01/04 0500) Last BM Date: 03/17/2017  Weight change: Filed Weights   03/17/2017 0449 02/25/17 0513 02/26/17 0500  Weight: 221 lb 6.4 oz (100.4 kg) 218 lb (98.9 kg) 219 lb 9.3 oz (99.6 kg)    Intake/Output:   Intake/Output Summary (Last 24 hours) at 02/26/2017 0901 Last data filed at 02/26/2017 0538 Gross per 24 hour  Intake 2221.39 ml  Output 4025 ml  Net -1803.61 ml     Physical Exam   General: NAD  HEENT: Normal Neck: Supple. JVP appears ~ 7 cm Carotids 2+ bilat; no bruits. No thyromegaly or nodule noted. Cor: PMI nondisplaced. RRR, 2/6 HSM Apex. Lungs: CTAB, normal effort. Abdomen: Soft, non-tender, non-distended, no HSM. No  bruits or masses. +BS  Extremities: No cyanosis, clubbing, or rash. R and LLE no edema.  Neuro: Alert & orientedx3, cranial nerves grossly intact. moves all 4 extremities w/o difficulty. Affect pleasant   Telemetry   NSR with His pacing, personally reviewed.   Labs    CBC Recent Labs    02/25/17 0433 02/26/17 0728  WBC 4.6 6.5  NEUTROABS 2.9 4.5  HGB 11.3* 11.3*  HCT 34.2* 34.8*  MCV 93.4 91.8  PLT 230 260   Basic Metabolic Panel Recent Labs    78/29/56 0433 02/26/17 0728  NA 135 130*  K 4.1 4.3  CL 100* 97*  CO2 27 25  GLUCOSE 98 104*  BUN 26* 29*  CREATININE 1.85* 1.98*  CALCIUM 9.1 9.3  MG 2.0  --    Liver Function Tests Recent Labs    02/25/17 0433  AST 29  ALT 25  ALKPHOS 58  BILITOT 1.1  PROT 7.1  ALBUMIN 3.6   No results for input(s): LIPASE, AMYLASE in the last 72 hours. Cardiac Enzymes No results for input(s): CKTOTAL, CKMB, CKMBINDEX, TROPONINI in the last 72 hours.  BNP: BNP (last 3 results) Recent Labs    10/07/16 2103 03-04-17 1331 Mar 04, 2017 1613  BNP 809.6* 953.4* 1,016.9*    ProBNP (last 3 results) No results for input(s): PROBNP in the last 8760 hours.  D-Dimer No results for input(s): DDIMER in the last 72 hours. Hemoglobin A1C Recent Labs    02/25/17 0433  HGBA1C 5.1   Fasting Lipid Panel Recent Labs    02/25/17 0433  CHOL 148  HDL 54  LDLCALC 83  TRIG 56  CHOLHDL 2.7   Thyroid Function Tests Recent Labs    02/25/17 0433  TSH 1.187  1.194    Other results:   Imaging   Ct Abdomen Pelvis Wo Contrast  Result Date: 02/25/2017 CLINICAL DATA:  Patient with worsening shortness of breath and abdominal swelling. EXAM: CT CHEST, ABDOMEN AND PELVIS WITHOUT CONTRAST TECHNIQUE: Multidetector CT imaging of the chest, abdomen and pelvis was performed following the standard protocol without IV contrast. COMPARISON:  Chest CT 10/22/2016.  Heart is enlarged. FINDINGS: CT CHEST FINDINGS Cardiovascular: Heart is  enlarged.  No pericardial effusion. Mediastinum/Nodes: Similar-appearing prominent mediastinal lymph nodes measuring up to 9 mm (image 21; series 3) within the right peritracheal location. Small hiatal hernia. Normal esophagus. Lungs/Pleura: Patchy ground-glass opacities demonstrated within the left upper lobe (image 81; series 5). No pleural effusion or pneumothorax. Paraseptal emphysematous change. Musculoskeletal: Thoracic spine degenerative changes. No aggressive or acute appearing osseous lesions. CT ABDOMEN PELVIS FINDINGS Hepatobiliary: The liver is normal in size and contour. Multiple hepatic cysts are demonstrated within the left and right hepatic lobes, unchanged. Gallbladder is surgically absent. Pancreas: Unremarkable Spleen: Unremarkable Adrenals/Urinary Tract: The adrenal glands are normal. Stable 13 mm partially exophytic low-attenuation lesion interpolar region left kidney. Urinary bladder is unremarkable. Stomach/Bowel: Normal morphology of the stomach. No evidence for bowel obstruction. No free fluid or free intraperitoneal air. Normal appendix. Vascular/Lymphatic: Normal caliber abdominal aorta. Peripheral calcified atherosclerotic plaque. No retroperitoneal lymphadenopathy. Reproductive: Prostate unremarkable. Other: Left fat containing inguinal hernia. Musculoskeletal: Lumbar spine degenerative changes. Progressed L5-S1 degenerative disc disease with Schmorl's nodes involving the S1 endplate. No aggressive or acute appearing osseous lesions. IMPRESSION: 1. Patchy ground-glass opacities within the left upper lobe may represent infectious or inflammatory process. Recommend follow-up chest CT in 3 months to assess for interval resolution. 2. Borderline mediastinal adenopathy, potentially reactive in etiology. Recommend attention on follow-up. 3. No acute process within the abdomen. Electronically Signed   By: Annia Belt M.D.   On: 02/25/2017 13:08   Ct Chest Wo Contrast  Result Date:  02/25/2017 CLINICAL DATA:  Patient with worsening shortness of breath and abdominal swelling. EXAM: CT CHEST, ABDOMEN AND PELVIS WITHOUT CONTRAST TECHNIQUE: Multidetector CT imaging of the chest, abdomen and pelvis was performed following the standard protocol without IV contrast. COMPARISON:  Chest CT 10/22/2016.  Heart is enlarged. FINDINGS: CT CHEST FINDINGS Cardiovascular: Heart is enlarged.  No pericardial effusion. Mediastinum/Nodes: Similar-appearing prominent mediastinal lymph nodes measuring up to 9 mm (image 21; series 3) within the right peritracheal location. Small hiatal hernia. Normal esophagus. Lungs/Pleura: Patchy ground-glass opacities demonstrated within the left upper lobe (image 81; series 5). No pleural effusion or pneumothorax. Paraseptal emphysematous change. Musculoskeletal: Thoracic spine degenerative changes. No aggressive or acute appearing osseous lesions. CT ABDOMEN PELVIS FINDINGS Hepatobiliary: The liver is normal in size and contour. Multiple hepatic cysts are demonstrated within the left and right hepatic lobes, unchanged. Gallbladder is surgically absent. Pancreas: Unremarkable Spleen: Unremarkable Adrenals/Urinary Tract: The adrenal glands are normal. Stable 13 mm partially exophytic low-attenuation lesion interpolar region left kidney. Urinary bladder is unremarkable. Stomach/Bowel: Normal morphology of the stomach. No evidence for bowel obstruction. No free fluid or free intraperitoneal air. Normal appendix. Vascular/Lymphatic: Normal caliber abdominal aorta. Peripheral  calcified atherosclerotic plaque. No retroperitoneal lymphadenopathy. Reproductive: Prostate unremarkable. Other: Left fat containing inguinal hernia. Musculoskeletal: Lumbar spine degenerative changes. Progressed L5-S1 degenerative disc disease with Schmorl's nodes involving the S1 endplate. No aggressive or acute appearing osseous lesions. IMPRESSION: 1. Patchy ground-glass opacities within the left upper lobe  may represent infectious or inflammatory process. Recommend follow-up chest CT in 3 months to assess for interval resolution. 2. Borderline mediastinal adenopathy, potentially reactive in etiology. Recommend attention on follow-up. 3. No acute process within the abdomen. Electronically Signed   By: Annia Belt M.D.   On: 02/25/2017 13:08   Ir Picc Placement Right >5 Yrs Inc Img Guide  Result Date: 02/25/2017 INDICATION: 62 year old with systolic congestive heart failure. Patient needs central line for milrinone infusion. EXAM: RIGHT ARM PICC LINE PLACEMENT WITH ULTRASOUND AND FLUOROSCOPIC GUIDANCE ATTEMPTED PLACEMENT OF LEFT IJ CENTRAL LINE WITH ULTRASOUND AND FLUOROSCOPIC GUIDANCE MEDICATIONS: None ANESTHESIA/SEDATION: None FLUOROSCOPY TIME:  Fluoroscopy Time: 3 minutes 42 seconds (14 mGy). COMPLICATIONS: None immediate. PROCEDURE: The procedure was explained to the patient. The risks and benefits of the procedure were discussed and the patient's questions were addressed. Informed consent was obtained from the patient. Attempted to place a left IJ tunneled central line. Previous left upper extremity venogram demonstrated occlusion in the left subclavian vein but not clear if left innominate vein was patent. Small left internal jugular vein was identified on ultrasound. Left side of the neck was prepped and draped in sterile fashion. Maximal barrier sterile technique was utilized including caps, mask, sterile gowns, sterile gloves, sterile drape, hand hygiene and skin antiseptic. Skin was anesthetized with 1% lidocaine. 21 gauge needle directed into the left internal jugular vein with ultrasound guidance. A wire would not advance centrally towards the left innominate vein or SVC. Findings compatible with a chronic occlusion at this location. This access was removed. Attention was directed to placing a right arm PICC line because the patient currently has a right chest ICD. The right arm was prepped with  chlorhexidine, draped in the usual sterile fashion using maximum barrier technique (cap and mask, sterile gown, sterile gloves, large sterile sheet, hand hygiene and cutaneous antiseptic). Local anesthesia was attained by infiltration with 1% lidocaine. Ultrasound demonstrated patency of the right brachial vein, and this was documented with an image. Under real-time ultrasound guidance, this vein was accessed with a 21 gauge micropuncture needle and image documentation was performed. The needle was exchanged over a guidewire for a peel-away sheath through which a 36 cm 5 [place] dual lumen power injectable PICC was advanced, and positioned in the upper SVC. Fluoroscopy during the procedure and fluoro spot radiograph confirms appropriate catheter position. The catheter was flushed, secured to the skin, and covered with a sterile dressing. IMPRESSION: Successful placement of a right arm PICC with sonographic and fluoroscopic guidance. The catheter is ready for use. 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 Electronically Signed   By: Richarda Overlie M.D.   On: 02/25/2017 17:05    Medications:     Scheduled Medications: . amiodarone  200 mg Oral BID  . aspirin EC  81 mg Oral Daily  . atorvastatin  40 mg Oral Daily  . digoxin  125 mcg Oral QODAY  . famotidine  20 mg Oral BID  . feeding supplement (ENSURE ENLIVE)  237 mL Oral Q1500  . furosemide  80 mg Intravenous BID  . magnesium oxide  400 mg Oral Daily  . mexiletine  200 mg Oral Q12H  . PARoxetine  20 mg Oral Daily  . potassium chloride  40 mEq Oral BID  . sodium chloride flush  3 mL Intravenous Q12H  . spironolactone  12.5 mg Oral Daily  . tamsulosin  0.4 mg Oral Daily    Infusions: . sodium chloride Stopped (02/25/17 1741)  . heparin 1,550 Units/hr (02/26/17 0028)  . milrinone 0.25 mcg/kg/min (02/25/17 1909)    PRN Medications: sodium chloride, acetaminophen, ALPRAZolam, docusate  sodium, lidocaine-EPINEPHrine, nitroGLYCERIN, ondansetron (ZOFRAN) IV, sodium chloride flush, traMADol, triamcinolone cream   Assessment/Plan   1. Acute on chronic systolic CHF: Ischemic cardiomyopathy. With low cardiac output noted on during admission in 10/17. Had upgrade to Medtronic BiV ICD in 10/17. However, with VT storm in 12/17, the LV lead was turned off due to concerns for pro-arrhythmia.  In 1/18, he developed a device infection and the BiV-ICD was removed and later replaced with a Medtronic ICD with His bundle pacing.  RHC (1/18) with preserved cardiac output.  TEE (1/18) with EF 15%.  CPX in 5/18 with moderate HF limitation.  Most recent echo in 03/10/2017 with EF 20-25%.  The RV was severely dilated but mild to moderately hypokinetic. Most recently, he lost capture of His bundle lead and AV delay was increased to limit RV pacing.  EP restarted His bundle pacing with maximal output to attain capture.  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 Low cardiac index 1.8 on RHC, milrinone begun (he was His pacing at time of RHC).  He was admitted from clinic with NYHA class IV symptoms.  - Coox stable at 66% on Milrinone 0.25 mcg/kg/min.  - Continue IV lasix 80 mg at least this am. With CVP 6-7 will not give metolazone this am.  - Did not tolerate Entresto due to orthostasis, has been taken off losartan with elevated creatinine and low BP.  - Continue spironolactone 12.5 mg daily (did not tolerate increase due to abdominal discomfort).  - Continue digoxin 0.125 mg every other day. Level ok this admit.  - He is off bisoprolol, will leave off for now with low output.  - He did not tolerate Corlanor due to diarrhea.  - Concerned about his long-term prognosis at this point.  LV lead was pro-arrhythmic and he will likely lose His bundle capture in the near future.  Not sure replacing His lead will be enough to stave off LVAD placement.  Low output on RHC  yesterday, milrjnone begun.  He would not be a good long-term milrinone candidate due to pro-arrhythmic effect.  We have discussed LVAD.  He will need some tuning prior to this, would like to see PA pressure down and full diuresis.  Hopefully creatinine will come down with inotropic support.  Will repeat RHC next week on milrinone, hopefully creatinine will be better.  Will begin LVAD workup => LVAD coordinators to see and has seen Dr. Donata ClayVan Trigt.  2.  CAD: LHC in 12/17 with stable coronary disease.  No CP.  - Continue ASA, Plavix, statin.  Good lipids 10/18. 3. Smoking: has stopped smoking since 2/18.  4. 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 Suspect this contributed to HF with loss of AV synchrony (and also was bradycardic). 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  He was BiV paced after 10/17 admission, but LV lead was turned off due to concern for pro-arrhythmia.  He now has a Medtronic  ICD and had been His bundle pacing, but lost capture in His lead and was RV pacing prior to admission.   - See above discussion. EP has seen. His Bundle pacing turned to max output but this may not last long before capture lost fully. No change. 5. Ascending aortic aneurysm: CTA chest in 8/18 actually did not show a significant aneurysm. No change. 6. Ventricular tachycardia: VT storm in 12/17, may have been related to LV pacing.  Milrinone gtt started, would not use long-term due to risk of proarrhythmia.  - Continue amiodarone and mexiletine. No VT/VF by device interrogation.  Increased amiodarone to 200 mg bid with milrinone use.  - Recent LFTs and TSH ok with amiodarone use. No change.  - Follow electrolytes closely.  7. PTSD: Patient is on paroxetine due to significant anxiety around shocks.   - Stable. 8. Pulmonary HTN: Severe on RHC 03/01/2017.  Mixed pulmonary venous/pulmonary arterial HTN.   - Plan on starting sildenafil 20 mg tid today.  As above, will eventually repeat RHC. No  change. 9. AKI on CKD stage 3:  - Creatinine stable on milrinone.  - Continue to follow closely.  10. LV thrombus: Noted on echo.  - Remains on heparin gtt.   Length of Stay: 4  Luane School  02/26/2017 9:01 AM  Advanced Heart Failure Team Pager [phone] (M-F; 7a - 4p)  Please contact [facility] for night-coverage after hours (4p -7a ) and weekends on [email or web address]  Patient seen with PA, agree with the above note.    He feels better on milrinone gtt.  Stronger, easier to walk around in halls.  He diuresed well by I/Os yesterday, not sure weights are accurate. CVP around 8 today.  Creatinine remains around 1.9.  No VT so far with milrinone use.  - Continue IV Lasix 80 mg bid today, likely to po tomorrow.  - Continue milrinone 0.25, co-ox up to 66%.   - Continue amiodarone 200 mg bid while on milrinone.  - Will add sildenafil 20 mg tid today given good diuresis yesterday (mixed PAH/PVH on RHC 1/2).   I am concerned about his long-term prognosis.  He was admitted from clinic with NYHA class IV symptoms.  He had lost His bundle pacing.  We were able to increase output and His pace him at maximal output, but even with His pacing he had low output by RHC, and he is likely to lose capture again before long.  He is now on milrinone and feels better.  Long-term milrinone is not a good option for him given history of VT storm/multiple shocks with PTSD.  We have discussed LVAD and Dr. Donata Clay has seen.  Renal function is a barrier at this time, creatinine currently 1.9 in setting of diuresis.  I will continue milrinone and switch over to po diuretics tomorrow in the hope that creatinine will come back down soon. He had PFTs today, awaiting report.  I will plan RHC again next week to reassess PCWP and PA pressure on sildenafil and milrinone and with diuresis.   Marca Ancona 02/26/2017 3:38 PM

## 2017-02-26 NOTE — Progress Notes (Signed)
ANTICOAGULATION CONSULT NOTE - Follow Up Consult  Pharmacy Consult for heparin Indication: LV thrombus  Allergies  Allergen Reactions  . Corlanor [Ivabradine] Diarrhea    Patient Measurements: Height: 5\' 11"  (180.3 cm) Weight: 219 lb 9.3 oz (99.6 kg) IBW/kg (Calculated) : 75.3 Heparin Dosing Weight: 95.9 kg   Vital Signs: Temp: 98.5 F (36.9 C) (01/04 0803) Temp Source: Oral (01/04 0803) BP: 113/65 (01/04 0803) Pulse Rate: 72 (01/04 0500)  Labs: Recent Labs    02/23/17 1229  03/06/2017 0742 03/08/2017 2235 02/25/17 0433 02/26/17 0728  HGB  --    < > 11.6*  --  11.3* 11.3*  HCT  --   --  35.9*  --  34.2* 34.8*  PLT  --   --  267  --  230 260  APTT  --   --   --   --  80*  --   LABPROT 13.7  --   --   --  13.7  --   INR 1.06  --   --   --  1.06  --   HEPARINUNFRC  --   --   --  0.39 0.54 0.60  CREATININE  --   --  1.99*  --  1.85* 1.98*   < > = values in this interval not displayed.    Estimated Creatinine Clearance: 45.9 mL/min (A) (by C-G formula based on SCr of 1.98 mg/dL (H)).   Medical History: Past Medical History:  Diagnosis Date  . Anxiety   . Automatic implantable cardioverter-defibrillator in situ    a. Medtronic ICD.  [name] CAD (coronary artery disease)    a.  Severe LAD stenosis 2/2 acute thrombus - BMS 2009;  b. 06/01/11 Cath - LAD 20 isr, 55m (3.0x16 Veri-flex BMS), OM1 100p, EF 20-25%;  c. 07/2012 Abnl Cardiolite;  d. 08/2012 Cath/PCI: LM nl, LAD patent stents, D2 80-90 (jailed), LCX 90 p/m (4.0x24 Promus Premier DES), OM1 100, OM2 80-90p (3.0x20 Promus Premier DES), RCA patent mid stent, 40d, EF 25%.  e. 10/29/15 LHC stable disease with patent stents  . Chronic systolic CHF (congestive heart failure), NYHA class 3 (HCC) - low output state 12/12/2014  . Depression   . Dyspnea   . History of rheumatic fever 1962  . HLD (hyperlipidemia) mixed  . HTN (hypertension)   . Ischemic cardiomyopathy    a.  EF 30-35% 2010;  b.  EF 20-25% by LV gram 08/2012. c. EF 15% by  echo 11/2014.  [name] Myocardial infarction [facility]) 1998; 2002; ~ 2010  . Paroxysmal ventricular tachycardia (HCC)    a. VFlutter  CL 210 msec  Rx shock 04/2011  . PVC's (premature ventricular contractions)   . RBBB   . TIA (transient ischemic attack)    a. Dx 11/2014, continued on ASA/Plavix.    Medications:  Scheduled:  . amiodarone  200 mg Oral BID  . aspirin EC  81 mg Oral Daily  . atorvastatin  40 mg Oral Daily  . digoxin  125 mcg Oral QODAY  . famotidine  20 mg Oral BID  . feeding supplement (ENSURE ENLIVE)  237 mL Oral Q1500  . furosemide  80 mg Intravenous BID  . magnesium oxide  400 mg Oral Daily  . mexiletine  200 mg Oral Q12H  . PARoxetine  20 mg Oral Daily  . potassium chloride  40 mEq Oral BID  . sodium chloride flush  3 mL Intravenous Q12H  . spironolactone  12.5 mg Oral Daily  .  tamsulosin  0.4 mg Oral Daily    Assessment: 61 yom admitted for increased SOB, abdominal swelling, and orthopnea with known history of CHF. Patient found to have suspected LV thrombus - pharmacy consulted to start heparin. Patient was not on anticoagulation PTA.   Heparin level was therapeutic at 0.6 on 1550 units/hr. Hgb is stable at 11.3 today. Platelets were WNL. No infusion issues or signs/symptoms of bleeding documented.  Goal of Therapy:  Heparin level 0.3-0.7 units/ml Monitor platelets by anticoagulation protocol: Yes   Plan:  Continue heparin infusion at 1550 units/hr Check anti-Xa level daily while on heparin Continue to monitor H&H and platelets  Girard CooterKimberly Perkins, PharmD Clinical Pharmacist  Pager: [phone] Clinical Phone for 02/27/2016 until 3:30pm: [phone] If after 3:30pm, please call main pharmacy at [phone] 02/26/2017,9:29 AM

## 2017-02-26 NOTE — Progress Notes (Signed)
Progress Note  Patient Name: [name] Date of Encounter: 02/26/2017  Primary Cardiologist:DM  Primary Electrophysiologist: SK   Patient Profile     62 y.o. male admitted with low out put heart failure  Subjective   Feels better less sob no dizziness  Inpatient Medications    Scheduled Meds: . amiodarone  200 mg Oral BID  . aspirin EC  81 mg Oral Daily  . atorvastatin  40 mg Oral Daily  . digoxin  125 mcg Oral QODAY  . famotidine  20 mg Oral BID  . feeding supplement (ENSURE ENLIVE)  237 mL Oral Q1500  . furosemide  80 mg Intravenous BID  . magnesium oxide  400 mg Oral Daily  . mexiletine  200 mg Oral Q12H  . PARoxetine  20 mg Oral Daily  . potassium chloride  40 mEq Oral BID  . sodium chloride flush  3 mL Intravenous Q12H  . spironolactone  12.5 mg Oral Daily  . tamsulosin  0.4 mg Oral Daily   Continuous Infusions: . sodium chloride Stopped (02/25/17 1741)  . heparin 1,550 Units/hr (02/26/17 0028)  . milrinone 0.25 mcg/kg/min (02/25/17 1909)   PRN Meds: sodium chloride, acetaminophen, ALPRAZolam, docusate sodium, lidocaine-EPINEPHrine, nitroGLYCERIN, ondansetron (ZOFRAN) IV, sodium chloride flush, traMADol, triamcinolone cream   Vital Signs    Vitals:   02/25/17 1320 02/25/17 1659 02/26/17 0500 02/26/17 0803  BP: 137/75 (!) 142/71 111/69 113/65  Pulse:  71 72   Resp: 18 20 20    Temp: 97.9 F (36.6 C) 97.6 F (36.4 C) 97.6 F (36.4 C) 98.5 F (36.9 C)  TempSrc: Oral Oral Oral Oral  SpO2:  100% 98% 98%  Weight:   219 lb 9.3 oz (99.6 kg)   Height:        Intake/Output Summary (Last 24 hours) at 02/26/2017 0812 Last data filed at 02/26/2017 0538 Gross per 24 hour  Intake 2221.39 ml  Output 4025 ml  Net -1803.61 ml   Filed Weights   03/04/2017 0449 02/25/17 0513 02/26/17 0500  Weight: 221 lb 6.4 oz (100.4 kg) 218 lb (98.9 kg) 219 lb 9.3 oz (99.6 kg)    Telemetry    P-synchronous/ AV  pacing  - Personally Reviewed  ECG     QRS  214>>165 with  His pacing  - Personally Reviewed  Physical Exam    GEN: No acute distress.   Neck: JVD7 cm  Cardiac: RRR, no   murmurs, rubs, or gallops.  Respiratory: Clear to auscultation bilaterally. GI: Soft, nontender, non-distended  MS: no edema; No deformity. Neuro:  Nonfocal  Psych: Normal affect  Skin Warm and dry   Labs    Chemistry Recent Labs  Lab 02/10/2017 1328 02/15/2017 1613 02/23/17 0413 02/26/2017 0742 02/25/17 0433  NA 133* 136 135 135 135  K 3.6 3.4* 3.3* 4.1 4.1  CL 97* 96* 97* 98* 100*  CO2 25 28 26 27 27   GLUCOSE 95 89 92 98 98  BUN 27* 26* 24* 27* 26*  CREATININE 1.84* 1.76* 1.69* 1.99* 1.85*  CALCIUM 9.5 8.8* 8.4* 9.4 9.1  PROT 7.7 7.2  --   --  7.1  ALBUMIN 4.1 3.8  --   --  3.6  AST 38 35  --   --  29  ALT 31 29  --   --  25  ALKPHOS 61 57  --   --  58  BILITOT 1.1 0.9  --   --  1.1  GFRNONAA 37* 39* 41*  34* 37*  GFRAA 43* 46* 48* 39* 43*  ANIONGAP 11 12 12 10 8      Hematology Recent Labs  Lab 07-20-16 1613 03/01/2017 0742 02/25/17 0433  WBC 6.9 7.8 4.6  RBC 3.85* 3.79* 3.66*  HGB 11.9* 11.6* 11.3*  HCT 36.4* 35.9* 34.2*  MCV 94.5 94.7 93.4  MCH 30.9 30.6 30.9  MCHC 32.7 32.3 33.0  RDW 15.0 14.8 14.5  PLT 269 267 230    Cardiac Enzymes Recent Labs  Lab 07-20-16 1328 07-20-16 1613 07-20-16 2124 02/23/17 0413  TROPONINI 0.03* <0.03 0.03* 0.03*   No results for input(s): TROPIPOC in the last 168 hours.   BNP Recent Labs  Lab 07-20-16 1331 07-20-16 1613  BNP 953.4* 1,016.9*     DDimer No results for input(s): DDIMER in the last 168 hours.   Radiology    Ct Abdomen Pelvis Wo Contrast  Result Date: 02/25/2017 CLINICAL DATA:  Patient with worsening shortness of breath and abdominal swelling. EXAM: CT CHEST, ABDOMEN AND PELVIS WITHOUT CONTRAST TECHNIQUE: Multidetector CT imaging of the chest, abdomen and pelvis was performed following the standard protocol without IV contrast. COMPARISON:  Chest CT 10/22/2016.  Heart is enlarged.  FINDINGS: CT CHEST FINDINGS Cardiovascular: Heart is enlarged.  No pericardial effusion. Mediastinum/Nodes: Similar-appearing prominent mediastinal lymph nodes measuring up to 9 mm (image 21; series 3) within the right peritracheal location. Small hiatal hernia. Normal esophagus. Lungs/Pleura: Patchy ground-glass opacities demonstrated within the left upper lobe (image 81; series 5). No pleural effusion or pneumothorax. Paraseptal emphysematous change. Musculoskeletal: Thoracic spine degenerative changes. No aggressive or acute appearing osseous lesions. CT ABDOMEN PELVIS FINDINGS Hepatobiliary: The liver is normal in size and contour. Multiple hepatic cysts are demonstrated within the left and right hepatic lobes, unchanged. Gallbladder is surgically absent. Pancreas: Unremarkable Spleen: Unremarkable Adrenals/Urinary Tract: The adrenal glands are normal. Stable 13 mm partially exophytic low-attenuation lesion interpolar region left kidney. Urinary bladder is unremarkable. Stomach/Bowel: Normal morphology of the stomach. No evidence for bowel obstruction. No free fluid or free intraperitoneal air. Normal appendix. Vascular/Lymphatic: Normal caliber abdominal aorta. Peripheral calcified atherosclerotic plaque. No retroperitoneal lymphadenopathy. Reproductive: Prostate unremarkable. Other: Left fat containing inguinal hernia. Musculoskeletal: Lumbar spine degenerative changes. Progressed L5-S1 degenerative disc disease with Schmorl's nodes involving the S1 endplate. No aggressive or acute appearing osseous lesions. IMPRESSION: 1. Patchy ground-glass opacities within the left upper lobe may represent infectious or inflammatory process. Recommend follow-up chest CT in 3 months to assess for interval resolution. 2. Borderline mediastinal adenopathy, potentially reactive in etiology. Recommend attention on follow-up. 3. No acute process within the abdomen. Electronically Signed   By: Annia Beltrew  Davis M.D.   On: 02/25/2017  13:08   Dg Orthopantogram  Result Date: 03/15/2017 CLINICAL DATA:  Heart failure.  Heart catheterization today. EXAM: ORTHOPANTOGRAM/PANORAMIC COMPARISON:  None. FINDINGS: Multiple prior tooth extractions including all of the maxillary teeth and all of the mandibular molars as well as the second mandibular bicuspid bilaterally. No evidence of acute fracture or dislocation. No destructive or expansile bone lesions appreciated. IMPRESSION: Multiple tooth extractions.  No focal bone lesion. Electronically Signed   By: Burman NievesWilliam  Stevens M.D.   On: 03/15/2017 23:16   Dg Chest 2 View  Result Date: 02/26/2017 CLINICAL DATA:  Congestive heart failure EXAM: CHEST  2 VIEW COMPARISON:  02/23/2017 FINDINGS: Cardiac pacemaker. Heart size and pulmonary vascularity are normal. Coronary artery stents. Lungs are clear. No blunting of costophrenic angles. No pneumothorax. Mediastinal contours appear intact. Mild degenerative changes in the spine.  IMPRESSION: No active cardiopulmonary disease. Electronically Signed   By: Burman Nieves M.D.   On: 03/19/2017 23:18   Ct Chest Wo Contrast  Result Date: 02/25/2017 CLINICAL DATA:  Patient with worsening shortness of breath and abdominal swelling. EXAM: CT CHEST, ABDOMEN AND PELVIS WITHOUT CONTRAST TECHNIQUE: Multidetector CT imaging of the chest, abdomen and pelvis was performed following the standard protocol without IV contrast. COMPARISON:  Chest CT 10/22/2016.  Heart is enlarged. FINDINGS: CT CHEST FINDINGS Cardiovascular: Heart is enlarged.  No pericardial effusion. Mediastinum/Nodes: Similar-appearing prominent mediastinal lymph nodes measuring up to 9 mm (image 21; series 3) within the right peritracheal location. Small hiatal hernia. Normal esophagus. Lungs/Pleura: Patchy ground-glass opacities demonstrated within the left upper lobe (image 81; series 5). No pleural effusion or pneumothorax. Paraseptal emphysematous change. Musculoskeletal: Thoracic spine degenerative  changes. No aggressive or acute appearing osseous lesions. CT ABDOMEN PELVIS FINDINGS Hepatobiliary: The liver is normal in size and contour. Multiple hepatic cysts are demonstrated within the left and right hepatic lobes, unchanged. Gallbladder is surgically absent. Pancreas: Unremarkable Spleen: Unremarkable Adrenals/Urinary Tract: The adrenal glands are normal. Stable 13 mm partially exophytic low-attenuation lesion interpolar region left kidney. Urinary bladder is unremarkable. Stomach/Bowel: Normal morphology of the stomach. No evidence for bowel obstruction. No free fluid or free intraperitoneal air. Normal appendix. Vascular/Lymphatic: Normal caliber abdominal aorta. Peripheral calcified atherosclerotic plaque. No retroperitoneal lymphadenopathy. Reproductive: Prostate unremarkable. Other: Left fat containing inguinal hernia. Musculoskeletal: Lumbar spine degenerative changes. Progressed L5-S1 degenerative disc disease with Schmorl's nodes involving the S1 endplate. No aggressive or acute appearing osseous lesions. IMPRESSION: 1. Patchy ground-glass opacities within the left upper lobe may represent infectious or inflammatory process. Recommend follow-up chest CT in 3 months to assess for interval resolution. 2. Borderline mediastinal adenopathy, potentially reactive in etiology. Recommend attention on follow-up. 3. No acute process within the abdomen. Electronically Signed   By: Annia Belt M.D.   On: 02/25/2017 13:08   Ir Picc Placement Right >5 Yrs Inc Img Guide  Result Date: 02/25/2017 INDICATION: 63 year old with systolic congestive heart failure. Patient needs central line for milrinone infusion. EXAM: RIGHT ARM PICC LINE PLACEMENT WITH ULTRASOUND AND FLUOROSCOPIC GUIDANCE ATTEMPTED PLACEMENT OF LEFT IJ CENTRAL LINE WITH ULTRASOUND AND FLUOROSCOPIC GUIDANCE MEDICATIONS: None ANESTHESIA/SEDATION: None FLUOROSCOPY TIME:  Fluoroscopy Time: 3 minutes 42 seconds (14 mGy). COMPLICATIONS: None immediate.  PROCEDURE: The procedure was explained to the patient. The risks and benefits of the procedure were discussed and the patient's questions were addressed. Informed consent was obtained from the patient. Attempted to place a left IJ tunneled central line. Previous left upper extremity venogram demonstrated occlusion in the left subclavian vein but not clear if left innominate vein was patent. Small left internal jugular vein was identified on ultrasound. Left side of the neck was prepped and draped in sterile fashion. Maximal barrier sterile technique was utilized including caps, mask, sterile gowns, sterile gloves, sterile drape, hand hygiene and skin antiseptic. Skin was anesthetized with 1% lidocaine. 21 gauge needle directed into the left internal jugular vein with ultrasound guidance. A wire would not advance centrally towards the left innominate vein or SVC. Findings compatible with a chronic occlusion at this location. This access was removed. Attention was directed to placing a right arm PICC line because the patient currently has a right chest ICD. The right arm was prepped with chlorhexidine, draped in the usual sterile fashion using maximum barrier technique (cap and mask, sterile gown, sterile gloves, large sterile sheet, hand hygiene and cutaneous antiseptic). Local  anesthesia was attained by infiltration with 1% lidocaine. Ultrasound demonstrated patency of the right brachial vein, and this was documented with an image. Under real-time ultrasound guidance, this vein was accessed with a 21 gauge micropuncture needle and image documentation was performed. The needle was exchanged over a guidewire for a peel-away sheath through which a 36 cm 5 [place] dual lumen power injectable PICC was advanced, and positioned in the upper SVC. Fluoroscopy during the procedure and fluoro spot radiograph confirms appropriate catheter position. The catheter was flushed, secured to the skin, and covered with a sterile  dressing. IMPRESSION: Successful placement of a right arm PICC with sonographic and fluoroscopic guidance. The catheter is ready for use. 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 Electronically Signed   By: Richarda Overlie M.D.   On: 02/25/2017 17:05    Cardiac Studies      Device Interrogation        Assessment & Plan    CHB  Failed His lead placement  Acute chronic systolic heart failure c 4  Ischemic Cardiomyopathy  VTachycardia   Have appreciated efforts to clarify and now address low output heart failure  Will check ECG to look at effects of His bundle pacing  Will discuss with Dr DM re strategy and have reached out to [facility] again to garner data on His bundle lead removal and reimplant.      Signed, Sherryl Manges, MD  02/26/2017, 8:12 AM

## 2017-02-27 DIAGNOSIS — N170 Acute kidney failure with tubular necrosis: Secondary | ICD-10-CM

## 2017-02-27 DIAGNOSIS — N183 Chronic kidney disease, stage 3 (moderate): Secondary | ICD-10-CM

## 2017-02-27 DIAGNOSIS — I951 Orthostatic hypotension: Secondary | ICD-10-CM

## 2017-02-27 LAB — BASIC METABOLIC PANEL
Anion gap: 9 (ref 5–15)
BUN: 30 mg/dL — ABNORMAL HIGH (ref 6–20)
CALCIUM: 9.4 mg/dL (ref 8.9–10.3)
CHLORIDE: 94 mmol/L — AB (ref 101–111)
CO2: 29 mmol/L (ref 22–32)
Creatinine, Ser: 2.14 mg/dL — ABNORMAL HIGH (ref 0.61–1.24)
GFR calc non Af Amer: 31 mL/min — ABNORMAL LOW (ref >60)
GFR, EST AFRICAN AMERICAN: 36 mL/min — AB (ref >60)
Glucose, Bld: 101 mg/dL — ABNORMAL HIGH (ref 65–99)
Potassium: 4.4 mmol/L (ref 3.5–5.1)
SODIUM: 132 mmol/L — AB (ref 135–145)

## 2017-02-27 LAB — CBC WITH DIFFERENTIAL/PLATELET
BASOS PCT: 1 %
Basophils Absolute: 0.1 10*3/uL (ref 0.0–0.1)
EOS ABS: 0.3 10*3/uL (ref 0.0–0.7)
Eosinophils Relative: 4 %
HCT: 35.4 % — ABNORMAL LOW (ref 39.0–52.0)
Hemoglobin: 11.5 g/dL — ABNORMAL LOW (ref 13.0–17.0)
Lymphocytes Relative: 23 %
Lymphs Abs: 1.8 10*3/uL (ref 0.7–4.0)
MCH: 29.8 pg (ref 26.0–34.0)
MCHC: 32.5 g/dL (ref 30.0–36.0)
MCV: 91.7 fL (ref 78.0–100.0)
MONOS PCT: 11 %
Monocytes Absolute: 0.8 10*3/uL (ref 0.1–1.0)
NEUTROS PCT: 61 %
Neutro Abs: 4.8 10*3/uL (ref 1.7–7.7)
Platelets: 272 10*3/uL (ref 150–400)
RBC: 3.86 MIL/uL — ABNORMAL LOW (ref 4.22–5.81)
RDW: 14.5 % (ref 11.5–15.5)
WBC: 7.9 10*3/uL (ref 4.0–10.5)

## 2017-02-27 LAB — COOXEMETRY PANEL
Carboxyhemoglobin: 1.3 % (ref 0.5–1.5)
Methemoglobin: 1 % (ref 0.0–1.5)
O2 SAT: 69.6 %
TOTAL HEMOGLOBIN: 12.2 g/dL (ref 12.0–16.0)

## 2017-02-27 LAB — LUPUS ANTICOAGULANT PANEL
DRVVT: 50.3 s — AB (ref 0.0–47.0)
PTT Lupus Anticoagulant: 40.8 s (ref 0.0–51.9)

## 2017-02-27 LAB — DRVVT MIX: dRVVT Mix: 42.4 s (ref 0.0–47.0)

## 2017-02-27 LAB — HEPARIN LEVEL (UNFRACTIONATED): Heparin Unfractionated: 0.56 IU/mL (ref 0.30–0.70)

## 2017-02-27 MED ORDER — MILRINONE LACTATE IN DEXTROSE 20-5 MG/100ML-% IV SOLN: 0.1250 ug/kg/min | INTRAVENOUS | Status: DC

## 2017-02-27 MED ORDER — SODIUM CHLORIDE 0.9 % IV BOLUS (SEPSIS)
250.0000 mL | Freq: Once | INTRAVENOUS | Status: AC
  Administered 2017-02-27: 250 mL via INTRAVENOUS

## 2017-02-27 NOTE — Progress Notes (Signed)
ANTICOAGULATION CONSULT NOTE - Follow Up Consult  Pharmacy Consult for heparin Indication: LV thrombus  Allergies  Allergen Reactions  . Corlanor [Ivabradine] Diarrhea    Patient Measurements: Height: 5\' 11"  (180.3 cm) Weight: 217 lb 6 oz (98.6 kg) IBW/kg (Calculated) : 75.3 Heparin Dosing Weight: 95.9 kg   Vital Signs: Temp: 97.5 F (36.4 C) (01/05 0451) Temp Source: Oral (01/05 0451) BP: 93/49 (01/05 0451) Pulse Rate: 62 (01/05 0451)  Labs: Recent Labs    02/25/17 0433 02/26/17 0728 02/27/17 0418 02/27/17 0425  HGB 11.3* 11.3* 11.5*  --   HCT 34.2* 34.8* 35.4*  --   PLT 230 260 272  --   APTT 80*  --   --   --   LABPROT 13.7  --   --   --   INR 1.06  --   --   --   HEPARINUNFRC 0.54 0.60  --  0.56  CREATININE 1.85* 1.98* 2.14*  --     Estimated Creatinine Clearance: 42.3 mL/min (A) (by C-G formula based on SCr of 2.14 mg/dL (H)).   Medical History: Past Medical History:  Diagnosis Date  . Anxiety   . Automatic implantable cardioverter-defibrillator in situ    a. Medtronic ICD.  [name] CAD (coronary artery disease)    a.  Severe LAD stenosis 2/2 acute thrombus - BMS 2009;  b. 06/01/11 Cath - LAD 20 isr, 25m (3.0x16 Veri-flex BMS), OM1 100p, EF 20-25%;  c. 07/2012 Abnl Cardiolite;  d. 08/2012 Cath/PCI: LM nl, LAD patent stents, D2 80-90 (jailed), LCX 90 p/m (4.0x24 Promus Premier DES), OM1 100, OM2 80-90p (3.0x20 Promus Premier DES), RCA patent mid stent, 40d, EF 25%.  e. 10/29/15 LHC stable disease with patent stents  . Chronic systolic CHF (congestive heart failure), NYHA class 3 (HCC) - low output state 12/12/2014  . Depression   . Dyspnea   . History of rheumatic fever 1962  . HLD (hyperlipidemia) mixed  . HTN (hypertension)   . Ischemic cardiomyopathy    a.  EF 30-35% 2010;  b.  EF 20-25% by LV gram 08/2012. c. EF 15% by echo 11/2014.  [name] Myocardial infarction [facility]) 1998; 2002; ~ 2010  . Paroxysmal ventricular tachycardia (HCC)    a. VFlutter  CL 210 msec  Rx  shock 04/2011  . PVC's (premature ventricular contractions)   . RBBB   . TIA (transient ischemic attack)    a. Dx 11/2014, continued on ASA/Plavix.    Medications:  Scheduled:  . amiodarone  200 mg Oral BID  . aspirin EC  81 mg Oral Daily  . atorvastatin  40 mg Oral Daily  . digoxin  125 mcg Oral QODAY  . famotidine  20 mg Oral BID  . feeding supplement (ENSURE ENLIVE)  237 mL Oral Q1500  . furosemide  80 mg Intravenous BID  . magnesium oxide  400 mg Oral Daily  . mexiletine  200 mg Oral Q12H  . PARoxetine  20 mg Oral Daily  . potassium chloride  40 mEq Oral BID  . sildenafil  20 mg Oral TID  . sodium chloride flush  3 mL Intravenous Q12H  . spironolactone  12.5 mg Oral Daily  . tamsulosin  0.4 mg Oral Daily    Assessment: 61 yom admitted for increased SOB, abdominal swelling, and orthopnea with known history of CHF. Patient found to have suspected LV thrombus - pharmacy consulted to start heparin. Patient was not on anticoagulation PTA.   Heparin level therapeutic at 0.56  on 1550 units/hr. CBC stable. No signs/symptoms of bleeding documented.  Goal of Therapy:  Heparin level 0.3-0.7 units/ml Monitor platelets by anticoagulation protocol: Yes   Plan:  Continue heparin infusion at 1550 units/hr Check anti-Xa level daily while on heparin Continue to monitor H&H and platelets  Adline PotterSabrina Jaquelinne Glendening, PharmD Pharmacy Resident Pager: [phone]

## 2017-02-27 NOTE — Progress Notes (Signed)
Patient ID: [name], male   DOB: 23-Apr-1955, 63 y.o.   MRN: [id number]     Advanced Heart Failure Rounding Note  HF Cardiology: Shirlee Latch   Subjective:    Coox 70% on milrinone 0.25 mcg/kg/min this am.  Has RUE PICC (Unable to placed tunneled PICC with R ICD and occluded left subclavian)  Remains on IV lasix. Weight down another 2 pounds. Feels dizzy today was presyncopal walking to bathroom. Thinks it may be related to sildenafil. Creatinine up 1.98-> 2.14. Denies SOB   CVP 5-6. We checked orthostatics and SBP 90s-> 70s with standing.   On heparin gtt for suspected LV thrombus.   RHC 03/21/2017 RA mean 9 RV 79/12 PA 85/36, mean 59 PCWP mean 29 Oxygen saturations: PA 51% AO 98% Cardiac Output (Fick) 3.95  Cardiac Index (Fick) 1.8 PVR 7.6 WU PAPi 5  Echo (1/2): EF 20-25% with severely dilated LV, severe RV dilation with mild to moderately decreased systolic function, moderate MR, moderate AI. Suspect LV thrombus.   Objective:   Weight Range: 98.6 kg (217 lb 6 oz) Body mass index is 30.32 kg/m.   Vital Signs:   Temp:  [97.3 F (36.3 C)-98.4 F (36.9 C)] 97.5 F (36.4 C) (01/05 1300) Pulse Rate:  [62-84] 84 (01/05 1300) Resp:  [20] 20 (01/04 2051) BP: (93-146)/(49-74) 118/70 (01/05 1300) SpO2:  [95 %-98 %] 96 % (01/05 1300) Weight:  [98.6 kg (217 lb 6 oz)] 98.6 kg (217 lb 6 oz) (01/05 0415) Last BM Date: 02/25/17  Weight change: Filed Weights   02/25/17 0513 02/26/17 0500 02/27/17 0415  Weight: 98.9 kg (218 lb) 99.6 kg (219 lb 9.3 oz) 98.6 kg (217 lb 6 oz)    Intake/Output:   Intake/Output Summary (Last 24 hours) at 02/27/2017 1331 Last data filed at 02/27/2017 0752 Gross per 24 hour  Intake 1193.43 ml  Output 1095 ml  Net 98.43 ml     Physical Exam   General:  Sitting on side of bed No resp difficulty HEENT: normal Neck: supple. no JVD. Carotids 2+ bilat; no bruits. No lymphadenopathy or thryomegaly appreciated. Cor: PMI laterally displaced. Regular rate &  rhythm. No rubs, gallops or murmurs. Lungs: clear Abdomen: soft, nontender, nondistended. No hepatosplenomegaly. No bruits or masses. Good bowel sounds. Extremities: no cyanosis, clubbing, rash, edema RUE PICC Neuro: alert & orientedx3, cranial nerves grossly intact. moves all 4 extremities w/o difficulty. Affect pleasant   Telemetry   NSR with His pacing in 80s +PVCs, personally reviewed.   Labs    CBC Recent Labs    02/26/17 0728 02/27/17 0418  WBC 6.5 7.9  NEUTROABS 4.5 4.8  HGB 11.3* 11.5*  HCT 34.8* 35.4*  MCV 91.8 91.7  PLT 260 272   Basic Metabolic Panel Recent Labs    40/98/11 0433 02/26/17 0728 02/27/17 0418  NA 135 130* 132*  K 4.1 4.3 4.4  CL 100* 97* 94*  CO2 27 25 29   GLUCOSE 98 104* 101*  BUN 26* 29* 30*  CREATININE 1.85* 1.98* 2.14*  CALCIUM 9.1 9.3 9.4  MG 2.0  --   --    Liver Function Tests Recent Labs    02/25/17 0433  AST 29  ALT 25  ALKPHOS 58  BILITOT 1.1  PROT 7.1  ALBUMIN 3.6   No results for input(s): LIPASE, AMYLASE in the last 72 hours. Cardiac Enzymes No results for input(s): CKTOTAL, CKMB, CKMBINDEX, TROPONINI in the last 72 hours.  BNP: BNP (last 3 results) Recent Labs  10/07/16 2103 12-21-16 1331 12-21-16 1613  BNP 809.6* 953.4* 1,016.9*    ProBNP (last 3 results) No results for input(s): PROBNP in the last 8760 hours.   D-Dimer No results for input(s): DDIMER in the last 72 hours. Hemoglobin A1C Recent Labs    02/25/17 0433  HGBA1C 5.1   Fasting Lipid Panel Recent Labs    02/25/17 0433  CHOL 148  HDL 54  LDLCALC 83  TRIG 56  CHOLHDL 2.7   Thyroid Function Tests Recent Labs    02/25/17 0433  TSH 1.187  1.194    Other results:   Imaging   No results found.  Medications:     Scheduled Medications: . amiodarone  200 mg Oral BID  . aspirin EC  81 mg Oral Daily  . atorvastatin  40 mg Oral Daily  . digoxin  125 mcg Oral QODAY  . famotidine  20 mg Oral BID  . feeding supplement  (ENSURE ENLIVE)  237 mL Oral Q1500  . furosemide  80 mg Intravenous BID  . magnesium oxide  400 mg Oral Daily  . mexiletine  200 mg Oral Q12H  . PARoxetine  20 mg Oral Daily  . potassium chloride  40 mEq Oral BID  . sildenafil  20 mg Oral TID  . sodium chloride flush  3 mL Intravenous Q12H  . spironolactone  12.5 mg Oral Daily  . tamsulosin  0.4 mg Oral Daily    Infusions: . sodium chloride Stopped (02/25/17 1741)  . heparin 1,550 Units/hr (02/26/17 0028)  . milrinone 0.25 mcg/kg/min (02/26/17 2218)    PRN Medications: sodium chloride, acetaminophen, ALPRAZolam, docusate sodium, lidocaine-EPINEPHrine, ondansetron (ZOFRAN) IV, sodium chloride flush, traMADol, triamcinolone cream   Assessment/Plan   1. Acute on chronic systolic CHF: Ischemic cardiomyopathy. With low cardiac output noted on during admission in 10/17. Had upgrade to Medtronic BiV ICD in 10/17. However, with VT storm in 12/17, the LV lead was turned off due to concerns for pro-arrhythmia.  In 1/18, he developed a device infection and the BiV-ICD was removed and later replaced with a Medtronic ICD with His bundle pacing.  RHC (1/18) with preserved cardiac output.  TEE (1/18) with EF 15%.  CPX in 5/18 with moderate HF limitation.  Most recent echo in 02/23/2017 with EF 20-25%.  The RV was severely dilated but mild to moderately hypokinetic. Most recently, he lost capture of His bundle lead and AV delay was increased to limit RV pacing.  EP restarted His bundle pacing with maximal output to attain capture.  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 Low cardiac index 1.8 on RHC, milrinone begun (he was His pacing at time of RHC).  He was admitted from clinic with NYHA class IV symptoms.  - Coox 70% on Milrinone 0.25 mcg/kg/min. Will drop to 0.125 for now. Follow co-ox.  - CVP 5-6 today with marked orthostasis. Will give 250 NS back. Encourage po intake. Stop lasix. Will hold sildenafil  for now as well.  - Did not tolerate Entresto due to orthostasis, has been taken off losartan with elevated creatinine and low BP.  - Continue spironolactone 12.5 mg daily (did not tolerate increase due to abdominal discomfort).  - Continue digoxin 0.125 mg every other day. Level ok this admit.  - He is off bisoprolol, will leave off for now with low output.  - He did not tolerate Corlanor due to diarrhea.  - Concerned about his long-term prognosis at this point.  LV lead was pro-arrhythmic and he will likely lose His bundle capture in the near future.  Not sure replacing His lead will be enough to stave off LVAD placement.  Low output on RHC milrinone begun.  He would not be a good long-term milrinone candidate due to pro-arrhythmic effect.  We have discussed LVAD with him.  He will need some tuning prior to this, would like to see PA pressure down and full diuresis.  Hopefully creatinine will come down with inotropic support.  Will repeat RHC next week on milrinone, hopefully creatinine will be better.  Will begin LVAD workup => LVAD coordinators to see and has seen Dr. Donata Clay.  2.  CAD: LHC in 12/17 with stable coronary disease.  No CP.  - Continue ASA, Plavix, statin.  Good lipids 10/18. 3. Smoking: has stopped smoking since 2/18.  4. 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 Suspect this contributed to HF with loss of AV synchrony (and also was bradycardic). 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  He was BiV paced after 10/17 admission, but LV lead was turned off due to concern for pro-arrhythmia.  He now has a Medtronic ICD and had been His bundle pacing, but lost capture in His lead and was RV pacing prior to admission.   - See above discussion. EP has seen. His Bundle pacing turned to max output but this may not last long before capture lost fully. No change. 5. Ascending aortic aneurysm: CTA chest in 8/18 actually did not show a significant aneurysm. No change. 6.  Ventricular tachycardia: VT storm in 12/17, may have been related to LV pacing.  Milrinone gtt started, would not use long-term due to risk of proarrhythmia.  - Continue amiodarone and mexiletine. No VT/VF by device interrogation.  Amiodarone increased to 200 mg bid with milrinone use.  - Recent LFTs and TSH ok with amiodarone use. No change.  - Follow electrolytes closely. Keep K. 4.0 and Mg > 2.0  7. PTSD: Patient is on paroxetine due to significant anxiety around shocks.   - Stable. 8. Pulmonary HTN: Severe on RHC 03-13-17.  Mixed pulmonary venous/pulmonary arterial HTN.   - Plan on starting sildenafil 20 mg tid today.  As above, will eventually repeat RHC. No change. 9. AKI on CKD stage 3:  - Creatinine up slightly with diuresis. Will give back some fluid today.  - Continue to follow closely.  10. LV thrombus: Noted on echo.  - Remains on heparin gtt.   Length of Stay: 5  Arvilla Meres, MD  02/27/2017 1:31 PM  Advanced Heart Failure Team Pager [phone] (M-F; 7a - 4p)  Please contact [facility] for night-coverage after hours (4p -7a ) and weekends on [email or web address]

## 2017-02-28 DIAGNOSIS — I472 Ventricular tachycardia: Secondary | ICD-10-CM

## 2017-02-28 LAB — BASIC METABOLIC PANEL
ANION GAP: 6 (ref 5–15)
BUN: 28 mg/dL — ABNORMAL HIGH (ref 6–20)
CALCIUM: 9.4 mg/dL (ref 8.9–10.3)
CHLORIDE: 97 mmol/L — AB (ref 101–111)
CO2: 27 mmol/L (ref 22–32)
Creatinine, Ser: 2.1 mg/dL — ABNORMAL HIGH (ref 0.61–1.24)
GFR calc non Af Amer: 32 mL/min — ABNORMAL LOW (ref >60)
GFR, EST AFRICAN AMERICAN: 37 mL/min — AB (ref >60)
GLUCOSE: 98 mg/dL (ref 65–99)
POTASSIUM: 4.5 mmol/L (ref 3.5–5.1)
Sodium: 130 mmol/L — ABNORMAL LOW (ref 135–145)

## 2017-02-28 LAB — CBC WITH DIFFERENTIAL/PLATELET
Basophils Absolute: 0.1 10*3/uL (ref 0.0–0.1)
Basophils Relative: 1 %
Eosinophils Absolute: 0.4 10*3/uL (ref 0.0–0.7)
Eosinophils Relative: 5 %
HEMATOCRIT: 35 % — AB (ref 39.0–52.0)
HEMOGLOBIN: 11.1 g/dL — AB (ref 13.0–17.0)
LYMPHS PCT: 25 %
Lymphs Abs: 1.9 10*3/uL (ref 0.7–4.0)
MCH: 29.5 pg (ref 26.0–34.0)
MCHC: 31.7 g/dL (ref 30.0–36.0)
MCV: 93.1 fL (ref 78.0–100.0)
MONO ABS: 0.8 10*3/uL (ref 0.1–1.0)
Monocytes Relative: 10 %
NEUTROS ABS: 4.5 10*3/uL (ref 1.7–7.7)
NEUTROS PCT: 59 %
Platelets: 261 10*3/uL (ref 150–400)
RBC: 3.76 MIL/uL — ABNORMAL LOW (ref 4.22–5.81)
RDW: 14.6 % (ref 11.5–15.5)
WBC: 7.7 10*3/uL (ref 4.0–10.5)

## 2017-02-28 LAB — HEPARIN LEVEL (UNFRACTIONATED): HEPARIN UNFRACTIONATED: 0.42 [IU]/mL (ref 0.30–0.70)

## 2017-02-28 LAB — COOXEMETRY PANEL
CARBOXYHEMOGLOBIN: 1.1 % (ref 0.5–1.5)
METHEMOGLOBIN: 1.3 % (ref 0.0–1.5)
O2 Saturation: 56.1 %
Total hemoglobin: 11.7 g/dL — ABNORMAL LOW (ref 12.0–16.0)

## 2017-02-28 MED ORDER — FUROSEMIDE 80 MG PO TABS
80.0000 mg | ORAL_TABLET | Freq: Every day | ORAL | Status: DC
  Administered 2017-02-28 – 2017-03-01 (×2): 80 mg via ORAL
  Filled 2017-02-28 (×2): qty 1

## 2017-02-28 MED ORDER — MILRINONE LACTATE IN DEXTROSE 20-5 MG/100ML-% IV SOLN
0.3750 ug/kg/min | INTRAVENOUS | Status: DC
  Administered 2017-02-28 – 2017-03-01 (×3): 0.25 ug/kg/min via INTRAVENOUS
  Administered 2017-03-02: 0.375 ug/kg/min via INTRAVENOUS
  Administered 2017-03-02: 0.25 ug/kg/min via INTRAVENOUS
  Administered 2017-03-03 – 2017-03-09 (×15): 0.375 ug/kg/min via INTRAVENOUS
  Filled 2017-02-28 (×24): qty 100

## 2017-02-28 NOTE — Progress Notes (Addendum)
Patient ID: [name], male   DOB: 1955-11-23, 62 y.o.   MRN: [id number]     Advanced Heart Failure Rounding Note  HF Cardiology: Shirlee Latch   Subjective:    Coox 70% on milrinone 0.25 mcg/kg/min this am.  Has RUE PICC (Unable to placed tunneled PICC with R ICD and occluded left subclavian)  Was dry yesterday with CVP 5 and orthostasis (dropped SBP 95-> 70s with standing). Milrinone cut back. Given 250 NS and sildenafil held.   Feels better this am. No longer lightheaded. Denies CP or SOB. CVP 7. Very anxious about his HR as monitor keeps alarming with HRs in 150 (monitor double counting p-waves and intermittent extra spikes -> I changed lead on tele and it resolved)  Co-ox 56% Creatinine 2.14-> 2.10 (baseline 1.8-1.9)  On heparin gtt for suspected LV thrombus.   RHC 03/08/2017 RA mean 9 RV 79/12 PA 85/36, mean 59 PCWP mean 29 Oxygen saturations: PA 51% AO 98% Cardiac Output (Fick) 3.95  Cardiac Index (Fick) 1.8 PVR 7.6 WU PAPi 5  Echo (1/2): EF 20-25% with severely dilated LV, severe RV dilation with mild to moderately decreased systolic function, moderate MR, moderate AI. Suspect LV thrombus.   Objective:   Weight Range: 101.2 kg (223 lb 1.6 oz) Body mass index is 31.12 kg/m.   Vital Signs:   Temp:  [97.4 F (36.3 C)-98.1 F (36.7 C)] 97.4 F (36.3 C) (01/06 0821) Pulse Rate:  [73-86] 86 (01/06 0821) BP: (103-136)/(61-82) 136/82 (01/06 0821) SpO2:  [95 %-99 %] 98 % (01/06 0821) Weight:  [101.2 kg (223 lb 1.6 oz)] 101.2 kg (223 lb 1.6 oz) (01/06 0422) Last BM Date: 02/25/17  Weight change: Filed Weights   02/26/17 0500 02/27/17 0415 02/28/17 0422  Weight: 99.6 kg (219 lb 9.3 oz) 98.6 kg (217 lb 6 oz) 101.2 kg (223 lb 1.6 oz)    Intake/Output:   Intake/Output Summary (Last 24 hours) at 02/28/2017 1014 Last data filed at 02/28/2017 4098 Gross per 24 hour  Intake 811.7 ml  Output 2460 ml  Net -1648.3 ml     Physical Exam   General:  Well appearing. No resp  difficulty HEENT: normal Neck: supple. no JVD. Carotids 2+ bilat; no bruits. No lymphadenopathy or thryomegaly appreciated. Cor: PMI laterally displaced. Regular rate & rhythm. No rubs, gallops or murmurs. Lungs: clear Abdomen: soft, nontender, nondistended. No hepatosplenomegaly. No bruits or masses. Good bowel sounds. Extremities: no cyanosis, clubbing, rash, edema + tattoos. + RUE PICC Neuro: alert & orientedx3, cranial nerves grossly intact. moves all 4 extremities w/o difficulty. Affect pleasant  Telemetry   NSR with His pacing in 80-90s +occasioanl PVCs, - there are extra spikes intermittently throughout personally reviewed.   Labs    CBC Recent Labs    02/27/17 0418 02/28/17 0330  WBC 7.9 7.7  NEUTROABS 4.8 4.5  HGB 11.5* 11.1*  HCT 35.4* 35.0*  MCV 91.7 93.1  PLT 272 261   Basic Metabolic Panel Recent Labs    11/91/47 0418 02/28/17 0330  NA 132* 130*  K 4.4 4.5  CL 94* 97*  CO2 29 27  GLUCOSE 101* 98  BUN 30* 28*  CREATININE 2.14* 2.10*  CALCIUM 9.4 9.4   Liver Function Tests No results for input(s): AST, ALT, ALKPHOS, BILITOT, PROT, ALBUMIN in the last 72 hours. No results for input(s): LIPASE, AMYLASE in the last 72 hours. Cardiac Enzymes No results for input(s): CKTOTAL, CKMB, CKMBINDEX, TROPONINI in the last 72 hours.  BNP: BNP (last  3 results) Recent Labs    10/07/16 2103 03-20-2016 1331 03-20-2016 1613  BNP 809.6* 953.4* 1,016.9*    ProBNP (last 3 results) No results for input(s): PROBNP in the last 8760 hours.   D-Dimer No results for input(s): DDIMER in the last 72 hours. Hemoglobin A1C No results for input(s): HGBA1C in the last 72 hours. Fasting Lipid Panel No results for input(s): CHOL, HDL, LDLCALC, TRIG, CHOLHDL, LDLDIRECT in the last 72 hours. Thyroid Function Tests No results for input(s): TSH, T4TOTAL, T3FREE, THYROIDAB in the last 72 hours.  Invalid input(s): FREET3  Other results:   Imaging   No results  found.  Medications:     Scheduled Medications: . amiodarone  200 mg Oral BID  . aspirin EC  81 mg Oral Daily  . atorvastatin  40 mg Oral Daily  . digoxin  125 mcg Oral QODAY  . famotidine  20 mg Oral BID  . feeding supplement (ENSURE ENLIVE)  237 mL Oral Q1500  . magnesium oxide  400 mg Oral Daily  . mexiletine  200 mg Oral Q12H  . PARoxetine  20 mg Oral Daily  . potassium chloride  40 mEq Oral BID  . sodium chloride flush  3 mL Intravenous Q12H  . spironolactone  12.5 mg Oral Daily  . tamsulosin  0.4 mg Oral Daily    Infusions: . sodium chloride Stopped (02/25/17 1741)  . heparin 1,550 Units/hr (02/28/17 0310)  . milrinone 0.125 mcg/kg/min (02/27/17 1417)    PRN Medications: sodium chloride, acetaminophen, ALPRAZolam, docusate sodium, lidocaine-EPINEPHrine, ondansetron (ZOFRAN) IV, sodium chloride flush, traMADol, triamcinolone cream   Assessment/Plan   1. Acute on chronic systolic CHF: Ischemic cardiomyopathy. With low cardiac output noted on during admission in 10/17. Had upgrade to Medtronic BiV ICD in 10/17. However, with VT storm in 12/17, the LV lead was turned off due to concerns for pro-arrhythmia.  In 1/18, he developed a device infection and the BiV-ICD was removed and later replaced with a Medtronic ICD with His bundle pacing.  RHC (1/18) with preserved cardiac output.  TEE (1/18) with EF 15%.  CPX in 5/18 with moderate HF limitation.  Most recent echo in 03/08/2017 with EF 20-25%.  The RV was severely dilated but mild to moderately hypokinetic. Most recently, he lost capture of His bundle lead and AV delay was increased to limit RV pacing.  EP restarted His bundle pacing with maximal output to attain capture.  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 Low cardiac index 1.8 on RHC, milrinone begun (he was His pacing at time of RHC).  He was admitted from clinic with NYHA class IV symptoms.  - Coox 56% on Milrinone 0.125  mcg/kg/min. Milrinone cut back on 1/5 due to orthostasis. Will increase back to 0.25 - Diuretics stopped on 1/5 due to marked orthostasis. Gave 250 NS back. Now improved. Weight up 6 pounds. Start back oral diuretics - lasix 80 daily - Did not tolerate Entresto due to orthostasis, has been taken off losartan with elevated creatinine and low BP.  - Continue spironolactone 12.5 mg daily (did not tolerate increase due to abdominal discomfort).  - Continue digoxin 0.125 mg every other day. Level ok this admit.  - He is off bisoprolol, will leave off for now with low output.  - He did not tolerate Corlanor due to diarrhea.  - Sildenafil stopped at his request due to orthostasis - Concerned about his long-term prognosis at this point.  LV lead  was pro-arrhythmic and he will likely lose His bundle capture in the near future.  Not sure replacing His lead will be enough to stave off LVAD placement.  Low output on RHC milrinone begun.  He would not be a good long-term milrinone candidate due to pro-arrhythmic effect.  We have discussed LVAD with him.  He will need some tuning prior to this, would like to see PA pressure down and full diuresis.  Hopefully creatinine will come down with inotropic support.  Dr. Shirlee Latch planning to repeat RHC this week on milrinone, hopefully creatinine will be better. LVAD workup in process => LVAD coordinators to see and has seen Dr. Donata Clay.  2.  CAD: LHC in 12/17 with stable coronary disease.  No CP.  - Continue ASA, Plavix, statin.  Good lipids 10/18. 3. Smoking: has stopped smoking since 2/18.  4. 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 Suspect this contributed to HF with loss of AV synchrony (and also was bradycardic). 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  He was BiV paced after 10/17 admission, but LV lead was turned off due to concern for pro-arrhythmia.  He now has a Medtronic ICD and had been His bundle pacing, but lost capture in His lead and was  RV pacing prior to admission.   - See above discussion. EP has seen. His Bundle pacing turned to max output but this may not last long before capture lost fully. No change. - He is having extra spikes on his tele monitor. Will check ECG. Will likely need EP to re-evaluate  5. Ascending aortic aneurysm: CTA chest in 8/18 actually did not show a significant aneurysm. No change. 6. Ventricular tachycardia: VT storm in 12/17, may have been related to LV pacing.  Milrinone gtt started, would not use long-term due to risk of proarrhythmia.  - Continue amiodarone and mexiletine. No VT/VF by device interrogation.  Amiodarone increased to 200 mg bid with milrinone use.  - Recent LFTs and TSH ok with amiodarone use. No change.  - Follow electrolytes closely. Keep K. 4.0 and Mg > 2.0  7. PTSD: Patient is on paroxetine due to significant anxiety around shocks.   - Stable. 8. Pulmonary HTN: Severe on RHC 03/04/2017.  Mixed pulmonary venous/pulmonary arterial HTN.   - Started on sildenafil 20 mg tid on cath this admit. Held at patient's request due to orthostasis. Can consider rechallenge if PA pressures still high on milrinone.  As above, will eventually repeat RHC. No change. 9. AKI on CKD stage 3:  - Creatinine up slightly with diuresis yesterday. Stable today - Continue to follow closely.  10. LV thrombus: Noted on echo.  - Remains on heparin gtt.   Length of Stay: 6  Arvilla Meres, MD  02/28/2017 10:14 AM  Advanced Heart Failure Team Pager [phone] (M-F; 7a - 4p)  Please contact [facility] for night-coverage after hours (4p -7a ) and weekends on [email or web address]

## 2017-02-28 NOTE — Progress Notes (Signed)
ANTICOAGULATION CONSULT NOTE - Follow Up Consult  Pharmacy Consult for heparin Indication: LV thrombus  Allergies  Allergen Reactions  . Corlanor [Ivabradine] Diarrhea    Patient Measurements: Height: 5\' 11"  (180.3 cm) Weight: 223 lb 1.6 oz (101.2 kg) IBW/kg (Calculated) : 75.3 Heparin Dosing Weight: 95.9 kg   Vital Signs: Temp: 97.5 F (36.4 C) (01/06 0422) Temp Source: Oral (01/06 0422) BP: 103/61 (01/06 0422) Pulse Rate: 79 (01/06 0422)  Labs: Recent Labs    02/26/17 0728 02/27/17 0418 02/27/17 0425 02/28/17 0330 02/28/17 0611  HGB 11.3* 11.5*  --  11.1*  --   HCT 34.8* 35.4*  --  35.0*  --   PLT 260 272  --  261  --   HEPARINUNFRC 0.60  --  0.56  --  0.42  CREATININE 1.98* 2.14*  --  2.10*  --     Estimated Creatinine Clearance: 43.6 mL/min (A) (by C-G formula based on SCr of 2.1 mg/dL (H)).   Medical History: Past Medical History:  Diagnosis Date  . Anxiety   . Automatic implantable cardioverter-defibrillator in situ    a. Medtronic ICD.  [name] CAD (coronary artery disease)    a.  Severe LAD stenosis 2/2 acute thrombus - BMS 2009;  b. 06/01/11 Cath - LAD 20 isr, 65m (3.0x16 Veri-flex BMS), OM1 100p, EF 20-25%;  c. 07/2012 Abnl Cardiolite;  d. 08/2012 Cath/PCI: LM nl, LAD patent stents, D2 80-90 (jailed), LCX 90 p/m (4.0x24 Promus Premier DES), OM1 100, OM2 80-90p (3.0x20 Promus Premier DES), RCA patent mid stent, 40d, EF 25%.  e. 10/29/15 LHC stable disease with patent stents  . Chronic systolic CHF (congestive heart failure), NYHA class 3 (HCC) - low output state 12/12/2014  . Depression   . Dyspnea   . History of rheumatic fever 1962  . HLD (hyperlipidemia) mixed  . HTN (hypertension)   . Ischemic cardiomyopathy    a.  EF 30-35% 2010;  b.  EF 20-25% by LV gram 08/2012. c. EF 15% by echo 11/2014.  [name] Myocardial infarction [facility]) 1998; 2002; ~ 2010  . Paroxysmal ventricular tachycardia (HCC)    a. VFlutter  CL 210 msec  Rx shock 04/2011  . PVC's (premature  ventricular contractions)   . RBBB   . TIA (transient ischemic attack)    a. Dx 11/2014, continued on ASA/Plavix.    Medications:  Scheduled:  . amiodarone  200 mg Oral BID  . aspirin EC  81 mg Oral Daily  . atorvastatin  40 mg Oral Daily  . digoxin  125 mcg Oral QODAY  . famotidine  20 mg Oral BID  . feeding supplement (ENSURE ENLIVE)  237 mL Oral Q1500  . magnesium oxide  400 mg Oral Daily  . mexiletine  200 mg Oral Q12H  . PARoxetine  20 mg Oral Daily  . potassium chloride  40 mEq Oral BID  . sodium chloride flush  3 mL Intravenous Q12H  . spironolactone  12.5 mg Oral Daily  . tamsulosin  0.4 mg Oral Daily    Assessment: 61 yom admitted for increased SOB, abdominal swelling, and orthopnea with known history of CHF. Patient found to have suspected LV thrombus - pharmacy consulted to start heparin. Patient was not on anticoagulation PTA.   Heparin level therapeutic at 0.42 on 1550 units/hr. CBC stable. No signs/symptoms of bleeding documented.  Goal of Therapy:  Heparin level 0.3-0.7 units/ml Monitor platelets by anticoagulation protocol: Yes   Plan:  Continue heparin infusion at 1550 units/hr  Check anti-Xa level daily while on heparin Continue to monitor H&H and platelets  [name], PharmD Pharmacy Resident Pager: [phone]

## 2017-02-28 NOTE — Plan of Care (Signed)
Pain Managment: General experience of comfort will improve. Denied cp and general pain this shift. VS stable. Will continue to monitor and assess pt.  Safety: Ability to remain free from injury will improve. No falls or other injuries this shift. Will continue to implement safety measures.  02/28/2017 0437 - Progressing by Mellody DrownKuzmick, Granger Chui, RN

## 2017-03-01 DIAGNOSIS — I442 Atrioventricular block, complete: Secondary | ICD-10-CM

## 2017-03-01 LAB — URINALYSIS, ROUTINE W REFLEX MICROSCOPIC
Bilirubin Urine: NEGATIVE
Glucose, UA: NEGATIVE mg/dL
Hgb urine dipstick: NEGATIVE
Ketones, ur: NEGATIVE mg/dL
Leukocytes, UA: NEGATIVE
Nitrite: NEGATIVE
Protein, ur: NEGATIVE mg/dL
Specific Gravity, Urine: 1.006 (ref 1.005–1.030)
pH: 5 (ref 5.0–8.0)

## 2017-03-01 LAB — BASIC METABOLIC PANEL
Anion gap: 8 (ref 5–15)
BUN: 29 mg/dL — ABNORMAL HIGH (ref 6–20)
CO2: 27 mmol/L (ref 22–32)
CREATININE: 1.9 mg/dL — AB (ref 0.61–1.24)
Calcium: 9.3 mg/dL (ref 8.9–10.3)
Chloride: 97 mmol/L — ABNORMAL LOW (ref 101–111)
GFR calc Af Amer: 42 mL/min — ABNORMAL LOW (ref >60)
GFR calc non Af Amer: 36 mL/min — ABNORMAL LOW (ref >60)
GLUCOSE: 103 mg/dL — AB (ref 65–99)
Potassium: 4.5 mmol/L (ref 3.5–5.1)
Sodium: 132 mmol/L — ABNORMAL LOW (ref 135–145)

## 2017-03-01 LAB — COOXEMETRY PANEL
CARBOXYHEMOGLOBIN: 1.2 % (ref 0.5–1.5)
Methemoglobin: 1.3 % (ref 0.0–1.5)
O2 Saturation: 67.3 %
Total hemoglobin: 11.2 g/dL — ABNORMAL LOW (ref 12.0–16.0)

## 2017-03-01 LAB — FACTOR 5 LEIDEN

## 2017-03-01 LAB — HEPARIN LEVEL (UNFRACTIONATED): Heparin Unfractionated: 0.5 IU/mL (ref 0.30–0.70)

## 2017-03-01 MED ORDER — SODIUM CHLORIDE 0.9% FLUSH: 3.0000 mL | INTRAVENOUS | Status: DC | PRN

## 2017-03-01 MED ORDER — SODIUM CHLORIDE 0.9 % IV SOLN: 250.0000 mL | INTRAVENOUS | Status: DC | PRN

## 2017-03-01 MED ORDER — SODIUM CHLORIDE 0.9% FLUSH
3.0000 mL | Freq: Two times a day (BID) | INTRAVENOUS | Status: DC
  Administered 2017-03-01: 3 mL via INTRAVENOUS

## 2017-03-01 MED ORDER — SODIUM CHLORIDE 0.9 % IV SOLN: INTRAVENOUS | Status: DC

## 2017-03-01 MED ORDER — HYDROCORTISONE 1 % EX CREA
TOPICAL_CREAM | Freq: Three times a day (TID) | CUTANEOUS | Status: DC
  Administered 2017-03-01 – 2017-03-08 (×15): via TOPICAL
  Filled 2017-03-01: qty 28

## 2017-03-01 MED ORDER — ASPIRIN 81 MG PO CHEW
81.0000 mg | CHEWABLE_TABLET | ORAL | Status: AC
  Administered 2017-03-02: 81 mg via ORAL
  Filled 2017-03-01: qty 1

## 2017-03-01 NOTE — H&P (View-Only) (Signed)
Patient ID: [name], male   DOB: 11/01/1955, 62 y.o.   MRN: [id number]     Advanced Heart Failure Rounding Note  HF Cardiology: [name]   Subjective:     Has RUE PICC (Unable to placed tunneled PICC with R ICD and occluded left subclavian)  CO-OX 67%. On milrinone 0.25 mcg. On heparin gtt for suspected LV thrombus. CVP 4.   Denies SOB/Orthopnea.   RHC 03/19/2017 RA mean 9 RV 79/12 PA 85/36, mean 59 PCWP mean 29 Oxygen saturations: PA 51% AO 98% Cardiac Output (Fick) 3.95  Cardiac Index (Fick) 1.8 PVR 7.6 WU PAPi 5  Echo (1/2): EF 20-25% with severely dilated LV, severe RV dilation with mild to moderately decreased systolic function, moderate MR, moderate AI. Suspect LV thrombus.   Objective:   Weight Range: 221 lb 11.2 oz (100.6 kg) Body mass index is 30.92 kg/m.   Vital Signs:   Temp:  [97.4 F (36.3 C)-98.2 F (36.8 C)] 98.2 F (36.8 C) (01/07 0830) Pulse Rate:  [70-76] 72 (01/07 0838) Resp:  [20] 20 (01/07 0508) BP: (102-131)/(64-81) 102/64 (01/07 0830) SpO2:  [98 %-100 %] 99 % (01/07 0830) Weight:  [221 lb 11.2 oz (100.6 kg)] 221 lb 11.2 oz (100.6 kg) (01/07 0508) Last BM Date: 02/27/17  Weight change: Filed Weights   02/27/17 0415 02/28/17 0422 03/01/17 0508  Weight: 217 lb 6 oz (98.6 kg) 223 lb 1.6 oz (101.2 kg) 221 lb 11.2 oz (100.6 kg)    Intake/Output:   Intake/Output Summary (Last 24 hours) at 03/01/2017 1043 Last data filed at 03/01/2017 0832 Gross per 24 hour  Intake 1603.85 ml  Output 3375 ml  Net -1771.15 ml     Physical Exam  CVP 4  General:  Well appearing. No resp difficulty. Sitting in the chair.  HEENT: normal Neck: supple. no JVD. Carotids 2+ bilat; no bruits. No lymphadenopathy or thryomegaly appreciated. Cor: PMI laterally displaced. Regular rate & rhythm. No rubs, or murmurs. S3+ Lungs: clear Abdomen: soft, nontender, nondistended. No hepatosplenomegaly. No bruits or masses. Good bowel sounds. Extremities: no cyanosis, clubbing,  rash, edema + tattoos. + RUE PICC Neuro: alert & orientedx3, cranial nerves grossly intact. moves all 4 extremities w/o difficulty. Affect pleasant  Telemetry   NSR BiV 70s personally checked   Labs    CBC Recent Labs    02/27/17 0418 02/28/17 0330  WBC 7.9 7.7  NEUTROABS 4.8 4.5  HGB 11.5* 11.1*  HCT 35.4* 35.0*  MCV 91.7 93.1  PLT 272 261   Basic Metabolic Panel Recent Labs    56/21/2999/08/11 0330 03/01/17 0430  NA 130* 132*  K 4.5 4.5  CL 97* 97*  CO2 27 27  GLUCOSE 98 103*  BUN 28* 29*  CREATININE 2.10* 1.90*  CALCIUM 9.4 9.3   Liver Function Tests No results for input(s): AST, ALT, ALKPHOS, BILITOT, PROT, ALBUMIN in the last 72 hours. No results for input(s): LIPASE, AMYLASE in the last 72 hours. Cardiac Enzymes No results for input(s): CKTOTAL, CKMB, CKMBINDEX, TROPONINI in the last 72 hours.  BNP: BNP (last 3 results) Recent Labs    10/07/16 2103 01/29/2017 1331 02/02/2017 1613  BNP 809.6* 953.4* 1,016.9*    ProBNP (last 3 results) No results for input(s): PROBNP in the last 8760 hours.   D-Dimer No results for input(s): DDIMER in the last 72 hours. Hemoglobin A1C No results for input(s): HGBA1C in the last 72 hours. Fasting Lipid Panel No results for input(s): CHOL, HDL, LDLCALC, TRIG, CHOLHDL,  LDLDIRECT in the last 72 hours. Thyroid Function Tests No results for input(s): TSH, T4TOTAL, T3FREE, THYROIDAB in the last 72 hours.  Invalid input(s): FREET3  Other results:   Imaging   No results found.  Medications:     Scheduled Medications: . amiodarone  200 mg Oral BID  . aspirin EC  81 mg Oral Daily  . atorvastatin  40 mg Oral Daily  . digoxin  125 mcg Oral QODAY  . famotidine  20 mg Oral BID  . feeding supplement (ENSURE ENLIVE)  237 mL Oral Q1500  . furosemide  80 mg Oral Daily  . magnesium oxide  400 mg Oral Daily  . mexiletine  200 mg Oral Q12H  . PARoxetine  20 mg Oral Daily  . potassium chloride  40 mEq Oral BID  . sodium  chloride flush  3 mL Intravenous Q12H  . spironolactone  12.5 mg Oral Daily  . tamsulosin  0.4 mg Oral Daily    Infusions: . sodium chloride Stopped (02/25/17 1741)  . heparin 1,550 Units/hr (02/28/17 1841)  . milrinone 0.25 mcg/kg/min (03/01/17 0623)    PRN Medications: sodium chloride, acetaminophen, ALPRAZolam, docusate sodium, lidocaine-EPINEPHrine, ondansetron (ZOFRAN) IV, sodium chloride flush, traMADol, triamcinolone cream   Assessment/Plan   1. Acute on chronic systolic CHF: Ischemic cardiomyopathy. With low cardiac output noted on during admission in 10/17. Had upgrade to Medtronic BiV ICD in 10/17. However, with VT storm in 12/17, the LV lead was turned off due to concerns for pro-arrhythmia.  In 1/18, he developed a device infection and the BiV-ICD was removed and later replaced with a Medtronic ICD with His bundle pacing.  RHC (1/18) with preserved cardiac output.  TEE (1/18) with EF 15%.  CPX in 5/18 with moderate HF limitation.  Most recent echo in 03/20/2017 with EF 20-25%.  The RV was severely dilated but mild to moderately hypokinetic. Most recently, he lost capture of His bundle lead and AV delay was increased to limit RV pacing.  EP restarted His bundle pacing with maximal output to attain capture.  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 Low cardiac index 1.8 on RHC, milrinone begun (he was His pacing at time of RHC).  He was admitted from clinic with NYHA class IV symptoms.  - Attempted milrinone wean over weekend with fall in co-ox to 56%.  Increased back to 0.25, co-ox improved to 67%. Continue milrinone 0.25 mcg. - Diuretics stopped on 1/5 due to marked orthostasis. Gave 250 NS back.  - CVP 4. Volume status stable. pounds. Continue  lasix 80 daily.  - Did not tolerate Entresto due to orthostasis, has been taken off losartan with elevated creatinine and low BP.  - Continue spironolactone 12.5 mg daily (did not tolerate  increase due to abdominal discomfort).  - Continue digoxin 0.125 mg every other day. Level ok this admit.  - He is off bisoprolol, will leave off for now with low output.  - He did not tolerate Corlanor due to diarrhea.  - Sildenafil stopped at his request due to orthostasis - Concerned about his long-term prognosis at this point.  LV lead was pro-arrhythmic and he will likely lose His bundle capture in the near future.  Not sure replacing His lead will be enough to stave off LVAD placement.  Low output on RHC milrinone begun.  He would not be a good long-term milrinone candidate due to pro-arrhythmic effect.  We have discussed LVAD with him.  He will  need some tuning prior to this, would like to see PA pressure down and full diuresis.  Hopefully creatinine will come down with inotropic support.   Dr. Shirlee Latch planning to repeat RHC this week on milrinone, hopefully creatinine will be better. LVAD workup in process => LVAD coordinators to see and has seen Dr. Donata Clay.  HFSW evaluating today.  2.  CAD: LHC in 12/17 with stable coronary disease.   - No s/s ischemia. No CP.  - Continue ASA, Plavix, statin.  Good lipids 10/18. 3. Smoking: has stopped smoking since 2/18.  4. 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 Suspect this contributed to HF with loss of AV synchrony (and also was bradycardic). 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  He was BiV paced after 10/17 admission, but LV lead was turned off due to concern for pro-arrhythmia.  He now has a Medtronic ICD and had been His bundle pacing, but lost capture in His lead and was RV pacing prior to admission.   - See above discussion. EP has seen. His Bundle pacing turned to max output but this may not last long before capture lost fully. No change. - EP to evaluate today.   5. Ascending aortic aneurysm: CTA chest in 8/18 actually did not show a significant aneurysm. No change. 6. Ventricular tachycardia: VT storm in 12/17, may  have been related to LV pacing.  Milrinone gtt started, would not use long-term due to risk of proarrhythmia.  - Continue amiodarone and mexiletine. No VT/VF by device interrogation.   Continue amio 200 mg bid with milrinone use.  - Recent LFTs and TSH ok with amiodarone use. No change.  - Follow electrolytes closely. Keep K. 4.0 and Mg > 2.0  7. PTSD: Patient is on paroxetine due to significant anxiety around shocks.   - Stable. 8. Pulmonary HTN: Severe on RHC 03/12/2017.  Mixed pulmonary venous/pulmonary arterial HTN.  - Continue sildenafil 20 mg tid on cath this admit. Held at patient's request due to orthostasis. Can consider rechallenge if PA pressures still high on milrinone.  As above, will eventually repeat RHC. No change. 9. AKI on CKD stage 3:  - Creatinine 1.9 . Stable.  - Continue to follow closely.  10. LV thrombus: Noted on echo.  - Remains on heparin gtt.   Work up underway for LVAD.   Length of Stay: 7  Amy Clegg, NP  03/01/2017 10:43 AM  Advanced Heart Failure Team Pager [phone] (M-F; 7a - 4p)  Please contact [facility] for night-coverage after hours (4p -7a ) and weekends on [email or web address]  Patient seen with NP, agree with the above note.  Hemodynamics good today, creatinine trending down at 1.9. CVP 4.   - Continue current milrinone (failed wean) and po Lasix.   - Want to avoid long-term milrinone due to pro-arrhythmia risk in patient with history of VT storm/PTSD from this.  - Workup for LVAD ongoing.  Will discuss at [facility] today.  Will plan repeat RHC tomorrow on milrinone to reassess PA pressure, cardiac output. Procedure risks/benefits discussed with patient and he agrees.   Concern over weekend for possible loss of capture by pacemaker. EP to see today.   Marca Ancona 03/01/2017 12:30 PM

## 2017-03-01 NOTE — Progress Notes (Signed)
ANTICOAGULATION CONSULT NOTE - Follow Up Consult  Pharmacy Consult for heparin Indication: LV thrombus  Allergies  Allergen Reactions  . Corlanor [Ivabradine] Diarrhea    Patient Measurements: Height: 5\' 11"  (180.3 cm) Weight: 221 lb 11.2 oz (100.6 kg) IBW/kg (Calculated) : 75.3 Heparin Dosing Weight: 95.9 kg   Vital Signs: Temp: 98.2 F (36.8 C) (01/07 0830) Temp Source: Oral (01/07 0830) BP: 102/64 (01/07 0830) Pulse Rate: 72 (01/07 0838)  Labs: Recent Labs    02/27/17 0418 02/27/17 0425 02/28/17 0330 02/28/17 0611 03/01/17 0430 03/01/17 0842  HGB 11.5*  --  11.1*  --   --   --   HCT 35.4*  --  35.0*  --   --   --   PLT 272  --  261  --   --   --   HEPARINUNFRC  --  0.56  --  0.42  --  0.50  CREATININE 2.14*  --  2.10*  --  1.90*  --     Estimated Creatinine Clearance: 48.1 mL/min (A) (by C-G formula based on SCr of 1.9 mg/dL (H)).   Medical History: Past Medical History:  Diagnosis Date  . Anxiety   . Automatic implantable cardioverter-defibrillator in situ    a. Medtronic ICD.  [name] CAD (coronary artery disease)    a.  Severe LAD stenosis 2/2 acute thrombus - BMS 2009;  b. 06/01/11 Cath - LAD 20 isr, 45m (3.0x16 Veri-flex BMS), OM1 100p, EF 20-25%;  c. 07/2012 Abnl Cardiolite;  d. 08/2012 Cath/PCI: LM nl, LAD patent stents, D2 80-90 (jailed), LCX 90 p/m (4.0x24 Promus Premier DES), OM1 100, OM2 80-90p (3.0x20 Promus Premier DES), RCA patent mid stent, 40d, EF 25%.  e. 10/29/15 LHC stable disease with patent stents  . Chronic systolic CHF (congestive heart failure), NYHA class 3 (HCC) - low output state 12/12/2014  . Depression   . Dyspnea   . History of rheumatic fever 1962  . HLD (hyperlipidemia) mixed  . HTN (hypertension)   . Ischemic cardiomyopathy    a.  EF 30-35% 2010;  b.  EF 20-25% by LV gram 08/2012. c. EF 15% by echo 11/2014.  [name] Myocardial infarction [facility]) 1998; 2002; ~ 2010  . Paroxysmal ventricular tachycardia (HCC)    a. VFlutter  CL 210 msec  Rx  shock 04/2011  . PVC's (premature ventricular contractions)   . RBBB   . TIA (transient ischemic attack)    a. Dx 11/2014, continued on ASA/Plavix.    Medications:  Scheduled:  . amiodarone  200 mg Oral BID  . aspirin EC  81 mg Oral Daily  . atorvastatin  40 mg Oral Daily  . digoxin  125 mcg Oral QODAY  . famotidine  20 mg Oral BID  . feeding supplement (ENSURE ENLIVE)  237 mL Oral Q1500  . furosemide  80 mg Oral Daily  . magnesium oxide  400 mg Oral Daily  . mexiletine  200 mg Oral Q12H  . PARoxetine  20 mg Oral Daily  . potassium chloride  40 mEq Oral BID  . sodium chloride flush  3 mL Intravenous Q12H  . spironolactone  12.5 mg Oral Daily  . tamsulosin  0.4 mg Oral Daily    Assessment: 61 yom admitted for increased SOB, abdominal swelling, and orthopnea with known history of CHF. Patient found to have suspected LV thrombus - pharmacy consulted to start heparin. Patient was not on anticoagulation PTA.   Heparin level therapeutic at 0.5 on 1550 units/hr. CBC  stable, not done this morning. No signs/symptoms of bleeding documented.  Goal of Therapy:  Heparin level 0.3-0.7 units/ml Monitor platelets by anticoagulation protocol: Yes   Plan:  Continue heparin infusion at 1550 units/hr Check anti-Xa level daily while on heparin Continue to monitor H&H and platelets  Sheppard CoilFrank Wilson PharmD., BCPS Clinical Pharmacist Pager [phone] 03/01/2017 10:49 AM

## 2017-03-01 NOTE — Progress Notes (Signed)
Patient ID: [name], male   DOB: 05/06/1955, 61 y.o.   MRN: [id number]     Advanced Heart Failure Rounding Note  HF Cardiology: Catalyna Reilly   Subjective:     Has RUE PICC (Unable to placed tunneled PICC with R ICD and occluded left subclavian)  CO-OX 67%. On milrinone 0.25 mcg. On heparin gtt for suspected LV thrombus. CVP 4.   Denies SOB/Orthopnea.   RHC 03/08/2017 RA mean 9 RV 79/12 PA 85/36, mean 59 PCWP mean 29 Oxygen saturations: PA 51% AO 98% Cardiac Output (Fick) 3.95  Cardiac Index (Fick) 1.8 PVR 7.6 WU PAPi 5  Echo (1/2): EF 20-25% with severely dilated LV, severe RV dilation with mild to moderately decreased systolic function, moderate MR, moderate AI. Suspect LV thrombus.   Objective:   Weight Range: 221 lb 11.2 oz (100.6 kg) Body mass index is 30.92 kg/m.   Vital Signs:   Temp:  [97.4 F (36.3 C)-98.2 F (36.8 C)] 98.2 F (36.8 C) (01/07 0830) Pulse Rate:  [70-76] 72 (01/07 0838) Resp:  [20] 20 (01/07 0508) BP: (102-131)/(64-81) 102/64 (01/07 0830) SpO2:  [98 %-100 %] 99 % (01/07 0830) Weight:  [221 lb 11.2 oz (100.6 kg)] 221 lb 11.2 oz (100.6 kg) (01/07 0508) Last BM Date: 02/27/17  Weight change: Filed Weights   02/27/17 0415 02/28/17 0422 03/01/17 0508  Weight: 217 lb 6 oz (98.6 kg) 223 lb 1.6 oz (101.2 kg) 221 lb 11.2 oz (100.6 kg)    Intake/Output:   Intake/Output Summary (Last 24 hours) at 03/01/2017 1043 Last data filed at 03/01/2017 0832 Gross per 24 hour  Intake 1603.85 ml  Output 3375 ml  Net -1771.15 ml     Physical Exam  CVP 4  General:  Well appearing. No resp difficulty. Sitting in the chair.  HEENT: normal Neck: supple. no JVD. Carotids 2+ bilat; no bruits. No lymphadenopathy or thryomegaly appreciated. Cor: PMI laterally displaced. Regular rate & rhythm. No rubs, or murmurs. S3+ Lungs: clear Abdomen: soft, nontender, nondistended. No hepatosplenomegaly. No bruits or masses. Good bowel sounds. Extremities: no cyanosis, clubbing,  rash, edema + tattoos. + RUE PICC Neuro: alert & orientedx3, cranial nerves grossly intact. moves all 4 extremities w/o difficulty. Affect pleasant  Telemetry   NSR BiV 70s personally checked   Labs    CBC Recent Labs    02/27/17 0418 02/28/17 0330  WBC 7.9 7.7  NEUTROABS 4.8 4.5  HGB 11.5* 11.1*  HCT 35.4* 35.0*  MCV 91.7 93.1  PLT 272 261   Basic Metabolic Panel Recent Labs    02/28/17 0330 03/01/17 0430  NA 130* 132*  K 4.5 4.5  CL 97* 97*  CO2 27 27  GLUCOSE 98 103*  BUN 28* 29*  CREATININE 2.10* 1.90*  CALCIUM 9.4 9.3   Liver Function Tests No results for input(s): AST, ALT, ALKPHOS, BILITOT, PROT, ALBUMIN in the last 72 hours. No results for input(s): LIPASE, AMYLASE in the last 72 hours. Cardiac Enzymes No results for input(s): CKTOTAL, CKMB, CKMBINDEX, TROPONINI in the last 72 hours.  BNP: BNP (last 3 results) Recent Labs    10/07/16 2103 01/31/2017 1331 02/20/2017 1613  BNP 809.6* 953.4* 1,016.9*    ProBNP (last 3 results) No results for input(s): PROBNP in the last 8760 hours.   D-Dimer No results for input(s): DDIMER in the last 72 hours. Hemoglobin A1C No results for input(s): HGBA1C in the last 72 hours. Fasting Lipid Panel No results for input(s): CHOL, HDL, LDLCALC, TRIG, CHOLHDL,   LDLDIRECT in the last 72 hours. Thyroid Function Tests No results for input(s): TSH, T4TOTAL, T3FREE, THYROIDAB in the last 72 hours.  Invalid input(s): FREET3  Other results:   Imaging   No results found.  Medications:     Scheduled Medications: . amiodarone  200 mg Oral BID  . aspirin EC  81 mg Oral Daily  . atorvastatin  40 mg Oral Daily  . digoxin  125 mcg Oral QODAY  . famotidine  20 mg Oral BID  . feeding supplement (ENSURE ENLIVE)  237 mL Oral Q1500  . furosemide  80 mg Oral Daily  . magnesium oxide  400 mg Oral Daily  . mexiletine  200 mg Oral Q12H  . PARoxetine  20 mg Oral Daily  . potassium chloride  40 mEq Oral BID  . sodium  chloride flush  3 mL Intravenous Q12H  . spironolactone  12.5 mg Oral Daily  . tamsulosin  0.4 mg Oral Daily    Infusions: . sodium chloride Stopped (02/25/17 1741)  . heparin 1,550 Units/hr (02/28/17 1841)  . milrinone 0.25 mcg/kg/min (03/01/17 0623)    PRN Medications: sodium chloride, acetaminophen, ALPRAZolam, docusate sodium, lidocaine-EPINEPHrine, ondansetron (ZOFRAN) IV, sodium chloride flush, traMADol, triamcinolone cream   Assessment/Plan   1. Acute on chronic systolic CHF: Ischemic cardiomyopathy. With low cardiac output noted on during admission in 10/17. Had upgrade to Medtronic BiV ICD in 10/17. However, with VT storm in 12/17, the LV lead was turned off due to concerns for pro-arrhythmia.  In 1/18, he developed a device infection and the BiV-ICD was removed and later replaced with a Medtronic ICD with His bundle pacing.  RHC (1/18) with preserved cardiac output.  TEE (1/18) with EF 15%.  CPX in 5/18 with moderate HF limitation.  Most recent echo in 03/25/2017 with EF 20-25%.  The RV was severely dilated but mild to moderately hypokinetic. Most recently, he lost capture of His bundle lead and AV delay was increased to limit RV pacing.  EP restarted His bundle pacing with maximal output to attain capture.  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 Low cardiac index 1.8 on RHC, milrinone begun (he was His pacing at time of RHC).  He was admitted from clinic with NYHA class IV symptoms.  - Attempted milrinone wean over weekend with fall in co-ox to 56%.  Increased back to 0.25, co-ox improved to 67%. Continue milrinone 0.25 mcg. - Diuretics stopped on 1/5 due to marked orthostasis. Gave 250 NS back.  - CVP 4. Volume status stable. pounds. Continue  lasix 80 daily.  - Did not tolerate Entresto due to orthostasis, has been taken off losartan with elevated creatinine and low BP.  - Continue spironolactone 12.5 mg daily (did not tolerate  increase due to abdominal discomfort).  - Continue digoxin 0.125 mg every other day. Level ok this admit.  - He is off bisoprolol, will leave off for now with low output.  - He did not tolerate Corlanor due to diarrhea.  - Sildenafil stopped at his request due to orthostasis - Concerned about his long-term prognosis at this point.  LV lead was pro-arrhythmic and he will likely lose His bundle capture in the near future.  Not sure replacing His lead will be enough to stave off LVAD placement.  Low output on RHC milrinone begun.  He would not be a good long-term milrinone candidate due to pro-arrhythmic effect.  We have discussed LVAD with him.  He will   need some tuning prior to this, would like to see PA pressure down and full diuresis.  Hopefully creatinine will come down with inotropic support.   Dr. Elita Dame planning to repeat RHC this week on milrinone, hopefully creatinine will be better. LVAD workup in process => LVAD coordinators to see and has seen Dr. Van Trigt.  HFSW evaluating today.  2.  CAD: LHC in 12/17 with stable coronary disease.   - No s/s ischemia. No CP.  - Continue ASA, Plavix, statin.  Good lipids 10/18. 3. Smoking: has stopped smoking since 2/18.  4. 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 Suspect this contributed to HF with loss of AV synchrony (and also was bradycardic). 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  He was BiV paced after 10/17 admission, but LV lead was turned off due to concern for pro-arrhythmia.  He now has a Medtronic ICD and had been His bundle pacing, but lost capture in His lead and was RV pacing prior to admission.   - See above discussion. EP has seen. His Bundle pacing turned to max output but this may not last long before capture lost fully. No change. - EP to evaluate today.   5. Ascending aortic aneurysm: CTA chest in 8/18 actually did not show a significant aneurysm. No change. 6. Ventricular tachycardia: VT storm in 12/17, may  have been related to LV pacing.  Milrinone gtt started, would not use long-term due to risk of proarrhythmia.  - Continue amiodarone and mexiletine. No VT/VF by device interrogation.   Continue amio 200 mg bid with milrinone use.  - Recent LFTs and TSH ok with amiodarone use. No change.  - Follow electrolytes closely. Keep K. 4.0 and Mg > 2.0  7. PTSD: Patient is on paroxetine due to significant anxiety around shocks.   - Stable. 8. Pulmonary HTN: Severe on RHC 02/23/2017.  Mixed pulmonary venous/pulmonary arterial HTN.  - Continue sildenafil 20 mg tid on cath this admit. Held at patient's request due to orthostasis. Can consider rechallenge if PA pressures still high on milrinone.  As above, will eventually repeat RHC. No change. 9. AKI on CKD stage 3:  - Creatinine 1.9 . Stable.  - Continue to follow closely.  10. LV thrombus: Noted on echo.  - Remains on heparin gtt.   Work up underway for LVAD.   Length of Stay: 7  Amy Clegg, NP  03/01/2017 10:43 AM  Advanced Heart Failure Team Pager [phone] (M-F; 7a - 4p)  Please contact [facility] for night-coverage after hours (4p -7a ) and weekends on [email or web address]  Patient seen with NP, agree with the above note.  Hemodynamics good today, creatinine trending down at 1.9. CVP 4.   - Continue current milrinone (failed wean) and po Lasix.   - Want to avoid long-term milrinone due to pro-arrhythmia risk in patient with history of VT storm/PTSD from this.  - Workup for LVAD ongoing.  Will discuss at [facility] today.  Will plan repeat RHC tomorrow on milrinone to reassess PA pressure, cardiac output. Procedure risks/benefits discussed with patient and he agrees.   Concern over weekend for possible loss of capture by pacemaker. EP to see today.   Carinne Brandenburger 03/01/2017 12:30 PM  

## 2017-03-02 ENCOUNTER — Encounter (HOSPITAL_COMMUNITY): Admission: AD | Disposition: E | Payer: Self-pay | Source: Ambulatory Visit | Attending: Cardiology

## 2017-03-02 DIAGNOSIS — I509 Heart failure, unspecified: Secondary | ICD-10-CM

## 2017-03-02 DIAGNOSIS — I255 Ischemic cardiomyopathy: Secondary | ICD-10-CM

## 2017-03-02 HISTORY — PX: RIGHT HEART CATH: CATH118263

## 2017-03-02 LAB — POCT I-STAT 3, VENOUS BLOOD GAS (G3P V)
ACID-BASE DEFICIT: 2 mmol/L (ref 0.0–2.0)
Acid-base deficit: 2 mmol/L (ref 0.0–2.0)
BICARBONATE: 24.6 mmol/L (ref 20.0–28.0)
Bicarbonate: 24.8 mmol/L (ref 20.0–28.0)
O2 SAT: 57 %
O2 SAT: 59 %
PCO2 VEN: 48.6 mmHg (ref 44.0–60.0)
PH VEN: 7.315 (ref 7.250–7.430)
TCO2: 26 mmol/L (ref 22–32)
TCO2: 26 mmol/L (ref 22–32)
pCO2, Ven: 48.5 mmHg (ref 44.0–60.0)
pH, Ven: 7.313 (ref 7.250–7.430)
pO2, Ven: 33 mmHg (ref 32.0–45.0)
pO2, Ven: 34 mmHg (ref 32.0–45.0)

## 2017-03-02 LAB — COOXEMETRY PANEL
CARBOXYHEMOGLOBIN: 1.2 % (ref 0.5–1.5)
Methemoglobin: 1.2 % (ref 0.0–1.5)
O2 Saturation: 60.5 %
TOTAL HEMOGLOBIN: 11.4 g/dL — AB (ref 12.0–16.0)

## 2017-03-02 LAB — BASIC METABOLIC PANEL
Anion gap: 8 (ref 5–15)
BUN: 29 mg/dL — AB (ref 6–20)
CHLORIDE: 98 mmol/L — AB (ref 101–111)
CO2: 27 mmol/L (ref 22–32)
Calcium: 9.2 mg/dL (ref 8.9–10.3)
Creatinine, Ser: 2.01 mg/dL — ABNORMAL HIGH (ref 0.61–1.24)
GFR calc Af Amer: 39 mL/min — ABNORMAL LOW (ref >60)
GFR calc non Af Amer: 34 mL/min — ABNORMAL LOW (ref >60)
GLUCOSE: 97 mg/dL (ref 65–99)
POTASSIUM: 5 mmol/L (ref 3.5–5.1)
Sodium: 133 mmol/L — ABNORMAL LOW (ref 135–145)

## 2017-03-02 LAB — CBC WITH DIFFERENTIAL/PLATELET
Basophils Absolute: 0.1 10*3/uL (ref 0.0–0.1)
Basophils Relative: 1 %
Eosinophils Absolute: 0.3 10*3/uL (ref 0.0–0.7)
Eosinophils Relative: 4 %
HEMATOCRIT: 34.5 % — AB (ref 39.0–52.0)
HEMOGLOBIN: 11.1 g/dL — AB (ref 13.0–17.0)
LYMPHS ABS: 1.6 10*3/uL (ref 0.7–4.0)
Lymphocytes Relative: 22 %
MCH: 29.9 pg (ref 26.0–34.0)
MCHC: 32.2 g/dL (ref 30.0–36.0)
MCV: 93 fL (ref 78.0–100.0)
Monocytes Absolute: 0.8 10*3/uL (ref 0.1–1.0)
Monocytes Relative: 11 %
NEUTROS ABS: 4.4 10*3/uL (ref 1.7–7.7)
NEUTROS PCT: 62 %
Platelets: 260 10*3/uL (ref 150–400)
RBC: 3.71 MIL/uL — ABNORMAL LOW (ref 4.22–5.81)
RDW: 14.4 % (ref 11.5–15.5)
WBC: 7.2 10*3/uL (ref 4.0–10.5)

## 2017-03-02 LAB — PLATELET INHIBITION P2Y12: Platelet Function  P2Y12: 207 [PRU] (ref 194–418)

## 2017-03-02 LAB — HEPARIN LEVEL (UNFRACTIONATED): Heparin Unfractionated: 0.48 IU/mL (ref 0.30–0.70)

## 2017-03-02 SURGERY — RIGHT HEART CATH
Anesthesia: LOCAL

## 2017-03-02 MED ORDER — ACETAMINOPHEN 325 MG PO TABS: 650.0000 mg | ORAL_TABLET | ORAL | Status: DC | PRN

## 2017-03-02 MED ORDER — LIDOCAINE HCL (PF) 1 % IJ SOLN
INTRAMUSCULAR | Status: AC
  Filled 2017-03-02: qty 30

## 2017-03-02 MED ORDER — SODIUM CHLORIDE 0.9% FLUSH
3.0000 mL | INTRAVENOUS | Status: DC | PRN
  Administered 2017-03-04: 3 mL via INTRAVENOUS
  Filled 2017-03-02: qty 3

## 2017-03-02 MED ORDER — MIDAZOLAM HCL 2 MG/2ML IJ SOLN
INTRAMUSCULAR | Status: DC | PRN
  Administered 2017-03-02 (×2): 1 mg via INTRAVENOUS

## 2017-03-02 MED ORDER — HEPARIN (PORCINE) IN NACL 2-0.9 UNIT/ML-% IJ SOLN
INTRAMUSCULAR | Status: AC | PRN
  Administered 2017-03-02: 500 mL

## 2017-03-02 MED ORDER — SODIUM CHLORIDE 0.9% FLUSH
3.0000 mL | Freq: Two times a day (BID) | INTRAVENOUS | Status: DC
  Administered 2017-03-02 – 2017-03-08 (×13): 3 mL via INTRAVENOUS

## 2017-03-02 MED ORDER — ONDANSETRON HCL 4 MG/2ML IJ SOLN: 4.0000 mg | Freq: Four times a day (QID) | INTRAMUSCULAR | Status: DC | PRN

## 2017-03-02 MED ORDER — FENTANYL CITRATE (PF) 100 MCG/2ML IJ SOLN
INTRAMUSCULAR | Status: AC
  Filled 2017-03-02: qty 2

## 2017-03-02 MED ORDER — FENTANYL CITRATE (PF) 100 MCG/2ML IJ SOLN
INTRAMUSCULAR | Status: DC | PRN
  Administered 2017-03-02 (×4): 25 ug via INTRAVENOUS
  Administered 2017-03-02: 50 ug via INTRAVENOUS

## 2017-03-02 MED ORDER — HEPARIN (PORCINE) IN NACL 2-0.9 UNIT/ML-% IJ SOLN
INTRAMUSCULAR | Status: AC
  Filled 2017-03-02: qty 500

## 2017-03-02 MED ORDER — SODIUM CHLORIDE 0.9 % IV SOLN: 250.0000 mL | INTRAVENOUS | Status: DC | PRN

## 2017-03-02 MED ORDER — HEPARIN (PORCINE) IN NACL 100-0.45 UNIT/ML-% IJ SOLN
1750.0000 [IU]/h | INTRAMUSCULAR | Status: DC
  Administered 2017-03-04 – 2017-03-05 (×2): 1550 [IU]/h via INTRAVENOUS
  Administered 2017-03-07 – 2017-03-09 (×4): 1750 [IU]/h via INTRAVENOUS
  Filled 2017-03-02 (×10): qty 250

## 2017-03-02 MED ORDER — LIDOCAINE HCL (PF) 1 % IJ SOLN
INTRAMUSCULAR | Status: DC | PRN
  Administered 2017-03-02: 2 mL
  Administered 2017-03-02: 15 mL

## 2017-03-02 MED ORDER — IOPAMIDOL (ISOVUE-370) INJECTION 76%
INTRAVENOUS | Status: AC
  Filled 2017-03-02: qty 50

## 2017-03-02 MED ORDER — IOPAMIDOL (ISOVUE-370) INJECTION 76%
INTRAVENOUS | Status: DC | PRN
  Administered 2017-03-02: 10 mL via INTRAVENOUS

## 2017-03-02 MED ORDER — MIDAZOLAM HCL 2 MG/2ML IJ SOLN
INTRAMUSCULAR | Status: AC
Start: 2017-03-02 — End: 2017-03-02
  Filled 2017-03-02: qty 2

## 2017-03-02 SURGICAL SUPPLY — 9 items
CATH BALLN WEDGE 5F 110CM (CATHETERS) ×1 IMPLANT
CATH SWAN GANZ 7F STRAIGHT (CATHETERS) ×1 IMPLANT
GUIDEWIRE .025 260CM (WIRE) ×1 IMPLANT
PACK CARDIAC CATHETERIZATION (CUSTOM PROCEDURE TRAY) ×2 IMPLANT
SHEATH GLIDE SLENDER 4/5FR (SHEATH) ×1 IMPLANT
SHEATH PINNACLE 7F 10CM (SHEATH) ×1 IMPLANT
TRANSDUCER W/STOPCOCK (MISCELLANEOUS) ×2 IMPLANT
TUBING ART PRESS 72  MALE/FEM (TUBING) ×1
TUBING ART PRESS 72 MALE/FEM (TUBING) IMPLANT

## 2017-03-02 NOTE — Progress Notes (Signed)
Day of Surgery Procedure(s) (LRB): RIGHT HEART CATH (N/A) Subjective: undergoing repeat RHC today Co-ox good at 60 % Creat 2.0 DLCO 50% predicted plavix washout in progress, p2y12 up to 200 No complaints other than dizziness  Objective: Vital signs in last 24 hours: Temp:  [97.7 F (36.5 C)-97.8 F (36.6 C)] 97.7 F (36.5 C) (01/08 1204) Pulse Rate:  [72-88] 72 (01/08 1204) Cardiac Rhythm: A-V Sequential paced;Bundle branch block (01/08 0700) Resp:  [16-18] 18 (01/08 1204) BP: (98-154)/(55-80) 120/58 (01/08 1204) SpO2:  [94 %-100 %] 100 % (01/08 1534) Weight:  [220 lb 14.4 oz (100.2 kg)] 220 lb 14.4 oz (100.2 kg) (01/08 16100608)  Hemodynamic parameters for last 24 hours: CVP:  [4 mmHg-5 mmHg] 4 mmHg  Intake/Output from previous day: 01/07 0701 - 01/08 0700 In: 1711.3 [P.O.:1180; I.V.:531.3] Out: 2875 [Urine:2875] Intake/Output this shift: Total I/O In: 0  Out: 375 [Urine:375]   Lab Results: Recent Labs    02/28/17 0330 03/01/2017 0500  WBC 7.7 7.2  HGB 11.1* 11.1*  HCT 35.0* 34.5*  PLT 261 260   BMET:  Recent Labs    03/01/17 0430 02/26/2017 0800  NA 132* 133*  K 4.5 5.0  CL 97* 98*  CO2 27 27  GLUCOSE 103* 97  BUN 29* 29*  CREATININE 1.90* 2.01*  CALCIUM 9.3 9.2    PT/INR: No results for input(s): LABPROT, INR in the last 72 hours. ABG    Component Value Date/Time   PHART 7.487 (H) 03/23/2016 2002   HCO3 25.5 03/05/2017 1336   TCO2 27 03/10/2017 1336   ACIDBASEDEF 4.9 (H) 01/27/2016 1655   O2SAT 60.5 03/17/2017 0555   CBG (last 3)  No results for input(s): GLUCAP in the last 72 hours.  Assessment/Plan: S/P Procedure(s) (LRB): RIGHT HEART CATH (N/A) prob HM3 VAD implant with aortic valve repair next week   LOS: 8 days    Kathlee Nationseter Van Trigt III 03/10/2017

## 2017-03-02 NOTE — Progress Notes (Signed)
CSW met with patient and his daughters at bedside. Daughters asked appropriate questions regarding role of the caregiver and discussed their availability to provide care to patient post LVAD. Daughters appear very supportive and committed to patient. CSW will continue to follow for supportive needs during LVAD work up. Raquel Sarna, [place], [place]

## 2017-03-02 NOTE — Progress Notes (Signed)
LVAD Initial Psychosocial Screening  Date/Time Initiated:  03/01/17 10:45am Referral Source:  [name] ScottLesley [name], VAD Coordinator Referral Reason:  LVAD implant Source of Information:  Pt, sisters and chart review  Demographics Name:  [name] Address:  [address] [place] [id number] Home phone: [phone]    Cell: [phone] Marital Status: Legally Separated  Faith:  [employer] Primary Language:  English SS: [id number]   DOB:  06/26/1955  Medical & Follow-up Adherence to Medical regimen/INR checks: compliant  Medication adherence: compliant  Physician/Clinic Appointment Attendance: compliant   Advance Directives: Do you have a Living Will or Medical POA? Yes  Would you like to complete a Living Will and Medical POA prior to surgery?  No Do you have Goals of Care? Yes  Have you had a consult with the Palliative [facility] at [facility]? Pending  Psychological Health Appearance:  Unremarkable and In hospital gown Mental Status:  Alert, oriented Eye Contact:  Good Thought Content:  Coherent Speech:  Unremarkable Mood:  Appropriate, Positive and Pleasant  Affect:  Appropriate to circumstance Insight:  Good Judgement: Other (Comment) Sound Interaction Style:  Engaged and Positive  Family/Social Information Who lives in your home? Name:   Relationship:   [name]   sister  Other family members/support persons in your life? Name:   Relationship:   [name]   Sister            [phone] [name]   [name]   [name] [name]   [name] Caregiving Needs Who is the primary caregiver? [name]/[name] (twin sisters) Health status:  Fair Do you drive?  Yes Do you work?   [name] works/[name] cares for husband Physical Limitations:  [name] has some ambulation challenges Do you have other care giving responsibilities?  [name] cares for husband with dementia  Who is the secondary caregiver? [name] Health status:  Good Do you drive?  yes Do you work?   Yes Physical Limitations:  none Do you have other care giving responsibilities?  Yes  Home Environment/Personal Care Do you have reliable phone service? Yes  If so, what is the number? [phone]   Do you own or rent your home? Lives with sister Number of steps into the home? 10 Short steps How many levels in the home? 1 level Assistive devices in the home? Walker, crutches and BSC Electrical needs for LVAD ([name] prong outlets)? yes Second hand smoke exposure in the home? no [facility] 25 minutes Self-care: independent Ambulation: independent  Community Are you active with community agencies/resources/homecare? No Agency Name: none Are you active in a [name], synagogue, mosque or other faith based community? Yes Faith based institutions name: [name] What other sources do you have for spiritual support? family Are you active in any clubs or social organizations?  none What do you do for fun?  Hobbies?  Interests? Ride my motorcycle  Education/Work Information What is the last grade of school you completed? 12th Preferred method of learning?  Written, Verbal and Hands on Do you have any problems with reading or writing?  No Are you currently employed?  No  When were you last employed? September, 2017  Name of employer? 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 No If yes, what type of employment do you hope to find? n/a Are  you interested in job training or learning new skills?  No Did you serve in the military? No  If so, what branch? Other none  Financial Information What is your source of income? SSD- approx. $1600.00 Do you have difficulty meeting your monthly expenses? No Can you budget for the monthly cost for dressing supplies post procedure? Yes  Primary Health  insurance:  Medicaid Secondary Insurance: Prescription plan: medicaid [facility]acy[facility] What are your prescription co-pays? Varies  Do you use mail order for your prescriptions?  No Have you ever had to refuse medication due to cost?  Yes but called MD to seek assistance Have you applied for Medicaid?  Yes Have you applied for Social Security Disability (SSI)  n/a  Medical Information Briefly describe why you are here for evaluation: Patient states he had [name] heart attacks starting in 1998 and had a pacemaker/defibrillator placed in 2004. He had the defibrillator go off 45 times last year and as a result suffered some PTSD from the event. Patient doing much better mentally from the event and continues to take medications for depression/anxiety. Do you have a PCP or other medical provider? [name] Peak, PA-C Are you able to complete your ADL's? Yes Do you have a history of trauma, physical, emotional, or sexual abuse? Patient reports emotional issues from the defibrillator event but much improved since last year at this time. Do you have any family history of heart problems? Aunt and Uncle Do you smoke now or past usage? past usage    Quit date: Patient quit one year ago although admits to an occasional cigarette with last one about 6 months ago. Do you drink alcohol now or past usage? past usage  Occasional use of liquor Are you currently using illegal drugs or misuse of medication or past usage? never  Have you ever been treated for substance abuse? No  Mental Health History How have you been feeling in the past year? Pretty well Have you ever had any problems with depression, anxiety or other mental health issues? Self reported anxiety and depression Do you see a counselor, psychiatrist or therapist?  No If you are currently experiencing problems are you interested in talking with a professional? No Have you or are you taking medications for anxiety/depression or any mental health concerns?   Yes  Current Medications: Paxil and Xanax What are your coping strategies under stressful situations? My motto is "don't get excited" Are there any other stressors in your life? none Have you had any past or current thoughts of suicide? denies How many hours do you sleep at night?7-8-hours How is your appetite? good Would you be interested in attending the LVAD support group? yes  PHQ2 Depression Scale: 1 PHQ9 Depression scale (if positive PHQ2 screen):     Legal Do you currently have any legal issues/problems?  no Have you had any legal issues/problems in the past?  no Do you have a Durable POA?  no   Plan for VAD Implementation Do you know and understand what happens during the VAD surgery? Patient Verbalizes Understanding  of surgery and able to describe details What do you know about the risks and side effect associated with VAD surgery? Patient Verbalizes Understanding  of risks (infection, stroke and death) Explain what will happen right after surgery: Patient Verbalizes Understanding  of OR to ICU and will be intubated What is your plan for transportation for the first 8 weeks post-surgery? (Patients are not recommended to drive post-surgery for 8 weeks)  Driver:    Sisters and Daughters Do you have airbags in your vehicle?  Yes There is a risk of discharging the device if the airbag were to deploy. What do you know about your diet post-surgery? Patient Verbalizes Understanding  of Heart healthy How do you plan to monitor your medications, current and future?   Family will assist with pre pour into med box How do you plan to complete ADL's post-surgery?  Family members Will it be difficult to ask for help from your caregivers?  Not at all  Please explain what you hope will be improved about your life as a result of receiving the LVAD? Longevity Please tell me your biggest concern or fear about living with the LVAD?   None at the moment How do you cope with your concerns and  fears?   Try to address them Please explain your understanding of how their body will change?  Patient denies any concerns Are you worried about these changes? No  Do you see any barriers to your surgery or follow-up? none  Understanding of LVAD Patient states understanding of the following: Surgical procedures and risks, Electrical need for LVAD ([name] prong outlets), Safety precautions with LVAD (water, etc.), LVAD daily self-care (dressing changes, computer check, extra supplies), Outpatient follow up (LVAD clinic appts, monitoring blood thinners) and Need for Emergency Planning  Discussed and Reviewed with Patient and Caregiver  Patient's current level of motivation to prepare for LVAD: motivated to feel better and at peace with the plan for implant Patient's present Level of Consent for LVAD: Ready    Education provided to patient/family/caregiver:   Caregiver role and responsibiltiy, Financial planning for LVAD, Role of Clinical Social Worker and Signs of Depression and Anxiety   Caregiver questions Please explain what you hope will be improved about your life and loved one's life as a result of receiving the LVAD?  He will have a normal life, do what he would like and ability to ride his motorcycle What is your biggest concern or fear about caregiving with an LVAD patient?  Nothing really What is your plan for availability to provide care 24/7 x2 weeks post op and dressing changes ongoing?  Patient's sisters will be primary and coordinate 24/7 care between each other and [name] as back ups. Who is the relief/backup caregiver and what is their availability?  [name]  Preferred method of learning? Written, Verbal and Hands on  Do you drive? yes How do you handle stressful situations? "I can handle pretty much anything"  Do you think you can do this? yes Is there anything that concerns about caregiving?  no Do you provide caregiving to anyone else?  [name] cares for husband  with dementia  Caregiver's current level of motivation to prepare for LVAD: Ready Caregiver's present level of consent for LVAD: Agreed  Clinical Interventions Needed:    CSW will monitor signs and symptoms of depression and assist with adjustment to life with an LVAD.  CSW encouraged attendance with the LVAD Support Group to assist further with adjustment and post implant peer support. CSW will assist sisters and [name] as needed for support throughout the VAD process.  Clinical Impressions/Recommendations:   Patient is a 62yo male who is separated and resides with his sister [name] Hymen. Patient has [name] grown [name] who live close by and are very supportive. Patient reports compliance with his medical regimen and has supportive family to assist with caregiving needs post implant. Patient's sister [name] Hymen will be primary  and Junious Dresser will assist during the day. Patient's [name] will be back ups to assist with the 24/7 coverage and any other needs as they arise. Patient loves to ride his motorcycle and is hopeful to return to riding soon. He completed the 12th grade in school and prefers to learn from a combination of reading and hands on.  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 Patient currently on SSD and has medicaid. He denies any concerns with meeting his monthly expenses. Patient shared his medical past and family history. He denies substance use but admits to occasional alcohol use and quit smoking one year ago. Patient denies any physical or sexual abuse but admits to emotional struggles from the defibrillator issue back in December, 2017. He denies any formal diagnosis of mental health although admits to anxiety and depression and currently on Paxil and xanax. He denies any suicidal ideation at this time. Patient states he has been declining over the past year with his cardiac functions.  He denies any other stressors in his  life. Patient states he hopes to have "longevity" as a result of getting the LVAD. He denies any concerns or fear about body image.  Mr Laur appears to be a good candidate for LVAD implantation with good family support. CSW will follow through implant hospitalization and outpatient  [facility] and provide supportive needs as identified.    Marcy Siren, [place] CCSW-MCS [phone]

## 2017-03-02 NOTE — Progress Notes (Signed)
Patient ID: [name], male   DOB: 12/07/1955, 62 y.o.   MRN: [id number]     Advanced Heart Failure Rounding Note  HF Cardiology: [name]   Subjective:     Has RUE PICC (Unable to placed tunneled PICC with R ICD and occluded left subclavian)  CO-OX 60.5 %. Remains on milrinone 0.25 mcg.   Had some dizziness this morning. Denies CP.    RHC 02/27/2017 RA mean 9 RV 79/12 PA 85/36, mean 59 PCWP mean 29 Oxygen saturations: PA 51% AO 98% Cardiac Output (Fick) 3.95  Cardiac Index (Fick) 1.8 PVR 7.6 WU PAPi 5  Echo (1/2): EF 20-25% with severely dilated LV, severe RV dilation with mild to moderately decreased systolic function, moderate MR, moderate AI. Suspect LV thrombus.   Objective:   Weight Range: 220 lb 14.4 oz (100.2 kg) Body mass index is 30.81 kg/m.   Vital Signs:   Temp:  [97.7 F (36.5 C)-98 F (36.7 C)] 97.7 F (36.5 C) (01/08 40980608) Pulse Rate:  [73-88] 74 (01/08 0608) Resp:  [16-18] 16 (01/07 2358) BP: (98-154)/(55-80) 137/68 (01/08 0608) SpO2:  [94 %-100 %] 94 % (01/08 11910608) Weight:  [220 lb 14.4 oz (100.2 kg)] 220 lb 14.4 oz (100.2 kg) (01/08 0608) Last BM Date: 02/27/2017  Weight change: Filed Weights   02/28/17 0422 03/01/17 0508 03/23/2017 [id number]  Weight: 223 lb 1.6 oz (101.2 kg) 221 lb 11.2 oz (100.6 kg) 220 lb 14.4 oz (100.2 kg)    Intake/Output:   Intake/Output Summary (Last 24 hours) at 03/25/2017 0926 Last data filed at 03/19/2017 [id number] Gross per 24 hour  Intake 1351.3 ml  Output 2875 ml  Net -1523.7 ml     Physical Exam  CVP 3-4 General:  Well appearing. No resp difficulty. In bed  HEENT: normal Neck: supple. no JVD. Carotids 2+ bilat; no bruits. No lymphadenopathy or thryomegaly appreciated. Cor: PMI nondisplaced. Regular rate & rhythm. No rubsor murmurs. + S3  Lungs: clear Abdomen: soft, nontender, nondistended. No hepatosplenomegaly. No bruits or masses. Good bowel sounds. Extremities: no cyanosis, clubbing, rash, RUE PICC. No edema Neuro:  alert & orientedx3, cranial nerves grossly intact. moves all 4 extremities w/o difficulty. Affect pleasant   Telemetry   NSR Biv 70-80s personally reviewed.   Labs    CBC Recent Labs    02/28/17 0330 03/04/2017 0500  WBC 7.7 7.2  NEUTROABS 4.5 4.4  HGB 11.1* 11.1*  HCT 35.0* 34.5*  MCV 93.1 93.0  PLT 261 260   Basic Metabolic Panel Recent Labs    13/09/6499/07/19 0430 03/06/2017 0800  NA 132* 133*  K 4.5 5.0  CL 97* 98*  CO2 27 27  GLUCOSE 103* 97  BUN 29* 29*  CREATININE 1.90* 2.01*  CALCIUM 9.3 9.2   Liver Function Tests No results for input(s): AST, ALT, ALKPHOS, BILITOT, PROT, ALBUMIN in the last 72 hours. No results for input(s): LIPASE, AMYLASE in the last 72 hours. Cardiac Enzymes No results for input(s): CKTOTAL, CKMB, CKMBINDEX, TROPONINI in the last 72 hours.  BNP: BNP (last 3 results) Recent Labs    10/07/16 2103 02-19-2017 1331 02-19-2017 1613  BNP 809.6* 953.4* 1,016.9*    ProBNP (last 3 results) No results for input(s): PROBNP in the last 8760 hours.   D-Dimer No results for input(s): DDIMER in the last 72 hours. Hemoglobin A1C No results for input(s): HGBA1C in the last 72 hours. Fasting Lipid Panel No results for input(s): CHOL, HDL, LDLCALC, TRIG, CHOLHDL, LDLDIRECT in the last 72  hours. Thyroid Function Tests No results for input(s): TSH, T4TOTAL, T3FREE, THYROIDAB in the last 72 hours.  Invalid input(s): FREET3  Other results:   Imaging   No results found.  Medications:     Scheduled Medications: . amiodarone  200 mg Oral BID  . aspirin EC  81 mg Oral Daily  . atorvastatin  40 mg Oral Daily  . digoxin  125 mcg Oral QODAY  . famotidine  20 mg Oral BID  . feeding supplement (ENSURE ENLIVE)  237 mL Oral Q1500  . hydrocortisone cream   Topical TID  . magnesium oxide  400 mg Oral Daily  . mexiletine  200 mg Oral Q12H  . PARoxetine  20 mg Oral Daily  . potassium chloride  40 mEq Oral BID  . sodium chloride flush  3 mL Intravenous  Q12H  . sodium chloride flush  3 mL Intravenous Q12H  . spironolactone  12.5 mg Oral Daily  . tamsulosin  0.4 mg Oral Daily    Infusions: . sodium chloride Stopped (02/25/17 1741)  . sodium chloride    . sodium chloride    . heparin 1,550 Units/hr (03/14/2017 0547)  . milrinone 0.25 mcg/kg/min (03/01/2017 0547)    PRN Medications: sodium chloride, sodium chloride, acetaminophen, ALPRAZolam, docusate sodium, ondansetron (ZOFRAN) IV, sodium chloride flush, sodium chloride flush, traMADol, triamcinolone cream   Assessment/Plan   1. Acute on chronic systolic CHF: Ischemic cardiomyopathy. With low cardiac output noted on during admission in 10/17. Had upgrade to Medtronic BiV ICD in 10/17. However, with VT storm in 12/17, the LV lead was turned off due to concerns for pro-arrhythmia.  In 1/18, he developed a device infection and the BiV-ICD was removed and later replaced with a Medtronic ICD with His bundle pacing.  RHC (1/18) with preserved cardiac output.  TEE (1/18) with EF 15%.  CPX in 5/18 with moderate HF limitation.  Most recent echo in 03/04/2017 with EF 20-25%.  The RV was severely dilated but mild to moderately hypokinetic. Most recently, he lost capture of His bundle lead and AV delay was increased to limit RV pacing.  EP restarted His bundle pacing with maximal output to attain capture.  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 Low cardiac index 1.8 on RHC, milrinone begun (he was His pacing at time of RHC).  He was admitted from clinic with NYHA class IV symptoms.  - Attempted milrinone wean over weekend with fall in co-ox to 56%.   Stable on higher dose of milrinone 0.25 mcg.  - Diuretics stopped due to elevated creatinine. Stop Potassium.   - Did not tolerate Entresto due to orthostasis, has been taken off losartan with elevated creatinine and low BP.  - Continue spironolactone 12.5 mg daily (did not tolerate increase due to abdominal  discomfort).  - Continue digoxin 0.125 mg every other day. Level ok this admit.  - He is off bisoprolol, will leave off for now with low output.  - He did not tolerate Corlanor due to diarrhea.  - Sildenafil stopped at his request due to orthostasis - Concerned about his long-term prognosis at this point.  LV lead was pro-arrhythmic and he will likely lose His bundle capture in the near future.  Not sure replacing His lead will be enough to stave off LVAD placement.  Low output on RHC milrinone begun.  He would not be a good long-term milrinone candidate due to pro-arrhythmic effect.   VAD work up under way.  HFSW evaluation completed.  Dr Maren Beach following.   2.  CAD: LHC in 12/17 with stable coronary disease.   - No S/S ischemia.  No CP.  - Continue ASA, Plavix, statin.  Good lipids 10/18. 3. Smoking: has stopped smoking since 2/18.  4. 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 Suspect this contributed to HF with loss of AV synchrony (and also was bradycardic). 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  He was BiV paced after 10/17 admission, but LV lead was turned off due to concern for pro-arrhythmia.  He now has a Medtronic ICD and had been His bundle pacing, but lost capture in His lead and was RV pacing prior to admission.   - See above discussion. EP has seen. His Bundle pacing turned to max output but this may not last long before capture lost fully. No change. -Dr Shirlee Latch discussed with EP.  5. Ascending aortic aneurysm: CTA chest in 8/18 actually did not show a significant aneurysm. No change. 6. Ventricular tachycardia: VT storm in 12/17, may have been related to LV pacing.  Milrinone gtt started, would not use long-term due to risk of proarrhythmia.  - Continue amiodarone and mexiletine. No VT/VF by device interrogation.   Continue amio 200 mg bid with milrinone use.  - Recent LFTs and TSH ok with amiodarone use. No change.  - Follow electrolytes closely. Keep  K. 4.0 and Mg > 2.0  K 5 Stop K supplement.  7. PTSD: Patient is on paroxetine due to significant anxiety around shocks.   - Stable. 8. 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  Mixed pulmonary venous/pulmonary arterial HTN.  - Continue sildenafil 20 mg tid on cath this admit. Held at patient's request due to orthostasis. Can consider rechallenge if PA pressures still high on milrinone.   RHC today.  9. AKI on CKD stage 3:  - Creatinine 1.9 >2.01.  BMET in am.  10. LV thrombus: Noted on echo.  - Remains on heparin gtt.   RHC today . Stop diuretic and potassium.  Work up underway for LVAD.   Length of Stay: 8  Amy Clegg, NP  03-06-2017 9:26 AM  Advanced Heart Failure Team Pager [phone] (M-F; 7a - 4p)  Please contact [facility] for night-coverage after hours (4p -7a ) and weekends on [email or web address]  Patient seen with NP, agree with the above note.  Lasix held today with slight rise in creatinine.   RHC repeated today:   RHC Procedural Findings: Hemodynamics (mmHg) RA mean 6 RV 67/8 PA 52/22, mean 36 PCWP mean 21 Oxygen saturations: PA 58% AO 99% Cardiac Output (Fick) 4.73  Cardiac Index (Fick) 2.15 PVR 3.1 WU Cardiac Output (Thermo) 4.54 Cardiac Index (Thermo) 2.06  PVR 3.3 WU  Improved but still elevated PCWP with normal RA pressure.  PA pressure significantly lower.  Cardiac output improved on milrinone but still marginal.   - Increase milrinone to 0.375 mcg/kg/min.   - Restart Lasix probably tomorrow.  - Restart heparin gtt.   LVAD tentatively next week.   Marca Ancona March 06, 2017 4:40 PM

## 2017-03-02 NOTE — Progress Notes (Signed)
ANTICOAGULATION CONSULT NOTE - Follow Up Consult  Pharmacy Consult for heparin Indication: LV thrombus  Allergies  Allergen Reactions  . Corlanor [Ivabradine] Diarrhea    Patient Measurements: Height: 5\' 11"  (180.3 cm) Weight: 220 lb 14.4 oz (100.2 kg) IBW/kg (Calculated) : 75.3 Heparin Dosing Weight: 95.9 kg   Vital Signs: Temp: 97.7 F (36.5 C) (01/08 1204) Temp Source: Oral (01/08 1204) BP: 146/64 (01/08 1637) Pulse Rate: 70 (01/08 1637)  Labs: Recent Labs    02/28/17 0330 02/28/17 0611 03/01/17 0430 03/01/17 0842 12-Apr-2017 0500 12-Apr-2017 0800  HGB 11.1*  --   --   --  11.1*  --   HCT 35.0*  --   --   --  34.5*  --   PLT 261  --   --   --  260  --   HEPARINUNFRC  --  0.42  --  0.50 0.48  --   CREATININE 2.10*  --  1.90*  --   --  2.01*    Estimated Creatinine Clearance: 45.4 mL/min (A) (by C-G formula based on SCr of 2.01 mg/dL (H)).   Medications:  Scheduled:  . [MAR Hold] amiodarone  200 mg Oral BID  . [MAR Hold] aspirin EC  81 mg Oral Daily  . [MAR Hold] atorvastatin  40 mg Oral Daily  . [MAR Hold] digoxin  125 mcg Oral QODAY  . [MAR Hold] famotidine  20 mg Oral BID  . [MAR Hold] feeding supplement (ENSURE ENLIVE)  237 mL Oral Q1500  . [MAR Hold] hydrocortisone cream   Topical TID  . [MAR Hold] magnesium oxide  400 mg Oral Daily  . [MAR Hold] mexiletine  200 mg Oral Q12H  . [MAR Hold] PARoxetine  20 mg Oral Daily  . [MAR Hold] sodium chloride flush  3 mL Intravenous Q12H  . sodium chloride flush  3 mL Intravenous Q12H  . [MAR Hold] spironolactone  12.5 mg Oral Daily  . [MAR Hold] tamsulosin  0.4 mg Oral Daily    Assessment: 61 yom admitted for increased SOB, abdominal swelling, and orthopnea with known history of CHF. Patient found to have suspected LV thrombus - pharmacy consulted to start heparin. Patient was not on anticoagulation PTA.   He is now s/p RHC and pharmacy to restart heparin 6 hours post sheath (removed ~ 4:30pm) -heparin was last  infusing at 1550 units/hr with heparin level= 0.48  Goal of Therapy:  Heparin level 0.3-0.7 units/ml Monitor platelets by anticoagulation protocol: Yes   Plan:  Restart heparin infusion at 1550 units/hr at 10:30pm Check anti-Xa level daily while on heparin Continue to monitor H&H and platelets  Harland GermanAndrew Zeppelin Commisso, Pharm D 02/26/2017 4:51 PM

## 2017-03-02 NOTE — Interval H&P Note (Signed)
History and Physical Interval Note:  03/18/2017 3:27 PM  [name]  has presented today for surgery, with the diagnosis of hf  The various methods of treatment have been discussed with the patient and family. After consideration of risks, benefits and other options for treatment, the patient has consented to  Procedure(s): RIGHT HEART CATH (N/A) as a surgical intervention .  The patient's history has been reviewed, patient examined, no change in status, stable for surgery.  I have reviewed the patient's chart and labs.  Questions were answered to the patient's satisfaction.     [name]

## 2017-03-03 ENCOUNTER — Encounter (HOSPITAL_COMMUNITY): Payer: Self-pay | Admitting: Cardiology

## 2017-03-03 ENCOUNTER — Inpatient Hospital Stay (HOSPITAL_COMMUNITY): Payer: Medicaid Other

## 2017-03-03 LAB — CBC WITH DIFFERENTIAL/PLATELET
Basophils Absolute: 0 10*3/uL (ref 0.0–0.1)
Basophils Relative: 1 %
EOS PCT: 5 %
Eosinophils Absolute: 0.3 10*3/uL (ref 0.0–0.7)
HCT: 31.7 % — ABNORMAL LOW (ref 39.0–52.0)
Hemoglobin: 10.1 g/dL — ABNORMAL LOW (ref 13.0–17.0)
LYMPHS ABS: 0.9 10*3/uL (ref 0.7–4.0)
LYMPHS PCT: 15 %
MCH: 29.7 pg (ref 26.0–34.0)
MCHC: 31.9 g/dL (ref 30.0–36.0)
MCV: 93.2 fL (ref 78.0–100.0)
MONO ABS: 0.8 10*3/uL (ref 0.1–1.0)
Monocytes Relative: 12 %
Neutro Abs: 4.2 10*3/uL (ref 1.7–7.7)
Neutrophils Relative %: 67 %
PLATELETS: 242 10*3/uL (ref 150–400)
RBC: 3.4 MIL/uL — AB (ref 4.22–5.81)
RDW: 14.4 % (ref 11.5–15.5)
WBC: 6.1 10*3/uL (ref 4.0–10.5)

## 2017-03-03 LAB — DIGOXIN LEVEL: Digoxin Level: <0.2 ng/mL — ABNORMAL LOW (ref 0.8–2.0)

## 2017-03-03 LAB — COOXEMETRY PANEL
Carboxyhemoglobin: 1.3 % (ref 0.5–1.5)
Methemoglobin: 1.3 % (ref 0.0–1.5)
O2 Saturation: 63.3 %
TOTAL HEMOGLOBIN: 10.2 g/dL — AB (ref 12.0–16.0)

## 2017-03-03 LAB — BASIC METABOLIC PANEL
ANION GAP: 8 (ref 5–15)
BUN: 23 mg/dL — ABNORMAL HIGH (ref 6–20)
CALCIUM: 8.8 mg/dL — AB (ref 8.9–10.3)
CO2: 25 mmol/L (ref 22–32)
Chloride: 98 mmol/L — ABNORMAL LOW (ref 101–111)
Creatinine, Ser: 1.67 mg/dL — ABNORMAL HIGH (ref 0.61–1.24)
GFR calc Af Amer: 48 mL/min — ABNORMAL LOW (ref >60)
GFR calc non Af Amer: 42 mL/min — ABNORMAL LOW (ref >60)
GLUCOSE: 136 mg/dL — AB (ref 65–99)
Potassium: 4.5 mmol/L (ref 3.5–5.1)
Sodium: 131 mmol/L — ABNORMAL LOW (ref 135–145)

## 2017-03-03 LAB — HEPARIN LEVEL (UNFRACTIONATED)
HEPARIN UNFRACTIONATED: 1.56 [IU]/mL — AB (ref 0.30–0.70)
Heparin Unfractionated: 0.34 IU/mL (ref 0.30–0.70)

## 2017-03-03 LAB — PLATELET INHIBITION P2Y12: Platelet Function  P2Y12: 226 [PRU] (ref 194–418)

## 2017-03-03 MED ORDER — FUROSEMIDE 40 MG PO TABS
40.0000 mg | ORAL_TABLET | Freq: Every day | ORAL | Status: DC
  Administered 2017-03-03: 40 mg via ORAL
  Filled 2017-03-03: qty 1

## 2017-03-03 NOTE — Progress Notes (Signed)
VAD coordinator met with pt this am. His daughters will be here at [place] today for more extensive HM III teaching; will continue teaching at that time.

## 2017-03-03 NOTE — Progress Notes (Addendum)
Patient ID: [name], male   DOB: [birth year], 61 y.o.   MRN: [id number]     Advanced Heart Failure Rounding Note  HF Cardiology: Shirlee Latch   Subjective:      Has RUE PICC (Unable to placed tunneled PICC with R ICD and occluded left subclavian)  Yesterday milrinone increased to 0.375 mcg after RHC. Diuretics held.   Walked 4 times today. Denies SOB.   RHC 03/16/2017 No milrinone.  RA mean 9 RV 79/12 PA 85/36, mean 59 PCWP mean 29 Oxygen saturations: PA 51% AO 98% Cardiac Output (Fick) 3.95  Cardiac Index (Fick) 1.8 PVR 7.6 WU PAPi 5   RHC 03/02/2016  RHC Procedural Findings: On milrinone 0.25 mcg.  Hemodynamics (mmHg) RA mean 6 RV 67/8 PA 52/22, mean 36 PCWP mean 21 Oxygen saturations: PA 58% AO 99% Cardiac Output (Fick) 4.73  Cardiac Index (Fick) 2.15 PVR 3.1 WU Cardiac Output (Thermo) 4.54 Cardiac Index (Thermo) 2.06  PVR 3.3 WU  Echo (1/2): EF 20-25% with severely dilated LV, severe RV dilation with mild to moderately decreased systolic function, moderate MR, moderate AI. Suspect LV thrombus.   Objective:   Weight Range: 221 lb 8 oz (100.5 kg) Body mass index is 30.89 kg/m.   Vital Signs:   Temp:  [97.6 F (36.4 C)-98.8 F (37.1 C)] 98.8 F (37.1 C) (01/09 0814) Pulse Rate:  [70-78] 73 (01/09 0814) Resp:  [0-32] 20 (01/09 0814) BP: (107-151)/(55-72) 107/61 (01/09 0814) SpO2:  [95 %-100 %] 100 % (01/09 0814) Weight:  [221 lb 8 oz (100.5 kg)] 221 lb 8 oz (100.5 kg) (01/09 0546) Last BM Date: Mar 15, 2017  Weight change: Filed Weights   03/01/17 0508 2017-03-15 0608 03/03/17 0546  Weight: 221 lb 11.2 oz (100.6 kg) 220 lb 14.4 oz (100.2 kg) 221 lb 8 oz (100.5 kg)    Intake/Output:   Intake/Output Summary (Last 24 hours) at 03/03/2017 1107 Last data filed at 03/03/2017 0925 Gross per 24 hour  Intake 1121.69 ml  Output 2000 ml  Net -878.31 ml     Physical Exam  CVP 8-9  General:  Well appearing. No resp difficulty HEENT: normal Neck: supple. JVP 7-8 .  Carotids 2+ bilat; no bruits. No lymphadenopathy or thryomegaly appreciated. Cor: PMI nondisplaced. Regular rate & rhythm. No rubs, or murmurs. +S3  Lungs: clear Abdomen: soft, nontender, nondistended. No hepatosplenomegaly. No bruits or masses. Good bowel sounds. Extremities: no cyanosis, clubbing, rash, edema. RUE PICC Neuro: alert & orientedx3, cranial nerves grossly intact. moves all 4 extremities w/o difficulty. Affect pleasant     Telemetry     Labs    CBC Recent Labs    2017-03-15 0500 03/03/17 0556  WBC 7.2 6.1  NEUTROABS 4.4 4.2  HGB 11.1* 10.1*  HCT 34.5* 31.7*  MCV 93.0 93.2  PLT 260 242   Basic Metabolic Panel Recent Labs    21/30/86 0800 03/03/17 0556  NA 133* 131*  K 5.0 4.5  CL 98* 98*  CO2 27 25  GLUCOSE 97 136*  BUN 29* 23*  CREATININE 2.01* 1.67*  CALCIUM 9.2 8.8*   Liver Function Tests No results for input(s): AST, ALT, ALKPHOS, BILITOT, PROT, ALBUMIN in the last 72 hours. No results for input(s): LIPASE, AMYLASE in the last 72 hours. Cardiac Enzymes No results for input(s): CKTOTAL, CKMB, CKMBINDEX, TROPONINI in the last 72 hours.  BNP: BNP (last 3 results) Recent Labs    10/07/16 2103 02/19/2017 1331 01/28/2017 1613  BNP 809.6* 953.4* 1,016.9*  ProBNP (last 3 results) No results for input(s): PROBNP in the last 8760 hours.   D-Dimer No results for input(s): DDIMER in the last 72 hours. Hemoglobin A1C No results for input(s): HGBA1C in the last 72 hours. Fasting Lipid Panel No results for input(s): CHOL, HDL, LDLCALC, TRIG, CHOLHDL, LDLDIRECT in the last 72 hours. Thyroid Function Tests No results for input(s): TSH, T4TOTAL, T3FREE, THYROIDAB in the last 72 hours.  Invalid input(s): FREET3  Other results:   Imaging   Dg Chest 2 View  Result Date: 03/03/2017 CLINICAL DATA:  Shortness of breath for weeks.  Preop LVAD. EXAM: CHEST  2 VIEW COMPARISON:  03/11/2017. FINDINGS: The heart is enlarged. RIGHT-sided pacer unchanged.  No consolidation or edema. IMPRESSION: Cardiomegaly.  No definite active process. Electronically Signed   By: Elsie StainJohn T Curnes M.D.   On: 03/03/2017 08:02    Medications:     Scheduled Medications: . amiodarone  200 mg Oral BID  . aspirin EC  81 mg Oral Daily  . atorvastatin  40 mg Oral Daily  . digoxin  125 mcg Oral QODAY  . famotidine  20 mg Oral BID  . feeding supplement (ENSURE ENLIVE)  237 mL Oral Q1500  . hydrocortisone cream   Topical TID  . magnesium oxide  400 mg Oral Daily  . mexiletine  200 mg Oral Q12H  . PARoxetine  20 mg Oral Daily  . sodium chloride flush  3 mL Intravenous Q12H  . sodium chloride flush  3 mL Intravenous Q12H  . spironolactone  12.5 mg Oral Daily  . tamsulosin  0.4 mg Oral Daily    Infusions: . sodium chloride Stopped (02/25/17 1741)  . sodium chloride    . heparin 1,550 Units/hr (03/25/2017 2230)  . milrinone 0.375 mcg/kg/min (03/03/17 0634)    PRN Medications: sodium chloride, sodium chloride, acetaminophen, ALPRAZolam, docusate sodium, ondansetron (ZOFRAN) IV, sodium chloride flush, sodium chloride flush, traMADol, triamcinolone cream   Assessment/Plan   1. Acute on chronic systolic CHF: Ischemic cardiomyopathy. With low cardiac output noted on during admission in 10/17. Had upgrade to Medtronic BiV ICD in 10/17. However, with VT storm in 12/17, the LV lead was turned off due to concerns for pro-arrhythmia.  In 1/18, he developed a device infection and the BiV-ICD was removed and later replaced with a Medtronic ICD with His bundle pacing.  RHC (1/18) with preserved cardiac output.  TEE (1/18) with EF 15%.  CPX in 5/18 with moderate HF limitation.  Most recent echo in 03/21/2017 with EF 20-25%.  The RV was severely dilated but mild to moderately hypokinetic. Most recently, he lost capture of His bundle lead and AV delay was increased to limit RV pacing.  EP restarted His bundle pacing with maximal output to attain capture.  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 Low cardiac index 1.8 on RHC, milrinone begun (he was His pacing at time of RHC).  He was admitted from clinic with NYHA class IV symptoms.  - Milrinone increased to 0.375 mcg after cath. Today CO-OX is 63%.  - CVP 7-8. Start lasix 40 mg daily.  - Did not tolerate Entresto due to orthostasis, has been taken off losartan with elevated creatinine and low BP.  - Continue spironolactone 12.5 mg daily (did not tolerate increase due to abdominal discomfort).  - Continue digoxin 0.125 mg every other day. Level ok this admit.  - He is off bisoprolol, will leave off for now with low output.  -  He did not tolerate Corlanor due to diarrhea.  - Plan for VAD next week.  Dr Maren Beach following.   2.  CAD: LHC in 12/17 with stable coronary disease.   - No S/S ischemia.  No CP.  - Continue ASA, Plavix, statin.  Good lipids 10/18. 3. Smoking: has stopped smoking since 2/18.  4. 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 Suspect this contributed to HF with loss of AV synchrony (and also was bradycardic). 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  He was BiV paced after 10/17 admission, but LV lead was turned off due to concern for pro-arrhythmia.  He now has a Medtronic ICD and had been His bundle pacing, but lost capture in His lead and was RV pacing prior to admission.   - See above discussion. EP has seen. His Bundle pacing turned to max output but this may not last long before capture lost fully. No change. -Dr Shirlee Latch discussed with EP.  5. Ascending aortic aneurysm: CTA chest in 8/18 actually did not show a significant aneurysm. No change. 6. Ventricular tachycardia: VT storm in 12/17, may have been related to LV pacing.  Milrinone gtt started, would not use long-term due to risk of proarrhythmia.  - Continue amiodarone and mexiletine. No VT/VF by device interrogation.   Continue amio 200 mg bid with milrinone use.  - Recent LFTs and  TSH ok with amiodarone use. No change.  - Follow electrolytes closely. Keep K. 4.0 and Mg > 2.0  7. PTSD: Patient is on paroxetine due to significant anxiety around shocks.   - Stable. 8. Pulmonary HTN: Severe on RHC 03/06/2017.  Mixed pulmonary venous/pulmonary arterial HTN.  Improved on repeat RHC on 03/12/17.  - off sildenafil per  patient's request due to orthostasis.  9. AKI on CKD stage 3: Improved renal function after milrinone increased.  - Creatinine 1.9 >2.01.>1.67   - BMET in am.  10. LV thrombus: Noted on echo.  - Continue heparin drip.   Plan for VAD next week.   Length of Stay: 9  Amy Clegg, NP  03/03/2017 11:07 AM  Advanced Heart Failure Team Pager [phone] (M-F; 7a - 4p)  Please contact [facility] for night-coverage after hours (4p -7a ) and weekends on [email or web address]  Patient seen with NP, agree with the above note.    RHC was done yesterday, improved from prior but cardiac output still somewhat low and PCWP lower but still elevated.  Pulmonary artery pressure is lower.  He feels much better on milrinone, increased to 0.375 yesterday.  Good co-ox today at 63%, CVP 7-8.  He does not look volume overloaded on exam.  Of note, creatinine is improved today, down to 1.67.  - Restart Lasix 40 mg daily.  - Continue milrinone 0.375 - Plan for LVAD placement next Tuesday.  VAD coordinator to meet with daughter today.  Workup is completed.    No VT on telemetry, he remains on amiodarone and mexiletine.   Marca Ancona 03/03/2017 11:45 AM

## 2017-03-03 NOTE — Progress Notes (Signed)
VAD Coordinator continued pre-op LVAD teaching with patient. Pt has met with [name], [place] Coordinator and VAD evaluation consent reviewed and signed by pt. Initial VAD teaching completed with pt.   [name] is a 62 y.o. male whom  has a past medical history of Anxiety, Automatic implantable cardioverter-defibrillator in situ, CAD (coronary artery disease), Chronic systolic CHF (congestive heart failure), NYHA class 3 (HCC) - low output state (12/12/2014), Depression, Dyspnea, History of rheumatic fever (1962), HLD (hyperlipidemia) (mixed), HTN (hypertension), Ischemic cardiomyopathy, Myocardial infarction ([place]) (1998; 2002; ~ 2010), Paroxysmal ventricular tachycardia ([name]), PVC's (premature ventricular contractions), RBBB, and TIA (transient ischemic attack).. We have been asked to evaluate the patient for advanced therapies which include Left Ventricular Assist Device implantation.   Lab Results  Component Value Date   ABORH A NEG 02/25/2017    Lab Results  Component Value Date   HGBA1C 5.1 02/25/2017   Lab Results  Component Value Date   CREATININE 1.67 (H) 03/03/2017   CREATININE 2.01 (H) 03/01/2017   CREATININE 1.90 (H) 03/01/2017    VAD educational packet including "A Decision Aid for Left Ventricular Assist Device (LVAD) for Destination Therapy",  "Understanding Your Options with Advanced Heart Failure", "[facility] Patient Agreement for VAD Evaluation and Potential Implantation" consent, "[name] HM II Patient Education" booklet, and [employer] "Living a More Active Life" HM III booklet reviewed in detail with wife and left at bedside for continued reference. HM III Patient education DVD was given to patient with extra copies for his daughters. Explained that LVAD can be implanted for two indications in the setting of advanced left ventricular heart failure treatment:  Bridge to transplant - used for patients who cannot safely wait for heart transplant without this  device.  Or   Destination therapy - used for patients until end of life or recovery of heart function.  Discussed that at this point [name] would be considered for Destination Therapy due to recent smoking history should he be deemed an acceptable VAD candidate.   Provided brief equipment overview of the HeartMate II pump and discussed placement, surgical procedure, peripheral equipment, life-long coumadin therapy, importance of medication adherence and clinic follow up for as long as patient is living on support, life-style modifications, as well as need for caregiver to be successful with this therapy.   The patient and I discussed the process of the evaluation period and how a decision was made by the [facility] team whether he would be an appropriate candidate for therapy or not.  Caregiver Support: his two sisters identified as primary caregivers  Home Inspection Checklist: verified that patient has reliable telephone, running water and electricity in the home.   Pt voiced concern over the following issues: 1. Wants to be able to ride motorcycle after implant 2. Worries about pain management post op 3. Wants to know if LVAD will affect his disability status  Informed pt I would bring these issues to [facility] later today for team review. Pt verbalized understanding of same.   Advised the patient review the materials, contact either myself or anyone else on VAD team with any questions.    Follow-Up Plan:  1. Repeat RHC Tues 02/23/2017   Session Time: 60 mins

## 2017-03-03 NOTE — Progress Notes (Signed)
   03/03/17 1400  Clinical Encounter Type  Visited With Patient and family together  Visit Type Initial  Referral From Chaplain  Consult/Referral To Chaplain  Spiritual Encounters  Spiritual Needs Emotional  Stress Factors  Patient Stress Factors Exhausted     03/03/17 1400  Clinical Encounter Type  Visited With Patient and family together  Visit Type Initial  Referral From Chaplain  Consult/Referral To Chaplain  Spiritual Encounters  Spiritual Needs Emotional  Stress Factors  Patient Stress Factors Exhausted     Patient was in his bed, seemingly in emotional distress. One daughter on -site and waiting for two more daughters to arrive. Patient and family looking forward to a herrt procedure. Both were very receptive and appreciative of chaplain's visit.  Ricard Faulkner a Water quality scientistMusiko-Hol[place], [employer]. du PontChaplain

## 2017-03-03 NOTE — Progress Notes (Signed)
ANTICOAGULATION CONSULT NOTE - Follow Up Consult  Pharmacy Consult for heparin Indication: LV thrombus  Allergies  Allergen Reactions  . Corlanor [Ivabradine] Diarrhea    Patient Measurements: Height: 5\' 11"  (180.3 cm) Weight: 221 lb 8 oz (100.5 kg)(Simultaneous filing. User may not have seen previous data.) IBW/kg (Calculated) : 75.3 Heparin Dosing Weight: 95.9 kg   Vital Signs: Temp: 98.8 F (37.1 C) (01/09 0814) Temp Source: Oral (01/09 0814) BP: 107/61 (01/09 0814) Pulse Rate: 73 (01/09 0814)  Labs: Recent Labs    03/01/17 0430  02/27/2017 0500 03/24/2017 0800 03/03/17 0555 03/03/17 0556 03/03/17 0817  HGB  --   --  11.1*  --   --  10.1*  --   HCT  --   --  34.5*  --   --  31.7*  --   PLT  --   --  260  --   --  242  --   HEPARINUNFRC  --    < > 0.48  --  1.56*  --  0.34  CREATININE 1.90*  --   --  2.01*  --  1.67*  --    < > = values in this interval not displayed.    Estimated Creatinine Clearance: 54 mL/min (A) (by C-G formula based on SCr of 1.67 mg/dL (H)).   Medications:  Scheduled:  . amiodarone  200 mg Oral BID  . aspirin EC  81 mg Oral Daily  . atorvastatin  40 mg Oral Daily  . digoxin  125 mcg Oral QODAY  . famotidine  20 mg Oral BID  . feeding supplement (ENSURE ENLIVE)  237 mL Oral Q1500  . hydrocortisone cream   Topical TID  . magnesium oxide  400 mg Oral Daily  . mexiletine  200 mg Oral Q12H  . PARoxetine  20 mg Oral Daily  . sodium chloride flush  3 mL Intravenous Q12H  . sodium chloride flush  3 mL Intravenous Q12H  . spironolactone  12.5 mg Oral Daily  . tamsulosin  0.4 mg Oral Daily    Assessment: 61 yom admitted for increased SOB, abdominal swelling, and orthopnea with known history of CHF. Patient found to have suspected LV thrombus - pharmacy consulted to start heparin. Patient was not on anticoagulation PTA.   He is now s/p RHC and continues on IV heparin  Heparin level therapeutic this AM  Goal of Therapy:  Heparin level  0.3-0.7 units/ml Monitor platelets by anticoagulation protocol: Yes   Plan:  Continue heparin at 1550 units / hr Check anti-Xa level daily while on heparin Continue to monitor H&H and platelets  Thank you Okey RegalLisa Tyrae Alcoser, PharmD [phone]   03/03/2017 9:55 AM

## 2017-03-04 LAB — CBC WITH DIFFERENTIAL/PLATELET
Basophils Absolute: 0.1 10*3/uL (ref 0.0–0.1)
Basophils Relative: 1 %
Eosinophils Absolute: 0.3 10*3/uL (ref 0.0–0.7)
Eosinophils Relative: 5 %
HCT: 32.8 % — ABNORMAL LOW (ref 39.0–52.0)
Hemoglobin: 10.4 g/dL — ABNORMAL LOW (ref 13.0–17.0)
Lymphocytes Relative: 22 %
Lymphs Abs: 1.3 10*3/uL (ref 0.7–4.0)
MCH: 29.8 pg (ref 26.0–34.0)
MCHC: 31.7 g/dL (ref 30.0–36.0)
MCV: 94 fL (ref 78.0–100.0)
Monocytes Absolute: 0.7 10*3/uL (ref 0.1–1.0)
Monocytes Relative: 11 %
Neutro Abs: 3.8 10*3/uL (ref 1.7–7.7)
Neutrophils Relative %: 61 %
Platelets: 265 10*3/uL (ref 150–400)
RBC: 3.49 MIL/uL — ABNORMAL LOW (ref 4.22–5.81)
RDW: 14.5 % (ref 11.5–15.5)
WBC: 6.2 10*3/uL (ref 4.0–10.5)

## 2017-03-04 LAB — BASIC METABOLIC PANEL
Anion gap: 9 (ref 5–15)
BUN: 28 mg/dL — ABNORMAL HIGH (ref 6–20)
CO2: 26 mmol/L (ref 22–32)
Calcium: 9.5 mg/dL (ref 8.9–10.3)
Chloride: 99 mmol/L — ABNORMAL LOW (ref 101–111)
Creatinine, Ser: 2.02 mg/dL — ABNORMAL HIGH (ref 0.61–1.24)
GFR calc Af Amer: 38 mL/min — ABNORMAL LOW (ref >60)
GFR calc non Af Amer: 33 mL/min — ABNORMAL LOW (ref >60)
Glucose, Bld: 101 mg/dL — ABNORMAL HIGH (ref 65–99)
Potassium: 4.2 mmol/L (ref 3.5–5.1)
Sodium: 134 mmol/L — ABNORMAL LOW (ref 135–145)

## 2017-03-04 LAB — COOXEMETRY PANEL
Carboxyhemoglobin: 1.6 % — ABNORMAL HIGH (ref 0.5–1.5)
Methemoglobin: 1 % (ref 0.0–1.5)
O2 Saturation: 61.8 %
TOTAL HEMOGLOBIN: 10.8 g/dL — AB (ref 12.0–16.0)

## 2017-03-04 LAB — HEPARIN LEVEL (UNFRACTIONATED): Heparin Unfractionated: 0.44 IU/mL (ref 0.30–0.70)

## 2017-03-04 MED ORDER — MOMETASONE FURO-FORMOTEROL FUM 200-5 MCG/ACT IN AERO
2.0000 | INHALATION_SPRAY | Freq: Two times a day (BID) | RESPIRATORY_TRACT | Status: DC
  Administered 2017-03-05 – 2017-03-08 (×8): 2 via RESPIRATORY_TRACT
  Filled 2017-03-04: qty 8.8

## 2017-03-04 MED ORDER — FUROSEMIDE 10 MG/ML IJ SOLN
20.0000 mg | Freq: Once | INTRAMUSCULAR | Status: AC
  Administered 2017-03-04: 20 mg via INTRAVENOUS
  Filled 2017-03-04: qty 2

## 2017-03-04 NOTE — Progress Notes (Signed)
CARDIAC REHAB PHASE I   PRE:  Rate/Rhythm: paced 86  BP:  Supine:   Sitting: 127/68  Standing:    SaO2: 98%RA  MODE:  Ambulation: 965 ft 6 minute walk test  POST:  Rate/Rhythm: 103 paced  BP:  Supine:   Sitting: 130/70  Standing:    SaO2: 97%RA  1415-1450 Pt walked 965 ft independently with steady gait and no dizziness for 6 minute walk test. Slightly SOB but he walked at good pace. Tolerated well.  Luetta Nuttingharlene Naithen Rivenburg, RN BSN  03/04/2017 2:46 PM

## 2017-03-04 NOTE — Progress Notes (Signed)
Nutrition Follow-up  DOCUMENTATION CODES:   Obesity unspecified  INTERVENTION:    Continue Ensure Enlive po once daily, each supplement provides 350 kcal and 20 grams of protein  NUTRITION DIAGNOSIS:   Increased nutrient needs related to chronic illness as evidenced by estimated needs.  Ongoing  GOAL:   Patient will meet greater than or equal to 90% of their needs  Met with current PO intake  MONITOR:   PO intake, Supplement acceptance, Labs, Weight trends, Skin, I & O's  ASSESSMENT:   [name] is a 62 y.o. 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 He is admitted with CHF.   Patient continues on a heart healthy diet and Ensure Enlive supplement once daily. Patient is consuming 75-100% of meals and drinks Ensure supplement when offered.   Labs and medications reviewed. Plans for LVAD next week.  Diet Order:  Diet Heart Room service appropriate? Yes; Fluid consistency: Thin  EDUCATION NEEDS:   Not appropriate for education at this time  Skin:  Skin Assessment: Reviewed RN Assessment  Last BM:  1/9  Height:   Ht Readings from Last 1 Encounters:  02/08/2017 _0  (1.803 m)    Weight:   Wt Readings from Last 1 Encounters:  03/04/17 222 lb 14.4 oz (101.1 kg)    Ideal Body Weight:  78.1 kg  BMI:  Body mass index is 31.09 kg/m.  Estimated Nutritional Needs:   Kcal:  2200-2400  Protein:  110-125 gm  Fluid:  per MD   Molli Barrows, RD, LDN, [place] Pager [phone] After Hours Pager [phone]

## 2017-03-04 NOTE — Progress Notes (Signed)
Patient ID: [name], male   DOB: 12/24/1955, 62 y.o.   MRN: [id number]     Advanced Heart Failure Rounding Note  HF Cardiology: [name]   Subjective:      Has RUE PICC (Unable to placed tunneled PICC with R ICD and occluded left subclavian)  Remains on milrinone 0.375 mcg. CO-OX >60%. Yesterday po lasix started.   Denies SOB/CP.   [facility]  RA mean 9 RV 79/12 PA 85/36, mean 59 PCWP mean 29 Oxygen saturations: PA 51% AO 98% Cardiac Output (Fick) 3.95  Cardiac Index (Fick) 1.8 PVR 7.6 WU PAPi 5   RHC 03/02/2016  RHC Procedural Findings: On milrinone 0.25 mcg.  Hemodynamics (mmHg) RA mean 6 RV 67/8 PA 52/22, mean 36 PCWP mean 21 Oxygen saturations: PA 58% AO 99% Cardiac Output (Fick) 4.73  Cardiac Index (Fick) 2.15 PVR 3.1 WU Cardiac Output (Thermo) 4.54 Cardiac Index (Thermo) 2.06  PVR 3.3 WU  Echo (1/2): EF 20-25% with severely dilated LV, severe RV dilation with mild to moderately decreased systolic function, moderate MR, moderate AI. Suspect LV thrombus.   Objective:   Weight Range: 222 lb 14.4 oz (101.1 kg) Body mass index is 31.09 kg/m.   Vital Signs:   Temp:  [97.4 F (36.3 C)-98.2 F (36.8 C)] 97.8 F (36.6 C) (01/10 0849) Pulse Rate:  [73-84] 79 (01/10 0849) Resp:  [18] 18 (01/09 1805) BP: (111-132)/(56-65) 132/65 (01/10 0849) SpO2:  [94 %-98 %] 94 % (01/10 0533) Weight:  [222 lb 14.4 oz (101.1 kg)] 222 lb 14.4 oz (101.1 kg) (01/10 0536) Last BM Date: 03/03/17  Weight change: Filed Weights   02/27/2017 0608 03/03/17 0546 03/04/17 0536  Weight: 220 lb 14.4 oz (100.2 kg) 221 lb 8 oz (100.5 kg) 222 lb 14.4 oz (101.1 kg)    Intake/Output:   Intake/Output Summary (Last 24 hours) at 03/04/2017 1053 Last data filed at 03/04/2017 1000 Gross per 24 hour  Intake 2229.29 ml  Output 2645 ml  Net -415.71 ml     Physical Exam  CVP 2-3  General:  Sitting in the chair.  No resp difficulty HEENT: normal Neck: supple. no JVD.  Carotids 2+ bilat; no bruits. No lymphadenopathy or thryomegaly appreciated. Cor: PMI nondisplaced. Regular rate & rhythm. No rubs, or murmurs. + S3  Lungs: clear Abdomen: soft, nontender, nondistended. No hepatosplenomegaly. No bruits or masses. Good bowel sounds. Extremities: no cyanosis, clubbing, rash, edema. RUE PICC  Neuro: alert & orientedx3, cranial nerves grossly intact. moves all 4 extremities w/o difficulty. Affect pleasant    Telemetry   A sensed V paced 90s  Labs    CBC Recent Labs    03/03/17 0556 03/04/17 0542  WBC 6.1 6.2  NEUTROABS 4.2 3.8  HGB 10.1* 10.4*  HCT 31.7* 32.8*  MCV 93.2 94.0  PLT 242 265   Basic Metabolic Panel Recent Labs    40/98/1099/11/11 0556 03/04/17 0542  NA 131* 134*  K 4.5 4.2  CL 98* 99*  CO2 25 26  GLUCOSE 136* 101*  BUN 23* 28*  CREATININE 1.67* 2.02*  CALCIUM 8.8* 9.5   Liver Function Tests No results for input(s): AST, ALT, ALKPHOS, BILITOT, PROT, ALBUMIN in the last 72 hours. No results for input(s): LIPASE, AMYLASE in the last 72 hours. Cardiac Enzymes No results for input(s): CKTOTAL, CKMB, CKMBINDEX, TROPONINI in the last 72 hours.  BNP: BNP (last 3 results) Recent Labs    10/07/16 2103 01/23/2017 1331 02/14/2017 1613  BNP 809.6* 953.4*  1,016.9*    ProBNP (last 3 results) No results for input(s): PROBNP in the last 8760 hours.   D-Dimer No results for input(s): DDIMER in the last 72 hours. Hemoglobin A1C No results for input(s): HGBA1C in the last 72 hours. Fasting Lipid Panel No results for input(s): CHOL, HDL, LDLCALC, TRIG, CHOLHDL, LDLDIRECT in the last 72 hours. Thyroid Function Tests No results for input(s): TSH, T4TOTAL, T3FREE, THYROIDAB in the last 72 hours.  Invalid input(s): FREET3  Other results:   Imaging   No results found.  Medications:     Scheduled Medications: . amiodarone  200 mg Oral BID  . aspirin EC  81 mg Oral Daily  . atorvastatin  40 mg Oral Daily  . digoxin  125 mcg Oral  QODAY  . famotidine  20 mg Oral BID  . feeding supplement (ENSURE ENLIVE)  237 mL Oral Q1500  . hydrocortisone cream   Topical TID  . magnesium oxide  400 mg Oral Daily  . mexiletine  200 mg Oral Q12H  . PARoxetine  20 mg Oral Daily  . sodium chloride flush  3 mL Intravenous Q12H  . sodium chloride flush  3 mL Intravenous Q12H  . spironolactone  12.5 mg Oral Daily  . tamsulosin  0.4 mg Oral Daily    Infusions: . sodium chloride Stopped (02/25/17 1741)  . sodium chloride    . heparin 1,550 Units/hr (03/04/17 0851)  . milrinone 0.375 mcg/kg/min (03/04/17 0851)    PRN Medications: sodium chloride, sodium chloride, acetaminophen, ALPRAZolam, docusate sodium, ondansetron (ZOFRAN) IV, sodium chloride flush, sodium chloride flush, traMADol, triamcinolone cream   Assessment/Plan   1. Acute on chronic systolic CHF: Ischemic cardiomyopathy. With low cardiac output noted on during admission in 10/17. Had upgrade to Medtronic BiV ICD in 10/17. However, with VT storm in 12/17, the LV lead was turned off due to concerns for pro-arrhythmia.  In 1/18, he developed a device infection and the BiV-ICD was removed and later replaced with a Medtronic ICD with His bundle pacing.  RHC (1/18) with preserved cardiac output.  TEE (1/18) with EF 15%.  CPX in 5/18 with moderate HF limitation.  Most recent echo in 03/21/2017 with EF 20-25%.  The RV was severely dilated but mild to moderately hypokinetic. Most recently, he lost capture of His bundle lead and AV delay was increased to limit RV pacing.  EP restarted His bundle pacing with maximal output to attain capture.  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 Low cardiac index 1.8 on RHC, milrinone begun (he was His pacing at time of RHC).  He was admitted from clinic with NYHA class IV symptoms.  - Milrinone increased to 0.375 mcg after cath. Co-ox 62%.  - Volume status low. CVP 1. Hold lasix.  - Did not tolerate  Entresto due to orthostasis, has been taken off losartan with elevated creatinine and low BP.  - Continue spironolactone 12.5 mg daily (did not tolerate increase due to abdominal discomfort).  - Continue digoxin 0.125 mg every other day. Level ok this admit.  - He is off bisoprolol, will leave off for now with low output.  - He did not tolerate Corlanor due to diarrhea.  - Plan for VAD next week.  Dr Maren Beach following.   2.  CAD: LHC in 12/17 with stable coronary disease.   - No S/S ischemia.  No CP.  - Continue ASA, Plavix, statin.  Good lipids 10/18. 3. Smoking: has stopped smoking  since 2/18.  4. 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 Suspect this contributed to HF with loss of AV synchrony (and also was bradycardic). 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  He was BiV paced after 10/17 admission, but LV lead was turned off due to concern for pro-arrhythmia.  He now has a Medtronic ICD and had been His bundle pacing, but lost capture in His lead and was RV pacing prior to admission.   - See above discussion. EP has seen. His Bundle pacing turned to max output but this may not last long before capture lost fully. No change. -Dr Shirlee Latch discussed with EP.  5. Ascending aortic aneurysm: CTA chest in 8/18 actually did not show a significant aneurysm. No change. 6. Ventricular tachycardia: VT storm in 12/17, may have been related to LV pacing.  Milrinone gtt started, would not use long-term due to risk of proarrhythmia.  - Continue amiodarone and mexiletine. No VT/VF by device interrogation.   Continue amio 200 mg bid with milrinone use.  - Recent LFTs and TSH ok with amiodarone use. No change.  Keep K. 4.0 and Mg > 2.0  7. PTSD: Patient is on paroxetine due to significant anxiety around shocks.   - Stable. 8. 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  Improved on repeat RHC on 03/12/17.  - off sildenafil per  patient's request  due to orthostasis.  9. AKI on CKD stage 3: Improved renal function after milrinone increased.  - Creatinine 1.9 >2.01.>1.67>2.0   10. LV thrombus: Noted on echo.  - Continue heparin drip.   Plan for VAD next week.   Length of Stay: 10  Tonye Becket, NP  03/04/2017 10:53 AM  Advanced Heart Failure Team Pager [phone] (M-F; 7a - 4p)  Please contact [facility] for night-coverage after hours (4p -7a ) and weekends on [email or web address]  Patient seen with NP, agree with the above note.  He feels a lot better on milrinone. Co-ox 62% with CVP very low at 1. Creatinine up to 2 today in setting of low CVP.   - Continue milrinone 0.375 - Stop Lasix (no Lasix today).  Follow creatinine closely, hopefully will trend down off Lasix.   Heparin gtt ongoing with LV thrombus.   Plan for LVAD next week. Ongoing teaching with family.    Marca Ancona 03/04/2017 12:05 PM

## 2017-03-04 NOTE — Progress Notes (Signed)
Spoke with patient today re" planned 6 min walk prior to VAD implant.   Pt has no questions following VAD education yesterday evening with family.    VAD patient scheduled to come by and meet with patient later this am to demonstrate HM III equipment. Pt verbalized agreement to same.

## 2017-03-04 NOTE — Progress Notes (Addendum)
Patient called RN stating, " I don't feel good. I feel very bloated and short of breath." RN to bedside for assessment. Found to have crackles in lung bases, CVP 8 to 9, second CVP 9 to 10 (checked by two RN's). BP 130/61 HR ~70, Oxygen stats 100%. Paged Cardiology Bhagat awaiting call back. I will continue to monitor patient closely and follow up with night shift RN.   Sheppard Evensina Addelyn Alleman RN   Zenaida NieceVan Trigt came to bedside and placed orders with night shift RN.

## 2017-03-04 NOTE — Progress Notes (Signed)
Pre-VAD Education:  Met with patient and 3 daughters Oran Rein, April Cheek, and Torrez Renfroe).  Equipment demonstration with HM III training loop included the following:   a) power module  b) system controller  c) universal [employer] fundraiser  d) battery clips  e) Batteries  f)  Perc lock  g) Percutaneous lead   Demonstrated:  a) changing power source on system controller from tethered (power module) to untethered (battery) mode  b) changing power source on system controller from untethered (battery) to tethered (power module) mode  c) how to monitor battery life both on the system controller and on each individual battery  d) changing batteries   Stressed importance of never disconnecting power from both controller power leads at the same time, and never disconnecting both batteries at the same time, or the pump will alarm.   Stressed importance of performing home inspection and assuring that only three pronged grounded power outlets can be used for VAD equipment. Patient confirmed home has electrical outlets that will support VAD equipment when patient discharged home.   Identified the following changes in activities of daily living with pump:  1. No driving for at least three months and then only if doctor gives permission to do so.  2. No tub baths while pump implanted, and shower only if doctor gives permission.  3. No swimming or submersion in water while implanted with pump.  4. No contact sports or engaging in jumping activities.  5. Always have a backup controller, charged spare batteries, and battery clips nearby at all times in case of emergency.  6. Call the doctor or hospital contact person if any change in how the pump sounds, feels, or works.  7. Keep a backup system controller, charged batteries, battery clips, and flashlight near you during sleep in case of electrical power outage.  10. Exit site care including dressing changes, monitoring for infection, and importance  of keeping percutaneous lead stabilized at all times.   Also discussed need for 24 hour/7 day week caregivers; pt designated several family members including daughters and two sisters Marlowe Kays and Flowood). Stressed importance of designating 2 - 3 people to perform dressing changes to keep consistency with wound care.   Patient has met with VAD pt (HVAD pt), but will plan on asking another patient with HM III to come by and meet Mr. Juenger tomorrow.    Reviewed pictures of VAD drive line, site care, dressing changes, and drive line stabilization including securement attachment device and abdominal binder. Discussed with pt and family that they will be required to purchase dressing supplies as long as patient has the VAD in place.   Explained bridge to recovery versus bridge to transplant; pt and family expressed understanding of both and all voiced agreement to proceed with VAD implant acknowledging that Destination Therapy could become bridge to transplant. Pt would have to be nicotine free for 6 mos prior to qualifying for transplant. Daughters stressed importance to their dad to stop smoking. Pt and family asked appropriate questions, had good interaction with VAD coordinator, and verbalized understanding of above.   Also discussed need for 24 hour/7 day week caregivers; pt designated sisters and daughters as caregivers. Pt will need weekly clinic visits at discharge, frequent labs; also he will be on sternal precautions with no lifting, pushing, pulling and he will need assistance with adapting to new life style with VAD equipment and care. '

## 2017-03-04 NOTE — Progress Notes (Signed)
ANTICOAGULATION CONSULT NOTE - Follow Up Consult  Pharmacy Consult for heparin Indication: LV thrombus  Allergies  Allergen Reactions  . Corlanor [Ivabradine] Diarrhea    Patient Measurements: Height: 5\' 11"  (180.3 cm) Weight: 222 lb 14.4 oz (101.1 kg) IBW/kg (Calculated) : 75.3 Heparin Dosing Weight: 95.9 kg   Vital Signs: Temp: 98.1 F (36.7 C) (01/10 1211) Temp Source: Oral (01/10 1211) BP: 124/66 (01/10 1211) Pulse Rate: 72 (01/10 1211)  Labs: Recent Labs    03/01/2017 0500 03/11/2017 0800 03/03/17 0555 03/03/17 0556 03/03/17 0817 03/04/17 0542  HGB 11.1*  --   --  10.1*  --  10.4*  HCT 34.5*  --   --  31.7*  --  32.8*  PLT 260  --   --  242  --  265  HEPARINUNFRC 0.48  --  1.56*  --  0.34 0.44  CREATININE  --  2.01*  --  1.67*  --  2.02*    Estimated Creatinine Clearance: 44.7 mL/min (A) (by C-G formula based on SCr of 2.02 mg/dL (H)).  Medications: Heparin @ 1550 units/hr  Assessment: 61 yom admitted for increased SOB, abdominal swelling, and orthopnea with known history of CHF. Patient found to have suspected LV thrombus - pharmacy consulted to start heparin. Patient was not on anticoagulation PTA.   He is now s/p RHC and continues on IV heparin awaiting LVAD next Tuesday. Heparin level is therapeutic at 0.44. CBC stable. No bleeding reported.  Goal of Therapy:  Heparin level 0.3-0.7 units/ml Monitor platelets by anticoagulation protocol: Yes   Plan:  1) Continue heparin at 1550 units/hr 2) Daily heparin level and CBC   Louie CasaJennifer Ezariah Nace, PharmD, BCPS  03/04/2017 1:50 PM

## 2017-03-04 NOTE — Progress Notes (Signed)
Patient with complaints of abdominal bloating and "feeling like [he] felt when [he] came in to the hospital". Crackles in lung bases ausculted. CVP measured at 1915 between 10 and 11. Dr. Donata ClayVan Trigt at bedside during assessment. Orders for IV lasixs, incentive spirometry, and breathing treatments received.   Jule SerB. Novice Vrba RN

## 2017-03-05 LAB — BASIC METABOLIC PANEL
Anion gap: 7 (ref 5–15)
BUN: 31 mg/dL — ABNORMAL HIGH (ref 6–20)
CALCIUM: 9.2 mg/dL (ref 8.9–10.3)
CO2: 24 mmol/L (ref 22–32)
CREATININE: 1.8 mg/dL — AB (ref 0.61–1.24)
Chloride: 102 mmol/L (ref 101–111)
GFR calc non Af Amer: 38 mL/min — ABNORMAL LOW (ref >60)
GFR, EST AFRICAN AMERICAN: 44 mL/min — AB (ref >60)
Glucose, Bld: 142 mg/dL — ABNORMAL HIGH (ref 65–99)
Potassium: 4 mmol/L (ref 3.5–5.1)
SODIUM: 133 mmol/L — AB (ref 135–145)

## 2017-03-05 LAB — COOXEMETRY PANEL
Carboxyhemoglobin: 1.9 % — ABNORMAL HIGH (ref 0.5–1.5)
Methemoglobin: 0.9 % (ref 0.0–1.5)
O2 SAT: 75.7 %
Total hemoglobin: 10.4 g/dL — ABNORMAL LOW (ref 12.0–16.0)

## 2017-03-05 LAB — CBC WITH DIFFERENTIAL/PLATELET
BASOS PCT: 1 %
Basophils Absolute: 0 10*3/uL (ref 0.0–0.1)
EOS ABS: 0.3 10*3/uL (ref 0.0–0.7)
EOS PCT: 5 %
HCT: 31.7 % — ABNORMAL LOW (ref 39.0–52.0)
Hemoglobin: 9.7 g/dL — ABNORMAL LOW (ref 13.0–17.0)
Lymphocytes Relative: 19 %
Lymphs Abs: 1.2 10*3/uL (ref 0.7–4.0)
MCH: 28.8 pg (ref 26.0–34.0)
MCHC: 30.6 g/dL (ref 30.0–36.0)
MCV: 94.1 fL (ref 78.0–100.0)
MONO ABS: 0.8 10*3/uL (ref 0.1–1.0)
MONOS PCT: 13 %
Neutro Abs: 3.9 10*3/uL (ref 1.7–7.7)
Neutrophils Relative %: 62 %
Platelets: 238 10*3/uL (ref 150–400)
RBC: 3.37 MIL/uL — ABNORMAL LOW (ref 4.22–5.81)
RDW: 14.6 % (ref 11.5–15.5)
WBC: 6.2 10*3/uL (ref 4.0–10.5)

## 2017-03-05 LAB — HEPARIN LEVEL (UNFRACTIONATED)
Heparin Unfractionated: 1.4 IU/mL — ABNORMAL HIGH (ref 0.30–0.70)
Heparin Unfractionated: 0.35 IU/mL (ref 0.30–0.70)

## 2017-03-05 MED ORDER — AMIODARONE HCL 200 MG PO TABS
200.0000 mg | ORAL_TABLET | Freq: Every day | ORAL | Status: DC
  Administered 2017-03-06 – 2017-03-08 (×3): 200 mg via ORAL
  Filled 2017-03-05 (×3): qty 1

## 2017-03-05 NOTE — Progress Notes (Signed)
Patient ID: [name], male   DOB: 12/15/1955, 62 y.o.   MRN: [id number]     Advanced Heart Failure Rounding Note  HF Cardiology: Shirlee Latch   Subjective:      Has RUE PICC (Unable to placed tunneled PICC with R ICD and occluded left subclavian)  Remains on milrinone 0.375 mcg. CO-OX >75%. Lasix held yesterday with CVP 1. Improved to ~3-4 today.   Denies SOB/CP. Just walked the halls without dizziness or lightheadedness.   [facility] 03/17/2017 No milrinone.  RA mean 9 RV 79/12 PA 85/36, mean 59 PCWP mean 29 Oxygen saturations: PA 51% AO 98% Cardiac Output (Fick) 3.95  Cardiac Index (Fick) 1.8 PVR 7.6 WU PAPi 5   RHC 03/02/2016  RHC Procedural Findings: On milrinone 0.25 mcg.  Hemodynamics (mmHg) RA mean 6 RV 67/8 PA 52/22, mean 36 PCWP mean 21 Oxygen saturations: PA 58% AO 99% Cardiac Output (Fick) 4.73  Cardiac Index (Fick) 2.15 PVR 3.1 WU Cardiac Output (Thermo) 4.54 Cardiac Index (Thermo) 2.06  PVR 3.3 WU  Echo (1/2): EF 20-25% with severely dilated LV, severe RV dilation with mild to moderately decreased systolic function, moderate MR, moderate AI. Suspect LV thrombus.   Objective:   Weight Range: 222 lb 6.4 oz (100.9 kg) Body mass index is 31.02 kg/m.   Vital Signs:   Temp:  [97.3 F (36.3 C)-98.3 F (36.8 C)] 97.8 F (36.6 C) (01/11 0500) Pulse Rate:  [72-79] 72 (01/11 0500) Resp:  [18] 18 (01/11 0500) BP: (108-133)/(55-71) 133/57 (01/11 0500) SpO2:  [95 %-100 %] 98 % (01/11 0500) Weight:  [222 lb 6.4 oz (100.9 kg)] 222 lb 6.4 oz (100.9 kg) (01/11 0500) Last BM Date: 03/04/17  Weight change: Filed Weights   03/03/17 0546 03/04/17 0536 03/05/17 0500  Weight: 221 lb 8 oz (100.5 kg) 222 lb 14.4 oz (101.1 kg) 222 lb 6.4 oz (100.9 kg)   Intake/Output:   Intake/Output Summary (Last 24 hours) at 03/05/2017 0810 Last data filed at 03/05/2017 0551 Gross per 24 hour  Intake 2028.81 ml  Output 2750 ml  Net -721.19 ml     Physical Exam  CVP 3-4  General:  Seated in chair. NAD.  HEENT: Normal Neck: Supple. JVP flat. Carotids 2+ bilat; no bruits. No thyromegaly or nodule noted. Cor: PMI nondisplaced. RRR, No M/G/R noted. +S3. Lungs: CTAB, normal effort. Abdomen: Soft, non-tender, non-distended, no HSM. No bruits or masses. +BS  Extremities: No cyanosis, clubbing, or rash. R and LLE no edema. RUE PICC Neuro: Alert & orientedx3, cranial nerves grossly intact. moves all 4 extremities w/o difficulty. Affect pleasant   Telemetry   A sensed V paced 70s, personally reviewed.   Labs    CBC Recent Labs    03/04/17 0542 03/05/17 0543  WBC 6.2 6.2  NEUTROABS 3.8 3.9  HGB 10.4* 9.7*  HCT 32.8* 31.7*  MCV 94.0 94.1  PLT 265 238   Basic Metabolic Panel Recent Labs    40/98/11 0542 03/05/17 0543  NA 134* 133*  K 4.2 4.0  CL 99* 102  CO2 26 24  GLUCOSE 101* 142*  BUN 28* 31*  CREATININE 2.02* 1.80*  CALCIUM 9.5 9.2   Liver Function Tests No results for input(s): AST, ALT, ALKPHOS, BILITOT, PROT, ALBUMIN in the last 72 hours. No results for input(s): LIPASE, AMYLASE in the last 72 hours. Cardiac Enzymes No results for input(s): CKTOTAL, CKMB, CKMBINDEX, TROPONINI in the last 72 hours.  BNP: BNP (last 3 results) Recent Labs  10/07/16 2103 02/17/2017 1331 02/16/2017 1613  BNP 809.6* 953.4* 1,016.9*    ProBNP (last 3 results) No results for input(s): PROBNP in the last 8760 hours.   D-Dimer No results for input(s): DDIMER in the last 72 hours. Hemoglobin A1C No results for input(s): HGBA1C in the last 72 hours. Fasting Lipid Panel No results for input(s): CHOL, HDL, LDLCALC, TRIG, CHOLHDL, LDLDIRECT in the last 72 hours. Thyroid Function Tests No results for input(s): TSH, T4TOTAL, T3FREE, THYROIDAB in the last 72 hours.  Invalid input(s): FREET3  Other results:   Imaging   No results found.  Medications:     Scheduled Medications: . amiodarone  200 mg Oral BID  . aspirin EC  81 mg Oral Daily  .  atorvastatin  40 mg Oral Daily  . digoxin  125 mcg Oral QODAY  . famotidine  20 mg Oral BID  . feeding supplement (ENSURE ENLIVE)  237 mL Oral Q1500  . hydrocortisone cream   Topical TID  . magnesium oxide  400 mg Oral Daily  . mexiletine  200 mg Oral Q12H  . mometasone-formoterol  2 puff Inhalation BID  . PARoxetine  20 mg Oral Daily  . sodium chloride flush  3 mL Intravenous Q12H  . sodium chloride flush  3 mL Intravenous Q12H  . spironolactone  12.5 mg Oral Daily  . tamsulosin  0.4 mg Oral Daily    Infusions: . sodium chloride Stopped (02/25/17 1741)  . sodium chloride    . heparin 1,550 Units/hr (03/05/17 0037)  . milrinone 0.375 mcg/kg/min (03/05/17 0037)    PRN Medications: sodium chloride, sodium chloride, acetaminophen, ALPRAZolam, docusate sodium, ondansetron (ZOFRAN) IV, sodium chloride flush, sodium chloride flush, traMADol, triamcinolone cream   Assessment/Plan   1. Acute on chronic systolic CHF: Ischemic cardiomyopathy. With low cardiac output noted on during admission in 10/17. Had upgrade to Medtronic BiV ICD in 10/17. However, with VT storm in 12/17, the LV lead was turned off due to concerns for pro-arrhythmia.  In 1/18, he developed a device infection and the BiV-ICD was removed and later replaced with a Medtronic ICD with His bundle pacing.  RHC (1/18) with preserved cardiac output.  TEE (1/18) with EF 15%.  CPX in 5/18 with moderate HF limitation.  Most recent echo in 03/21/2017 with EF 20-25%.  The RV was severely dilated but mild to moderately hypokinetic. Most recently, he lost capture of His bundle lead and AV delay was increased to limit RV pacing.  EP restarted His bundle pacing with maximal output to attain capture.  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 Low cardiac index 1.8 on RHC, milrinone begun (he was His pacing at time of RHC).  He was admitted from clinic with NYHA class IV symptoms.  - Milrinone  increased to 0.375 mcg after cath. Co-ox 75% this am - Volume status remains low, but improved from yesterday. CVP 3-4.  - Lasix on hold.   - Did not tolerate Entresto due to orthostasis, has been taken off losartan with elevated creatinine and low BP.  - Continue spironolactone 12.5 mg daily (did not tolerate increase due to abdominal discomfort).  - Continue digoxin 0.125 mg every other day. Level ok this admit.  - He is off bisoprolol, will leave off for now with low output.  - He did not tolerate Corlanor due to diarrhea.  - Plan for VAD next week. 03/01/2017 - Dr Maren BeachVantrigt following.   2.  CAD: LHC in  12/17 with stable coronary disease.   - No s/s of ischemia. No CP.  - Continue ASA, Plavix, statin.  Good lipids 10/18. 3. Smoking: has stopped smoking since 2/18.  4. 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 Suspect this contributed to HF with loss of AV synchrony (and also was bradycardic). 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  He was BiV paced after 10/17 admission, but LV lead was turned off due to concern for pro-arrhythmia.  He now has a Medtronic ICD and had been His bundle pacing, but lost capture in His lead and was RV pacing prior to admission.   - See above discussion. EP has seen. His Bundle pacing turned to max output but this may not last long before capture lost fully. No change -Dr Shirlee Latch discussed with EP.  5. Ascending aortic aneurysm: CTA chest in 8/18 actually did not show a significant aneurysm. No change. 6. Ventricular tachycardia: VT storm in 12/17, may have been related to LV pacing.  Milrinone gtt started, would not use long-term due to risk of proarrhythmia.  - Continue amiodarone and mexiletine. No VT/VF by device interrogation.   - Continue amio 200 mg bid with milrinone use. No VT/VF this admit.  - Recent LFTs and TSH ok with amiodarone use. No change.  - Keep K. 4.0 and Mg > 2.0  7. PTSD: Patient is on paroxetine due to significant anxiety  around shocks.   - Stable. 8. 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  Mixed pulmonary venous/pulmonary arterial HTN.  Improved on repeat RHC on 03/12/17.  - off sildenafil per  patient's request due to orthostasis. No change.  9. AKI on CKD stage 3: Improved renal function after milrinone increased.  - Creatinine 1.9 >2.01.>1.67>2.0  -> 1.8. 10. LV thrombus: Noted on echo.  - Continue heparin drip. No change.   Plan for VAD early week. Will not aggressively titrate medications, but try and stabilize renal function. Holding lasix for at least today again  Length of Stay: [address]  Luane School  03/05/2017 8:10 AM  Advanced Heart Failure Team Pager [phone] (M-F; 7a - 4p)  Please contact [facility] for night-coverage after hours (4p -7a ) and weekends on [email or web address]  Patient seen with PA, agree with the above note.  He feels much better on milrinone.  Creatinine down to 1.8.  Will continue to hold Lasix with CVP 3-4.  Plan at this time for LVAD on Tuesday.  He will eventually be a transplant candidate if he stops smoking, discussed with [facility].   Marca Ancona 03/05/2017 1:49 PM

## 2017-03-05 NOTE — Progress Notes (Signed)
ANTICOAGULATION CONSULT NOTE - Follow Up Consult  Pharmacy Consult for heparin Indication: LV thrombus  Allergies  Allergen Reactions  . Corlanor [Ivabradine] Diarrhea    Patient Measurements: Height: 5\' 11"  (180.3 cm) Weight: 222 lb 6.4 oz (100.9 kg) IBW/kg (Calculated) : 75.3 Heparin Dosing Weight: 95.9 kg   Vital Signs: Temp: 98.3 F (36.8 C) (01/11 1252) Temp Source: Oral (01/11 1252) BP: 119/61 (01/11 1252) Pulse Rate: 74 (01/11 1252)  Labs: Recent Labs    03/03/17 0556  03/04/17 0542 03/05/17 0543 03/05/17 0858  HGB 10.1*  --  10.4* 9.7*  --   HCT 31.7*  --  32.8* 31.7*  --   PLT 242  --  265 238  --   HEPARINUNFRC  --    < > 0.44 1.40* 0.35  CREATININE 1.67*  --  2.02* 1.80*  --    < > = values in this interval not displayed.    Estimated Creatinine Clearance: 50.1 mL/min (A) (by C-G formula based on SCr of 1.8 mg/dL (H)).  Medications: Heparin @ 1550 units/hr  Assessment: 61 yom admitted for increased SOB, abdominal swelling, and orthopnea with known history of CHF. Patient found to have suspected LV thrombus - pharmacy consulted to start heparin. Patient was not on anticoagulation PTA.   He is now s/p RHC and continues on IV heparin awaiting LVAD next Tuesday. Heparin level 1.4 drawn incorrectly. Repeat is therapeutic at 0.35. CBC stable. No bleeding reported.  Goal of Therapy:  Heparin level 0.3-0.7 units/ml Monitor platelets by anticoagulation protocol: Yes   Plan:  1) Continue heparin at 1550 units/hr 2) Daily heparin level and CBC   Louie CasaJennifer Nohemi Nicklaus, PharmD, BCPS  03/05/2017 2:09 PM

## 2017-03-05 NOTE — Progress Notes (Addendum)
[name] has been discussed with the VAD Medical Review board on 03/01/17. The team feels as if the patient is a good candidate for Destination LVAD therapy because of his overall decline in functional status matched by severe hemodynamic deterioration.Pt also has recent smoking history. The patient meets criteria for a LVAD implant as listed below:   1)  Has failed to respond to optimal medical management (including beta-blockers and ACE inhibitors if tolerated) for at least 45 of the last 60 days, or have been balloon dependent for 7 days, or IV inotrope dependent for 14 days; and,       *On Inotropes Mirinone started 03/04/2017  2)  Has a left ventricular ejection fraction (LVEF) < 25% and, have demonstrated functional limitation with a peak oxygen consumption of <14 ml/kg/min unless balloon pump or inotrope dependent or physically unable to perform the test.         *EF 20 - 25%  By echo on 02/23/2017            *CPX - not done due to patient being on Milrinone  3)  Social work and palliative care evaluations demonstrate appropriate support system in place for discharge to home with a VAD and that end of life discussions have taken place. Both services have expressed no concern regarding patient's candidacy.         *Social work consult:  Raquel Sarna, LSW on 03/01/17        *Palliative Care Consult: Vinie Sill, NP on 10/01/36  4)  Primary caretaker identified that can be taught along with the patient how to manage        the VAD equipment.        *Name: Helaine Chess and Marlowe Kays; also has 3 daughters that will be helping  5)  Deemed appropriate by our financial coordinator:  03/01/17        Prior approval: not required; pt has Medicaid  6)  VAD Coordinator, Zada Girt has met with patient and caregiver, shown them the VAD equipment and discussed with the patient and caregiver about lifestyle changes necessary for success on mechanical circulatory device.        *Met with VAD  Coordinators:   Balinda Quails on 03/14/2017  Zada Girt 03/01/17 and 03/03/17        *Consent for VAD Evaluation/Caregiver Agreement/HIPPA Release/Photo Release signed on 03/05/2017  7)  Patient has been discussed with [place] (Dr. Melvyn Novas) per Dr. Aundra Dubin and felt to be an appropriate candidate.    8)  Intermacs profile: 2  INTERMACS 1: Critical cardiogenic shock describes a patient who is "crashing and burning", in which a patient has life-threatening hypotension and rapidly escalating inotropic pressor support, with critical organ hypoperfusion often confirmed by worsening acidosis and lactate levels.  INTERMACS 2: Progressive decline describes a patient who has been demonstrated "dependent" on inotropic support but nonetheless shows signs of continuing deterioration in nutrition, renal function, fluid retention, or other major status indicator. Patient profile 2 can also describe a patient with refractory volume overload, perhaps with evidence of impaired perfusion, in whom inotropic infusions cannot be maintained due to tachyarrhythmias, clinical ischemia, or other intolerance.  INTERMACS 3: Stable but inotrope dependent describes a patient who is clinically stable on mild-moderate doses of intravenous inotropes (or has a temporary circulatory support device) after repeated documentation of failure to wean without symptomatic hypotension, worsening symptoms, or progressive organ dysfunction (usually renal). It is critical to monitor nutrition, renal function, fluid balance, and  overall status carefully in order to distinguish between a  patient who is truly stable at Patient Profile 3 and a patient who has unappreciated decline rendering them Patient Profile 2. This patient may be either at home or in the hospital.   INTERMACS 4: Resting symptoms describes a patient who is at home on oral therapy but frequently has symptoms of congestion at rest or with activities of daily living (ADL). He or she  may have orthopnea, shortness of breath during ADL such as dressing or bathing, gastrointestinal symptoms (abdominal discomfort, nausea, poor appetite), disabling ascites or severe lower extremity edema. This patient should be carefully considered for more intensive management and surveillance programs, which may in some cases, reveal poor compliance that would compromise outcomes with any therapy.  .  INTERMACS 5: Exertion Intolerant describes a patient who is comfortable at rest but unable to engage in any activity, living predominantly within the house or housebound. This patient has no congestive symptoms, but may have chronically elevated volume status, frequently with renal dysfunction, and may be characterized as exercise intolerant.   INTERMACS 6: Exertion Limited also describes a patient who is comfortable at rest without evidence of fluid overload, but who is able to do some mild activity. Activities of daily living are comfortable and minor activities outside the home such as visiting friends or going to a restaurant can be performed, but fatigue results within a few minutes of any meaningful physical exertion. This patient has occasional episodes of worsening symptoms and is likely to have had a hospitalization for heart failure within the past year.   INTERMACS 7: Advanced NYHA Class 3 describes a patient who is clinically stable with a reasonable level of comfortable activity, despite history of previous decompensation that is not recent. This patient is usually able to walk more than a block. Any decompensation requiring intravenous diuretics or hospitalization within the previous month should make this person a Patient Profile 6 or lower.    9)  NYHA Class: IV

## 2017-03-05 NOTE — Progress Notes (Signed)
VAD coordinator met with pt to complete Intermacs prior to VAD implant scheduled for next Tuesday.   Pt met with VAD patient yesterday and had all questions answered; pt has no further questions for me at this time.

## 2017-03-05 NOTE — Care Management Note (Signed)
Case Management Note  Patient Details  Name: [name] MRN: [id number] Date of Birth: 03/22/1955  Subjective/Objective: Pt presented for Acute on Chronic CHF. Initiated on IV Milrinone and remains on Milrinone. Plan for LVAD 03/13/2017. PTA from home with the support of sister and he has 3 daughters to assist. Pt uses [facility].                  Action/Plan: [facility] to monitor the pt outpatient for community resources. CM will continue to monitor for additional needs.   Expected Discharge Date:                  Expected Discharge Plan:  Home w Home Health Services  In-House Referral:     Discharge planning Services  CM Consult  Post Acute Care Choice:    Choice offered to:     DME Arranged:    DME Agency:     HH Arranged:    HH Agency:     Status of Service:  In process, will continue to follow  If discussed at Long Length of Stay Meetings, dates discussed:    Additional Comments:  Gala LewandowskyGraves-Bigelow, Ellouise Mcwhirter Kaye, RN 03/05/2017, 10:54 AM

## 2017-03-05 NOTE — Progress Notes (Signed)
CARDIAC REHAB PHASE I   PRE:  Rate/Rhythm: paced 75  BP:  Supine:   Sitting: 138/78  Standing:    SaO2: 97%RA  MODE:  Ambulation: 800 ft   POST:  Rate/Rhythm: 105 paced  BP:  Supine:   Sitting: 128/69  Standing:    SaO2: 96%RA 1102-1125 Pt walked 800 ft on RA with steady gait. Tolerated well. Second walk for today. To recliner after walk.   [name] Nuttingharlene Adaja Wander, RN BSN  03/05/2017 11:22 AM

## 2017-03-06 LAB — CBC WITH DIFFERENTIAL/PLATELET
BASOS ABS: 0 10*3/uL (ref 0.0–0.1)
BASOS PCT: 1 %
Eosinophils Absolute: 0.3 10*3/uL (ref 0.0–0.7)
Eosinophils Relative: 4 %
HEMATOCRIT: 31.7 % — AB (ref 39.0–52.0)
HEMOGLOBIN: 10.2 g/dL — AB (ref 13.0–17.0)
LYMPHS PCT: 19 %
Lymphs Abs: 1.2 10*3/uL (ref 0.7–4.0)
MCH: 30 pg (ref 26.0–34.0)
MCHC: 32.2 g/dL (ref 30.0–36.0)
MCV: 93.2 fL (ref 78.0–100.0)
MONO ABS: 0.8 10*3/uL (ref 0.1–1.0)
Monocytes Relative: 12 %
NEUTROS ABS: 4 10*3/uL (ref 1.7–7.7)
NEUTROS PCT: 64 %
Platelets: 246 10*3/uL (ref 150–400)
RBC: 3.4 MIL/uL — AB (ref 4.22–5.81)
RDW: 14.5 % (ref 11.5–15.5)
WBC: 6.3 10*3/uL (ref 4.0–10.5)

## 2017-03-06 LAB — HEPARIN LEVEL (UNFRACTIONATED)
HEPARIN UNFRACTIONATED: 0.41 [IU]/mL (ref 0.30–0.70)
Heparin Unfractionated: 0.25 IU/mL — ABNORMAL LOW (ref 0.30–0.70)

## 2017-03-06 LAB — BASIC METABOLIC PANEL
ANION GAP: 6 (ref 5–15)
BUN: 24 mg/dL — ABNORMAL HIGH (ref 6–20)
CHLORIDE: 103 mmol/L (ref 101–111)
CO2: 22 mmol/L (ref 22–32)
Calcium: 9 mg/dL (ref 8.9–10.3)
Creatinine, Ser: 1.71 mg/dL — ABNORMAL HIGH (ref 0.61–1.24)
GFR calc non Af Amer: 41 mL/min — ABNORMAL LOW (ref >60)
GFR, EST AFRICAN AMERICAN: 47 mL/min — AB (ref >60)
GLUCOSE: 107 mg/dL — AB (ref 65–99)
POTASSIUM: 4.1 mmol/L (ref 3.5–5.1)
Sodium: 131 mmol/L — ABNORMAL LOW (ref 135–145)

## 2017-03-06 LAB — COOXEMETRY PANEL
CARBOXYHEMOGLOBIN: 1.8 % — AB (ref 0.5–1.5)
METHEMOGLOBIN: 1 % (ref 0.0–1.5)
O2 Saturation: 78.5 %
Total hemoglobin: 10.5 g/dL — ABNORMAL LOW (ref 12.0–16.0)

## 2017-03-06 NOTE — Progress Notes (Signed)
Patient ID: [name], male   DOB: 1956/02/09, 62 y.o.   MRN: [id number]     Advanced Heart Failure Rounding Note  HF Cardiology: Shirlee Latch   Subjective:      Has RUE PICC (Unable to placed tunneled PICC with R ICD and occluded left subclavian)  Remains on milrinone 0.375 mcg. CO-OX >78%. Lasix held yesterday with CVP 1, CVP 5-6 today.  Good UOP and weight down 1 lb.  Feel much better on milrinone, walking in halls without problems.  Creatinine down to 1.7.   RHC 03/24/17 No milrinone.  RA mean 9 RV 79/12 PA 85/36, mean 59 PCWP mean 29 Oxygen saturations: PA 51% AO 98% Cardiac Output (Fick) 3.95  Cardiac Index (Fick) 1.8 PVR 7.6 WU PAPi 5   RHC 03/02/2016  RHC Procedural Findings: On milrinone 0.25 mcg.  Hemodynamics (mmHg) RA mean 6 RV 67/8 PA 52/22, mean 36 PCWP mean 21 Oxygen saturations: PA 58% AO 99% Cardiac Output (Fick) 4.73  Cardiac Index (Fick) 2.15 PVR 3.1 WU Cardiac Output (Thermo) 4.54 Cardiac Index (Thermo) 2.06  PVR 3.3 WU  Echo (1/2): EF 20-25% with severely dilated LV, severe RV dilation with mild to moderately decreased systolic function, moderate MR, moderate AI. Suspect LV thrombus.   Objective:   Weight Range: 221 lb 12.8 oz (100.6 kg) Body mass index is 30.93 kg/m.   Vital Signs:   Temp:  [97 F (36.1 C)-98.3 F (36.8 C)] 97.8 F (36.6 C) (01/12 1147) Pulse Rate:  [70-81] 81 (01/12 1147) Resp:  [18] 18 (01/12 0517) BP: (104-134)/(52-65) 134/65 (01/12 1147) SpO2:  [97 %-100 %] 100 % (01/12 1147) Weight:  [221 lb 12.8 oz (100.6 kg)] 221 lb 12.8 oz (100.6 kg) (01/12 0517) Last BM Date: 03/06/17  Weight change: Filed Weights   03/04/17 0536 03/05/17 0500 03/06/17 0517  Weight: 222 lb 14.4 oz (101.1 kg) 222 lb 6.4 oz (100.9 kg) 221 lb 12.8 oz (100.6 kg)   Intake/Output:   Intake/Output Summary (Last 24 hours) at 03/06/2017 1210 Last data filed at 03/06/2017 4098 Gross per 24 hour  Intake 840 ml  Output 1780 ml  Net -940 ml      Physical Exam  CVP 5-6  General: NAD Neck: JVP 7-8 cm, no thyromegaly or thyroid nodule.  Lungs: Clear to auscultation bilaterally with normal respiratory effort. CV: Nondisplaced PMI.  Heart regular S1/S2, no S3/S4, no murmur.  No peripheral edema.  No carotid bruit.  Normal pedal pulses.  Abdomen: Soft, nontender, no hepatosplenomegaly, no distention.  Skin: Intact without lesions or rashes.  Neurologic: Alert and oriented x 3.  Psych: Normal affect. Extremities: No clubbing or cyanosis.  HEENT: Normal.    Telemetry   A sensed V paced 80s, personally reviewed.   Labs    CBC Recent Labs    03/05/17 0543 03/06/17 0320  WBC 6.2 6.3  NEUTROABS 3.9 4.0  HGB 9.7* 10.2*  HCT 31.7* 31.7*  MCV 94.1 93.2  PLT 238 246   Basic Metabolic Panel Recent Labs    11/91/47 0543 03/06/17 0320  NA 133* 131*  K 4.0 4.1  CL 102 103  CO2 24 22  GLUCOSE 142* 107*  BUN 31* 24*  CREATININE 1.80* 1.71*  CALCIUM 9.2 9.0   Liver Function Tests No results for input(s): AST, ALT, ALKPHOS, BILITOT, PROT, ALBUMIN in the last 72 hours. No results for input(s): LIPASE, AMYLASE in the last 72 hours. Cardiac Enzymes No results for input(s): CKTOTAL, CKMB, CKMBINDEX, TROPONINI in  the last 72 hours.  BNP: BNP (last 3 results) Recent Labs    10/07/16 2103 02/18/2017 1331 02/14/2017 1613  BNP 809.6* 953.4* 1,016.9*    ProBNP (last 3 results) No results for input(s): PROBNP in the last 8760 hours.   D-Dimer No results for input(s): DDIMER in the last 72 hours. Hemoglobin A1C No results for input(s): HGBA1C in the last 72 hours. Fasting Lipid Panel No results for input(s): CHOL, HDL, LDLCALC, TRIG, CHOLHDL, LDLDIRECT in the last 72 hours. Thyroid Function Tests No results for input(s): TSH, T4TOTAL, T3FREE, THYROIDAB in the last 72 hours.  Invalid input(s): FREET3  Other results:   Imaging   No results found.  Medications:     Scheduled Medications: . amiodarone  200  mg Oral Daily  . aspirin EC  81 mg Oral Daily  . atorvastatin  40 mg Oral Daily  . digoxin  125 mcg Oral QODAY  . famotidine  20 mg Oral BID  . feeding supplement (ENSURE ENLIVE)  237 mL Oral Q1500  . hydrocortisone cream   Topical TID  . magnesium oxide  400 mg Oral Daily  . mexiletine  200 mg Oral Q12H  . mometasone-formoterol  2 puff Inhalation BID  . PARoxetine  20 mg Oral Daily  . sodium chloride flush  3 mL Intravenous Q12H  . sodium chloride flush  3 mL Intravenous Q12H  . spironolactone  12.5 mg Oral Daily  . tamsulosin  0.4 mg Oral Daily    Infusions: . sodium chloride Stopped (02/25/17 1741)  . sodium chloride    . heparin 1,550 Units/hr (03/05/17 0037)  . milrinone 0.375 mcg/kg/min (03/06/17 0326)    PRN Medications: sodium chloride, sodium chloride, acetaminophen, ALPRAZolam, docusate sodium, ondansetron (ZOFRAN) IV, sodium chloride flush, sodium chloride flush, traMADol, triamcinolone cream   Assessment/Plan   1. Acute on chronic systolic CHF: Ischemic cardiomyopathy. With low cardiac output noted on during admission in 10/17. Had upgrade to Medtronic BiV ICD in 10/17. However, with VT storm in 12/17, the LV lead was turned off due to concerns for pro-arrhythmia.  In 1/18, he developed a device infection and the BiV-ICD was removed and later replaced with a Medtronic ICD with His bundle pacing.  RHC (1/18) with preserved cardiac output.  TEE (1/18) with EF 15%.  CPX in 5/18 with moderate HF limitation.  Most recent echo in 02/25/2017 with EF 20-25%.  The RV was severely dilated but mild to moderately hypokinetic. Most recently, he lost capture of His bundle lead and AV delay was increased to limit RV pacing.  EP restarted His bundle pacing with maximal output to attain capture.  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 Low cardiac index 1.8 on RHC, milrinone begun (he was His pacing at time of RHC).  He was admitted from clinic  with NYHA class IV symptoms.  - Milrinone increased to 0.375 mcg after cath. Co-ox 78% this am - Volume stable with CVP 5-6 and good UOP, no Lasix today.  - Did not tolerate Entresto due to orthostasis, has been taken off losartan with elevated creatinine and low BP.  - Continue spironolactone 12.5 mg daily (did not tolerate increase due to abdominal discomfort).  - Continue digoxin 0.125 mg every other day. Level ok this admit.  - He is off bisoprolol, will leave off for now with low output.  - He did not tolerate Corlanor due to diarrhea.  - Plan for VAD next week. 03/23/2017, Dr  Donata Clay following.  Discussed with Dr. Edwena Blow at [facility] (transplant center).  2.  CAD: LHC in 12/17 with stable coronary disease.  No chest pain. - Continue ASA, Plavix, statin.  Good lipids 10/18. 3. Smoking: has stopped smoking for the most part since 2/18 but still has occasional cigarettes.   4. 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 Suspect this contributed to HF with loss of AV synchrony (and also was bradycardic). 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  He was BiV paced after 10/17 admission, but LV lead was turned off due to concern for pro-arrhythmia.  He now has a Medtronic ICD and had been His bundle pacing, but lost capture in His lead and was RV pacing prior to admission.   - See above discussion. EP has seen. His Bundle pacing turned to max output but this may not last long before capture lost fully. No change. 5. Ascending aortic aneurysm: CTA chest in 8/18 actually did not show a significant aneurysm. No change. 6. Ventricular tachycardia: VT storm in 12/17, may have been related to LV pacing.  Milrinone gtt started, would not use long-term due to risk of proarrhythmia.  - Continue amiodarone and mexiletine. No VT/VF this admission so far. - Recent LFTs and TSH ok with amiodarone use. No change.  - Keep K. 4.0 and Mg > 2.0  7. PTSD: Patient is on paroxetine due to significant  anxiety around shocks.   - Stable. 8. Pulmonary HTN: Severe on RHC 02/23/2017.  Mixed pulmonary venous/pulmonary arterial HTN.  Improved on repeat RHC on 03/12/17.  - off sildenafil per  patient's request due to orthostasis. No change.  9. AKI on CKD stage 3: Improved renal function after milrinone increased, creatinine down to 1.7 today.   10. LV thrombus: Noted on echo.  - Continue heparin drip. No change.   Plan for VAD early week. Will not aggressively titrate medications, but try and stabilize renal function. Holding lasix for at least today again  Length of Stay: 12  Marca Ancona, MD  03/06/2017 12:10 PM  Advanced Heart Failure Team Pager [phone] (M-F; 7a - 4p)  Please contact [facility] for night-coverage after hours (4p -7a ) and weekends on [email or web address]

## 2017-03-06 NOTE — Progress Notes (Signed)
ANTICOAGULATION CONSULT NOTE - Follow Up Consult  Pharmacy Consult for heparin Indication: LV thrombus  Allergies  Allergen Reactions  . Corlanor [Ivabradine] Diarrhea   Patient Measurements: Height: 5\' 11"  (180.3 cm) Weight: 221 lb 12.8 oz (100.6 kg) IBW/kg (Calculated) : 75.3 Heparin Dosing Weight: 95.9 kg   Vital Signs: Temp: 98.5 F (36.9 C) (01/12 2052) Temp Source: Oral (01/12 2052) BP: 124/54 (01/12 2052) Pulse Rate: 84 (01/12 2052)  Labs: Recent Labs    03/04/17 0542 03/05/17 0543 03/05/17 0858 03/06/17 0320 03/06/17 0751 03/06/17 2025  HGB 10.4* 9.7*  --  10.2*  --   --   HCT 32.8* 31.7*  --  31.7*  --   --   PLT 265 238  --  246  --   --   HEPARINUNFRC 0.44 1.40* 0.35  --  0.25* 0.41  CREATININE 2.02* 1.80*  --  1.71*  --   --    Estimated Creatinine Clearance: 52.7 mL/min (A) (by C-G formula based on SCr of 1.71 mg/dL (H)).  Assessment: [name] admitted for increased SOB, abdominal swelling, and orthopnea with known history of CHF. Patient found to have suspected LV thrombus - pharmacy consulted to start heparin. Patient was not on anticoagulation PTA. He is now s/p RHC and awaiting LVAD next Tuesday.   Heparin level tonight came back at 0.41, on 1750 units/hr. No signs/symptoms of bleeding. No infusion issues noted by nursing.   Goal of Therapy:  Heparin level 0.3-0.7 units/ml Monitor platelets by anticoagulation protocol: Yes   Plan:  Continue heparin at 1750 units/hr Recheck confirmatory heparin level in 6 hours Daily heparin level and CBC Monitor for s/sx of bleeding  Girard CooterKimberly Perkins, PharmD Clinical Pharmacist  Pager: [phone] Phone: [phone]  03/06/2017 9:16 PM

## 2017-03-06 NOTE — Progress Notes (Addendum)
ANTICOAGULATION CONSULT NOTE - Follow Up Consult  Pharmacy Consult for heparin Indication: LV thrombus  Allergies  Allergen Reactions  . Corlanor [Ivabradine] Diarrhea   Patient Measurements: Height: 5\' 11"  (180.3 cm) Weight: 221 lb 12.8 oz (100.6 kg) IBW/kg (Calculated) : 75.3 Heparin Dosing Weight: 95.9 kg   Vital Signs: Temp: 98.1 F (36.7 C) (01/12 0837) Temp Source: Oral (01/12 0837) BP: 120/54 (01/12 0837) Pulse Rate: 70 (01/12 0837)  Labs: Recent Labs    03/04/17 0542 03/05/17 0543 03/05/17 0858 03/06/17 0320 03/06/17 0751  HGB 10.4* 9.7*  --  10.2*  --   HCT 32.8* 31.7*  --  31.7*  --   PLT 265 238  --  246  --   HEPARINUNFRC 0.44 1.40* 0.35  --  0.25*  CREATININE 2.02* 1.80*  --  1.71*  --    Estimated Creatinine Clearance: 52.7 mL/min (A) (by C-G formula based on SCr of 1.71 mg/dL (H)).  Assessment: [name] admitted for increased SOB, abdominal swelling, and orthopnea with known history of CHF. Patient found to have suspected LV thrombus - pharmacy consulted to start heparin. Patient was not on anticoagulation PTA. He is now s/p RHC and awaiting LVAD next Tuesday. Heparin level this AM below goal at 0.25. CBC stable. No bleeding or infusion issues reported.  Goal of Therapy:  Heparin level 0.3-0.7 units/ml Monitor platelets by anticoagulation protocol: Yes   Plan:  Increase heparin at 1750 units/hr Recheck heparin level in 6 hours Daily heparin level and CBC Monitor for s/sx of bleeding  [name], PharmD PGY1 Pharmacy Resident Pager: [phone]  03/06/2017 11:26 AM

## 2017-03-07 LAB — CBC WITH DIFFERENTIAL/PLATELET
BASOS ABS: 0 10*3/uL (ref 0.0–0.1)
BASOS PCT: 1 %
EOS PCT: 4 %
Eosinophils Absolute: 0.2 10*3/uL (ref 0.0–0.7)
HCT: 29.4 % — ABNORMAL LOW (ref 39.0–52.0)
Hemoglobin: 9.3 g/dL — ABNORMAL LOW (ref 13.0–17.0)
Lymphocytes Relative: 19 %
Lymphs Abs: 1.1 10*3/uL (ref 0.7–4.0)
MCH: 29.9 pg (ref 26.0–34.0)
MCHC: 31.6 g/dL (ref 30.0–36.0)
MCV: 94.5 fL (ref 78.0–100.0)
MONO ABS: 0.6 10*3/uL (ref 0.1–1.0)
Monocytes Relative: 10 %
NEUTROS ABS: 4 10*3/uL (ref 1.7–7.7)
Neutrophils Relative %: 66 %
PLATELETS: 230 10*3/uL (ref 150–400)
RBC: 3.11 MIL/uL — ABNORMAL LOW (ref 4.22–5.81)
RDW: 14.9 % (ref 11.5–15.5)
WBC: 6 10*3/uL (ref 4.0–10.5)

## 2017-03-07 LAB — BASIC METABOLIC PANEL
ANION GAP: 8 (ref 5–15)
BUN: 21 mg/dL — ABNORMAL HIGH (ref 6–20)
CALCIUM: 9 mg/dL (ref 8.9–10.3)
CO2: 24 mmol/L (ref 22–32)
Chloride: 103 mmol/L (ref 101–111)
Creatinine, Ser: 1.53 mg/dL — ABNORMAL HIGH (ref 0.61–1.24)
GFR, EST AFRICAN AMERICAN: 54 mL/min — AB (ref >60)
GFR, EST NON AFRICAN AMERICAN: 46 mL/min — AB (ref >60)
Glucose, Bld: 91 mg/dL (ref 65–99)
Potassium: 4.5 mmol/L (ref 3.5–5.1)
SODIUM: 135 mmol/L (ref 135–145)

## 2017-03-07 LAB — HEPARIN LEVEL (UNFRACTIONATED): Heparin Unfractionated: 0.43 IU/mL (ref 0.30–0.70)

## 2017-03-07 LAB — COOXEMETRY PANEL
CARBOXYHEMOGLOBIN: 1.7 % — AB (ref 0.5–1.5)
METHEMOGLOBIN: 0.8 % (ref 0.0–1.5)
O2 Saturation: 63.3 %
TOTAL HEMOGLOBIN: 9.6 g/dL — AB (ref 12.0–16.0)

## 2017-03-07 MED ORDER — FUROSEMIDE 40 MG PO TABS
40.0000 mg | ORAL_TABLET | Freq: Once | ORAL | Status: AC
  Administered 2017-03-07: 40 mg via ORAL
  Filled 2017-03-07: qty 1

## 2017-03-07 NOTE — Progress Notes (Signed)
ANTICOAGULATION CONSULT NOTE - Follow Up Consult  Pharmacy Consult for heparin Indication: LV thrombus  Allergies  Allergen Reactions  . Corlanor [Ivabradine] Diarrhea   Patient Measurements: Height: 5\' 11"  (180.3 cm) Weight: 223 lb 11.2 oz (101.5 kg) IBW/kg (Calculated) : 75.3 Heparin Dosing Weight: 95.9 kg   Vital Signs: Temp: 97.9 F (36.6 C) (01/13 0850) Temp Source: Oral (01/13 0850) BP: 118/53 (01/13 0850) Pulse Rate: 72 (01/13 0850)  Labs: Recent Labs    03/05/17 0543  03/06/17 0320 03/06/17 0751 03/06/17 2025 03/07/17 0430 03/07/17 0839  HGB 9.7*  --  10.2*  --   --  9.3*  --   HCT 31.7*  --  31.7*  --   --  29.4*  --   PLT 238  --  246  --   --  230  --   HEPARINUNFRC 1.40*   < >  --  0.25* 0.41  --  0.43  CREATININE 1.80*  --  1.71*  --   --  1.53*  --    < > = values in this interval not displayed.   Estimated Creatinine Clearance: 59.2 mL/min (A) (by C-G formula based on SCr of 1.53 mg/dL (H)).  Assessment: [name] admitted for increased SOB, abdominal swelling, and orthopnea with known history of CHF. Patient found to have suspected LV thrombus - pharmacy consulted to start heparin. Patient was not on anticoagulation PTA. He is now s/p RHC and awaiting LVAD next Tuesday. Heparin level this AM remains within goal at 0.43. Hgb 9.3, pltc WNL. No bleeding reported.  Goal of Therapy:  Heparin level 0.3-0.7 units/ml Monitor platelets by anticoagulation protocol: Yes   Plan:  Continue heparin at 1750 units/hr Daily heparin level and CBC Monitor for s/sx of bleeding  Jireh Elmore N. Zigmund Danieleja, PharmD PGY1 Pharmacy Resident Pager: [phone] 03/07/2017 9:58 AM

## 2017-03-07 NOTE — Progress Notes (Signed)
Patient ID: [name], male   DOB: 1955-10-27, 62 y.o.   MRN: [id number]     Advanced Heart Failure Rounding Note  HF Cardiology: Shirlee Latch   Subjective:      Has RUE PICC (Unable to placed tunneled PICC with R ICD and occluded left subclavian)  Remains on milrinone 0.375 mcg. CO-OX 63%. Lasix held yesterday with CVP low, CVP 6-8 today.  Feel much better on milrinone, walking in halls without problems.  Creatinine down to 1.5.   RHC 03/13/2017 No milrinone.  RA mean 9 RV 79/12 PA 85/36, mean 59 PCWP mean 29 Oxygen saturations: PA 51% AO 98% Cardiac Output (Fick) 3.95  Cardiac Index (Fick) 1.8 PVR 7.6 WU PAPi 5   RHC 03/02/2016  RHC Procedural Findings: On milrinone 0.25 mcg.  Hemodynamics (mmHg) RA mean 6 RV 67/8 PA 52/22, mean 36 PCWP mean 21 Oxygen saturations: PA 58% AO 99% Cardiac Output (Fick) 4.73  Cardiac Index (Fick) 2.15 PVR 3.1 WU Cardiac Output (Thermo) 4.54 Cardiac Index (Thermo) 2.06  PVR 3.3 WU  Echo (1/2): EF 20-25% with severely dilated LV, severe RV dilation with mild to moderately decreased systolic function, moderate MR, moderate AI. Suspect LV thrombus.   Objective:   Weight Range: 223 lb 11.2 oz (101.5 kg) Body mass index is 31.2 kg/m.   Vital Signs:   Temp:  [97.8 F (36.6 C)-98.5 F (36.9 C)] 97.9 F (36.6 C) (01/13 0850) Pulse Rate:  [72-84] 72 (01/13 0850) Resp:  [16-18] 18 (01/13 0850) BP: (118-134)/(53-68) 118/53 (01/13 0850) SpO2:  [97 %-100 %] 98 % (01/13 0459) Weight:  [223 lb 11.2 oz (101.5 kg)] 223 lb 11.2 oz (101.5 kg) (01/13 0459) Last BM Date: 03/06/17  Weight change: Filed Weights   03/05/17 0500 03/06/17 0517 03/07/17 0459  Weight: 222 lb 6.4 oz (100.9 kg) 221 lb 12.8 oz (100.6 kg) 223 lb 11.2 oz (101.5 kg)   Intake/Output:   Intake/Output Summary (Last 24 hours) at 03/07/2017 1117 Last data filed at 03/07/2017 0854 Gross per 24 hour  Intake 840 ml  Output 1500 ml  Net -660 ml     Physical Exam  CVP  6-8  General: NAD Neck: JVP 8 cm, no thyromegaly or thyroid nodule.  Lungs: Clear to auscultation bilaterally with normal respiratory effort. CV: Nondisplaced PMI.  Heart regular S1/S2, no S3/S4, no murmur.  No peripheral edema.  No carotid bruit.  Normal pedal pulses.  Abdomen: Soft, nontender, no hepatosplenomegaly, no distention.  Skin: Intact without lesions or rashes.  Neurologic: Alert and oriented x 3.  Psych: Normal affect. Extremities: No clubbing or cyanosis.  HEENT: Normal.    Telemetry   A sensed V paced 80s, personally reviewed.   Labs    CBC Recent Labs    03/06/17 0320 03/07/17 0430  WBC 6.3 6.0  NEUTROABS 4.0 4.0  HGB 10.2* 9.3*  HCT 31.7* 29.4*  MCV 93.2 94.5  PLT 246 230   Basic Metabolic Panel Recent Labs    40/98/11 0320 03/07/17 0430  NA 131* 135  K 4.1 4.5  CL 103 103  CO2 22 24  GLUCOSE 107* 91  BUN 24* 21*  CREATININE 1.71* 1.53*  CALCIUM 9.0 9.0   Liver Function Tests No results for input(s): AST, ALT, ALKPHOS, BILITOT, PROT, ALBUMIN in the last 72 hours. No results for input(s): LIPASE, AMYLASE in the last 72 hours. Cardiac Enzymes No results for input(s): CKTOTAL, CKMB, CKMBINDEX, TROPONINI in the last 72 hours.  BNP: BNP (last  3 results) Recent Labs    10/07/16 2103 02/15/2017 1331 02/21/2017 1613  BNP 809.6* 953.4* 1,016.9*    ProBNP (last 3 results) No results for input(s): PROBNP in the last 8760 hours.   D-Dimer No results for input(s): DDIMER in the last 72 hours. Hemoglobin A1C No results for input(s): HGBA1C in the last 72 hours. Fasting Lipid Panel No results for input(s): CHOL, HDL, LDLCALC, TRIG, CHOLHDL, LDLDIRECT in the last 72 hours. Thyroid Function Tests No results for input(s): TSH, T4TOTAL, T3FREE, THYROIDAB in the last 72 hours.  Invalid input(s): FREET3  Other results:   Imaging   No results found.  Medications:     Scheduled Medications: . amiodarone  200 mg Oral Daily  . aspirin EC   81 mg Oral Daily  . atorvastatin  40 mg Oral Daily  . digoxin  125 mcg Oral QODAY  . famotidine  20 mg Oral BID  . feeding supplement (ENSURE ENLIVE)  237 mL Oral Q1500  . furosemide  40 mg Oral Once  . hydrocortisone cream   Topical TID  . magnesium oxide  400 mg Oral Daily  . mexiletine  200 mg Oral Q12H  . mometasone-formoterol  2 puff Inhalation BID  . PARoxetine  20 mg Oral Daily  . sodium chloride flush  3 mL Intravenous Q12H  . sodium chloride flush  3 mL Intravenous Q12H  . spironolactone  12.5 mg Oral Daily  . tamsulosin  0.4 mg Oral Daily    Infusions: . sodium chloride Stopped (02/25/17 1741)  . sodium chloride    . heparin 1,750 Units/hr (03/07/17 0640)  . milrinone 0.375 mcg/kg/min (03/07/17 0737)    PRN Medications: sodium chloride, sodium chloride, acetaminophen, ALPRAZolam, docusate sodium, ondansetron (ZOFRAN) IV, sodium chloride flush, sodium chloride flush, traMADol, triamcinolone cream   Assessment/Plan   1. Acute on chronic systolic CHF: Ischemic cardiomyopathy. With low cardiac output noted on during admission in 10/17. Had upgrade to Medtronic BiV ICD in 10/17. However, with VT storm in 12/17, the LV lead was turned off due to concerns for pro-arrhythmia.  In 1/18, he developed a device infection and the BiV-ICD was removed and later replaced with a Medtronic ICD with His bundle pacing.  RHC (1/18) with preserved cardiac output.  TEE (1/18) with EF 15%.  CPX in 5/18 with moderate HF limitation.  Most recent echo in 03/05/2017 with EF 20-25%.  The RV was severely dilated but mild to moderately hypokinetic. Most recently, he lost capture of His bundle lead and AV delay was increased to limit RV pacing.  EP restarted His bundle pacing with maximal output to attain capture.  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 Low cardiac index 1.8 on RHC, milrinone begun (he was His pacing at time of RHC).  He was admitted from  clinic with NYHA class IV symptoms.  - Milrinone increased to 0.375 mcg after cath. Co-ox 63% this am - CVP a little higher at 6-8 with JVP around 8, will give dose Lasix 40 mg po x 1 today, reassess tomorrow.  - Did not tolerate Entresto due to orthostasis, has been taken off losartan with elevated creatinine and low BP.  - Continue spironolactone 12.5 mg daily (did not tolerate increase due to abdominal discomfort).  - Continue digoxin 0.125 mg every other day. Level ok this admit.  - He is off bisoprolol, will leave off for now with low output.  - He did not tolerate Corlanor due  to diarrhea.  - Plan for VAD March 15, 2017, Dr Donata Clay following.  Discussed with Dr. Edwena Blow at [facility] (transplant center).  2.  CAD: LHC in 12/17 with stable coronary disease.  No chest pain. - Continue ASA, Plavix, statin.  Good lipids 10/18. 3. Smoking: has stopped smoking for the most part since 2/18 but still has occasional cigarettes.   4. 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 Suspect this contributed to HF with loss of AV synchrony (and also was bradycardic). 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  He was BiV paced after 10/17 admission, but LV lead was turned off due to concern for pro-arrhythmia.  He now has a Medtronic ICD and had been His bundle pacing, but lost capture in His lead and was RV pacing prior to admission.   - See above discussion. EP has seen. His Bundle pacing turned to max output but this may not last long before capture lost fully. No change. 5. Ascending aortic aneurysm: CTA chest in 8/18 actually did not show a significant aneurysm. No change. 6. Ventricular tachycardia: VT storm in 12/17, may have been related to LV pacing.  Milrinone gtt started, would not use long-term due to risk of proarrhythmia.  - Continue amiodarone and mexiletine. No VT/VF this admission so far. - Recent LFTs and TSH ok with amiodarone use. No change.  - Keep K. 4.0 and Mg > 2.0  7. PTSD:  Patient is on paroxetine due to significant anxiety around shocks.   - Stable. 8. Pulmonary HTN: Severe on RHC 02/23/2017.  Mixed pulmonary venous/pulmonary arterial HTN.  Improved on repeat RHC on 03/12/17.  - off sildenafil per  patient's request due to orthostasis. No change.  9. AKI on CKD stage 3: Improved renal function after milrinone increased, creatinine down to 1.5 today.   10. LV thrombus: Noted on echo.  - Continue heparin drip. No change.   Plan for VAD Tuesday. Will not aggressively titrate medications, but try and stabilize renal function. Will get dose of po Lasix today.   Length of Stay: 33  Marca Ancona, MD  03/07/2017 11:17 AM  Advanced Heart Failure Team Pager [phone] (M-F; 7a - 4p)  Please contact [facility] for night-coverage after hours (4p -7a ) and weekends on [email or web address]

## 2017-03-08 ENCOUNTER — Inpatient Hospital Stay (HOSPITAL_COMMUNITY): Payer: Medicaid Other

## 2017-03-08 ENCOUNTER — Other Ambulatory Visit: Payer: Self-pay | Admitting: Internal Medicine

## 2017-03-08 DIAGNOSIS — I5023 Acute on chronic systolic (congestive) heart failure: Secondary | ICD-10-CM

## 2017-03-08 LAB — BLOOD GAS, ARTERIAL
Acid-base deficit: 1.8 mmol/L (ref 0.0–2.0)
Bicarbonate: 22.3 mmol/L (ref 20.0–28.0)
DRAWN BY: 331761
FIO2: 21
O2 SAT: 96.7 %
PATIENT TEMPERATURE: 98.6
pCO2 arterial: 36.7 mmHg (ref 32.0–48.0)
pH, Arterial: 7.401 (ref 7.350–7.450)
pO2, Arterial: 93.5 mmHg (ref 83.0–108.0)

## 2017-03-08 LAB — CBC WITH DIFFERENTIAL/PLATELET
BASOS ABS: 0.1 10*3/uL (ref 0.0–0.1)
BASOS PCT: 1 %
Eosinophils Absolute: 0.3 10*3/uL (ref 0.0–0.7)
Eosinophils Relative: 4 %
HCT: 30.1 % — ABNORMAL LOW (ref 39.0–52.0)
HEMOGLOBIN: 9.4 g/dL — AB (ref 13.0–17.0)
Lymphocytes Relative: 16 %
Lymphs Abs: 1.1 10*3/uL (ref 0.7–4.0)
MCH: 29.7 pg (ref 26.0–34.0)
MCHC: 31.2 g/dL (ref 30.0–36.0)
MCV: 95 fL (ref 78.0–100.0)
Monocytes Absolute: 0.7 10*3/uL (ref 0.1–1.0)
Monocytes Relative: 10 %
NEUTROS ABS: 4.8 10*3/uL (ref 1.7–7.7)
NEUTROS PCT: 69 %
Platelets: 230 10*3/uL (ref 150–400)
RBC: 3.17 MIL/uL — ABNORMAL LOW (ref 4.22–5.81)
RDW: 14.9 % (ref 11.5–15.5)
WBC: 7 10*3/uL (ref 4.0–10.5)

## 2017-03-08 LAB — BASIC METABOLIC PANEL
ANION GAP: 8 (ref 5–15)
BUN: 20 mg/dL (ref 6–20)
CALCIUM: 9.1 mg/dL (ref 8.9–10.3)
CO2: 23 mmol/L (ref 22–32)
Chloride: 102 mmol/L (ref 101–111)
Creatinine, Ser: 1.53 mg/dL — ABNORMAL HIGH (ref 0.61–1.24)
GFR calc non Af Amer: 46 mL/min — ABNORMAL LOW (ref >60)
GFR, EST AFRICAN AMERICAN: 54 mL/min — AB (ref >60)
Glucose, Bld: 94 mg/dL (ref 65–99)
Potassium: 4.3 mmol/L (ref 3.5–5.1)
Sodium: 133 mmol/L — ABNORMAL LOW (ref 135–145)

## 2017-03-08 LAB — PROTIME-INR
INR: 1.06
Prothrombin Time: 13.7 seconds (ref 11.4–15.2)

## 2017-03-08 LAB — HEPARIN LEVEL (UNFRACTIONATED): HEPARIN UNFRACTIONATED: 0.53 [IU]/mL (ref 0.30–0.70)

## 2017-03-08 LAB — APTT: APTT: 92 s — AB (ref 24–36)

## 2017-03-08 LAB — HEMOGLOBIN A1C
Hgb A1c MFr Bld: 4.9 % (ref 4.8–5.6)
MEAN PLASMA GLUCOSE: 93.93 mg/dL

## 2017-03-08 LAB — COOXEMETRY PANEL
CARBOXYHEMOGLOBIN: 1.3 % (ref 0.5–1.5)
METHEMOGLOBIN: 1.3 % (ref 0.0–1.5)
O2 Saturation: 58.1 %
Total hemoglobin: 9.4 g/dL — ABNORMAL LOW (ref 12.0–16.0)

## 2017-03-08 LAB — SURGICAL PCR SCREEN
MRSA, PCR: NEGATIVE
STAPHYLOCOCCUS AUREUS: NEGATIVE

## 2017-03-08 LAB — PREPARE RBC (CROSSMATCH)

## 2017-03-08 MED ORDER — BISACODYL 5 MG PO TBEC
5.0000 mg | DELAYED_RELEASE_TABLET | Freq: Once | ORAL | Status: DC
  Filled 2017-03-08: qty 1

## 2017-03-08 MED ORDER — DEXMEDETOMIDINE HCL IN NACL 400 MCG/100ML IV SOLN
0.1000 ug/kg/h | INTRAVENOUS | Status: DC
  Filled 2017-03-08: qty 100

## 2017-03-08 MED ORDER — VASOPRESSIN 20 UNIT/ML IV SOLN
0.0400 [IU]/min | INTRAVENOUS | Status: DC
  Filled 2017-03-08: qty 2

## 2017-03-08 MED ORDER — VANCOMYCIN HCL 10 G IV SOLR
1500.0000 mg | INTRAVENOUS | Status: DC
  Filled 2017-03-08: qty 1500

## 2017-03-08 MED ORDER — SODIUM CHLORIDE 0.9 % IV SOLN
600.0000 mg | INTRAVENOUS | Status: DC
  Filled 2017-03-08: qty 600

## 2017-03-08 MED ORDER — VANCOMYCIN HCL 1000 MG IV SOLR
1000.0000 mg | INTRAVENOUS | Status: DC
  Filled 2017-03-08: qty 1000

## 2017-03-08 MED ORDER — EPINEPHRINE PF 1 MG/ML IJ SOLN
0.0000 ug/min | INTRAVENOUS | Status: DC
  Filled 2017-03-08: qty 4

## 2017-03-08 MED ORDER — DEXTROSE 5 % IV SOLN
750.0000 mg | INTRAVENOUS | Status: DC
  Filled 2017-03-08: qty 750

## 2017-03-08 MED ORDER — DOBUTAMINE IN D5W 4-5 MG/ML-% IV SOLN
2.0000 ug/kg/min | INTRAVENOUS | Status: DC
  Filled 2017-03-08: qty 250

## 2017-03-08 MED ORDER — FLUCONAZOLE IN SODIUM CHLORIDE 400-0.9 MG/200ML-% IV SOLN
400.0000 mg | INTRAVENOUS | Status: DC
Start: 2017-03-09 — End: 2017-03-09
  Filled 2017-03-08: qty 200

## 2017-03-08 MED ORDER — MUPIROCIN 2 % EX OINT
1.0000 "application " | TOPICAL_OINTMENT | Freq: Two times a day (BID) | CUTANEOUS | Status: AC
  Administered 2017-03-08 (×2): 1 via NASAL
  Filled 2017-03-08: qty 22

## 2017-03-08 MED ORDER — SODIUM CHLORIDE 0.9% FLUSH
10.0000 mL | Freq: Two times a day (BID) | INTRAVENOUS | Status: DC
  Administered 2017-03-08: 10 mL

## 2017-03-08 MED ORDER — SODIUM CHLORIDE 0.9 % IV SOLN
0.0000 ug/min | INTRAVENOUS | Status: DC
  Filled 2017-03-08: qty 2

## 2017-03-08 MED ORDER — DEXTROSE 5 % IV SOLN
0.0000 ug/min | INTRAVENOUS | Status: DC
  Filled 2017-03-08: qty 4

## 2017-03-08 MED ORDER — CHLORHEXIDINE GLUCONATE 0.12 % MT SOLN
15.0000 mL | Freq: Once | OROMUCOSAL | Status: AC
  Administered 2017-03-09: 15 mL via OROMUCOSAL
  Filled 2017-03-08: qty 15

## 2017-03-08 MED ORDER — CHLORHEXIDINE GLUCONATE 4 % EX LIQD
60.0000 mL | Freq: Once | CUTANEOUS | Status: AC
  Administered 2017-03-09: 4 via TOPICAL
  Filled 2017-03-08: qty 60

## 2017-03-08 MED ORDER — DEXTROSE 5 % IV SOLN
1.5000 g | INTRAVENOUS | Status: DC
  Filled 2017-03-08: qty 1.5

## 2017-03-08 MED ORDER — POTASSIUM CHLORIDE 2 MEQ/ML IV SOLN
80.0000 meq | INTRAVENOUS | Status: DC
Start: 2017-03-09 — End: 2017-03-09
  Filled 2017-03-08: qty 40

## 2017-03-08 MED ORDER — TEMAZEPAM 15 MG PO CAPS
15.0000 mg | ORAL_CAPSULE | Freq: Once | ORAL | Status: AC | PRN
  Administered 2017-03-08: 15 mg via ORAL
  Filled 2017-03-08: qty 1

## 2017-03-08 MED ORDER — SODIUM CHLORIDE 0.9 % IV SOLN
1.5000 mg/kg/h | INTRAVENOUS | Status: DC
  Filled 2017-03-08: qty 25

## 2017-03-08 MED ORDER — MAGNESIUM SULFATE 50 % IJ SOLN
40.0000 meq | INTRAMUSCULAR | Status: DC
Start: 2017-03-09 — End: 2017-03-09
  Filled 2017-03-08: qty 9.85

## 2017-03-08 MED ORDER — CHLORHEXIDINE GLUCONATE 4 % EX LIQD
60.0000 mL | Freq: Once | CUTANEOUS | Status: AC
  Administered 2017-03-08: 4 via TOPICAL
  Filled 2017-03-08: qty 60

## 2017-03-08 MED ORDER — TRANEXAMIC ACID (OHS) BOLUS VIA INFUSION
15.0000 mg/kg | INTRAVENOUS | Status: DC
  Filled 2017-03-08: qty 1532

## 2017-03-08 MED ORDER — SODIUM CHLORIDE 0.9 % IV SOLN
INTRAVENOUS | Status: DC
  Filled 2017-03-08: qty 1

## 2017-03-08 MED ORDER — NITROGLYCERIN IN D5W 200-5 MCG/ML-% IV SOLN
0.0000 ug/min | INTRAVENOUS | Status: DC
  Filled 2017-03-08: qty 250

## 2017-03-08 MED ORDER — TRANEXAMIC ACID (OHS) PUMP PRIME SOLUTION
2.0000 mg/kg | INTRAVENOUS | Status: DC
  Filled 2017-03-08: qty 2.04

## 2017-03-08 MED ORDER — SODIUM CHLORIDE 0.9% FLUSH: 10.0000 mL | INTRAVENOUS | Status: DC | PRN

## 2017-03-08 MED ORDER — DOPAMINE-DEXTROSE 3.2-5 MG/ML-% IV SOLN
0.0000 ug/kg/min | INTRAVENOUS | Status: DC
  Filled 2017-03-08: qty 250

## 2017-03-08 MED ORDER — MILRINONE LACTATE IN DEXTROSE 20-5 MG/100ML-% IV SOLN
0.3000 ug/kg/min | INTRAVENOUS | Status: AC
  Administered 2017-03-09: 0.375 ug/kg/min via INTRAVENOUS
  Filled 2017-03-08: qty 100

## 2017-03-08 MED ORDER — SODIUM CHLORIDE 0.9 % IV SOLN
INTRAVENOUS | Status: DC
  Filled 2017-03-08: qty 30

## 2017-03-08 NOTE — Progress Notes (Signed)
6 Days Post-Op Procedure(s) (LRB): RIGHT HEART CATH (N/A) Subjective: Mixed ischemicnonischemic cardiomyopathy with re-admissions for HF currently on iv Milrinone Plan Heartmate 3 Implant in am with closure of Aortic valve I have reviewed his echo, cor angiograms and chest CT His best long term therapy for hos HF is Destination Therapy VAD I have discuseed the details of the implanr procedure with the patient and his family He agrees to proceed with HM3 under informed consent Objective: Vital signs in last 24 hours: Temp:  [97.9 F (36.6 C)-98.8 F (37.1 C)] 97.9 F (36.6 C) (01/14 1212) Pulse Rate:  [72-86] 78 (01/14 1212) Cardiac Rhythm: A-V Sequential paced (01/14 0800) Resp:  [18] 18 (01/14 0511) BP: (113-123)/(47-65) 117/55 (01/14 1212) SpO2:  [97 %-99 %] 98 % (01/14 1212) Weight:  [225 lb 1.6 oz (102.1 kg)] 225 lb 1.6 oz (102.1 kg) (01/14 0511)  Hemodynamic parameters for last 24 hours: CVP:  [1 mmHg-7 mmHg] 1 mmHg  Intake/Output from previous day: 01/13 0701 - 01/14 0700 In: 1080 [P.O.:1080] Out: 1800 [Urine:1800] Intake/Output this shift: Total I/O In: 720 [P.O.:720] Out: -        Exam    General- alert and comfortable    Neck- no JVD, no cervical adenopathy palpable, no carotid bruit   Lungs- clear without rales, wheezes   Cor- regular rate and rhythm, n+ AI murmur , + gallop   Abdomen- soft, non-tender   Extremities - warm, non-tender, minimal edema   Neuro- oriented, appropriate, no focal weakness   Lab Results: Recent Labs    03/07/17 0430 03/08/17 0548  WBC 6.0 7.0  HGB 9.3* 9.4*  HCT 29.4* 30.1*  PLT 230 230   BMET:  Recent Labs    03/07/17 0430 03/08/17 0548  NA 135 133*  K 4.5 4.3  CL 103 102  CO2 24 23  GLUCOSE 91 94  BUN 21* 20  CREATININE 1.53* 1.53*  CALCIUM 9.0 9.1    PT/INR:  Recent Labs    03/08/17 0956  LABPROT 13.7  INR 1.06   ABG    Component Value Date/Time   PHART 7.487 (H) 03/23/2016 2002   HCO3 24.8  03/08/2017 1615   TCO2 26 03/16/2017 1615   ACIDBASEDEF 2.0 03/19/2017 1615   O2SAT 58.1 03/08/2017 0556   CBG (last 3)  No results for input(s): GLUCAP in the last 72 hours.  Assessment/Plan: S/P Procedure(s) (LRB): RIGHT HEART CATH (N/A) Heartmate 3 Implant and Closure of Aortic valve for 3+ AI  Surgery in am  LOS: 14 days    [name] 03/08/2017

## 2017-03-08 NOTE — Progress Notes (Signed)
Patient ID: [name], male   DOB: 1955/05/09, 62 y.o.   MRN: [id number]     Advanced Heart Failure Rounding Note  HF Cardiology: Shirlee Latch   Subjective:      Has RUE PICC (Unable to placed tunneled PICC with R ICD and occluded left subclavian)  Remains on milrinone 0.375 mcg. CO-OX 58%> Yesterday received 40 mg po lasix.   Denies SOB/CO    RHC 03/15/2017 No milrinone.  RA mean 9 RV 79/12 PA 85/36, mean 59 PCWP mean 29 Oxygen saturations: PA 51% AO 98% Cardiac Output (Fick) 3.95  Cardiac Index (Fick) 1.8 PVR 7.6 WU PAPi 5   RHC 03/02/2016  RHC Procedural Findings: On milrinone 0.25 mcg.  Hemodynamics (mmHg) RA mean 6 RV 67/8 PA 52/22, mean 36 PCWP mean 21 Oxygen saturations: PA 58% AO 99% Cardiac Output (Fick) 4.73  Cardiac Index (Fick) 2.15 PVR 3.1 WU Cardiac Output (Thermo) 4.54 Cardiac Index (Thermo) 2.06  PVR 3.3 WU  Echo (1/2): EF 20-25% with severely dilated LV, severe RV dilation with mild to moderately decreased systolic function, moderate MR, moderate AI. Suspect LV thrombus.   Objective:   Weight Range: 225 lb 1.6 oz (102.1 kg) Body mass index is 31.4 kg/m.   Vital Signs:   Temp:  [97.9 F (36.6 C)-98.8 F (37.1 C)] 98 F (36.7 C) (01/14 0829) Pulse Rate:  [72-87] 72 (01/14 0829) Resp:  [18] 18 (01/14 0511) BP: (113-127)/(47-65) 118/47 (01/14 0829) SpO2:  [97 %-99 %] 99 % (01/14 0829) Weight:  [225 lb 1.6 oz (102.1 kg)] 225 lb 1.6 oz (102.1 kg) (01/14 0511) Last BM Date: 03/07/17  Weight change: Filed Weights   03/06/17 0517 03/07/17 0459 03/08/17 0511  Weight: 221 lb 12.8 oz (100.6 kg) 223 lb 11.2 oz (101.5 kg) 225 lb 1.6 oz (102.1 kg)   Intake/Output:   Intake/Output Summary (Last 24 hours) at 03/08/2017 0840 Last data filed at 03/08/2017 0829 Gross per 24 hour  Intake 1440 ml  Output 1800 ml  Net -360 ml     Physical Exam  CVP 4-5 Personally checked  General:  Walking in the room. No resp difficulty HEENT: normal Neck: supple.  no JVD. Carotids 2+ bilat; no bruits. No lymphadenopathy or thryomegaly appreciated. Cor: PMI nondisplaced. Regular rate & rhythm. No rubs, gallops or murmurs. Lungs: clear Abdomen: soft, nontender, nondistended. No hepatosplenomegaly. No bruits or masses. Good bowel sounds. Extremities: no cyanosis, clubbing, rash, edema. RUE PICC Neuro: alert & orientedx3, cranial nerves grossly intact. moves all 4 extremities w/o difficulty. Affect pleasant    Telemetry   A sensed V paced 80s. Personally reviewed.    Labs    CBC Recent Labs    03/07/17 0430 03/08/17 0548  WBC 6.0 7.0  NEUTROABS 4.0 4.8  HGB 9.3* 9.4*  HCT 29.4* 30.1*  MCV 94.5 95.0  PLT 230 230   Basic Metabolic Panel Recent Labs    40/98/11 0430 03/08/17 0548  NA 135 133*  K 4.5 4.3  CL 103 102  CO2 24 23  GLUCOSE 91 94  BUN 21* 20  CREATININE 1.53* 1.53*  CALCIUM 9.0 9.1   Liver Function Tests No results for input(s): AST, ALT, ALKPHOS, BILITOT, PROT, ALBUMIN in the last 72 hours. No results for input(s): LIPASE, AMYLASE in the last 72 hours. Cardiac Enzymes No results for input(s): CKTOTAL, CKMB, CKMBINDEX, TROPONINI in the last 72 hours.  BNP: BNP (last 3 results) Recent Labs    10/07/16 2103 02/02/2017 1331 02/05/2017 1613  BNP 809.6* 953.4* 1,016.9*    ProBNP (last 3 results) No results for input(s): PROBNP in the last 8760 hours.   D-Dimer No results for input(s): DDIMER in the last 72 hours. Hemoglobin A1C No results for input(s): HGBA1C in the last 72 hours. Fasting Lipid Panel No results for input(s): CHOL, HDL, LDLCALC, TRIG, CHOLHDL, LDLDIRECT in the last 72 hours. Thyroid Function Tests No results for input(s): TSH, T4TOTAL, T3FREE, THYROIDAB in the last 72 hours.  Invalid input(s): FREET3  Other results:   Imaging   Dg Chest 2 View  Result Date: 03/08/2017 CLINICAL DATA:  Congestive heart failure. EXAM: CHEST  2 VIEW COMPARISON:  03/03/2017 FINDINGS: Heart size and pulmonary  vascularity are normal. AICD in place. Coronary artery stents in place. Slight interstitial accentuation with several Kerley B-lines at the left lung base. No pleural effusion. No significant bone abnormality. IMPRESSION: Minimal interstitial pulmonary edema. Electronically Signed   By: Francene Boyers M.D.   On: 03/08/2017 07:16    Medications:     Scheduled Medications: . amiodarone  200 mg Oral Daily  . aspirin EC  81 mg Oral Daily  . atorvastatin  40 mg Oral Daily  . bisacodyl  5 mg Oral Once  . chlorhexidine  60 mL Topical Once   And  . [START ON 03/13/2017] chlorhexidine  60 mL Topical Once  . [START ON 03/13/17] chlorhexidine  15 mL Mouth/Throat Once  . digoxin  125 mcg Oral QODAY  . famotidine  20 mg Oral BID  . feeding supplement (ENSURE ENLIVE)  237 mL Oral Q1500  . hydrocortisone cream   Topical TID  . magnesium oxide  400 mg Oral Daily  . [START ON 03/13/2017] magnesium sulfate  40 mEq Other To OR  . mexiletine  200 mg Oral Q12H  . mometasone-formoterol  2 puff Inhalation BID  . mupirocin ointment  1 application Nasal BID  . PARoxetine  20 mg Oral Daily  . [START ON Mar 13, 2017] potassium chloride  80 mEq Other To OR  . sodium chloride flush  3 mL Intravenous Q12H  . sodium chloride flush  3 mL Intravenous Q12H  . spironolactone  12.5 mg Oral Daily  . tamsulosin  0.4 mg Oral Daily  . [START ON Mar 13, 2017] tranexamic acid  15 mg/kg Intravenous To OR  . [START ON 03/13/17] tranexamic acid  2 mg/kg Intracatheter To OR  . [START ON 03/13/17] vancomycin  1,000 mg Other To OR    Infusions: . sodium chloride Stopped (02/25/17 1741)  . sodium chloride    . [START ON 13-Mar-2017] cefUROXime (ZINACEF)  IV    . [START ON 03-13-17] cefUROXime (ZINACEF)  IV    . [START ON 03-13-17] dexmedetomidine    . [START ON 03-13-2017] DOBUTamine    . [START ON 2017/03/13] DOPamine    . [START ON March 13, 2017] epinephrine    . [START ON 03-13-17] fluconazole (DIFLUCAN) IV    . [START ON  2017-03-13] heparin 30,000 units/NS 1000 mL solution for CELLSAVER    . heparin 1,750 Units/hr (03/07/17 1821)  . [START ON Mar 13, 2017] insulin (NOVOLIN-R) infusion    . milrinone 0.375 mcg/kg/min (03/07/17 1631)  . [START ON 2017/03/13] milrinone    . [START ON 2017/03/13] nitroGLYCERIN    . [START ON 03/13/2017] norepinephrine (LEVOPHED) Adult infusion    . [START ON Mar 13, 2017] phenylephrine 20mg /249mL NS (0.08mg /ml) infusion    . [START ON Mar 13, 2017] rifampin (RIFADIN) IVPB    . [START ON March 13, 2017] tranexamic acid (CYKLOKAPRON) infusion ([facility])    . [  START ON 03/01/2017] vancomycin    . [START ON 03/23/2017] vasopressin (PITRESSIN) infusion - *FOR SHOCK*      PRN Medications: sodium chloride, sodium chloride, acetaminophen, ALPRAZolam, docusate sodium, ondansetron (ZOFRAN) IV, sodium chloride flush, sodium chloride flush, temazepam, traMADol, triamcinolone cream   Assessment/Plan   1. Acute on chronic systolic CHF: Ischemic cardiomyopathy. With low cardiac output noted on during admission in 10/17. Had upgrade to Medtronic BiV ICD in 10/17. However, with VT storm in 12/17, the LV lead was turned off due to concerns for pro-arrhythmia.  In 1/18, he developed a device infection and the BiV-ICD was removed and later replaced with a Medtronic ICD with His bundle pacing.  RHC (1/18) with preserved cardiac output.  TEE (1/18) with EF 15%.  CPX in 5/18 with moderate HF limitation.  Most recent echo in 03/04/2017 with EF 20-25%.  The RV was severely dilated but mild to moderately hypokinetic. Most recently, he lost capture of His bundle lead and AV delay was increased to limit RV pacing.  EP restarted His bundle pacing with maximal output to attain capture.  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 Low cardiac index 1.8 on RHC, milrinone begun (he was His pacing at time of RHC).  He was admitted from clinic with NYHA class IV symptoms.  - Milrinone increased to  0.375 mcg after cath. CO-OX 58%, now.  CVP 4-5. No lasix today.  - Did not tolerate Entresto due to orthostasis, has been taken off losartan with elevated creatinine and low BP.  - Continue spironolactone 12.5 mg daily (did not tolerate increase due to abdominal discomfort).  - Continue digoxin 0.125 mg every other day. Level ok this admit.  - He is off bisoprolol, will leave off for now with low output.  - He did not tolerate Corlanor due to diarrhea.  - Plan for VAD 03/02/2017, Dr Donata Clay following.  Discussed with Dr. Edwena Blow at [facility] (transplant center).  2.  CAD: LHC in 12/17 with stable coronary disease.  No chest pain. - Continue ASA, Plavix, statin.  Good lipids 10/18. 3. Smoking: has stopped smoking for the most part since 2/18 but still has occasional cigarettes.   4. 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 Suspect this contributed to HF with loss of AV synchrony (and also was bradycardic). 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  He was BiV paced after 10/17 admission, but LV lead was turned off due to concern for pro-arrhythmia.  He now has a Medtronic ICD and had been His bundle pacing, but lost capture in His lead and was RV pacing prior to admission.   - See above discussion. EP has seen. His Bundle pacing turned to max output but this may not last long before capture lost fully. No change. 5. Ascending aortic aneurysm: CTA chest in 8/18 actually did not show a significant aneurysm. No change. 6. Ventricular tachycardia: VT storm in 12/17, may have been related to LV pacing.  Milrinone gtt started, would not use long-term due to risk of proarrhythmia.  - Continue amiodarone and mexiletine. No VT/VF this admission so far. - Recent LFTs and TSH ok with amiodarone use. No change.  - Stable today. No VT.  - Keep K. 4.0 and Mg > 2.0  7. PTSD: Patient is on paroxetine due to significant anxiety around shocks.   - Stable. 8. Pulmonary HTN: Severe on RHC 03/21/2017.  Mixed  pulmonary venous/pulmonary arterial HTN.  Improved on repeat RHC on 03/12/17.  - off sildenafil per  patient's request due to orthostasis. No change.  9. AKI on CKD stage 3:  Creatinine stable at 1.5.    10. LV thrombus: Noted on echo.  - Continue heparin drip.    Plan for VAD in am.  Length of Stay: 14  Amy Clegg, NP  03/08/2017 8:40 AM  Advanced Heart Failure Team Pager [phone] (M-F; 7a - 4p)  Please contact [facility] for night-coverage after hours (4p -7a ) and weekends on [email or web address]  Patient seen with NP, agree with the above note.  Creatinine stable at 1.5, CVP 4-5.  No Lasix today.  Will continue current milrinone gtt.  Plan for LVAD tomorrow morning.   Marca AnconaDalton Dominion Kathan 03/08/2017 1:13 PM

## 2017-03-08 NOTE — Consult Note (Signed)
Anesthesiology Pre-op Note:  [age] year old male with end-stage ischemic cardiomyopathy. Admitted on 01/31/2017 with advanced heart failure, low CO and fluid overload. Now clinically improved with diuresis/milrinone. Now scheduled for Heartmate III LVAD implantation  with probable aortic valve closure  tomorrow for destination therapy.  Problems: 1. Ischemic cardiomyopathy with LV remodeling. EF 20-25 by last ECHO. Suffered AWMI 2002. Multiple PTCAs with stents 2002, 2009, 05/2011, 08/2012. [facility] small D2, stable anatomy no targets for intervention. No recent chest pain. 2. H/O VT: Hospitalized  12/17 for VT storm had multiple ICD discharges. Now stabilized on amiodarone. No further VT. Suffered PTSD from multiple shocks. 3. H/O ICD infection 02/2016. ICD removed and Medtronic ICD replaced on 2/218 with HIS bundle pacing. LV lead inactivated, ICD  paces RV only. Underlying complete heart block. 4. LV thrombus seen on ECHO 02/23/17, now on heparin 5. L. Subclavian vein occlusion, unable to thread PICC line from R. Arm. 6. Smoker 47 pack year hx, quit 10/2015 7. Moderate aortic insufficiency 8. Renal insufficiency Cr. now 1.53 at baseline 9. TIA Dx 11/2014 on aspirin/Plavix, now held P2Y12 within normal range at 226 on 03/03/17 10. Htn 11. Depression/PTSD  Data:  RHC 03/06/2017 Hemodynamics (mmHg) RA mean 6 RV 67/8 PA 52/22, mean 36 PCWP mean 21  Oxygen saturations: PA 58% AO 99%  Cardiac Output (Fick) 4.73  Cardiac Index (Fick) 2.15 PVR 3.1 WU  ECHO 02/23/17: LV EF 20-25% LVID 74.9 mm Moderate AI Moderate MR RV severely dilated. Systolic function mild to moderately reduced TV mild TR  Labs: 03/08/17 CoX- 58%  PT/INR 13.7/1.06 Na-133 BUN/Cr 20/1.53 H/H- 9.4/30.1  Platelets- 230,000  CXR 03/03/17; mild interstitial edema no pleural effusions  Exam: Pleasant adult WM in NAD VS: T-36.6 BP-117/55 HR-78 RR-18 O2 Sat 98% on RA HEENT-  Adequate mouth opening, edentulous upper,  good neck mobilty Heart- RRR  Lungs- clear no crackles  Anesthetic plan discussed with patient. I explained to him that he will remain intubated at least overnight after surgery. He understands and agrees to proceed.  Kipp Broodavid Stephene Alegria

## 2017-03-08 NOTE — Progress Notes (Signed)
ANTICOAGULATION CONSULT NOTE - Follow Up Consult  Pharmacy Consult for heparin Indication: LV thrombus  Allergies  Allergen Reactions  . Corlanor [Ivabradine] Diarrhea   Patient Measurements: Height: 5\' 11"  (180.3 cm) Weight: 225 lb 1.6 oz (102.1 kg) IBW/kg (Calculated) : 75.3 Heparin Dosing Weight: 95.9 kg   Vital Signs: Temp: 97.9 F (36.6 C) (01/14 1212) Temp Source: Oral (01/14 1212) BP: 117/55 (01/14 1212) Pulse Rate: 78 (01/14 1212)  Labs: Recent Labs    03/06/17 0320  03/06/17 2025 03/07/17 0430 03/07/17 0839 03/08/17 0548 03/08/17 0751 03/08/17 0956  HGB 10.2*  --   --  9.3*  --  9.4*  --   --   HCT 31.7*  --   --  29.4*  --  30.1*  --   --   PLT 246  --   --  230  --  230  --   --   LABPROT  --   --   --   --   --   --   --  13.7  INR  --   --   --   --   --   --   --  1.06  HEPARINUNFRC  --    < > 0.41  --  0.43  --  0.53  --   CREATININE 1.71*  --   --  1.53*  --  1.53*  --   --    < > = values in this interval not displayed.   Estimated Creatinine Clearance: 59.3 mL/min (A) (by C-G formula based on SCr of 1.53 mg/dL (H)).  Assessment: [name] admitted for increased SOB, abdominal swelling, and orthopnea with known history of CHF. Patient found to have suspected LV thrombus - pharmacy consulted to start heparin. Patient was not on anticoagulation PTA. He is now s/p RHC and awaiting LVAD next Tuesday. Heparin level this AM remains within goal at 0.53. Hgb 9.4, pltc WNL. No bleeding reported.  Goal of Therapy:  Heparin level 0.3-0.7 units/ml Monitor platelets by anticoagulation protocol: Yes   Plan:  Continue heparin at 1750 units/hr Daily heparin level and CBC Monitor for s/sx of bleeding  Tad MooreJessica Briseida Gittings, Pharm D, BCPS  Clinical Pharmacist Pager [phone]  03/08/2017 2:34 PM

## 2017-03-08 NOTE — Progress Notes (Signed)
CSW met at bedside with patient. Patient reports he is ready for surgery tomorrow and feels "at peace" and ready to go. Patient spoke at length about his journey to VAD and noted good support from family and staff. Patient in good spirits and appears ready for surgery. CSW will continue to follow for supportive needs throughout implant hospitalization. Raquel Sarna, [place], [place]

## 2017-03-08 NOTE — Progress Notes (Signed)
1413 Came to see pt to walk. He is in with SW. He has walked once already and stated will walk later again today. Will follow up after LVAD. Luetta NuttingCharlene Jacolyn Joaquin RN BSN 03/08/2017 2:14 PM

## 2017-03-09 ENCOUNTER — Inpatient Hospital Stay (HOSPITAL_COMMUNITY): Payer: Medicaid Other | Admitting: Certified Registered Nurse Anesthetist

## 2017-03-09 ENCOUNTER — Inpatient Hospital Stay (HOSPITAL_COMMUNITY): Payer: Medicaid Other

## 2017-03-09 ENCOUNTER — Encounter (HOSPITAL_COMMUNITY): Payer: Self-pay | Admitting: Certified Registered Nurse Anesthetist

## 2017-03-09 ENCOUNTER — Encounter (HOSPITAL_COMMUNITY): Admission: AD | Disposition: E | Payer: Self-pay | Source: Ambulatory Visit | Attending: Cardiology

## 2017-03-09 DIAGNOSIS — I351 Nonrheumatic aortic (valve) insufficiency: Secondary | ICD-10-CM

## 2017-03-09 DIAGNOSIS — I255 Ischemic cardiomyopathy: Secondary | ICD-10-CM

## 2017-03-09 HISTORY — PX: AORTIC VALVE REPAIR: SHX6306

## 2017-03-09 HISTORY — PX: INSERTION OF IMPLANTABLE LEFT VENTRICULAR ASSIST DEVICE: SHX5866

## 2017-03-09 HISTORY — PX: TEE WITHOUT CARDIOVERSION: SHX5443

## 2017-03-09 LAB — POCT I-STAT 3, ART BLOOD GAS (G3+)
ACID-BASE DEFICIT: 7 mmol/L — AB (ref 0.0–2.0)
ACID-BASE DEFICIT: 3 mmol/L — AB (ref 0.0–2.0)
Acid-base deficit: 2 mmol/L (ref 0.0–2.0)
Acid-base deficit: 2 mmol/L (ref 0.0–2.0)
BICARBONATE: 23 mmol/L (ref 20.0–28.0)
BICARBONATE: 20.5 mmol/L (ref 20.0–28.0)
BICARBONATE: 26.6 mmol/L (ref 20.0–28.0)
BICARBONATE: 27.2 mmol/L (ref 20.0–28.0)
O2 SAT: 100 %
O2 SAT: 64 %
O2 Saturation: 87 %
O2 Saturation: 63 %
PCO2 ART: 39 mmHg (ref 32.0–48.0)
PCO2 ART: 73 mmHg — AB (ref 32.0–48.0)
PCO2 ART: 73.7 mmHg — AB (ref 32.0–48.0)
PH ART: 7.38 (ref 7.350–7.450)
PO2 ART: 64 mmHg — AB (ref 83.0–108.0)
PO2 ART: 42 mmHg — AB (ref 83.0–108.0)
TCO2: 24 mmol/L (ref 22–32)
TCO2: 22 mmol/L (ref 22–32)
TCO2: 29 mmol/L (ref 22–32)
TCO2: 29 mmol/L (ref 22–32)
pCO2 arterial: 51.3 mmHg — ABNORMAL HIGH (ref 32.0–48.0)
pH, Arterial: 7.21 — ABNORMAL LOW (ref 7.350–7.450)
pH, Arterial: 7.166 — CL (ref 7.350–7.450)
pH, Arterial: 7.18 — CL (ref 7.350–7.450)
pO2, Arterial: 393 mmHg — ABNORMAL HIGH (ref 83.0–108.0)
pO2, Arterial: 43 mmHg — ABNORMAL LOW (ref 83.0–108.0)

## 2017-03-09 LAB — POCT I-STAT, CHEM 8
BUN: 20 mg/dL (ref 6–20)
BUN: 20 mg/dL (ref 6–20)
BUN: 19 mg/dL (ref 6–20)
BUN: 19 mg/dL (ref 6–20)
BUN: 22 mg/dL — AB (ref 6–20)
BUN: 20 mg/dL (ref 6–20)
CALCIUM ION: 1.08 mmol/L — AB (ref 1.15–1.40)
CALCIUM ION: 0.75 mmol/L — AB (ref 1.15–1.40)
CHLORIDE: 103 mmol/L (ref 101–111)
CHLORIDE: 104 mmol/L (ref 101–111)
CHLORIDE: 102 mmol/L (ref 101–111)
CHLORIDE: 103 mmol/L (ref 101–111)
CHLORIDE: 103 mmol/L (ref 101–111)
CREATININE: 1.2 mg/dL (ref 0.61–1.24)
CREATININE: 1.2 mg/dL (ref 0.61–1.24)
CREATININE: 1.3 mg/dL — AB (ref 0.61–1.24)
CREATININE: 1.2 mg/dL (ref 0.61–1.24)
Calcium, Ion: 1.28 mmol/L (ref 1.15–1.40)
Calcium, Ion: 1.22 mmol/L (ref 1.15–1.40)
Calcium, Ion: 1.53 mmol/L (ref 1.15–1.40)
Calcium, Ion: 1.34 mmol/L (ref 1.15–1.40)
Chloride: 105 mmol/L (ref 101–111)
Creatinine, Ser: 1.3 mg/dL — ABNORMAL HIGH (ref 0.61–1.24)
Creatinine, Ser: 1.1 mg/dL (ref 0.61–1.24)
GLUCOSE: 132 mg/dL — AB (ref 65–99)
GLUCOSE: 126 mg/dL — AB (ref 65–99)
GLUCOSE: 142 mg/dL — AB (ref 65–99)
Glucose, Bld: 99 mg/dL (ref 65–99)
Glucose, Bld: 178 mg/dL — ABNORMAL HIGH (ref 65–99)
Glucose, Bld: 131 mg/dL — ABNORMAL HIGH (ref 65–99)
HCT: 28 % — ABNORMAL LOW (ref 39.0–52.0)
HCT: 23 % — ABNORMAL LOW (ref 39.0–52.0)
HCT: 23 % — ABNORMAL LOW (ref 39.0–52.0)
HCT: 35 % — ABNORMAL LOW (ref 39.0–52.0)
HEMATOCRIT: 28 % — AB (ref 39.0–52.0)
HEMATOCRIT: 29 % — AB (ref 39.0–52.0)
HEMOGLOBIN: 9.5 g/dL — AB (ref 13.0–17.0)
Hemoglobin: 9.5 g/dL — ABNORMAL LOW (ref 13.0–17.0)
Hemoglobin: 7.8 g/dL — ABNORMAL LOW (ref 13.0–17.0)
Hemoglobin: 7.8 g/dL — ABNORMAL LOW (ref 13.0–17.0)
Hemoglobin: 9.9 g/dL — ABNORMAL LOW (ref 13.0–17.0)
Hemoglobin: 11.9 g/dL — ABNORMAL LOW (ref 13.0–17.0)
POTASSIUM: 4.9 mmol/L (ref 3.5–5.1)
POTASSIUM: 5.3 mmol/L — AB (ref 3.5–5.1)
POTASSIUM: 5.1 mmol/L (ref 3.5–5.1)
POTASSIUM: 4.7 mmol/L (ref 3.5–5.1)
POTASSIUM: 4.7 mmol/L (ref 3.5–5.1)
Potassium: 5.1 mmol/L (ref 3.5–5.1)
SODIUM: 136 mmol/L (ref 135–145)
SODIUM: 139 mmol/L (ref 135–145)
Sodium: 137 mmol/L (ref 135–145)
Sodium: 137 mmol/L (ref 135–145)
Sodium: 139 mmol/L (ref 135–145)
Sodium: 141 mmol/L (ref 135–145)
TCO2: 24 mmol/L (ref 22–32)
TCO2: 24 mmol/L (ref 22–32)
TCO2: 24 mmol/L (ref 22–32)
TCO2: 23 mmol/L (ref 22–32)
TCO2: 23 mmol/L (ref 22–32)
TCO2: 28 mmol/L (ref 22–32)

## 2017-03-09 LAB — CBC WITH DIFFERENTIAL/PLATELET
BASOS PCT: 1 %
Basophils Absolute: 0.1 10*3/uL (ref 0.0–0.1)
Eosinophils Absolute: 0.2 10*3/uL (ref 0.0–0.7)
Eosinophils Relative: 3 %
HEMATOCRIT: 32.4 % — AB (ref 39.0–52.0)
HEMOGLOBIN: 10.4 g/dL — AB (ref 13.0–17.0)
LYMPHS ABS: 1.2 10*3/uL (ref 0.7–4.0)
LYMPHS PCT: 14 %
MCH: 30.6 pg (ref 26.0–34.0)
MCHC: 32.1 g/dL (ref 30.0–36.0)
MCV: 95.3 fL (ref 78.0–100.0)
MONO ABS: 0.7 10*3/uL (ref 0.1–1.0)
MONOS PCT: 8 %
NEUTROS ABS: 6.3 10*3/uL (ref 1.7–7.7)
NEUTROS PCT: 74 %
Platelets: 259 10*3/uL (ref 150–400)
RBC: 3.4 MIL/uL — ABNORMAL LOW (ref 4.22–5.81)
RDW: 15.3 % (ref 11.5–15.5)
WBC: 8.4 10*3/uL (ref 4.0–10.5)

## 2017-03-09 LAB — BASIC METABOLIC PANEL
Anion gap: 13 (ref 5–15)
BUN: 22 mg/dL — ABNORMAL HIGH (ref 6–20)
CALCIUM: 9.4 mg/dL (ref 8.9–10.3)
CHLORIDE: 101 mmol/L (ref 101–111)
CO2: 21 mmol/L — ABNORMAL LOW (ref 22–32)
CREATININE: 1.55 mg/dL — AB (ref 0.61–1.24)
GFR calc non Af Amer: 46 mL/min — ABNORMAL LOW (ref >60)
GFR, EST AFRICAN AMERICAN: 53 mL/min — AB (ref >60)
GLUCOSE: 98 mg/dL (ref 65–99)
Potassium: 4.3 mmol/L (ref 3.5–5.1)
Sodium: 135 mmol/L (ref 135–145)

## 2017-03-09 LAB — BPAM PLATELET PHERESIS
Blood Product Expiration Date: 201901152359
ISSUE DATE / TIME: 201901151410
Unit Type and Rh: 5100

## 2017-03-09 LAB — PREPARE PLATELET PHERESIS: Unit division: 0

## 2017-03-09 LAB — COOXEMETRY PANEL
CARBOXYHEMOGLOBIN: 1.5 % (ref 0.5–1.5)
Methemoglobin: 0.9 % (ref 0.0–1.5)
O2 Saturation: 55.4 %
Total hemoglobin: 12.8 g/dL (ref 12.0–16.0)

## 2017-03-09 LAB — PLATELET COUNT: Platelets: 161 10*3/uL (ref 150–400)

## 2017-03-09 LAB — HEMOGLOBIN AND HEMATOCRIT, BLOOD
HCT: 21.1 % — ABNORMAL LOW (ref 39.0–52.0)
Hemoglobin: 6.9 g/dL — CL (ref 13.0–17.0)

## 2017-03-09 LAB — HEPARIN LEVEL (UNFRACTIONATED): Heparin Unfractionated: 0.18 IU/mL — ABNORMAL LOW (ref 0.30–0.70)

## 2017-03-09 LAB — PREPARE RBC (CROSSMATCH)

## 2017-03-09 LAB — FIBRINOGEN: Fibrinogen: 316 mg/dL (ref 210–475)

## 2017-03-09 SURGERY — INSERTION OF IMPLANTABLE LEFT VENTRICULAR ASSIST DEVICE
Anesthesia: General | Site: Chest

## 2017-03-09 MED ORDER — FENTANYL CITRATE (PF) 250 MCG/5ML IJ SOLN
INTRAMUSCULAR | Status: DC | PRN
  Administered 2017-03-09 (×6): 50 ug via INTRAVENOUS
  Administered 2017-03-09: 200 ug via INTRAVENOUS
  Administered 2017-03-09: 50 ug via INTRAVENOUS

## 2017-03-09 MED ORDER — SODIUM CHLORIDE 0.9 % IJ SOLN
OROMUCOSAL | Status: DC | PRN
  Administered 2017-03-09 (×4): 4 mL via TOPICAL

## 2017-03-09 MED ORDER — PROTAMINE SULFATE 10 MG/ML IV SOLN
INTRAVENOUS | Status: AC
  Filled 2017-03-09: qty 25

## 2017-03-09 MED ORDER — DEXTROSE 5 % IV SOLN
750.0000 mg | INTRAVENOUS | Status: DC
  Filled 2017-03-09: qty 750

## 2017-03-09 MED ORDER — MAGNESIUM SULFATE 50 % IJ SOLN
40.0000 meq | INTRAMUSCULAR | Status: DC
Start: 2017-03-09 — End: 2017-03-09
  Filled 2017-03-09: qty 9.85

## 2017-03-09 MED ORDER — DEXTROSE 5 % IV SOLN
INTRAVENOUS | Status: DC | PRN
  Administered 2017-03-09: 750 mg via INTRAVENOUS

## 2017-03-09 MED ORDER — EPINEPHRINE PF 1 MG/10ML IJ SOSY
PREFILLED_SYRINGE | INTRAMUSCULAR | Status: DC | PRN
  Administered 2017-03-09 (×2): 200 ug via INTRAVENOUS
  Administered 2017-03-09: 1000 ug via INTRAVENOUS
  Administered 2017-03-09: 10 ug via INTRAVENOUS
  Administered 2017-03-09: 300 ug via INTRAVENOUS
  Administered 2017-03-09: 100 ug via INTRAVENOUS
  Administered 2017-03-09 (×2): 200 ug via INTRAVENOUS
  Administered 2017-03-09: 20 ug via INTRAVENOUS
  Administered 2017-03-09: 40 ug via INTRAVENOUS
  Administered 2017-03-09 (×2): 1000 ug via INTRAVENOUS
  Administered 2017-03-09: 40 ug via INTRAVENOUS

## 2017-03-09 MED ORDER — HEPARIN SODIUM (PORCINE) 1000 UNIT/ML IJ SOLN
INTRAMUSCULAR | Status: AC
  Filled 2017-03-09: qty 3

## 2017-03-09 MED ORDER — NITROGLYCERIN IN D5W 200-5 MCG/ML-% IV SOLN
0.0000 ug/min | INTRAVENOUS | Status: DC
  Filled 2017-03-09: qty 250

## 2017-03-09 MED ORDER — SODIUM CHLORIDE 0.9 % IV SOLN
INTRAVENOUS | Status: DC
  Filled 2017-03-09: qty 30

## 2017-03-09 MED ORDER — DOPAMINE-DEXTROSE 3.2-5 MG/ML-% IV SOLN
0.0000 ug/kg/min | INTRAVENOUS | Status: DC
  Filled 2017-03-09: qty 250

## 2017-03-09 MED ORDER — MIDAZOLAM HCL 2 MG/2ML IJ SOLN
INTRAMUSCULAR | Status: AC
  Filled 2017-03-09: qty 2

## 2017-03-09 MED ORDER — EPINEPHRINE PF 1 MG/ML IJ SOLN
INTRAVENOUS | Status: DC | PRN
  Administered 2017-03-09: 2 ug/min via INTRAVENOUS

## 2017-03-09 MED ORDER — ROCURONIUM BROMIDE 10 MG/ML (PF) SYRINGE
PREFILLED_SYRINGE | INTRAVENOUS | Status: DC | PRN
  Administered 2017-03-09: 50 mg via INTRAVENOUS
  Administered 2017-03-09 (×2): 100 mg via INTRAVENOUS
  Administered 2017-03-09: 50 mg via INTRAVENOUS

## 2017-03-09 MED ORDER — SODIUM CHLORIDE 0.9 % IV SOLN
INTRAVENOUS | Status: DC
  Filled 2017-03-09: qty 1

## 2017-03-09 MED ORDER — LACTATED RINGERS IV SOLN
INTRAVENOUS | Status: DC | PRN
  Administered 2017-03-09: 16:00:00 via INTRAVENOUS

## 2017-03-09 MED ORDER — VANCOMYCIN HCL 1000 MG IV SOLR
INTRAVENOUS | Status: AC
  Filled 2017-03-09: qty 1000

## 2017-03-09 MED ORDER — NOREPINEPHRINE BITARTRATE 1 MG/ML IV SOLN
0.0000 ug/min | INTRAVENOUS | Status: DC
  Filled 2017-03-09: qty 4

## 2017-03-09 MED ORDER — FENTANYL CITRATE (PF) 100 MCG/2ML IJ SOLN
INTRAMUSCULAR | Status: AC
  Filled 2017-03-09: qty 2

## 2017-03-09 MED ORDER — SODIUM CHLORIDE 0.9 % IR SOLN
Status: DC | PRN
  Administered 2017-03-09: 1000 mL

## 2017-03-09 MED ORDER — DOBUTAMINE IN D5W 4-5 MG/ML-% IV SOLN
2.0000 ug/kg/min | INTRAVENOUS | Status: DC
  Filled 2017-03-09: qty 250

## 2017-03-09 MED ORDER — HEPARIN SODIUM (PORCINE) 1000 UNIT/ML IJ SOLN
INTRAMUSCULAR | Status: DC | PRN
  Administered 2017-03-09: 34000 [IU] via INTRAVENOUS
  Administered 2017-03-09: 10000 [IU] via INTRAVENOUS

## 2017-03-09 MED ORDER — TRANEXAMIC ACID (OHS) BOLUS VIA INFUSION
15.0000 mg/kg | INTRAVENOUS | Status: AC
  Administered 2017-03-09: 1530 mg via INTRAVENOUS
  Filled 2017-03-09: qty 1530

## 2017-03-09 MED ORDER — ANTITHROMBIN III (HUMAN) 500 UNITS IV SOLR
500.0000 [IU] | Freq: Once | INTRAVENOUS | Status: AC
  Administered 2017-03-09: 500 [IU] via INTRAVENOUS
  Filled 2017-03-09: qty 10

## 2017-03-09 MED ORDER — LACTATED RINGERS IV SOLN
INTRAVENOUS | Status: DC | PRN
  Administered 2017-03-09: 11:00:00 via INTRAVENOUS

## 2017-03-09 MED ORDER — TRANEXAMIC ACID 1000 MG/10ML IV SOLN
1.5000 mg/kg/h | INTRAVENOUS | Status: AC
  Administered 2017-03-09: 1.5 mg/kg/h via INTRAVENOUS
  Filled 2017-03-09: qty 25

## 2017-03-09 MED ORDER — SODIUM CHLORIDE 0.9 % IV SOLN
0.0000 ug/min | INTRAVENOUS | Status: DC
  Filled 2017-03-09: qty 2

## 2017-03-09 MED ORDER — MIDAZOLAM HCL 5 MG/5ML IJ SOLN
INTRAMUSCULAR | Status: DC | PRN
  Administered 2017-03-09: 3 mg via INTRAVENOUS
  Administered 2017-03-09: 1 mg via INTRAVENOUS
  Administered 2017-03-09 (×2): 2 mg via INTRAVENOUS
  Administered 2017-03-09: 5 mg via INTRAVENOUS
  Administered 2017-03-09: 2 mg via INTRAVENOUS
  Administered 2017-03-09 (×3): 1 mg via INTRAVENOUS
  Administered 2017-03-09: 2 mg via INTRAVENOUS

## 2017-03-09 MED ORDER — VANCOMYCIN HCL 1000 MG IV SOLR
1000.0000 mg | INTRAVENOUS | Status: AC
  Administered 2017-03-09: 1000 mg
  Filled 2017-03-09: qty 1000

## 2017-03-09 MED ORDER — MIDAZOLAM HCL 2 MG/2ML IJ SOLN
INTRAMUSCULAR | Status: AC
Start: 2017-03-09 — End: 2017-03-09
  Filled 2017-03-09: qty 2

## 2017-03-09 MED ORDER — DEXMEDETOMIDINE HCL IN NACL 400 MCG/100ML IV SOLN
0.1000 ug/kg/h | INTRAVENOUS | Status: AC
  Administered 2017-03-09: 0.7 ug/kg/h via INTRAVENOUS
  Filled 2017-03-09: qty 100

## 2017-03-09 MED ORDER — FUROSEMIDE 10 MG/ML IJ SOLN
8.0000 mg/h | INTRAVENOUS | Status: DC
  Administered 2017-03-09: 10 mg/h via INTRAVENOUS
  Filled 2017-03-09: qty 20

## 2017-03-09 MED ORDER — VASOPRESSIN 20 UNIT/ML IV SOLN
0.0400 [IU]/min | INTRAVENOUS | Status: AC
  Administered 2017-03-09: 0.03 [IU]/min via INTRAVENOUS
  Filled 2017-03-09: qty 2

## 2017-03-09 MED ORDER — TRANEXAMIC ACID 1000 MG/10ML IV SOLN
1.5000 mg/kg/h | INTRAVENOUS | Status: DC
  Filled 2017-03-09: qty 10

## 2017-03-09 MED ORDER — SODIUM BICARBONATE 8.4 % IV SOLN
INTRAVENOUS | Status: DC | PRN
  Administered 2017-03-09 (×2): 50 meq via INTRAVENOUS

## 2017-03-09 MED ORDER — HEMOSTATIC AGENTS (NO CHARGE) OPTIME
TOPICAL | Status: DC | PRN
  Administered 2017-03-09 (×4): 1 via TOPICAL

## 2017-03-09 MED ORDER — SODIUM CHLORIDE 0.9 % IV SOLN
INTRAVENOUS | Status: DC | PRN
  Administered 2017-03-09: 16:00:00 via INTRAVENOUS

## 2017-03-09 MED ORDER — VANCOMYCIN HCL 1000 MG IV SOLR
INTRAVENOUS | Status: DC
  Filled 2017-03-09: qty 1000

## 2017-03-09 MED ORDER — FENTANYL CITRATE (PF) 250 MCG/5ML IJ SOLN
INTRAMUSCULAR | Status: AC
  Filled 2017-03-09: qty 10

## 2017-03-09 MED ORDER — VASOPRESSIN 20 UNIT/ML IV SOLN
INTRAVENOUS | Status: DC | PRN
  Administered 2017-03-09: 4 [IU] via INTRAVENOUS
  Administered 2017-03-09: 2 [IU] via INTRAVENOUS

## 2017-03-09 MED ORDER — TRANEXAMIC ACID (OHS) PUMP PRIME SOLUTION
2.0000 mg/kg | INTRAVENOUS | Status: DC
  Filled 2017-03-09: qty 2.04

## 2017-03-09 MED ORDER — ETOMIDATE 2 MG/ML IV SOLN
INTRAVENOUS | Status: AC
  Filled 2017-03-09: qty 10

## 2017-03-09 MED ORDER — FUROSEMIDE 10 MG/ML IJ SOLN
INTRAMUSCULAR | Status: DC | PRN
  Administered 2017-03-09: 40 mg via INTRAMUSCULAR

## 2017-03-09 MED ORDER — LACTATED RINGERS IV SOLN
INTRAVENOUS | Status: DC | PRN
  Administered 2017-03-09 (×2): via INTRAVENOUS

## 2017-03-09 MED ORDER — PROTAMINE SULFATE 10 MG/ML IV SOLN
INTRAVENOUS | Status: DC | PRN
  Administered 2017-03-09: 300 mg via INTRAVENOUS

## 2017-03-09 MED ORDER — METHYLENE BLUE 0.5 % INJ SOLN
INTRAVENOUS | Status: DC | PRN
  Administered 2017-03-09: 100 mg via INTRAVENOUS

## 2017-03-09 MED ORDER — SODIUM CHLORIDE 0.9 % IV SOLN
600.0000 mg | INTRAVENOUS | Status: AC
  Administered 2017-03-09: 600 mg via INTRAVENOUS
  Filled 2017-03-09: qty 600

## 2017-03-09 MED ORDER — HEPARIN SODIUM (PORCINE) 1000 UNIT/ML IJ SOLN
INTRAMUSCULAR | Status: AC
  Filled 2017-03-09: qty 1

## 2017-03-09 MED ORDER — CALCIUM CHLORIDE 10 % IV SOLN
INTRAVENOUS | Status: DC | PRN
  Administered 2017-03-09 (×2): 500 mg via INTRAVENOUS

## 2017-03-09 MED ORDER — SODIUM CHLORIDE 0.9 % IV SOLN
INTRAVENOUS | Status: DC | PRN
  Administered 2017-03-09: .8 [IU]/h via INTRAVENOUS

## 2017-03-09 MED ORDER — MILRINONE LACTATE IN DEXTROSE 20-5 MG/100ML-% IV SOLN
0.3000 ug/kg/min | INTRAVENOUS | Status: DC
  Filled 2017-03-09: qty 100

## 2017-03-09 MED ORDER — LACTATED RINGERS IV SOLN
INTRAVENOUS | Status: DC | PRN
  Administered 2017-03-09 (×2): via INTRAVENOUS

## 2017-03-09 MED ORDER — DEXTROSE 5 % IV SOLN
1.5000 g | INTRAVENOUS | Status: AC
  Administered 2017-03-09: 1.5 g via INTRAVENOUS
  Filled 2017-03-09: qty 1.5

## 2017-03-09 MED ORDER — EPINEPHRINE PF 1 MG/ML IJ SOLN
0.0000 ug/min | INTRAVENOUS | Status: DC
  Filled 2017-03-09: qty 4

## 2017-03-09 MED ORDER — PHENYLEPHRINE HCL 10 MG/ML IJ SOLN
INTRAMUSCULAR | Status: DC | PRN
  Administered 2017-03-09 (×2): 80 ug via INTRAVENOUS

## 2017-03-09 MED ORDER — FLUCONAZOLE IN SODIUM CHLORIDE 400-0.9 MG/200ML-% IV SOLN
400.0000 mg | INTRAVENOUS | Status: DC
  Filled 2017-03-09: qty 200

## 2017-03-09 MED ORDER — SODIUM CHLORIDE 0.9 % IV SOLN: Freq: Once | INTRAVENOUS | Status: DC

## 2017-03-09 MED ORDER — LIDOCAINE 2% (20 MG/ML) 5 ML SYRINGE
INTRAMUSCULAR | Status: DC | PRN
  Administered 2017-03-09: 100 mg via INTRAVENOUS

## 2017-03-09 MED ORDER — HYDROCORTISONE NA SUCCINATE PF 250 MG IJ SOLR
INTRAMUSCULAR | Status: AC
  Filled 2017-03-09: qty 250

## 2017-03-09 MED ORDER — VANCOMYCIN HCL 10 G IV SOLR
1500.0000 mg | INTRAVENOUS | Status: AC
  Administered 2017-03-09: 1500 mg via INTRAVENOUS
  Filled 2017-03-09: qty 1500

## 2017-03-09 MED ORDER — NOREPINEPHRINE BITARTRATE 1 MG/ML IV SOLN
0.0000 ug/min | INTRAVENOUS | Status: AC
  Administered 2017-03-09: 5 ug/min via INTRAVENOUS
  Filled 2017-03-09: qty 4

## 2017-03-09 MED ORDER — VANCOMYCIN HCL 1000 MG IV SOLR
INTRAVENOUS | Status: DC | PRN
  Administered 2017-03-09: 1000 mL

## 2017-03-09 MED ORDER — CEFAZOLIN SODIUM 1 G IJ SOLR
INTRAMUSCULAR | Status: AC
Start: 2017-03-09 — End: 2017-03-09
  Filled 2017-03-09: qty 20

## 2017-03-09 MED ORDER — ETOMIDATE 2 MG/ML IV SOLN
INTRAVENOUS | Status: DC | PRN
Start: 2017-03-09 — End: 2017-03-09
  Administered 2017-03-09: 12 mg via INTRAVENOUS

## 2017-03-09 MED ORDER — MIDAZOLAM HCL 10 MG/2ML IJ SOLN
INTRAMUSCULAR | Status: AC
  Filled 2017-03-09: qty 2

## 2017-03-09 MED ORDER — ALBUMIN HUMAN 5 % IV SOLN
INTRAVENOUS | Status: DC | PRN
  Administered 2017-03-09: 16:00:00 via INTRAVENOUS

## 2017-03-09 MED ORDER — PHENYLEPHRINE HCL 10 MG/ML IJ SOLN
INTRAVENOUS | Status: DC | PRN
  Administered 2017-03-09: 50 ug/min via INTRAVENOUS

## 2017-03-09 MED ORDER — POTASSIUM CHLORIDE 2 MEQ/ML IV SOLN
80.0000 meq | INTRAVENOUS | Status: DC
Start: 2017-03-09 — End: 2017-03-09
  Filled 2017-03-09: qty 40

## 2017-03-09 MED ORDER — SODIUM CHLORIDE 0.9 % IJ SOLN
INTRAMUSCULAR | Status: AC
  Filled 2017-03-09: qty 30

## 2017-03-09 SURGICAL SUPPLY — 124 items
ADAPTER CARDIO PERF ANTE/RETRO (ADAPTER) IMPLANT
ADAPTER DLP PERFUSION .25INX2I (MISCELLANEOUS) ×4 IMPLANT
ADPR CRDPLG .25X.64 STRL (MISCELLANEOUS) ×2
ADPR PRFSN 84XANTGRD RTRGD (ADAPTER)
ANTEGRADE CPLG (MISCELLANEOUS) IMPLANT
ATTRACTOMAT 16X20 MAGNETIC DRP (DRAPES) ×4 IMPLANT
BAG DECANTER FOR FLEXI CONT (MISCELLANEOUS) ×12 IMPLANT
BLADE STERNUM SYSTEM 6 (BLADE) ×4 IMPLANT
BLADE SURG 12 STRL SS (BLADE) ×4 IMPLANT
BLADE SURG 15 STRL LF DISP TIS (BLADE) IMPLANT
BLADE SURG 15 STRL SS (BLADE)
CANISTER SUCT 3000ML PPV (MISCELLANEOUS) ×4 IMPLANT
CANNULA ARTERIAL NVNT 3/8 20FR (MISCELLANEOUS) ×4 IMPLANT
CANNULA MC2 2 STG 36/46 NON-V (CANNULA) IMPLANT
CANNULA SUMP PERICARDIAL (CANNULA) ×2 IMPLANT
CANNULA VENOUS 2 STG 34/46 (CANNULA) ×2
CANNULA VENOUS LOW PROF 34X46 (CANNULA) ×4 IMPLANT
CATH CPB KIT VANTRIGT (MISCELLANEOUS) IMPLANT
CATH FOLEY 2WAY SLVR  5CC 14FR (CATHETERS) ×2
CATH FOLEY 2WAY SLVR 5CC 14FR (CATHETERS) ×2 IMPLANT
CATH HEART VENT LEFT (CATHETERS) IMPLANT
CATH HYDRAGLIDE XL THORACIC (CATHETERS) ×4 IMPLANT
CATH ROBINSON RED A/P 18FR (CATHETERS) ×10 IMPLANT
CATH THORACIC 28FR (CATHETERS) IMPLANT
CATH THORACIC 36FR RT ANG (CATHETERS) IMPLANT
CHLORAPREP W/TINT 26ML (MISCELLANEOUS) ×4 IMPLANT
CONN ST 1/4X3/8  BEN (MISCELLANEOUS) ×2
CONN ST 1/4X3/8 BEN (MISCELLANEOUS) IMPLANT
CONT SPEC 4OZ CLIKSEAL STRL BL (MISCELLANEOUS) ×2 IMPLANT
COVER BACK TABLE 60X90IN (DRAPES) ×4 IMPLANT
COVER SURGICAL LIGHT HANDLE (MISCELLANEOUS) ×4 IMPLANT
COVER TABLE BACK 60X90 (DRAPES) ×2 IMPLANT
CRADLE DONUT ADULT HEAD (MISCELLANEOUS) ×4 IMPLANT
DRAIN CHANNEL 28F RND 3/8 FF (WOUND CARE) IMPLANT
DRAIN CHANNEL 32F RND 10.7 FF (WOUND CARE) IMPLANT
DRAPE BILATERAL SPLIT (DRAPES) ×4 IMPLANT
DRAPE CAMERA CLOSED 9X96 (DRAPES) ×4 IMPLANT
DRAPE CV SPLIT W-CLR ANES SCRN (DRAPES) ×4 IMPLANT
DRAPE INCISE IOBAN 66X45 STRL (DRAPES) ×6 IMPLANT
DRAPE SLUSH/WARMER DISC (DRAPES) ×4 IMPLANT
DRSG AQUACEL AG ADV 3.5X14 (GAUZE/BANDAGES/DRESSINGS) ×4 IMPLANT
ELECT BLADE 4.0 EZ CLEAN MEGAD (MISCELLANEOUS) ×4
ELECT BLADE 6.5 EXT (BLADE) ×4 IMPLANT
ELECT CAUTERY BLADE 6.4 (BLADE) ×4 IMPLANT
ELECT REM PT RETURN 9FT ADLT (ELECTROSURGICAL) ×4
ELECTRODE BLDE 4.0 EZ CLN MEGD (MISCELLANEOUS) ×2 IMPLANT
ELECTRODE REM PT RTRN 9FT ADLT (ELECTROSURGICAL) ×2 IMPLANT
FELT TEFLON 6X6 (MISCELLANEOUS) ×4 IMPLANT
FLOSEAL 10ML (HEMOSTASIS) ×2 IMPLANT
GAUZE SPONGE 4X4 12PLY STRL (GAUZE/BANDAGES/DRESSINGS) ×8 IMPLANT
GAUZE SPONGE 4X4 12PLY STRL LF (GAUZE/BANDAGES/DRESSINGS) ×2 IMPLANT
GLOVE BIO SURGEON STRL SZ 6 (GLOVE) ×12 IMPLANT
GLOVE BIO SURGEON STRL SZ 6.5 (GLOVE) ×4 IMPLANT
GLOVE BIO SURGEON STRL SZ7.5 (GLOVE) ×16 IMPLANT
GLOVE BIO SURGEONS STRL SZ 6.5 (GLOVE) ×4
GLOVE BIOGEL PI IND STRL 6.5 (GLOVE) IMPLANT
GLOVE BIOGEL PI INDICATOR 6.5 (GLOVE) ×6
GOWN STRL REUS W/ TWL LRG LVL3 (GOWN DISPOSABLE) ×8 IMPLANT
GOWN STRL REUS W/ TWL XL LVL3 (GOWN DISPOSABLE) ×4 IMPLANT
GOWN STRL REUS W/TWL LRG LVL3 (GOWN DISPOSABLE) ×24
GOWN STRL REUS W/TWL XL LVL3 (GOWN DISPOSABLE) ×8
HEMOSTAT POWDER SURGIFOAM 1G (HEMOSTASIS) ×16 IMPLANT
HEMOSTAT SURGICEL 2X14 (HEMOSTASIS) IMPLANT
INSERT FOGARTY XLG (MISCELLANEOUS) ×2 IMPLANT
KIT BASIN OR (CUSTOM PROCEDURE TRAY) ×4 IMPLANT
KIT LVAD HEARTMATE 3 W-CNTRL (Prosthesis & Implant Heart) ×1 IMPLANT
KIT LVAD HEARTMATE III W-CNTRL (Prosthesis & Implant Heart) ×1 IMPLANT
KIT ROOM TURNOVER OR (KITS) ×4 IMPLANT
KIT SUCTION CATH 14FR (SUCTIONS) ×6 IMPLANT
LEAD PACING MYOCARDI (MISCELLANEOUS) ×2 IMPLANT
LINE VENT (MISCELLANEOUS) ×2 IMPLANT
NS IRRIG 1000ML POUR BTL (IV SOLUTION) ×20 IMPLANT
PACK OPEN HEART (CUSTOM PROCEDURE TRAY) ×4 IMPLANT
PAD ARMBOARD 7.5X6 YLW CONV (MISCELLANEOUS) ×8 IMPLANT
PAD DEFIB R2 (MISCELLANEOUS) ×4 IMPLANT
PUNCH AORTIC ROTATE 4.5MM 8IN (MISCELLANEOUS) ×4 IMPLANT
SEALANT SURG COSEAL 8ML (VASCULAR PRODUCTS) ×6 IMPLANT
SET CARDIOPLEGIA MPS 5001102 (MISCELLANEOUS) ×2 IMPLANT
SHEATH AVANTI 11CM 5FR (MISCELLANEOUS) IMPLANT
SPONGE LAP 18X18 X RAY DECT (DISPOSABLE) ×6 IMPLANT
STOPCOCK 4 WAY LG BORE MALE ST (IV SETS) ×6 IMPLANT
SUCKER INTRACARDIAC WEIGHTED (SUCKER) ×4 IMPLANT
SUT BONE WAX W31G (SUTURE) ×2 IMPLANT
SUT ETHIBOND 2 0 SH (SUTURE) ×24
SUT ETHIBOND 2 0 SH 36X2 (SUTURE) ×10 IMPLANT
SUT ETHIBOND 5 LR DA (SUTURE) ×8 IMPLANT
SUT ETHIBOND NAB MH 2-0 36IN (SUTURE) ×54 IMPLANT
SUT PROLENE 3 0 RB 1 (SUTURE) ×4 IMPLANT
SUT PROLENE 3 0 SH DA (SUTURE) ×10 IMPLANT
SUT PROLENE 4 0 RB 1 (SUTURE) ×88
SUT PROLENE 4 0 SH DA (SUTURE) ×6 IMPLANT
SUT PROLENE 4-0 RB1 .5 CRCL 36 (SUTURE) ×8 IMPLANT
SUT PROLENE 5 0 C1 (SUTURE) ×4 IMPLANT
SUT PROLENE 6 0 C 1 30 (SUTURE) ×4 IMPLANT
SUT SILK  1 MH (SUTURE) ×12
SUT SILK 0 TIES 10X30 (SUTURE) ×2 IMPLANT
SUT SILK 1 MH (SUTURE) ×8 IMPLANT
SUT SILK 1 TIES 10X30 (SUTURE) ×6 IMPLANT
SUT SILK 2 0 SH CR/8 (SUTURE) ×8 IMPLANT
SUT STEEL SZ 6 DBL 3X14 BALL (SUTURE) ×10 IMPLANT
SUT TEM PAC WIRE 2 0 SH (SUTURE) IMPLANT
SUT VIC AB 1 CTX 18 (SUTURE) ×2 IMPLANT
SUT VIC AB 1 CTX 36 (SUTURE) ×8
SUT VIC AB 1 CTX36XBRD ANBCTR (SUTURE) ×4 IMPLANT
SUT VIC AB 2-0 CTX 27 (SUTURE) ×8 IMPLANT
SUT VIC AB 3-0 SH 8-18 (SUTURE) ×4 IMPLANT
SUT VIC AB 3-0 X1 27 (SUTURE) ×10 IMPLANT
SUT VICRYL 2 TP 1 (SUTURE) IMPLANT
SYR 50ML LL SCALE MARK (SYRINGE) ×4 IMPLANT
SYSTEM SAHARA CHEST DRAIN ATS (WOUND CARE) ×4 IMPLANT
TAPE CLOTH SURG 4X10 WHT LF (GAUZE/BANDAGES/DRESSINGS) ×2 IMPLANT
TAPE STRIPS DRAPE STRL (GAUZE/BANDAGES/DRESSINGS) ×4 IMPLANT
TOWEL GREEN STERILE (TOWEL DISPOSABLE) ×4 IMPLANT
TOWEL GREEN STERILE FF (TOWEL DISPOSABLE) ×4 IMPLANT
TRAY CATH LUMEN 1 20CM STRL (SET/KITS/TRAYS/PACK) ×6 IMPLANT
TRAY FOLEY SILVER 14FR TEMP (SET/KITS/TRAYS/PACK) ×4 IMPLANT
TUBE CONNECTING 12'X1/4 (SUCTIONS) ×2
TUBE CONNECTING 12X1/4 (SUCTIONS) ×4 IMPLANT
TUBE SUCT INTRACARD DLP 20F (MISCELLANEOUS) ×2 IMPLANT
TUBING ART PRESS 48 MALE/FEM (TUBING) ×6 IMPLANT
UNDERPAD 30X30 (UNDERPADS AND DIAPERS) ×4 IMPLANT
VENT LEFT HEART 12002 (CATHETERS) ×4
WATER STERILE IRR 1000ML POUR (IV SOLUTION) ×8 IMPLANT
YANKAUER SUCT BULB TIP NO VENT (SUCTIONS) ×6 IMPLANT

## 2017-03-10 ENCOUNTER — Encounter (HOSPITAL_COMMUNITY): Payer: Self-pay | Admitting: Cardiothoracic Surgery

## 2017-03-10 LAB — BPAM RBC
Blood Product Expiration Date: 201901302359
Blood Product Expiration Date: 201901312359
Blood Product Expiration Date: 201902012359
Blood Product Expiration Date: 201902012359
ISSUE DATE / TIME: 201901151103
ISSUE DATE / TIME: 201901151103
ISSUE DATE / TIME: 201901151410
ISSUE DATE / TIME: 201901151410
Unit Type and Rh: 600
Unit Type and Rh: 600
Unit Type and Rh: 600
Unit Type and Rh: 600

## 2017-03-10 LAB — POCT I-STAT 7, (LYTES, BLD GAS, ICA,H+H)
ACID-BASE DEFICIT: 4 mmol/L — AB (ref 0.0–2.0)
Acid-base deficit: 5 mmol/L — ABNORMAL HIGH (ref 0.0–2.0)
BICARBONATE: 25.3 mmol/L (ref 20.0–28.0)
Bicarbonate: 24.9 mmol/L (ref 20.0–28.0)
CALCIUM ION: 1.37 mmol/L (ref 1.15–1.40)
Calcium, Ion: 1.38 mmol/L (ref 1.15–1.40)
HCT: 35 % — ABNORMAL LOW (ref 39.0–52.0)
HEMATOCRIT: 35 % — AB (ref 39.0–52.0)
HEMOGLOBIN: 11.9 g/dL — AB (ref 13.0–17.0)
Hemoglobin: 11.9 g/dL — ABNORMAL LOW (ref 13.0–17.0)
O2 SAT: 67 %
O2 SAT: 47 %
PCO2 ART: 76.4 mmHg — AB (ref 32.0–48.0)
PH ART: 7.193 — AB (ref 7.350–7.450)
PH ART: 7.128 — AB (ref 7.350–7.450)
PO2 ART: 35 mmHg — AB (ref 83.0–108.0)
POTASSIUM: 4.9 mmol/L (ref 3.5–5.1)
Potassium: 5 mmol/L (ref 3.5–5.1)
SODIUM: 142 mmol/L (ref 135–145)
Sodium: 141 mmol/L (ref 135–145)
TCO2: 27 mmol/L (ref 22–32)
TCO2: 28 mmol/L (ref 22–32)
pCO2 arterial: 65.1 mmHg (ref 32.0–48.0)
pO2, Arterial: 45 mmHg — ABNORMAL LOW (ref 83.0–108.0)

## 2017-03-10 LAB — BPAM FFP
Blood Product Expiration Date: 201901192359
Blood Product Expiration Date: 201901192359
Blood Product Expiration Date: 201901202359
Blood Product Expiration Date: 201901202359
ISSUE DATE / TIME: 201901151046
ISSUE DATE / TIME: 201901151046
ISSUE DATE / TIME: 201901151046
ISSUE DATE / TIME: 201901151046
Unit Type and Rh: 6200
Unit Type and Rh: 6200
Unit Type and Rh: 6200
Unit Type and Rh: 6200

## 2017-03-10 LAB — PREPARE CRYOPRECIPITATE
Unit division: 0
Unit division: 0

## 2017-03-10 LAB — PREPARE FRESH FROZEN PLASMA
Unit division: 0
Unit division: 0
Unit division: 0
Unit division: 0

## 2017-03-10 LAB — BPAM CRYOPRECIPITATE
Blood Product Expiration Date: 201901151956
Blood Product Expiration Date: 201901151956
ISSUE DATE / TIME: 201901151429
ISSUE DATE / TIME: 201901151429
Unit Type and Rh: 6200
Unit Type and Rh: 6200

## 2017-03-10 LAB — TYPE AND SCREEN
ABO/RH(D): A NEG
Antibody Screen: NEGATIVE
Unit division: 0
Unit division: 0
Unit division: 0
Unit division: 0

## 2017-03-10 MED FILL — Magnesium Sulfate Inj 50%: INTRAMUSCULAR | Qty: 10 | Status: AC

## 2017-03-10 MED FILL — Potassium Chloride Inj 2 mEq/ML: INTRAVENOUS | Qty: 40 | Status: AC

## 2017-03-10 MED FILL — Heparin Sodium (Porcine) Inj 1000 Unit/ML: INTRAMUSCULAR | Qty: 30 | Status: AC

## 2017-03-11 ENCOUNTER — Telehealth (HOSPITAL_COMMUNITY): Payer: Self-pay | Admitting: *Deleted

## 2017-03-11 NOTE — Telephone Encounter (Signed)
Received faxed copy of [facility] certificate [facility].  Certificate completed/signed and faxed back to [phone].

## 2017-03-12 MED FILL — Sodium Chloride IV Soln 0.9%: INTRAVENOUS | Qty: 2000 | Status: AC

## 2017-03-12 MED FILL — Calcium Chloride Inj 10%: INTRAVENOUS | Qty: 10 | Status: AC

## 2017-03-12 MED FILL — Lidocaine HCl IV Inj 20 MG/ML: INTRAVENOUS | Qty: 5 | Status: AC

## 2017-03-12 MED FILL — Sodium Bicarbonate IV Soln 8.4%: INTRAVENOUS | Qty: 100 | Status: AC

## 2017-03-12 MED FILL — Heparin Sodium (Porcine) Inj 1000 Unit/ML: INTRAMUSCULAR | Qty: 30 | Status: AC

## 2017-03-12 MED FILL — Heparin Sodium (Porcine) Inj 1000 Unit/ML: INTRAMUSCULAR | Qty: 20 | Status: AC

## 2017-03-12 MED FILL — Mannitol IV Soln 20%: INTRAVENOUS | Qty: 500 | Status: AC

## 2017-03-12 MED FILL — Electrolyte-R (PH 7.4) Solution: INTRAVENOUS | Qty: 4000 | Status: AC

## 2017-03-19 LAB — CUP PACEART INCLINIC DEVICE CHECK
Battery Voltage: 2.96 V
Brady Statistic AS VP Percent: 29.48 %
Brady Statistic AS VS Percent: 0.06 %
Date Time Interrogation Session: 20181227191136
HIGH POWER IMPEDANCE MEASURED VALUE: 75 Ohm
Implantable Lead Implant Date: 20180202
Implantable Lead Location: 753860
Implantable Lead Model: 3830
Implantable Lead Model: 5076
Lead Channel Impedance Value: 304 Ohm
Lead Channel Impedance Value: 304 Ohm
Lead Channel Impedance Value: 342 Ohm
Lead Channel Pacing Threshold Amplitude: 0.5 V
Lead Channel Pacing Threshold Pulse Width: 0.4 ms
Lead Channel Setting Pacing Amplitude: 1.5 V
Lead Channel Setting Pacing Pulse Width: 0.4 ms
Lead Channel Setting Sensing Sensitivity: 0.3 mV
MDC IDC LEAD IMPLANT DT: 20180202
MDC IDC LEAD IMPLANT DT: 20180202
MDC IDC LEAD LOCATION: 753858
MDC IDC LEAD LOCATION: 753859
MDC IDC MSMT BATTERY REMAINING LONGEVITY: 76 mo
MDC IDC MSMT LEADCHNL LV IMPEDANCE VALUE: 532 Ohm
MDC IDC MSMT LEADCHNL RA IMPEDANCE VALUE: 418 Ohm
MDC IDC MSMT LEADCHNL RA SENSING INTR AMPL: 5.25 mV
MDC IDC MSMT LEADCHNL RV IMPEDANCE VALUE: 418 Ohm
MDC IDC MSMT LEADCHNL RV PACING THRESHOLD AMPLITUDE: 1 V
MDC IDC MSMT LEADCHNL RV PACING THRESHOLD PULSEWIDTH: 0.4 ms
MDC IDC MSMT LEADCHNL RV SENSING INTR AMPL: 11.5 mV
MDC IDC PG IMPLANT DT: 20180202
MDC IDC SET LEADCHNL LV PACING AMPLITUDE: 1 V
MDC IDC SET LEADCHNL LV PACING PULSEWIDTH: 1.5 ms
MDC IDC SET LEADCHNL RV PACING AMPLITUDE: 2 V
MDC IDC STAT BRADY AP VP PERCENT: 70.44 %
MDC IDC STAT BRADY AP VS PERCENT: 0.01 %
MDC IDC STAT BRADY RA PERCENT PACED: 70.32 %
MDC IDC STAT BRADY RV PERCENT PACED: 99.78 %

## 2017-03-26 NOTE — Anesthesia Procedure Notes (Signed)
Arterial Line Insertion Start/End01/08/2017 8:20 AM, 03/14/2017 8:29 AM Performed by: Adair LaundryPaxton, Lyden Redner A, CRNA, CRNA  Patient location: Pre-op. Preanesthetic checklist: patient identified, IV checked, risks and benefits discussed, surgical consent and pre-op evaluation Lidocaine 1% used for infiltration and patient sedated Left, radial was placed Catheter size: 20 G Hand hygiene performed , maximum sterile barriers used  and Seldinger technique used  Attempts: 1 Procedure performed without using ultrasound guided technique. Following insertion, dressing applied and Biopatch. Post procedure assessment: normal  Patient tolerated the procedure well with no immediate complications.

## 2017-03-26 NOTE — Anesthesia Postprocedure Evaluation (Signed)
Anesthesia Post Note  Patient: Hermenia BersLewis R Plessinger  Procedure(s) Performed: INSERTION OF IMPLANTABLE LEFT VENTRICULAR ASSIST DEVICE (N/A Chest) AORTIC VALVE REPAIR (N/A ) TRANSESOPHAGEAL ECHOCARDIOGRAM (TEE) (N/A )     Patient location during evaluation: SICU Anesthesia Type: General Level of consciousness: comatose and patient remains intubated per anesthesia plan Vital Signs Assessment: vitals unstable Respiratory status: patient on ventilator - see flowsheet for VS and patient remains intubated per anesthesia plan Comments: Patient expired in SICU shortly after arrival.    Last Vitals:  Vitals:   03/11/2017 1700 02/23/2017 1830  BP:    Pulse:    Resp:    Temp:    SpO2: (!) 68% (!) 22%    Last Pain:  Vitals:   03/03/2017 0450  TempSrc: Oral  PainSc:                  Peja Allender COKER

## 2017-03-26 NOTE — Anesthesia Procedure Notes (Signed)
Procedure Name: Intubation Date/Time: 2017-03-31 10:54 AM Performed by: Colin Benton, CRNA Pre-anesthesia Checklist: Patient identified, Emergency Drugs available, Suction available and Patient being monitored Patient Re-evaluated:Patient Re-evaluated prior to induction Oxygen Delivery Method: Circle system utilized Preoxygenation: Pre-oxygenation with 100% oxygen Induction Type: IV induction Ventilation: Mask ventilation without difficulty and Oral airway inserted - appropriate to patient size Laryngoscope Size: Mac and 4 Grade View: Grade I Tube type: Subglottic suction tube Tube size: 8.0 mm Number of attempts: 1 Airway Equipment and Method: Stylet Placement Confirmation: ETT inserted through vocal cords under direct vision and positive ETCO2 Secured at: 24 cm Tube secured with: Tape Dental Injury: Teeth and Oropharynx as per pre-operative assessment

## 2017-03-26 NOTE — Progress Notes (Signed)
Pre Procedure note for inpatients:   [name] LuriaLewis R [name] has been scheduled for Procedure(s): RIGHT HEART CATH (N/A) today. The various methods of treatment have been discussed with the patient. After consideration of the risks, benefits and treatment options the patient has consented to the planned procedure.   The patient has been seen and labs reviewed. There are no changes in the patient's condition to prevent proceeding with the planned procedure today.  Recent labs:  Lab Results  Component Value Date   WBC 7.0 03/08/2017   HGB 9.4 (L) 03/08/2017   HCT 30.1 (L) 03/08/2017   PLT 230 03/08/2017   GLUCOSE 94 03/08/2017   CHOL 148 02/25/2017   TRIG 56 02/25/2017   HDL 54 02/25/2017   LDLDIRECT 159.2 02/04/2010   LDLCALC 83 02/25/2017   ALT 25 02/25/2017   AST 29 02/25/2017   NA 133 (L) 03/08/2017   K 4.3 03/08/2017   CL 102 03/08/2017   CREATININE 1.53 (H) 03/08/2017   BUN 20 03/08/2017   CO2 23 03/08/2017   TSH 1.187 02/25/2017   TSH 1.194 02/25/2017   PSA 1.77 03/19/2016   INR 1.06 03/08/2017   HGBA1C 4.9 03/08/2017    Mikey BussingPeter Van Trigt III, MD 03/04/2017 5:44 AM

## 2017-03-26 NOTE — Anesthesia Procedure Notes (Signed)
Central Venous Catheter Insertion Performed by: Kipp BroodJoslin, Alsace Dowd, MD, anesthesiologist Start/End01/21/2019 8:55 AM, 03/13/2017 9:00 AM Patient location: Pre-op. Preanesthetic checklist: patient identified, IV checked, site marked, risks and benefits discussed, surgical consent, monitors and equipment checked, pre-op evaluation, timeout performed and anesthesia consent Hand hygiene performed  and maximum sterile barriers used  PA cath was placed.Swan type:thermodilution Procedure performed without using ultrasound guided technique. Ultrasound Notes:needle tip was noted to be adjacent to the nerve/plexus identified, no ultrasound evidence of intravascular and/or intraneural injection and image(s) printed for medical record Attempts: 1 Following insertion, line sutured, dressing applied and Biopatch. Post procedure assessment: blood return through all ports, free fluid flow and no air  Patient tolerated the procedure well with no immediate complications.

## 2017-03-26 NOTE — Discharge Summary (Addendum)
NAMBascom [name]:  [name], [name]                 ACCOUNT NO.:  [id number]  MEDICAL RECORD NO.:  [id number]  LOCATION:  [id number]                        FACILITY:  [facility]  PHYSICIAN:  Kerin PernaPeter Van Trigt, M.D.  DATE OF BIRTH:  05/14/1955  DATE OF ADMISSION:  01/27/2017 DATE OF DISCHARGE:  03/08/2017                              DISCHARGE SUMMARY   DISCHARGE-DEATH SUMMARY  ADMISSION DIAGNOSES: 1. Ischemic cardiomyopathy, ejection fraction 20% with acute-on-     chronic systolic heart failure. 2. Severe pulmonary hypertension with pulmonary vascular resistance,     7.6 Woods unit on initial right heart catheterization. 3. History of ventricular tachycardia, status post implantation of     automatic implantable cardioverter defibrillator with multiple     shocks (24), delivered in January 2018. 4. Mild-moderate right ventricular dysfunction and mild tricuspid     regurgitation. 5. Moderate aortic insufficiency. 6. History of smoking with moderate chronic obstructive pulmonary     disease. 7. History of coronary artery disease, status post previous     percutaneous coronary intervention, on chronic Plavix. 8. Chronic kidney disease, stage III with creatinine 1.8-2.0. 9. 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  OPERATIONS AND PROCEDURES: 1. Right heart catheterization by [name] on February 24, 2017 and     March 02, 2017. 2. Implantation of HeartMate 3 left ventricular assist device by Dr.     Donata ClayVan Trigt on March 09, 2017. 3. Transesophageal echocardiography performed by Dr. Noreene LarssonJoslin, Cardiac     Anesthesia, during the HeartMate 3 implantation.  HOSPITAL COURSE:  The patient is a 62 year old Caucasian male, reformed smoker with ischemic cardiomyopathy and ejection fraction of 20%.  He had been followed for 2 years by the [facility] and had been admitted a year ago for several runs of VT, requiring  multiple shocks(24) in a single day.  He had been managed as an outpatient with oral medications until recently when he developed increased symptoms of decompensated heart failure with dyspnea walking more than 25-30 feet, orthopnea, loss of appetite, and swelling in his abdomen and lower extremities.  He was admitted on February 22, 2017, for IV diuresis.  He did not have ventricular arrhythmias.  He underwent a right heart catheterization on February 24, 2017, which showed low cardiac output, cardiac index 1.8, and co-ox 51%, and severe pulmonary hypertension with PA pressure is 88/40 with PVR of 7.6.  The patient was placed on IV milrinone, diuretics and sildenafil to try to improve his pulmonary hypertension and cardiac output.  Repeat catheterization by [name] on March 02, 2017, demonstrated improvement in PA pressure, but still severely elevated at 70/22 with increasing cardiac output of 2.1 and a pulmonary vascular resistance of 3.1, on milrinone.  The patient was then fully evaluated by the Mechanical Cardiac Support Team including Cardiology, Cardiac Surgery, Social Worker, VAD coordinators, and Palliative Care.  He was felt to be a candidate for destination therapy, implantable left ventricular assist device, HeartMate 3.  After Plavix washout and optimization of his renal function, he was brought to the operating room on March 09, 2017,  where he underwent implantation of the HeartMate 3.  The aortic valve was oversewn to treat the severe AI, which would not have been compatible with good function of his VAD.  Findings at surgery include severe dilatation on the right side of the heart with a very enlarged and thickened pulmonary artery due to longstanding severe pulmonary hypertension.  The implant procedure went well and the patient was weaned off cardiopulmonary bypass without difficulty on moderate amounts of inotropic support including milrinone and low-dose  epinephrine.  He was on inhaled nitric oxide as well. Initial hemodynamics were stable with good VAD flows of 4.5 L/minute, normal blood pressure.  His oxygen saturations, however, started to decrease after we were off bypass approximately 30 minutes.  His pulmonary artery pressures were improved from preoperative, but still moderately elevated.  The aortic valve was competent and adequately repaired - closed.  The mitral regurgitation was significantly improved and the tricuspid regurgitation was minimal.  RV function was satisfactory.  However, his oxygen saturations continued to drop and this was followed by massive amounts of pale, mucoid fluid, which filled his ET tube and ventilator circuit and required changing out five times. During this period of difficulty with his airway and oxygenation, his PO2 was documented as low as 45.  Despite this, his LVAD flows remained normal and blood pressure tolerated this fairly well.  A ventilator from the ICU was brought to the operating room to improve the ventilator strategies.  The patient was given steroids and antibiotics, and Lasix; however, his airway continued to fill with fluid.  It was clear that the patient could not have his chest closed because of the large pulmonary tidal volumes that were required to provide adequate oxygenation.  The protamine had been administered to reverse heparin and the drainage tubes in each pleural space and the mediastinum had been placed and the chest wall soft tissue was closed over the sternum, and the patient was transported back to the ICU.  However, at this point, his RV function started to deteriorate with elevated CVPs, which resulted in lower VAD flows.  His PO2 remained in the 40s.  It was discussed placing the patient on ECMO.  A call to the [facility] determined that the ECMO transport teams were out at other regions in the state and in order to support the patient, he would have to  be placed back on cardiopulmonary bypass.  It was my feeling that the acute lung injury that was leading to his low oxygen saturation and excessive airway secretions pouring out of his ET tube was due to the cardiopulmonary bypass-induced inflammation and it would be futile to keep the patient on several more hours of cardiopulmonary bypass, waiting for potential ECMO transport.  For that reason, the patient was given palliative care and support was stopped after informing the patient's family of the situation.  FINAL DIAGNOSES: 1. Acute-on-chronic systolic heart failure with ischemic     cardiomyopathy, ejection fraction 20%. 2. Moderate chronic obstructive pulmonary disease. 3. Severe pulmonary hypertension with pulmonary vascular disease. 4. Chronic kidney disease, baseline creatinine 1.8-2.0. 5. History of ventricular tachycardia with history of multiple VT     shocks, so many that he developed posttraumatic stress disorder. 6. Acute lung injury from combination of severe pulmonary hypertension     and cardiopulmonary bypass, which resulted in total filling of his     airway and out the ET tube into the ventilator circuit despite     normal hemodynamic  parameters and satisfactory LVAD function.     Kerin Perna, M.D.     PV/MEDQ  D:  03/18/2017  T:  03/19/2017  Job:  [id number]

## 2017-03-26 NOTE — Anesthesia Procedure Notes (Signed)
Central Venous Catheter Insertion Performed by: Kipp BroodJoslin, Olamide Lahaie, MD, anesthesiologist Start/End01/22/2019 8:45 AM, 03/21/2017 8:50 AM Patient location: Pre-op. Preanesthetic checklist: patient identified, IV checked, site marked, risks and benefits discussed, surgical consent, monitors and equipment checked, pre-op evaluation, timeout performed and anesthesia consent Lidocaine 1% used for infiltration and patient sedated Hand hygiene performed  and maximum sterile barriers used  Catheter size: 8 Fr Total catheter length 16. Central line was placed.Double lumen Procedure performed using ultrasound guided technique. Ultrasound Notes:image(s) printed for medical record Attempts: 1 Following insertion, dressing applied and line sutured. Post procedure assessment: blood return through all ports  Patient tolerated the procedure well with no immediate complications.

## 2017-03-26 NOTE — Anesthesia Procedure Notes (Addendum)
Central Venous Catheter Insertion Performed by: Kipp BroodJoslin, Manu Rubey, MD, anesthesiologist Start/End04/29/19 8:50 AM, 06/21/17 8:55 AM Patient location: Pre-op. Preanesthetic checklist: patient identified, IV checked, site marked, risks and benefits discussed, surgical consent, monitors and equipment checked, pre-op evaluation, timeout performed and anesthesia consent Lidocaine 1% used for infiltration and patient sedated Hand hygiene performed  and maximum sterile barriers used  Catheter size: 8.5 Fr Sheath introducer Procedure performed using ultrasound guided technique. Ultrasound Notes:anatomy identified, needle tip was noted to be adjacent to the nerve/plexus identified, no ultrasound evidence of intravascular and/or intraneural injection and image(s) printed for medical record Attempts: 1 Following insertion, line sutured and dressing applied. Post procedure assessment: blood return through all ports, free fluid flow and no air  Patient tolerated the procedure well with no immediate complications.

## 2017-03-26 NOTE — Brief Op Note (Signed)
02/15/2017 - 03/08/2017  3:48 PM  PATIENT:  [name]  62 y.o. male  PRE-OPERATIVE DIAGNOSIS:  HEART FAILURE  POST-OPERATIVE DIAGNOSIS:  HEART FAILURE  PROCEDURE:  Procedure(s) with comments: INSERTION OF IMPLANTABLE LEFT VENTRICULAR ASSIST DEVICE (N/A) - HM3 AORTIC VALVE REPAIR (N/A) TRANSESOPHAGEAL ECHOCARDIOGRAM (TEE) (N/A)  SURGEON:  Surgeon(s) and Role:    Kerin Perna* Van Trigt, Peter, MD - Primary  PHYSICIAN ASSISTANT: Jilliana Burkes PA-C  ANESTHESIA:   general  EBL:  See anest /perfusion records   BLOOD ADMINISTERED: DRAINS: MEDIASTINAL, PLEURAL AND POCKET DRAINS   LOCAL MEDICATIONS USED:  NONE  SPECIMEN:  Source of Specimen:  LEFT VENTRICLE SAMPLE  DISPOSITION OF SPECIMEN:  PATHOLOGY  COUNTS:  YES  TOURNIQUET:  * No tourniquets in log *  DICTATION: .Other Dictation: Dictation Number [id number]  PLAN OF CARE: Admit to inpatient   PATIENT DISPOSITION:  ICU - intubated and hemodynamically stable.   Delay start of Pharmacological VTE agent (>24hrs) due to surgical blood loss or risk of bleeding: yes  COMPLICATIONS: NO KNOWN

## 2017-03-26 NOTE — Progress Notes (Signed)
RT note-Patient taken to 2H on ventilator from OR

## 2017-03-26 NOTE — Transfer of Care (Signed)
Immediate Anesthesia Transfer of Care Note  Patient: [name]  Procedure(s) Performed: INSERTION OF IMPLANTABLE LEFT VENTRICULAR ASSIST DEVICE (N/A Chest) AORTIC VALVE REPAIR (N/A ) TRANSESOPHAGEAL ECHOCARDIOGRAM (TEE) (N/A )  Patient Location: ICU  Anesthesia Type:General  Level of Consciousness: sedated and Patient remains intubated per anesthesia plan  Airway & Oxygen Therapy: Patient remains intubated per anesthesia plan and Patient placed on Ventilator (see vital sign flow sheet for setting)  Post-op Assessment: Report given to RN, Post -op Vital signs reviewed and unstable, Anesthesiologist notified and Dr. Noreene LarssonJoslin at bedside to discuss with ICU RNs plan of care for patient. VAD coordinator at bedside.  Flows remained 0LPM.    Post vital signs: unstable  Last Vitals:  Vitals:   03/18/2017 0938 03/23/2017 0939  BP:    Pulse: 80 81  Resp: 17 17  Temp:    SpO2: 97% 97%    Last Pain:  Vitals:   02/23/2017 0450  TempSrc: Oral  PainSc:       Patients Stated Pain Goal: 0 (03/06/17 0740)  Complications: No apparent anesthesia complications

## 2017-03-26 NOTE — Anesthesia Preprocedure Evaluation (Addendum)
Anesthesia Evaluation  Patient identified by MRN, date of birth, ID band Patient awake    Reviewed: Allergy & Precautions, NPO status , Patient's Chart, lab work & pertinent test results  Airway Mallampati: II  TM Distance: >3 FB     Dental  (+) Teeth Intact, Dental Advisory Given   Pulmonary former smoker,    breath sounds clear to auscultation       Cardiovascular hypertension,  Rhythm:Regular Rate:Normal     Neuro/Psych    GI/Hepatic   Endo/Other    Renal/GU      Musculoskeletal   Abdominal   Peds  Hematology   Anesthesia Other Findings   Reproductive/Obstetrics                            Anesthesia Physical Anesthesia Plan  ASA: IV  Anesthesia Plan: General   Post-op Pain Management:    Induction: Intravenous  PONV Risk Score and Plan: Ondansetron and Dexamethasone  Airway Management Planned: Oral ETT  Additional Equipment: Arterial line, CVP, PA Cath, 3D TEE and Ultrasound Guidance Line Placement  Intra-op Plan:   Post-operative Plan: Post-operative intubation/ventilation  Informed Consent: I have reviewed the patients History and Physical, chart, labs and discussed the procedure including the risks, benefits and alternatives for the proposed anesthesia with the patient or authorized representative who has indicated his/her understanding and acceptance.   Dental advisory given  Plan Discussed with: CRNA and Anesthesiologist  Anesthesia Plan Comments:         Anesthesia Quick Evaluation

## 2017-03-26 NOTE — Final Progress Note (Signed)
Anesthesiology Note  Patient developed severe pulmonary edema following LVAD insertion in OR. Despite every effort made to optimize vent settings and treat pulmonary edema, we were unable to ventilate and oxygenate the patient effectively. An ICU ventilator was brought into the OR and multiple ventilatory methods were tried without success.Last ABG in OR on FiO2 1.0 RR-24 TV 550 PEEP 8  PH 7.13 PCO2 76 PO2 35 O2 Sat 47%. Patient developed progressive hypotension and loss of pacemaker capture.  Decision was made that further resusitative efforts were futile. The patient was taken to SICU and pronounced dead shortly after arrival.   [name] [name]

## 2017-03-26 NOTE — Progress Notes (Signed)
  Echocardiogram Echocardiogram Transesophageal has been performed.  [name], [name] 03/07/2017, 11:18 AM

## 2017-03-26 NOTE — Progress Notes (Addendum)
Because of delay in starting surgery patient examined  preop holding area He is stable without complaint He understands we are proceeding with permanent implantable LVAD-HeartMate 3. He has no new questions regarding the procedure. Patient is pacemaker dependent and permanent pacemaker functioning.   Kathlee Nationseter Van Trigt III 02/28/2017

## 2017-03-26 NOTE — OR Nursing (Signed)
16:00 - 45 minute call to SICU

## 2017-03-26 NOTE — Progress Notes (Signed)
CSW met with family in the waiting room to provide supportive intervention as patient in the OR for LVAD implant. Patient's daughters and sister present and verbalize understanding of procedure and state they have received updates from the VAD Coordinator in the OR. CSW continues to follow for supportive needs throughout implant hospitalization. Raquel Sarna, [place], [place]

## 2017-03-26 NOTE — Progress Notes (Signed)
RT note-Called for patient to be placed on PCV due to inability to ventilate adequately.

## 2017-03-26 NOTE — Op Note (Signed)
NAMBascom Levels:  Molner, Vera                 ACCOUNT NO.:  [id number]  MEDICAL RECORD NO.:  [id number]  LOCATION:  [id number]                        FACILITY:  [facility]  PHYSICIAN:  Kerin PernaPeter Van Trigt, M.D.  DATE OF BIRTH:  1955-10-27  DATE OF PROCEDURE:  03/07/2017 DATE OF DISCHARGE:                              OPERATIVE REPORT   OPERATION: 1. Aortic valve repair-aortic valve leaflet closure for preoperative     severe aortic insufficiency. 2. Placement of HeartMate 3 left ventricular assist device with left     ventricular apical and descending aortic anastomoses.  PREOPERATIVE DIAGNOSIS:  Mixed ischemic-nonischemic cardiomyopathy, advanced class 4 heart failure, ejection fraction of 20% with severe mitral regurgitation and severe pulmonary hypertension.  POSTOPERATIVE DIAGNOSIS:  Mixed ischemic-nonischemic cardiomyopathy, advanced class 4 heart failure, ejection fraction of 20% with severe mitral regurgitation and severe pulmonary hypertension.  ASSISTANT:  Rowe ClackWayne E. Gold, PA-C.  ANESTHESIA:  General by Dr. Noreene LarssonJoslin.  CLINICAL NOTE:  The patient is a 62 year old reformed smoker with a long history of coronary artery disease and remote anterior MI with subsequent left ventricular remodeling, severe dilatation and severe mitral regurgitation.  The patient has been followed by the Advanced Heart Failure Service for approximately 2 years.  He remained stable on oral medication until several months earlier.  He had been treated with cardiac resynchronization pacing therapy with some clinical improvement, however, he continued to be readmitted with heart failure.  A recent right heart cath showed significant deterioration in his hemodynamics with low cardiac output, PA pressures of 85/50 and low mixed venous saturation of 50%.  He was admitted and placed on milrinone.  He was evaluated by the Cardiac Mechanical Support Service for implantable left ventricular assist device.  He was screened by the  Social Worker, Cardiac Surgery, Heart Failure Cardiology, and Palliative Care.  He was felt to be an acceptable candidate for implantation of a left ventricular assist device for destination therapy indication.  He understood that the pump was being placed to improve his survival as well as improved quality of life.  Prior to surgery, I discussed the benefits and risks of a HeartMate 3 left ventricular assist device.  He understood the risks include stroke, bleeding, blood transfusion requirement, postoperative pulmonary problems requiring prolonged intubation, postoperative organ failure, postoperative infection, and death.  After reviewing these issues, he demonstrated his understanding and agreed to proceed with surgery under what I felt was an informed consent.  OPERATIVE FINDINGS: 1. Despite in-hospital aggressive medical therapy with milrinone and     diuretics, his PA pressures were still close to systemic in the     operating room prior to surgery. 2. Severe right-sided enlargement and severe mitral regurgitation.     There was only mild tricuspid regurgitation on preoperative echo. 3. Acute lung injury after cardiopulmonary bypass related to his     pulmonary vascular disease with heavy secretions from the     endotracheal tube, which were persistent and filled the ventilator     circuit on several occasions requiring replacement after separation     of cardiopulmonary bypass. 4. Normal functioning of the implanted HeartMate 3 after separation     from cardiopulmonary  bypass with normal hemodynamics, which showed     postoperative hypoxemia, affected RV function and this developed a     low pump flow.  DESCRIPTION OF PROCEDURE:  The patient was brought to the operative room and placed supine on the operating table.  General anesthesia was induced.  The patient was prepped and draped as a sterile field.  A transesophageal echo probe was placed by the Anesthesia team.   This confirmed the preoperative diagnosis of severe LV dysfunction, severe MR and mild-moderate RV dysfunction with mild TR.  A proper time-out was performed.  A sternal incision was made and the sternum was opened with the oscillating saw.  A small incision was made above the umbilicus of the left mid abdomen for placement of the power cord and an exit site beneath the left costal margin was also placed to exit the power cord.  The pericardium was opened.  The RV was massive.  Pulmonary artery was very tight from the pulmonary hypertension.  Pursestrings were placed in the ascending aorta and right atrium and heparin was administered.  When the ACT was documented as being therapeutic, the patient was cannulated and placed on cardiopulmonary bypass.  A left ventricular vent was placed via the right superior pulmonary vein and cardioplegia cannulas were placed both antegrade and retrograde cold blood cardioplegia.  The patient was cooled to 32 degrees.  The aortic crossclamp was applied and 1 L of cold blood cardioplegia was delivered in split doses between the antegrade aortic and retrograde coronary sinus catheters.  There was good cardioplegic arrest and septal temperature dropped less than 14 degrees. Cardioplegia was delivered every 20 minutes.  A transverse aortotomy was performed.  Aortic valve was exposed.  It had 3 leaflets.  These were thin myxomatous.  They also had areas of fenestration in the noncoronary cusp.  The valve could not be repaired. Because of his need for right ventricular assist device and significant AI, the aortic valve was then closed using pledgeted 4-0 Prolene sutures placed along each leaflet edge from the central portion of the aortic valve to the commissures.  A final pursestring at the central coaptation point was then placed with a running 4-0 Prolene to completely close the valve.  The aortotomy was then closed in 2 layers using running 4-0 Prolene.   A light layer of medical adhesive-Coseal was placed on the incision.  The crossclamp was removed.  CO2 was insufflated into the operative field for this portion of the operation and for the implantation of the HeartMate 3 assist device.  Attention was then directed to the LV apex for the inflow cannula.  The coring knife was used to remove an apical core at the appropriate site. The LV wall was very thinned out and fibrotic.  There was no thrombus in the LV chamber after removing the apex core.  Next, the 2-0 Ethibond pledgeted sutures for the VAD were placed at the LV apex in a mattress suture configuration.  The sewing ring was then brought to the field and the sutures were placed through the sewing ring, and the sewing ring was seated and sutures were tied.  A medical adhesive was then placed carefully around the inflow ring.  The HeartMate 3 pump was then brought to the field.  After filling the heart while the patient was in steep Trendelenburg, the inflow cannula was placed in the LV chamber and the mechanism was locked and properly engaged.  Volume was then removed from the left  ventricle as a clamp was placed in the outflow graft.  The driveline was then tunneled from the mediastinum to the abdominal counter incision and then exited the left subcostal incision.  The pump was then placed into the pericardium and into the small pocket where the left hemidiaphragm had been partially taken down.  There were good positioning and placement of the heart pump in the pericardium.  Next, the outflow graft was brought around the venous cannula and cut to the appropriate length and beveled for the outflow graft anastomosis.  Next, a partial occluding clamp was placed on the ascending aorta and aortotomy was performed.  The outflow graft was then anastomosed to the ascending aorta with 4-0 running Prolene sutures.  Prior to releasing the clamp on the outflow graft, air was vented from  the graft using a 20- gauge needle.  There was good hemostasis in the outflow graft anastomosis.  The patient was then rewarmed.  The patient was placed on low-dose inotrope's and inhaled nitric oxide.  As the lungs were expanded, ventilator was resumed.  The patient was then prepared to come off cardiopulmonary bypass.  The HeartMate 3 pump was turned on, as the outflow graft clamp was removed at 4000 rpm.  The transition from full cardiopulmonary bypass to full support by the HeartMate 3 pump without difficulty.  Hemodynamics remained stable.  The venous cannula was removed.  Echo showed good position of the inflow cannula and the septum was midline.  HeartMate 3 pump flow was 4 L and PA pressures were significantly improved from preoperative pulmonary hypertension.  RV function was satisfactory and cardiac output was 4 L.  The patient received protamine and heparin was reversed.  Hemodynamics were stable.  Mediastinum was irrigated.  Hemostasis was achieved after treating the patient with some FFP and 1 unit of platelets.  While preparing to close the chest, the patient's oxygenation started to deteriorate.  This required several maneuvers by the Anesthesia team with recruitment and changing the ventilator settings.  The maneuvers did not show improvement and oxygen saturation continued to be low.  A ventilator from the ICU was brought for better mechanical ventilation strategies.  The patient's endotracheal tube had significant white cloudy fluid, which filled the circuit and required changing the ventilator circuit several times.  This was despite significantly improved pulmonary artery pressures from preoperative levels with PAD's less than 20.  I elected not to close the sternum because of the high tidal volumes required to oxygenate the patient.  Drains were placed in the mediastinum and left pleural space.  The pectoralis fascia and soft tissues were closed over the heart  and HeartMate 3 pump.  The skin was closed with staples.  The HeartMate 3 pump functioned well despite poor oxygenation and pO2 levels measured in the mid 40s.  The patient was then transported to the ICU to try to provide better ventilator support and oxygenation.  Total cardiopulmonary bypass time was 155 minutes.     Kerin Perna, M.D.     PV/MEDQ  D:  03/13/2017  T:  03/14/2017  Job:  [id number]

## 2017-03-26 NOTE — Progress Notes (Addendum)
Pt arrived from OR assisted with RT, CRNA, Anesthesiologist, & VAD Coordinator.  Prognosis communicated to RN from OR team. [name] at bedside. Orders received to discontinue pacemaker and vasoactive drips.  Asystole on monitor. Two RNs ascultated for heart sounds; No heart sounds auscultated.  Strip printed for chart. Dr. Donata ClayVan Trigt and CSW with family.

## 2017-03-26 NOTE — Brief Op Note (Signed)
02/27/2017  7:21 PM  PATIENT:  [name]  62 y.o. male  PRE-OPERATIVE DIAGNOSIS:  HEART FAILURE  POST-OPERATIVE DIAGNOSIS:  HEART FAILURE  PROCEDURE:  Procedure(s) with comments: INSERTION OF IMPLANTABLE LEFT VENTRICULAR ASSIST DEVICE (N/A) - HM3 AORTIC VALVE REPAIR (N/A) TRANSESOPHAGEAL ECHOCARDIOGRAM (TEE) (N/A)  SURGEON:  Surgeon(s) and Role:    Kerin Perna* Van Trigt, Peter, MD - Primary  PHYSICIAN ASSISTANT: Gershon CraneWayne Gold PA  ASSISTANTS:Marie Rande LawmanErwin RNFA  ANESTHESIA:   general  EBL:  300 cc  BLOOD ADMINISTERED:2 units CC PRBC 3 FFP  DRAINS: bilateral pleural and mediastinal Chest Tube(s) in the pleural spaces and mediastinum   LOCAL MEDICATIONS USED:  NONE  SPECIMEN:  Excision  DISPOSITION OF SPECIMEN:  PATHOLOGY  COUNTS:  YES  TOURNIQUET:  * No tourniquets in log *  DICTATION: .Dragon Dictation  PLAN OF CARE: admit to ICU  PATIENT DISPOSITION:  ICU - intubated and critically ill.   Delay start of Pharmacological VTE agent (>24hrs) due to surgical blood loss or risk of bleeding: yes

## 2017-03-26 DEATH — deceased

## 2017-04-13 ENCOUNTER — Encounter: Payer: Medicaid Other | Admitting: Internal Medicine

## 2017-06-24 ENCOUNTER — Encounter: Payer: Medicaid Other | Admitting: Internal Medicine

## 2017-12-11 IMAGING — DX DG CHEST 2V
3 series · 3 of 3 positions shown · non-contrast
Comparison: 7 days ago

CLINICAL DATA: Pacer removal

EXAM:
CHEST  2 VIEW

[x chest ap]
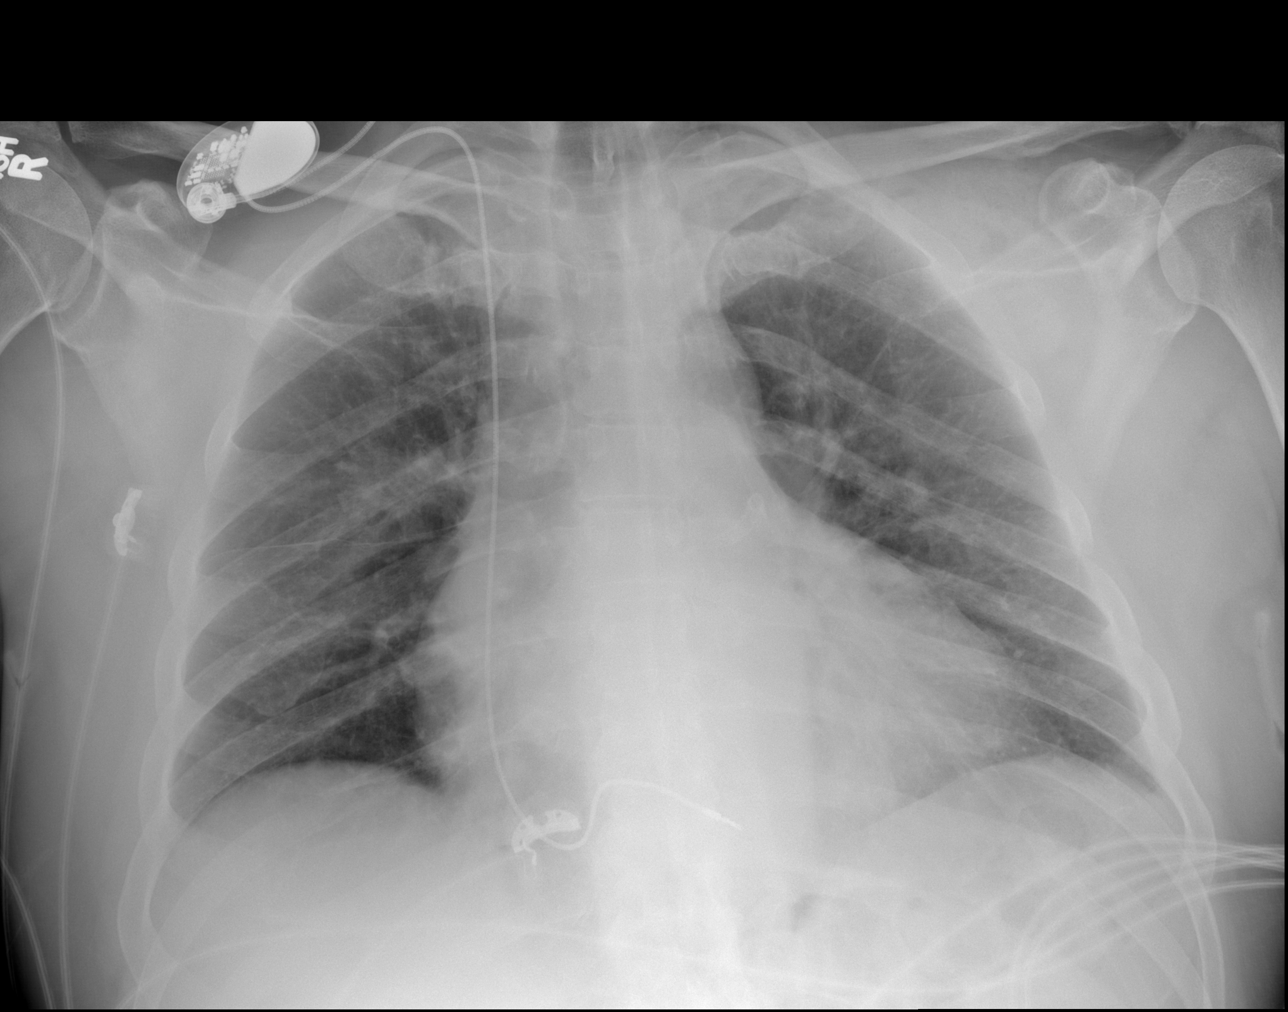

[w chest lat (1 of 2)]
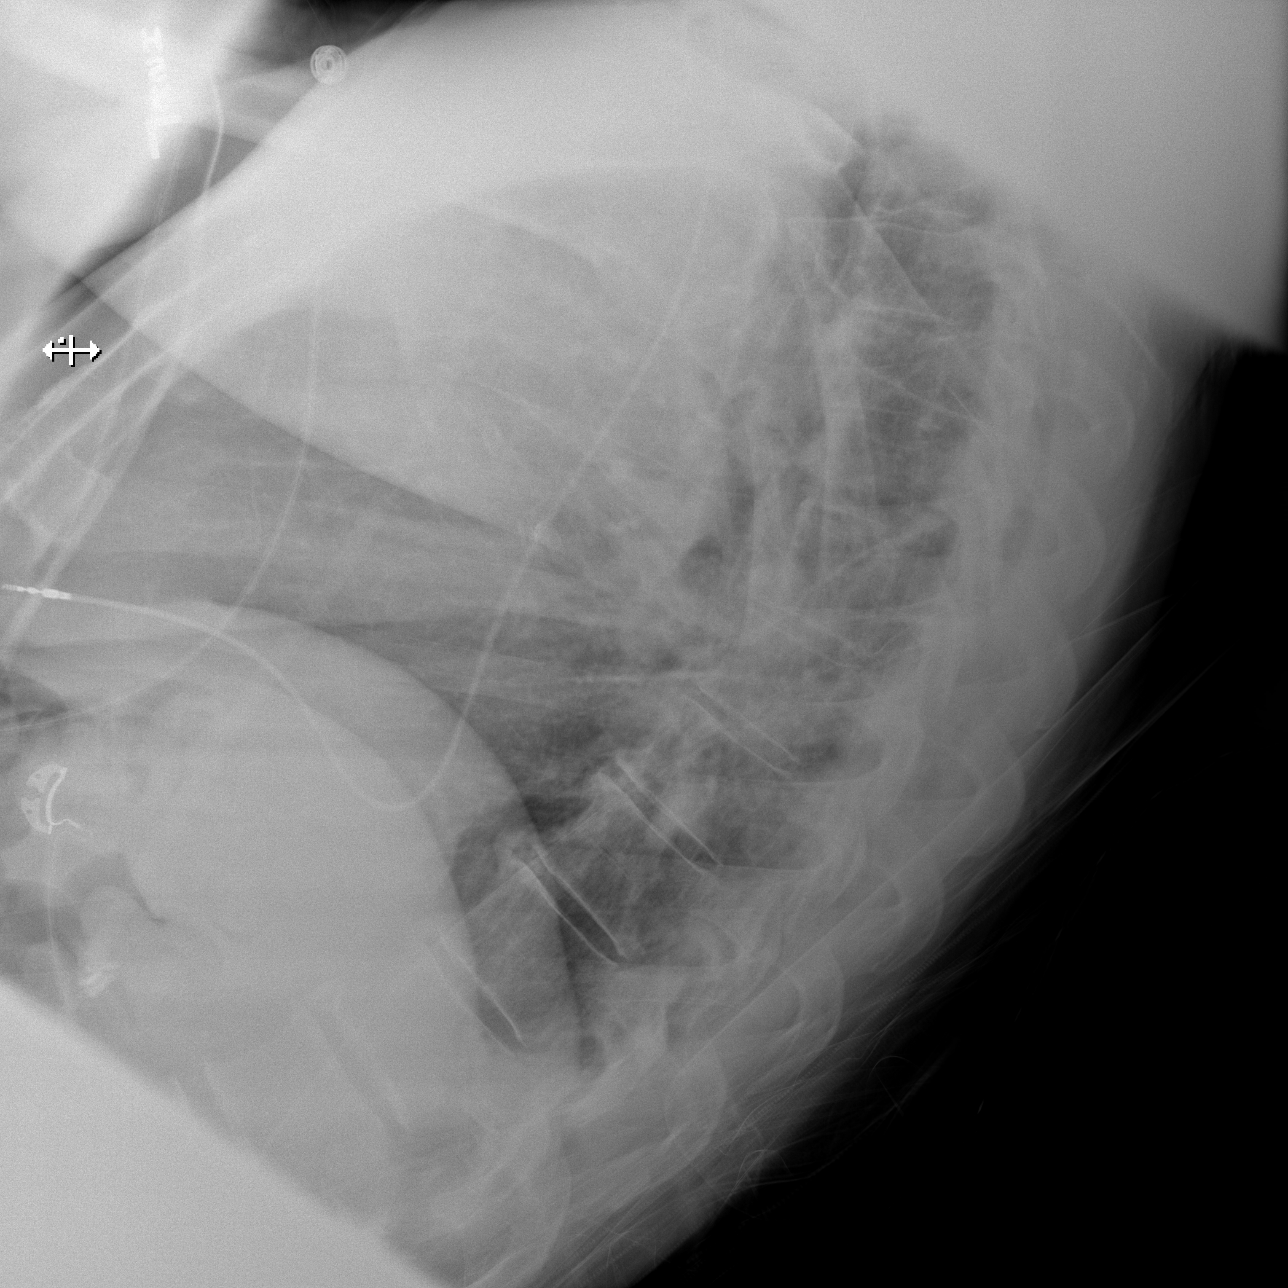

[w chest lat (2 of 2)]
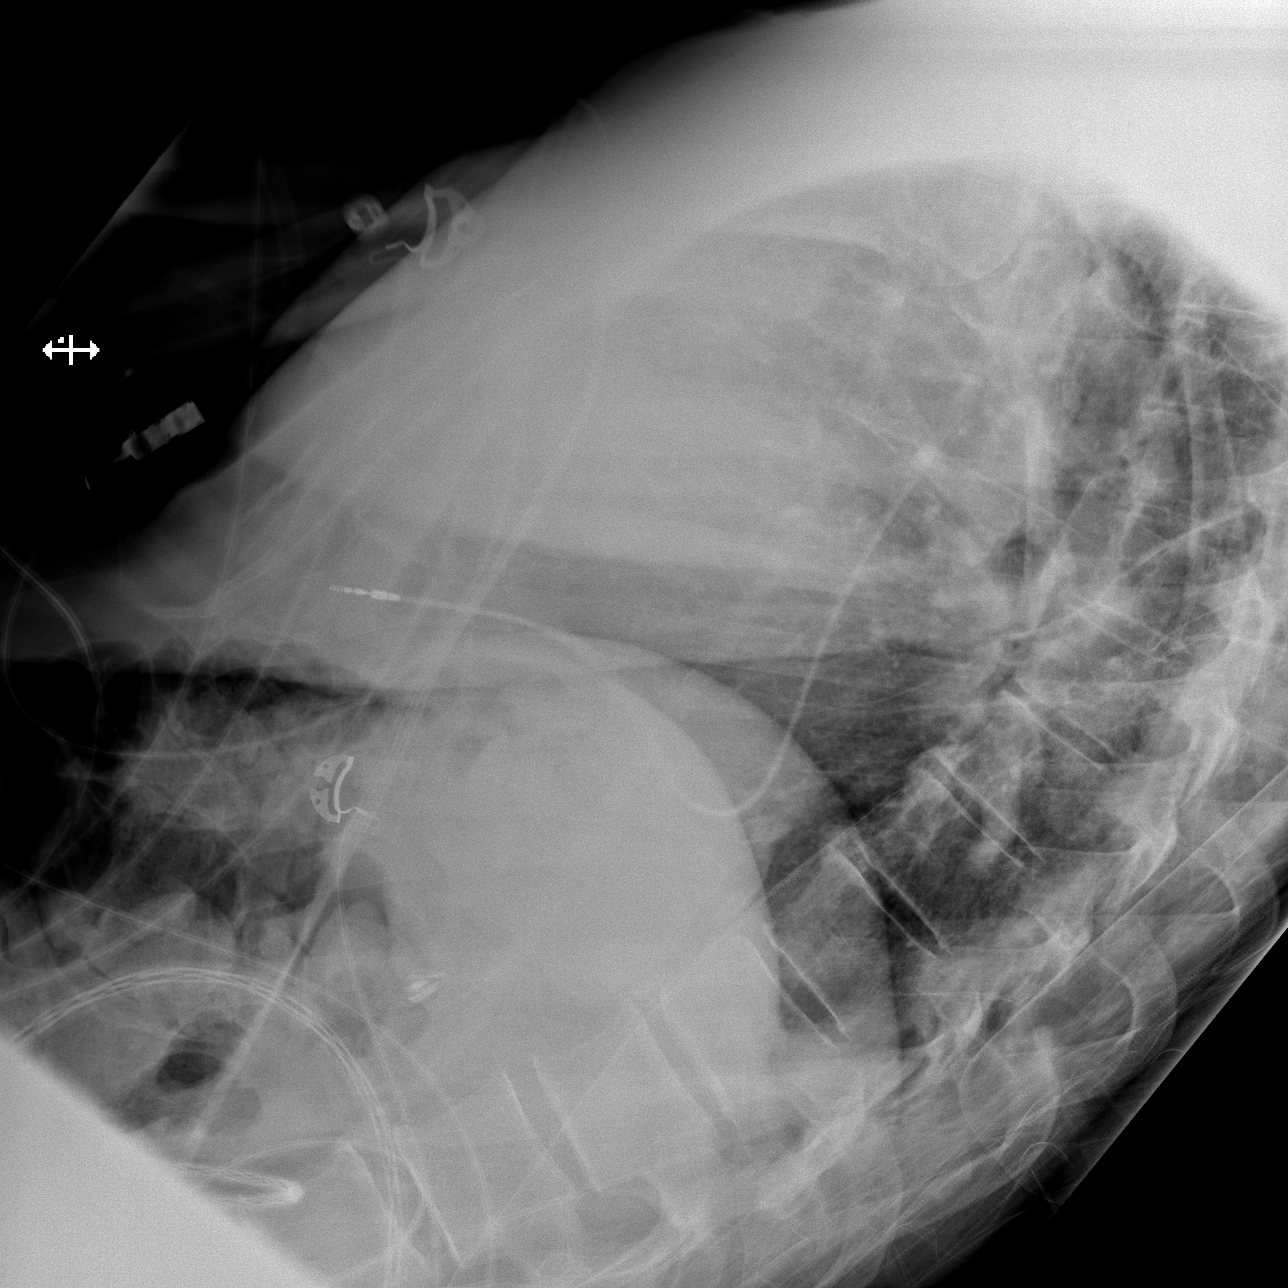

[3 of 3 positions shown; findings below may reference images not displayed]

FINDINGS: Single chamber pacer from the right with lead in the expected
location of the right ventricle. Left-sided pacer has been removed.
Cardiopericardial size is stable. Cardiomegaly and coronary stent.
Low volume chest without pneumothorax. Chest wall emphysema on the
left, presumably related to dissection of generator pack.
IMPRESSION: 1. Stable cardiopericardial size after left-sided pacer removal.
Left chest wall gas, no pneumothorax.
2. New right-sided pacer with right ventricular lead.
3. Low volume chest.

## 2018-11-20 IMAGING — DX DG CHEST 2V
2 series · 2 of 2 positions shown · non-contrast
Comparison: 02/24/2017.

CLINICAL DATA: Shortness of breath for weeks.  Preop LVAD.

EXAM:
CHEST  2 VIEW

[chest pa]
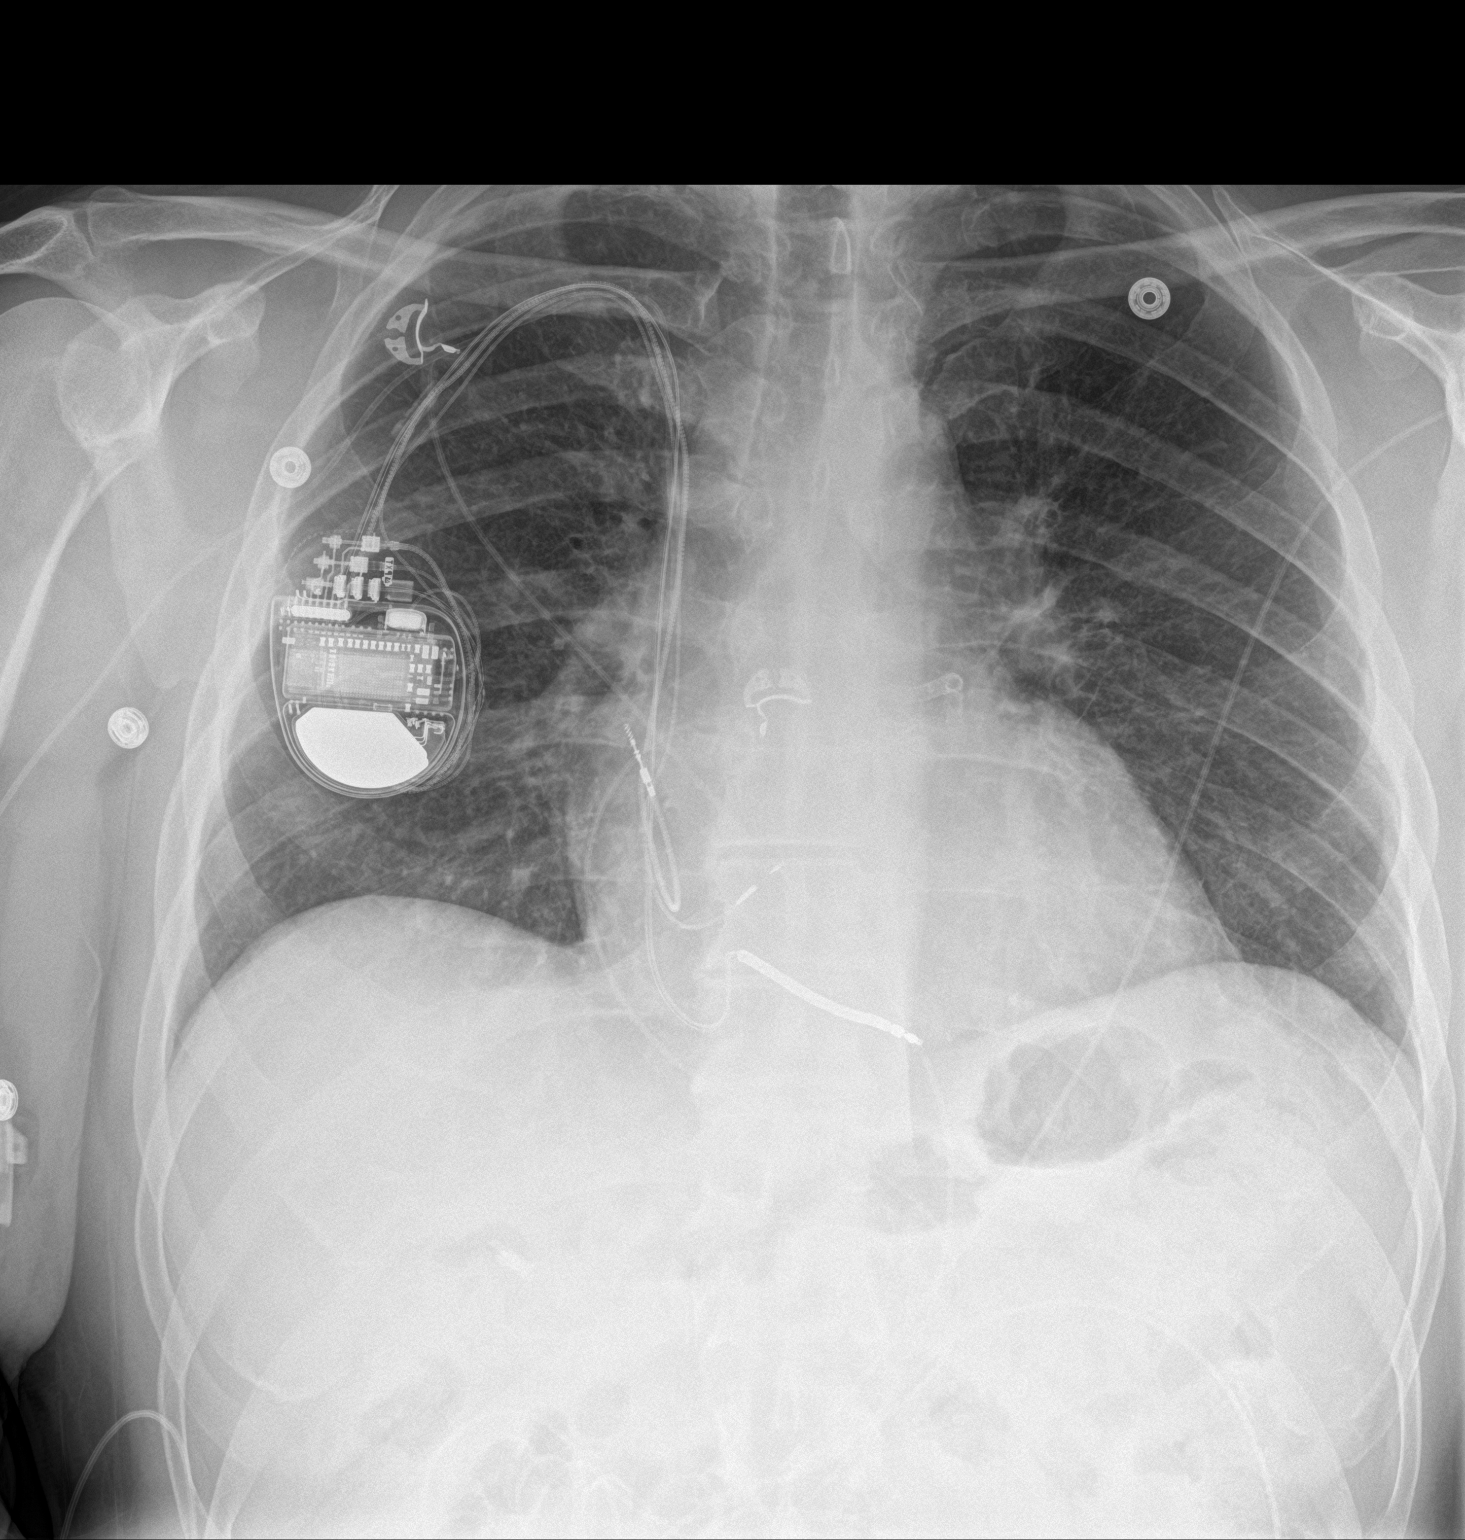

[chest lat]
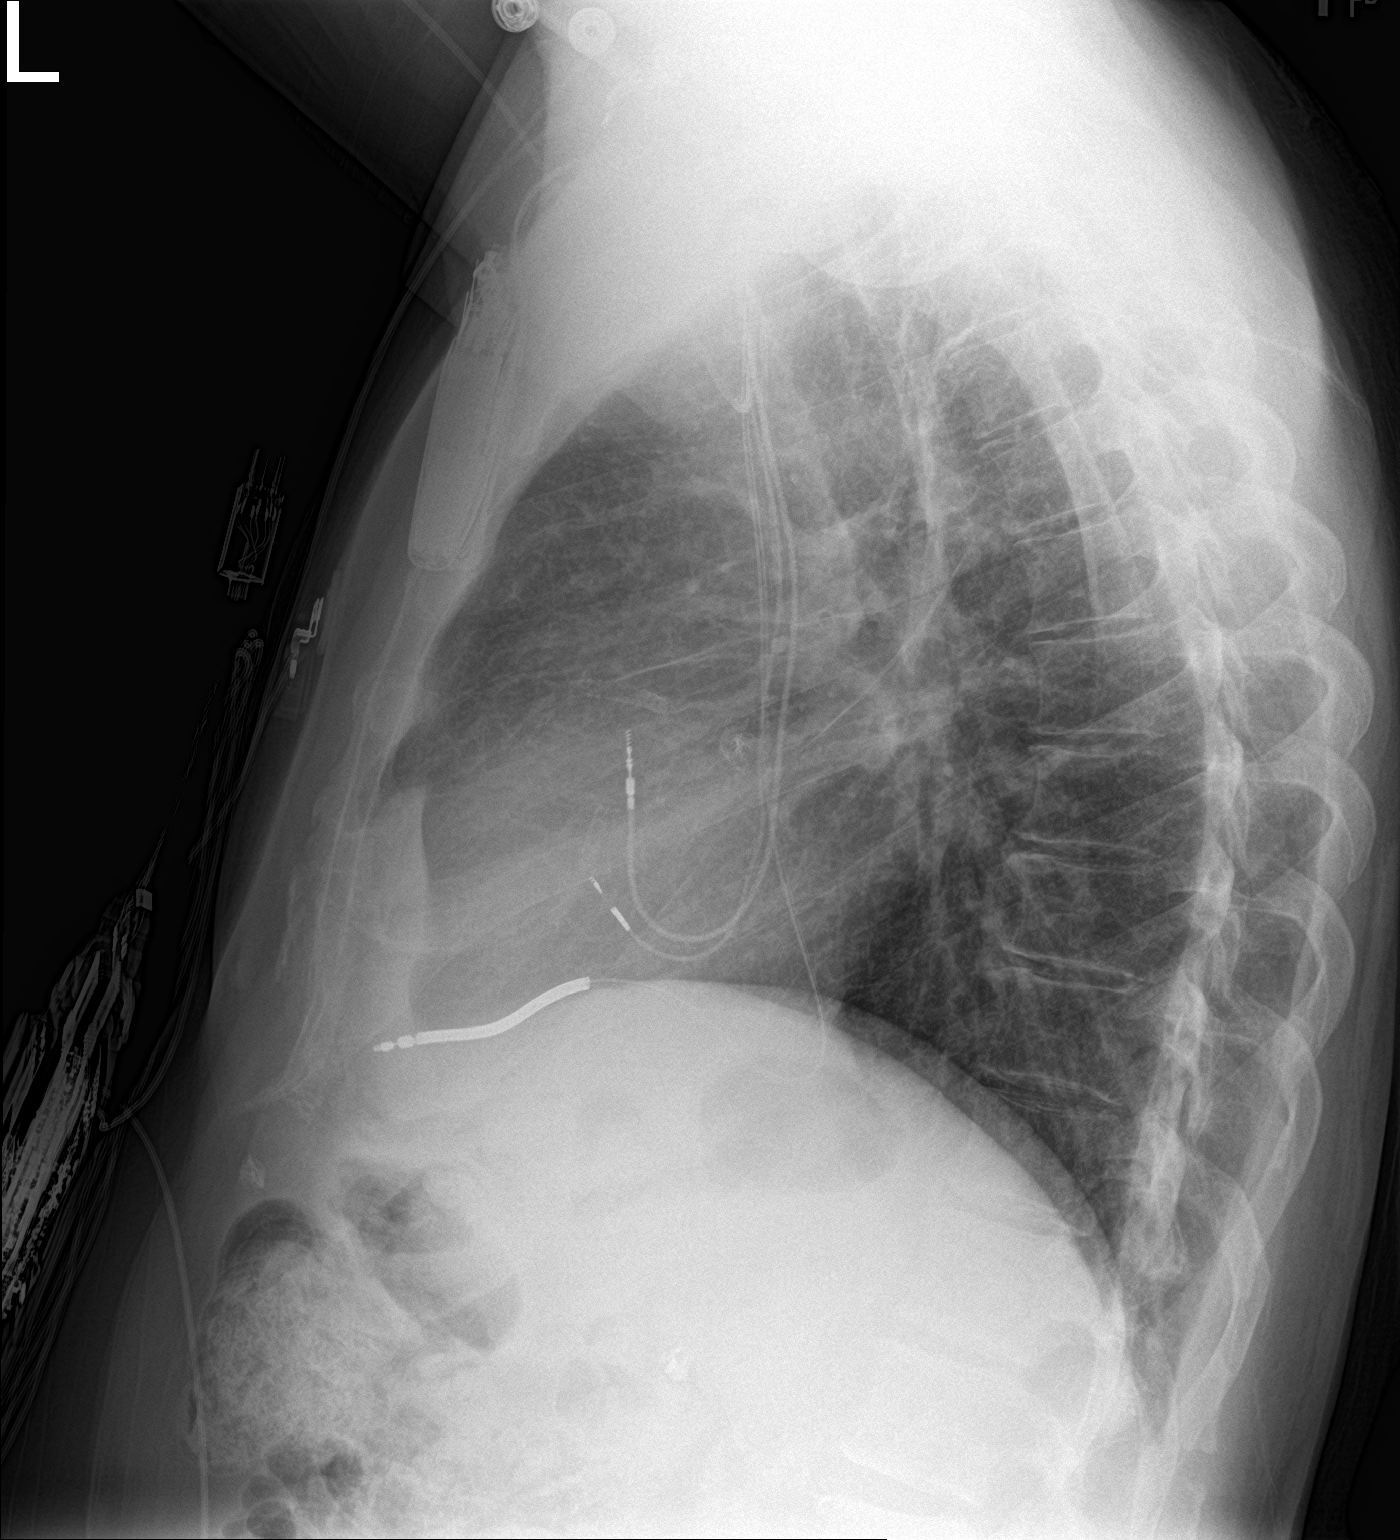

[2 of 2 positions shown; findings below may reference images not displayed]

FINDINGS: The heart is enlarged. RIGHT-sided pacer unchanged. No consolidation
or edema.
IMPRESSION: Cardiomegaly.  No definite active process.
